# Patient Record
Sex: Male | Born: 1945
Health system: Southern US, Community
[De-identification: ages and names within clinical notes are randomized; demographics above are authoritative.]

## PROBLEM LIST (undated history)

## (undated) DIAGNOSIS — F17200 Nicotine dependence, unspecified, uncomplicated: Secondary | ICD-10-CM

## (undated) DIAGNOSIS — M545 Low back pain, unspecified: Secondary | ICD-10-CM

## (undated) DIAGNOSIS — K5790 Diverticulosis of intestine, part unspecified, without perforation or abscess without bleeding: Secondary | ICD-10-CM

## (undated) DIAGNOSIS — J449 Chronic obstructive pulmonary disease, unspecified: Secondary | ICD-10-CM

## (undated) DIAGNOSIS — I351 Nonrheumatic aortic (valve) insufficiency: Secondary | ICD-10-CM

## (undated) DIAGNOSIS — Z8679 Personal history of other diseases of the circulatory system: Secondary | ICD-10-CM

## (undated) DIAGNOSIS — N400 Enlarged prostate without lower urinary tract symptoms: Secondary | ICD-10-CM

## (undated) DIAGNOSIS — I10 Essential (primary) hypertension: Secondary | ICD-10-CM

## (undated) DIAGNOSIS — H269 Unspecified cataract: Secondary | ICD-10-CM

## (undated) DIAGNOSIS — N529 Male erectile dysfunction, unspecified: Secondary | ICD-10-CM

## (undated) DIAGNOSIS — G629 Polyneuropathy, unspecified: Secondary | ICD-10-CM

## (undated) DIAGNOSIS — H5440 Blindness, one eye, unspecified eye: Secondary | ICD-10-CM

## (undated) HISTORY — DX: Male erectile dysfunction, unspecified: N52.9

## (undated) HISTORY — DX: Chronic obstructive pulmonary disease, unspecified: J44.9

## (undated) HISTORY — DX: Unspecified cataract: H26.9

## (undated) HISTORY — DX: Diverticulosis of intestine, part unspecified, without perforation or abscess without bleeding: K57.90

## (undated) HISTORY — DX: Personal history of other diseases of the circulatory system: Z86.79

## (undated) HISTORY — DX: Low back pain, unspecified: M54.50

## (undated) HISTORY — DX: Benign prostatic hyperplasia without lower urinary tract symptoms: N40.0

## (undated) HISTORY — DX: Low back pain: M54.5

## (undated) HISTORY — DX: Essential (primary) hypertension: I10

## (undated) HISTORY — DX: Nicotine dependence, unspecified, uncomplicated: F17.200

## (undated) HISTORY — PX: ELBOW SURGERY: SHX618

## (undated) HISTORY — DX: Nonrheumatic aortic (valve) insufficiency: I35.1

## (undated) HISTORY — DX: Blindness, one eye, unspecified eye: H54.40

## (undated) HISTORY — DX: Polyneuropathy, unspecified: G62.9

---

## 1983-05-25 DIAGNOSIS — H5440 Blindness, one eye, unspecified eye: Secondary | ICD-10-CM

## 1983-05-25 HISTORY — DX: Blindness, one eye, unspecified eye: H54.40

## 1998-05-24 HISTORY — PX: COLON SURGERY: SHX602

## 1998-07-26 ENCOUNTER — Inpatient Hospital Stay (HOSPITAL_COMMUNITY): Admission: EM | Admit: 1998-07-26 | Discharge: 1998-07-31 | Payer: Self-pay | Admitting: *Deleted

## 1998-07-26 ENCOUNTER — Encounter: Payer: Self-pay | Admitting: *Deleted

## 1998-07-27 ENCOUNTER — Encounter: Payer: Self-pay | Admitting: *Deleted

## 1998-07-29 ENCOUNTER — Encounter: Payer: Self-pay | Admitting: Surgery

## 1998-10-08 ENCOUNTER — Encounter: Payer: Self-pay | Admitting: Surgery

## 1998-10-10 ENCOUNTER — Inpatient Hospital Stay (HOSPITAL_COMMUNITY): Admission: RE | Admit: 1998-10-10 | Discharge: 1998-10-14 | Payer: Self-pay | Admitting: Surgery

## 2001-08-28 ENCOUNTER — Encounter: Admission: RE | Admit: 2001-08-28 | Discharge: 2001-08-28 | Payer: Self-pay | Admitting: Family Medicine

## 2001-08-28 ENCOUNTER — Encounter: Payer: Self-pay | Admitting: Family Medicine

## 2003-09-23 ENCOUNTER — Ambulatory Visit (HOSPITAL_COMMUNITY): Admission: RE | Admit: 2003-09-23 | Discharge: 2003-09-23 | Payer: Self-pay | Admitting: Family Medicine

## 2004-08-02 ENCOUNTER — Emergency Department (HOSPITAL_COMMUNITY): Admission: EM | Admit: 2004-08-02 | Discharge: 2004-08-02 | Payer: Self-pay | Admitting: Emergency Medicine

## 2004-08-05 ENCOUNTER — Ambulatory Visit (HOSPITAL_COMMUNITY): Admission: RE | Admit: 2004-08-05 | Discharge: 2004-08-05 | Payer: Self-pay | Admitting: Emergency Medicine

## 2006-05-18 ENCOUNTER — Emergency Department (HOSPITAL_COMMUNITY): Admission: EM | Admit: 2006-05-18 | Discharge: 2006-05-19 | Payer: Self-pay | Admitting: Emergency Medicine

## 2012-10-12 ENCOUNTER — Other Ambulatory Visit: Payer: Self-pay | Admitting: Gastroenterology

## 2012-12-27 ENCOUNTER — Ambulatory Visit (INDEPENDENT_AMBULATORY_CARE_PROVIDER_SITE_OTHER): Payer: Medicare Other | Admitting: Internal Medicine

## 2012-12-27 ENCOUNTER — Encounter: Payer: Self-pay | Admitting: Internal Medicine

## 2012-12-27 ENCOUNTER — Ambulatory Visit (INDEPENDENT_AMBULATORY_CARE_PROVIDER_SITE_OTHER)
Admission: RE | Admit: 2012-12-27 | Discharge: 2012-12-27 | Disposition: A | Discharge status: Home / Self Care | Payer: Medicare Other | Source: Ambulatory Visit | Attending: Internal Medicine | Admitting: Internal Medicine

## 2012-12-27 VITALS — BP 130/80 | HR 73 | Temp 98.0°F | Ht 70.0 in | Wt 173.8 lb

## 2012-12-27 DIAGNOSIS — J449 Chronic obstructive pulmonary disease, unspecified: Secondary | ICD-10-CM

## 2012-12-27 DIAGNOSIS — J441 Chronic obstructive pulmonary disease with (acute) exacerbation: Secondary | ICD-10-CM | POA: Insufficient documentation

## 2012-12-27 MED ORDER — BUDESONIDE-FORMOTEROL FUMARATE 160-4.5 MCG/ACT IN AERO: INHALATION_SPRAY | RESPIRATORY_TRACT | Status: DC

## 2012-12-27 NOTE — Patient Instructions (Addendum)
symbicort 160 Take 2 puffs first thing in am and then another 2 puffs about 12 hours later.   Only use your albuterol (proaire) as a rescue medication to be used if you can't catch your breath by resting or doing a relaxed purse lip breathing pattern. The less you use it, the better it will work when you need it.   As you improve ok to stop spiriva   Please remember to go to the  x-ray department downstairs for your tests - we will call you with the results when they are available.    See your dentist as soon as possible   Please schedule a follow up office visit in 6 weeks, call sooner if needed with pft's

## 2012-12-27 NOTE — Progress Notes (Signed)
  Subjective:    Patient ID: Joel Perez, male    DOB: May 17, 1946  MRN: [id number]  HPI  37 yowm quit smoking 12/2012 referred by Dr Azucena Cecil for eval of copd  12/27/2012 1st pulmonary eval/ Wert cc onset x 2009 am cough cough/ congestion and progresive doe with white mucus x sev tbsp x sev min better p proaire and maintained on spiriva which he feels doesn't work as well as advair. Doe x one flight steps, dragging the garbage to street stops x 2. He denies cough to me but actually has a rattling with cough maneuver but doesn't bring up much mucus, what he does bring up is mostly in ams and mucoid and some better since quit smoking   No obvious daytime variabilty  Or cp or chest tightness, subjective wheeze overt sinus or hb symptoms. No unusual exp hx or h/o childhood pna/ asthma or knowledge of premature birth.   Sleeping ok in recliner  without nocturnal  or early am exacerbation  of respiratory  c/o's or need for noct saba. Also denies any obvious fluctuation of symptoms with weather or environmental changes or other aggravating or alleviating factors except as outlined above    Review of Systems  Constitutional: Positive for appetite change. Negative for fever, chills, activity change and unexpected weight change.  HENT: Positive for congestion. Negative for sore throat, rhinorrhea, sneezing, trouble swallowing, dental problem, voice change and postnasal drip.   Eyes: Negative for visual disturbance.  Respiratory: Positive for cough and shortness of breath. Negative for choking.   Cardiovascular: Negative for chest pain and leg swelling.  Gastrointestinal: Negative for nausea, vomiting and abdominal pain.  Genitourinary: Negative for difficulty urinating.  Musculoskeletal: Negative for arthralgias.  Skin: Negative for rash.  Psychiatric/Behavioral: Negative for behavioral problems and confusion.       Objective:   Physical Exam   Wt Readings from Last 3 Encounters:  12/27/12 173 lb  12.8 oz (78.835 kg)   HEENT mild turbinate edema.  Extremely poor dentition  Oropharynx no thrush or excess pnd or cobblestoning.  No JVD or cervical adenopathy. Mild accessory muscle hypertrophy. Trachea midline, nl thryroid. Chest was hyperinflated by percussion with diminished breath sounds and moderate increased exp time without wheeze. Hoover sign positive at mid inspiration. Regular rate and rhythm without murmur gallop or rub or increase P2 or edema.  Abd: no hsm, nl excursion. Ext warm without cyanosis or clubbing.      CXR  12/27/2012 :  Small left pleural effusion or pleural scarring. Otherwise no acute cardiopulmonary abnormality      Assessment & Plan:

## 2012-12-28 NOTE — Assessment & Plan Note (Signed)
DDX of  difficult airways managment all start with A and  include Adherence, Ace Inhibitors, Acid Reflux, Active Sinus Disease, Alpha 1 Antitripsin deficiency, Anxiety masquerading as Airways dz,  ABPA,  allergy(esp in young), Aspiration (esp in elderly), Adverse effects of DPI,  Active smokers, plus two Bs  = Bronchiectasis and Beta blocker use..and one C= CHF  Adherence is always the initial "prime suspect" and is a multilayered concern that requires a "trust but verify" approach in every patient - starting with knowing how to use medications, especially inhalers, correctly, keeping up with refills and understanding the fundamental difference between maintenance and prns vs those medications only taken for a very short course and then stopped and not refilled. The proper method of use, as well as anticipated side effects, of a metered-dose inhaler are discussed and demonstrated to the patient. Improved effectiveness after extensive coaching during this visit to a level of approximately  90% so rec trial of symbicort 160 2bid   Active smoking > denies as of 12/25/12  I took an extended  opportunity with this patient to outline the consequences of continued cigarette use  in airway disorders based on all the data we have from the multiple national lung health studies (perfomed over decades at millions of dollars in cost)  indicating that maintaining smoking cessation, not choice of inhalers or physicians, is the most important aspect of care.

## 2012-12-28 NOTE — Progress Notes (Signed)
Quick Note:  Spoke with pt and notified of results per Dr. Wert. Pt verbalized understanding and denied any questions.  ______ 

## 2013-02-09 ENCOUNTER — Ambulatory Visit (INDEPENDENT_AMBULATORY_CARE_PROVIDER_SITE_OTHER): Payer: BC Managed Care – PPO | Admitting: Internal Medicine

## 2013-02-09 ENCOUNTER — Encounter: Payer: Self-pay | Admitting: Internal Medicine

## 2013-02-09 VITALS — BP 122/78 | HR 66 | Temp 97.8°F | Ht 69.0 in | Wt 177.0 lb

## 2013-02-09 DIAGNOSIS — J449 Chronic obstructive pulmonary disease, unspecified: Secondary | ICD-10-CM

## 2013-02-09 LAB — PULMONARY FUNCTION TEST

## 2013-02-09 MED ORDER — ALBUTEROL SULFATE HFA 108 (90 BASE) MCG/ACT IN AERS: 2.0000 | INHALATION_SPRAY | Freq: Four times a day (QID) | RESPIRATORY_TRACT | Status: DC | PRN

## 2013-02-09 NOTE — Progress Notes (Signed)
Subjective:    Patient ID: Joel Perez, male    DOB: 1946/04/27  MRN: [id number]    Brief patient profile:  27 yowm quit smoking 12/2012 referred by Dr Azucena Cecil for eval of copd with GOLD II criteria established 02/09/13    History of Present Illness  12/27/2012 1st pulmonary eval/ Wert cc onset x 2009 am cough cough/ congestion and progresive doe with white mucus x sev tbsp x sev min better p proaire and maintained on spiriva which he feels doesn't work as well as advair. Doe x one flight steps, dragging the garbage to street stops x 2. He denies cough to me but actually has a rattling with cough maneuver but doesn't bring up much mucus, what he does bring up is mostly in ams and mucoid and some better since quit smoking  rec symbicort 160 Take 2 puffs first thing in am and then another 2 puffs about 12 hours later.  Only use your albuterol (proaire) as a rescue medication to be used if you can't catch your breath by resting or doing a relaxed purse lip breathing pattern. The less you use it, the better it will work when you need it.  As you improve ok to stop spiriva  Please schedule a follow up office visit in 6 weeks, call sooner if needed with pft's   02/09/2013 f/u ov/Wert re: GOLD II with reversibilty Chief Complaint  Patient presents with  . Follow-up    W/ PFT. Pt reports his breathing is better. He gets up tan color phlem up right after breathing tx  still rattling some but much better.    No obvious daytime variabilty  Or cp or chest tightness, subjective wheeze overt sinus or hb symptoms. No unusual exp hx or h/o childhood pna/ asthma or knowledge of premature birth.   Sleeping ok in recliner  without nocturnal  or early am exacerbation  of respiratory  c/o's or need for noct saba. Also denies any obvious fluctuation of symptoms with weather or environmental changes or other aggravating or alleviating factors except as outlined above   Current Medications, Allergies, Complete  Past Medical History, Past Surgical History, Family History, and Social History were reviewed in [employer] record.  ROS  The following are not active complaints unless bolded sore throat, dysphagia, dental problems, itching, sneezing,  nasal congestion or excess/ purulent secretions, ear ache,   fever, chills, sweats, unintended wt loss, pleuritic or exertional cp, hemoptysis,  orthopnea pnd or leg swelling, presyncope, palpitations, heartburn, abdominal pain, anorexia, nausea, vomiting, diarrhea  or change in bowel or urinary habits, change in stools or urine, dysuria,hematuria,  rash, arthralgias, visual complaints, headache, numbness weakness or ataxia or problems with walking or coordination,  change in mood/affect or memory.             Objective:   Physical Exam Wt Readings from Last 3 Encounters:  02/09/13 177 lb (80.287 kg)  12/27/12 173 lb 12.8 oz (78.835 kg)      HEENT mild turbinate edema.  Extremely poor dentition  Oropharynx no thrush or excess pnd or cobblestoning.  No JVD or cervical adenopathy. Mild accessory muscle hypertrophy. Trachea midline, nl thryroid. Chest was hyperinflated by percussion with diminished breath sounds and moderate increased exp time without wheeze. Hoover sign positive at mid inspiration. Regular rate and rhythm without murmur gallop or rub or increase P2 or edema.  Abd: no hsm, nl excursion. Ext warm without cyanosis or clubbing.  CXR  12/27/2012 :  Small left pleural effusion or pleural scarring. Otherwise no acute cardiopulmonary abnormality      Assessment & Plan:

## 2013-02-09 NOTE — Patient Instructions (Addendum)
Continue symbicort 160 Take 2 puffs first thing in am and then another 2 puffs about 12 hours later.   Only use your proair as a rescue medication to be used if you can't catch your breath by resting or doing a relaxed purse lip breathing pattern. The less you use it, the better it will work when you need it.  Don't leave home without it  Ok to leave off spiriva    If you are satisfied with your treatment plan let your doctor know and he/she can either refill your medications or you can return here when your prescription runs out.     If in any way you are not 100% satisfied,  please tell us.  If 100% better, tell your friends!

## 2013-02-09 NOTE — Progress Notes (Signed)
PFT done today. 

## 2013-02-11 NOTE — Assessment & Plan Note (Addendum)
quit smoking 12/2012 - 02/09/2013  FEV1 2.14 (68%) ratio 60 p 30% better from B2 and dloc 52 corrects to 69  The proper method of use, as well as anticipated side effects, of a metered-dose inhaler are discussed and demonstrated to the patient. Improved effectiveness after extensive coaching during this visit to a level of approximately  90% so should do well on just symbicort 160 2bid but ok to add or subtract spiriva if persistent limiting sob .  I reviewed the Flethcher curve with patient that basically indicates  if you quit smoking when your best day FEV1 is still well preserved (which his clearly is)  it is highly unlikely you will progress to severe disease and informed the patient there was no medication on the market that has proven to change the curve or the likelihood of progression.  Therefore  maintaining smoking abstinence at this point is the most important aspect of his care, not choice of inhalers or for that matter, doctors.    Pulmonary f/u is therefore prn

## 2013-12-22 DIAGNOSIS — I351 Nonrheumatic aortic (valve) insufficiency: Secondary | ICD-10-CM

## 2013-12-22 HISTORY — DX: Nonrheumatic aortic (valve) insufficiency: I35.1

## 2013-12-27 ENCOUNTER — Other Ambulatory Visit: Payer: Self-pay | Admitting: Family Medicine

## 2013-12-27 DIAGNOSIS — F17200 Nicotine dependence, unspecified, uncomplicated: Secondary | ICD-10-CM

## 2014-01-15 ENCOUNTER — Ambulatory Visit
Admission: RE | Admit: 2014-01-15 | Discharge: 2014-01-15 | Disposition: A | Discharge status: Home / Self Care | Payer: Medicare Other | Source: Ambulatory Visit | Attending: Family Medicine | Admitting: Family Medicine

## 2014-01-15 ENCOUNTER — Other Ambulatory Visit: Payer: Self-pay | Admitting: Internal Medicine

## 2014-01-15 DIAGNOSIS — F17200 Nicotine dependence, unspecified, uncomplicated: Secondary | ICD-10-CM

## 2014-01-18 ENCOUNTER — Encounter (INDEPENDENT_AMBULATORY_CARE_PROVIDER_SITE_OTHER): Payer: Self-pay

## 2014-01-18 ENCOUNTER — Ambulatory Visit (INDEPENDENT_AMBULATORY_CARE_PROVIDER_SITE_OTHER): Payer: Medicare Other | Admitting: Internal Medicine

## 2014-01-18 ENCOUNTER — Encounter: Payer: Self-pay | Admitting: Internal Medicine

## 2014-01-18 ENCOUNTER — Ambulatory Visit (INDEPENDENT_AMBULATORY_CARE_PROVIDER_SITE_OTHER)
Admission: RE | Admit: 2014-01-18 | Discharge: 2014-01-18 | Disposition: A | Discharge status: Home / Self Care | Payer: Medicare Other | Source: Ambulatory Visit | Attending: Internal Medicine | Admitting: Internal Medicine

## 2014-01-18 VITALS — BP 134/72 | HR 75 | Temp 98.0°F | Ht 68.0 in | Wt 179.0 lb

## 2014-01-18 DIAGNOSIS — F17200 Nicotine dependence, unspecified, uncomplicated: Secondary | ICD-10-CM

## 2014-01-18 DIAGNOSIS — J449 Chronic obstructive pulmonary disease, unspecified: Secondary | ICD-10-CM

## 2014-01-18 DIAGNOSIS — F17210 Nicotine dependence, cigarettes, uncomplicated: Secondary | ICD-10-CM

## 2014-01-18 DIAGNOSIS — J4489 Other specified chronic obstructive pulmonary disease: Secondary | ICD-10-CM

## 2014-01-18 NOTE — Patient Instructions (Addendum)
Symbicort 160 Take 2 puffs first thing in am and then another 2 puffs about 12 hours later.   Only use your albuterol (proair)  as a rescue medication to be used if you can't catch your breath by resting or doing a relaxed purse lip breathing pattern.  - The less you use it, the better it will work when you need it. - Ok to use up to 2 puffs  every 4 hours if you must but call for immediate appointment if use goes up over your usual need - Don't leave home without it !!  (think of it like the spare tire for your car)   The key is to stop smoking completely before smoking completely stops you!   Please remember to go to the  x-ray department downstairs for your tests - we will call you with the results when they are available.

## 2014-01-18 NOTE — Progress Notes (Signed)
Subjective:    Patient ID: Joel Perez, male    DOB: 07/04/1945   MRN: [id number]    Brief patient profile:  6 yowm active smoker referred by Dr Moreen Fowler for eval of copd with GOLD II criteria established 02/09/13    History of Present Illness  12/27/2012 1st pulmonary eval/ Wert cc onset x 2009 am cough cough/ congestion and progresive doe with white mucus x sev tbsp x sev min better p proaire and maintained on spiriva which he feels doesn't work as well as advair. Doe x one flight steps, dragging the garbage to street stops x 2. He denies cough to me but actually has a rattling with cough maneuver but doesn't bring up much mucus, what he does bring up is mostly in ams and mucoid and some better since quit smoking  rec symbicort 160 Take 2 puffs first thing in am and then another 2 puffs about 12 hours later.  Only use your albuterol (proaire) as a rescue medication to be used if you can't catch your breath by resting or doing a relaxed purse lip breathing pattern. The less you use it, the better it will work when you need it.  As you improve ok to stop spiriva  Please schedule a follow up office visit in 6 weeks, call sooner if needed with pft's   02/09/2013 f/u ov/Wert re: GOLD II with reversibilty Chief Complaint  Patient presents with  . Follow-up    W/ PFT. Pt reports his breathing is better. He gets up tan color phlem up right after breathing tx  still rattling some but much better.  rec Continue symbicort 160 Take 2 puffs first thing in am and then another 2 puffs about 12 hours later.  Only use your proair as a rescue medication to be used if you can't catch your breath by resting or doing a relaxed purse lip breathing pattern. The less you use it, the better it will work when you need it.  Don't leave home without it Ok to leave off spiriva     01/18/2014 f/u ov/Wert re: copd GOLD II with  Reversibility/ resumed smoking  Chief Complaint  Patient presents with  . Follow-up   Pt states that his breathing is doing well. He is using rescue inhaler approx 2 x per day.   not taking symbicort perfectly  regularly but doing well overall and actually has no limitiations with daily activities with somewhat rattling congested cough chronically in ams but no purulent mucus.    No obvious day to day or daytime variabilty or assoc  cp or chest tightness, subjective wheeze overt sinus or hb symptoms. No unusual exp hx or h/o childhood pna/ asthma or knowledge of premature birth.  Sleeping ok without nocturnal  or early am exacerbation  of respiratory  c/o's or need for noct saba. Also denies any obvious fluctuation of symptoms with weather or environmental changes or other aggravating or alleviating factors except as outlined above   Current Medications, Allergies, Complete Past Medical History, Past Surgical History, Family History, and Social History were reviewed in [employer] record.  ROS  The following are not active complaints unless bolded sore throat, dysphagia, dental problems, itching, sneezing,  nasal congestion or excess/ purulent secretions, ear ache,   fever, chills, sweats, unintended wt loss, pleuritic or exertional cp, hemoptysis,  orthopnea pnd or leg swelling, presyncope, palpitations, heartburn, abdominal pain, anorexia, nausea, vomiting, diarrhea  or change in bowel or urinary habits, change  in stools or urine, dysuria,hematuria,  rash, arthralgias, visual complaints, headache, numbness weakness or ataxia or problems with walking or coordination,  change in mood/affect or memory.                    Objective:   Physical Exam  01/18/2014       179  Wt Readings from Last 3 Encounters:  02/09/13 177 lb (80.287 kg)  12/27/12 173 lb 12.8 oz (78.835 kg)      HEENT mild turbinate edema.  Extremely poor dentition  Oropharynx no thrush or excess pnd or cobblestoning.  No JVD or cervical adenopathy. Mild accessory muscle hypertrophy.  Trachea midline, nl thryroid. Chest was hyperinflated by percussion with diminished breath sounds and moderate increased exp time without wheeze. Hoover sign positive at mid inspiration. Regular rate and rhythm without murmur gallop or rub or increase P2 or edema.  Abd: no hsm, nl excursion. Ext warm without cyanosis or clubbing.     01/18/2014 cxr  Stable lung volumes. Normal cardiac size and mediastinal contours. Visualized tracheal air column is within normal limits. Stable increased interstitial markings with basilar predominance. Stable mild apical scarring. No pneumothorax or pulmonary edema. No pleural effusion or acute pulmonary opacity. No acute osseous abnormality identified.      Assessment & Plan:

## 2014-01-19 DIAGNOSIS — F17210 Nicotine dependence, cigarettes, uncomplicated: Secondary | ICD-10-CM | POA: Insufficient documentation

## 2014-01-19 NOTE — Assessment & Plan Note (Signed)

## 2014-01-19 NOTE — Assessment & Plan Note (Signed)
02/09/2013  FEV1 2.14 (68%) ratio 60 p 30% better from B2 and dloc 52 corrects to 69  Adequate control on present rx, reviewed > no change in rx needed    The proper method of use, as well as anticipated side effects, of a metered-dose inhaler are discussed and demonstrated to the patient. Improved effectiveness after extensive coaching during this visit to a level of approximately  90%     Each maintenance medication was reviewed in detail including most importantly the difference between maintenance and as needed and under what circumstances the prns are to be used.  Please see instructions for details which were reviewed in writing and the patient given a copy.

## 2014-01-20 ENCOUNTER — Other Ambulatory Visit: Payer: Self-pay | Admitting: Internal Medicine

## 2014-01-21 ENCOUNTER — Other Ambulatory Visit: Payer: Self-pay | Admitting: Internal Medicine

## 2014-01-21 MED ORDER — ALBUTEROL SULFATE HFA 108 (90 BASE) MCG/ACT IN AERS: 2.0000 | INHALATION_SPRAY | Freq: Four times a day (QID) | RESPIRATORY_TRACT | Status: DC | PRN

## 2014-01-21 NOTE — Progress Notes (Signed)
Quick Note:  Spoke with pt and notified of results per Dr. Wert. Pt verbalized understanding and denied any questions.  ______ 

## 2014-10-13 ENCOUNTER — Other Ambulatory Visit: Payer: Self-pay | Admitting: Internal Medicine

## 2014-10-14 ENCOUNTER — Telehealth: Payer: Self-pay | Admitting: Internal Medicine

## 2014-10-14 NOTE — Telephone Encounter (Signed)
Rx has been sent in already. Pt is aware. Nothing further was needed.

## 2014-11-29 ENCOUNTER — Telehealth: Payer: Self-pay | Admitting: Internal Medicine

## 2014-11-29 ENCOUNTER — Other Ambulatory Visit: Payer: Self-pay | Admitting: Internal Medicine

## 2014-11-29 MED ORDER — ALBUTEROL SULFATE HFA 108 (90 BASE) MCG/ACT IN AERS: INHALATION_SPRAY | RESPIRATORY_TRACT | Status: DC

## 2014-11-29 NOTE — Telephone Encounter (Signed)
Spoke with patient, advised him that he is due for appointment at the end of August.  Patient says he will call back to schedule appointment when he gets his work schedule for August, he works part time still and will have to see what days he has off Rx sent to pharmacy. Patient notified. Nothing further needed.

## 2015-01-28 ENCOUNTER — Telehealth: Payer: Self-pay | Admitting: Internal Medicine

## 2015-01-28 MED ORDER — ALBUTEROL SULFATE HFA 108 (90 BASE) MCG/ACT IN AERS: INHALATION_SPRAY | RESPIRATORY_TRACT | Status: DC

## 2015-01-28 NOTE — Telephone Encounter (Signed)
Left message for patient stating that refill request has been taken care of and to keep OV 02-07-15 with MW for further refills.

## 2015-02-07 ENCOUNTER — Ambulatory Visit (INDEPENDENT_AMBULATORY_CARE_PROVIDER_SITE_OTHER): Payer: Medicare Other | Admitting: Internal Medicine

## 2015-02-07 ENCOUNTER — Encounter: Payer: Self-pay | Admitting: Internal Medicine

## 2015-02-07 ENCOUNTER — Ambulatory Visit (INDEPENDENT_AMBULATORY_CARE_PROVIDER_SITE_OTHER)
Admission: RE | Admit: 2015-02-07 | Discharge: 2015-02-07 | Disposition: A | Discharge status: Home / Self Care | Payer: Medicare Other | Source: Ambulatory Visit | Attending: Internal Medicine | Admitting: Internal Medicine

## 2015-02-07 VITALS — BP 154/90 | HR 94 | Ht 68.0 in | Wt 189.6 lb

## 2015-02-07 DIAGNOSIS — J449 Chronic obstructive pulmonary disease, unspecified: Secondary | ICD-10-CM

## 2015-02-07 DIAGNOSIS — F17210 Nicotine dependence, cigarettes, uncomplicated: Secondary | ICD-10-CM

## 2015-02-07 DIAGNOSIS — Z720 Tobacco use: Secondary | ICD-10-CM

## 2015-02-07 NOTE — Patient Instructions (Addendum)
Please remember to go to the  x-ray department downstairs for your tests - we will call you with the results when they are available.    The key is to stop smoking completely before smoking completely stops you!    Please schedule a follow up office visit in 6 weeks, call sooner if needed with pfts on return

## 2015-02-07 NOTE — Progress Notes (Signed)
Subjective:    Patient ID: Joel Perez, male    DOB: Mar 30, 1946   MRN: [id number]    Brief patient profile:  17 yowm active smoker referred by Dr Moreen Fowler for eval of copd with GOLD II criteria established 02/09/13    History of Present Illness  12/27/2012 1st pulmonary eval/ Wert cc onset x 2009 am cough cough/ congestion and progresive doe with white mucus x sev tbsp x sev min better p proaire and maintained on spiriva which he feels doesn't work as well as advair. Doe x one flight steps, dragging the garbage to street stops x 2. He denies cough to me but actually has a rattling with cough maneuver but doesn't bring up much mucus, what he does bring up is mostly in ams and mucoid and some better since quit smoking  rec symbicort 160 Take 2 puffs first thing in am and then another 2 puffs about 12 hours later.  Only use your albuterol (proaire) as a rescue medication to be used if you can't catch your breath by resting or doing a relaxed purse lip breathing pattern. The less you use it, the better it will work when you need it.  As you improve ok to stop spiriva  Please schedule a follow up office visit in 6 weeks, call sooner if needed with pft's   02/09/2013 f/u ov/Wert re: GOLD II with reversibilty Chief Complaint  Patient presents with  . Follow-up    W/ PFT. Pt reports his breathing is better. He gets up tan color phlem up right after breathing tx  still rattling some but much better.  rec Continue symbicort 160 Take 2 puffs first thing in am and then another 2 puffs about 12 hours later.  Only use your proair as a rescue medication to be used if you can't catch your breath by resting or doing a relaxed purse lip breathing pattern. The less you use it, the better it will work when you need it.  Don't leave home without it Ok to leave off spiriva     01/18/2014 f/u ov/Wert re: copd GOLD II with  Reversibility/ resumed smoking  Chief Complaint  Patient presents with  . Follow-up   Pt states that his breathing is doing well. He is using rescue inhaler approx 2 x per day.   not taking symbicort perfectly  regularly but doing well overall and actually has no limitiations with daily activities with somewhat rattling congested cough chronically in ams but no purulent mucus. rec Symbicort 160 Take 2 puffs first thing in am and then another 2 puffs about 12 hours later.  Only use your albuterol (proair)  as a rescue medication  The key is to stop smoking completely before smoking completely stops you!     02/07/2015 f/u ov/Wert re: COPD GOLD II with reversibility/ still smoking / maint on symbicort Chief Complaint  Patient presents with  . Follow-up    Pt here for yearly f/u. Pt states his breathing has worsened has worsened since last OV. Pt states he is smoking half a pack per day. Pt c/o increase in DOE - heat and humidity making it worse, prod cough light brown mucus in morning then clear through out the day - this is pt's baseline. Pt deneis CP/tightness.    Am cough/congestoin x sev hours / saba helps some dyspnea on exertion = MMRC1 = can walk nl pace, flat grade, can't hurry or go up hills or steps s sob  No obvious day to day or daytime variabilty or assoc  cp or chest tightness, subjective wheeze overt sinus or hb symptoms. No unusual exp hx or h/o childhood pna/ asthma or knowledge of premature birth.  Sleeping ok without nocturnal  or early am exacerbation  of respiratory  c/o's or need for noct saba. Also denies any obvious fluctuation of symptoms with weather or environmental changes or other aggravating or alleviating factors except as outlined above   Current Medications, Allergies, Complete Past Medical History, Past Surgical History, Family History, and Social History were reviewed in [employer] record.  ROS  The following are not active complaints unless bolded sore throat, dysphagia, dental problems, itching, sneezing,  nasal  congestion or excess/ purulent secretions, ear ache,   fever, chills, sweats, unintended wt loss, pleuritic or exertional cp, hemoptysis,  orthopnea pnd or leg swelling, presyncope, palpitations, heartburn, abdominal pain, anorexia, nausea, vomiting, diarrhea  or change in bowel or urinary habits, change in stools or urine, dysuria,hematuria,  rash, arthralgias, visual complaints, headache, numbness weakness or ataxia or problems with walking or coordination,  change in mood/affect or memory.              Objective:   Physical Exam  01/18/2014       179 > 02/07/2015 190  Wt Readings from Last 3 Encounters:  02/09/13 177 lb (80.287 kg)  12/27/12 173 lb 12.8 oz (78.835 kg)      HEENT mild turbinate edema.  Extremely poor lower dentition / upper edentulous/  Oropharynx no thrush or excess pnd or cobblestoning.  No JVD or cervical adenopathy. Mild accessory muscle hypertrophy. Trachea midline, nl thryroid. Chest was hyperinflated by percussion with diminished breath sounds and moderate increased exp time without wheeze. Hoover sign positive at mid inspiration. Regular rate and rhythm without murmur gallop or rub or increase P2 or edema.  Abd: no hsm, nl excursion. Ext warm without cyanosis or clubbing.       CXR PA and Lateral:   02/07/2015 :     I personally reviewed images and agree with radiology impression as follows:   No acute cardiopulmonary disease. Stable appearance from the prior study       Assessment & Plan:

## 2015-02-07 NOTE — Assessment & Plan Note (Signed)
>   3 m  Discussed the risks and costs (both direct and indirect)  of smoking relative to the benefits of quitting but patient unwilling to commit at this point to a specific quit date.    Offered to help with quitting  :  referral to our [employer] when the patient is ready.

## 2015-02-09 ENCOUNTER — Encounter: Payer: Self-pay | Admitting: Internal Medicine

## 2015-02-09 NOTE — Assessment & Plan Note (Addendum)
-   02/09/2013  FEV1 2.14 (68%) ratio 60 p 30% better from B2 and dloc 52 corrects to 69 - 02/07/2015  Walked RA x 3 laps @ 185 ft each stopped due to  End of study, nl pace, no sob or desat    The proper method of use, as well as anticipated side effects, of a metered-dose inhaler are discussed and demonstrated to the patient. Improved effectiveness after extensive coaching during this visit to a level of approximately  90% from a baseline of 75%  He has only moderate disease by previous PFTs but prominent chronic bronchitic/ asthmatic features and is still smoking. According to the [facility] principal, as a result of the ongoing cig smoking he is still declining at an accelerated pace because he has not quit smoking and needs to return now for PFTs to compare to his previous study  2 y  ago  I had an extended discussion with the patient reviewing all relevant studies completed to date and  lasting 15 to 20 minutes of a 25 minute visit    Each maintenance medication was reviewed in detail including most importantly the difference between maintenance and prns and under what circumstances the prns are to be triggered using an action plan format that is not reflected in the computer generated alphabetically organized AVS.    Please see instructions for details which were reviewed in writing and the patient given a copy highlighting the part that I personally wrote and discussed at today's ov.

## 2015-02-10 NOTE — Progress Notes (Signed)
Quick Note:  Spoke with pt and notified of results per Dr. Wert. Pt verbalized understanding and denied any questions.  ______ 

## 2015-03-21 ENCOUNTER — Ambulatory Visit (INDEPENDENT_AMBULATORY_CARE_PROVIDER_SITE_OTHER): Payer: Medicare Other | Admitting: Internal Medicine

## 2015-03-21 ENCOUNTER — Encounter: Payer: Self-pay | Admitting: Internal Medicine

## 2015-03-21 VITALS — BP 134/86 | HR 95 | Ht 69.0 in | Wt 192.0 lb

## 2015-03-21 DIAGNOSIS — J449 Chronic obstructive pulmonary disease, unspecified: Secondary | ICD-10-CM

## 2015-03-21 DIAGNOSIS — F17210 Nicotine dependence, cigarettes, uncomplicated: Secondary | ICD-10-CM

## 2015-03-21 LAB — PULMONARY FUNCTION TEST
DL/VA % pred: 62 %
DL/VA: 2.82 ml/min/mmHg/L
DLCO unc % pred: 47 %
DLCO unc: 14.72 ml/min/mmHg
FEF 25-75 Post: 1.08 L/sec
FEF 25-75 Pre: 0.77 L/sec
FEF2575-%Change-Post: 39 %
FEF2575-%Pred-Post: 46 %
FEF2575-%Pred-Pre: 33 %
FEV1-%Change-Post: 14 %
FEV1-%Pred-Post: 62 %
FEV1-%Pred-Pre: 54 %
FEV1-Post: 1.92 L
FEV1-Pre: 1.68 L
FEV1FVC-%Change-Post: 4 %
FEV1FVC-%Pred-Pre: 78 %
FEV6-%Change-Post: 9 %
FEV6-%Pred-Post: 79 %
FEV6-%Pred-Pre: 72 %
FEV6-Post: 3.15 L
FEV6-Pre: 2.88 L
FEV6FVC-%Change-Post: 0 %
FEV6FVC-%Pred-Post: 103 %
FEV6FVC-%Pred-Pre: 104 %
FVC-%Change-Post: 9 %
FVC-%Pred-Post: 76 %
FVC-%Pred-Pre: 70 %
FVC-Post: 3.23 L
FVC-Pre: 2.94 L
Post FEV1/FVC ratio: 60 %
Post FEV6/FVC ratio: 97 %
Pre FEV1/FVC ratio: 57 %
Pre FEV6/FVC Ratio: 98 %

## 2015-03-21 MED ORDER — ALBUTEROL SULFATE HFA 108 (90 BASE) MCG/ACT IN AERS: INHALATION_SPRAY | RESPIRATORY_TRACT | Status: DC

## 2015-03-21 MED ORDER — TIOTROPIUM BROMIDE MONOHYDRATE 2.5 MCG/ACT IN AERS: INHALATION_SPRAY | RESPIRATORY_TRACT | Status: DC

## 2015-03-21 NOTE — Patient Instructions (Addendum)
Add spiriva respimat x 2 puffs each am only right after the symbicort 160   Only use your albuterol as a rescue medication to be used if you can't catch your breath by resting or doing a relaxed purse lip breathing pattern.  - The less you use it, the better it will work when you need it. - Ok to use up to 2 puffs  every 4 hours if you must but call for immediate appointment if use goes up over your usual need - Don't leave home without it !!  (think of it like the spare tire for your car)   The key is to stop smoking completely before smoking completely stops you - it's clearly not too late   Please schedule a follow up visit in 3 months but call sooner if needed

## 2015-03-21 NOTE — Progress Notes (Addendum)
Subjective:    Patient ID: JERIME ARIF, male    DOB: 1945/12/14   MRN: [id number]    Brief patient profile:  13 yowm active smoker referred by Dr Moreen Fowler for eval of copd with GOLD II criteria established 02/09/13    History of Present Illness  12/27/2012 1st pulmonary eval/ Wert cc onset x 2009 am cough cough/ congestion and progresive doe with white mucus x sev tbsp x sev min better p proaire and maintained on spiriva which he feels doesn't work as well as advair. Doe x one flight steps, dragging the garbage to street stops x 2. He denies cough to me but actually has a rattling with cough maneuver but doesn't bring up much mucus, what he does bring up is mostly in ams and mucoid and some better since quit smoking  rec symbicort 160 Take 2 puffs first thing in am and then another 2 puffs about 12 hours later.  Only use your albuterol (proaire) as a rescue medication to be used if you can't catch your breath by resting or doing a relaxed purse lip breathing pattern. The less you use it, the better it will work when you need it.  As you improve ok to stop spiriva  Please schedule a follow up office visit in 6 weeks, call sooner if needed with pft's   02/09/2013 f/u ov/Wert re: GOLD II with reversibilty Chief Complaint  Patient presents with  . Follow-up    W/ PFT. Pt reports his breathing is better. He gets up tan color phlem up right after breathing tx  still rattling some but much better.  rec Continue symbicort 160 Take 2 puffs first thing in am and then another 2 puffs about 12 hours later.  Only use your proair as a rescue medication to be used if you can't catch your breath by resting or doing a relaxed purse lip breathing pattern. The less you use it, the better it will work when you need it.  Don't leave home without it Ok to leave off spiriva     01/18/2014 f/u ov/Wert re: copd GOLD II with  Reversibility/ resumed smoking  Chief Complaint  Patient presents with  . Follow-up     Pt states that his breathing is doing well. He is using rescue inhaler approx 2 x per day.   not taking symbicort perfectly  regularly but doing well overall and actually has no limitiations with daily activities with somewhat rattling congested cough chronically in ams but no purulent mucus. rec Symbicort 160 Take 2 puffs first thing in am and then another 2 puffs about 12 hours later.  Only use your albuterol (proair)  as a rescue medication  The key is to stop smoking completely before smoking completely stops you!     02/07/2015 f/u ov/Wert re: COPD GOLD II with reversibility/ still smoking / maint on symbicort Chief Complaint  Patient presents with  . Follow-up    Pt here for yearly f/u. Pt states his breathing has worsened has worsened since last OV. Pt states he is smoking half a pack per day. Pt c/o increase in DOE - heat and humidity making it worse, prod cough light brown mucus in morning then clear through out the day - this is pt's baseline. Pt deneis CP/tightness.   Am cough/congestoin x sev hours / saba helps some dyspnea on exertion = MMRC1 = can walk nl pace, flat grade, can't hurry or go up hills or steps s sob  rec Symbicort 160 Take 2 puffs first thing in am and then another 2 puffs about 12 hours later.  Only use your albuterol (proair)  as a rescue medication  The key is to stop smoking completely before smoking completely stops you!     03/21/2015  f/u ov/Wert re: COPD III with reversibilty on symb 160 2bid / still smoking Chief Complaint  Patient presents with  . Follow-up    PFT done today. Breathing has improved since the last visit. He is usin proair 1-2 x per wk on average  Wants to be able to walk up inclines s sob   No obvious day to day or daytime variabilty or assoc excess/ purulent sputum production  or cp or chest tightness, subjective wheeze overt sinus or hb symptoms. No unusual exp hx or h/o childhood pna/ asthma or knowledge of premature  birth.  Sleeping ok without nocturnal  or early am exacerbation  of respiratory  c/o's or need for noct saba. Also denies any obvious fluctuation of symptoms with weather or environmental changes or other aggravating or alleviating factors except as outlined above   Current Medications, Allergies, Complete Past Medical History, Past Surgical History, Family History, and Social History were reviewed in [employer] record.  ROS  The following are not active complaints unless bolded sore throat, dysphagia, dental problems, itching, sneezing,  nasal congestion or excess/ purulent secretions, ear ache,   fever, chills, sweats, unintended wt loss, pleuritic or exertional cp, hemoptysis,  orthopnea pnd or leg swelling, presyncope, palpitations, heartburn, abdominal pain, anorexia, nausea, vomiting, diarrhea  or change in bowel or urinary habits, change in stools or urine, dysuria,hematuria,  rash, arthralgias, visual complaints, headache, numbness weakness or ataxia or problems with walking or coordination,  change in mood/affect or memory.              Objective:   Physical Exam  01/18/2014       179 > 02/07/2015 190 > 03/21/2015   192  Wt Readings from Last 3 Encounters:  02/09/13 177 lb (80.287 kg)  12/27/12 173 lb 12.8 oz (78.835 kg)    Elderly amb wm nad  / congested sounding rattling with voluntary cough/  Vital signs reviewed    HEENT mild turbinate edema.  Extremely poor lower dentition / upper edentulous/  Oropharynx no thrush or excess pnd or cobblestoning.  No JVD or cervical adenopathy. Mild accessory muscle hypertrophy. Trachea midline, nl thryroid. Chest was hyperinflated by percussion with diminished breath sounds and moderate increased exp time without wheeze. Hoover sign positive at mid inspiration. Regular rate and rhythm without murmur gallop or rub or increase P2 or edema.  Abd: no hsm, nl excursion. Ext warm without cyanosis or clubbing.       CXR PA and  Lateral:   02/07/2015 :     I personally reviewed images and agree with radiology impression as follows:   No acute cardiopulmonary disease. Stable appearance from the prior study       Assessment & Plan:

## 2015-03-21 NOTE — Progress Notes (Signed)
PFT done today. 

## 2015-03-22 NOTE — Assessment & Plan Note (Signed)
>   3 m I took an extended  opportunity with this patient to outline the consequences of continued cigarette use  in airway disorders based on all the data we have from the multiple national lung health studies (perfomed over decades at millions of dollars in cost)  indicating that smoking cessation, not choice of inhalers or physicians, is the most important aspect of care.    Suggested he try the e-cigarettes as a one-way bridge off of tobacco products

## 2015-03-22 NOTE — Assessment & Plan Note (Signed)
-   02/09/2013  FEV1 2.14 (68%) ratio 60 p 30% better from B2 and dloc 52 corrects to 69 - 02/07/2015  extensive coaching HFA effectiveness =    90% from a baseline of 75%  - 02/07/2015  Walked RA x 3 laps @ 185 ft each stopped due to  End of study, nl pace, no sob or desat   - PFT's  03/21/2015  FEV1 1.92 (62 % ) ratio 60  p 14 % improvement from saba with DLCO  47 % corrects to 62 % for alv volume   - trial of spiriva respimat 03/21/2015  With goal to be able to walk up inclines >>>  The proper method of use, as well as anticipated side effects, of a metered-dose inhaler are discussed and demonstrated to the patient. Improved effectiveness after extensive coaching during this visit to a level of approximately  90% with hfa and respimat   I reviewed his study in detail noting that he still has reversibility despite having used Symbicort the morning of this visit and his frustration that he cannot walk up inclines due to shortness of breath. Since he is artery on the LABA the next choice is to add a LAMA but of course neither will work well if he continues to smoke. See separate discussion  I had an extended discussion with the patient reviewing all relevant studies completed to date and  lasting 15 to 20 minutes of a 25 minute visit    Each maintenance medication was reviewed in detail including most importantly the difference between maintenance and prns and under what circumstances the prns are to be triggered using an action plan format that is not reflected in the computer generated alphabetically organized AVS.    Please see instructions for details which were reviewed in writing and the patient given a copy highlighting the part that I personally wrote and discussed at today's ov.

## 2015-04-11 ENCOUNTER — Telehealth: Payer: Self-pay | Admitting: Internal Medicine

## 2015-04-11 MED ORDER — BUDESONIDE-FORMOTEROL FUMARATE 160-4.5 MCG/ACT IN AERO: INHALATION_SPRAY | RESPIRATORY_TRACT | Status: DC

## 2015-04-11 NOTE — Telephone Encounter (Signed)
ATC PT. Received VM and it is full. WCB

## 2015-04-11 NOTE — Telephone Encounter (Signed)
Spoke with pt. Needs refill on symbicort. I have sent this in for him. Nothing further needed

## 2015-06-23 ENCOUNTER — Encounter: Payer: Self-pay | Admitting: Internal Medicine

## 2015-06-23 ENCOUNTER — Ambulatory Visit (INDEPENDENT_AMBULATORY_CARE_PROVIDER_SITE_OTHER): Payer: Medicare Other | Admitting: Internal Medicine

## 2015-06-23 VITALS — BP 120/80 | HR 78 | Ht 70.0 in | Wt 196.4 lb

## 2015-06-23 DIAGNOSIS — F17210 Nicotine dependence, cigarettes, uncomplicated: Secondary | ICD-10-CM

## 2015-06-23 DIAGNOSIS — J449 Chronic obstructive pulmonary disease, unspecified: Secondary | ICD-10-CM

## 2015-06-23 MED ORDER — GLYCOPYRROLATE-FORMOTEROL 9-4.8 MCG/ACT IN AERO: 2.0000 | INHALATION_SPRAY | Freq: Two times a day (BID) | RESPIRATORY_TRACT | Status: DC

## 2015-06-23 NOTE — Patient Instructions (Addendum)
Bevespi Take 2 puffs first thing in am and then another 2 puffs about 12 hours later and stop symbicort and spiriva for now   Only use your albuterol as a rescue medication to be used if you can't catch your breath by resting or doing a relaxed purse lip breathing pattern.  - The less you use it, the better it will work when you need it. - Ok to use up to 2 puffs  every 4 hours if you must but call for immediate appointment if use goes up over your usual need - Don't leave home without it !!  (think of it like the spare tire for your car)   The key is to stop smoking completely before smoking completely stops you- think of the e cigs as a one way bridge off all tobacco products  Please schedule a follow up visit in 3 months but call sooner if needed

## 2015-06-23 NOTE — Progress Notes (Signed)
Subjective:    Patient ID: Joel Perez, male    DOB: March 21, 1946   MRN: [id number]    Brief patient profile:  49 yowm active smoker referred by Dr Moreen Fowler for eval of copd with GOLD II criteria established 02/09/13    History of Present Illness  12/27/2012 1st pulmonary eval/ Wert cc onset x 2009 am cough cough/ congestion and progresive doe with white mucus x sev tbsp x sev min better p proaire and maintained on spiriva which he feels doesn't work as well as advair. Doe x one flight steps, dragging the garbage to street stops x 2. He denies cough to me but actually has a rattling with cough maneuver but doesn't bring up much mucus, what he does bring up is mostly in ams and mucoid and some better since quit smoking  rec symbicort 160 Take 2 puffs first thing in am and then another 2 puffs about 12 hours later.  Only use your albuterol (proaire) as a rescue medication to be used if you can't catch your breath by resting or doing a relaxed purse lip breathing pattern. The less you use it, the better it will work when you need it.  As you improve ok to stop spiriva  Please schedule a follow up office visit in 6 weeks, call sooner if needed with pft's   02/09/2013 f/u ov/Wert re: GOLD II with reversibilty Chief Complaint  Patient presents with  . Follow-up    W/ PFT. Pt reports his breathing is better. He gets up tan color phlem up right after breathing tx  still rattling some but much better.  rec Continue symbicort 160 Take 2 puffs first thing in am and then another 2 puffs about 12 hours later.  Only use your proair as a rescue medication to be used if you can't catch your breath by resting or doing a relaxed purse lip breathing pattern. The less you use it, the better it will work when you need it.  Don't leave home without it [place] to leave off spiriva     03/21/2015  f/u ov/Wert re: COPD III with reversibilty on symb 160 2bid / still smoking Chief Complaint  Patient presents with  .  Follow-up    PFT done today. Breathing has improved since the last visit. He is usin proair 1-2 x per wk on average  Wants to be able to walk up inclines s sob  rec Add spiriva respimat x 2 puffs each am only right after the symbicort 160  Only use your albuterol as rescue    06/23/2015  f/u ov/Wert re: copd GOLDIII with reversibility /  symb 160 2bid and spiriva daily   Chief Complaint  Patient presents with  . Follow-up    Breathing is unchanged. He uses rescue inhaler 1 x pr wk on average.   using albuterol for noisy breathing at hs / trying e cigs but still smoking reg cigs also Doe = MMRC1 = can walk nl pace, flat grade, can't hurry or go uphills or steps s sob     No obvious day to day or daytime variabilty or assoc excess/ purulent sputum production  or cp or chest tightness, subjective wheeze overt sinus or hb symptoms. No unusual exp hx or h/o childhood pna/ asthma or knowledge of premature birth.  Sleeping ok without nocturnal  or early am exacerbation  of respiratory  c/o's or need for noct saba. Also denies any obvious fluctuation of symptoms with weather or environmental  changes or other aggravating or alleviating factors except as outlined above   Current Medications, Allergies, Complete Past Medical History, Past Surgical History, Family History, and Social History were reviewed in [employer] record.  ROS  The following are not active complaints unless bolded sore throat, dysphagia, dental problems, itching, sneezing,  nasal congestion or excess/ purulent secretions, ear ache,   fever, chills, sweats, unintended wt loss, pleuritic or exertional cp, hemoptysis,  orthopnea pnd or leg swelling, presyncope, palpitations, heartburn, abdominal pain, anorexia, nausea, vomiting, diarrhea  or change in bowel or urinary habits, change in stools or urine, dysuria,hematuria,  rash, arthralgias, visual complaints, headache, numbness weakness or ataxia or problems with  walking or coordination,  change in mood/affect or memory.              Objective:   Physical Exam  01/18/2014       179 > 02/07/2015 190 > 03/21/2015   192 > 06/23/2015  196     02/09/13 177 lb (80.287 kg)  12/27/12 173 lb 12.8 oz (78.835 kg)    Elderly amb wm nad/ min rattling  Vital signs reviewed    HEENT mild turbinate edema.  Extremely poor lower dentition / upper edentulous/  Oropharynx no thrush or excess pnd or cobblestoning.  No JVD or cervical adenopathy. Mild accessory muscle hypertrophy. Trachea midline, nl thryroid. Chest was hyperinflated by percussion with diminished breath sounds and moderate increased exp time with mid exp bilateral exp rhonchi. Hoover sign positive at mid inspiration. Regular rate and rhythm without murmur gallop or rub or increase P2 or edema.  Abd: obese/no hsm, nl excursion. Ext warm without cyanosis or clubbing.       CXR PA and Lateral:   02/07/2015 :     I personally reviewed images and agree with radiology impression as follows:   No acute cardiopulmonary disease. Stable appearance from the prior study       Assessment & Plan:

## 2015-06-25 NOTE — Assessment & Plan Note (Signed)
-   02/09/2013  FEV1 2.14 (68%) ratio 60 p 30% better from B2 and dloc 52 corrects to 69  - 02/07/2015  Walked RA x 3 laps @ 185 ft each stopped due to  End of study, nl pace, no sob or desat   - PFT's  03/21/2015  FEV1 1.92 (62 % ) ratio 60  p 14 % improvement from saba with DLCO  47 % corrects to 62 % for alv volume   - trial of spiriva respimat 03/21/2015  With goal to be able to walk up inclines > no better 06/23/2015 > try bevespi 2 bid   - The proper method of use, as well as anticipated side effects, of a metered-dose inhaler are discussed and demonstrated to the patient. Improved effectiveness after extensive coaching during this visit to a level of approximately 90 % from a baseline of 75 %   Discussed in detail all the  indications, usual  risks and alternatives  relative to the benefits with patient who agrees to proceed with trial of bevespi 2bid > if nothing else that would reduce his cost by making it so he pays only one deductible  I had an extended discussion with the patient reviewing all relevant studies completed to date and  lasting 15 to 20 minutes of a 25 minute visit    Each maintenance medication was reviewed in detail including most importantly the difference between maintenance and prns and under what circumstances the prns are to be triggered using an action plan format that is not reflected in the computer generated alphabetically organized AVS.    Please see instructions for details which were reviewed in writing and the patient given a copy highlighting the part that I personally wrote and discussed at today's ov.

## 2015-06-25 NOTE — Assessment & Plan Note (Signed)
>   3 min discussion   Discussed the risks and costs (both direct and indirect)  of smoking relative to the benefits of quitting but patient unwilling to commit at this point to a specific quit date.    Suggested he go ahead and "get on the one-way bridge" off cigs by using e cigs exclusively .

## 2015-09-22 ENCOUNTER — Ambulatory Visit (INDEPENDENT_AMBULATORY_CARE_PROVIDER_SITE_OTHER): Payer: Medicare Other | Admitting: Internal Medicine

## 2015-09-22 ENCOUNTER — Encounter (INDEPENDENT_AMBULATORY_CARE_PROVIDER_SITE_OTHER): Payer: Self-pay

## 2015-09-22 ENCOUNTER — Encounter: Payer: Self-pay | Admitting: Internal Medicine

## 2015-09-22 VITALS — BP 124/80 | HR 83 | Ht 70.5 in | Wt 198.2 lb

## 2015-09-22 DIAGNOSIS — J449 Chronic obstructive pulmonary disease, unspecified: Secondary | ICD-10-CM

## 2015-09-22 DIAGNOSIS — Z720 Tobacco use: Secondary | ICD-10-CM

## 2015-09-22 DIAGNOSIS — F17210 Nicotine dependence, cigarettes, uncomplicated: Secondary | ICD-10-CM

## 2015-09-22 MED ORDER — ALBUTEROL SULFATE (2.5 MG/3ML) 0.083% IN NEBU: 2.5000 mg | INHALATION_SOLUTION | Freq: Four times a day (QID) | RESPIRATORY_TRACT | Status: DC | PRN

## 2015-09-22 MED ORDER — PREDNISONE 10 MG PO TABS: ORAL_TABLET | ORAL | Status: DC

## 2015-09-22 NOTE — Progress Notes (Signed)
Subjective:    Patient ID: Joel Perez, male    DOB: [birth year]   MRN: [id number]    Brief patient profile:  22 yowm active smoker referred by Dr Moreen Fowler for eval of copd with GOLD II criteria established 02/09/13    History of Present Illness  12/27/2012 1st pulmonary eval/ Wert cc onset x 2009 am cough cough/ congestion and progresive doe with white mucus x sev tbsp x sev min better p proaire and maintained on spiriva which he feels doesn't work as well as advair. Doe x one flight steps, dragging the garbage to street stops x 2. He denies cough to me but actually has a rattling with cough maneuver but doesn't bring up much mucus, what he does bring up is mostly in ams and mucoid and some better since quit smoking  rec symbicort 160 Take 2 puffs first thing in am and then another 2 puffs about 12 hours later.  Only use your albuterol (proaire) as a rescue medication to be used if you can't catch your breath by resting or doing a relaxed purse lip breathing pattern. The less you use it, the better it will work when you need it.  As you improve ok to stop spiriva  Please schedule a follow up office visit in 6 weeks, call sooner if needed with pft's   02/09/2013 f/u ov/Wert re: GOLD II with reversibilty Chief Complaint  Patient presents with  . Follow-up    W/ PFT. Pt reports his breathing is better. He gets up tan color phlem up right after breathing tx  still rattling some but much better.  rec Continue symbicort 160 Take 2 puffs first thing in am and then another 2 puffs about 12 hours later.  Only use your proair as a rescue medication to be used if you can't catch your breath by resting or doing a relaxed purse lip breathing pattern. The less you use it, the better it will work when you need it.  Don't leave home without it [place] to leave off spiriva     03/21/2015  f/u ov/Wert re: COPD III with reversibilty on symb 160 2bid / still smoking Chief Complaint  Patient presents with  .  Follow-up    PFT done today. Breathing has improved since the last visit. He is usin proair 1-2 x per wk on average  Wants to be able to walk up inclines s sob  rec Add spiriva respimat x 2 puffs each am only right after the symbicort 160  Only use your albuterol as rescue    06/23/2015  f/u ov/Wert re: copd GOLDIII with reversibility /  symb 160 2bid and spiriva daily   Chief Complaint  Patient presents with  . Follow-up    Breathing is unchanged. He uses rescue inhaler 1 x pr wk on average.   using albuterol for noisy breathing at hs / trying e cigs but still smoking reg cigs also Doe = MMRC1 = can walk nl pace, flat grade, can't hurry or go uphills or steps s sob    rec Bevespi Take 2 puffs first thing in am and then another 2 puffs about 12 hours later and stop symbicort and spiriva for now  Only use your albuterol as a rescue medication The key is to stop smoking completely before smoking completely stops you- think of the e cigs as a one way bridge off all tobacco products   09/22/2015  f/u ov/Wert re: GOLD III copd/ maint rx =  symbicort/ spiriva resp Chief Complaint  Patient presents with  . Follow-up    Stopped smoking 1 month ago. He states breathing has been worse x 2 months- using albuterol 2 x daily on average. He has also noticed some wheezing at night.   now has neb uses at hs helps more than hfa but confused as to how to use it with his other resp meds  Doe continues = MMRC1 = can walk nl pace, flat grade, can't hurry or go uphills or steps s sob    No obvious day to day or daytime variabilty or assoc excess/ purulent sputum production  or cp or chest tightness, subjective wheeze overt sinus or hb symptoms. No unusual exp hx or h/o childhood pna/ asthma or knowledge of premature birth.  Sleeping ok without nocturnal  or early am exacerbation  of respiratory  c/o's or need for noct saba. Also denies any obvious fluctuation of symptoms with weather or environmental changes or  other aggravating or alleviating factors except as outlined above   Current Medications, Allergies, Complete Past Medical History, Past Surgical History, Family History, and Social History were reviewed in [employer] record.  ROS  The following are not active complaints unless bolded sore throat, dysphagia, dental problems, itching, sneezing,  nasal congestion or excess/ purulent secretions, ear ache,   fever, chills, sweats, unintended wt loss, pleuritic or exertional cp, hemoptysis,  orthopnea pnd or leg swelling, presyncope, palpitations, heartburn, abdominal pain, anorexia, nausea, vomiting, diarrhea  or change in bowel or urinary habits, change in stools or urine, dysuria,hematuria,  rash, arthralgias, visual complaints, headache, numbness weakness or ataxia or problems with walking or coordination,  change in mood/affect or memory.              Objective:   Physical Exam  01/18/2014       179 > 02/07/2015 190 > 03/21/2015   192 > 06/23/2015  196 > 09/22/2015     02/09/13 177 lb (80.287 kg)  12/27/12 173 lb 12.8 oz (78.835 kg)    Elderly amb wm nad/  Vital signs reviewed    HEENT mild turbinate edema.  Extremely poor lower dentition / upper edentulous/  Oropharynx no thrush or excess pnd or cobblestoning.  No JVD or cervical adenopathy. Mild accessory muscle hypertrophy. Trachea midline, nl thryroid. Chest was hyperinflated by percussion with diminished breath sounds and moderate increased exp time with mid exp bilateral exp rhonchi. Hoover sign positive at mid inspiration. Regular rate and rhythm without murmur gallop or rub or increase P2 or edema.  Abd: obese/no hsm, nl excursion. Ext warm without cyanosis or clubbing.            Assessment & Plan:

## 2015-09-22 NOTE — Assessment & Plan Note (Addendum)
Changed exclusively  to e cigs 07/2015   Although I don't endorse regular use of e cigs/ many pts find them helpful; however, I emphasized they should be considered a "one-way bridge" off all tobacco products > should taper off the ecigs if possible over the next 3 months

## 2015-09-22 NOTE — Assessment & Plan Note (Addendum)
-   02/09/2013  FEV1 2.14 (68%) ratio 60 p 30% better from B2 and dloc 52 corrects to 69 - 02/07/2015  extensive coaching HFA effectiveness =    90% from a baseline of 75%  - 02/07/2015  Walked RA x 3 laps @ 185 ft each stopped due to  End of study, nl pace, no sob or desat   - PFT's  03/21/2015  FEV1 1.92 (62 % ) ratio 60  p 14 % improvement from saba with DLCO  47 % corrects to 62 % for alv volume   - trial of spiriva respimat 03/21/2015  With goal to be able to walk up inclines > no better 06/23/2015 > try bevespi 2 bid x 3 weeks  -sob   Worse p 3 weeks bevespi  so resumed symb/spirva 06/25/2015  - quite smoking 07/2015> e cigs - referred to rehab 09/22/2015 >>>  Worse despite quit smoking ? Why?   Adherence is always the initial "prime suspect" and is a multilayered concern that requires a "trust but verify" approach in every patient - starting with knowing how to use medications, especially inhalers, correctly, keeping up with refills and understanding the fundamental difference between maintenance and prns vs those medications only taken for a very short course and then stopped and not refilled.  - ABC plan reviewed in writing - see AVS   ? Allergy > Prednisone 10 mg take  4 each am x 2 days,   2 each am x 2 days,  1 each am x 2 days and stop and continue high dose ics  ? Anxiety related to deconditioning > rehab   I had an extended discussion with the patient reviewing all relevant studies completed to date and  lasting 15 to 20 minutes of a 25 minute visit    Each maintenance medication was reviewed in detail including most importantly the difference between maintenance and prns and under what circumstances the prns are to be triggered using an action plan format that is not reflected in the computer generated alphabetically organized AVS.    Please see instructions for details which were reviewed in writing and the patient given a copy highlighting the part that I personally wrote and discussed  at today's ov.

## 2015-09-22 NOTE — Patient Instructions (Addendum)
Prednisone 10 mg take  4 each am x 2 days,   2 each am x 2 days,  1 each am x 2 days and stop   Plan A = Automatic = Symbicort 160 2 pffs first thing followed by Anna Genre and another 2 pffs 12 hours later   Plan B = Backup Only use your albuterol as a rescue medication to be used if you can't catch your breath by resting or doing a relaxed purse lip breathing pattern.  - The less you use it, the better it will work when you need it. - Ok to use the inhaler up to 2 puffs  every 4 hours if you must but call for appointment if use goes up over your usual need - Don't leave home without it !!  (think of it like the spare tire for your car)   Plan C = Crisis - only use your albuterol nebulizer if you first try Plan B and it fails to help > ok to use the nebulizer up to every 4 hours but if start needing it regularly call for immediate appointment  We will be referring you to rehab at [facility]   Please schedule a follow up visit in 3 months but call sooner if needed

## 2015-11-04 ENCOUNTER — Encounter (HOSPITAL_COMMUNITY)
Admission: RE | Admit: 2015-11-04 | Discharge: 2015-11-04 | Disposition: A | Discharge status: Home / Self Care | Payer: Medicare Other | Source: Ambulatory Visit | Attending: Internal Medicine | Admitting: Internal Medicine

## 2015-11-04 VITALS — BP 146/78 | HR 99 | Ht 70.0 in | Wt 198.4 lb

## 2015-11-04 DIAGNOSIS — J449 Chronic obstructive pulmonary disease, unspecified: Secondary | ICD-10-CM | POA: Insufficient documentation

## 2015-11-04 NOTE — Progress Notes (Signed)
Cardiac/Pulmonary Rehab Medication Review by a Pharmacist  Does the patient  feel that his/her medications are working for him/her?  yes  Has the patient been experiencing any side effects to the medications prescribed?  no  Does the patient measure his/her own blood pressure or blood glucose at home?  Yes.  He states it runs a little high but he does not take any medications for blood pressure  Does the patient have any problems obtaining medications due to transportation or finances?   No.  Sometimes it can be a "little tight" with finances  Understanding of regimen: good Understanding of indications: good Potential of compliance: good  Beverlee Nims 11/04/2015 3:14 PM

## 2015-11-04 NOTE — Progress Notes (Signed)
Pulmonary Individual Treatment Plan  Patient Details  Name: Joel Perez MRN: [id number] Date of Birth: Dec 31, 1945 Referring Provider:        PULMONARY REHAB COPD ORIENTATION from 11/04/2015 in [place]   Referring Provider  Dr. [name]      Initial Encounter Date:       PULMONARY REHAB COPD ORIENTATION from 11/04/2015 in [place]   Date  11/04/15   Referring Provider  Dr. [name]      Visit Diagnosis: COPD mixed type ([place])  Patient's Home Medications on Admission:   Current outpatient prescriptions:  .  albuterol (PROAIR HFA) 108 (90 BASE) MCG/ACT inhaler, INHALE 2 PUFFS INTO THE LUNGS EVERY 6 HOURS AS NEEDED FOR WHEEZING OR SHORTNESS OF BREATH, Disp: 1 each, Rfl: 11 .  albuterol (PROVENTIL) (2.5 MG/3ML) 0.083% nebulizer solution, Take 3 mLs (2.5 mg total) by nebulization every 6 (six) hours as needed for wheezing or shortness of breath., Disp: 75 mL, Rfl: 11 .  aspirin 81 MG tablet, Take 81 mg by mouth daily., Disp: , Rfl:  .  b complex vitamins tablet, Take 1 tablet by mouth daily., Disp: , Rfl:  .  Cyclobenzaprine HCl (FLEXERIL PO), Take 1 tablet by mouth 3 (three) times daily as needed., Disp: , Rfl:  .  Multiple Vitamin (MULTIVITAMIN) capsule, Take 1 capsule by mouth daily., Disp: , Rfl:  .  oxyCODONE-acetaminophen (PERCOCET/ROXICET) 5-325 MG per tablet, Take 1 tablet by mouth every 6 (six) hours as needed., Disp: , Rfl:  .  predniSONE (DELTASONE) 10 MG tablet, Take  4 each am x 2 days,   2 each am x 2 days,  1 each am x 2 days and stop (Patient not taking: Reported on 11/04/2015), Disp: 14 tablet, Rfl: 0 .  Sildenafil Citrate (VIAGRA PO), Take 1 tablet by mouth as needed., Disp: , Rfl:  .  sodium chloride (OCEAN) 0.65 % nasal spray, Place 1 spray into the nose as needed for congestion., Disp: , Rfl:  .  SPIRIVA RESPIMAT 2.5 MCG/ACT AERS, Take 2 puffs by mouth daily., Disp: , Rfl:  .  SYMBICORT 160-4.5 MCG/ACT inhaler, Inhale 2 puffs  into the lungs 2 (two) times daily., Disp: , Rfl:   Past Medical History: Past Medical History  Diagnosis Date  . COPD (chronic obstructive pulmonary disease) ([place])   . Low back pain     Tobacco Use: History  Smoking status  . Former Smoker -- 2.00 packs/day for 50 years  . Types: Cigarettes  . Quit date: 08/23/2015  Smokeless tobacco  . Never Used    Labs:     Recent Review Flowsheet Data    There is no flowsheet data to display.      Capillary Blood Glucose: No results found for: GLUCAP   ADL UCSD:     Pulmonary Assessment Scores      11/04/15 1713       ADL UCSD   ADL Phase Entry     SOB Score total 42     Rest 0     Walk 8     Stairs 4     Bath 0     Dress 1     Shop 2     CAT Score   CAT Score 15     mMRC Score   mMRC Score 3        Pulmonary Function Assessment:     Pulmonary Function Assessment - 11/04/15 1710    Pulmonary  Function Tests   FVC% 76 %   FEV1% 62 %   FEV1/FVC Ratio 60   Post Bronchodilator Spirometry Results   FVC% 76 %   FEV1% 62 %   FEV1/FVC Ratio 60   Breath   Shortness of Breath Fear of Shortness of Breath;Yes      Exercise Target Goals: Date: 11/04/15  Exercise Program Goal: Individual exercise prescription set with THRR, safety & activity barriers. Participant demonstrates ability to understand and report RPE using BORG scale, to self-measure pulse accurately, and to acknowledge the importance of the exercise prescription.  Exercise Prescription Goal: Starting with aerobic activity 30 plus minutes a day, 3 days per week for initial exercise prescription. Provide home exercise prescription and guidelines that participant acknowledges understanding prior to discharge.  Activity Barriers & Risk Stratification:   6 Minute Walk:     6 Minute Walk      11/04/15 1620       6 Minute Walk   Phase Initial     Distance 1050 feet     Walk Time 6 minutes     # of Rest Breaks 0     MPH 1.98     METS 2.52      RPE 12     Perceived Dyspnea  13     VO2 Peak 9.09     Symptoms No     Resting HR 90 bpm     Resting BP 146/78 mmHg     Max Ex. HR 102 bpm     Max Ex. BP 162/80 mmHg     2 Minute Post BP 154/78 mmHg        Initial Exercise Prescription:     Initial Exercise Prescription - 11/04/15 1600    Date of Initial Exercise RX and Referring Provider   Date 11/04/15   Referring Provider Dr. [name]   Treadmill   MPH 1.3   Grade 0   Minutes 15   METs 1.9   NuStep   Level 2   Watts 14   Minutes 15   METs 1.9   Arm Ergometer   Level 2   Watts 16   Minutes 15   METs 2.2   Prescription Details   Frequency (times per week) 2   Duration Progress to 30 minutes of continuous aerobic without signs/symptoms of physical distress   Intensity   THRR REST +  20   THRR 40-80% of Max Heartrate [phone]   Ratings of Perceived Exertion 11-13   Perceived Dyspnea 0-4   Progression   Progression Continue to progress workloads to maintain intensity without signs/symptoms of physical distress.   Resistance Training   Training Prescription Yes   Weight 1   Reps 10-12      Perform Capillary Blood Glucose checks as needed.  Exercise Prescription Changes:   Exercise Comments:   Discharge Exercise Prescription (Final Exercise Prescription Changes):   Nutrition:  Target Goals: Understanding of nutrition guidelines, daily intake of sodium 1500mg , cholesterol 200mg , calories 30% from fat and 7% or less from saturated fats, daily to have 5 or more servings of fruits and vegetables.  Biometrics:     Pre Biometrics - 11/04/15 1610    Pre Biometrics   Height 5\' 10"  (1.778 m)   Weight 198 lb 6.6 oz (90 kg)   Waist Circumference 42 inches   Hip Circumference 41.5 inches   Waist to Hip Ratio 1.01 %   BMI (Calculated) 28.5   Triceps Skinfold 21 mm   %  Body Fat 30.1 %   Grip Strength 80 kg   Flexibility 0 in   Single Leg Stand 12 seconds       Nutrition Therapy Plan and Nutrition  Goals:   Nutrition Discharge: Rate Your Plate Scores:     Nutrition Assessments - 11/04/15 1718    Rate Your Plate Scores   Pre Score 53      Psychosocial: Target Goals: Acknowledge presence or absence of depression, maximize coping skills, provide positive support system. Participant is able to verbalize types and ability to use techniques and skills needed for reducing stress and depression.  Initial Review & Psychosocial Screening:     Initial Psych Review & Screening - 11/04/15 1730    Initial Review   Current issues with --  N/A   Family Dynamics   Good Support System? Yes   Barriers   Psychosocial barriers to participate in program There are no identifiable barriers or psychosocial needs.   Screening Interventions   Interventions Encouraged to exercise      Quality of Life Scores:     Quality of Life - 11/04/15 1731    Quality of Life Scores   Health/Function Pre 21 %   Socioeconomic Pre 21 %   Psych/Spiritual Pre 21 %   Family Pre 21 %   GLOBAL Pre 21 %      PHQ-9:     Recent Review Flowsheet Data    Depression screen [facility] 2/9 11/04/2015   Decreased Interest 1   Down, Depressed, Hopeless 0   PHQ - 2 Score 1   Altered sleeping 0   Tired, decreased energy 2   Change in appetite 0   Feeling bad or failure about yourself  0   Trouble concentrating 0   Moving slowly or fidgety/restless 0   Suicidal thoughts 0   PHQ-9 Score 3   Difficult doing work/chores Somewhat difficult      Psychosocial Evaluation and Intervention:     Psychosocial Evaluation - 11/04/15 1730    Psychosocial Evaluation & Interventions   Interventions --  No referral needed. Patient is not depressed.    Continued Psychosocial Services Needed No      Psychosocial Re-Evaluation:   Education: Education Goals: Education classes will be provided on a weekly basis, covering required topics. Participant will state understanding/return demonstration of topics  presented.  Learning Barriers/Preferences:     Learning Barriers/Preferences - 11/04/15 1618    Learning Barriers/Preferences   Learning Barriers None   Learning Preferences Video;Individual Instruction      Education Topics: How Lungs Work and Diseases: - Discuss the anatomy of the lungs and diseases that can affect the lungs, such as COPD.   Exercise: -Discuss the importance of exercise, FITT principles of exercise, normal and abnormal responses to exercise, and how to exercise safely.   Environmental Irritants: -Discuss types of environmental irritants and how to limit exposure to environmental irritants.   Meds/Inhalers and oxygen: - Discuss respiratory medications, definition of an inhaler and oxygen, and the proper way to use an inhaler and oxygen.   Energy Saving Techniques: - Discuss methods to conserve energy and decrease shortness of breath when performing activities of daily living.    Bronchial Hygiene / Breathing Techniques: - Discuss breathing mechanics, pursed-lip breathing technique,  proper posture, effective ways to clear airways, and other functional breathing techniques   Cleaning Equipment: - Provides group verbal and written instruction about the health risks of elevated stress, cause of high stress, and healthy  ways to reduce stress.   Nutrition I: Fats: - Discuss the types of cholesterol, what cholesterol does to the body, and how cholesterol levels can be controlled.   Nutrition II: Labels: -Discuss the different components of food labels and how to read food labels.   Respiratory Infections: - Discuss the signs and symptoms of respiratory infections, ways to prevent respiratory infections, and the importance of seeking medical treatment when having a respiratory infection.   Stress I: Signs and Symptoms: - Discuss the causes of stress, how stress may lead to anxiety and depression, and ways to limit stress.   Stress II:  Relaxation: -Discuss relaxation techniques to limit stress.   Oxygen for Home/Travel: - Discuss how to prepare for travel when on oxygen and proper ways to transport and store oxygen to ensure safety.   Knowledge Questionnaire Score:     Knowledge Questionnaire Score - 11/04/15 1709    Knowledge Questionnaire Score   Pre Score 9/14      Core Components/Risk Factors/Patient Goals at Admission:     Personal Goals and Risk Factors at Admission - 11/04/15 1719    Core Components/Risk Factors/Patient Goals on Admission    Weight Management Yes   Intervention Weight Management: Provide education and appropriate resources to help participant work on and attain dietary goals.   Admit Weight 198 lb (89.812 kg)   Goal Weight: Short Term 190 lb 8 oz (86.41 kg)   Goal Weight: Long Term 183 lb (83.008 kg)   Expected Outcomes Short Term: Continue to assess and modify interventions until short term weight is achieved;Long Term: Adherence to nutrition and physical activity/exercise program aimed toward attainment of established weight goal   Increase Strength and Stamina Yes   Intervention Provide advice, education, support and counseling about physical activity/exercise needs.;Develop an individualized exercise prescription for aerobic and resistive training based on initial evaluation findings, risk stratification, comorbidities and participant's personal goals.   Expected Outcomes Achievement of increased cardiorespiratory fitness and enhanced flexibility, muscular endurance and strength shown through measurements of functional capacity and personal statement of participant.   Improve shortness of breath with ADL's Yes   Intervention Provide education, individualized exercise plan and daily activity instruction to help decrease symptoms of SOB with activities of daily living.   Expected Outcomes Short Term: Achieves a reduction of symptoms when performing activities of daily living.   Develop  more efficient breathing techniques such as purse lipped breathing and diaphragmatic breathing; and practicing self-pacing with activity Yes   Intervention Provide education, demonstration and support about specific breathing techniuqes utilized for more efficient breathing. Include techniques such as pursed lipped breathing, diaphragmatic breathing and self-pacing activity.   Expected Outcomes Short Term: Participant will be able to demonstrate and use breathing techniques as needed throughout daily activities.   Increase knowledge of respiratory medications and ability to use respiratory devices properly  Yes   Intervention Provide education and demonstration as needed of appropriate use of medications, inhalers, and oxygen therapy.   Expected Outcomes Short Term: Achieves understanding of medications use. Understands that oxygen is a medication prescribed by physician. Demonstrates appropriate use of inhaler and oxygen therapy.   Personal Goal Other Yes   Personal Goal Lose 5-15lbs within 12 weeks of PR, breath better, better health overall   Intervention Exercise 2 days week in program and supplement 3 more day at home.    Expected Outcomes Better breathing, weight loss      Core Components/Risk Factors/Patient Goals Review:    Core  Components/Risk Factors/Patient Goals at Discharge (Final Review):    ITP Comments:   Comments: Patient arrived for 1st visit/orientation/education at 1430. Patient was referred to PR by Dr. [name] due to COPD (J44.9). During orientation advised patient on arrival and appointment times what to wear, what to do before, during and after exercise. Reviewed attendance and class policy. Talked about inclement weather and class consultation policy. Pt is scheduled to return Pulm Rehab on 11/11/15 at 1330. Pt was advised to come to class 15 minutes before class starts. He was also given instructions on meeting with the dietician and attending the Family Structure classes.  Pt is eager to get started. Patient was able to complete 6 minute walk test. Patient c/o of hip pain during test 3/10. Pain stopped after test during rest. Patient was measured for the equipment. Discussed equipment safety with patient. Took patient pre-anthropometric measurements. Patient finished visit at 1630.

## 2015-11-11 ENCOUNTER — Encounter (HOSPITAL_COMMUNITY)
Admission: RE | Admit: 2015-11-11 | Discharge: 2015-11-11 | Disposition: A | Discharge status: Home / Self Care | Payer: Medicare Other | Source: Ambulatory Visit | Attending: Internal Medicine | Admitting: Internal Medicine

## 2015-11-11 DIAGNOSIS — J449 Chronic obstructive pulmonary disease, unspecified: Secondary | ICD-10-CM | POA: Diagnosis not present

## 2015-11-11 NOTE — Progress Notes (Signed)
Daily Session Note  Patient Details  Name: Joel Perez MRN: [id number] Date of Birth: 1945-11-30 Referring Provider:        PULMONARY REHAB COPD ORIENTATION from 11/04/2015 in [place]   Referring Provider  Dr. [name]      Encounter Date: 11/11/2015  Check In:     Session Check In - 11/11/15 1334    Check-In   Location [facility]   Staff Present Diane Angelina Pih, MS, EP, [facility], Exercise Physiologist;Gregory Luther Parody, BS, EP, Exercise Physiologist   Supervising physician immediately available to respond to emergencies See telemetry face sheet for immediately available MD   Medication changes reported     No   Fall or balance concerns reported    No   Warm-up and Cool-down Performed as group-led instruction   Resistance Training Performed Yes   VAD Patient? No   Pain Assessment   Currently in Pain? No/denies   Pain Score 0-No pain   Multiple Pain Sites No      Capillary Blood Glucose: No results found for this or any previous visit (from the past 24 hour(s)).   Goals Met:  Independence with exercise equipment Exercise tolerated well No report of cardiac concerns or symptoms Strength training completed today  Goals Unmet:  Not Applicable  Comments: Check out 230   Dr. Kate Sable is Medical Director for [place] and Pulmonary Rehab.

## 2015-11-13 ENCOUNTER — Encounter (HOSPITAL_COMMUNITY)
Admission: RE | Admit: 2015-11-13 | Discharge: 2015-11-13 | Disposition: A | Discharge status: Home / Self Care | Payer: Medicare Other | Source: Ambulatory Visit | Attending: Internal Medicine | Admitting: Internal Medicine

## 2015-11-13 DIAGNOSIS — J449 Chronic obstructive pulmonary disease, unspecified: Secondary | ICD-10-CM

## 2015-11-13 NOTE — Progress Notes (Signed)
Daily Session Note  Patient Details  Name: Joel Perez MRN: [id number] Date of Birth: 1946/01/24 Referring Provider:        PULMONARY REHAB COPD ORIENTATION from 11/04/2015 in [place]   Referring Provider  Dr. [name]      Encounter Date: 11/13/2015  Check In:     Session Check In - 11/13/15 1330    Check-In   Location [facility]   Staff Present Diane Angelina Pih, MS, EP, [facility], Exercise Physiologist;Gregory Luther Parody, BS, EP, Exercise Physiologist   Supervising physician immediately available to respond to emergencies See telemetry face sheet for immediately available MD   Medication changes reported     No   Fall or balance concerns reported    No   Warm-up and Cool-down Performed as group-led instruction   Resistance Training Performed Yes   VAD Patient? No   Pain Assessment   Currently in Pain? No/denies   Pain Score 0-No pain   Multiple Pain Sites No      Capillary Blood Glucose: No results found for this or any previous visit (from the past 24 hour(s)).   Goals Met:  Independence with exercise equipment Exercise tolerated well No report of cardiac concerns or symptoms Strength training completed today  Goals Unmet:  Not Applicable  Comments: Check out 230   Dr. Kate Sable is Medical Director for [facility] and [facility] Re[facility].

## 2015-11-17 NOTE — Progress Notes (Signed)
Pulmonary Individual Treatment Plan  Patient Details  Name: Joel Perez MRN: [id number] Date of Birth: 1945/08/13 Referring Provider:        PULMONARY REHAB COPD ORIENTATION from 11/04/2015 in [place]   Referring Provider  Dr. [name]      Initial Encounter Date:       PULMONARY REHAB COPD ORIENTATION from 11/04/2015 in [place]   Date  11/04/15   Referring Provider  Dr. [name]      Visit Diagnosis: COPD mixed type ([place])  Patient's Home Medications on Admission:   Current outpatient prescriptions:  .  albuterol (PROAIR HFA) 108 (90 BASE) MCG/ACT inhaler, INHALE 2 PUFFS INTO THE LUNGS EVERY 6 HOURS AS NEEDED FOR WHEEZING OR SHORTNESS OF BREATH, Disp: 1 each, Rfl: 11 .  albuterol (PROVENTIL) (2.5 MG/3ML) 0.083% nebulizer solution, Take 3 mLs (2.5 mg total) by nebulization every 6 (six) hours as needed for wheezing or shortness of breath., Disp: 75 mL, Rfl: 11 .  aspirin 81 MG tablet, Take 81 mg by mouth daily., Disp: , Rfl:  .  b complex vitamins tablet, Take 1 tablet by mouth daily., Disp: , Rfl:  .  Cyclobenzaprine HCl (FLEXERIL PO), Take 1 tablet by mouth 3 (three) times daily as needed., Disp: , Rfl:  .  Multiple Vitamin (MULTIVITAMIN) capsule, Take 1 capsule by mouth daily., Disp: , Rfl:  .  oxyCODONE-acetaminophen (PERCOCET/ROXICET) 5-325 MG per tablet, Take 1 tablet by mouth every 6 (six) hours as needed., Disp: , Rfl:  .  predniSONE (DELTASONE) 10 MG tablet, Take  4 each am x 2 days,   2 each am x 2 days,  1 each am x 2 days and stop (Patient not taking: Reported on 11/04/2015), Disp: 14 tablet, Rfl: 0 .  Sildenafil Citrate (VIAGRA PO), Take 1 tablet by mouth as needed., Disp: , Rfl:  .  sodium chloride (OCEAN) 0.65 % nasal spray, Place 1 spray into the nose as needed for congestion., Disp: , Rfl:  .  SPIRIVA RESPIMAT 2.5 MCG/ACT AERS, Take 2 puffs by mouth daily., Disp: , Rfl:  .  SYMBICORT 160-4.5 MCG/ACT inhaler, Inhale 2 puffs  into the lungs 2 (two) times daily., Disp: , Rfl:   Past Medical History: Past Medical History  Diagnosis Date  . COPD (chronic obstructive pulmonary disease) ([place])   . Low back pain     Tobacco Use: History  Smoking status  . Former Smoker -- 2.00 packs/day for 50 years  . Types: Cigarettes  . Quit date: 08/23/2015  Smokeless tobacco  . Never Used    Labs:     Recent Review Flowsheet Data    There is no flowsheet data to display.      Capillary Blood Glucose: No results found for: GLUCAP   ADL UCSD:     Pulmonary Assessment Scores      11/04/15 1713       ADL UCSD   ADL Phase Entry     SOB Score total 42     Rest 0     Walk 8     Stairs 4     Bath 0     Dress 1     Shop 2     CAT Score   CAT Score 15     mMRC Score   mMRC Score 3        Pulmonary Function Assessment:     Pulmonary Function Assessment - 11/04/15 1710    Pulmonary  Function Tests   FVC% 76 %   FEV1% 62 %   FEV1/FVC Ratio 60   Post Bronchodilator Spirometry Results   FVC% 76 %   FEV1% 62 %   FEV1/FVC Ratio 60   Breath   Shortness of Breath Fear of Shortness of Breath;Yes      Exercise Target Goals:    Exercise Program Goal: Individual exercise prescription set with THRR, safety & activity barriers. Participant demonstrates ability to understand and report RPE using BORG scale, to self-measure pulse accurately, and to acknowledge the importance of the exercise prescription.  Exercise Prescription Goal: Starting with aerobic activity 30 plus minutes a day, 3 days per week for initial exercise prescription. Provide home exercise prescription and guidelines that participant acknowledges understanding prior to discharge.  Activity Barriers & Risk Stratification:   6 Minute Walk:     6 Minute Walk      11/04/15 1620       6 Minute Walk   Phase Initial     Distance 1050 feet     Walk Time 6 minutes     # of Rest Breaks 0     MPH 1.98     METS 2.52     RPE 12      Perceived Dyspnea  13     VO2 Peak 9.09     Symptoms No     Resting HR 90 bpm     Resting BP 146/78 mmHg     Max Ex. HR 102 bpm     Max Ex. BP 162/80 mmHg     2 Minute Post BP 154/78 mmHg        Initial Exercise Prescription:     Initial Exercise Prescription - 11/04/15 1600    Date of Initial Exercise RX and Referring Provider   Date 11/04/15   Referring Provider Dr. [name]   Treadmill   MPH 1.3   Grade 0   Minutes 15   METs 1.9   NuStep   Level 2   Watts 14   Minutes 15   METs 1.9   Arm Ergometer   Level 2   Watts 16   Minutes 15   METs 2.2   Prescription Details   Frequency (times per week) 2   Duration Progress to 30 minutes of continuous aerobic without signs/symptoms of physical distress   Intensity   THRR REST +  20   THRR 40-80% of Max Heartrate [phone]   Ratings of Perceived Exertion 11-13   Perceived Dyspnea 0-4   Progression   Progression Continue to progress workloads to maintain intensity without signs/symptoms of physical distress.   Resistance Training   Training Prescription Yes   Weight 1   Reps 10-12      Perform Capillary Blood Glucose checks as needed.  Exercise Prescription Changes:      Exercise Prescription Changes      11/17/15 1800           Exercise Review   Progression Yes       Response to Exercise   Blood Pressure (Admit) 148/70 mmHg       Blood Pressure (Exercise) 158/78 mmHg       Blood Pressure (Exit) 142/76 mmHg       Heart Rate (Admit) 84 bpm       Heart Rate (Exercise) 88 bpm       Heart Rate (Exit) 82 bpm       Rating of Perceived Exertion (Exercise) 11  Perceived Dyspnea (Exercise) 9       Duration Progress to 30 minutes of continuous aerobic without signs/symptoms of physical distress       Intensity Rest + 20       Progression   Progression Continue to progress workloads to maintain intensity without signs/symptoms of physical distress.       Resistance Training   Training Prescription No        Weight 1       Reps 10-12       Treadmill   MPH 0       Grade 0       Minutes 0       METs 0       NuStep   Level 2       Watts 12       Minutes 15       METs 3.61       Arm Ergometer   Level 2.2       Watts 15       Minutes 20       METs 2       Home Exercise Plan   Plans to continue exercise at Home       Frequency Add 2 additional days to program exercise sessions.          Exercise Comments:   Discharge Exercise Prescription (Final Exercise Prescription Changes):     Exercise Prescription Changes - 11/17/15 1800    Exercise Review   Progression Yes   Response to Exercise   Blood Pressure (Admit) 148/70 mmHg   Blood Pressure (Exercise) 158/78 mmHg   Blood Pressure (Exit) 142/76 mmHg   Heart Rate (Admit) 84 bpm   Heart Rate (Exercise) 88 bpm   Heart Rate (Exit) 82 bpm   Rating of Perceived Exertion (Exercise) 11   Perceived Dyspnea (Exercise) 9   Duration Progress to 30 minutes of continuous aerobic without signs/symptoms of physical distress   Intensity Rest + 20   Progression   Progression Continue to progress workloads to maintain intensity without signs/symptoms of physical distress.   Resistance Training   Training Prescription No   Weight 1   Reps 10-12   Treadmill   MPH 0   Grade 0   Minutes 0   METs 0   NuStep   Level 2   Watts 12   Minutes 15   METs 3.61   Arm Ergometer   Level 2.2   Watts 15   Minutes 20   METs 2   Home Exercise Plan   Plans to continue exercise at Home   Frequency Add 2 additional days to program exercise sessions.      Nutrition:  Target Goals: Understanding of nutrition guidelines, daily intake of sodium 1500mg , cholesterol 200mg , calories 30% from fat and 7% or less from saturated fats, daily to have 5 or more servings of fruits and vegetables.  Biometrics:     Pre Biometrics - 11/04/15 1610    Pre Biometrics   Height 5\' 10"  (1.778 m)   Weight 198 lb 6.6 oz (90 kg)   Waist Circumference 42 inches    Hip Circumference 41.5 inches   Waist to Hip Ratio 1.01 %   BMI (Calculated) 28.5   Triceps Skinfold 21 mm   % Body Fat 30.1 %   Grip Strength 80 kg   Flexibility 0 in   Single Leg Stand 12 seconds       Nutrition Therapy  Plan and Nutrition Goals:   Nutrition Discharge: Rate Your Plate Scores:     Nutrition Assessments - 11/04/15 1718    Rate Your Plate Scores   Pre Score 53      Psychosocial: Target Goals: Acknowledge presence or absence of depression, maximize coping skills, provide positive support system. Participant is able to verbalize types and ability to use techniques and skills needed for reducing stress and depression.  Initial Review & Psychosocial Screening:     Initial Psych Review & Screening - 11/04/15 1730    Initial Review   Current issues with --  N/A   Family Dynamics   Good Support System? Yes   Barriers   Psychosocial barriers to participate in program There are no identifiable barriers or psychosocial needs.   Screening Interventions   Interventions Encouraged to exercise      Quality of Life Scores:     Quality of Life - 11/04/15 1731    Quality of Life Scores   Health/Function Pre 21 %   Socioeconomic Pre 21 %   Psych/Spiritual Pre 21 %   Family Pre 21 %   GLOBAL Pre 21 %      PHQ-9:     Recent Review Flowsheet Data    Depression screen [facility] 2/9 11/04/2015   Decreased Interest 1   Down, Depressed, Hopeless 0   PHQ - 2 Score 1   Altered sleeping 0   Tired, decreased energy 2   Change in appetite 0   Feeling bad or failure about yourself  0   Trouble concentrating 0   Moving slowly or fidgety/restless 0   Suicidal thoughts 0   PHQ-9 Score 3   Difficult doing work/chores Somewhat difficult      Psychosocial Evaluation and Intervention:     Psychosocial Evaluation - 11/04/15 1730    Psychosocial Evaluation & Interventions   Interventions --  No referral needed. Patient is not depressed.    Continued Psychosocial  Services Needed No      Psychosocial Re-Evaluation:     Psychosocial Re-Evaluation      11/17/15 1904           Psychosocial Re-Evaluation   Interventions Encouraged to attend Cardiac Rehabilitation for the exercise       Continued Psychosocial Services Needed No          Education: Education Goals: Education classes will be provided on a weekly basis, covering required topics. Participant will state understanding/return demonstration of topics presented.  Learning Barriers/Preferences:     Learning Barriers/Preferences - 11/04/15 1618    Learning Barriers/Preferences   Learning Barriers None   Learning Preferences Video;Individual Instruction      Education Topics: How Lungs Work and Diseases: - Discuss the anatomy of the lungs and diseases that can affect the lungs, such as COPD.   Exercise: -Discuss the importance of exercise, FITT principles of exercise, normal and abnormal responses to exercise, and how to exercise safely.   Environmental Irritants: -Discuss types of environmental irritants and how to limit exposure to environmental irritants.   Meds/Inhalers and oxygen: - Discuss respiratory medications, definition of an inhaler and oxygen, and the proper way to use an inhaler and oxygen.   Energy Saving Techniques: - Discuss methods to conserve energy and decrease shortness of breath when performing activities of daily living.    Bronchial Hygiene / Breathing Techniques: - Discuss breathing mechanics, pursed-lip breathing technique,  proper posture, effective ways to clear airways, and other functional breathing techniques  Cleaning Equipment: - Provides group verbal and written instruction about the health risks of elevated stress, cause of high stress, and healthy ways to reduce stress.   Nutrition I: Fats: - Discuss the types of cholesterol, what cholesterol does to the body, and how cholesterol levels can be controlled.          PULMONARY  REHAB OTHER RESPIRATORY from 11/13/2015 in [place]   Date  11/13/15   Educator  Russella Dar   Instruction Review Code  2- meets goals/outcomes      Nutrition II: Labels: -Discuss the different components of food labels and how to read food labels.   Respiratory Infections: - Discuss the signs and symptoms of respiratory infections, ways to prevent respiratory infections, and the importance of seeking medical treatment when having a respiratory infection.   Stress I: Signs and Symptoms: - Discuss the causes of stress, how stress may lead to anxiety and depression, and ways to limit stress.   Stress II: Relaxation: -Discuss relaxation techniques to limit stress.   Oxygen for Home/Travel: - Discuss how to prepare for travel when on oxygen and proper ways to transport and store oxygen to ensure safety.   Knowledge Questionnaire Score:     Knowledge Questionnaire Score - 11/04/15 1709    Knowledge Questionnaire Score   Pre Score 9/14      Core Components/Risk Factors/Patient Goals at Admission:     Personal Goals and Risk Factors at Admission - 11/04/15 1719    Core Components/Risk Factors/Patient Goals on Admission    Weight Management Yes   Intervention Weight Management: Provide education and appropriate resources to help participant work on and attain dietary goals.   Admit Weight 198 lb (89.812 kg)   Goal Weight: Short Term 190 lb 8 oz (86.41 kg)   Goal Weight: Long Term 183 lb (83.008 kg)   Expected Outcomes Short Term: Continue to assess and modify interventions until short term weight is achieved;Long Term: Adherence to nutrition and physical activity/exercise program aimed toward attainment of established weight goal   Increase Strength and Stamina Yes   Intervention Provide advice, education, support and counseling about physical activity/exercise needs.;Develop an individualized exercise prescription for aerobic and resistive training  based on initial evaluation findings, risk stratification, comorbidities and participant's personal goals.   Expected Outcomes Achievement of increased cardiorespiratory fitness and enhanced flexibility, muscular endurance and strength shown through measurements of functional capacity and personal statement of participant.   Improve shortness of breath with ADL's Yes   Intervention Provide education, individualized exercise plan and daily activity instruction to help decrease symptoms of SOB with activities of daily living.   Expected Outcomes Short Term: Achieves a reduction of symptoms when performing activities of daily living.   Develop more efficient breathing techniques such as purse lipped breathing and diaphragmatic breathing; and practicing self-pacing with activity Yes   Intervention Provide education, demonstration and support about specific breathing techniuqes utilized for more efficient breathing. Include techniques such as pursed lipped breathing, diaphragmatic breathing and self-pacing activity.   Expected Outcomes Short Term: Participant will be able to demonstrate and use breathing techniques as needed throughout daily activities.   Increase knowledge of respiratory medications and ability to use respiratory devices properly  Yes   Intervention Provide education and demonstration as needed of appropriate use of medications, inhalers, and oxygen therapy.   Expected Outcomes Short Term: Achieves understanding of medications use. Understands that oxygen is a medication prescribed by physician. Demonstrates appropriate use of  inhaler and oxygen therapy.   Personal Goal Other Yes   Personal Goal Lose 5-15lbs within 12 weeks of PR, breath better, better health overall   Intervention Exercise 2 days week in program and supplement 3 more day at home.    Expected Outcomes Better breathing, weight loss      Core Components/Risk Factors/Patient Goals Review:      Goals and Risk Factor  Review      11/17/15 1901           Core Components/Risk Factors/Patient Goals Review   Personal Goals Review Develop more efficient breathing techniques such as purse lipped breathing and diaphragmatic breathing and practicing self-pacing with activity.;Weight Management/Obesity       Review Patient has not lost any weight yet. He is begining to breath better.        Expected Outcomes Lose weight, breath better, overall better health          Core Components/Risk Factors/Patient Goals at Discharge (Final Review):      Goals and Risk Factor Review - 11/17/15 1901    Core Components/Risk Factors/Patient Goals Review   Personal Goals Review Develop more efficient breathing techniques such as purse lipped breathing and diaphragmatic breathing and practicing self-pacing with activity.;Weight Management/Obesity   Review Patient has not lost any weight yet. He is begining to breath better.    Expected Outcomes Lose weight, breath better, overall better health      ITP Comments:   Comments: ITP REVIEW Pt is making expected progress toward personal goals after completing 3 sessions.   Recommend continued exercise, life style modification, education, and utilization of breathing techniques to increase stamina and strength and decrease shortness of breath with exertion.

## 2015-11-18 ENCOUNTER — Encounter (HOSPITAL_COMMUNITY)
Admission: RE | Admit: 2015-11-18 | Discharge: 2015-11-18 | Disposition: A | Discharge status: Home / Self Care | Payer: Medicare Other | Source: Ambulatory Visit | Attending: Internal Medicine | Admitting: Internal Medicine

## 2015-11-18 DIAGNOSIS — J449 Chronic obstructive pulmonary disease, unspecified: Secondary | ICD-10-CM | POA: Diagnosis not present

## 2015-11-18 NOTE — Progress Notes (Signed)
Daily Session Note  Patient Details  Name: Joel Perez MRN: [id number] Date of Birth: [birth year] Referring Provider:        PULMONARY REHAB COPD ORIENTATION from 11/04/2015 in [place]   Referring Provider  Dr. [name]      Encounter Date: 11/18/2015  Check In:     Session Check In - 11/18/15 1330    Check-In   Location [facility]   Staff Present Diane Angelina Pih, MS, EP, [facility], Exercise Physiologist;Gregory Luther Parody, BS, EP, Exercise Physiologist   Supervising physician immediately available to respond to emergencies See telemetry face sheet for immediately available MD   Medication changes reported     No   Fall or balance concerns reported    No   Warm-up and Cool-down Performed as group-led instruction   Resistance Training Performed Yes   VAD Patient? No   Pain Assessment   Currently in Pain? No/denies   Pain Score 0-No pain   Multiple Pain Sites No      Capillary Blood Glucose: No results found for this or any previous visit (from the past 24 hour(s)).      Exercise Prescription Changes - 11/17/15 1800    Exercise Review   Progression Yes   Response to Exercise   Blood Pressure (Admit) 148/70 mmHg   Blood Pressure (Exercise) 158/78 mmHg   Blood Pressure (Exit) 142/76 mmHg   Heart Rate (Admit) 84 bpm   Heart Rate (Exercise) 88 bpm   Heart Rate (Exit) 82 bpm   Rating of Perceived Exertion (Exercise) 11   Perceived Dyspnea (Exercise) 9   Duration Progress to 30 minutes of continuous aerobic without signs/symptoms of physical distress   Intensity Rest + 20   Progression   Progression Continue to progress workloads to maintain intensity without signs/symptoms of physical distress.   Resistance Training   Training Prescription No   Weight 1   Reps 10-12   Treadmill   MPH 0   Grade 0   Minutes 0   METs 0   NuStep   Level 2   Watts 12   Minutes 15   METs 3.61   Arm Ergometer   Level 2.2   Watts 15   Minutes 20   METs 2    Home Exercise Plan   Plans to continue exercise at Home   Frequency Add 2 additional days to program exercise sessions.     Goals Met:  Independence with exercise equipment Exercise tolerated well No report of cardiac concerns or symptoms Strength training completed today  Goals Unmet:  Not Applicable  Comments: Check out 230   Dr. Kate Sable is Medical Director for [place] and Pulmonary Rehab.

## 2015-11-20 ENCOUNTER — Encounter (HOSPITAL_COMMUNITY)
Admission: RE | Admit: 2015-11-20 | Discharge: 2015-11-20 | Disposition: A | Discharge status: Home / Self Care | Payer: Medicare Other | Source: Ambulatory Visit | Attending: Internal Medicine | Admitting: Internal Medicine

## 2015-11-20 DIAGNOSIS — J449 Chronic obstructive pulmonary disease, unspecified: Secondary | ICD-10-CM | POA: Diagnosis not present

## 2015-11-20 NOTE — Progress Notes (Signed)
Daily Session Note  Patient Details  Name: Joel Perez MRN: [id number] Date of Birth: [birth year] Referring Provider:        PULMONARY REHAB COPD ORIENTATION from 11/04/2015 in [place]   Referring Provider  Dr. [name]      Encounter Date: 11/20/2015  Check In:     Session Check In - 11/20/15 1330    Check-In   Location [facility]   Staff Present Diane Angelina Pih, MS, EP, [facility], Exercise Physiologist;Gregory Luther Parody, BS, EP, Exercise Physiologist   Supervising physician immediately available to respond to emergencies See telemetry face sheet for immediately available MD   Medication changes reported     No   Fall or balance concerns reported    No   Warm-up and Cool-down Performed as group-led instruction   Resistance Training Performed Yes   VAD Patient? No   Pain Assessment   Currently in Pain? No/denies   Pain Score 0-No pain   Multiple Pain Sites No      Capillary Blood Glucose: No results found for this or any previous visit (from the past 24 hour(s)).   Goals Met:  Independence with exercise equipment Exercise tolerated well No report of cardiac concerns or symptoms Strength training completed today  Goals Unmet:  Not Applicable  Comments: Check out 230   Dr. Kate Sable is Medical Director for [place] and Pulmonary Rehab.

## 2015-11-25 ENCOUNTER — Encounter (HOSPITAL_COMMUNITY): Payer: Medicare Other

## 2015-11-27 ENCOUNTER — Encounter (HOSPITAL_COMMUNITY)
Admission: RE | Admit: 2015-11-27 | Discharge: 2015-11-27 | Disposition: A | Discharge status: Home / Self Care | Payer: Medicare Other | Source: Ambulatory Visit | Attending: Internal Medicine | Admitting: Internal Medicine

## 2015-11-27 DIAGNOSIS — J449 Chronic obstructive pulmonary disease, unspecified: Secondary | ICD-10-CM | POA: Insufficient documentation

## 2015-11-27 NOTE — Progress Notes (Signed)
Daily Session Note  Patient Details  Name: Joel Perez MRN: [id number] Date of Birth: Feb 29, 1946 Referring Provider:        PULMONARY REHAB COPD ORIENTATION from 11/04/2015 in [place]   Referring Provider  Dr. [name]      Encounter Date: 11/27/2015  Check In:     Session Check In - 11/27/15 1330    Check-In   Location [facility]   Staff Present Diane Angelina Pih, MS, EP, [facility], Exercise Physiologist;Gregory Luther Parody, BS, EP, Exercise Physiologist;Debra Wynetta Emery, RN, BSN   Supervising physician immediately available to respond to emergencies See telemetry face sheet for immediately available MD   Medication changes reported     No   Fall or balance concerns reported    No   Warm-up and Cool-down Performed as group-led instruction   Resistance Training Performed Yes   VAD Patient? No   Pain Assessment   Currently in Pain? No/denies   Pain Score 0-No pain   Multiple Pain Sites No      Capillary Blood Glucose: No results found for this or any previous visit (from the past 24 hour(s)).      Exercise Prescription Changes - 11/27/15 0700    Exercise Review   Progression Yes   Response to Exercise   Blood Pressure (Admit) 140/76 mmHg   Blood Pressure (Exercise) 166/80 mmHg   Blood Pressure (Exit) 132/78 mmHg   Heart Rate (Admit) 8488 bpm   Heart Rate (Exercise) 90 bpm   Heart Rate (Exit) 89 bpm   Rating of Perceived Exertion (Exercise) 11   Perceived Dyspnea (Exercise) 11   Comments Patient is being moved from arm ergometer to treadmill.   Duration Progress to 30 minutes of continuous aerobic without signs/symptoms of physical distress   Intensity Rest + 20   Progression   Progression Continue to progress workloads to maintain intensity without signs/symptoms of physical distress.   Resistance Training   Training Prescription No   Weight 1   Reps 10-12   Treadmill   MPH 0   Grade 0   Minutes 0   METs 0   NuStep   Level 2   Watts 12    Minutes 15   METs 3.61   Arm Ergometer   Level 2.2   Watts 14   Minutes 20   METs 2.4   Home Exercise Plan   Plans to continue exercise at Home   Frequency Add 2 additional days to program exercise sessions.     Goals Met:  Independence with exercise equipment Exercise tolerated well No report of cardiac concerns or symptoms Strength training completed today  Goals Unmet:  Not Applicable  Comments: Check out 230   Dr. Fransico Him is Medical Director for Cardiac Rehab at [facility].

## 2015-12-02 ENCOUNTER — Encounter (HOSPITAL_COMMUNITY)
Admission: RE | Admit: 2015-12-02 | Discharge: 2015-12-02 | Disposition: A | Discharge status: Home / Self Care | Payer: Medicare Other | Source: Ambulatory Visit | Attending: Internal Medicine | Admitting: Internal Medicine

## 2015-12-02 DIAGNOSIS — J449 Chronic obstructive pulmonary disease, unspecified: Secondary | ICD-10-CM | POA: Diagnosis not present

## 2015-12-02 NOTE — Progress Notes (Signed)
Daily Session Note  Patient Details  Name: Joel Perez MRN: [id number] Date of Birth: 02/12/1946 Referring Provider:        PULMONARY REHAB COPD ORIENTATION from 11/04/2015 in [place]   Referring Provider  Dr. [name]      Encounter Date: 12/02/2015  Check In:     Session Check In - 12/02/15 1330    Check-In   Location [facility]   Staff Present Suzanne Boron, BS, EP, Exercise Physiologist;Debra Wynetta Emery, RN, BSN   Supervising physician immediately available to respond to emergencies See telemetry face sheet for immediately available MD   Medication changes reported     No   Fall or balance concerns reported    No   Warm-up and Cool-down Performed as group-led instruction   Resistance Training Performed Yes   VAD Patient? No   Pain Assessment   Currently in Pain? No/denies   Pain Score 0-No pain   Multiple Pain Sites No      Capillary Blood Glucose: No results found for this or any previous visit (from the past 24 hour(s)).   Goals Met:  Independence with exercise equipment Exercise tolerated well No report of cardiac concerns or symptoms Strength training completed today  Goals Unmet:  Not Applicable  Comments: Check out 1430   Dr. Kate Sable is Medical Director for [place] and Pulmonary Rehab.

## 2015-12-04 ENCOUNTER — Encounter (HOSPITAL_COMMUNITY)
Admission: RE | Admit: 2015-12-04 | Discharge: 2015-12-04 | Disposition: A | Discharge status: Home / Self Care | Payer: Medicare Other | Source: Ambulatory Visit | Attending: Internal Medicine | Admitting: Internal Medicine

## 2015-12-04 DIAGNOSIS — J449 Chronic obstructive pulmonary disease, unspecified: Secondary | ICD-10-CM | POA: Diagnosis not present

## 2015-12-04 NOTE — Progress Notes (Signed)
Daily Session Note  Patient Details  Name: Joel Perez MRN: [id number] Date of Birth: 03-Oct-1945 Referring Provider:        PULMONARY REHAB COPD ORIENTATION from 11/04/2015 in [place]   Referring Provider  Dr. [name]      Encounter Date: 12/04/2015  Check In:     Session Check In - 12/04/15 1323    Check-In   Location [facility]   Staff Present Russella Dar, MS, EP, [facility], Exercise Physiologist;Gregory Luther Parody, BS, EP, Exercise Physiologist   Supervising physician immediately available to respond to emergencies See telemetry face sheet for immediately available MD   Medication changes reported     No   Fall or balance concerns reported    No   Warm-up and Cool-down Performed as group-led instruction   Resistance Training Performed Yes   VAD Patient? No   Pain Assessment   Currently in Pain? No/denies   Pain Score 0-No pain   Multiple Pain Sites No      Capillary Blood Glucose: No results found for this or any previous visit (from the past 24 hour(s)).   Goals Met:  Independence with exercise equipment Exercise tolerated well No report of cardiac concerns or symptoms Strength training completed today  Goals Unmet:  Not Applicable  Comments: Check out 230   Dr. Kate Sable is Medical Director for [place] and Pulmonary Rehab.

## 2015-12-09 ENCOUNTER — Encounter (HOSPITAL_COMMUNITY)
Admission: RE | Admit: 2015-12-09 | Discharge: 2015-12-09 | Disposition: A | Discharge status: Home / Self Care | Payer: Medicare Other | Source: Ambulatory Visit | Attending: Internal Medicine | Admitting: Internal Medicine

## 2015-12-09 DIAGNOSIS — J449 Chronic obstructive pulmonary disease, unspecified: Secondary | ICD-10-CM | POA: Diagnosis not present

## 2015-12-09 NOTE — Progress Notes (Signed)
Daily Session Note  Patient Details  Name: Joel Perez MRN: [id number] Date of Birth: 1945/08/13 Referring Provider:        PULMONARY REHAB COPD ORIENTATION from 11/04/2015 in [place]   Referring Provider  Dr. [name]      Encounter Date: 12/09/2015  Check In:     Session Check In - 12/09/15 1330    Check-In   Location [facility]   Staff Present Russella Dar, MS, EP, [facility], Exercise Physiologist;Gregory Luther Parody, BS, EP, Exercise Physiologist   Supervising physician immediately available to respond to emergencies See telemetry face sheet for immediately available MD   Medication changes reported     No   Fall or balance concerns reported    No   Warm-up and Cool-down Performed as group-led instruction   Resistance Training Performed Yes   VAD Patient? No   Pain Assessment   Currently in Pain? No/denies   Pain Score 0-No pain   Multiple Pain Sites No      Capillary Blood Glucose: No results found for this or any previous visit (from the past 24 hour(s)).      Exercise Prescription Changes - 12/08/15 1400    Exercise Review   Progression Yes   Response to Exercise   Blood Pressure (Admit) 132/70 mmHg   Blood Pressure (Exercise) 154/76 mmHg   Blood Pressure (Exit) 132/76 mmHg   Heart Rate (Admit) 92 bpm   Heart Rate (Exercise) 97 bpm   Heart Rate (Exit) 80 bpm   Oxygen Saturation (Admit) 91 %   Oxygen Saturation (Exercise) 93 %   Oxygen Saturation (Exit) 96 %   Rating of Perceived Exertion (Exercise) 11   Perceived Dyspnea (Exercise) 11   Duration Progress to 30 minutes of continuous aerobic without signs/symptoms of physical distress   Intensity Rest + 20   Progression   Progression Continue to progress workloads to maintain intensity without signs/symptoms of physical distress.   Resistance Training   Training Prescription No   Weight 5   Reps 10-12   Treadmill   MPH 1.4   Grade 0   Minutes 15   METs 2   NuStep   Level  3   Watts 9   Minutes 20   METs 3.61   Arm Ergometer   Level 0   Watts 0   Minutes 0   METs 0   Home Exercise Plan   Plans to continue exercise at Home   Frequency Add 2 additional days to program exercise sessions.     Goals Met:  Independence with exercise equipment Exercise tolerated well No report of cardiac concerns or symptoms Strength training completed today  Goals Unmet:  Not Applicable  Comments: Check out 230   Dr. Kate Sable is Medical Director for [place] and Pulmonary Rehab.

## 2015-12-11 ENCOUNTER — Encounter (HOSPITAL_COMMUNITY): Payer: Medicare Other

## 2015-12-16 ENCOUNTER — Encounter (HOSPITAL_COMMUNITY)
Admission: RE | Admit: 2015-12-16 | Discharge: 2015-12-16 | Disposition: A | Discharge status: Home / Self Care | Payer: Medicare Other | Source: Ambulatory Visit | Attending: Internal Medicine | Admitting: Internal Medicine

## 2015-12-16 DIAGNOSIS — J449 Chronic obstructive pulmonary disease, unspecified: Secondary | ICD-10-CM

## 2015-12-16 NOTE — Progress Notes (Signed)
Daily Session Note  Patient Details  Name: Joel Perez MRN: [id number] Date of Birth: 06-11-1945 Referring Provider:   Flowsheet Row PULMONARY REHAB COPD ORIENTATION from 11/04/2015 in [place]  Referring Provider  Dr. [name]      Encounter Date: 12/16/2015  Check In:     Session Check In - 12/16/15 1337      Check-In   Location [facility]   Staff Present Russella Dar, MS, EP, [facility], Exercise Physiologist;Gregory Luther Parody, BS, EP, Exercise Physiologist   Supervising physician immediately available to respond to emergencies See telemetry face sheet for immediately available MD   Medication changes reported     No   Fall or balance concerns reported    No   Warm-up and Cool-down Performed as group-led instruction   Resistance Training Performed Yes   VAD Patient? No     Pain Assessment   Currently in Pain? No/denies   Pain Score 0-No pain   Multiple Pain Sites No      Capillary Blood Glucose: No results found for this or any previous visit (from the past 24 hour(s)).   Goals Met:  Independence with exercise equipment Exercise tolerated well No report of cardiac concerns or symptoms Strength training completed today  Goals Unmet:  Not Applicable  Comments: Check out 230   Dr. Kate Sable is Medical Director for [place] and Pulmonary Rehab.

## 2015-12-18 ENCOUNTER — Encounter (HOSPITAL_COMMUNITY)
Admission: RE | Admit: 2015-12-18 | Discharge: 2015-12-18 | Disposition: A | Discharge status: Home / Self Care | Payer: Medicare Other | Source: Ambulatory Visit | Attending: Internal Medicine | Admitting: Internal Medicine

## 2015-12-18 DIAGNOSIS — J449 Chronic obstructive pulmonary disease, unspecified: Secondary | ICD-10-CM | POA: Diagnosis not present

## 2015-12-18 NOTE — Progress Notes (Signed)
Pulmonary Individual Treatment Plan  Patient Details  Name: Joel Perez MRN: [id number] Date of Birth: [birth year] Referring Provider:   Flowsheet Row PULMONARY REHAB COPD ORIENTATION from 11/04/2015 in [place]  Referring Provider  Dr. [name]      Initial Encounter Date:  Flowsheet Row PULMONARY REHAB COPD ORIENTATION from 11/04/2015 in [place]  Date  11/04/15  Referring Provider  Dr. [name]      Visit Diagnosis: COPD mixed type [facility])  Patient's Home Medications on Admission:   Current Outpatient Prescriptions:  .  albuterol (PROAIR HFA) 108 (90 BASE) MCG/ACT inhaler, INHALE 2 PUFFS INTO THE LUNGS EVERY 6 HOURS AS NEEDED FOR WHEEZING OR SHORTNESS OF BREATH, Disp: 1 each, Rfl: 11 .  albuterol (PROVENTIL) (2.5 MG/3ML) 0.083% nebulizer solution, Take 3 mLs (2.5 mg total) by nebulization every 6 (six) hours as needed for wheezing or shortness of breath., Disp: 75 mL, Rfl: 11 .  aspirin 81 MG tablet, Take 81 mg by mouth daily., Disp: , Rfl:  .  b complex vitamins tablet, Take 1 tablet by mouth daily., Disp: , Rfl:  .  Cyclobenzaprine HCl (FLEXERIL PO), Take 1 tablet by mouth 3 (three) times daily as needed., Disp: , Rfl:  .  Multiple Vitamin (MULTIVITAMIN) capsule, Take 1 capsule by mouth daily., Disp: , Rfl:  .  oxyCODONE-acetaminophen (PERCOCET/ROXICET) 5-325 MG per tablet, Take 1 tablet by mouth every 6 (six) hours as needed., Disp: , Rfl:  .  predniSONE (DELTASONE) 10 MG tablet, Take  4 each am x 2 days,   2 each am x 2 days,  1 each am x 2 days and stop (Patient not taking: Reported on 11/04/2015), Disp: 14 tablet, Rfl: 0 .  Sildenafil Citrate (VIAGRA PO), Take 1 tablet by mouth as needed., Disp: , Rfl:  .  sodium chloride (OCEAN) 0.65 % nasal spray, Place 1 spray into the nose as needed for congestion., Disp: , Rfl:  .  SPIRIVA RESPIMAT 2.5 MCG/ACT AERS, Take 2 puffs by mouth daily., Disp: , Rfl:  .  SYMBICORT 160-4.5 MCG/ACT inhaler,  Inhale 2 puffs into the lungs 2 (two) times daily., Disp: , Rfl:   Past Medical History: Past Medical History:  Diagnosis Date  . COPD (chronic obstructive pulmonary disease) ([place])   . Low back pain     Tobacco Use: History  Smoking Status  . Former Smoker  . Packs/day: 2.00  . Years: 50.00  . Types: Cigarettes  . Quit date: 08/23/2015  Smokeless Tobacco  . Never Used    Labs: Recent Review Flowsheet Data    There is no flowsheet data to display.      Capillary Blood Glucose: No results found for: GLUCAP   ADL UCSD:     Pulmonary Assessment Scores    Row Name 11/04/15 1713         ADL UCSD   ADL Phase Entry     SOB Score total 42     Rest 0     Walk 8     Stairs 4     Bath 0     Dress 1     Shop 2       CAT Score   CAT Score 15       mMRC Score   mMRC Score 3        Pulmonary Function Assessment:     Pulmonary Function Assessment - 11/04/15 1710      Pulmonary Function Tests  FVC% 76 %   FEV1% 62 %   FEV1/FVC Ratio 60     Post Bronchodilator Spirometry Results   FVC% 76 %   FEV1% 62 %   FEV1/FVC Ratio 60     Breath   Shortness of Breath Fear of Shortness of Breath;Yes      Exercise Target Goals:    Exercise Program Goal: Individual exercise prescription set with THRR, safety & activity barriers. Participant demonstrates ability to understand and report RPE using BORG scale, to self-measure pulse accurately, and to acknowledge the importance of the exercise prescription.  Exercise Prescription Goal: Starting with aerobic activity 30 plus minutes a day, 3 days per week for initial exercise prescription. Provide home exercise prescription and guidelines that participant acknowledges understanding prior to discharge.  Activity Barriers & Risk Stratification:   6 Minute Walk:     6 Minute Walk    Row Name 11/04/15 1620         6 Minute Walk   Phase Initial     Distance 1050 feet     Walk Time 6 minutes     # of Rest Breaks  0     MPH 1.98     METS 2.52     RPE 12     Perceived Dyspnea  13     VO2 Peak 9.09     Symptoms No     Resting HR 90 bpm     Resting BP 146/78     Max Ex. HR 102 bpm     Max Ex. BP 162/80     2 Minute Post BP 154/78        Initial Exercise Prescription:     Initial Exercise Prescription - 11/04/15 1600      Date of Initial Exercise RX and Referring Provider   Date 11/04/15   Referring Provider Dr. [name]     Treadmill   MPH 1.3   Grade 0   Minutes 15   METs 1.9     NuStep   Level 2   Watts 14   Minutes 15   METs 1.9     Arm Ergometer   Level 2   Watts 16   Minutes 15   METs 2.2     Prescription Details   Frequency (times per week) 2   Duration Progress to 30 minutes of continuous aerobic without signs/symptoms of physical distress     Intensity   THRR REST +  20   THRR 40-80% of Max Heartrate [id number]   Ratings of Perceived Exertion 11-13   Perceived Dyspnea 0-4     Progression   Progression Continue to progress workloads to maintain intensity without signs/symptoms of physical distress.     Resistance Training   Training Prescription Yes   Weight 1   Reps 10-12      Perform Capillary Blood Glucose checks as needed.  Exercise Prescription Changes:      Exercise Prescription Changes    Row Name 11/17/15 1800 11/27/15 0700 12/08/15 1400         Exercise Review   Progression Yes Yes Yes       Response to Exercise   Blood Pressure (Admit) 148/70 140/76 132/70     Blood Pressure (Exercise) 158/78 166/80 154/76     Blood Pressure (Exit) 142/76 132/78 132/76     Heart Rate (Admit) 84 bpm 8488 bpm 92 bpm     Heart Rate (Exercise) 88 bpm 90 bpm 97 bpm  Heart Rate (Exit) 82 bpm 89 bpm 80 bpm     Oxygen Saturation (Admit)  -  - 91 %     Oxygen Saturation (Exercise)  -  - 93 %     Oxygen Saturation (Exit)  -  - 96 %     Rating of Perceived Exertion (Exercise) 11 11 11      Perceived Dyspnea (Exercise) 9 11 11      Comments  - Patient is  being moved from arm ergometer to treadmill.  -     Duration Progress to 30 minutes of continuous aerobic without signs/symptoms of physical distress Progress to 30 minutes of continuous aerobic without signs/symptoms of physical distress Progress to 30 minutes of continuous aerobic without signs/symptoms of physical distress     Intensity Rest + 20 Rest + 20 Rest + 20       Progression   Progression Continue to progress workloads to maintain intensity without signs/symptoms of physical distress. Continue to progress workloads to maintain intensity without signs/symptoms of physical distress. Continue to progress workloads to maintain intensity without signs/symptoms of physical distress.       Resistance Training   Training Prescription No No No     Weight 1 1 5      Reps 10-12 10-12 10-12       Treadmill   MPH 0 0 1.4     Grade 0 0 0     Minutes 0 0 15     METs 0 0 2       NuStep   Level 2 2 3      Watts 12 12 9      Minutes 15 15 20      METs 3.61 3.61 3.61       Arm Ergometer   Level 2.2 2.2 0     Watts 15 14 0     Minutes 20 20 0     METs 2 2.4 0       Home Exercise Plan   Plans to continue exercise at [place]     Frequency Add 2 additional days to program exercise sessions. Add 2 additional days to program exercise sessions. Add 2 additional days to program exercise sessions.        Exercise Comments:      Exercise Comments    Row Name 12/17/15 0917           Exercise Comments Patient is proggressing appropriately           Discharge Exercise Prescription (Final Exercise Prescription Changes):     Exercise Prescription Changes - 12/08/15 1400      Exercise Review   Progression Yes     Response to Exercise   Blood Pressure (Admit) 132/70   Blood Pressure (Exercise) 154/76   Blood Pressure (Exit) 132/76   Heart Rate (Admit) 92 bpm   Heart Rate (Exercise) 97 bpm   Heart Rate (Exit) 80 bpm   Oxygen Saturation (Admit) 91 %   Oxygen Saturation  (Exercise) 93 %   Oxygen Saturation (Exit) 96 %   Rating of Perceived Exertion (Exercise) 11   Perceived Dyspnea (Exercise) 11   Duration Progress to 30 minutes of continuous aerobic without signs/symptoms of physical distress   Intensity Rest + 20     Progression   Progression Continue to progress workloads to maintain intensity without signs/symptoms of physical distress.     Resistance Training   Training Prescription No   Weight 5   Reps 10-12  Treadmill   MPH 1.4   Grade 0   Minutes 15   METs 2     NuStep   Level 3   Watts 9   Minutes 20   METs 3.61     Arm Ergometer   Level 0   Watts 0   Minutes 0   METs 0     Home Exercise Plan   Plans to continue exercise at Home   Frequency Add 2 additional days to program exercise sessions.      Nutrition:  Target Goals: Understanding of nutrition guidelines, daily intake of sodium 1500mg , cholesterol 200mg , calories 30% from fat and 7% or less from saturated fats, daily to have 5 or more servings of fruits and vegetables.  Biometrics:     Pre Biometrics - 11/04/15 1610      Pre Biometrics   Height 5\' 10"  (1.778 m)   Weight 198 lb 6.6 oz (90 kg)   Waist Circumference 42 inches   Hip Circumference 41.5 inches   Waist to Hip Ratio 1.01 %   BMI (Calculated) 28.5   Triceps Skinfold 21 mm   % Body Fat 30.1 %   Grip Strength 80 kg   Flexibility 0 in   Single Leg Stand 12 seconds       Nutrition Therapy Plan and Nutrition Goals:   Nutrition Discharge: Rate Your Plate Scores:     Nutrition Assessments - 11/04/15 1718      Rate Your Plate Scores   Pre Score 53      Psychosocial: Target Goals: Acknowledge presence or absence of depression, maximize coping skills, provide positive support system. Participant is able to verbalize types and ability to use techniques and skills needed for reducing stress and depression.  Initial Review & Psychosocial Screening:     Initial Psych Review & Screening -  11/04/15 1730      Initial Review   Current issues with --  N/A     Family Dynamics   Good Support System? Yes     Barriers   Psychosocial barriers to participate in program There are no identifiable barriers or psychosocial needs.     Screening Interventions   Interventions Encouraged to exercise      Quality of Life Scores:     Quality of Life - 11/04/15 1731      Quality of Life Scores   Health/Function Pre 21 %   Socioeconomic Pre 21 %   Psych/Spiritual Pre 21 %   Family Pre 21 %   GLOBAL Pre 21 %      PHQ-9: Recent Review Flowsheet Data    Depression screen [facility] 2/9 11/04/2015   Decreased Interest 1   Down, Depressed, Hopeless 0   PHQ - 2 Score 1   Altered sleeping 0   Tired, decreased energy 2   Change in appetite 0   Feeling bad or failure about yourself  0   Trouble concentrating 0   Moving slowly or fidgety/restless 0   Suicidal thoughts 0   PHQ-9 Score 3   Difficult doing work/chores Somewhat difficult      Psychosocial Evaluation and Intervention:     Psychosocial Evaluation - 11/04/15 1730      Psychosocial Evaluation & Interventions   Interventions --  No referral needed. Patient is not depressed.    Continued Psychosocial Services Needed No      Psychosocial Re-Evaluation:     Psychosocial Re-Evaluation    [place] Name 11/17/15 1904 12/18/15 [id number]  Psychosocial Re-Evaluation   Interventions Encouraged to attend Cardiac Rehabilitation for the exercise  -      Comments  - Patient has no psychosocial issues and does not need counseling.       Continued Psychosocial Services Needed No No         Education: Education Goals: Education classes will be provided on a weekly basis, covering required topics. Participant will state understanding/return demonstration of topics presented.  Learning Barriers/Preferences:     Learning Barriers/Preferences - 11/04/15 1618      Learning Barriers/Preferences   Learning Barriers None    Learning Preferences Video;Individual Instruction      Education Topics: How Lungs Work and Diseases: - Discuss the anatomy of the lungs and diseases that can affect the lungs, such as COPD.   Exercise: -Discuss the importance of exercise, FITT principles of exercise, normal and abnormal responses to exercise, and how to exercise safely.   Environmental Irritants: -Discuss types of environmental irritants and how to limit exposure to environmental irritants.   Meds/Inhalers and oxygen: - Discuss respiratory medications, definition of an inhaler and oxygen, and the proper way to use an inhaler and oxygen.   Energy Saving Techniques: - Discuss methods to conserve energy and decrease shortness of breath when performing activities of daily living.    Bronchial Hygiene / Breathing Techniques: - Discuss breathing mechanics, pursed-lip breathing technique,  proper posture, effective ways to clear airways, and other functional breathing techniques   Cleaning Equipment: - Provides group verbal and written instruction about the health risks of elevated stress, cause of high stress, and healthy ways to reduce stress.   Nutrition I: Fats: - Discuss the types of cholesterol, what cholesterol does to the body, and how cholesterol levels can be controlled. Flowsheet Row PULMONARY REHAB OTHER RESPIRATORY from 12/04/2015 in [place]  Date  11/13/15  Educator  Russella Dar  Instruction Review Code  2- meets goals/outcomes      Nutrition II: Labels: -Discuss the different components of food labels and how to read food labels. Flowsheet Row PULMONARY REHAB OTHER RESPIRATORY from 12/04/2015 in [place]  Date  11/20/15  Educator  Russella Dar  Instruction Review Code  2- meets goals/outcomes      Respiratory Infections: - Discuss the signs and symptoms of respiratory infections, ways to prevent respiratory infections, and the importance of  seeking medical treatment when having a respiratory infection. Flowsheet Row PULMONARY REHAB OTHER RESPIRATORY from 12/04/2015 in [place]  Date  11/28/15  Educator  DC  Instruction Review Code  2- meets goals/outcomes      Stress I: Signs and Symptoms: - Discuss the causes of stress, how stress may lead to anxiety and depression, and ways to limit stress. Flowsheet Row PULMONARY REHAB OTHER RESPIRATORY from 12/04/2015 in [place]  Date  12/04/15  Educator  DC  Instruction Review Code  2- meets goals/outcomes      Stress II: Relaxation: -Discuss relaxation techniques to limit stress.   Oxygen for Home/Travel: - Discuss how to prepare for travel when on oxygen and proper ways to transport and store oxygen to ensure safety.   Knowledge Questionnaire Score:     Knowledge Questionnaire Score - 11/04/15 1709      Knowledge Questionnaire Score   Pre Score 9/14      Core Components/Risk Factors/Patient Goals at Admission:     Personal Goals and Risk Factors at Admission - 11/04/15 1719  Core Components/Risk Factors/Patient Goals on Admission    Weight Management Yes   Intervention Weight Management: Provide education and appropriate resources to help participant work on and attain dietary goals.   Admit Weight 198 lb (89.8 kg)   Goal Weight: Short Term 190 lb 8 oz (86.4 kg)   Goal Weight: Long Term 183 lb (83 kg)   Expected Outcomes Short Term: Continue to assess and modify interventions until short term weight is achieved;Long Term: Adherence to nutrition and physical activity/exercise program aimed toward attainment of established weight goal   Increase Strength and Stamina Yes   Intervention Provide advice, education, support and counseling about physical activity/exercise needs.;Develop an individualized exercise prescription for aerobic and resistive training based on initial evaluation findings, risk stratification,  comorbidities and participant's personal goals.   Expected Outcomes Achievement of increased cardiorespiratory fitness and enhanced flexibility, muscular endurance and strength shown through measurements of functional capacity and personal statement of participant.   Improve shortness of breath with ADL's Yes   Intervention Provide education, individualized exercise plan and daily activity instruction to help decrease symptoms of SOB with activities of daily living.   Expected Outcomes Short Term: Achieves a reduction of symptoms when performing activities of daily living.   Develop more efficient breathing techniques such as purse lipped breathing and diaphragmatic breathing; and practicing self-pacing with activity Yes   Intervention Provide education, demonstration and support about specific breathing techniuqes utilized for more efficient breathing. Include techniques such as pursed lipped breathing, diaphragmatic breathing and self-pacing activity.   Expected Outcomes Short Term: Participant will be able to demonstrate and use breathing techniques as needed throughout daily activities.   Increase knowledge of respiratory medications and ability to use respiratory devices properly  Yes   Intervention Provide education and demonstration as needed of appropriate use of medications, inhalers, and oxygen therapy.   Expected Outcomes Short Term: Achieves understanding of medications use. Understands that oxygen is a medication prescribed by physician. Demonstrates appropriate use of inhaler and oxygen therapy.   Personal Goal Other Yes   Personal Goal Lose 5-15lbs within 12 weeks of PR, breath better, better health overall   Intervention Exercise 2 days week in program and supplement 3 more day at home.    Expected Outcomes Better breathing, weight loss      Core Components/Risk Factors/Patient Goals Review:      Goals and Risk Factor Review    Row Name 11/17/15 1901 12/18/15 [id number]            Core Components/Risk Factors/Patient Goals Review   Personal Goals Review Develop more efficient breathing techniques such as purse lipped breathing and diaphragmatic breathing and practicing self-pacing with activity.;Weight Management/Obesity Weight Management/Obesity;Improve shortness of breath with ADL's;Increase Strength and Stamina;Other  Overall better health.      Review Patient has not lost any weight yet. He is begining to breath better.  Patient has lost 3 lbs since starting the program. He is able to breathe better with increased strenght and stamina.       Expected Outcomes Lose weight, breath better, overall better health Patient will continue to lose weight and increase his strenght and stamina with improved SOB.          Core Components/Risk Factors/Patient Goals at Discharge (Final Review):      Goals and Risk Factor Review - 12/18/15 0824      Core Components/Risk Factors/Patient Goals Review   Personal Goals Review Weight Management/Obesity;Improve shortness of breath with ADL's;Increase Strength and Stamina;Other  Overall better health.   Review Patient has lost 3 lbs since starting the program. He is able to breathe better with increased strenght and stamina.    Expected Outcomes Patient will continue to lose weight and increase his strenght and stamina with improved SOB.       ITP Comments:   Comments: Patient continues to progress towards personal goals after 10 sessions. Recommend continued exercise, lifestyle modifications, education and utilization of breathing exercises to increase stamina and strength and decrease SOB with exertion.

## 2015-12-18 NOTE — Progress Notes (Signed)
Daily Session Note  Patient Details  Name: Joel Perez MRN: [id number] Date of Birth: 10-Oct-1945 Referring Provider:   Flowsheet Row PULMONARY REHAB COPD ORIENTATION from 11/04/2015 in [place]  Referring Provider  Dr. [name]      Encounter Date: 12/18/2015  Check In:     Session Check In - 12/18/15 1320      Check-In   Location [facility]   Staff Present Diane Angelina Pih, MS, EP, [facility], Exercise Physiologist;Debra Wynetta Emery, RN, BSN;Gregory Cowan, BS, EP, Exercise Physiologist   Supervising physician immediately available to respond to emergencies See telemetry face sheet for immediately available MD   Medication changes reported     No   Fall or balance concerns reported    No   Warm-up and Cool-down Performed as group-led instruction   Resistance Training Performed Yes   VAD Patient? No     Pain Assessment   Currently in Pain? No/denies   Pain Score 0-No pain   Multiple Pain Sites No      Capillary Blood Glucose: No results found for this or any previous visit (from the past 24 hour(s)).   Goals Met:  Independence with exercise equipment Exercise tolerated well No report of cardiac concerns or symptoms Strength training completed today  Goals Unmet:  Not Applicable  Comments: Check out 230   Dr. Kate Sable is Medical Director for [place] and Pulmonary Rehab.

## 2015-12-23 ENCOUNTER — Encounter (HOSPITAL_COMMUNITY)
Admission: RE | Admit: 2015-12-23 | Discharge: 2015-12-23 | Disposition: A | Discharge status: Home / Self Care | Payer: Medicare Other | Source: Ambulatory Visit | Attending: Internal Medicine | Admitting: Internal Medicine

## 2015-12-23 DIAGNOSIS — J449 Chronic obstructive pulmonary disease, unspecified: Secondary | ICD-10-CM | POA: Diagnosis present

## 2015-12-23 NOTE — Progress Notes (Signed)
Daily Session Note  Patient Details  Name: TOBEY SCHMELZLE MRN: [id number] Date of Birth: 01-21-1946 Referring Provider:   Flowsheet Row PULMONARY REHAB COPD ORIENTATION from 11/04/2015 in [place]  Referring Provider  Dr. [name]      Encounter Date: 12/23/2015  Check In:     Session Check In - 12/23/15 1330      Check-In   Location [facility]   Staff Present Russella Dar, MS, EP, [facility], Exercise Physiologist;Gregory Luther Parody, BS, EP, Exercise Physiologist   Supervising physician immediately available to respond to emergencies See telemetry face sheet for immediately available MD   Medication changes reported     No   Fall or balance concerns reported    No   Warm-up and Cool-down Performed as group-led instruction   Resistance Training Performed Yes   VAD Patient? No     Pain Assessment   Currently in Pain? No/denies   Pain Score 0-No pain   Multiple Pain Sites No      Capillary Blood Glucose: No results found for this or any previous visit (from the past 24 hour(s)).   Goals Met:  Independence with exercise equipment Exercise tolerated well No report of cardiac concerns or symptoms Strength training completed today  Goals Unmet:  Not Applicable  Comments: Check out 230   Dr. Kate Sable is Medical Director for [place] and Pulmonary Rehab.

## 2015-12-25 ENCOUNTER — Encounter (HOSPITAL_COMMUNITY)
Admission: RE | Admit: 2015-12-25 | Discharge: 2015-12-25 | Disposition: A | Discharge status: Home / Self Care | Payer: Medicare Other | Source: Ambulatory Visit | Attending: Internal Medicine | Admitting: Internal Medicine

## 2015-12-25 DIAGNOSIS — J449 Chronic obstructive pulmonary disease, unspecified: Secondary | ICD-10-CM | POA: Diagnosis not present

## 2015-12-25 NOTE — Progress Notes (Signed)
Daily Session Note  Patient Details  Name: Joel Perez MRN: [id number] Date of Birth: Jul 25, 1945 Referring Provider:   Flowsheet Row PULMONARY REHAB COPD ORIENTATION from 11/04/2015 in [place]  Referring Provider  Dr. [name]      Encounter Date: 12/25/2015  Check In:     Session Check In - 12/25/15 1327      Check-In   Location [facility]   Staff Present Diane Angelina Pih, MS, EP, [facility], Exercise Physiologist;Debra Wynetta Emery, RN, BSN;Gregory Cowan, BS, EP, Exercise Physiologist   Supervising physician immediately available to respond to emergencies See telemetry face sheet for immediately available MD   Medication changes reported     No   Fall or balance concerns reported    No   Warm-up and Cool-down Performed as group-led instruction   Resistance Training Performed Yes   VAD Patient? No     Pain Assessment   Currently in Pain? No/denies   Pain Score 0-No pain   Multiple Pain Sites No      Capillary Blood Glucose: No results found for this or any previous visit (from the past 24 hour(s)).   Goals Met:  Independence with exercise equipment Exercise tolerated well No report of cardiac concerns or symptoms Strength training completed today  Goals Unmet:  Not Applicable  Comments: Check out 230   Dr. Kate Sable is Medical Director for [place] and Pulmonary Rehab.

## 2015-12-30 ENCOUNTER — Encounter: Payer: Self-pay | Admitting: Internal Medicine

## 2015-12-30 ENCOUNTER — Ambulatory Visit: Payer: Medicare Other | Admitting: Internal Medicine

## 2015-12-30 ENCOUNTER — Ambulatory Visit (INDEPENDENT_AMBULATORY_CARE_PROVIDER_SITE_OTHER): Payer: Medicare Other | Admitting: Internal Medicine

## 2015-12-30 ENCOUNTER — Encounter (HOSPITAL_COMMUNITY)
Admission: RE | Admit: 2015-12-30 | Discharge: 2015-12-30 | Disposition: A | Discharge status: Home / Self Care | Payer: Medicare Other | Source: Ambulatory Visit | Attending: Internal Medicine | Admitting: Internal Medicine

## 2015-12-30 VITALS — BP 130/90 | HR 74 | Ht 70.5 in | Wt 198.0 lb

## 2015-12-30 DIAGNOSIS — J449 Chronic obstructive pulmonary disease, unspecified: Secondary | ICD-10-CM | POA: Diagnosis not present

## 2015-12-30 DIAGNOSIS — Z720 Tobacco use: Secondary | ICD-10-CM | POA: Diagnosis not present

## 2015-12-30 DIAGNOSIS — F17210 Nicotine dependence, cigarettes, uncomplicated: Secondary | ICD-10-CM

## 2015-12-30 MED ORDER — TIOTROPIUM BROMIDE MONOHYDRATE 2.5 MCG/ACT IN AERS: 2.0000 | INHALATION_SPRAY | Freq: Every day | RESPIRATORY_TRACT | 0 refills | Status: DC

## 2015-12-30 MED ORDER — BUDESONIDE-FORMOTEROL FUMARATE 160-4.5 MCG/ACT IN AERO: 2.0000 | INHALATION_SPRAY | Freq: Two times a day (BID) | RESPIRATORY_TRACT | 0 refills | Status: DC

## 2015-12-30 MED ORDER — PREDNISONE 10 MG PO TABS: ORAL_TABLET | ORAL | 0 refills | Status: DC

## 2015-12-30 NOTE — Patient Instructions (Addendum)
Prednisone 10 mg take  4 each am x 2 days,   2 each am x 2 days,  1 each am x 2 days and stop   No change in medications   Please schedule a follow up visit in 3 months but call sooner if needed

## 2015-12-30 NOTE — Assessment & Plan Note (Signed)
Changed exclusively  to e cigs 07/2015  Reinforced need to stay of cigs and taper off e cigs if possible at this point

## 2015-12-30 NOTE — Addendum Note (Signed)
Addended by: Maryanna Shape A on: 12/30/2015 02:23 PM   Modules accepted: Orders

## 2015-12-30 NOTE — Assessment & Plan Note (Signed)
-   02/09/2013  FEV1 2.14 (68%) ratio 60 p 30% better from B2 and dloc 52 corrects to 69 - 02/07/2015  extensive coaching HFA effectiveness =    90% from a baseline of 75%  - 02/07/2015  Walked RA x 3 laps @ 185 ft each stopped due to  End of study, nl pace, no sob or desat   - PFT's  03/21/2015  FEV1 1.92 (62 % ) ratio 60  p 14 % improvement from saba with DLCO  47 % corrects to 62 % for alv volume   - trial of spiriva respimat 03/21/2015  With goal to be able to walk up inclines > no better 06/23/2015 > try bevespi 2 bid x 3 weeks  -sob   Worse p 3 weeks bevespi  so resumed symb/spirva 06/25/2015  - quite smoking 07/2015> e cigs - referred to rehab 09/22/2015 >>>  - 12/30/2015  After extensive coaching HFA effectiveness =    90%   Despite smoking cessation and optimal hfa / ics use still has significant AB component - rec Prednisone 10 mg take  4 each am x 2 days,   2 each am x 2 days,  1 each am x 2 days and stop   No change laba/lama/ics combo for now and prn saba  I had an extended discussion with the patient reviewing all relevant studies completed to date and  lasting 15 to 20 minutes of a 25 minute visit    Each maintenance medication was reviewed in detail including most importantly the difference between maintenance and prns and under what circumstances the prns are to be triggered using an action plan format that is not reflected in the computer generated alphabetically organized AVS.    Please see instructions for details which were reviewed in writing and the patient given a copy highlighting the part that I personally wrote and discussed at today's ov.

## 2015-12-30 NOTE — Progress Notes (Signed)
Subjective:    Patient ID: Joel Perez, male    DOB: 10-18-1945   MRN: [id number]    Brief patient profile:   40 yowm active smoker referred by Dr Moreen Fowler for eval of copd with GOLD II criteria established 02/09/13    History of Present Illness  12/27/2012 1st pulmonary eval/ Wert cc onset x 2009 am cough cough/ congestion and progresive doe with white mucus x sev tbsp x sev min better p proaire and maintained on spiriva which he feels doesn't work as well as advair. Doe x one flight steps, dragging the garbage to street stops x 2. He denies cough to me but actually has a rattling with cough maneuver but doesn't bring up much mucus, what he does bring up is mostly in ams and mucoid and some better since quit smoking  rec symbicort 160 Take 2 puffs first thing in am and then another 2 puffs about 12 hours later.  Only use your albuterol (proaire) as a rescue medication to be used if you can't catch your breath by resting or doing a relaxed purse lip breathing pattern. The less you use it, the better it will work when you need it.  As you improve ok to stop spiriva  Please schedule a follow up office visit in 6 weeks, call sooner if needed with pft's   02/09/2013 f/u ov/Wert re: GOLD II with reversibilty Chief Complaint  Patient presents with  . Follow-up    W/ PFT. Pt reports his breathing is better. He gets up tan color phlem up right after breathing tx  still rattling some but much better.  rec Continue symbicort 160 Take 2 puffs first thing in am and then another 2 puffs about 12 hours later.  Only use your proair as a rescue medication to be used if you can't catch your breath by resting or doing a relaxed purse lip breathing pattern. The less you use it, the better it will work when you need it.  Don't leave home without it [name] to leave off spiriva     03/21/2015  f/u ov/Wert re: COPD III with reversibilty on symb 160 2bid / still smoking Chief Complaint  Patient presents with  .  Follow-up    PFT done today. Breathing has improved since the last visit. He is usin proair 1-2 x per wk on average  Wants to be able to walk up inclines s sob  rec Add spiriva respimat x 2 puffs each am only right after the symbicort 160  Only use your albuterol as rescue    06/23/2015  f/u ov/Wert re: copd GOLDIII with reversibility /  symb 160 2bid and spiriva daily   Chief Complaint  Patient presents with  . Follow-up    Breathing is unchanged. He uses rescue inhaler 1 x pr wk on average.   using albuterol for noisy breathing at hs / trying e cigs but still smoking reg cigs also Doe = MMRC1 = can walk nl pace, flat grade, can't hurry or go uphills or steps s sob    rec Bevespi Take 2 puffs first thing in am and then another 2 puffs about 12 hours later and stop symbicort and spiriva for now  Only use your albuterol as a rescue medication The key is to stop smoking completely before smoking completely stops you- think of the e cigs as a one way bridge off all tobacco products   09/22/2015  f/u ov/Wert re: GOLD III copd/ maint rx =  symbicort/ spiriva resp Chief Complaint  Patient presents with  . Follow-up    Stopped smoking 1 month ago. He states breathing has been worse x 2 months- using albuterol 2 x daily on average. He has also noticed some wheezing at night.   now has neb uses at hs helps more than hfa but confused as to how to use it with his other resp meds  Doe continues = MMRC1 = can walk nl pace, flat grade, can't hurry or go uphills or steps s sob   rec Prednisone 10 mg take  4 each am x 2 days,   2 each am x 2 days,  1 each am x 2 days and stop  Plan A = Automatic = Symbicort 160 2 pffs first thing followed by Anna Genre and another 2 pffs 12 hours later  Plan B = Backup Only use your albuterol  Plan C = Crisis - only use your albuterol nebulizer if you first try Plan B and it fails to help > ok to use the nebulizer up to every 4 hours but if start needing it regularly call  for immediate appointment We will be referring you to rehab at [facility]    12/30/2015  f/u ov/Wert re: GOLD III copd/ maint rx = symbicort/ spiriva resp rare saba/ never neb no longer smoking/ vaping some  Chief Complaint  Patient presents with  . Follow-up    Breathing has improved with attending rehab. He occ has cough that wakes him up at night-this started approx 3 wks ago. Cough is prod with clear sputum. He is using albuterol inhaler 2 x per wk on average.    doe = mmrc 1    No obvious day to day or daytime variabilty or assoc  purulent sputum production  or cp or chest tightness, subjective wheeze overt sinus or hb symptoms. No unusual exp hx or h/o childhood pna/ asthma or knowledge of premature birth.  Sleeping ok without nocturnal  or early am exacerbation  of respiratory  c/o's or need for noct saba. Also denies any obvious fluctuation of symptoms with weather or environmental changes or other aggravating or alleviating factors except as outlined above   Current Medications, Allergies, Complete Past Medical History, Past Surgical History, Family History, and Social History were reviewed in [employer] record.  ROS  The following are not active complaints unless bolded sore throat, dysphagia, dental problems, itching, sneezing,  nasal congestion or excess/ purulent secretions, ear ache,   fever, chills, sweats, unintended wt loss, pleuritic or exertional cp, hemoptysis,  orthopnea pnd or leg swelling, presyncope, palpitations, heartburn, abdominal pain, anorexia, nausea, vomiting, diarrhea  or change in bowel or urinary habits, change in stools or urine, dysuria,hematuria,  rash, arthralgias, visual complaints, headache, numbness weakness or ataxia or problems with walking or coordination,  change in mood/affect or memory.              Objective:   Physical Exam  01/18/2014       179 > 02/07/2015 190 > 03/21/2015   192 > 06/23/2015  196 >   12/30/2015 198      02/09/13 177 lb (80.287 kg)  12/27/12 173 lb 12.8 oz (78.835 kg)    Elderly amb wm nad/ rattling cough  Vital signs reviewed    HEENT mild turbinate edema.  Extremely poor lower dentition / upper edentulous/  Oropharynx no thrush or excess pnd or cobblestoning.  No JVD or cervical adenopathy. Mild accessory muscle  hypertrophy. Trachea midline, nl thryroid. Chest was hyperinflated by percussion with diminished breath sounds and moderate increased exp time with mid/late  exp bilateral exp rhonchi. Hoover sign positive at mid inspiration. Regular rate and rhythm without murmur gallop or rub or increase P2 or edema.  Abd: obese/no hsm, nl excursion. Ext warm without cyanosis or clubbing.            Assessment & Plan:

## 2016-01-01 ENCOUNTER — Encounter (HOSPITAL_COMMUNITY): Payer: Medicare Other

## 2016-01-06 ENCOUNTER — Encounter (HOSPITAL_COMMUNITY)
Admission: RE | Admit: 2016-01-06 | Discharge: 2016-01-06 | Disposition: A | Discharge status: Home / Self Care | Payer: Medicare Other | Source: Ambulatory Visit | Attending: Internal Medicine | Admitting: Internal Medicine

## 2016-01-06 DIAGNOSIS — J449 Chronic obstructive pulmonary disease, unspecified: Secondary | ICD-10-CM | POA: Diagnosis not present

## 2016-01-06 NOTE — Progress Notes (Signed)
Daily Session Note  Patient Details  Name: Joel Perez MRN: [id number] Date of Birth: January 25, 1946 Referring Provider:   Flowsheet Row PULMONARY REHAB COPD ORIENTATION from 11/04/2015 in [place]  Referring Provider  Dr. [name]      Encounter Date: 01/06/2016  Check In:     Session Check In - 01/06/16 1330      Check-In   Location [facility]   Staff Present Russella Dar, MS, EP, [facility], Exercise Physiologist;Gregory Luther Parody, BS, EP, Exercise Physiologist   Supervising physician immediately available to respond to emergencies See telemetry face sheet for immediately available MD   Medication changes reported     No   Fall or balance concerns reported    No   Warm-up and Cool-down Performed as group-led instruction   Resistance Training Performed Yes   VAD Patient? No     Pain Assessment   Currently in Pain? No/denies   Pain Score 0-No pain   Multiple Pain Sites No      Capillary Blood Glucose: No results found for this or any previous visit (from the past 24 hour(s)).   Goals Met:  Independence with exercise equipment Exercise tolerated well No report of cardiac concerns or symptoms Strength training completed today  Goals Unmet:  Not Applicable  Comments: Check out 230   Dr. Kate Sable is Medical Director for [facility] and Pulmonary Rehab.

## 2016-01-07 NOTE — Progress Notes (Signed)
Pulmonary Individual Treatment Plan  Patient Details  Name: Joel Perez MRN: [id number] Date of Birth: [birth year] Referring Provider:   Flowsheet Row PULMONARY REHAB COPD ORIENTATION from 11/04/2015 in [place]  Referring Provider  Dr. [name]      Initial Encounter Date:  Flowsheet Row PULMONARY REHAB COPD ORIENTATION from 11/04/2015 in [place]  Date  11/04/15  Referring Provider  Dr. [name]      Visit Diagnosis: COPD mixed type [facility])  Patient's Home Medications on Admission:   Current Outpatient Prescriptions:  .  albuterol (PROAIR HFA) 108 (90 BASE) MCG/ACT inhaler, INHALE 2 PUFFS INTO THE LUNGS EVERY 6 HOURS AS NEEDED FOR WHEEZING OR SHORTNESS OF BREATH, Disp: 1 each, Rfl: 11 .  albuterol (PROVENTIL) (2.5 MG/3ML) 0.083% nebulizer solution, Take 3 mLs (2.5 mg total) by nebulization every 6 (six) hours as needed for wheezing or shortness of breath., Disp: 75 mL, Rfl: 11 .  aspirin 81 MG tablet, Take 81 mg by mouth daily., Disp: , Rfl:  .  b complex vitamins tablet, Take 1 tablet by mouth daily., Disp: , Rfl:  .  budesonide-formoterol (SYMBICORT) 160-4.5 MCG/ACT inhaler, Inhale 2 puffs into the lungs 2 (two) times daily., Disp: 1 Inhaler, Rfl: 0 .  Cyclobenzaprine HCl (FLEXERIL PO), Take 1 tablet by mouth 3 (three) times daily as needed., Disp: , Rfl:  .  Multiple Vitamin (MULTIVITAMIN) capsule, Take 1 capsule by mouth daily., Disp: , Rfl:  .  oxyCODONE-acetaminophen (PERCOCET/ROXICET) 5-325 MG per tablet, Take 1 tablet by mouth every 6 (six) hours as needed., Disp: , Rfl:  .  predniSONE (DELTASONE) 10 MG tablet, Take  4 each am x 2 days,   2 each am x 2 days,  1 each am x 2 days and stop, Disp: 14 tablet, Rfl: 0 .  Sildenafil Citrate (VIAGRA PO), Take 1 tablet by mouth as needed., Disp: , Rfl:  .  sodium chloride (OCEAN) 0.65 % nasal spray, Place 1 spray into the nose as needed for congestion., Disp: , Rfl:  .  SPIRIVA RESPIMAT 2.5  MCG/ACT AERS, Take 2 puffs by mouth daily., Disp: , Rfl:  .  SYMBICORT 160-4.5 MCG/ACT inhaler, Inhale 2 puffs into the lungs 2 (two) times daily., Disp: , Rfl:  .  Tiotropium Bromide Monohydrate (SPIRIVA RESPIMAT) 2.5 MCG/ACT AERS, Inhale 2 puffs into the lungs daily., Disp: 1 Inhaler, Rfl: 0  Past Medical History: Past Medical History:  Diagnosis Date  . COPD (chronic obstructive pulmonary disease) ([place])   . Low back pain     Tobacco Use: History  Smoking Status  . Former Smoker  . Packs/day: 2.00  . Years: 50.00  . Types: Cigarettes  . Quit date: 08/23/2015  Smokeless Tobacco  . Never Used    Labs: Recent Review Flowsheet Data    There is no flowsheet data to display.      Capillary Blood Glucose: No results found for: GLUCAP   ADL UCSD:     Pulmonary Assessment Scores    Row Name 11/04/15 1713         ADL UCSD   ADL Phase Entry     SOB Score total 42     Rest 0     Walk 8     Stairs 4     Bath 0     Dress 1     Shop 2       CAT Score   CAT Score 15  mMRC Score   mMRC Score 3        Pulmonary Function Assessment:     Pulmonary Function Assessment - 11/04/15 1710      Pulmonary Function Tests   FVC% 76 %   FEV1% 62 %   FEV1/FVC Ratio 60     Post Bronchodilator Spirometry Results   FVC% 76 %   FEV1% 62 %   FEV1/FVC Ratio 60     Breath   Shortness of Breath Fear of Shortness of Breath;Yes      Exercise Target Goals:    Exercise Program Goal: Individual exercise prescription set with THRR, safety & activity barriers. Participant demonstrates ability to understand and report RPE using BORG scale, to self-measure pulse accurately, and to acknowledge the importance of the exercise prescription.  Exercise Prescription Goal: Starting with aerobic activity 30 plus minutes a day, 3 days per week for initial exercise prescription. Provide home exercise prescription and guidelines that participant acknowledges understanding prior to  discharge.  Activity Barriers & Risk Stratification:   6 Minute Walk:     6 Minute Walk    Row Name 11/04/15 1620         6 Minute Walk   Phase Initial     Distance 1050 feet     Walk Time 6 minutes     # of Rest Breaks 0     MPH 1.98     METS 2.52     RPE 12     Perceived Dyspnea  13     VO2 Peak 9.09     Symptoms No     Resting HR 90 bpm     Resting BP 146/78     Max Ex. HR 102 bpm     Max Ex. BP 162/80     2 Minute Post BP 154/78        Initial Exercise Prescription:     Initial Exercise Prescription - 11/04/15 1600      Date of Initial Exercise RX and Referring Provider   Date 11/04/15   Referring Provider Dr. [name]     Treadmill   MPH 1.3   Grade 0   Minutes 15   METs 1.9     NuStep   Level 2   Watts 14   Minutes 15   METs 1.9     Arm Ergometer   Level 2   Watts 16   Minutes 15   METs 2.2     Prescription Details   Frequency (times per week) 2   Duration Progress to 30 minutes of continuous aerobic without signs/symptoms of physical distress     Intensity   THRR REST +  20   THRR 40-80% of Max Heartrate [phone]   Ratings of Perceived Exertion 11-13   Perceived Dyspnea 0-4     Progression   Progression Continue to progress workloads to maintain intensity without signs/symptoms of physical distress.     Resistance Training   Training Prescription Yes   Weight 1   Reps 10-12      Perform Capillary Blood Glucose checks as needed.  Exercise Prescription Changes:      Exercise Prescription Changes    Row Name 11/17/15 1800 11/27/15 0700 12/08/15 1400 12/30/15 1200       Exercise Review   Progression Yes Yes Yes Yes      Response to Exercise   Blood Pressure (Admit) 148/70 140/76 132/70 150/80    Blood Pressure (Exercise) 158/78 166/80 154/76 164/82  Blood Pressure (Exit) 142/76 132/78 132/76 130/78    Heart Rate (Admit) 84 bpm 8488 bpm 92 bpm 87 bpm    Heart Rate (Exercise) 88 bpm 90 bpm 97 bpm 101 bpm    Heart Rate  (Exit) 82 bpm 89 bpm 80 bpm 91 bpm    Oxygen Saturation (Admit)  -  - 91 % 92 %    Oxygen Saturation (Exercise)  -  - 93 % 94 %    Oxygen Saturation (Exit)  -  - 96 % 95 %    Rating of Perceived Exertion (Exercise) 11 11 11 12     Perceived Dyspnea (Exercise) 9 11 11 12     Comments  - Patient is being moved from arm ergometer to treadmill.  -  -    Duration Progress to 30 minutes of continuous aerobic without signs/symptoms of physical distress Progress to 30 minutes of continuous aerobic without signs/symptoms of physical distress Progress to 30 minutes of continuous aerobic without signs/symptoms of physical distress Progress to 30 minutes of continuous aerobic without signs/symptoms of physical distress    Intensity Rest + 20 Rest + 20 Rest + 20 Rest + 20      Progression   Progression Continue to progress workloads to maintain intensity without signs/symptoms of physical distress. Continue to progress workloads to maintain intensity without signs/symptoms of physical distress. Continue to progress workloads to maintain intensity without signs/symptoms of physical distress. Continue to progress workloads to maintain intensity without signs/symptoms of physical distress.      Resistance Training   Training Prescription No No No Yes    Weight 1 1 5 8     Reps 10-12 10-12 10-12 10-12      Treadmill   MPH 0 0 1.4 1.8    Grade 0 0 0 0    Minutes 0 0 15 15    METs 0 0 2 2.3      NuStep   Level 2 2 3 3     Watts 12 12 9 13     Minutes 15 15 20 20     METs 3.61 3.61 3.61 3.6      Arm Ergometer   Level 2.2 2.2 0 0    Watts 15 14 0 0    Minutes 20 20 0 0    METs 2 2.4 0 0      Home Exercise Plan   Plans to continue exercise at [place]    Frequency Add 2 additional days to program exercise sessions. Add 2 additional days to program exercise sessions. Add 2 additional days to program exercise sessions. Add 2 additional days to program exercise sessions.       Exercise  Comments:      Exercise Comments    Row Name 12/17/15 0917 12/30/15 1254         Exercise Comments Patient is proggressing appropriately  Patient is progressing appropriately         Discharge Exercise Prescription (Final Exercise Prescription Changes):     Exercise Prescription Changes - 12/30/15 1200      Exercise Review   Progression Yes     Response to Exercise   Blood Pressure (Admit) 150/80   Blood Pressure (Exercise) 164/82   Blood Pressure (Exit) 130/78   Heart Rate (Admit) 87 bpm   Heart Rate (Exercise) 101 bpm   Heart Rate (Exit) 91 bpm   Oxygen Saturation (Admit) 92 %   Oxygen Saturation (Exercise) 94 %   Oxygen Saturation (  Exit) 95 %   Rating of Perceived Exertion (Exercise) 12   Perceived Dyspnea (Exercise) 12   Duration Progress to 30 minutes of continuous aerobic without signs/symptoms of physical distress   Intensity Rest + 20     Progression   Progression Continue to progress workloads to maintain intensity without signs/symptoms of physical distress.     Resistance Training   Training Prescription Yes   Weight 8   Reps 10-12     Treadmill   MPH 1.8   Grade 0   Minutes 15   METs 2.3     NuStep   Level 3   Watts 13   Minutes 20   METs 3.6     Arm Ergometer   Level 0   Watts 0   Minutes 0   METs 0     Home Exercise Plan   Plans to continue exercise at Home   Frequency Add 2 additional days to program exercise sessions.      Nutrition:  Target Goals: Understanding of nutrition guidelines, daily intake of sodium 1500mg , cholesterol 200mg , calories 30% from fat and 7% or less from saturated fats, daily to have 5 or more servings of fruits and vegetables.  Biometrics:     Pre Biometrics - 11/04/15 1610      Pre Biometrics   Height 5\' 10"  (1.778 m)   Weight 198 lb 6.6 oz (90 kg)   Waist Circumference 42 inches   Hip Circumference 41.5 inches   Waist to Hip Ratio 1.01 %   BMI (Calculated) 28.5   Triceps Skinfold 21 mm   %  Body Fat 30.1 %   Grip Strength 80 kg   Flexibility 0 in   Single Leg Stand 12 seconds       Nutrition Therapy Plan and Nutrition Goals:   Nutrition Discharge: Rate Your Plate Scores:     Nutrition Assessments - 11/04/15 1718      Rate Your Plate Scores   Pre Score 53      Psychosocial: Target Goals: Acknowledge presence or absence of depression, maximize coping skills, provide positive support system. Participant is able to verbalize types and ability to use techniques and skills needed for reducing stress and depression.  Initial Review & Psychosocial Screening:     Initial Psych Review & Screening - 11/04/15 1730      Initial Review   Current issues with --  N/A     Family Dynamics   Good Support System? Yes     Barriers   Psychosocial barriers to participate in program There are no identifiable barriers or psychosocial needs.     Screening Interventions   Interventions Encouraged to exercise      Quality of Life Scores:     Quality of Life - 11/04/15 1731      Quality of Life Scores   Health/Function Pre 21 %   Socioeconomic Pre 21 %   Psych/Spiritual Pre 21 %   Family Pre 21 %   GLOBAL Pre 21 %      PHQ-9: Recent Review Flowsheet Data    Depression screen [facility] 2/9 11/04/2015   Decreased Interest 1   Down, Depressed, Hopeless 0   PHQ - 2 Score 1   Altered sleeping 0   Tired, decreased energy 2   Change in appetite 0   Feeling bad or failure about yourself  0   Trouble concentrating 0   Moving slowly or fidgety/restless 0   Suicidal thoughts 0  PHQ-9 Score 3   Difficult doing work/chores Somewhat difficult      Psychosocial Evaluation and Intervention:     Psychosocial Evaluation - 11/04/15 1730      Psychosocial Evaluation & Interventions   Interventions --  No referral needed. Patient is not depressed.    Continued Psychosocial Services Needed No      Psychosocial Re-Evaluation:     Psychosocial Re-Evaluation    [place] Name  11/17/15 1904 12/18/15 0827 01/07/16 1454         Psychosocial Re-Evaluation   Interventions Encouraged to attend Cardiac Rehabilitation for the exercise  -  -     Comments  - Patient has no psychosocial issues and does not need counseling.  Patient's QOL score was 21.00. He does not have psychosocial issues and does not need counseling.      Continued Psychosocial Services Needed No No No        Education: Education Goals: Education classes will be provided on a weekly basis, covering required topics. Participant will state understanding/return demonstration of topics presented.  Learning Barriers/Preferences:     Learning Barriers/Preferences - 11/04/15 1618      Learning Barriers/Preferences   Learning Barriers None   Learning Preferences Video;Individual Instruction      Education Topics: How Lungs Work and Diseases: - Discuss the anatomy of the lungs and diseases that can affect the lungs, such as COPD. Flowsheet Row PULMONARY REHAB OTHER RESPIRATORY from 12/25/2015 in [place]  Date  12/25/15  Educator  DC  Instruction Review Code  2- meets goals/outcomes      Exercise: -Discuss the importance of exercise, FITT principles of exercise, normal and abnormal responses to exercise, and how to exercise safely.   Environmental Irritants: -Discuss types of environmental irritants and how to limit exposure to environmental irritants.   Meds/Inhalers and oxygen: - Discuss respiratory medications, definition of an inhaler and oxygen, and the proper way to use an inhaler and oxygen.   Energy Saving Techniques: - Discuss methods to conserve energy and decrease shortness of breath when performing activities of daily living.    Bronchial Hygiene / Breathing Techniques: - Discuss breathing mechanics, pursed-lip breathing technique,  proper posture, effective ways to clear airways, and other functional breathing techniques   Cleaning Equipment: -  Provides group verbal and written instruction about the health risks of elevated stress, cause of high stress, and healthy ways to reduce stress.   Nutrition I: Fats: - Discuss the types of cholesterol, what cholesterol does to the body, and how cholesterol levels can be controlled. Flowsheet Row PULMONARY REHAB OTHER RESPIRATORY from 12/25/2015 in [place]  Date  11/13/15  Educator  Russella Dar  Instruction Review Code  2- meets goals/outcomes      Nutrition II: Labels: -Discuss the different components of food labels and how to read food labels. Flowsheet Row PULMONARY REHAB OTHER RESPIRATORY from 12/25/2015 in [place]  Date  11/20/15  Educator  Russella Dar  Instruction Review Code  2- meets goals/outcomes      Respiratory Infections: - Discuss the signs and symptoms of respiratory infections, ways to prevent respiratory infections, and the importance of seeking medical treatment when having a respiratory infection. Flowsheet Row PULMONARY REHAB OTHER RESPIRATORY from 12/25/2015 in [place]  Date  11/28/15  Educator  DC  Instruction Review Code  2- meets goals/outcomes      Stress I: Signs and Symptoms: - Discuss the causes of  stress, how stress may lead to anxiety and depression, and ways to limit stress. Flowsheet Row PULMONARY REHAB OTHER RESPIRATORY from 12/25/2015 in [place]  Date  12/04/15  Educator  DC  Instruction Review Code  2- meets goals/outcomes      Stress II: Relaxation: -Discuss relaxation techniques to limit stress.   Oxygen for Home/Travel: - Discuss how to prepare for travel when on oxygen and proper ways to transport and store oxygen to ensure safety. Flowsheet Row PULMONARY REHAB OTHER RESPIRATORY from 12/25/2015 in [place]  Date  12/18/15  Educator  DJ  Instruction Review Code  2- meets goals/outcomes      Knowledge  Questionnaire Score:     Knowledge Questionnaire Score - 11/04/15 1709      Knowledge Questionnaire Score   Pre Score 9/14      Core Components/Risk Factors/Patient Goals at Admission:     Personal Goals and Risk Factors at Admission - 11/04/15 1719      Core Components/Risk Factors/Patient Goals on Admission    Weight Management Yes   Intervention Weight Management: Provide education and appropriate resources to help participant work on and attain dietary goals.   Admit Weight 198 lb (89.8 kg)   Goal Weight: Short Term 190 lb 8 oz (86.4 kg)   Goal Weight: Long Term 183 lb (83 kg)   Expected Outcomes Short Term: Continue to assess and modify interventions until short term weight is achieved;Long Term: Adherence to nutrition and physical activity/exercise program aimed toward attainment of established weight goal   Increase Strength and Stamina Yes   Intervention Provide advice, education, support and counseling about physical activity/exercise needs.;Develop an individualized exercise prescription for aerobic and resistive training based on initial evaluation findings, risk stratification, comorbidities and participant's personal goals.   Expected Outcomes Achievement of increased cardiorespiratory fitness and enhanced flexibility, muscular endurance and strength shown through measurements of functional capacity and personal statement of participant.   Improve shortness of breath with ADL's Yes   Intervention Provide education, individualized exercise plan and daily activity instruction to help decrease symptoms of SOB with activities of daily living.   Expected Outcomes Short Term: Achieves a reduction of symptoms when performing activities of daily living.   Develop more efficient breathing techniques such as purse lipped breathing and diaphragmatic breathing; and practicing self-pacing with activity Yes   Intervention Provide education, demonstration and support about specific  breathing techniuqes utilized for more efficient breathing. Include techniques such as pursed lipped breathing, diaphragmatic breathing and self-pacing activity.   Expected Outcomes Short Term: Participant will be able to demonstrate and use breathing techniques as needed throughout daily activities.   Increase knowledge of respiratory medications and ability to use respiratory devices properly  Yes   Intervention Provide education and demonstration as needed of appropriate use of medications, inhalers, and oxygen therapy.   Expected Outcomes Short Term: Achieves understanding of medications use. Understands that oxygen is a medication prescribed by physician. Demonstrates appropriate use of inhaler and oxygen therapy.   Personal Goal Other Yes   Personal Goal Lose 5-15lbs within 12 weeks of PR, breath better, better health overall   Intervention Exercise 2 days week in program and supplement 3 more day at home.    Expected Outcomes Better breathing, weight loss      Core Components/Risk Factors/Patient Goals Review:      Goals and Risk Factor Review    Row Name 11/17/15 1901 12/18/15 0824 01/07/16 1452  Core Components/Risk Factors/Patient Goals Review   Personal Goals Review Develop more efficient breathing techniques such as purse lipped breathing and diaphragmatic breathing and practicing self-pacing with activity.;Weight Management/Obesity Weight Management/Obesity;Improve shortness of breath with ADL's;Increase Strength and Stamina;Other  Overall better health. Weight Management/Obesity;Increase Strength and Stamina;Improve shortness of breath with ADL's     Review Patient has not lost any weight yet. He is begining to breath better.  Patient has lost 3 lbs since starting the program. He is able to breathe better with increased strenght and stamina.  Patient has gained 0.2 lbs after 14 sessions. Patient is meeting his goal to breathe better and increase his strenght and stamina.       Expected Outcomes Lose weight, breath better, overall better health Patient will continue to lose weight and increase his strenght and stamina with improved SOB.  Patient will continue to meet the above stated goals.         Core Components/Risk Factors/Patient Goals at Discharge (Final Review):      Goals and Risk Factor Review - 01/07/16 1452      Core Components/Risk Factors/Patient Goals Review   Personal Goals Review Weight Management/Obesity;Increase Strength and Stamina;Improve shortness of breath with ADL's   Review Patient has gained 0.2 lbs after 14 sessions. Patient is meeting his goal to breathe better and increase his strenght and stamina.    Expected Outcomes Patient will continue to meet the above stated goals.       ITP Comments:   Comments: Patient is expected to progress towards personal goals after completing 14 sessions. Recommend continued exercise, lifestyle modifications, education, and utilization of breathing exercises to increase stamina and strength and decrease SOB with exertion.

## 2016-01-08 ENCOUNTER — Encounter (HOSPITAL_COMMUNITY)
Admission: RE | Admit: 2016-01-08 | Discharge: 2016-01-08 | Disposition: A | Discharge status: Home / Self Care | Payer: Medicare Other | Source: Ambulatory Visit | Attending: Internal Medicine | Admitting: Internal Medicine

## 2016-01-08 DIAGNOSIS — J449 Chronic obstructive pulmonary disease, unspecified: Secondary | ICD-10-CM | POA: Diagnosis not present

## 2016-01-08 NOTE — Progress Notes (Signed)
Daily Session Note  Patient Details  Name: Joel Perez MRN: [id number] Date of Birth: Oct 16, 1945 Referring Provider:   Flowsheet Row PULMONARY REHAB COPD ORIENTATION from 11/04/2015 in [place]  Referring Provider  Dr. [name]      Encounter Date: 01/08/2016  Check In:     Session Check In - 01/08/16 1329      Check-In   Location [facility]   Staff Present Aundra Dubin, RN, BSN;Diane Coad, MS, EP, [facility], Exercise Physiologist;Gregory Luther Parody, BS, EP, Exercise Physiologist   Supervising physician immediately available to respond to emergencies See telemetry face sheet for immediately available MD   Medication changes reported     No   Fall or balance concerns reported    No   Warm-up and Cool-down Performed as group-led instruction   Resistance Training Performed Yes   VAD Patient? No     Pain Assessment   Currently in Pain? No/denies   Pain Score 0-No pain   Multiple Pain Sites No      Capillary Blood Glucose: No results found for this or any previous visit (from the past 24 hour(s)).   Goals Met:  Independence with exercise equipment Exercise tolerated well No report of cardiac concerns or symptoms Strength training completed today  Goals Unmet:  Not Applicable  Comments: Check out 230   Dr. Kate Sable is Medical Director for [place] and Pulmonary Rehab.

## 2016-01-13 ENCOUNTER — Encounter (HOSPITAL_COMMUNITY)
Admission: RE | Admit: 2016-01-13 | Discharge: 2016-01-13 | Disposition: A | Discharge status: Home / Self Care | Payer: Medicare Other | Source: Ambulatory Visit | Attending: Internal Medicine | Admitting: Internal Medicine

## 2016-01-13 DIAGNOSIS — J449 Chronic obstructive pulmonary disease, unspecified: Secondary | ICD-10-CM

## 2016-01-13 NOTE — Progress Notes (Signed)
Daily Session Note  Patient Details  Name: [name] MRN: [id number] Date of Birth: 09/09/1945 Referring Provider:   Flowsheet Row PULMONARY REHAB COPD ORIENTATION from 11/04/2015 in [place] [facility]  Referring Provider  Dr. Wert      Encounter Date: 01/13/2016  Check In:     Session Check In - 01/13/16 1330      Check-In   Location [facility]   Staff Present Diane Coad, MS, EP, [facility], Exercise Physiologist;Gregory Cowan, BS, EP, Exercise Physiologist   Supervising physician immediately available to respond to emergencies See telemetry face sheet for immediately available MD   Medication changes reported     No   Fall or balance concerns reported    No   Warm-up and Cool-down Performed as group-led instruction   Resistance Training Performed Yes   VAD Patient? No     Pain Assessment   Currently in Pain? No/denies   Pain Score 0-No pain   Multiple Pain Sites No      Capillary Blood Glucose: No results found for this or any previous visit (from the past 24 hour(s)).   Goals Met:  Independence with exercise equipment Exercise tolerated well No report of cardiac concerns or symptoms Strength training completed today  Goals Unmet:  Not Applicable  Comments: Check out 230   Dr. Suresh Koneswaran is Medical Director for [place] [facility] and [facility] Rehab. 

## 2016-01-15 ENCOUNTER — Encounter (HOSPITAL_COMMUNITY)
Admission: RE | Admit: 2016-01-15 | Discharge: 2016-01-15 | Disposition: A | Discharge status: Home / Self Care | Payer: Medicare Other | Source: Ambulatory Visit | Attending: Internal Medicine | Admitting: Internal Medicine

## 2016-01-15 DIAGNOSIS — J449 Chronic obstructive pulmonary disease, unspecified: Secondary | ICD-10-CM | POA: Diagnosis not present

## 2016-01-15 NOTE — Progress Notes (Signed)
Daily Session Note  Patient Details  Name: GARRETT MITCHUM MRN: [id number] Date of Birth: 1945/09/08 Referring Provider:   Flowsheet Row PULMONARY REHAB COPD ORIENTATION from 11/04/2015 in [place]  Referring Provider  Dr. [name]      Encounter Date: 01/15/2016  Check In:     Session Check In - 01/15/16 1326      Check-In   Location [facility]   Staff Present Diane Angelina Pih, MS, EP, [facility], Exercise Physiologist;Debra Wynetta Emery, RN, BSN;Gregory Cowan, BS, EP, Exercise Physiologist   Supervising physician immediately available to respond to emergencies See telemetry face sheet for immediately available MD   Medication changes reported     No   Fall or balance concerns reported    No   Warm-up and Cool-down Performed as group-led instruction   Resistance Training Performed Yes   VAD Patient? No     Pain Assessment   Currently in Pain? No/denies   Pain Score 0-No pain   Multiple Pain Sites No      Capillary Blood Glucose: No results found for this or any previous visit (from the past 24 hour(s)).   Goals Met:  Independence with exercise equipment Exercise tolerated well No report of cardiac concerns or symptoms Strength training completed today  Goals Unmet:  Not Applicable  Comments: Check out 230   Dr. Kate Sable is Medical Director for [place] and Pulmonary Rehab.

## 2016-01-20 ENCOUNTER — Encounter (HOSPITAL_COMMUNITY)
Admission: RE | Admit: 2016-01-20 | Discharge: 2016-01-20 | Disposition: A | Discharge status: Home / Self Care | Payer: Medicare Other | Source: Ambulatory Visit | Attending: Internal Medicine | Admitting: Internal Medicine

## 2016-01-20 DIAGNOSIS — J449 Chronic obstructive pulmonary disease, unspecified: Secondary | ICD-10-CM | POA: Diagnosis not present

## 2016-01-20 NOTE — Progress Notes (Signed)
Daily Session Note  Patient Details  Name: Joel Perez MRN: [id number] Date of Birth: 12-18-1945 Referring Provider:   Flowsheet Row PULMONARY REHAB COPD ORIENTATION from 11/04/2015 in [place]  Referring Provider  Dr. [name]      Encounter Date: 01/20/2016  Check In:     Session Check In - 01/20/16 1325      Check-In   Location [facility]   Staff Present Russella Dar, MS, EP, [facility], Exercise Physiologist;Gregory Luther Parody, BS, EP, Exercise Physiologist   Supervising physician immediately available to respond to emergencies See telemetry face sheet for immediately available MD   Medication changes reported     No   Fall or balance concerns reported    No   Warm-up and Cool-down Performed as group-led instruction   Resistance Training Performed Yes   VAD Patient? No     Pain Assessment   Currently in Pain? No/denies   Pain Score 0-No pain   Multiple Pain Sites No      Capillary Blood Glucose: No results found for this or any previous visit (from the past 24 hour(s)).   Goals Met:  Independence with exercise equipment Exercise tolerated well No report of cardiac concerns or symptoms Strength training completed today  Goals Unmet:  Not Applicable  Comments: Check out 230   Dr. Kate Sable is Medical Director for [facility] and Pulmonary Rehab.

## 2016-01-22 ENCOUNTER — Encounter (HOSPITAL_COMMUNITY)
Admission: RE | Admit: 2016-01-22 | Discharge: 2016-01-22 | Disposition: A | Discharge status: Home / Self Care | Payer: Medicare Other | Source: Ambulatory Visit | Attending: Internal Medicine | Admitting: Internal Medicine

## 2016-01-22 ENCOUNTER — Telehealth: Payer: Self-pay | Admitting: Internal Medicine

## 2016-01-22 DIAGNOSIS — J449 Chronic obstructive pulmonary disease, unspecified: Secondary | ICD-10-CM | POA: Diagnosis not present

## 2016-01-22 MED ORDER — BUDESONIDE-FORMOTEROL FUMARATE 160-4.5 MCG/ACT IN AERO: 2.0000 | INHALATION_SPRAY | Freq: Two times a day (BID) | RESPIRATORY_TRACT | 11 refills | Status: DC

## 2016-01-22 NOTE — Progress Notes (Signed)
Daily Session Note  Patient Details  Name: Joel Perez MRN: [id number] Date of Birth: [birth year] Referring Provider:   Flowsheet Row PULMONARY REHAB COPD ORIENTATION from 11/04/2015 in [place]  Referring Provider  Dr. [name]      Encounter Date: 01/22/2016  Check In:     Session Check In - 01/22/16 1324      Check-In   Location [facility]   Staff Present Diane Angelina Pih, MS, EP, [facility], Exercise Physiologist;Debra Wynetta Emery, RN, BSN;Gregory Cowan, BS, EP, Exercise Physiologist   Supervising physician immediately available to respond to emergencies See telemetry face sheet for immediately available MD   Medication changes reported     No   Fall or balance concerns reported    No   Warm-up and Cool-down Performed as group-led instruction   Resistance Training Performed Yes   VAD Patient? No     Pain Assessment   Currently in Pain? No/denies   Pain Score 0-No pain   Multiple Pain Sites No      Capillary Blood Glucose: No results found for this or any previous visit (from the past 24 hour(s)).   Goals Met:  Independence with exercise equipment Exercise tolerated well No report of cardiac concerns or symptoms Strength training completed today  Goals Unmet:  Not Applicable  Comments: Check out 230   Dr. Kate Sable is Medical Director for [facility] and Pulmonary Rehab.

## 2016-01-22 NOTE — Telephone Encounter (Signed)
Per MW 12/30/15 note pt is to continue symbicort.  Rx has been sent to preferred pharmacy. Pt aware and voices understanding. Nothing further needed.

## 2016-01-27 ENCOUNTER — Encounter (HOSPITAL_COMMUNITY): Payer: Medicare Other

## 2016-01-28 NOTE — Progress Notes (Signed)
Discharge Summary  Patient Details  Name: Joel Perez MRN: [id number] Date of Birth: 10-23-1945 Referring Provider:   Flowsheet Row PULMONARY REHAB COPD ORIENTATION from 11/04/2015 in [place]  Referring Provider  Dr. [name]       Number of Visits: 19  Reason for Discharge:  Early Exit:  Patient stopped program after 19 sessions d/t financial reasons. He stated he could not afford the co-pay and his medications. He did feel stronger and felt like the program was beneficial.   Smoking History:  History  Smoking Status  . Former Smoker  . Packs/day: 2.00  . Years: 50.00  . Types: Cigarettes  . Quit date: 08/23/2015  Smokeless Tobacco  . Never Used    Diagnosis:  COPD mixed type ([place])  ADL UCSD:     Pulmonary Assessment Scores    Row Name 11/04/15 1713         ADL UCSD   ADL Phase Entry     SOB Score total 42     Rest 0     Walk 8     Stairs 4     Bath 0     Dress 1     Shop 2       CAT Score   CAT Score 15       mMRC Score   mMRC Score 3        Initial Exercise Prescription:     Initial Exercise Prescription - 11/04/15 1600      Date of Initial Exercise RX and Referring Provider   Date 11/04/15   Referring Provider Dr. [name]     Treadmill   MPH 1.3   Grade 0   Minutes 15   METs 1.9     NuStep   Level 2   Watts 14   Minutes 15   METs 1.9     Arm Ergometer   Level 2   Watts 16   Minutes 15   METs 2.2     Prescription Details   Frequency (times per week) 2   Duration Progress to 30 minutes of continuous aerobic without signs/symptoms of physical distress     Intensity   THRR REST +  20   THRR 40-80% of Max Heartrate [phone]   Ratings of Perceived Exertion 11-13   Perceived Dyspnea 0-4     Progression   Progression Continue to progress workloads to maintain intensity without signs/symptoms of physical distress.     Resistance Training   Training Prescription Yes   Weight 1   Reps 10-12       Discharge Exercise Prescription (Final Exercise Prescription Changes):     Exercise Prescription Changes - 12/30/15 1200      Exercise Review   Progression Yes     Response to Exercise   Blood Pressure (Admit) 150/80   Blood Pressure (Exercise) 164/82   Blood Pressure (Exit) 130/78   Heart Rate (Admit) 87 bpm   Heart Rate (Exercise) 101 bpm   Heart Rate (Exit) 91 bpm   Oxygen Saturation (Admit) 92 %   Oxygen Saturation (Exercise) 94 %   Oxygen Saturation (Exit) 95 %   Rating of Perceived Exertion (Exercise) 12   Perceived Dyspnea (Exercise) 12   Duration Progress to 30 minutes of continuous aerobic without signs/symptoms of physical distress   Intensity Rest + 20     Progression   Progression Continue to progress workloads to maintain intensity without signs/symptoms of physical distress.  Resistance Training   Training Prescription Yes   Weight 8   Reps 10-12     Treadmill   MPH 1.8   Grade 0   Minutes 15   METs 2.3     NuStep   Level 3   Watts 13   Minutes 20   METs 3.6     Arm Ergometer   Level 0   Watts 0   Minutes 0   METs 0     Home Exercise Plan   Plans to continue exercise at Home   Frequency Add 2 additional days to program exercise sessions.      Functional Capacity:     6 Minute Walk    Row Name 11/04/15 1620         6 Minute Walk   Phase Initial     Distance 1050 feet     Walk Time 6 minutes     # of Rest Breaks 0     MPH 1.98     METS 2.52     RPE 12     Perceived Dyspnea  13     VO2 Peak 9.09     Symptoms No     Resting HR 90 bpm     Resting BP 146/78     Max Ex. HR 102 bpm     Max Ex. BP 162/80     2 Minute Post BP 154/78        Psychological, QOL, Others - Outcomes: PHQ 2/9: Depression screen PHQ 2/9 11/04/2015  Decreased Interest 1  Down, Depressed, Hopeless 0  PHQ - 2 Score 1  Altered sleeping 0  Tired, decreased energy 2  Change in appetite 0  Feeling bad or failure about yourself  0  Trouble  concentrating 0  Moving slowly or fidgety/restless 0  Suicidal thoughts 0  PHQ-9 Score 3  Difficult doing work/chores Somewhat difficult    Quality of Life:     Quality of Life - 11/04/15 1731      Quality of Life Scores   Health/Function Pre 21 %   Socioeconomic Pre 21 %   Psych/Spiritual Pre 21 %   Family Pre 21 %   GLOBAL Pre 21 %      Personal Goals: Goals established at orientation with interventions provided to work toward goal.     Personal Goals and Risk Factors at Admission - 11/04/15 1719      Core Components/Risk Factors/Patient Goals on Admission    Weight Management Yes   Intervention Weight Management: Provide education and appropriate resources to help participant work on and attain dietary goals.   Admit Weight 198 lb (89.8 kg)   Goal Weight: Short Term 190 lb 8 oz (86.4 kg)   Goal Weight: Long Term 183 lb (83 kg)   Expected Outcomes Short Term: Continue to assess and modify interventions until short term weight is achieved;Long Term: Adherence to nutrition and physical activity/exercise program aimed toward attainment of established weight goal   Increase Strength and Stamina Yes   Intervention Provide advice, education, support and counseling about physical activity/exercise needs.;Develop an individualized exercise prescription for aerobic and resistive training based on initial evaluation findings, risk stratification, comorbidities and participant's personal goals.   Expected Outcomes Achievement of increased cardiorespiratory fitness and enhanced flexibility, muscular endurance and strength shown through measurements of functional capacity and personal statement of participant.   Improve shortness of breath with ADL's Yes   Intervention Provide education, individualized exercise plan and daily  activity instruction to help decrease symptoms of SOB with activities of daily living.   Expected Outcomes Short Term: Achieves a reduction of symptoms when  performing activities of daily living.   Develop more efficient breathing techniques such as purse lipped breathing and diaphragmatic breathing; and practicing self-pacing with activity Yes   Intervention Provide education, demonstration and support about specific breathing techniuqes utilized for more efficient breathing. Include techniques such as pursed lipped breathing, diaphragmatic breathing and self-pacing activity.   Expected Outcomes Short Term: Participant will be able to demonstrate and use breathing techniques as needed throughout daily activities.   Increase knowledge of respiratory medications and ability to use respiratory devices properly  Yes   Intervention Provide education and demonstration as needed of appropriate use of medications, inhalers, and oxygen therapy.   Expected Outcomes Short Term: Achieves understanding of medications use. Understands that oxygen is a medication prescribed by physician. Demonstrates appropriate use of inhaler and oxygen therapy.   Personal Goal Other Yes   Personal Goal Lose 5-15lbs within 12 weeks of PR, breath better, better health overall   Intervention Exercise 2 days week in program and supplement 3 more day at home.    Expected Outcomes Better breathing, weight loss       Personal Goals Discharge:     Goals and Risk Factor Review    Row Name 11/17/15 1901 12/18/15 0824 01/07/16 1452         Core Components/Risk Factors/Patient Goals Review   Personal Goals Review Develop more efficient breathing techniques such as purse lipped breathing and diaphragmatic breathing and practicing self-pacing with activity.;Weight Management/Obesity Weight Management/Obesity;Improve shortness of breath with ADL's;Increase Strength and Stamina;Other  Overall better health. Weight Management/Obesity;Increase Strength and Stamina;Improve shortness of breath with ADL's     Review Patient has not lost any weight yet. He is begining to breath better.  Patient  has lost 3 lbs since starting the program. He is able to breathe better with increased strenght and stamina.  Patient has gained 0.2 lbs after 14 sessions. Patient is meeting his goal to breathe better and increase his strenght and stamina.      Expected Outcomes Lose weight, breath better, overall better health Patient will continue to lose weight and increase his strenght and stamina with improved SOB.  Patient will continue to meet the above stated goals.         Nutrition & Weight - Outcomes:     Pre Biometrics - 11/04/15 1610      Pre Biometrics   Height 5\' 10"  (1.778 m)   Weight 198 lb 6.6 oz (90 kg)   Waist Circumference 42 inches   Hip Circumference 41.5 inches   Waist to Hip Ratio 1.01 %   BMI (Calculated) 28.5   Triceps Skinfold 21 mm   % Body Fat 30.1 %   Grip Strength 80 kg   Flexibility 0 in   Single Leg Stand 12 seconds       Nutrition:   Nutrition Discharge:     Nutrition Assessments - 11/04/15 1718      Rate Your Plate Scores   Pre Score 53      Education Questionnaire Score:     Knowledge Questionnaire Score - 11/04/15 1709      Knowledge Questionnaire Score   Pre Score 9/14

## 2016-01-29 ENCOUNTER — Encounter (HOSPITAL_COMMUNITY): Payer: Medicare Other

## 2016-02-03 ENCOUNTER — Encounter (HOSPITAL_COMMUNITY): Payer: Medicare Other

## 2016-02-05 ENCOUNTER — Encounter (HOSPITAL_COMMUNITY): Payer: Medicare Other

## 2016-02-10 ENCOUNTER — Encounter (HOSPITAL_COMMUNITY): Payer: Medicare Other

## 2016-02-12 ENCOUNTER — Encounter (HOSPITAL_COMMUNITY): Payer: Medicare Other

## 2016-02-17 ENCOUNTER — Encounter (HOSPITAL_COMMUNITY): Payer: Medicare Other

## 2016-02-19 ENCOUNTER — Encounter (HOSPITAL_COMMUNITY): Payer: Medicare Other

## 2016-02-24 ENCOUNTER — Encounter (HOSPITAL_COMMUNITY): Payer: Medicare Other

## 2016-02-26 ENCOUNTER — Encounter (HOSPITAL_COMMUNITY): Payer: Medicare Other

## 2016-03-02 ENCOUNTER — Encounter (HOSPITAL_COMMUNITY): Payer: Medicare Other

## 2016-03-04 ENCOUNTER — Encounter (HOSPITAL_COMMUNITY): Payer: Medicare Other

## 2016-03-09 ENCOUNTER — Encounter (HOSPITAL_COMMUNITY): Payer: Medicare Other

## 2016-03-11 ENCOUNTER — Encounter (HOSPITAL_COMMUNITY): Payer: Medicare Other

## 2016-03-31 ENCOUNTER — Encounter: Payer: Self-pay | Admitting: Internal Medicine

## 2016-03-31 ENCOUNTER — Ambulatory Visit (INDEPENDENT_AMBULATORY_CARE_PROVIDER_SITE_OTHER): Payer: Medicare Other | Admitting: Internal Medicine

## 2016-03-31 ENCOUNTER — Ambulatory Visit (INDEPENDENT_AMBULATORY_CARE_PROVIDER_SITE_OTHER)
Admission: RE | Admit: 2016-03-31 | Discharge: 2016-03-31 | Disposition: A | Discharge status: Home / Self Care | Payer: Medicare Other | Source: Ambulatory Visit | Attending: Internal Medicine | Admitting: Internal Medicine

## 2016-03-31 VITALS — BP 140/84 | HR 100 | Ht 70.0 in | Wt 197.8 lb

## 2016-03-31 DIAGNOSIS — J449 Chronic obstructive pulmonary disease, unspecified: Secondary | ICD-10-CM

## 2016-03-31 MED ORDER — BUDESONIDE-FORMOTEROL FUMARATE 160-4.5 MCG/ACT IN AERO: 2.0000 | INHALATION_SPRAY | Freq: Two times a day (BID) | RESPIRATORY_TRACT | 0 refills | Status: DC

## 2016-03-31 MED ORDER — PREDNISONE 10 MG PO TABS: ORAL_TABLET | ORAL | 11 refills | Status: DC

## 2016-03-31 NOTE — Progress Notes (Signed)
Subjective:    Patient ID: Joel Perez, male    DOB: 10-18-1945   MRN: [id number]    Brief patient profile:   40 yowm active smoker referred by Dr Moreen Fowler for eval of copd with GOLD II criteria established 02/09/13    History of Present Illness  12/27/2012 1st pulmonary eval/ Wert cc onset x 2009 am cough cough/ congestion and progresive doe with white mucus x sev tbsp x sev min better p proaire and maintained on spiriva which he feels doesn't work as well as advair. Doe x one flight steps, dragging the garbage to street stops x 2. He denies cough to me but actually has a rattling with cough maneuver but doesn't bring up much mucus, what he does bring up is mostly in ams and mucoid and some better since quit smoking  rec symbicort 160 Take 2 puffs first thing in am and then another 2 puffs about 12 hours later.  Only use your albuterol (proaire) as a rescue medication to be used if you can't catch your breath by resting or doing a relaxed purse lip breathing pattern. The less you use it, the better it will work when you need it.  As you improve ok to stop spiriva  Please schedule a follow up office visit in 6 weeks, call sooner if needed with pft's   02/09/2013 f/u ov/Wert re: GOLD II with reversibilty Chief Complaint  Patient presents with  . Follow-up    W/ PFT. Pt reports his breathing is better. He gets up tan color phlem up right after breathing tx  still rattling some but much better.  rec Continue symbicort 160 Take 2 puffs first thing in am and then another 2 puffs about 12 hours later.  Only use your proair as a rescue medication to be used if you can't catch your breath by resting or doing a relaxed purse lip breathing pattern. The less you use it, the better it will work when you need it.  Don't leave home without it [name] to leave off spiriva     03/21/2015  f/u ov/Wert re: COPD III with reversibilty on symb 160 2bid / still smoking Chief Complaint  Patient presents with  .  Follow-up    PFT done today. Breathing has improved since the last visit. He is usin proair 1-2 x per wk on average  Wants to be able to walk up inclines s sob  rec Add spiriva respimat x 2 puffs each am only right after the symbicort 160  Only use your albuterol as rescue    06/23/2015  f/u ov/Wert re: copd GOLDIII with reversibility /  symb 160 2bid and spiriva daily   Chief Complaint  Patient presents with  . Follow-up    Breathing is unchanged. He uses rescue inhaler 1 x pr wk on average.   using albuterol for noisy breathing at hs / trying e cigs but still smoking reg cigs also Doe = MMRC1 = can walk nl pace, flat grade, can't hurry or go uphills or steps s sob    rec Bevespi Take 2 puffs first thing in am and then another 2 puffs about 12 hours later and stop symbicort and spiriva for now  Only use your albuterol as a rescue medication The key is to stop smoking completely before smoking completely stops you- think of the e cigs as a one way bridge off all tobacco products   09/22/2015  f/u ov/Wert re: GOLD III copd/ maint rx =  symbicort/ spiriva resp Chief Complaint  Patient presents with  . Follow-up    Stopped smoking 1 month ago. He states breathing has been worse x 2 months- using albuterol 2 x daily on average. He has also noticed some wheezing at night.   now has neb uses at hs helps more than hfa but confused as to how to use it with his other resp meds  Doe continues = MMRC1 = can walk nl pace, flat grade, can't hurry or go uphills or steps s sob   rec Prednisone 10 mg take  4 each am x 2 days,   2 each am x 2 days,  1 each am x 2 days and stop  Plan A = Automatic = Symbicort 160 2 pffs first thing followed by Anna Genre and another 2 pffs 12 hours later  Plan B = Backup Only use your albuterol  Plan C = Crisis - only use your albuterol nebulizer if you first try Plan B and it fails to help > ok to use the nebulizer up to every 4 hours but if start needing it regularly call  for immediate appointment We will be referring you to rehab at [facility]      03/31/2016  f/u ov/Wert re:  GOLD III/ still not smoking cigs/ some ecigs / today took symbicort 2 and spivirva (one puff to make it last)  Chief Complaint  Patient presents with  . Follow-up    Breathing is doing well. He states had to stop attending rehab due to insurance issues. He states he uses rescue inhaler nightly due to wheezing.    at end of rehab able to do 2 mph flat x 15 min but has not continued at home though he does have similar equipment  Using proair 1 hour after symbicort in pm " to break up my night congestion/wheezing" Not taking either symb or spiriva as rec at previous ov ? Due to cost ? Due to not processing instructions?   No obvious day to day or daytime variabilty or assoc  purulent sputum production  or cp or chest tightness, subjective wheeze overt sinus or hb symptoms. No unusual exp hx or h/o childhood pna/ asthma or knowledge of premature birth.  Sleeping ok without nocturnal  or early am exacerbation  of respiratory  c/o's or need for noct saba. Also denies any obvious fluctuation of symptoms with weather or environmental changes or other aggravating or alleviating factors except as outlined above   Current Medications, Allergies, Complete Past Medical History, Past Surgical History, Family History, and Social History were reviewed in [employer] record.  ROS  The following are not active complaints unless bolded sore throat, dysphagia, dental problems, itching, sneezing,  nasal congestion or excess/ purulent secretions, ear ache,   fever, chills, sweats, unintended wt loss, pleuritic or exertional cp, hemoptysis,  orthopnea pnd or leg swelling, presyncope, palpitations, heartburn, abdominal pain, anorexia, nausea, vomiting, diarrhea  or change in bowel or urinary habits, change in stools or urine, dysuria,hematuria,  rash, arthralgias, visual complaints,  headache, numbness weakness or ataxia or problems with walking or coordination,  change in mood/affect or memory.              Objective:   Physical Exam  01/18/2014       179 > 02/07/2015 190 > 03/21/2015   192 > 06/23/2015  196 >   12/30/2015 198 > 03/31/2016 198     02/09/13 177 lb (80.287 kg)  12/27/12 173 lb 12.8 oz (78.835 kg)    Elderly amb wm nad/ rattling cough   Vital signs reviewed  - Note on arrival 02 sats  96% on RA   HEENT mild turbinate edema.  Extremely poor lower dentition / upper edentulous/  Oropharynx no thrush or excess pnd or cobblestoning.  No JVD or cervical adenopathy. Mild accessory muscle hypertrophy. Trachea midline, nl thryroid. Chest was hyperinflated by percussion with diminished breath sounds and moderate increased exp time with mid/late  exp bilateral exp rhonchi. Hoover sign positive at mid inspiration. Regular rate and rhythm without murmur gallop or rub or increase P2 or edema.  Abd: obese/no hsm, nl excursion. Ext warm without cyanosis or clubbing.       CXR PA and Lateral:   03/31/2016 :    I personally reviewed images and agree with radiology impression as follows:   Mild hyper expansion with right base scarring. No edema or consolidation. Stable cardiac silhouette. Aortic atherosclerosis noted.      Assessment & Plan:

## 2016-03-31 NOTE — Patient Instructions (Addendum)
Plan A = Automatic = Symbicort 160 2 pffs first thing in AM  followed by Spriva and another 2 pffs 12 hours later of symbicort only  Plan B = Backup Only use your albuterol as a rescue medication to be used if you can't catch your breath by resting or doing a relaxed purse lip breathing pattern.  - The less you use it, the better it will work when you need it. - Ok to use the inhaler up to 2 puffs  every 4 hours if you must but call for appointment if use goes up over your usual need - Don't leave home without it !!  (think of it like the spare tire for your car)    Plan C = Crisis - only use your albuterol nebulizer if you first try Plan B and it fails to help > ok to use the nebulizer up to every 4 hours but if start needing it regularly call for immediate appointment    If breathing / wheezing coughing getting worse > Prednisone 10 mg take  4 each am x 2 days,   2 each am x 2 days,  1 each am x 2 days and stop   Try to walk daily if possible to let me know if you are losing any ground   Please remember to go to the   x-ray department downstairs for your tests - we will call you with the results when they are available.       Please schedule a follow up visit in 3 months but call sooner if needed

## 2016-04-01 ENCOUNTER — Encounter: Payer: Self-pay | Admitting: Internal Medicine

## 2016-04-01 NOTE — Assessment & Plan Note (Addendum)
-   02/09/2013  FEV1 2.14 (68%) ratio 60 p 30% better from B2 and dloc 52 corrects to 69 - 02/07/2015  extensive coaching HFA effectiveness =    90% from a baseline of 75%  - 02/07/2015  Walked RA x 3 laps @ 185 ft each stopped due to  End of study, nl pace, no sob or desat   - PFT's  03/21/2015  FEV1 1.92 (62 % ) ratio 60  p 14 % improvement from saba with DLCO  47 % corrects to 62 % for alv volume   - trial of spiriva respimat 03/21/2015  With goal to be able to walk up inclines > no better 06/23/2015 > try bevespi 2 bid x 3 weeks  - sob   Worse p 3 weeks bevespi  so resumed symb/spirva 06/25/2015  - quite smoking 07/2015> e cigs - referred to rehab 09/22/2015 >>> d/c'd p 19 sessions on 01/22/16 due to costs - 12/30/2015  After extensive coaching HFA effectiveness =    90%  - 03/31/2016 rec pred x 6 days if needed    Based on multiple challenges (cost of meds/ understanding how to use them/ poor motivation to continue what he learned at rehab / ? Depression) pt not doing as well as he shouldbe given an FEV1 of almost 2 liters p saba at last pfts.   I had an extended discussion with the patient reviewing all relevant studies completed to date and  lasting 15 to 20 minutes of a 25 minute visit on the following ongoing concerns:    1) timing / function of inhalers reviewed using ABC format  2) Formulary restrictions will be an ongoing challenge for the forseable future and I would be happy to pick an alternative if the pt will first  provide me a list of them but pt  will need to return here for training for any new device that is required eg dpi vs hfa vs respimat.    In meantime we can always provide samples so the patient never runs out of any needed respiratory medications as we did today  3) congratulated on maintaining off cigs and now needs to wean off e cigs too if possible  4) advised if needed due to above challenges with cost of meds he can fall back on contingency of pred x 6 days since  responds so well and so inexpensive, at least as a short term solution.  5) Each maintenance medication was reviewed in detail including most importantly the difference between maintenance and as needed and under what circumstances the prns are to be used.  Please see instructions for details which were reviewed in writing and the patient given a copy.

## 2016-04-01 NOTE — Progress Notes (Signed)
Spoke with pt and notified of results per Dr. Wert. Pt verbalized understanding and denied any questions. 

## 2016-07-02 ENCOUNTER — Encounter: Payer: Self-pay | Admitting: Internal Medicine

## 2016-07-02 ENCOUNTER — Ambulatory Visit (INDEPENDENT_AMBULATORY_CARE_PROVIDER_SITE_OTHER): Payer: Medicare Other | Admitting: Internal Medicine

## 2016-07-02 DIAGNOSIS — J449 Chronic obstructive pulmonary disease, unspecified: Secondary | ICD-10-CM | POA: Diagnosis not present

## 2016-07-02 NOTE — Assessment & Plan Note (Addendum)
-   02/09/2013  FEV1 2.14 (68%) ratio 60 p 30% better from B2 and dloc 52 corrects to 69 - 02/07/2015  extensive coaching HFA effectiveness =    90% from a baseline of 75%  - 02/07/2015  Walked RA x 3 laps @ 185 ft each stopped due to  End of study, nl pace, no sob or desat   - PFT's  03/21/2015  FEV1 1.92 (62 % ) ratio 60  p 14 % improvement from saba with DLCO  47 % corrects to 62 % for alv volume   - trial of spiriva respimat 03/21/2015  With goal to be able to walk up inclines > no better 06/23/2015 > try bevespi 2 bid x 3 weeks  -sob   Worse p 3 weeks bevespi  so resumed symb/spirva 06/25/2015  - quite smoking 07/2015> e cigs - referred to rehab 09/22/2015 >>> d/c'd p 19 sessions on 01/22/16 due to costs - 12/30/2015  After extensive coaching HFA effectiveness =    90%  - 03/31/2016 rec pred x 6 days if needed and lama > no change 07/02/2016 s lama so left it off for now   Well compensated copd on just laba/ics   Each maintenance medication was reviewed in detail including most importantly the difference between maintenance and as needed and under what circumstances the prns are to be used.  Please see AVS for specific  Instructions which are unique to this visit and I personally typed out  which were reviewed in detail in writing with the patient and a copy provided.

## 2016-07-02 NOTE — Patient Instructions (Signed)
Plan A = Automatic = symbicort 160 Take 2 puffs first thing in am and then another 2 puffs about 12 hours later.      Plan B = Backup Only use your albuterol as a rescue medication to be used if you can't catch your breath by resting or doing a relaxed purse lip breathing pattern.  - The less you use it, the better it will work when you need it. - Ok to use the inhaler up to 2 puffs  every 4 hours if you must but call for appointment if use goes up over your usual need - Don't leave home without it !!  (think of it like the spare tire for your car)   Plan C = Crisis - only use your albuterol nebulizer if you first try Plan B and it fails to help > ok to use the nebulizer up to every 4 hours but if start needing it regularly call for immediate appointment   Plan D = Delatasone - if B and C not adequate> Prednisone 10 mg take  4 each am x 2 days,   2 each am x 2 days,  1 each am x 2 days and stop    Plan E = ER - go to ER or call 911 if all else fails     Please schedule a follow up visit in 6  months but call sooner if needed

## 2016-07-02 NOTE — Progress Notes (Signed)
Subjective:    Patient ID: Joel Perez, male    DOB: 10-18-1945   MRN: [id number]    Brief patient profile:   40 yowm active smoker referred by Dr Moreen Fowler for eval of copd with GOLD II criteria established 02/09/13    History of Present Illness  12/27/2012 1st pulmonary eval/ Wert cc onset x 2009 am cough cough/ congestion and progresive doe with white mucus x sev tbsp x sev min better p proaire and maintained on spiriva which he feels doesn't work as well as advair. Doe x one flight steps, dragging the garbage to street stops x 2. He denies cough to me but actually has a rattling with cough maneuver but doesn't bring up much mucus, what he does bring up is mostly in ams and mucoid and some better since quit smoking  rec symbicort 160 Take 2 puffs first thing in am and then another 2 puffs about 12 hours later.  Only use your albuterol (proaire) as a rescue medication to be used if you can't catch your breath by resting or doing a relaxed purse lip breathing pattern. The less you use it, the better it will work when you need it.  As you improve ok to stop spiriva  Please schedule a follow up office visit in 6 weeks, call sooner if needed with pft's   02/09/2013 f/u ov/Wert re: GOLD II with reversibilty Chief Complaint  Patient presents with  . Follow-up    W/ PFT. Pt reports his breathing is better. He gets up tan color phlem up right after breathing tx  still rattling some but much better.  rec Continue symbicort 160 Take 2 puffs first thing in am and then another 2 puffs about 12 hours later.  Only use your proair as a rescue medication to be used if you can't catch your breath by resting or doing a relaxed purse lip breathing pattern. The less you use it, the better it will work when you need it.  Don't leave home without it [name] to leave off spiriva     03/21/2015  f/u ov/Wert re: COPD III with reversibilty on symb 160 2bid / still smoking Chief Complaint  Patient presents with  .  Follow-up    PFT done today. Breathing has improved since the last visit. He is usin proair 1-2 x per wk on average  Wants to be able to walk up inclines s sob  rec Add spiriva respimat x 2 puffs each am only right after the symbicort 160  Only use your albuterol as rescue    06/23/2015  f/u ov/Wert re: copd GOLDIII with reversibility /  symb 160 2bid and spiriva daily   Chief Complaint  Patient presents with  . Follow-up    Breathing is unchanged. He uses rescue inhaler 1 x pr wk on average.   using albuterol for noisy breathing at hs / trying e cigs but still smoking reg cigs also Doe = MMRC1 = can walk nl pace, flat grade, can't hurry or go uphills or steps s sob    rec Bevespi Take 2 puffs first thing in am and then another 2 puffs about 12 hours later and stop symbicort and spiriva for now  Only use your albuterol as a rescue medication The key is to stop smoking completely before smoking completely stops you- think of the e cigs as a one way bridge off all tobacco products   09/22/2015  f/u ov/Wert re: GOLD III copd/ maint rx =  symbicort/ spiriva resp Chief Complaint  Patient presents with  . Follow-up    Stopped smoking 1 month ago. He states breathing has been worse x 2 months- using albuterol 2 x daily on average. He has also noticed some wheezing at night.   now has neb uses at hs helps more than hfa but confused as to how to use it with his other resp meds  Doe continues = MMRC1 = can walk nl pace, flat grade, can't hurry or go uphills or steps s sob   rec Prednisone 10 mg take  4 each am x 2 days,   2 each am x 2 days,  1 each am x 2 days and stop  Plan A = Automatic = Symbicort 160 2 pffs first thing followed by Anna Genre and another 2 pffs 12 hours later  Plan B = Backup Only use your albuterol  Plan C = Crisis - only use your albuterol nebulizer if you first try Plan B and it fails to help > ok to use the nebulizer up to every 4 hours but if start needing it regularly call  for immediate appointment We will be referring you to rehab at [facility]      03/31/2016  f/u ov/Wert re:  GOLD III/ still not smoking cigs/ some ecigs / today took symbicort 2 and spivirva (one puff to make it last)  Chief Complaint  Patient presents with  . Follow-up    Breathing is doing well. He states had to stop attending rehab due to insurance issues. He states he uses rescue inhaler nightly due to wheezing.    at end of rehab able to do 2 mph flat x 15 min but has not continued at home though he does have similar equipment  Using proair 1 hour after symbicort in pm " to break up my night congestion/wheezing" Not taking either symb or spiriva as rec at previous ov ? Due to cost ? Due to not processing instructions? rec Plan A = Automatic = Symbicort 160 2 pffs first thing in AM  followed by Spriva and another 2 pffs 12 hours later of symbicort only Plan B = Backup Only use your albuterol as a rescue medication to be used if you can't catch your breath by resting or doing a relaxed purse lip breathing pattern.  - The less you use it, the better it will work when you need it. - Ok to use the inhaler up to 2 puffs  every 4 hours if you must but call for appointment if use goes up over your usual need - Don't leave home without it !!  (think of it like the spare tire for your car)  Plan C = Crisis - only use your albuterol nebulizer if you first try Plan B and it fails to help > ok to use the nebulizer up to every 4 hours but if start needing it regularly call for immediate appointment If breathing / wheezing coughing getting worse > Prednisone 10 mg take  4 each am x 2 days,   2 each am x 2 days,  1 each am x 2 days and stop  Try to walk daily if possible to let me know if you are losing any ground        07/02/2016  f/u ov/Wert re:  Copd III symb 160 no lama and saba sev times a day at most  Chief Complaint  Patient presents with  . Follow-up    COPD,  pt reports he is doing  again, better since he quit smoking  treadmlll 2 x week x 15 min s stopping 2 mph/ flat  Rest x 10 min then does another 15   No obvious day to day or daytime variability or assoc excess/ purulent sputum or mucus plugs or hemoptysis or cp or chest tightness, subjective wheeze or overt sinus or hb symptoms. No unusual exp hx or h/o childhood pna/ asthma or knowledge of premature birth.  Sleeping ok without nocturnal  or early am exacerbation  of respiratory  c/o's or need for noct saba. Also denies any obvious fluctuation of symptoms with weather or environmental changes or other aggravating or alleviating factors except as outlined above   Current Medications, Allergies, Complete Past Medical History, Past Surgical History, Family History, and Social History were reviewed in [employer] record.  ROS  The following are not active complaints unless bolded sore throat, dysphagia, dental problems, itching, sneezing,  nasal congestion or excess/ purulent secretions, ear ache,   fever, chills, sweats, unintended wt loss, classically pleuritic or exertional cp,  orthopnea pnd or leg swelling, presyncope, palpitations, abdominal pain, anorexia, nausea, vomiting, diarrhea  or change in bowel or bladder habits, change in stools or urine, dysuria,hematuria,  rash, arthralgias, visual complaints, headache, numbness, weakness or ataxia or problems with walking or coordination,  change in mood/affect or memory.                        Objective:   Physical Exam  01/18/2014       179 > 02/07/2015 190 > 03/21/2015   192 > 06/23/2015  196 >   12/30/2015 198 > 03/31/2016 198 >  07/02/2016  201     02/09/13 177 lb (80.287 kg)  12/27/12 173 lb 12.8 oz (78.835 kg)    Elderly amb wm nad    Vital signs reviewed  - Note on arrival 02 sats  97% on RA   HEENT mild turbinate edema.  Extremely poor lower dentition / upper edentulous/  Oropharynx no thrush or excess pnd or cobblestoning.  No JVD or  cervical adenopathy. Mild accessory muscle hypertrophy. Trachea midline, nl thryroid. Chest was hyperinflated by percussion with diminished breath sounds and moderate increased exp time with bilataeral end  exp bilateral exp rhonchi. Hoover sign positive at mid inspiration. Regular rate and rhythm without murmur gallop or rub or increase P2 or edema.  Abd: obese/no hsm, nl excursion. Ext warm without cyanosis or clubbing.       CXR PA and Lateral:   03/31/2016 :    I personally reviewed images and agree with radiology impression as follows:   Mild hyper expansion with right base scarring. No edema or consolidation. Stable cardiac silhouette. Aortic atherosclerosis noted.      Assessment & Plan:

## 2016-07-14 ENCOUNTER — Ambulatory Visit: Payer: Medicare Other | Admitting: Neurology

## 2016-07-16 ENCOUNTER — Encounter: Payer: Self-pay | Admitting: Neurology

## 2016-07-16 ENCOUNTER — Ambulatory Visit (INDEPENDENT_AMBULATORY_CARE_PROVIDER_SITE_OTHER): Payer: Medicare Other | Admitting: Neurology

## 2016-07-16 ENCOUNTER — Other Ambulatory Visit: Payer: Medicare Other

## 2016-07-16 VITALS — BP 128/88 | HR 97 | Ht 70.5 in | Wt 197.0 lb

## 2016-07-16 DIAGNOSIS — G621 Alcoholic polyneuropathy: Secondary | ICD-10-CM

## 2016-07-16 MED ORDER — GABAPENTIN 300 MG PO CAPS: ORAL_CAPSULE | ORAL | 5 refills | Status: DC

## 2016-07-16 NOTE — Progress Notes (Signed)
[facility] Clinic Note - Initial Visit   Date: 07/16/16  Joel Perez MRN: [id number] DOB: 05/31/1945   Dear Dr. Nelva Bush:  Thank you for your kind referral of Joel Perez for consultation of bilateral feet paresthesias. Although his history is well known to you, please allow [place] to reiterate it for the purpose of our medical record. The patient was accompanied to the clinic by self.    History of Present Illness: Joel Perez is a 71 y.o. left-handed Caucasian male with chronic low back pain, COPD, and tobacco user presenting for evaluation of bilateral feet paresthesias.    For the past year, he has noticed needle-like sensation over the soles of both feet, worse on the left.  Symptoms are intermittent and very brief, lasting a few seconds but can recur throughout the day.  He also has some tingling and numbness.  He also complains of imbalance, but walks independently and has not suffered any falls. No weakness of the feet.  There is no identifiable triggers.  He has been drinking beer since his 63s, and was drinking 6 pack beer nightly x 10 years and for the past 30 years, 2-4 beers nightly.  No history of diabetes or family history of neuropathy.  He sees Dr. Nelva Bush for chronic low back pain and takes oxycodone and flexeril for this.    Out-side paper records, electronic medical record, and images have been reviewed where available and summarized as:  MRI lumbar spine wo contrast 08/06/2004: 1.  Small central protrusion L3-4.  Small superimposed foraminal component on the right; right L-3 nerve root encroachment may be present. 2.  Mild bulge L4-5 associated with facet arthropathy and a small leftward foraminal protrusion but no definite L-4 or L-5 nerve root encroachment.   3.  Small foraminal protrusion L2-3, right without definite L-2 nerve root compromise. 4.  Lower lumbar facet arthropathy, worst at L4-5.   Past Medical History:  Diagnosis Date  .  COPD (chronic obstructive pulmonary disease) ([place])   . Low back pain   . Neuropathy [facility])     Past Surgical History:  Procedure Laterality Date  . COLON SURGERY  2000  . ELBOW SURGERY       Medications:  Outpatient Encounter Prescriptions as of 07/16/2016  Medication Sig  . albuterol (PROAIR HFA) 108 (90 BASE) MCG/ACT inhaler INHALE 2 PUFFS INTO THE LUNGS EVERY 6 HOURS AS NEEDED FOR WHEEZING OR SHORTNESS OF BREATH  . albuterol (PROVENTIL) (2.5 MG/3ML) 0.083% nebulizer solution Take 3 mLs (2.5 mg total) by nebulization every 6 (six) hours as needed for wheezing or shortness of breath.  Marland Kitchen aspirin 81 MG tablet Take 81 mg by mouth daily.  Marland Kitchen b complex vitamins tablet Take 1 tablet by mouth daily.  . budesonide-formoterol (SYMBICORT) 160-4.5 MCG/ACT inhaler Inhale 2 puffs into the lungs 2 (two) times daily.  . Cyclobenzaprine HCl (FLEXERIL PO) Take 1 tablet by mouth 3 (three) times daily as needed.  . Multiple Vitamin (MULTIVITAMIN) capsule Take 1 capsule by mouth daily.  Marland Kitchen oxyCODONE-acetaminophen (PERCOCET/ROXICET) 5-325 MG per tablet Take 1 tablet by mouth every 6 (six) hours as needed.  . predniSONE (DELTASONE) 10 MG tablet Take  4 each am x 2 days,   2 each am x 2 days,  1 each am x 2 days and stop  . Sildenafil Citrate (VIAGRA PO) Take 1 tablet by mouth as needed.  . sodium chloride (OCEAN) 0.65 % nasal spray Place 1 spray into the  nose as needed for congestion.  . gabapentin (NEURONTIN) 300 MG capsule Take 1 tablet at bedtime x 1 week, then increase to 1 tablet twice daily.   No facility-administered encounter medications on file as of 07/16/2016.      Allergies:  Allergies  Allergen Reactions  . Sulfa Antibiotics Hives    Family History: Family History  Problem Relation Age of Onset  . Heart disease Father   . Breast cancer Mother   . Other Sister   . Brain cancer Brother     Social History: Social History  Substance Use Topics  . Smoking status: Former Smoker     Packs/day: 2.00    Years: 50.00    Types: Cigarettes    Quit date: 08/23/2015  . Smokeless tobacco: Never Used  . Alcohol use 0.0 oz/week     Comment: 2 beers a day "couple beers"   Social History   Social History Narrative   Retired Animal nutritionist.   He lives with wife.    Highest level of education:  Trade school    Review of Systems:  CONSTITUTIONAL: No fevers, chills, night sweats, or weight loss.   EYES: No visual changes or eye pain ENT: No hearing changes.  No history of nose bleeds.   RESPIRATORY: No cough, wheezing and shortness of breath.   CARDIOVASCULAR: Negative for chest pain, and palpitations.   GI: Negative for abdominal discomfort, blood in stools or black stools.  No recent change in bowel habits.   GU:  No history of incontinence.   MUSCLOSKELETAL: No history of joint pain or swelling.  No myalgias.   SKIN: Negative for lesions, rash, and itching.   HEMATOLOGY/ONCOLOGY: Negative for prolonged bleeding, bruising easily, and swollen nodes.  No history of cancer.   ENDOCRINE: Negative for cold or heat intolerance, polydipsia or goiter.   PSYCH:  No depression or anxiety symptoms.   NEURO: As Above.   Vital Signs:  BP 128/88   Pulse 97   Ht 5' 10.5" (1.791 m)   Wt 197 lb (89.4 kg)   BMI 27.87 kg/m    General Medical Exam:   General:  Well appearing, comfortable.   Eyes/ENT: see cranial nerve examination.   Neck: No masses appreciated.  Full range of motion without tenderness.  No carotid bruits. Respiratory:  Clear to auscultation, good air entry bilaterally.   Cardiac:  Regular rate and rhythm, no murmur.   Extremities:  No deformities, edema, or skin discoloration.  Skin:  No rashes or lesions.  Neurological Exam: MENTAL STATUS including orientation to time, place, person, recent and remote memory, attention span and concentration, language, and fund of knowledge is normal.  Speech is not dysarthric.  CRANIAL NERVES: II:  No visual field defects.   Unremarkable fundi.   III-IV-VI: Pupils equal round and reactive to light.  Normal conjugate, extra-ocular eye movements in all directions of gaze.  No nystagmus.  No ptosis.   V:  Normal facial sensation.    VII:  Normal facial symmetry and movements. VIII:  Normal hearing and vestibular function.   IX-X:  Normal palatal movement.   XI:  Normal shoulder shrug and head rotation.   XII:  Normal tongue strength and range of motion, no deviation or fasciculation.  MOTOR:  No atrophy, fasciculations or abnormal movements.  No pronator drift.  Tone is normal.    Right Upper Extremity:    Left Upper Extremity:    Deltoid  5/5   Deltoid  5/5  Biceps  5/5   Biceps  5/5   Triceps  5/5   Triceps  5/5   Wrist extensors  5/5   Wrist extensors  5/5   Wrist flexors  5/5   Wrist flexors  5/5   Finger extensors  5/5   Finger extensors  5/5   Finger flexors  5/5   Finger flexors  5/5   Dorsal interossei  5/5   Dorsal interossei  5/5   Abductor pollicis  5/5   Abductor pollicis  5/5   Tone (Ashworth scale)  0  Tone (Ashworth scale)  0   Right Lower Extremity:    Left Lower Extremity:    Hip flexors  5/5   Hip flexors  5/5   Hip extensors  5/5   Hip extensors  5/5   Knee flexors  5/5   Knee flexors  5/5   Knee extensors  5/5   Knee extensors  5/5   Dorsiflexors  5/5   Dorsiflexors  5/5   Plantarflexors  5/5   Plantarflexors  5/5   Toe extensors  5/5   Toe extensors  5/5   Toe flexors  4+/5   Toe flexors  4+/5   Tone (Ashworth scale)  0  Tone (Ashworth scale)  0   MSRs:  Right                                                                 Left brachioradialis 2+  brachioradialis 2+  biceps 2+  biceps 2+  triceps 2+  triceps 2+  patellar 2+  patellar 2+  ankle jerk 0  ankle jerk 0  Hoffman no  Hoffman no  plantar response down  plantar response down   SENSORY:  Temperature and vibration is reduced distal to ankles bilaterally.  Light touch, pin prick, and proprioception is intact.  Rhomberg  sign is absent.  COORDINATION/GAIT: Normal finger-to- nose-finger and heel-to-shin.  Intact rapid alternating movements bilaterally.  Gait narrow based and stable. Stressed gait intact.  He is unsteady with tandem gait, but able to perform.   IMPRESSION: Mr. Nelli is a 71 year-old gentleman referred for evaluation of bilateral feet painful paresthesias.  His history and exam is most consistent with a distal and symmetric polyneuropathy affecting the feet. The underlying etiology is his long history of alcohol use. He was previously drinking 6-pack beer nightly x 10 years and for the past 30 years drinks 2-4 beers nightly.  I had extensive discussion with the patient regarding the pathogenesis, etiology, management, and natural course of neuropathy. Neuropathy tends to be slowly progressive, especially if he continues to consume alcohol.  I explained that the neurotoxic effects of alcohol can take years to manifest and this is unlikely to be reversible, but cessation would prevent worsening.  To be sure there is not a superimposed S1 radiculopathy, he will have NCS/EMG of the legs.  Laboratory testing for secondary causes of neuropathy will also be checked.   PLAN/RECOMMENDATIONS:  1.  Check vitamin B12, vitamin B1, folate, TSH, copper, SPEP with IFE 2.  NCS/EMG of the legs 3.  Start gabapentin 300mg  at bedtime x 1 week, then increase to 1 tablet twice daily 4.  Alcohol cessation recommended  Return to clinic in 4 months.  The duration of this appointment visit was 40 minutes of face-to-face time with the patient.  Greater than 50% of this time was spent in counseling, explanation of diagnosis, planning of further management, and coordination of care.   Thank you for allowing me to participate in patient's care.  If I can answer any additional questions, I would be pleased to do so.    Sincerely,    Donika K. Posey Pronto, DO

## 2016-07-16 NOTE — Progress Notes (Signed)
Note sent to Dr. Nelva Bush.

## 2016-07-16 NOTE — Patient Instructions (Addendum)
1.  Check blood work. Your provider has requested that you have labwork completed today. Please go to [facility] ([id number]) on the second floor of this building before leaving the office today. You do not need to check in. If you are not called within 15 minutes please check with the front desk.  2.  NCS/EMG of the legs. ELECTROMYOGRAM AND NERVE CONDUCTION STUDIES (EMG/NCS) INSTRUCTIONS  How to Prepare The neurologist conducting the EMG will need to know if you have certain medical conditions. Tell the neurologist and other EMG lab personnel if you: . Have a pacemaker or any other electrical medical device . Take blood-thinning medications . Have hemophilia, a blood-clotting disorder that causes prolonged bleeding Bathing Take a shower or bath shortly before your exam in order to remove oils from your skin. Don't apply lotions or creams before the exam.  What to Expect You'll likely be asked to change into a hospital gown for the procedure and lie down on an examination table. The following explanations can help you understand what will happen during the exam.  . Electrodes. The neurologist or a technician places surface electrodes at various locations on your skin depending on where you're experiencing symptoms. Or the neurologist may insert needle electrodes at different sites depending on your symptoms.  . Sensations. The electrodes will at times transmit a tiny electrical current that you may feel as a twinge or spasm. The needle electrode may cause discomfort or pain that usually ends shortly after the needle is removed. If you are concerned about discomfort or pain, you may want to talk to the neurologist about taking a short break during the exam.  . Instructions. During the needle EMG, the neurologist will assess whether there is any spontaneous electrical activity when the muscle is at rest - activity that isn't present in healthy muscle tissue - and the degree of activity when  you slightly contract the muscle.  He or she will give you instructions on resting and contracting a muscle at appropriate times. Depending on what muscles and nerves the neurologist is examining, he or she may ask you to change positions during the exam.  After your EMG You may experience some temporary, minor bruising where the needle electrode was inserted into your muscle. This bruising should fade within several days. If it persists, contact your primary care doctor.   3.  Start gabapentin 300mg  at bedtime x 1 week, then increase to 1 tablet twice daily 4.  Strongly recommend cutting back, if not stopping, alcohol   Return to clinic 3 months

## 2016-07-17 LAB — VITAMIN B12: Vitamin B-12: 569 pg/mL (ref 200–1100)

## 2016-07-17 LAB — FOLATE: Folate: 14.6 ng/mL (ref >5.4)

## 2016-07-18 LAB — COPPER, SERUM: Copper: 105 ug/dL (ref 70–175)

## 2016-07-20 LAB — PROTEIN ELECTROPHORESIS, SERUM
Albumin ELP: 4.4 g/dL (ref 3.8–4.8)
Alpha-1-Globulin: 0.3 g/dL (ref 0.2–0.3)
Alpha-2-Globulin: 0.7 g/dL (ref 0.5–0.9)
Beta 2: 0.4 g/dL (ref 0.2–0.5)
Beta Globulin: 0.5 g/dL (ref 0.4–0.6)
Gamma Globulin: 1 g/dL (ref 0.8–1.7)
Total Protein, Serum Electrophoresis: 7.3 g/dL (ref 6.1–8.1)

## 2016-07-20 LAB — IMMUNOFIXATION ELECTROPHORESIS
IgA: 320 mg/dL (ref 81–463)
IgG (Immunoglobin G), Serum: 1043 mg/dL (ref 694–1618)
IgM, Serum: 114 mg/dL (ref 48–271)

## 2016-07-20 LAB — VITAMIN B1: Vitamin B1 (Thiamine): 19 nmol/L (ref 8–30)

## 2016-07-21 ENCOUNTER — Telehealth: Payer: Self-pay | Admitting: *Deleted

## 2016-07-21 NOTE — Telephone Encounter (Signed)
Patient notified

## 2016-07-21 NOTE — Telephone Encounter (Signed)
-----   Message from Alda Berthold, DO sent at 07/20/2016  5:21 PM EST ----- Please notify patient lab are within normal limits.  Thank you.

## 2016-07-22 ENCOUNTER — Ambulatory Visit (INDEPENDENT_AMBULATORY_CARE_PROVIDER_SITE_OTHER): Payer: Medicare Other | Admitting: Neurology

## 2016-07-22 ENCOUNTER — Telehealth: Payer: Self-pay | Admitting: Neurology

## 2016-07-22 DIAGNOSIS — G621 Alcoholic polyneuropathy: Secondary | ICD-10-CM | POA: Diagnosis not present

## 2016-07-22 NOTE — Telephone Encounter (Signed)
Results of EMG discussed with patient which shows primarily sensory neuropathy. I encouraged him to continue to reduce his alcohol intake as this is the reason for his neuropathy.   Donika K. Posey Pronto, DO

## 2016-07-22 NOTE — Procedures (Signed)
[facility] Neurology  [address]  [place], [place] [address] Tel: [phone] Fax:  [phone] Test Date:  07/22/2016  Patient: Joel Perez DOB: 1945/09/22 Physician: Narda Amber, DO  Sex: Male Height: 5\' 10"  Ref Phys: Narda Amber, DO  ID#: [id number] Temp: 32.9C Technician: Jerilynn Mages. Dean   Patient Complaints: This is a 71 year old gentleman referred for evaluation of bilateral lower extremity shooting pain and numbness.  NCV & EMG Findings: Extensive electrodiagnostic testing of the left lower extremity and additional studies of the right shows:  1. Bilateral sural and superficial peroneal sensory responses are absent. 2. Bilateral peroneal and tibial motor responses are within normal limits. 3. Bilateral tibial H reflex studies show prolonged latency.  4. Sparse chronic motor axon loss changes isolated to the flexor digitorum longus muscles bilaterally. There is no evidence of accompanied active denervation.  Impression: The electrophysiologic findings are most consistent with a predominant sensory polyneuropathy, axon loss in type, affecting the lower extremities.   ___________________________ Narda Amber, DO    Nerve Conduction Studies Anti Sensory Summary Table   Stim Site NR Peak (ms) Norm Peak (ms) P-T Amp (V) Norm P-T Amp  Left Sup Peroneal Anti Sensory (Ant Lat Mall)  32.9C  12 cm NR  <4.6  >3  Right Sup Peroneal Anti Sensory (Ant Lat Mall)  32.9C  12 cm NR  <4.6  >3  Left Sural Anti Sensory (Lat Mall)  32.9C  Calf NR  <4.6  >3  Right Sural Anti Sensory (Lat Mall)  32.9C  Calf NR  <4.6  >3   Motor Summary Table   Stim Site NR Onset (ms) Norm Onset (ms) O-P Amp (mV) Norm O-P Amp Site1 Site2 Delta-0 (ms) Dist (cm) Vel (m/s) Norm Vel (m/s)  Left Peroneal Motor (Ext Dig Brev)  32.9C  Ankle    4.8 <6.0 4.9 >2.5 B Fib Ankle 8.5 35.0 41 >40  B Fib    13.3  3.6  Poplt B Fib 2.2 10.0 45 >40  Poplt    15.5  3.6         Right Peroneal Motor (Ext  Dig Brev)  32.9C  Ankle    4.0 <6.0 4.8 >2.5 B Fib Ankle 7.9 35.0 44 >40  B Fib    11.9  3.8  Poplt B Fib 1.9 10.0 53 >40  Poplt    13.8  3.4         Left Tibial Motor (Abd Hall Brev)  32.9C  Ankle    4.1 <6.0 16.4 >4 Knee Ankle 9.5 40.0 42 >40  Knee    13.6  9.8         Right Tibial Motor (Abd Hall Brev)  32.9C  Ankle    4.8 <6.0 13.2 >4 Knee Ankle 9.3 40.0 43 >40  Knee    14.1  11.5          H Reflex Studies   NR H-Lat (ms) Lat Norm (ms) L-R H-Lat (ms) M-Lat (ms) HLat-MLat (ms)  Left Tibial (Gastroc)  32.9C     38.78 <35 1.36 4.49 34.29  Right Tibial (Gastroc)  32.9C     37.41 <35 1.36 4.49 32.92   EMG   Side Muscle Ins Act Fibs Psw Fasc Number Recrt Dur Dur. Amp Amp. Poly Poly. Comment  Right AntTibialis Nml Nml Nml Nml Nml Nml Nml Nml Nml Nml Nml Nml N/A  Right Gastroc Nml Nml Nml Nml Nml Nml Nml Nml Nml Nml Nml Nml N/A  Right Flex Dig Long Nml Nml Nml Nml 1- Rapid Some 1+ Some 1+ Nml Nml N/A  Left AntTibialis Nml Nml Nml Nml Nml Nml Nml Nml Nml Nml Nml Nml N/A  Left Gastroc Nml Nml Nml Nml Nml Nml Nml Nml Nml Nml Nml Nml N/A  Left GluteusMed Nml Nml Nml Nml Nml Nml Nml Nml Nml Nml Nml Nml N/A  Left BicepsFemS Nml Nml Nml Nml Nml Nml Nml Nml Nml Nml Nml Nml N/A  Left Flex Dig Long Nml Nml Nml Nml 1- Rapid Some 2-.3- Some 1+ Nml Nml N/A  Left RectFemoris Nml Nml Nml Nml Nml Nml Nml Nml Nml Nml Nml Nml N/A      Waveforms:

## 2016-08-25 ENCOUNTER — Telehealth: Payer: Self-pay | Admitting: Internal Medicine

## 2016-08-25 MED ORDER — ALBUTEROL SULFATE HFA 108 (90 BASE) MCG/ACT IN AERS: INHALATION_SPRAY | RESPIRATORY_TRACT | 5 refills | Status: DC

## 2016-08-25 NOTE — Telephone Encounter (Addendum)
Spoke with pt and verified which pharmacy he needed this sent to. His rx was sent in. Nothing further is needed at this time.

## 2016-09-28 ENCOUNTER — Encounter (HOSPITAL_COMMUNITY): Payer: Self-pay | Admitting: Emergency Medicine

## 2016-09-28 ENCOUNTER — Ambulatory Visit (INDEPENDENT_AMBULATORY_CARE_PROVIDER_SITE_OTHER): Payer: Medicare Other

## 2016-09-28 ENCOUNTER — Ambulatory Visit (HOSPITAL_COMMUNITY)
Admission: EM | Admit: 2016-09-28 | Discharge: 2016-09-28 | Disposition: A | Discharge status: Home / Self Care | Payer: Medicare Other | Attending: Internal Medicine | Admitting: Internal Medicine

## 2016-09-28 DIAGNOSIS — R0981 Nasal congestion: Secondary | ICD-10-CM | POA: Diagnosis not present

## 2016-09-28 DIAGNOSIS — R05 Cough: Secondary | ICD-10-CM

## 2016-09-28 DIAGNOSIS — R600 Localized edema: Secondary | ICD-10-CM | POA: Diagnosis not present

## 2016-09-28 NOTE — Discharge Instructions (Signed)
Your chest x-ray is clear, no fluid. The lab work that you had performed approximately 2 weeks ago was normal and did not suggest kidney problems. This is likely a problem with circulation in your legs called venous insufficiency however he would need to have studies to actually show that. Thus while you need to call your doctor to make an appointment for some tests. If you develop any new symptoms, chest pain, trouble breathing especially when lying down or increase swelling in your legs and cannot see your doctor then go to the emergency department.

## 2016-09-28 NOTE — ED Triage Notes (Signed)
Pt states he has been suffering from bilateral lower extremity swelling for almost three weeks.  Pt has 2+ edema in both feet up to his knees.

## 2016-09-28 NOTE — ED Provider Notes (Signed)
CSN: [id number]     Arrival date & time 09/28/16  1328 History   First MD Initiated Contact with Patient 09/28/16 1647     Chief Complaint  Patient presents with  . Foot Swelling    bilateral   (Consider location/radiation/quality/duration/timing/severity/associated sxs/prior Treatment) 71 year old male visits to the urgent care with a 2-3 week history of bilateral lower extremity edema. He states when he saw his PCP a couple weeks ago he told his doctor about it but states it was not examined and was advised to eat less salt. It has been getting worse. He denies any increase or change in shortness of breath as he has COPD smoking two pack per day for 5 decades. He states that he also has pain in the same areas that is different than his usual neuropathy.      Past Medical History:  Diagnosis Date  . COPD (chronic obstructive pulmonary disease) ([place])   . Low back pain   . Neuropathy    Past Surgical History:  Procedure Laterality Date  . COLON SURGERY  2000  . ELBOW SURGERY     Family History  Problem Relation Age of Onset  . Heart disease Father   . Breast cancer Mother   . Other Sister   . Brain cancer Brother    Social History  Substance Use Topics  . Smoking status: Current Some Day Smoker    Packs/day: 2.00    Years: 50.00    Types: Cigarettes    Last attempt to quit: 08/23/2015  . Smokeless tobacco: Never Used  . Alcohol use 0.0 oz/week     Comment: 2 beers a day "couple beers"    Review of Systems  Constitutional: Negative for activity change, fatigue and fever.  HENT: Negative.   Respiratory:       No change in respiratory status. Has COPD and chronic shortness of breath.  Cardiovascular: Positive for leg swelling. Negative for chest pain and palpitations.  Gastrointestinal: Negative.   Genitourinary: Negative.   Skin: Positive for color change.       Beginning to have some change in color of the lower extremities right greater than left with erythema.   Neurological: Negative.   All other systems reviewed and are negative.   Allergies  Sulfa antibiotics  Home Medications   Prior to Admission medications   Medication Sig Start Date End Date Taking? Authorizing Provider  albuterol (PROAIR HFA) 108 (90 Base) MCG/ACT inhaler INHALE 2 PUFFS INTO THE LUNGS EVERY 6 HOURS AS NEEDED FOR WHEEZING OR SHORTNESS OF BREATH 08/25/16  Yes Tanda Rockers, MD  albuterol (PROVENTIL) (2.5 MG/3ML) 0.083% nebulizer solution Take 3 mLs (2.5 mg total) by nebulization every 6 (six) hours as needed for wheezing or shortness of breath. 09/22/15  Yes Tanda Rockers, MD  aspirin 81 MG tablet Take 81 mg by mouth daily.   Yes [provider]  b complex vitamins tablet Take 1 tablet by mouth daily.   Yes [provider]  budesonide-formoterol (SYMBICORT) 160-4.5 MCG/ACT inhaler Inhale 2 puffs into the lungs 2 (two) times daily. 01/22/16  Yes Tanda Rockers, MD  Cyclobenzaprine HCl (FLEXERIL PO) Take 1 tablet by mouth 3 (three) times daily as needed.   Yes [provider]  gabapentin (NEURONTIN) 300 MG capsule Take 1 tablet at bedtime x 1 week, then increase to 1 tablet twice daily. 07/16/16  Yes Narda Amber K, DO  Multiple Vitamin (MULTIVITAMIN) capsule Take 1 capsule by mouth daily.  Yes [provider]  oxyCODONE-acetaminophen (PERCOCET/ROXICET) 5-325 MG per tablet Take 1 tablet by mouth every 6 (six) hours as needed. 01/10/15  Yes [provider]  predniSONE (DELTASONE) 10 MG tablet Take  4 each am x 2 days,   2 each am x 2 days,  1 each am x 2 days and stop 03/31/16   Tanda Rockers, MD  Sildenafil Citrate (VIAGRA PO) Take 1 tablet by mouth as needed.    [provider]  sodium chloride (OCEAN) 0.65 % nasal spray Place 1 spray into the nose as needed for congestion.    [provider]   Meds Ordered and Administered this Visit  Medications - No data to display  BP (!) 166/75 (BP Location: Right Arm)    Pulse 79   Temp 98 F (36.7 C) (Oral)   SpO2 98%  No data found.   Physical Exam  Constitutional: He appears well-developed and well-nourished. No distress.  Eyes: EOM are normal.  Neck: Normal range of motion. Neck supple.  Cardiovascular: Normal rate, regular rhythm and intact distal pulses.   Murmur heard. Pulmonary/Chest: He has wheezes. He has rales.  Mildly increased respiratory effort. Prolonged expiratory phase, poor air movement and scattered wheezing. Distant basilar crackles.  Musculoskeletal: He exhibits edema.  2-3+ pitting edema in the lower extremities, right greater than left. Mild erythematous discoloration asked about the ankles. The edema involves the feet, ankles and lower extremities up to the knee home the right and about three quarters of the way up on the left. Unable to palpate a pulse secondary to edema. Normal warmth.  Neurological: He is alert.  Skin: Skin is warm and dry.  Nursing note and vitals reviewed.   Urgent Care Course     Procedures (including critical care time)  Labs Review Labs Reviewed - No data to display  Imaging Review Dg Chest 2 View  Result Date: 09/28/2016 CLINICAL DATA:  Two to three-week history of coughing congestion. EXAM: CHEST  2 VIEW COMPARISON:  03/31/2016 FINDINGS: Lungs are hyperexpanded. Interstitial markings are diffusely coarsened with chronic features. Posterior atelectasis or scarring at the bases is unchanged. Cardiopericardial silhouette is at upper limits of normal for size. The visualized bony structures of the thorax are intact. IMPRESSION: Hyperexpansion with underlying chronic interstitial changes. No acute cardiopulmonary findings. Electronically Signed   By: Misty Stanley M.D.   On: 09/28/2016 17:23     Visual Acuity Review  Right Eye Distance:   Left Eye Distance:   Bilateral Distance:    Right Eye Near:   Left Eye Near:    Bilateral Near:         MDM   1. Bilateral lower extremity edema    Your chest x-ray is clear, no fluid. The lab work that you had performed approximately 2 weeks ago was normal and did not suggest kidney problems. This is likely a problem with circulation in your legs called venous insufficiency however he would need to have studies to actually show that. Thus while you need to call your doctor to make an appointment for some tests. If you develop any new symptoms, chest pain, trouble breathing especially when lying down or increase swelling in your legs and cannot see your doctor then go to the emergency department.     Janne Napoleon, NP 09/28/16 1744

## 2016-12-29 ENCOUNTER — Ambulatory Visit (HOSPITAL_COMMUNITY)
Admission: RE | Admit: 2016-12-29 | Discharge: 2016-12-29 | Disposition: A | Discharge status: Home / Self Care | Payer: Medicare Other | Source: Ambulatory Visit | Attending: Family Medicine | Admitting: Family Medicine

## 2016-12-29 DIAGNOSIS — I350 Nonrheumatic aortic (valve) stenosis: Secondary | ICD-10-CM | POA: Diagnosis not present

## 2016-12-29 DIAGNOSIS — E785 Hyperlipidemia, unspecified: Secondary | ICD-10-CM | POA: Diagnosis not present

## 2016-12-29 DIAGNOSIS — J449 Chronic obstructive pulmonary disease, unspecified: Secondary | ICD-10-CM | POA: Insufficient documentation

## 2016-12-29 DIAGNOSIS — Z720 Tobacco use: Secondary | ICD-10-CM | POA: Diagnosis not present

## 2016-12-29 DIAGNOSIS — G621 Alcoholic polyneuropathy: Secondary | ICD-10-CM | POA: Insufficient documentation

## 2016-12-29 DIAGNOSIS — I080 Rheumatic disorders of both mitral and aortic valves: Secondary | ICD-10-CM | POA: Diagnosis not present

## 2016-12-29 DIAGNOSIS — R600 Localized edema: Secondary | ICD-10-CM | POA: Diagnosis not present

## 2016-12-29 HISTORY — PX: TRANSTHORACIC ECHOCARDIOGRAM: SHX275

## 2016-12-29 NOTE — Progress Notes (Signed)
*  PRELIMINARY RESULTS* Echocardiogram 2D Echocardiogram has been performed.  Leavy Cella 12/29/2016, 1:46 PM

## 2016-12-30 ENCOUNTER — Encounter: Payer: Self-pay | Admitting: Internal Medicine

## 2016-12-30 ENCOUNTER — Ambulatory Visit (INDEPENDENT_AMBULATORY_CARE_PROVIDER_SITE_OTHER): Payer: Medicare Other | Admitting: Internal Medicine

## 2016-12-30 VITALS — BP 140/90 | HR 93 | Ht 70.5 in | Wt 201.8 lb

## 2016-12-30 DIAGNOSIS — M7989 Other specified soft tissue disorders: Secondary | ICD-10-CM | POA: Diagnosis not present

## 2016-12-30 DIAGNOSIS — F17210 Nicotine dependence, cigarettes, uncomplicated: Secondary | ICD-10-CM | POA: Diagnosis not present

## 2016-12-30 DIAGNOSIS — J449 Chronic obstructive pulmonary disease, unspecified: Secondary | ICD-10-CM

## 2016-12-30 MED ORDER — TIOTROPIUM BROMIDE MONOHYDRATE 2.5 MCG/ACT IN AERS: 2.0000 | INHALATION_SPRAY | Freq: Every day | RESPIRATORY_TRACT | 11 refills | Status: DC

## 2016-12-30 MED ORDER — TIOTROPIUM BROMIDE MONOHYDRATE 2.5 MCG/ACT IN AERS: 2.0000 | INHALATION_SPRAY | Freq: Every day | RESPIRATORY_TRACT | 0 refills | Status: DC

## 2016-12-30 NOTE — Progress Notes (Signed)
Subjective:    Patient ID: Joel Perez, male    DOB: 10-18-1945   MRN: [id number]    Brief patient profile:   40 yowm active smoker referred by Dr Moreen Fowler for eval of copd with GOLD II criteria established 02/09/13    History of Present Illness  12/27/2012 1st pulmonary eval/ Wert cc onset x 2009 am cough cough/ congestion and progresive doe with white mucus x sev tbsp x sev min better p proaire and maintained on spiriva which he feels doesn't work as well as advair. Doe x one flight steps, dragging the garbage to street stops x 2. He denies cough to me but actually has a rattling with cough maneuver but doesn't bring up much mucus, what he does bring up is mostly in ams and mucoid and some better since quit smoking  rec symbicort 160 Take 2 puffs first thing in am and then another 2 puffs about 12 hours later.  Only use your albuterol (proaire) as a rescue medication to be used if you can't catch your breath by resting or doing a relaxed purse lip breathing pattern. The less you use it, the better it will work when you need it.  As you improve ok to stop spiriva  Please schedule a follow up office visit in 6 weeks, call sooner if needed with pft's   02/09/2013 f/u ov/Wert re: GOLD II with reversibilty Chief Complaint  Patient presents with  . Follow-up    W/ PFT. Pt reports his breathing is better. He gets up tan color phlem up right after breathing tx  still rattling some but much better.  rec Continue symbicort 160 Take 2 puffs first thing in am and then another 2 puffs about 12 hours later.  Only use your proair as a rescue medication to be used if you can't catch your breath by resting or doing a relaxed purse lip breathing pattern. The less you use it, the better it will work when you need it.  Don't leave home without it [name] to leave off spiriva     03/21/2015  f/u ov/Wert re: COPD III with reversibilty on symb 160 2bid / still smoking Chief Complaint  Patient presents with  .  Follow-up    PFT done today. Breathing has improved since the last visit. He is usin proair 1-2 x per wk on average  Wants to be able to walk up inclines s sob  rec Add spiriva respimat x 2 puffs each am only right after the symbicort 160  Only use your albuterol as rescue    06/23/2015  f/u ov/Wert re: copd GOLDIII with reversibility /  symb 160 2bid and spiriva daily   Chief Complaint  Patient presents with  . Follow-up    Breathing is unchanged. He uses rescue inhaler 1 x pr wk on average.   using albuterol for noisy breathing at hs / trying e cigs but still smoking reg cigs also Doe = MMRC1 = can walk nl pace, flat grade, can't hurry or go uphills or steps s sob    rec Bevespi Take 2 puffs first thing in am and then another 2 puffs about 12 hours later and stop symbicort and spiriva for now  Only use your albuterol as a rescue medication The key is to stop smoking completely before smoking completely stops you- think of the e cigs as a one way bridge off all tobacco products   09/22/2015  f/u ov/Wert re: GOLD III copd/ maint rx =  symbicort/ spiriva resp Chief Complaint  Patient presents with  . Follow-up    Stopped smoking 1 month ago. He states breathing has been worse x 2 months- using albuterol 2 x daily on average. He has also noticed some wheezing at night.   now has neb uses at hs helps more than hfa but confused as to how to use it with his other resp meds  Doe continues = MMRC1 = can walk nl pace, flat grade, can't hurry or go uphills or steps s sob   rec Prednisone 10 mg take  4 each am x 2 days,   2 each am x 2 days,  1 each am x 2 days and stop  Plan A = Automatic = Symbicort 160 2 pffs first thing followed by Anna Genre and another 2 pffs 12 hours later  Plan B = Backup Only use your albuterol  Plan C = Crisis - only use your albuterol nebulizer if you first try Plan B and it fails to help > ok to use the nebulizer up to every 4 hours but if start needing it regularly call  for immediate appointment We will be referring you to rehab at [facility]      03/31/2016  f/u ov/Wert re:  GOLD III/ still not smoking cigs/ some ecigs / today took symbicort 2 and spivirva (one puff to make it last)  Chief Complaint  Patient presents with  . Follow-up    Breathing is doing well. He states had to stop attending rehab due to insurance issues. He states he uses rescue inhaler nightly due to wheezing.    at end of rehab able to do 2 mph flat x 15 min but has not continued at home though he does have similar equipment  Using proair 1 hour after symbicort in pm " to break up my night congestion/wheezing" Not taking either symb or spiriva as rec at previous ov ? Due to cost ? Due to not processing instructions? rec Plan A = Automatic = Symbicort 160 2 pffs first thing in AM  followed by Spriva and another 2 pffs 12 hours later of symbicort only Plan B = Backup Only use your albuterol as a rescue medication to be used if you can't catch your breath by resting or doing a relaxed purse lip breathing pattern.  - The less you use it, the better it will work when you need it. - Ok to use the inhaler up to 2 puffs  every 4 hours if you must but call for appointment if use goes up over your usual need - Don't leave home without it !!  (think of it like the spare tire for your car)  Plan C = Crisis - only use your albuterol nebulizer if you first try Plan B and it fails to help > ok to use the nebulizer up to every 4 hours but if start needing it regularly call for immediate appointment If breathing / wheezing coughing getting worse > Prednisone 10 mg take  4 each am x 2 days,   2 each am x 2 days,  1 each am x 2 days and stop  Try to walk daily if possible to let me know if you are losing any ground      07/02/2016  f/u ov/Wert re:  Copd III symb 160 no lama and saba sev times a day at most  Chief Complaint  Patient presents with  . Follow-up    COPD, pt reports  he is doing again,  better since he quit smoking  treadmlll 2 x week x 15 min s stopping 2 mph/ flat  Rest x 10 min then does another 15 mn rec Plan A = Automatic = symbicort 160 Take 2 puffs first thing in am and then another 2 puffs about 12 hours later.  Plan B = Backup Only use your albuterol as a rescue medication to be used Plan C = Crisis - only use your albuterol nebulizer if you first try Plan B and it fails to help > ok to use the nebulizer up to every 4 hours but if start needing it regularly call for immediate appointment Plan D = Delatasone - if B and C not adequate> Prednisone 10 mg take  4 each am x 2 days,   2 each am x 2 days,  1 each am x 2 days and stop      12/30/2016  f/u ov/Wert re:  GOLD III copd/ resumed smoking due to leg pain from etoh neuropathy  Chief Complaint  Patient presents with  . Follow-up    Pt states started having swelling in his legs and neuropathy since April 2018- started smoking shortly after this started and now his breathing is worse.  He notices wheezing at night. He is using albuterol inhaler 2-3 x per day, but has not needed neb.   has used pred several times for worse breathing and helps - started back on 40 mg again on day of ov  Congested cough but mostly mucoid, esp in am Feels was doing better on spiriva   Feels leg swelling worse on gabapentin  No obvious day to day or daytime variability or assoc  purulent sputum or mucus plugs or hemoptysis or cp or chest tightness, or  overt sinus or hb symptoms. No unusual exp hx or h/o childhood pna/ asthma or knowledge of premature birth.   . Also denies any obvious fluctuation of symptoms with weather or environmental changes or other aggravating or alleviating factors except as outlined above   Current Medications, Allergies, Complete Past Medical History, Past Surgical History, Family History, and Social History were reviewed in [employer] record.  ROS  The following are not active  complaints unless bolded sore throat, dysphagia, dental problems, itching, sneezing,  nasal congestion or excess/ purulent secretions, ear ache,   fever, chills, sweats, unintended wt loss, classically pleuritic or exertional cp,  orthopnea pnd or leg swelling, presyncope, palpitations, abdominal pain, anorexia, nausea, vomiting, diarrhea  or change in bowel or bladder habits, change in stools or urine, dysuria,hematuria,  rash, arthralgias, visual complaints, headache, numbness, weakness or ataxia or problems with walking or coordination,  change in mood/affect or memory.                       Objective:   Physical Exam  01/18/2014       179 > 02/07/2015 190 > 03/21/2015   192 > 06/23/2015  196 >   12/30/2015 198 > 03/31/2016 198 >  07/02/2016  201 > 12/30/2016 201     02/09/13 177 lb (80.287 kg)  12/27/12 173 lb 12.8 oz (78.835 kg)    Elderly amb wm nad   Vital signs reviewed  - Note on arrival 02 sats  95% on RA - bp 140/90    HEENT: nl   turbinates bilaterally, and oropharynx. Nl external ear canals without cough reflex - poor dentition lower / none remaining upper  NECK :  without JVD/Nodes/TM/ nl carotid upstrokes bilaterally   LUNGS: no acc muscle use,  Nl contour chest with insp and exp rhonchi bilaterally    CV:  RRR  no s3 or murmur or increase in P2, and  1+ pitting bilateral lower ext sym pitting edema   ABD:  soft and nontender with nl inspiratory excursion in the supine position. No bruits or organomegaly appreciated, bowel sounds nl  MS:  Nl gait/ ext warm without deformities, calf tenderness, cyanosis or clubbing No obvious joint restrictions   SKIN: warm and dry without lesions    NEURO:  alert, approp, nl sensorium with  no motor or cerebellar deficits apparent.       I personally reviewed images and agree with radiology impression as follows:  CXR:   09/28/16 Hyperexpansion with underlying chronic interstitial changes. No acute cardiopulmonary findings.     Assessment & Plan:

## 2016-12-30 NOTE — Assessment & Plan Note (Signed)

## 2016-12-30 NOTE — Patient Instructions (Signed)
Add back spiriva 2.5 x 2 pffs each am   The key is to stop smoking completely before smoking completely stops you!   Please schedule a follow up visit in 3 months but call sooner if needed  with all medications /inhalers/ solutions in hand so we can verify exactly what you are taking. This includes all medications from all doctors and over the counters

## 2016-12-30 NOTE — Assessment & Plan Note (Signed)
-   02/09/2013  FEV1 2.14 (68%) ratio 60 p 30% better from B2 and dloc 52 corrects to 69 - 02/07/2015  extensive coaching HFA effectiveness =    90% from a baseline of 75%  - 02/07/2015  Walked RA x 3 laps @ 185 ft each stopped due to  End of study, nl pace, no sob or desat   - PFT's  03/21/2015  FEV1 1.92 (62 % ) ratio 60  p 14 % improvement from saba with DLCO  47 % corrects to 62 % for alv volume   - trial of spiriva respimat 03/21/2015  With goal to be able to walk up inclines > no better 06/23/2015 > try bevespi 2 bid x 3 weeks  -sob   Worse p 3 weeks bevespi  so resumed symb/spirva 06/25/2015  - quite smoking 07/2015> e cigs - referred to rehab 09/22/2015 >>> d/c'd p 19 sessions on 01/22/16 due to costs  - 03/31/2016 rec pred x 6 days if needed  - 12/30/2016  After extensive coaching HFA effectiveness =    90% with hfa/smi > restart spiriva 2.5 x 2 each am  Worse cough /congestion / wheeze likely due to active smoking / no evidence of chf   I had an extended discussion with the patient reviewing all relevant studies completed to date and  lasting 15 to 20 minutes of a 25 minute visit    Each maintenance medication was reviewed in detail including most importantly the difference between maintenance and prns and under what circumstances the prns are to be triggered using an action plan format that is not reflected in the computer generated alphabetically organized AVS.    Please see AVS for specific instructions unique to this visit that I personally wrote and verbalized to the the pt in detail and then reviewed with pt  by my nurse highlighting any  changes in therapy recommended at today's visit to their plan of care.

## 2016-12-30 NOTE — Assessment & Plan Note (Signed)
Echo 12/29/16 with AR but no evidence PH or chf so could be due to gabapentin > try off for a week to see and f/u with neuro as planned

## 2016-12-31 ENCOUNTER — Ambulatory Visit: Payer: Medicare Other | Admitting: Internal Medicine

## 2017-01-17 ENCOUNTER — Ambulatory Visit (INDEPENDENT_AMBULATORY_CARE_PROVIDER_SITE_OTHER): Payer: Medicare Other | Admitting: Cardiology

## 2017-01-17 ENCOUNTER — Encounter: Payer: Self-pay | Admitting: Cardiology

## 2017-01-17 VITALS — BP 142/77 | HR 81 | Ht 70.5 in | Wt 200.0 lb

## 2017-01-17 DIAGNOSIS — R0989 Other specified symptoms and signs involving the circulatory and respiratory systems: Secondary | ICD-10-CM

## 2017-01-17 DIAGNOSIS — F17210 Nicotine dependence, cigarettes, uncomplicated: Secondary | ICD-10-CM | POA: Diagnosis not present

## 2017-01-17 DIAGNOSIS — I351 Nonrheumatic aortic (valve) insufficiency: Secondary | ICD-10-CM | POA: Insufficient documentation

## 2017-01-17 DIAGNOSIS — M7989 Other specified soft tissue disorders: Secondary | ICD-10-CM

## 2017-01-17 NOTE — Patient Instructions (Addendum)
SCHEDULE AT [facility] has requested that you have an ankle brachial index (ABI). During this test an ultrasound and blood pressure cuff are used to evaluate the arteries that supply the arms and legs with blood. Allow thirty minutes for this exam. There are no restrictions or special instructions.  Your physician has requested that you have a lower  extremity arterial duplex. This test is an ultrasound of the arteries in the legs. It looks at arterial blood flow in the legs. Allow one hour for Lower Arterial scans. There are no restrictions or special instructions   Your physician recommends that you schedule a follow-up appointment in [place].

## 2017-01-17 NOTE — Progress Notes (Signed)
PCP: Joel Contras, MD   Dr. Melvyn Perez. - Pulmonary Med   Clinic Note: Chief Complaint  Patient presents with  . New Patient (Initial Visit)    swelling in legs , abnormal echo, COPD    HPI: Joel Perez is a 71 y.o. male who is being seen today for the evaluation of Diastolic dysfunction and aortic valve disease on echo at the request of Joel Contras, MD &  - He is followed by Dr. Melvyn Perez from pulmonary medicine for COPD. He intermittently takes prednisone for COPD. - He was referred to [facility] - motion the venous Dopplers apparently did not show significant reflux. The recommendation was to check 2-D echocardiogram and consider compression stockings.  They also recommended checking ABIs, but they were not done.  Recent Hospitalizations:   [facility] 09[facility]2016 for edema. - Noted 2-3 weeks of motion edema. Also noted neuropathic pain in his feet.  Studies Personally Reviewed - (if available, images/films reviewed: From Epic Chart or Care Everywhere)  2 D Echo 12/29/2016: EF 60-65%. Moderate LVH. Mild aortic valve calcification. Mild to moderate regurgitation with no stenosis. Mitral valve annular calcification with no comment of prolapse or regurgitation.  Interval History: Joel [facility] presents here today is for the concern for lower extremity swelling and discomfort. He notes tight swelling in both legs with purple discoloration. His legs ache and hurt, worse at night. He notes occasional orthopnea symptoms, indicating that he has to sleep on one side versus the other.  As such, he denies any real PND symptoms. He denies any pressure with rest or exertion, but does have exertional dyspnea.  No palpitations, lightheadedness, dizziness, weakness or syncope/near syncope. No TIA/amaurosis fugax symptoms.  He is not the greatest historians, as well as difficult to if he truly has symptoms of claudication in his legs. He does have discomfort with walking but also at  rest.  ROS: A comprehensive was performed. Review of Systems  Constitutional: Negative for chills, fever and malaise/fatigue (Just doesn't do much. Because his feet hurt).  HENT: Negative for congestion and nosebleeds.   Respiratory: Positive for cough (Chronic cough from COPD), shortness of breath (Baseline dyspnea. No recent exacerbation) and wheezing (Chronic.). Negative for sputum production.   Cardiovascular:       Per history of present illness  Gastrointestinal: Negative for abdominal pain, heartburn and melena.  Genitourinary: Negative for hematuria.  Musculoskeletal: Positive for joint pain (Ankles and knees). Negative for falls.  Neurological: Positive for sensory change (Reduced sensation in feet).       Sharp burning pain in feet and lower legs  Psychiatric/Behavioral: The patient has insomnia (Heart time sleeping because his feet hurt).   All other systems reviewed and are negative.   I have reviewed and (if needed) personally updated the patient's problem list, medications, allergies, past medical and surgical history, social and family history.   Past Medical History:  Diagnosis Date  . 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 BPH (benign prostatic hyperplasia)   . Capsular cataract of left eye   . COPD (chronic obstructive pulmonary disease) (HCC)    Dr. Melvyn Perez  . Current smoker    Long-term smoker. Not interested in quitting  . Diverticulosis    With intermittent diverticulitis  . Erectile dysfunction   . Essential hypertension    Several recorded blood pressures greater than 140/90.  . H/O mitral valve prolapse    By report, but not confirmed by echo.  Marland Kitchen  Low back pain    Chronic. Followed by Dr. Suella Broad  . Mild aortic regurgitation 12/2013   Mild to moderate aortic regurgitation on echo  . Neuropathy     Past Surgical History:  Procedure Laterality Date  . COLON SURGERY  2000  . ELBOW SURGERY    . TRANSTHORACIC ECHOCARDIOGRAM   12/29/2016   EF 60-65%. Moderate LVH. Mild aortic valve calcification. Mild to moderate regurgitation with no stenosis. Mitral valve annular calcification with no comment of prolapse or regurgitation.    Current Meds  Medication Sig  . albuterol (PROAIR HFA) 108 (90 Base) MCG/ACT inhaler INHALE 2 PUFFS INTO THE LUNGS EVERY 6 HOURS AS NEEDED FOR WHEEZING OR SHORTNESS OF BREATH  . albuterol (PROVENTIL) (2.5 MG/3ML) 0.083% nebulizer solution Take 3 mLs (2.5 mg total) by nebulization every 6 (six) hours as needed for wheezing or shortness of breath.  Marland Kitchen aspirin 81 MG tablet Take 81 mg by mouth daily.  Marland Kitchen b complex vitamins tablet Take 1 tablet by mouth daily.  . budesonide-formoterol (SYMBICORT) 160-4.5 MCG/ACT inhaler Inhale 2 puffs into the lungs 2 (two) times daily.  . Cyclobenzaprine HCl (FLEXERIL PO) Take 1 tablet by mouth 3 (three) times daily as needed.  . gabapentin (NEURONTIN) 300 MG capsule Take 1 tablet at bedtime x 1 week, then increase to 1 tablet twice daily.  . Multiple Vitamin (MULTIVITAMIN) capsule Take 1 capsule by mouth daily.  Marland Kitchen oxyCODONE-acetaminophen (PERCOCET/ROXICET) 5-325 MG per tablet Take 1 tablet by mouth every 6 (six) hours as needed.  . predniSONE (DELTASONE) 10 MG tablet Take  4 each am x 2 days,   2 each am x 2 days,  1 each am x 2 days and stop  . Sildenafil Citrate (VIAGRA PO) Take 1 tablet by mouth as needed.  . sodium chloride (OCEAN) 0.65 % nasal spray Place 1 spray into the nose as needed for congestion.  Marland Kitchen tiotropium (SPIRIVA) 18 MCG inhalation capsule Place 18 mcg into inhaler and inhale daily.  . Tiotropium Bromide Monohydrate (SPIRIVA RESPIMAT) 2.5 MCG/ACT AERS Inhale 2 puffs into the lungs daily.    Allergies  Allergen Reactions  . Sulfa Antibiotics Hives    Social History   Social History  . Marital status: Married    Spouse name: N/A  . Number of children: N/A  . Years of education: N/A   Occupational History  . [employer]      Social History Main Topics  . Smoking status: Current Every Day Smoker    Packs/day: 2.00    Years: 50.00    Types: Cigarettes  . Smokeless tobacco: Never Used     Comment: 1/2 ppd 12/30/16-lmr  . Alcohol use 0.0 oz/week     Comment: 2 beers a day "couple beers"  . Drug use: No  . Sexual activity: Not Asked   Other Topics Concern  . None   Social History Narrative   Retired Animal nutritionist.   He lives with wife.    Highest level of education:  Trade school    family history includes Brain cancer in his brother; Breast cancer in his mother; Heart disease in his father; Other in his sister.  Wt Readings from Last 3 Encounters:  01/17/17 200 lb (90.7 kg)  12/30/16 201 lb 12.8 oz (91.5 kg)  07/16/16 197 lb (89.4 kg)    PHYSICAL EXAM BP (!) 142/77 (BP Location: Left Arm, Patient Position: Sitting, Cuff Size: Normal)   Pulse 81   Ht 5' 10.5" (  1.791 m)   Wt 200 lb (90.7 kg)   BMI 28.29 kg/m  Physical Exam  Constitutional: He is oriented to person, place, and time. He appears well-developed and well-nourished. No distress.  Well-groomed.  HENT:  Head: Normocephalic and atraumatic.  Mouth/Throat: No oropharyngeal exudate.  Very poor dentition  Eyes: Conjunctivae are normal. No scleral icterus.  Blind in the right eye. Left eye EOM normal. Pupil reactivity is difficult to assess with cataract.  Neck: Normal range of motion. Neck supple. No hepatojugular reflux and no JVD present. Carotid bruit is not present.  Cardiovascular: Normal rate and regular rhythm.   Occasional extrasystoles are present. PMI is not displaced.  Exam reveals decreased pulses.   Murmur heard.  Low-pitched blowing decrescendo early systolic murmur is present with a grade of 1/6  at the upper left sternal border Pulses:      Radial pulses are 2+ on the right side, and 2+ on the left side.       Femoral pulses are 2+ on the right side, and 2+ on the left side.      Popliteal pulses are 1+ on the right  side, and 1+ on the left side.       Dorsalis pedis pulses are 0 on the right side, and 0 on the left side.       Posterior tibial pulses are 1+ on the right side, and 1+ on the left side.  Pulmonary/Chest: Accessory muscle usage (At baseline) present. No respiratory distress. He has decreased breath sounds (Globally. Increased AP diameter, prolonged expiratory phase). He has wheezes (Diffuse/scattered) in the right middle field, the right lower field, the left middle field and the left lower field. He has no rales. He exhibits no tenderness.  Baseline except for muscle use  Abdominal: Soft. Bowel sounds are normal. He exhibits no distension. There is no tenderness. There is no rebound and no guarding.  Mild truncal obesity  Musculoskeletal: Normal range of motion. He exhibits edema (1-2+ tense bilateral edema from knees down.).  Bilateral legs from knees down have probable violaceous discoloration with tense edema. Minimally palpable pedal pulses. There is evidence of capillary refill. Legs are very sensitive and painful to touch. No obvious wounds noted  Neurological: He is alert and oriented to person, place, and time. No cranial nerve deficit.  Psychiatric: He has a normal mood and affect. His behavior is normal. Judgment and thought content normal.  Nursing note and vitals reviewed.    Adult ECG Report  Rate: 81 ;  Rhythm: normal sinus rhythm and 1 AVB. Cannot rule out anterior MI, age undetermined versus interventricular conduction delay. Also noted  RSR';   Narrative Interpretation: No prior EKG  Other studies Reviewed: Additional studies/ records that were reviewed today include:  Recent Labs:    Per PCPs note in April 2018: Last labs 02/20/2016 - LDL 151  April 2018: TC 211, TG 180, HDL 40, LDL 134.  BUN/creatinine 13/0.7. Potassium 4.2. Normal LFTs  ASSESSMENT / PLAN: Problem List Items Addressed This Visit    Cigarette smoker    This is been discussed with him from pulmonary  medicine and PCP standpoint. Pending results of lower extremity arterial Dopplers we should be able to add one more adverse effect.  He indicates that he will "try" to quit, but the cancer did not seem very committed      Decreased pedal pulses    He does have bilateral leg swelling, but has very painful and sore tender  legs with decreased pulses concerning for significant below the knee peripheral vascular disease.  Plan: Check ABIs/LE Dopplers. -> If abnormal, would refer to vascular cardiologist (Dr. Fletcher Anon, Dr. and/or Dr. Gwenlyn Found) in this office.  Would be reluctant to recommend support stockings until we see results of the study.      Relevant Orders   EKG 12-Lead (Completed)   Leg swelling    I don't think his leg swelling is related to left-sided heart failure - no significant systolic or diastolic dysfunction noted. There was no comment on right-sided pressures - could be related to RV failure from COPD, unfortunately, with poor quality echocardiogram, we could not interpret accurately assess pulmonary arterial pressures.  For now, would recommend blood pressure control and consider diuretic. I think he could also be related to PAD and therefore pursuing PAD evaluation.      Relevant Orders   EKG 12-Lead (Completed)   Mild aortic valve regurgitation - Primary    I don't think mild to moderate aortic regurgitation has much to do with his blood edema. I think he may have some then do with diastolic dysfunction if reported and may be somewhat related to dyspnea. The mainstay treatment for aortic regurgitation his blood pressure control. Her pressure continues to be poorly controlled at this point. We will see what his follow-up shows and then likely consider treating.      Relevant Orders   EKG 12-Lead (Completed)      Current medicines are reviewed at length with the patient today. (+/- concerns) None The following changes have been made: None  Patient Instructions    SCHEDU[facility] AT [facility] has requested that you have an ankle brachial index (ABI). During this test an ultrasound and blood pressure cuff are used to evaluate the arteries that supply the arms and legs with blood. Allow thirty minutes for this exam. There are no restrictions or special instructions.  Your physician has requested that you have a lower  extremity arterial duplex. This test is an ultrasound of the arteries in the legs. It looks at arterial blood flow in the legs. Allow one hour for Lower Arterial scans. There are no restrictions or special instructions   Your physician recommends that you schedule a follow-up appointment in [place].    Studies Ordered:   Orders Placed This Encounter  Procedures  . EKG 12-Lead      Glenetta Hew, M.D., M.S. Interventional Cardiologist   Pager # [phone] Phone # [phone] [address] [place], [place] [address]

## 2017-01-19 ENCOUNTER — Other Ambulatory Visit: Payer: Self-pay | Admitting: Cardiology

## 2017-01-19 DIAGNOSIS — R0989 Other specified symptoms and signs involving the circulatory and respiratory systems: Secondary | ICD-10-CM

## 2017-01-19 DIAGNOSIS — M7989 Other specified soft tissue disorders: Secondary | ICD-10-CM

## 2017-01-24 ENCOUNTER — Encounter: Payer: Self-pay | Admitting: Cardiology

## 2017-01-24 NOTE — Assessment & Plan Note (Signed)
I don't think mild to moderate aortic regurgitation has much to do with his blood edema. I think he may have some then do with diastolic dysfunction if reported and may be somewhat related to dyspnea. The mainstay treatment for aortic regurgitation his blood pressure control. Her pressure continues to be poorly controlled at this point. We will see what his follow-up shows and then likely consider treating.

## 2017-01-24 NOTE — Assessment & Plan Note (Signed)
I don't think his leg swelling is related to left-sided heart failure - no significant systolic or diastolic dysfunction noted. There was no comment on right-sided pressures - could be related to RV failure from COPD, unfortunately, with poor quality echocardiogram, we could not interpret accurately assess pulmonary arterial pressures.  For now, would recommend blood pressure control and consider diuretic. I think he could also be related to PAD and therefore pursuing PAD evaluation.

## 2017-01-24 NOTE — Assessment & Plan Note (Signed)
He does have bilateral leg swelling, but has very painful and sore tender legs with decreased pulses concerning for significant below the knee peripheral vascular disease.  Plan: Check ABIs/LE Dopplers. -> If abnormal, would refer to vascular cardiologist (Dr. Fletcher Anon, Dr. and/or Dr. Gwenlyn Found) in this office.  Would be reluctant to recommend support stockings until we see results of the study.

## 2017-01-24 NOTE — Assessment & Plan Note (Signed)
This is been discussed with him from pulmonary medicine and PCP standpoint. Pending results of lower extremity arterial Dopplers we should be able to add one more adverse effect.  He indicates that he will "try" to quit, but the cancer did not seem very committed

## 2017-01-25 ENCOUNTER — Ambulatory Visit (HOSPITAL_COMMUNITY)
Admission: RE | Admit: 2017-01-25 | Discharge: 2017-01-25 | Disposition: A | Discharge status: Home / Self Care | Payer: Medicare Other | Source: Ambulatory Visit | Attending: Cardiovascular Disease | Admitting: Cardiovascular Disease

## 2017-01-25 DIAGNOSIS — Z720 Tobacco use: Secondary | ICD-10-CM | POA: Insufficient documentation

## 2017-01-25 DIAGNOSIS — R938 Abnormal findings on diagnostic imaging of other specified body structures: Secondary | ICD-10-CM | POA: Insufficient documentation

## 2017-01-25 DIAGNOSIS — R0989 Other specified symptoms and signs involving the circulatory and respiratory systems: Secondary | ICD-10-CM | POA: Insufficient documentation

## 2017-01-25 DIAGNOSIS — I708 Atherosclerosis of other arteries: Secondary | ICD-10-CM | POA: Insufficient documentation

## 2017-01-25 DIAGNOSIS — I10 Essential (primary) hypertension: Secondary | ICD-10-CM | POA: Diagnosis not present

## 2017-01-25 DIAGNOSIS — I700 Atherosclerosis of aorta: Secondary | ICD-10-CM | POA: Insufficient documentation

## 2017-01-25 DIAGNOSIS — J449 Chronic obstructive pulmonary disease, unspecified: Secondary | ICD-10-CM | POA: Insufficient documentation

## 2017-01-25 DIAGNOSIS — S91301A Unspecified open wound, right foot, initial encounter: Secondary | ICD-10-CM | POA: Diagnosis not present

## 2017-01-25 DIAGNOSIS — M7989 Other specified soft tissue disorders: Secondary | ICD-10-CM | POA: Diagnosis not present

## 2017-02-08 ENCOUNTER — Encounter: Payer: Self-pay | Admitting: Cardiovascular Disease

## 2017-02-08 ENCOUNTER — Ambulatory Visit (INDEPENDENT_AMBULATORY_CARE_PROVIDER_SITE_OTHER): Payer: Medicare Other | Admitting: Cardiovascular Disease

## 2017-02-08 VITALS — BP 126/82 | HR 76 | Ht 70.5 in | Wt 201.4 lb

## 2017-02-08 DIAGNOSIS — E785 Hyperlipidemia, unspecified: Secondary | ICD-10-CM | POA: Diagnosis not present

## 2017-02-08 DIAGNOSIS — I739 Peripheral vascular disease, unspecified: Secondary | ICD-10-CM

## 2017-02-08 DIAGNOSIS — I872 Venous insufficiency (chronic) (peripheral): Secondary | ICD-10-CM

## 2017-02-08 DIAGNOSIS — Z720 Tobacco use: Secondary | ICD-10-CM | POA: Diagnosis not present

## 2017-02-08 MED ORDER — ATORVASTATIN CALCIUM 40 MG PO TABS: 40.0000 mg | ORAL_TABLET | Freq: Every day | ORAL | 3 refills | Status: DC

## 2017-02-08 NOTE — Progress Notes (Signed)
Cardiology Office Note   Date:  02/08/2017   ID:  [facility], DOB 06-Mar-1946, MRN [id number]  PCP:  [name], MD  Cardiologist:  Dr. Ellyn Hack Chief Complaint  Patient presents with  . Follow-up    Lower Extr doppler      History of Present Illness: [facility] is a 71 y.o. male who was referred by Dr. Ellyn Hack for evaluation of peripheral arterial disease. He has known history of chronic diastolic heart failure, tobacco use, aortic valve disease and COPD. Echocardiogram showed normal LV systolic function with moderate LVH, mild to moderate aortic regurgitation with no significant stenosis. He is known to have leg edema thought to be due to chronic venous insufficiency. Venous Doppler was unremarkable. He complains of mild bilateral calf pain at rest and with walking with some numbness. He thinks it's due to neuropathy. He also has dark discoloration of both feet which started around the same time when he started having swelling in his legs. The swelling became more noticeable around April of this year. He does have a superficial wound on the right foot with a scap likely venous wound .  He underwent lower extremity arterial Doppler which showed normal ABI bilaterally. Duplex showed moderate bilateral iliac disease and moderate bilateral common femoral artery disease with mild SFA disease and three-vessel runoff bilaterally.   Past Medical History:  Diagnosis Date  . 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 BPH (benign prostatic hyperplasia)   . Capsular cataract of left eye   . COPD (chronic obstructive pulmonary disease) (HCC)    Dr. [name]  . Current smoker    Long-term smoker. Not interested in quitting  . Diverticulosis    With intermittent diverticulitis  . Erectile dysfunction   . Essential hypertension    Several recorded blood pressures greater than 140/90.  . H/O mitral valve prolapse    By report, but not confirmed by echo.  . Low back pain    Chronic. Followed by Dr. Suella Broad  . Mild aortic regurgitation 12/2013   Mild to moderate aortic regurgitation on echo  . Neuropathy     Past Surgical History:  Procedure Laterality Date  . COLON SURGERY  2000  . ELBOW SURGERY    . TRANSTHORACIC ECHOCARDIOGRAM  12/29/2016   EF 60-65%. Moderate LVH. Mild aortic valve calcification. Mild to moderate regurgitation with no stenosis. Mitral valve annular calcification with no comment of prolapse or regurgitation.     Current Outpatient Prescriptions  Medication Sig Dispense Refill  . albuterol (PROAIR HFA) 108 (90 Base) MCG/ACT inhaler INHALE 2 PUFFS INTO THE LUNGS EVERY 6 HOURS AS NEEDED FOR WHEEZING OR SHORTNESS OF BREATH 1 each 5  . albuterol (PROVENTIL) (2.5 MG/3ML) 0.083% nebulizer solution Take 3 mLs (2.5 mg total) by nebulization every 6 (six) hours as needed for wheezing or shortness of breath. 75 mL 11  . aspirin 81 MG tablet Take 81 mg by mouth daily.    Marland Kitchen b complex vitamins tablet Take 1 tablet by mouth daily.    . budesonide-formoterol (SYMBICORT) 160-4.5 MCG/ACT inhaler Inhale 2 puffs into the lungs 2 (two) times daily. 1 Inhaler 11  . Cyclobenzaprine HCl (FLEXERIL PO) Take 1 tablet by mouth 3 (three) times daily as needed.    . gabapentin (NEURONTIN) 300 MG capsule Take 1 tablet at bedtime x 1 week, then increase to 1 tablet twice daily. 60 capsule 5  . Multiple Vitamin (MULTIVITAMIN) capsule Take 1  capsule by mouth daily.    Marland Kitchen oxyCODONE-acetaminophen (PERCOCET/ROXICET) 5-325 MG per tablet Take 1 tablet by mouth every 6 (six) hours as needed.    . predniSONE (DELTASONE) 10 MG tablet Take  4 each am x 2 days,   2 each am x 2 days,  1 each am x 2 days and stop 14 tablet 11  . Sildenafil Citrate (VIAGRA PO) Take 1 tablet by mouth as needed.    . sodium chloride (OCEAN) 0.65 % nasal spray Place 1 spray into the nose as needed for congestion.    Marland Kitchen tiotropium (SPIRIVA) 18 MCG inhalation capsule Place 18 mcg into inhaler and  inhale daily.    . Tiotropium Bromide Monohydrate (SPIRIVA RESPIMAT) 2.5 MCG/ACT AERS Inhale 2 puffs into the lungs daily. 1 Inhaler 11   No current facility-administered medications for this visit.     Allergies:   Sulfa antibiotics    Social History:  The patient  reports that he has been smoking Cigarettes.  He has a 100.00 pack-year smoking history. He has never used smokeless tobacco. He reports that he drinks alcohol. He reports that he does not use drugs.   Family History:  The patient's family history includes Brain cancer in his brother; Breast cancer in his mother; Heart disease in his father; Other in his sister.    ROS:  Please see the history of present illness.   Otherwise, review of systems are positive for none.   All other systems are reviewed and negative.    PHYSICAL EXAM: VS:  BP 126/82   Pulse 76   Ht 5' 10.5" (1.791 m)   Wt 201 lb 6.4 oz (91.4 kg)   BMI 28.49 kg/m  , BMI Body mass index is 28.49 kg/m. GEN: Well nourished, well developed, in no acute distress  HEENT: normal  Neck: no JVD, carotid bruits, or masses Cardiac: RRR; no  rubs, or gallops. There is one out of 6 systolic ejection murmur in the aortic area. There is mild to moderate bilateral leg edema Respiratory:  clear to auscultation bilaterally, normal work of breathing GI: soft, nontender, nondistended, + BS MS: no deformity or atrophy  Skin: warm and dry, no rash Neuro:  Strength and sensation are intact Psych: euthymic mood, full affect Vascular: Femoral pulses are normal bilaterally. Dorsalis pedis and posterior tibial is palpable on both sides.  EKG:  EKG is not ordered today.    Recent Labs: No results found for requested labs within last 8760 hours.    Lipid Panel No results found for: CHOL, TRIG, HDL, CHOLHDL, VLDL, LDLCALC, LDLDIRECT    Wt Readings from Last 3 Encounters:  02/08/17 201 lb 6.4 oz (91.4 kg)  01/17/17 200 lb (90.7 kg)  12/30/16 201 lb 12.8 oz (91.5 kg)        PAD Screen 01/17/2017  Previous surgical procedure? Yes  Dates of procedures colon ressection  Pain with walking? Yes  Subsides with rest? Yes  Painful, non-healing ulcers? Yes  Extremities discolored? Yes      ASSESSMENT AND PLAN:  1.  Peripheral arterial disease:The patient has mild to moderate diffuse peripheral arterial disease with no evidence of obstructive stenosis. His leg pulses are palpable by physical exam and his ABI was normal. He does not appear to be symptomatic from this at the present time. I discussed with him the natural history of peripheral arterial disease. I recommend continuing aspirin 81 mg once daily, smoking cessation and treatment of risk factors.  2. Tobacco  use: I discussed with him the importance of smoking cessation especially in the presence of peripheral arterial disease.  3. Hyperlipidemia: Given his diagnosis of peripheral arterial disease, treatment with a statin is indicated. I started atorvastatin 40 mg daily. Check lipid and liver profile in 6 weeks.  4. Chronic venous insufficiency: I suspect that this is the likely culprit for his leg edema and some of his leg pain. I advised him to start using knee-high support stockings. Given that his PAD is not obstructive, support stockings are not contraindicated. I also advised him to elevate his legs during the day.    Disposition:   FU with me in 3 months  Signed,  Kathlyn Sacramento, MD  02/08/2017 8:53 AM    [place]

## 2017-02-08 NOTE — Patient Instructions (Signed)
Medication Instructions:   START ATORVASTATIN 40 MG ONCE DAILY  Labwork:  Your physician recommends that you return for lab work in: 6 WEEKS= DO NOT EAT PRIOR TO LAB WORK  Follow-Up:  Your physician recommends that you schedule a follow-up appointment in: 3 MONTHS WITH DR Manchester AT BEDTIME

## 2017-02-10 ENCOUNTER — Ambulatory Visit: Payer: Medicare Other | Admitting: Cardiology

## 2017-02-11 ENCOUNTER — Ambulatory Visit: Payer: Medicare Other | Admitting: Cardiology

## 2017-02-22 ENCOUNTER — Telehealth: Payer: Self-pay | Admitting: Internal Medicine

## 2017-02-22 MED ORDER — BUDESONIDE-FORMOTEROL FUMARATE 160-4.5 MCG/ACT IN AERO: 2.0000 | INHALATION_SPRAY | Freq: Two times a day (BID) | RESPIRATORY_TRACT | 6 refills | Status: DC

## 2017-02-22 NOTE — Telephone Encounter (Signed)
Rx for Symbicort sent to [facility] on [address]. Pt notified and nothing further is needed.

## 2017-03-11 ENCOUNTER — Other Ambulatory Visit: Payer: Self-pay | Admitting: Neurology

## 2017-04-01 ENCOUNTER — Encounter: Payer: Self-pay | Admitting: Internal Medicine

## 2017-04-01 ENCOUNTER — Ambulatory Visit: Payer: Medicare Other | Admitting: Internal Medicine

## 2017-04-01 VITALS — BP 124/82 | HR 86 | Ht 70.5 in | Wt 195.0 lb

## 2017-04-01 DIAGNOSIS — J449 Chronic obstructive pulmonary disease, unspecified: Secondary | ICD-10-CM

## 2017-04-01 DIAGNOSIS — F17210 Nicotine dependence, cigarettes, uncomplicated: Secondary | ICD-10-CM | POA: Diagnosis not present

## 2017-04-01 MED ORDER — GLYCOPYRROLATE-FORMOTEROL 9-4.8 MCG/ACT IN AERO: 2.0000 | INHALATION_SPRAY | Freq: Two times a day (BID) | RESPIRATORY_TRACT | 0 refills | Status: DC

## 2017-04-01 MED ORDER — PREDNISONE 10 MG PO TABS: ORAL_TABLET | ORAL | 11 refills | Status: DC

## 2017-04-01 MED ORDER — GLYCOPYRROLATE-FORMOTEROL 9-4.8 MCG/ACT IN AERO: 2.0000 | INHALATION_SPRAY | Freq: Two times a day (BID) | RESPIRATORY_TRACT | 11 refills | Status: DC

## 2017-04-01 NOTE — Progress Notes (Signed)
Subjective:    Patient ID: Joel Perez, male    DOB: December 31, 1945   MRN: [id number]    Brief patient profile:   19 yowm active smoker referred by Dr Moreen Fowler for eval of copd with GOLD II criteria established 02/09/13    History of Present Illness  12/27/2012 1st pulmonary eval/ Wert cc onset x 2009 am cough cough/ congestion and progresive doe with white mucus x sev tbsp x sev min better p proaire and maintained on spiriva which he feels doesn't work as well as advair. Doe x one flight steps, dragging the garbage to street stops x 2. He denies cough to me but actually has a rattling with cough maneuver but doesn't bring up much mucus, what he does bring up is mostly in ams and mucoid and some better since quit smoking  rec symbicort 160 Take 2 puffs first thing in am and then another 2 puffs about 12 hours later.  Only use your albuterol (proaire) as a rescue medication to be used if you can't catch your breath by resting or doing a relaxed purse lip breathing pattern. The less you use it, the better it will work when you need it.  As you improve ok to stop spiriva  Please schedule a follow up office visit in 6 weeks, call sooner if needed with pft's   02/09/2013 f/u ov/Wert re: GOLD II with reversibilty Chief Complaint  Patient presents with  . Follow-up    W/ PFT. Pt reports his breathing is better. He gets up tan color phlem up right after breathing tx  still rattling some but much better.  rec Continue symbicort 160 Take 2 puffs first thing in am and then another 2 puffs about 12 hours later.  Only use your proair as a rescue medication to be used if you can't catch your breath by resting or doing a relaxed purse lip breathing pattern. The less you use it, the better it will work when you need it.  Don't leave home without it [place] to leave off spiriva     03/21/2015  f/u ov/Wert re: COPD III with reversibilty on symb 160 2bid / still smoking Chief Complaint  Patient presents with  .  Follow-up    PFT done today. Breathing has improved since the last visit. He is usin proair 1-2 x per wk on average  Wants to be able to walk up inclines s sob  rec Add spiriva respimat x 2 puffs each am only right after the symbicort 160  Only use your albuterol as rescue    06/23/2015  f/u ov/Wert re: copd GOLDIII with reversibility /  symb 160 2bid and spiriva daily   Chief Complaint  Patient presents with  . Follow-up    Breathing is unchanged. He uses rescue inhaler 1 x pr wk on average.   using albuterol for noisy breathing at hs / trying e cigs but still smoking reg cigs also Doe = MMRC1 = can walk nl pace, flat grade, can't hurry or go uphills or steps s sob    rec Bevespi Take 2 puffs first thing in am and then another 2 puffs about 12 hours later and stop symbicort and spiriva for now  Only use your albuterol as a rescue medication The key is to stop smoking completely before smoking completely stops you- think of the e cigs as a one way bridge off all tobacco products   09/22/2015  f/u ov/Wert re: GOLD III copd/ maint rx =  symbicort/ spiriva resp Chief Complaint  Patient presents with  . Follow-up    Stopped smoking 1 month ago. He states breathing has been worse x 2 months- using albuterol 2 x daily on average. He has also noticed some wheezing at night.   now has neb uses at hs helps more than hfa but confused as to how to use it with his other resp meds  Doe continues = MMRC1 = can walk nl pace, flat grade, can't hurry or go uphills or steps s sob   rec Prednisone 10 mg take  4 each am x 2 days,   2 each am x 2 days,  1 each am x 2 days and stop  Plan A = Automatic = Symbicort 160 2 pffs first thing followed by Anna Genre and another 2 pffs 12 hours later  Plan B = Backup Only use your albuterol  Plan C = Crisis - only use your albuterol nebulizer if you first try Plan B and it fails to help > ok to use the nebulizer up to every 4 hours but if start needing it regularly call  for immediate appointment We will be referring you to rehab at [facility]      03/31/2016  f/u ov/Wert re:  GOLD III/ still not smoking cigs/ some ecigs / today took symbicort 2 and spivirva (one puff to make it last)  Chief Complaint  Patient presents with  . Follow-up    Breathing is doing well. He states had to stop attending rehab due to insurance issues. He states he uses rescue inhaler nightly due to wheezing.    at end of rehab able to do 2 mph flat x 15 min but has not continued at home though he does have similar equipment  Using proair 1 hour after symbicort in pm " to break up my night congestion/wheezing" Not taking either symb or spiriva as rec at previous ov ? Due to cost ? Due to not processing instructions? rec Plan A = Automatic = Symbicort 160 2 pffs first thing in AM  followed by Spriva and another 2 pffs 12 hours later of symbicort only Plan B = Backup Only use your albuterol as a rescue medication to be used if you can't catch your breath by resting or doing a relaxed purse lip breathing pattern.  - The less you use it, the better it will work when you need it. - Ok to use the inhaler up to 2 puffs  every 4 hours if you must but call for appointment if use goes up over your usual need - Don't leave home without it !!  (think of it like the spare tire for your car)  Plan C = Crisis - only use your albuterol nebulizer if you first try Plan B and it fails to help > ok to use the nebulizer up to every 4 hours but if start needing it regularly call for immediate appointment If breathing / wheezing coughing getting worse > Prednisone 10 mg take  4 each am x 2 days,   2 each am x 2 days,  1 each am x 2 days and stop  Try to walk daily if possible to let me know if you are losing any ground      07/02/2016  f/u ov/Wert re:  Copd III symb 160 no lama and saba sev times a day at most  Chief Complaint  Patient presents with  . Follow-up    COPD, pt reports  he is doing again,  better since he quit smoking  treadmlll 2 x week x 15 min s stopping 2 mph/ flat  Rest x 10 min then does another 15 mn rec Plan A = Automatic = symbicort 160 Take 2 puffs first thing in am and then another 2 puffs about 12 hours later.  Plan B = Backup Only use your albuterol as a rescue medication to be used Plan C = Crisis - only use your albuterol nebulizer if you first try Plan B and it fails to help > ok to use the nebulizer up to every 4 hours but if start needing it regularly call for immediate appointment Plan D = Delatasone - if B and C not adequate> Prednisone 10 mg take  4 each am x 2 days,   2 each am x 2 days,  1 each am x 2 days and stop      12/30/2016  f/u ov/Wert re:  GOLD III copd/ resumed smoking due to leg pain from etoh neuropathy  Chief Complaint  Patient presents with  . Follow-up    Pt states started having swelling in his legs and neuropathy since April 2018- started smoking shortly after this started and now his breathing is worse.  He notices wheezing at night. He is using albuterol inhaler 2-3 x per day, but has not needed neb.   has used pred several times for worse breathing and helps - started back on 40 mg again on day of ov Congested cough but mostly mucoid, esp in am Feels was doing better on spiriva   Feels leg swelling worse on gabapentin Rec Add back spiriva 2.5 x 2 pffs each am  The key is to stop smoking completely before smoking completely stops you!  Please schedule a follow up visit in 3 months but call sooner if needed  with all medications /inhalers/ solutions in hand so we can verify exactly what you are taking. This includes all medications from all doctors and over the counters     04/01/2017  f/u ov/Wert re:  GOLD  III copd/ still smoking helps pain / did not bring meds or formulary alternatives  Chief Complaint  Patient presents with  . Follow-up    3 month follow up for COPD. States breathing has been about the same since last visit.  Has not refilled Spriva due to being in the donut hole. Still having SOB with activity.    using rescue saba 2-3 x per day Was using spiriva and improved  but too expensive on his plan and did not call with list of alternatives No need for neb at all  Doe = MMRC2 = can't walk a nl pace on a flat grade s sob but does fine slow and flat eg slow treadmill flat grade x 10 min No recent pred    No obvious patterns in  day to day or daytime variability or assoc excess/ purulent sputum or mucus plugs or hemoptysis or cp or chest tightness, subjective wheeze or overt sinus or hb symptoms. No unusual exp hx or h/o childhood pna/ asthma or knowledge of premature birth.  Sleeping ok flat without nocturnal  or early am exacerbation  of respiratory  c/o's or need for noct saba. Also denies any obvious fluctuation of symptoms with weather or environmental changes or other aggravating or alleviating factors except as outlined above   Current Allergies, Complete Past Medical History, Past Surgical History, Family History, and Social History were reviewed in  [facility] electronic medical record.  ROS  The following are not active complaints unless bolded Hoarseness, sore throat, dysphagia, dental problems, itching, sneezing,  nasal congestion or discharge of excess mucus or purulent secretions, ear ache,   fever, chills, sweats, unintended wt loss or wt gain, classically pleuritic or exertional cp,  orthopnea pnd or leg swelling, presyncope, palpitations, abdominal pain, anorexia, nausea, vomiting, diarrhea  or change in bowel habits or change in bladder habits, change in stools or change in urine, dysuria, hematuria,  rash, arthralgias, visual complaints, headache, numbness, weakness or ataxia or problems with walking or coordination,  change in mood/affect or memory.        Current Meds  Medication Sig  . albuterol (PROAIR HFA) 108 (90 Base) MCG/ACT inhaler INHALE 2 PUFFS INTO THE LUNGS EVERY 6 HOURS AS  NEEDED FOR WHEEZING OR SHORTNESS OF BREATH  . albuterol (PROVENTIL) (2.5 MG/3ML) 0.083% nebulizer solution Take 3 mLs (2.5 mg total) by nebulization every 6 (six) hours as needed for wheezing or shortness of breath.  Marland Kitchen aspirin 81 MG tablet Take 81 mg by mouth daily.  Marland Kitchen atorvastatin (LIPITOR) 40 MG tablet Take 1 tablet (40 mg total) by mouth daily.  Marland Kitchen b complex vitamins tablet Take 1 tablet by mouth daily.  . Cyclobenzaprine HCl (FLEXERIL PO) Take 1 tablet by mouth 3 (three) times daily as needed.  . gabapentin (NEURONTIN) 300 MG capsule TAKE 1 CAPSULE BY MOUTH AT BEDTIME FOR  1  WEEK  THEN  INCREASE  TO  1  CAPSULE  TWICE  DAILY  . Multiple Vitamin (MULTIVITAMIN) capsule Take 1 capsule by mouth daily.  Marland Kitchen oxyCODONE-acetaminophen (PERCOCET/ROXICET) 5-325 MG per tablet Take 1 tablet by mouth every 6 (six) hours as needed.  . predniSONE (DELTASONE) 10 MG tablet Take  4 each am x 2 days,   2 each am x 2 days,  1 each am x 2 days and stop  . Sildenafil Citrate (VIAGRA PO) Take 1 tablet by mouth as needed.  . sodium chloride (OCEAN) 0.65 % nasal spray Place 1 spray into the nose as needed for congestion.        . [DISCONTINUED] predniSONE (DELTASONE) 10 MG tablet Take  4 each am x 2 days,   2 each am x 2 days,  1 each am x 2 days and stop  . [  Tiotropium Bromide Monohydrate (SPIRIVA RESPIMAT) 2.5 MCG/ACT AERS Inhale 2 puffs into the lungs daily.                   Objective:   Physical Exam  01/18/2014       179 > 02/07/2015 190 > 03/21/2015   192 > 06/23/2015  196 >   12/30/2015 198 > 03/31/2016 198 >  07/02/2016  201 > 12/30/2016 201 >  04/01/2017   195     02/09/13 177 lb (80.287 kg)  12/27/12 173 lb 12.8 oz (78.835 kg)    Elderly amb wm nad / slt rattling cough   Vital signs reviewed  - Note on arrival 02 sats 92% RA          LUNGS: no acc muscle use,  Nl contour chest with insp and exp rhonchi bilaterally    CV:  RRR  no s3 or murmur or increase in P2, and  1+ pitting bilateral lower ext  sym pitting edema    HEENT: nl turbinates bilaterally, and oropharynx. Nl external ear canals without cough reflex- poor dentition  NECK :  without JVD/Nodes/TM/ nl carotid upstrokes bilaterally   LUNGS: no acc muscle use,  Nl contour chest with minimal insp and exp rhonchi bilaterally    CV:  RRR  no s3 or murmur or increase in P2, and trace pitting bilateral pedal  edema   ABD:  soft and nontender with nl inspiratory excursion in the supine position. No bruits or organomegaly appreciated, bowel sounds nl  MS:  Nl gait/ ext warm without deformities, calf tenderness, cyanosis or clubbing No obvious joint restrictions   SKIN: warm and dry without lesions    NEURO:  alert, approp, nl sensorium with  no motor or cerebellar deficits apparent.          I personally reviewed images and agree with radiology impression as follows:  CXR:   09/28/16 Hyperexpansion with underlying chronic interstitial changes. No acute cardiopulmonary findings.    Assessment & Plan:

## 2017-04-01 NOTE — Patient Instructions (Addendum)
Plan A = Automatic = stop symbiocort and try on BEVESP  Take 2 puffs first thing in am and then another 2 puffs about 12 hours later.     Plan B = Backup Only use your albuterol as a rescue medication to be used if you can't catch your breath by resting or doing a relaxed purse lip breathing pattern.  - The less you use it, the better it will work when you need it. - Ok to use the inhaler up to 2 puffs  every 4 hours if you must but call for appointment if use goes up over your usual need - Don't leave home without it !!  (think of it like the spare tire for your car)   Plan C = Crisis - only use your albuterol nebulizer if you first try Plan B and it fails to help > ok to use the nebulizer up to every 4 hours but if start needing it regularly call for immediate appointment  Plan D = Deltasone, take x 6 days if Plan C isn't working   The key is to stop smoking completely before smoking completely stops you!   Please schedule a follow up visit in 3 months but call sooner if needed with drug formulary list of alternatives

## 2017-04-01 NOTE — Assessment & Plan Note (Signed)

## 2017-04-01 NOTE — Assessment & Plan Note (Addendum)
-   02/09/2013  FEV1 2.14 (68%) ratio 60 p 30% better from B2 and dloc 52 corrects to 69 - 02/07/2015  extensive coaching HFA effectiveness =    90% from a baseline of 75%  - 02/07/2015  Walked RA x 3 laps @ 185 ft each stopped due to  End of study, nl pace, no sob or desat   - PFT's  03/21/2015  FEV1 1.92 (62 % ) ratio 60  p 14 % improvement from saba with DLCO  47 % corrects to 62 % for alv volume   - trial of spiriva respimat 03/21/2015  With goal to be able to walk up inclines > no better 06/23/2015 > try bevespi 2 bid x 3 weeks  -sob   Worse p 3 weeks bevespi  so resumed symb/spirva 06/25/2015  - quite smoking 07/2015> e cigs - referred to rehab 09/22/2015 >>> d/c'd p 19 sessions on 01/22/16 due to costs - 03/31/2016 rec pred x 6 days if needed  - 12/30/2016    restart spiriva 2.5 x 2 each am > could not afford it   - 04/01/2017  After extensive coaching HFA effectiveness =    90% try bevespi    Pt is Group B in terms of symptom/risk and laba/lama therefore appropriate rx at this point.     Has pred prn for exac    Formulary restrictions will be an ongoing challenge for the forseable future and I would be happy to pick an alternative if the pt will first  provide me a list of them but pt  will need to return here for training for any new device that is required eg dpi vs hfa vs respimat.    In meantime we can always provide samples so the patient never runs out of any needed respiratory medications.    I had an extended discussion with the patient reviewing all relevant studies completed to date and  lasting 15 to 20 minutes of a 25 minute visit    Each maintenance medication was reviewed in detail including most importantly the difference between maintenance and prns and under what circumstances the prns are to be triggered using an action plan format that is not reflected in the computer generated alphabetically organized AVS.    Please see AVS for specific instructions unique to this visit that  I personally wrote and verbalized to the the pt in detail and then reviewed with pt  by my nurse highlighting any  changes in therapy recommended at today's visit to their plan of care.

## 2017-04-09 LAB — LIPID PANEL
Chol/HDL Ratio: 3.2 ratio (ref 0.0–5.0)
Cholesterol, Total: 154 mg/dL (ref 100–199)
HDL: 48 mg/dL (ref >39)
LDL Calculated: 83 mg/dL (ref 0–99)
Triglycerides: 114 mg/dL (ref 0–149)
VLDL Cholesterol Cal: 23 mg/dL (ref 5–40)

## 2017-04-09 LAB — HEPATIC FUNCTION PANEL
ALT: 26 IU/L (ref 0–44)
AST: 27 IU/L (ref 0–40)
Albumin: 4.7 g/dL (ref 3.5–4.8)
Alkaline Phosphatase: 80 IU/L (ref 39–117)
Bilirubin Total: 0.4 mg/dL (ref 0.0–1.2)
Bilirubin, Direct: 0.14 mg/dL (ref 0.00–0.40)
Total Protein: 7.8 g/dL (ref 6.0–8.5)

## 2017-04-13 ENCOUNTER — Encounter: Payer: Self-pay | Admitting: *Deleted

## 2017-05-10 ENCOUNTER — Ambulatory Visit: Payer: Medicare Other | Admitting: Cardiovascular Disease

## 2017-05-10 ENCOUNTER — Encounter: Payer: Self-pay | Admitting: Cardiovascular Disease

## 2017-05-10 VITALS — BP 140/86 | HR 88 | Ht 70.5 in | Wt 201.8 lb

## 2017-05-10 DIAGNOSIS — E785 Hyperlipidemia, unspecified: Secondary | ICD-10-CM

## 2017-05-10 DIAGNOSIS — Z720 Tobacco use: Secondary | ICD-10-CM | POA: Diagnosis not present

## 2017-05-10 DIAGNOSIS — I739 Peripheral vascular disease, unspecified: Secondary | ICD-10-CM

## 2017-05-10 MED ORDER — VARENICLINE TARTRATE 0.5 MG X 11 & 1 MG X 42 PO MISC: ORAL | 0 refills | Status: DC

## 2017-05-10 MED ORDER — VARENICLINE TARTRATE 1 MG PO TABS: 1.0000 mg | ORAL_TABLET | Freq: Two times a day (BID) | ORAL | 1 refills | Status: DC

## 2017-05-10 NOTE — Patient Instructions (Signed)
Medication Instructions:   TAKE CHANTIX AS DIRECTED  Follow-Up:  Your physician wants you to follow-up in: Joel Perez will receive a reminder letter in the mail two months in advance. If you don't receive a letter, please call our office to schedule the follow-up appointment.   If you need a refill on your cardiac medications before your next appointment, please call your pharmacy.

## 2017-05-10 NOTE — Progress Notes (Signed)
Cardiology Office Note   Date:  05/10/2017   ID:  CAMRAN KEADY, DOB [birth year], MRN [id number]  PCP:  [name], MD  Cardiologist:  Dr. Ellyn Hack No chief complaint on file.     History of Present Illness: Joel Perez is a 71 y.o. male who is here today for follow-up visit regarding peripheral arterial disease. He has known history of chronic diastolic heart failure, tobacco use, aortic valve disease and COPD. Echocardiogram showed normal LV systolic function with moderate LVH, mild to moderate aortic regurgitation with no significant stenosis. He is known to have leg edema thought to be due to chronic venous insufficiency. Venous Doppler was unremarkable. He was seen recently for mild bilateral leg claudication with symptoms of neuropathy as well.  He also complained of leg edema thought to be due to chronic venous insufficiency. He underwent lower extremity arterial Doppler which showed normal ABI bilaterally. Duplex showed moderate bilateral iliac disease and moderate bilateral common femoral artery disease with mild SFA disease and three-vessel runoff bilaterally.  He developed a rash on the top of the right foot which has been getting worse.  His leg claudications are stable.  He continues to smoke about 10 cigarettes a day.   Past Medical History:  Diagnosis Date  . 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 BPH (benign prostatic hyperplasia)   . Capsular cataract of left eye   . COPD (chronic obstructive pulmonary disease) (HCC)    Dr. [name]  . Current smoker    Long-term smoker. Not interested in quitting  . Diverticulosis    With intermittent diverticulitis  . Erectile dysfunction   . Essential hypertension    Several recorded blood pressures greater than 140/90.  . H/O mitral valve prolapse    By report, but not confirmed by echo.  . Low back pain    Chronic. Followed by Dr. Suella Broad  . Mild aortic regurgitation 12/2013   Mild to moderate  aortic regurgitation on echo  . Neuropathy     Past Surgical History:  Procedure Laterality Date  . COLON SURGERY  2000  . ELBOW SURGERY    . TRANSTHORACIC ECHOCARDIOGRAM  12/29/2016   EF 60-65%. Moderate LVH. Mild aortic valve calcification. Mild to moderate regurgitation with no stenosis. Mitral valve annular calcification with no comment of prolapse or regurgitation.     Current Outpatient Medications  Medication Sig Dispense Refill  . albuterol (PROAIR HFA) 108 (90 Base) MCG/ACT inhaler INHALE 2 PUFFS INTO THE LUNGS EVERY 6 HOURS AS NEEDED FOR WHEEZING OR SHORTNESS OF BREATH 1 each 5  . albuterol (PROVENTIL) (2.5 MG/3ML) 0.083% nebulizer solution Take 3 mLs (2.5 mg total) by nebulization every 6 (six) hours as needed for wheezing or shortness of breath. 75 mL 11  . aspirin 81 MG tablet Take 81 mg by mouth daily.    Marland Kitchen atorvastatin (LIPITOR) 40 MG tablet Take 1 tablet (40 mg total) by mouth daily. 90 tablet 3  . b complex vitamins tablet Take 1 tablet by mouth daily.    . Cyclobenzaprine HCl (FLEXERIL PO) Take 1 tablet by mouth 3 (three) times daily as needed.    . gabapentin (NEURONTIN) 300 MG capsule TAKE 1 CAPSULE BY MOUTH AT BEDTIME FOR  1  WEEK  THEN  INCREASE  TO  1  CAPSULE  TWICE  DAILY 180 capsule 0  . Glycopyrrolate-Formoterol (BEVESPI AEROSPHERE) 9-4.8 MCG/ACT AERO Inhale 2 puffs every 12 (twelve) hours into the  lungs. 1 Inhaler 11  . Multiple Vitamin (MULTIVITAMIN) capsule Take 1 capsule by mouth daily.    Marland Kitchen oxyCODONE-acetaminophen (PERCOCET/ROXICET) 5-325 MG per tablet Take 1 tablet by mouth every 6 (six) hours as needed.    . predniSONE (DELTASONE) 10 MG tablet Take  4 each am x 2 days,   2 each am x 2 days,  1 each am x 2 days and stop 14 tablet 11  . Sildenafil Citrate (VIAGRA PO) Take 1 tablet by mouth as needed.    . sodium chloride (OCEAN) 0.65 % nasal spray Place 1 spray into the nose as needed for congestion.    . SYMBICORT 160-4.5 MCG/ACT inhaler Inhale 2 puffs  into the lungs 2 (two) times daily.    . varenicline (CHANTIX CONTINUING MONTH PAK) 1 MG tablet Take 1 tablet (1 mg total) by mouth 2 (two) times daily. 60 tablet 1  . varenicline (CHANTIX STARTING MONTH PAK) 0.5 MG X 11 & 1 MG X 42 tablet Take one 0.5 mg tablet by mouth once daily for 3 days, then increase to one 0.5 mg tablet twice daily for 4 days, then increase to one 1 mg tablet twice daily. 53 tablet 0   No current facility-administered medications for this visit.     Allergies:   Sulfa antibiotics    Social History:  The patient  reports that he has been smoking cigarettes.  He has a 100.00 pack-year smoking history. he has never used smokeless tobacco. He reports that he drinks alcohol. He reports that he does not use drugs.   Family History:  The patient's family history includes Brain cancer in his brother; Breast cancer in his mother; Heart disease in his father; Other in his sister.    ROS:  Please see the history of present illness.   Otherwise, review of systems are positive for none.   All other systems are reviewed and negative.    PHYSICAL EXAM: VS:  BP 140/86   Pulse 88   Ht 5' 10.5" (1.791 m)   Wt 201 lb 12.8 oz (91.5 kg)   BMI 28.55 kg/m  , BMI Body mass index is 28.55 kg/m. GEN: Well nourished, well developed, in no acute distress  HEENT: normal  Neck: no JVD, carotid bruits, or masses Cardiac: RRR; no  rubs, or gallops. There is one out of 6 systolic ejection murmur in the aortic area. There is mild to moderate bilateral leg edema Respiratory:  clear to auscultation bilaterally, normal work of breathing GI: soft, nontender, nondistended, + BS MS: no deformity or atrophy  Skin: warm and dry, no rash Neuro:  Strength and sensation are intact Psych: euthymic mood, full affect Vascular: Femoral pulses are normal bilaterally. Dorsalis pedis and posterior tibial is palpable on both sides.  EKG:  EKG is not ordered today.    Recent Labs: 04/08/2017: ALT 26     Lipid Panel    Component Value Date/Time   CHOL 154 04/08/2017 1504   TRIG 114 04/08/2017 1504   HDL 48 04/08/2017 1504   CHOLHDL 3.2 04/08/2017 1504   LDLCALC 83 04/08/2017 1504      Wt Readings from Last 3 Encounters:  05/10/17 201 lb 12.8 oz (91.5 kg)  04/01/17 195 lb (88.5 kg)  02/08/17 201 lb 6.4 oz (91.4 kg)       PAD Screen 01/17/2017  Previous surgical procedure? Yes  Dates of procedures colon ressection  Pain with walking? Yes  Subsides with rest? Yes  Painful, non-healing  ulcers? Yes  Extremities discolored? Yes      ASSESSMENT AND PLAN:  1.  Peripheral arterial disease:The patient has mild to moderate diffuse peripheral arterial disease with no evidence of obstructive stenosis.  Continue medical therapy.  2. Tobacco use: I discussed with him the importance of smoking cessation especially in the presence of peripheral arterial disease.  He needs something to help him quit and I prescribed Chantix.  He has taken it in the past with no significant side effects.  3. Hyperlipidemia: Continue treatment with atorvastatin.  4.  Skin rash on the right foot: I agree that this requires dermatology evaluation with possible need for biopsy if no improvement.    Disposition:   FU with me in 4 months  Signed,  Kathlyn Sacramento, MD  05/10/2017 5:33 PM    [place]

## 2017-07-04 ENCOUNTER — Encounter: Payer: Self-pay | Admitting: Internal Medicine

## 2017-07-04 ENCOUNTER — Ambulatory Visit: Payer: Medicare Other | Admitting: Internal Medicine

## 2017-07-04 VITALS — BP 106/60 | HR 83 | Ht 70.5 in | Wt 201.0 lb

## 2017-07-04 DIAGNOSIS — J449 Chronic obstructive pulmonary disease, unspecified: Secondary | ICD-10-CM

## 2017-07-04 DIAGNOSIS — F17210 Nicotine dependence, cigarettes, uncomplicated: Secondary | ICD-10-CM

## 2017-07-04 NOTE — Assessment & Plan Note (Signed)
-   02/09/2013  FEV1 2.14 (68%) ratio 60 p 30% better from B2 and dloc 52 corrects to 69 - 02/07/2015  extensive coaching HFA effectiveness =    90% from a baseline of 75%  - 02/07/2015  Walked RA x 3 laps @ 185 ft each stopped due to  End of study, nl pace, no sob or desat   - PFT's  03/21/2015  FEV1 1.92 (62 % ) ratio 60  p 14 % improvement from saba with DLCO  47 % corrects to 62 % for alv volume   - trial of spiriva respimat 03/21/2015  With goal to be able to walk up inclines > no better 06/23/2015 > try bevespi 2 bid x 3 weeks  -sob   Worse p 3 weeks bevespi  so resumed symb/spirva 06/25/2015  - referred to rehab 09/22/2015 >>> d/c'd p 19 sessions on 01/22/16 due to costs - 03/31/2016 rec pred x 6 days if needed  - 12/30/2016    restart spiriva 2.5 x 2 each am > could not afford it  - 04/01/2017    try bevespi   - 07/04/2017  After extensive coaching inhaler device  effectiveness =    90% > continue bevespi and pred as Plan D    May have a bit more AB component off symb and on bevesp but has not used much of his backup action plans at this point and working on stop smoking so no need to change maint rx at this point  I had an extended discussion with the patient reviewing all relevant studies completed to date and  lasting 15 to 20 minutes of a 25 minute visit    Each maintenance medication was reviewed in detail including most importantly the difference between maintenance and prns and under what circumstances the prns are to be triggered using an action plan format that is not reflected in the computer generated alphabetically organized AVS.    Please see AVS for specific instructions unique to this visit that I personally wrote and verbalized to the the pt in detail and then reviewed with pt  by my nurse highlighting any  changes in therapy recommended at today's visit to their plan of care.

## 2017-07-04 NOTE — Assessment & Plan Note (Signed)
>   3 m   Encouraged re use of chantix - advised there really is no fixed limit if needs to maintain on  A low dose p 3 months  Starter rx

## 2017-07-04 NOTE — Progress Notes (Signed)
Subjective:    Patient ID: Joel Perez, male    DOB: 1945-09-28   MRN: [id number]    Brief patient profile:   49 yowm active smoker referred by Dr Moreen Fowler for eval of copd with GOLD II criteria established 02/09/13    History of Present Illness  12/27/2012 1st pulmonary eval/ Wert cc onset x 2009 am cough cough/ congestion and progresive doe with white mucus x sev tbsp x sev min better p proaire and maintained on spiriva which he feels doesn't work as well as advair. Doe x one flight steps, dragging the garbage to street stops x 2. He denies cough to me but actually has a rattling with cough maneuver but doesn't bring up much mucus, what he does bring up is mostly in ams and mucoid and some better since quit smoking  rec symbicort 160 Take 2 puffs first thing in am and then another 2 puffs about 12 hours later.  Only use your albuterol (proaire) as a rescue medication to be used if you can't catch your breath by resting or doing a relaxed purse lip breathing pattern. The less you use it, the better it will work when you need it.  As you improve ok to stop spiriva  Please schedule a follow up office visit in 6 weeks, call sooner if needed with pft's   02/09/2013 f/u ov/Wert re: GOLD II with reversibilty Chief Complaint  Patient presents with  . Follow-up    W/ PFT. Pt reports his breathing is better. He gets up tan color phlem up right after breathing tx  still rattling some but much better.  rec Continue symbicort 160 Take 2 puffs first thing in am and then another 2 puffs about 12 hours later.  Only use your proair as a rescue medication to be used if you can't catch your breath by resting or doing a relaxed purse lip breathing pattern. The less you use it, the better it will work when you need it.  Don't leave home without it [place] to leave off spiriva     03/21/2015  f/u ov/Wert re: COPD III with reversibilty on symb 160 2bid / still smoking Chief Complaint  Patient presents with  .  Follow-up    PFT done today. Breathing has improved since the last visit. He is usin proair 1-2 x per wk on average  Wants to be able to walk up inclines s sob  rec Add spiriva respimat x 2 puffs each am only right after the symbicort 160  Only use your albuterol as rescue    06/23/2015  f/u ov/Wert re: copd GOLDIII with reversibility /  symb 160 2bid and spiriva daily   Chief Complaint  Patient presents with  . Follow-up    Breathing is unchanged. He uses rescue inhaler 1 x pr wk on average.   using albuterol for noisy breathing at hs / trying e cigs but still smoking reg cigs also Doe = MMRC1 = can walk nl pace, flat grade, can't hurry or go uphills or steps s sob    rec Bevespi Take 2 puffs first thing in am and then another 2 puffs about 12 hours later and stop symbicort and spiriva for now  Only use your albuterol as a rescue medication The key is to stop smoking completely before smoking completely stops you- think of the e cigs as a one way bridge off all tobacco products   09/22/2015  f/u ov/Wert re: GOLD III copd/ maint rx =  symbicort/ spiriva resp Chief Complaint  Patient presents with  . Follow-up    Stopped smoking 1 month ago. He states breathing has been worse x 2 months- using albuterol 2 x daily on average. He has also noticed some wheezing at night.   now has neb uses at hs helps more than hfa but confused as to how to use it with his other resp meds  Doe continues = MMRC1 = can walk nl pace, flat grade, can't hurry or go uphills or steps s sob   rec Prednisone 10 mg take  4 each am x 2 days,   2 each am x 2 days,  1 each am x 2 days and stop  Plan A = Automatic = Symbicort 160 2 pffs first thing followed by Anna Genre and another 2 pffs 12 hours later  Plan B = Backup Only use your albuterol  Plan C = Crisis - only use your albuterol nebulizer if you first try Plan B and it fails to help > ok to use the nebulizer up to every 4 hours but if start needing it regularly call  for immediate appointment We will be referring you to rehab at [facility]      03/31/2016  f/u ov/Wert re:  GOLD III/ still not smoking cigs/ some ecigs / today took symbicort 2 and spivirva (one puff to make it last)  Chief Complaint  Patient presents with  . Follow-up    Breathing is doing well. He states had to stop attending rehab due to insurance issues. He states he uses rescue inhaler nightly due to wheezing.    at end of rehab able to do 2 mph flat x 15 min but has not continued at home though he does have similar equipment  Using proair 1 hour after symbicort in pm " to break up my night congestion/wheezing" Not taking either symb or spiriva as rec at previous ov ? Due to cost ? Due to not processing instructions? rec Plan A = Automatic = Symbicort 160 2 pffs first thing in AM  followed by Spriva and another 2 pffs 12 hours later of symbicort only Plan B = Backup Only use your albuterol as a rescue medication to be used if you can't catch your breath by resting or doing a relaxed purse lip breathing pattern.  - The less you use it, the better it will work when you need it. - Ok to use the inhaler up to 2 puffs  every 4 hours if you must but call for appointment if use goes up over your usual need - Don't leave home without it !!  (think of it like the spare tire for your car)  Plan C = Crisis - only use your albuterol nebulizer if you first try Plan B and it fails to help > ok to use the nebulizer up to every 4 hours but if start needing it regularly call for immediate appointment If breathing / wheezing coughing getting worse > Prednisone 10 mg take  4 each am x 2 days,   2 each am x 2 days,  1 each am x 2 days and stop  Try to walk daily if possible to let me know if you are losing any ground      07/02/2016  f/u ov/Wert re:  Copd III symb 160 no lama and saba sev times a day at most  Chief Complaint  Patient presents with  . Follow-up    COPD, pt reports  he is doing again,  better since he quit smoking  treadmlll 2 x week x 15 min s stopping 2 mph/ flat  Rest x 10 min then does another 15 mn rec Plan A = Automatic = symbicort 160 Take 2 puffs first thing in am and then another 2 puffs about 12 hours later.  Plan B = Backup Only use your albuterol as a rescue medication to be used Plan C = Crisis - only use your albuterol nebulizer if you first try Plan B and it fails to help > ok to use the nebulizer up to every 4 hours but if start needing it regularly call for immediate appointment Plan D = Delatasone - if B and C not adequate> Prednisone 10 mg take  4 each am x 2 days,   2 each am x 2 days,  1 each am x 2 days and stop      12/30/2016  f/u ov/Wert re:  GOLD III copd/ resumed smoking due to leg pain from etoh neuropathy  Chief Complaint  Patient presents with  . Follow-up    Pt states started having swelling in his legs and neuropathy since April 2018- started smoking shortly after this started and now his breathing is worse.  He notices wheezing at night. He is using albuterol inhaler 2-3 x per day, but has not needed neb.   has used pred several times for worse breathing and helps - started back on 40 mg again on day of ov Congested cough but mostly mucoid, esp in am Feels was doing better on spiriva   Feels leg swelling worse on gabapentin Rec Add back spiriva 2.5 x 2 pffs each am  The key is to stop smoking completely before smoking completely stops you!  Please schedule a follow up visit in 3 months but call sooner if needed  with all medications /inhalers/ solutions in hand so we can verify exactly what you are taking. This includes all medications from all doctors and over the counters     04/01/2017  f/u ov/Wert re:  GOLD  III copd/ still smoking helps pain / did not bring meds or formulary alternatives  Chief Complaint  Patient presents with  . Follow-up    3 month follow up for COPD. States breathing has been about the same since last visit.  Has not refilled Spriva due to being in the donut hole. Still having SOB with activity.    using rescue saba 2-3 x per day Was using spiriva and improved  but too expensive on his plan and did not call with list of alternatives No need for neb at all  Doe = MMRC2 = can't walk a nl pace on a flat grade s sob but does fine slow and flat eg slow treadmill flat grade x 10 min No recent pred  rec  Plan A = Automatic = stop symbiocort and try on BEVESP  Take 2 puffs first thing in am and then another 2 puffs about 12 hours later.  Plan B = Backup Only use your albuterol as a rescue medication  Plan C = Crisis - only use your albuterol nebulizer if you first try Plan B and it fails to help > Plan D = Deltasone, take x 6 days if Plan C isn't working  The key is to stop smoking completely before smoking completely stops you!  Please schedule a follow up visit in 3 months but call sooner if needed with drug formulary list of  alternatives    07/04/2017  f/u ov/Wert re: GOLD III and sill smoking - no 02  Chief Complaint  Patient presents with  . Follow-up    Breathing seems worse and he relates this to lack of exercise. He states he checks his o2 sats at home and sometimes he gets readings in the upper 80's. He uses his albuterol inhaler once daily on average but has not needed neb.   Dyspnea:  MMRC3 = can't walk 100 yards even at a slow pace at a flat grade s stopping due to sob and only desats occ at highest levels of activity  Cough: each am and sometimes noct > white mucus Sleep: R side  2 pillows   Has not used Plan B much and none at all C or D   No obvious day to day or daytime variability or assoc excess/ purulent sputum or mucus plugs or hemoptysis or cp or chest tightness, subjective wheeze or overt sinus or hb symptoms. No unusual exposure hx or h/o childhood pna/ asthma or knowledge of premature birth.  Sleeping ok flat without nocturnal  or early am exacerbation  of respiratory  c/o's  or need for noct saba. Also denies any obvious fluctuation of symptoms with weather or environmental changes or other aggravating or alleviating factors except as outlined above   Current Allergies, Complete Past Medical History, Past Surgical History, Family History, and Social History were reviewed in [employer] record.  ROS  The following are not active complaints unless bolded Hoarseness, sore throat, dysphagia, dental problems, itching, sneezing,  nasal congestion or discharge of excess mucus or purulent secretions, ear ache,   fever, chills, sweats, unintended wt loss or wt gain, classically pleuritic or exertional cp,  orthopnea pnd or leg swelling, presyncope, palpitations, abdominal pain, anorexia, nausea, vomiting, diarrhea  or change in bowel habits or change in bladder habits, change in stools or change in urine, dysuria, hematuria,  rash, arthralgias, visual complaints, headache, numbness, weakness or ataxia or problems with walking or coordination,  change in mood/affect or memory.        Current Meds  Medication Sig  . albuterol (PROAIR HFA) 108 (90 Base) MCG/ACT inhaler INHALE 2 PUFFS INTO THE LUNGS EVERY 6 HOURS AS NEEDED FOR WHEEZING OR SHORTNESS OF BREATH  . albuterol (PROVENTIL) (2.5 MG/3ML) 0.083% nebulizer solution Take 3 mLs (2.5 mg total) by nebulization every 6 (six) hours as needed for wheezing or shortness of breath.  Marland Kitchen aspirin 81 MG tablet Take 81 mg by mouth daily.  Marland Kitchen b complex vitamins tablet Take 1 tablet by mouth daily.  . Cyclobenzaprine HCl (FLEXERIL PO) Take 1 tablet by mouth 3 (three) times daily as needed.  . gabapentin (NEURONTIN) 300 MG capsule TAKE 1 CAPSULE BY MOUTH AT BEDTIME FOR  1  WEEK  THEN  INCREASE  TO  1  CAPSULE  TWICE  DAILY  . Glycopyrrolate-Formoterol (BEVESPI AEROSPHERE) 9-4.8 MCG/ACT AERO Inhale 2 puffs every 12 (twelve) hours into the lungs.  . Multiple Vitamin (MULTIVITAMIN) capsule Take 1 capsule by mouth daily.  Marland Kitchen  oxyCODONE-acetaminophen (PERCOCET/ROXICET) 5-325 MG per tablet Take 1 tablet by mouth every 6 (six) hours as needed.  . Sildenafil Citrate (VIAGRA PO) Take 1 tablet by mouth as needed.  . sodium chloride (OCEAN) 0.65 % nasal spray Place 1 spray into the nose as needed for congestion.  . SYMBICORT 160-4.5 MCG/ACT inhaler Inhale 2 puffs into the lungs 2 (two) times daily.  . varenicline (CHANTIX  CONTINUING MONTH PAK) 1 MG tablet Take 1 tablet (1 mg total) by mouth 2 (two) times daily.  . varenicline (CHANTIX STARTING MONTH PAK) 0.5 MG X 11 & 1 MG X 42 tablet Take one 0.5 mg tablet by mouth once daily for 3 days, then increase to one 0.5 mg tablet twice daily for 4 days, then increase to one 1 mg tablet twice daily.                      Objective:   Physical Exam  01/18/2014       179 > 02/07/2015 190 > 03/21/2015   192 > 06/23/2015  196 >   12/30/2015 198 > 03/31/2016 198 >  07/02/2016  201 > 12/30/2016 201 >  04/01/2017   195 >  07/04/2017  201     02/09/13 177 lb (80.287 kg)  12/27/12 173 lb 12.8 oz (78.835 kg)    Somber wm nad  Smells heavily of cits   Vital signs reviewed - Note on arrival 02 sats  96% on RA      HEENT: nl dentition, turbinates bilaterally, and oropharynx. Nl external ear canals without cough reflex   NECK :  without JVD/Nodes/TM/ nl carotid upstrokes bilaterally   LUNGS: no acc muscle use, slt barrel contour chest wall with mild bilateral  Insp/exp rhonchi without cough on insp or exp maneuver and hyper resonance to  percussion    CV:  RRR  no s3 or murmur or increase in P2, and trace bilateral ankle edema   ABD:  soft and nontender with nl inspiratory excursion in the supine position. No bruits or organomegaly appreciated, bowel sounds nl  MS:  Nl gait/ ext warm without deformities, calf tenderness, cyanosis or clubbing No obvious joint restrictions   SKIN: warm and dry without lesions    NEURO:  alert, approp, nl sensorium with  no motor or cerebellar  deficits apparent.             Assessment & Plan:

## 2017-07-04 NOTE — Patient Instructions (Signed)
The key is to stop smoking completely before smoking completely stops you!    Please schedule a follow up visit in 3 months but call sooner if needed  

## 2017-09-07 ENCOUNTER — Other Ambulatory Visit: Payer: Self-pay | Admitting: Internal Medicine

## 2017-09-20 ENCOUNTER — Other Ambulatory Visit: Payer: Self-pay | Admitting: Internal Medicine

## 2017-10-03 ENCOUNTER — Encounter: Payer: Self-pay | Admitting: Internal Medicine

## 2017-10-03 ENCOUNTER — Ambulatory Visit: Payer: Medicare Other | Admitting: Internal Medicine

## 2017-10-03 VITALS — BP 124/80 | HR 89 | Ht 70.5 in | Wt 197.0 lb

## 2017-10-03 DIAGNOSIS — F17210 Nicotine dependence, cigarettes, uncomplicated: Secondary | ICD-10-CM | POA: Diagnosis not present

## 2017-10-03 DIAGNOSIS — J449 Chronic obstructive pulmonary disease, unspecified: Secondary | ICD-10-CM

## 2017-10-03 MED ORDER — PREDNISONE 10 MG PO TABS: ORAL_TABLET | ORAL | 1 refills | Status: DC

## 2017-10-03 NOTE — Patient Instructions (Addendum)
Plan A = Automatic =  BEVESP  Take 2 puffs first thing in am and then another 2 puffs about 12 hours later.   Work on maintaining perfect  inhaler technique:  relax and gently blow all the way out then take a nice smooth deep breath back in, triggering the inhaler at same time you start breathing in.  Hold for up to 5 seconds if you can. Blow out thru nose. Rinse and gargle with water when done     Plan B = Backup Only use your albuterol as a rescue medication to be used if you can't catch your breath by resting or doing a relaxed purse lip breathing pattern.  - The less you use it, the better it will work when you need it. - Ok to use the inhaler up to 2 puffs  every 4 hours if you must but call for appointment if use goes up over your usual need - Don't leave home without it !!  (think of it like the spare tire for your car)   Plan C = Crisis - only use your albuterol nebulizer if you first try Plan B and it fails to help > ok to use the nebulizer up to every 4 hours but if start needing it regularly call for immediate appointment  Plan D = Deltasone, take x 2 daily until better then 1 daily x 5 days until better and stop   The key is to stop smoking completely before smoking completely stops you!    Please schedule a follow up visit in 3 months but call sooner if needed  with all medications /inhalers/ solutions in hand so we can verify exactly what you are taking. This includes all medications from all doctors and over the counters

## 2017-10-03 NOTE — Progress Notes (Signed)
Subjective:    Patient ID: Joel Perez, male    DOB: 10-18-1945   MRN: [id number]    Brief patient profile:   40 yowm active smoker referred by Dr Moreen Fowler for eval of copd with GOLD II criteria established 02/09/13    History of Present Illness  12/27/2012 1st pulmonary eval/ Wert cc onset x 2009 am cough cough/ congestion and progresive doe with white mucus x sev tbsp x sev min better p proaire and maintained on spiriva which he feels doesn't work as well as advair. Doe x one flight steps, dragging the garbage to street stops x 2. He denies cough to me but actually has a rattling with cough maneuver but doesn't bring up much mucus, what he does bring up is mostly in ams and mucoid and some better since quit smoking  rec symbicort 160 Take 2 puffs first thing in am and then another 2 puffs about 12 hours later.  Only use your albuterol (proaire) as a rescue medication to be used if you can't catch your breath by resting or doing a relaxed purse lip breathing pattern. The less you use it, the better it will work when you need it.  As you improve ok to stop spiriva  Please schedule a follow up office visit in 6 weeks, call sooner if needed with pft's   02/09/2013 f/u ov/Wert re: GOLD II with reversibilty Chief Complaint  Patient presents with  . Follow-up    W/ PFT. Pt reports his breathing is better. He gets up tan color phlem up right after breathing tx  still rattling some but much better.  rec Continue symbicort 160 Take 2 puffs first thing in am and then another 2 puffs about 12 hours later.  Only use your proair as a rescue medication to be used if you can't catch your breath by resting or doing a relaxed purse lip breathing pattern. The less you use it, the better it will work when you need it.  Don't leave home without it [name] to leave off spiriva     03/21/2015  f/u ov/Wert re: COPD III with reversibilty on symb 160 2bid / still smoking Chief Complaint  Patient presents with  .  Follow-up    PFT done today. Breathing has improved since the last visit. He is usin proair 1-2 x per wk on average  Wants to be able to walk up inclines s sob  rec Add spiriva respimat x 2 puffs each am only right after the symbicort 160  Only use your albuterol as rescue    06/23/2015  f/u ov/Wert re: copd GOLDIII with reversibility /  symb 160 2bid and spiriva daily   Chief Complaint  Patient presents with  . Follow-up    Breathing is unchanged. He uses rescue inhaler 1 x pr wk on average.   using albuterol for noisy breathing at hs / trying e cigs but still smoking reg cigs also Doe = MMRC1 = can walk nl pace, flat grade, can't hurry or go uphills or steps s sob    rec Bevespi Take 2 puffs first thing in am and then another 2 puffs about 12 hours later and stop symbicort and spiriva for now  Only use your albuterol as a rescue medication The key is to stop smoking completely before smoking completely stops you- think of the e cigs as a one way bridge off all tobacco products   09/22/2015  f/u ov/Wert re: GOLD III copd/ maint rx =  symbicort/ spiriva resp Chief Complaint  Patient presents with  . Follow-up    Stopped smoking 1 month ago. He states breathing has been worse x 2 months- using albuterol 2 x daily on average. He has also noticed some wheezing at night.   now has neb uses at hs helps more than hfa but confused as to how to use it with his other resp meds  Doe continues = MMRC1 = can walk nl pace, flat grade, can't hurry or go uphills or steps s sob   rec Prednisone 10 mg take  4 each am x 2 days,   2 each am x 2 days,  1 each am x 2 days and stop  Plan A = Automatic = Symbicort 160 2 pffs first thing followed by Anna Genre and another 2 pffs 12 hours later  Plan B = Backup Only use your albuterol  Plan C = Crisis - only use your albuterol nebulizer if you first try Plan B and it fails to help > ok to use the nebulizer up to every 4 hours but if start needing it regularly call  for immediate appointment We will be referring you to rehab at [facility]      03/31/2016  f/u ov/Wert re:  GOLD III/ still not smoking cigs/ some ecigs / today took symbicort 2 and spivirva (one puff to make it last)  Chief Complaint  Patient presents with  . Follow-up    Breathing is doing well. He states had to stop attending rehab due to insurance issues. He states he uses rescue inhaler nightly due to wheezing.    at end of rehab able to do 2 mph flat x 15 min but has not continued at home though he does have similar equipment  Using proair 1 hour after symbicort in pm " to break up my night congestion/wheezing" Not taking either symb or spiriva as rec at previous ov ? Due to cost ? Due to not processing instructions? rec Plan A = Automatic = Symbicort 160 2 pffs first thing in AM  followed by Spriva and another 2 pffs 12 hours later of symbicort only Plan B = Backup Only use your albuterol as a rescue medication to be used if you can't catch your breath by resting or doing a relaxed purse lip breathing pattern.  - The less you use it, the better it will work when you need it. - Ok to use the inhaler up to 2 puffs  every 4 hours if you must but call for appointment if use goes up over your usual need - Don't leave home without it !!  (think of it like the spare tire for your car)  Plan C = Crisis - only use your albuterol nebulizer if you first try Plan B and it fails to help > ok to use the nebulizer up to every 4 hours but if start needing it regularly call for immediate appointment If breathing / wheezing coughing getting worse > Prednisone 10 mg take  4 each am x 2 days,   2 each am x 2 days,  1 each am x 2 days and stop  Try to walk daily if possible to let me know if you are losing any ground      07/02/2016  f/u ov/Wert re:  Copd III symb 160 no lama and saba sev times a day at most  Chief Complaint  Patient presents with  . Follow-up    COPD, pt reports  he is doing again,  better since he quit smoking  treadmlll 2 x week x 15 min s stopping 2 mph/ flat  Rest x 10 min then does another 15 mn rec Plan A = Automatic = symbicort 160 Take 2 puffs first thing in am and then another 2 puffs about 12 hours later.  Plan B = Backup Only use your albuterol as a rescue medication to be used Plan C = Crisis - only use your albuterol nebulizer if you first try Plan B and it fails to help > ok to use the nebulizer up to every 4 hours but if start needing it regularly call for immediate appointment Plan D = Delatasone - if B and C not adequate> Prednisone 10 mg take  4 each am x 2 days,   2 each am x 2 days,  1 each am x 2 days and stop      12/30/2016  f/u ov/Wert re:  GOLD III copd/ resumed smoking due to leg pain from etoh neuropathy  Chief Complaint  Patient presents with  . Follow-up    Pt states started having swelling in his legs and neuropathy since April 2018- started smoking shortly after this started and now his breathing is worse.  He notices wheezing at night. He is using albuterol inhaler 2-3 x per day, but has not needed neb.   has used pred several times for worse breathing and helps - started back on 40 mg again on day of ov Congested cough but mostly mucoid, esp in am Feels was doing better on spiriva   Feels leg swelling worse on gabapentin Rec Add back spiriva 2.5 x 2 pffs each am  The key is to stop smoking completely before smoking completely stops you!  Please schedule a follow up visit in 3 months but call sooner if needed  with all medications /inhalers/ solutions in hand so we can verify exactly what you are taking. This includes all medications from all doctors and over the counters     04/01/2017  f/u ov/Wert re:  GOLD  III copd/ still smoking helps pain / did not bring meds or formulary alternatives  Chief Complaint  Patient presents with  . Follow-up    3 month follow up for COPD. States breathing has been about the same since last visit.  Has not refilled Spriva due to being in the donut hole. Still having SOB with activity.    using rescue saba 2-3 x per day Was using spiriva and improved  but too expensive on his plan and did not call with list of alternatives No need for neb at all  Doe = MMRC2 = can't walk a nl pace on a flat grade s sob but does fine slow and flat eg slow treadmill flat grade x 10 min No recent pred  rec  Plan A = Automatic = stop symbiocort and try on BEVESP  Take 2 puffs first thing in am and then another 2 puffs about 12 hours later.  Plan B = Backup Only use your albuterol as a rescue medication  Plan C = Crisis - only use your albuterol nebulizer if you first try Plan B and it fails to help > Plan D = Deltasone, take x 6 days if Plan C isn't working  The key is to stop smoking completely before smoking completely stops you!  Please schedule a follow up visit in 3 months but call sooner if needed with drug formulary list of  alternatives    07/04/2017  f/u ov/Wert re: GOLD III and sill smoking - no 02  Chief Complaint  Patient presents with  . Follow-up    Breathing seems worse and he relates this to lack of exercise. He states he checks his o2 sats at home and sometimes he gets readings in the upper 80's. He uses his albuterol inhaler once daily on average but has not needed neb.   Dyspnea:  MMRC3 = can't walk 100 yards even at a slow pace at a flat grade s stopping due to sob and only desats occ at highest levels of activity  Cough: each am and sometimes noct > white mucus Sleep: R side  2 pillows   Has not used Plan B much and none at all C or D  rec The key is to stop smoking completely before smoking completely stops you!  Please schedule a follow up visit in 3 months but call sooner if needed     10/03/2017  f/u ov/Wert re:  GOLD III still smoking  / having trouble affording meds Chief Complaint  Patient presents with  . Follow-up    Increased SOB over the past 2 wks. He is using his  proair 4-5 x per day and neb once daily on average.  He states he gets SOB walking just short distances. He did try round of pred and this helped some. He has been coughing up some clear sputum.    Dyspnea:  Still doe = MMRC3 = can't walk 100 yards even at a slow pace at a flat grade s stopping due to sob   Cough: clear / thick/ rattling worst in am  Sleep: R side / 2 pillows /no 02  SABA use:  Overusing unless on prednisone   No obvious day to day or daytime variability or assoc excess/ purulent sputum or mucus plugs or hemoptysis or cp or chest tightness, subjective wheeze or overt sinus or hb symptoms. No unusual exposure hx or h/o childhood pna/ asthma or knowledge of premature birth.  Sleeping  2 pillows   without nocturnal  or early am exacerbation  of respiratory  c/o's or need for noct saba. Also denies any obvious fluctuation of symptoms with weather or environmental changes or other aggravating or alleviating factors except as outlined above   Current Allergies, Complete Past Medical History, Past Surgical History, Family History, and Social History were reviewed in [employer] record.  ROS  The following are not active complaints unless bolded Hoarseness, sore throat, dysphagia, dental problems, itching, sneezing,  nasal congestion or discharge of excess mucus or purulent secretions, ear ache,   fever, chills, sweats, unintended wt loss or wt gain, classically pleuritic or exertional cp,  orthopnea pnd or arm/hand swelling  or leg swelling, presyncope, palpitations, abdominal pain, anorexia, nausea, vomiting, diarrhea  or change in bowel habits or change in bladder habits, change in stools or change in urine, dysuria, hematuria,  rash, arthralgias, visual complaints, headache, numbness, weakness or ataxia or problems with walking or coordination,  change in mood or  memory.        Current Meds  Medication Sig  . albuterol (PROVENTIL) (2.5 MG/3ML) 0.083% nebulizer  solution USE ONE VIAL IN NEBULIZER EVERY 6 HOURS AS NEEDED FOR WHEEZING OR SHORTNESS OF BREATH  . aspirin 81 MG tablet Take 81 mg by mouth daily.  Marland Kitchen atorvastatin (LIPITOR) 40 MG tablet Take 1 tablet (40 mg total) by mouth daily.  Marland Kitchen b complex  vitamins tablet Take 1 tablet by mouth daily.  . Cyclobenzaprine HCl (FLEXERIL PO) Take 1 tablet by mouth 3 (three) times daily as needed.  . gabapentin (NEURONTIN) 300 MG capsule TAKE 1 CAPSULE BY MOUTH AT BEDTIME FOR  1  WEEK  THEN  INCREASE  TO  1  CAPSULE  TWICE  DAILY  . Glycopyrrolate-Formoterol (BEVESPI AEROSPHERE) 9-4.8 MCG/ACT AERO Inhale 2 puffs every 12 (twelve) hours into the lungs.  Marland Kitchen losartan (COZAAR) 25 MG tablet Take 25 mg by mouth daily.  . Multiple Vitamin (MULTIVITAMIN) capsule Take 1 capsule by mouth daily.  Marland Kitchen oxyCODONE-acetaminophen (PERCOCET/ROXICET) 5-325 MG per tablet Take 1 tablet by mouth every 6 (six) hours as needed.  Marland Kitchen PROAIR HFA 108 (90 Base) MCG/ACT inhaler INHALE 2 PUFFS BY MOUTH EVERY 6 HOURS AS NEEDED FOR WHEEZING OR SHORTNESS OF BREATH  . Sildenafil Citrate (VIAGRA PO) Take 1 tablet by mouth as needed.  . sodium chloride (OCEAN) 0.65 % nasal spray Place 1 spray into the nose as needed for congestion.  . SYMBICORT 160-4.5 MCG/ACT inhaler Inhale 2 puffs into the lungs 2 (two) times daily.                        Objective:   Physical Exam  01/18/2014       179 > 02/07/2015 190 > 03/21/2015   192 > 06/23/2015  196 >   12/30/2015 198 > 03/31/2016 198 >  07/02/2016  201 > 12/30/2016 201 >  04/01/2017   195 >  07/04/2017  201 > 10/03/2017  197     02/09/13 177 lb (80.287 kg)  12/27/12 173 lb 12.8 oz (78.835 kg)    amb wm nad -   Vital signs reviewed - Note on arrival 02 sats  96% on RA           HEENT: nl dentition / oropharynx. Nl external ear canals without cough reflex - moderate bilateral non-specific turbinate edema     NECK :  without JVD/Nodes/TM/ nl carotid upstrokes bilaterally   LUNGS: no acc muscle use,   Mod barrel  contour chest wall with bilateral  Distant pan exp sonorous rhonchi bilaterally and  without cough on insp or exp maneuver and mod   Hyperresonant  to  percussion bilaterally     CV:  RRR  no s3 or murmur or increase in P2, and trace pitting edema both lower ext  ABD:  soft and nontender with pos mid insp Hoover's  in the supine position. No bruits or organomegaly appreciated, bowel sounds nl  MS:   Nl gait/  ext warm without deformities, calf tenderness, cyanosis or clubbing No obvious joint restrictions   SKIN: warm and dry without lesions    NEURO:  alert, approp, nl sensorium with  no motor or cerebellar deficits apparent.                  Assessment & Plan:

## 2017-10-03 NOTE — Assessment & Plan Note (Signed)
4-5 min discussion re active cigarette smoking in addition to office E&M  Ask about tobacco use:  Ongoing  Advise quitting  - I took an extended  opportunity with this patient to outline the consequences of continued cigarette use  in airway disorders based on all the data we have from the multiple national lung health studies (perfomed over decades at millions of dollars in cost)  indicating that smoking cessation, not choice of inhalers or physicians, is the most important aspect of his care Assess willingness not ready Assist in quit attempt when ready  Arrange follow up. Follow up per Primary Care planned

## 2017-10-03 NOTE — Assessment & Plan Note (Signed)
-   02/09/2013  FEV1 2.14 (68%) ratio 60 p 30% better from B2 and dloc 52 corrects to 69 - 02/07/2015  extensive coaching HFA effectiveness =    90% from a baseline of 75%  - 02/07/2015  Walked RA x 3 laps @ 185 ft each stopped due to  End of study, nl pace, no sob or desat   - PFT's  03/21/2015  FEV1 1.92 (62 % ) ratio 60  p 14 % improvement from saba with DLCO  47 % corrects to 62 % for alv volume   - trial of spiriva respimat 03/21/2015  With goal to be able to walk up inclines > no better 06/23/2015 > try bevespi 2 bid x 3 weeks  -sob   Worse p 3 weeks bevespi  so resumed symb/spirva 06/25/2015  - referred to rehab 09/22/2015 >>> d/c'd p 19 sessions on 01/22/16 due to costs - 03/31/2016 rec pred x 6 days if needed  - 12/30/2016    restart spiriva 2.5 x 2 each am > could not afford it  - 04/01/2017    try bevespi  - 07/04/2017    continue beveslop and pred as Plan D  - 10/03/2017  After extensive coaching inhaler device  effectiveness =    90%   Remains on bevespi and cannot afford spiriva but ideally should be on lama/laba/ics so may be a candidate for Trelegy if formulary covers.  In meantime The goal with a chronic steroid dependent illness is always arriving at the lowest effective dose that controls the disease/symptoms and not accepting a set "formula" which is based on statistics or guidelines that don't always take into account patient  variability or the natural hx of the dz in every individual patient, which may well vary over time.  For now therefore I recommend the patient maintain  20 mg per day until better then 10 mg per day and stop.  I had an extended discussion with the patient reviewing all relevant studies completed to date and  lasting 15 to 20 minutes of a 25 minute visit    See device teaching which extended face to face time for this visit   Each maintenance medication was reviewed in detail including most importantly the difference between maintenance and prns and under what  circumstances the prns are to be triggered using an action plan format that is not reflected in the computer generated alphabetically organized AVS.    Please see AVS for specific instructions unique to this visit that I personally wrote and verbalized to the the pt in detail and then reviewed with pt  by my nurse highlighting any  changes in therapy recommended at today's visit to their plan of care.

## 2017-12-19 ENCOUNTER — Other Ambulatory Visit: Payer: Self-pay | Admitting: Internal Medicine

## 2018-01-03 ENCOUNTER — Ambulatory Visit: Payer: Medicare Other | Admitting: Internal Medicine

## 2018-01-03 ENCOUNTER — Encounter: Payer: Self-pay | Admitting: Internal Medicine

## 2018-01-03 VITALS — BP 130/70 | HR 100 | Ht 70.5 in | Wt 206.0 lb

## 2018-01-03 DIAGNOSIS — F17210 Nicotine dependence, cigarettes, uncomplicated: Secondary | ICD-10-CM | POA: Diagnosis not present

## 2018-01-03 DIAGNOSIS — J449 Chronic obstructive pulmonary disease, unspecified: Secondary | ICD-10-CM

## 2018-01-03 MED ORDER — ROFLUMILAST 500 MCG PO TABS: 500.0000 ug | ORAL_TABLET | Freq: Every day | ORAL | 11 refills | Status: DC

## 2018-01-03 NOTE — Patient Instructions (Addendum)
No change in medications except ok to take   prednisone 10 mg 1 daily until better then stay one half daily until return   Try daliresp 250 mg daily with bfast x one month and if helps "congestion" then try the prescription for 500 mg one daily   The key is to stop all  smoking completely before smoking completely stops you!    Please schedule a follow up visit in 3 months but call sooner if needed  - add update cxr on return

## 2018-01-03 NOTE — Progress Notes (Signed)
Subjective:    Patient ID: Joel Perez, male    DOB: 1946-01-12   MRN: [id number]    Brief patient profile:   29 yowm active smoker referred by Dr Moreen Fowler for eval of copd with GOLD II criteria established 02/09/13    History of Present Illness  12/27/2012 1st pulmonary eval/ Wert cc onset x 2009 am cough cough/ congestion and progresive doe with white mucus x sev tbsp x sev min better p proaire and maintained on spiriva which he feels doesn't work as well as advair. Doe x one flight steps, dragging the garbage to street stops x 2. He denies cough to me but actually has a rattling with cough maneuver but doesn't bring up much mucus, what he does bring up is mostly in ams and mucoid and some better since quit smoking  rec symbicort 160 Take 2 puffs first thing in am and then another 2 puffs about 12 hours later.  Only use your albuterol (proaire) as a rescue medication to be used if you can't catch your breath by resting or doing a relaxed purse lip breathing pattern. The less you use it, the better it will work when you need it.  As you improve ok to stop spiriva  Please schedule a follow up office visit in 6 weeks, call sooner if needed with pft's    09/22/2015  f/u ov/Wert re: GOLD III copd/ maint rx = symbicort/ spiriva resp We will be referring you to rehab at [facility]      03/31/2016  f/u ov/Wert re:  GOLD III/ still not smoking cigs/ some ecigs / today took symbicort 2 and spivirva (one puff to make it last)  Chief Complaint  Patient presents with  . Follow-up    Breathing is doing well. He states had to stop attending rehab due to insurance issues. He states he uses rescue inhaler nightly due to wheezing.    at end of rehab able to do 2 mph flat x 15 min but has not continued at home though he does have similar equipment  Using proair 1 hour after symbicort in pm " to break up my night congestion/wheezing" Not taking either symb or spiriva as rec at previous ov ? Due to cost ?  Due to not processing instructions? rec Plan A = Automatic = Symbicort 160 2 pffs first thing in AM  followed by Spriva and another 2 pffs 12 hours later of symbicort only Plan B = Backup Only use your albuterol as a rescue medication to be used if you can't catch your breath by resting or doing a relaxed purse lip breathing pattern.  - The less you use it, the better it will work when you need it. - Ok to use the inhaler up to 2 puffs  every 4 hours if you must but call for appointment if use goes up over your usual need - Don't leave home without it !!  (think of it like the spare tire for your car)  Plan C = Crisis - only use your albuterol nebulizer if you first try Plan B and it fails to help > ok to use the nebulizer up to every 4 hours but if start needing it regularly call for immediate appointment If breathing / wheezing coughing getting worse > Prednisone 10 mg take  4 each am x 2 days,   2 each am x 2 days,  1 each am x 2 days and stop  Try to walk daily  if possible to let me know if you are losing any ground      10/03/2017  f/u ov/Wert re:  GOLD III still smoking  / having trouble affording meds Chief Complaint  Patient presents with  . Follow-up    Increased SOB over the past 2 wks. He is using his proair 4-5 x per day and neb once daily on average.  He states he gets SOB walking just short distances. He did try round of pred and this helped some. He has been coughing up some clear sputum.    Dyspnea:  Still doe = MMRC3 = can't walk 100 yards even at a slow pace at a flat grade s stopping due to sob   Cough: clear / thick/ rattling worst in am  Sleep: R side / 2 pillows /no 02  SABA use:  Overusing unless on prednisone rec Plan A = Automatic =  BEVESP  Take 2 puffs first thing in am and then another 2 puffs about 12 hours later.  Work on maintaining perfect  inhaler technique:   Plan B = Backup Only use your albuterol as a rescue medication Plan C = Crisis - only use your  albuterol nebulizer if you first try Plan B and it fails to help > ok to use the nebulizer up to every 4 hours but if start needing it regularly call for immediate appointment Plan D = Deltasone, take x 2 daily until better then 1 daily x 5 days until better and stop  The key is to stop smoking completely before smoking completely stops you!  Please schedule a follow up visit in 3 months but call sooner if needed  with all medications /inhalers/ solutions in hand so we can verify exactly what you are taking. This includes all medications from all doctors and over the counters     01/03/2018  f/u ov/Wert re:  GOLD III/ still smoking cigars mostly / still trouble affording meds / did not bring them as req Chief Complaint  Patient presents with  . Follow-up    Breathing is about the same. He is using his proair 2 x daily on average and neb at least once per day.   Dyspnea:  Leans on cart at [place] = MMRC3 = can't walk 100 yards even at a slow pace at a flat grade s stopping due to sob   Cough: better since pred rx but misunderstood instructions and takng daily rather than as "plan D"  Sleeping: R side down/ 2 pillows  SABA use: bid avg which is less than prior to prednisone 02: none     No obvious day to day or daytime variability or assoc excess/ purulent sputum or mucus plugs or hemoptysis or cp or chest tightness, subjective wheeze or overt sinus or hb symptoms.   Sleeping ok as above  without nocturnal  or early am exacerbation  of respiratory  c/o's or need for noct saba. Also denies any obvious fluctuation of symptoms with weather or environmental changes or other aggravating or alleviating factors except as outlined above   No unusual exposure hx or h/o childhood pna/ asthma or knowledge of premature birth.  Current Allergies, Complete Past Medical History, Past Surgical History, Family History, and Social History were reviewed in [employer] record.  ROS  The  following are not active complaints unless bolded Hoarseness, sore throat, dysphagia, dental problems, itching, sneezing,  nasal congestion or discharge of excess mucus or purulent secretions, ear ache,  fever, chills, sweats, unintended wt loss or wt gain, classically pleuritic or exertional cp,  orthopnea pnd or arm/hand swelling  or leg swelling, presyncope, palpitations, abdominal pain, anorexia, nausea, vomiting, diarrhea  or change in bowel habits or change in bladder habits, change in stools or change in urine, dysuria, hematuria,  rash, arthralgias, visual complaints, headache, numbness, weakness or ataxia or problems with walking or coordination,  change in mood or  memory.        Current Meds  Medication Sig  . albuterol (PROVENTIL) (2.5 MG/3ML) 0.083% nebulizer solution USE ONE VIAL IN NEBULIZER EVERY 6 HOURS AS NEEDED FOR WHEEZING OR SHORTNESS OF BREATH  . aspirin 81 MG tablet Take 81 mg by mouth daily.  Marland Kitchen b complex vitamins tablet Take 1 tablet by mouth daily.  . Cyclobenzaprine HCl (FLEXERIL PO) Take 1 tablet by mouth 3 (three) times daily as needed.  . gabapentin (NEURONTIN) 300 MG capsule TAKE 1 CAPSULE BY MOUTH AT BEDTIME FOR  1  WEEK  THEN  INCREASE  TO  1  CAPSULE  TWICE  DAILY  . Glycopyrrolate-Formoterol (BEVESPI AEROSPHERE) 9-4.8 MCG/ACT AERO Inhale 2 puffs every 12 (twelve) hours into the lungs.  Marland Kitchen losartan (COZAAR) 25 MG tablet Take 25 mg by mouth daily.  . Multiple Vitamin (MULTIVITAMIN) capsule Take 1 capsule by mouth daily.  Marland Kitchen oxyCODONE-acetaminophen (PERCOCET/ROXICET) 5-325 MG per tablet Take 1 tablet by mouth every 6 (six) hours as needed.  . predniSONE (DELTASONE) 10 MG tablet Take  4 each am x 2 days,   2 each am x 2 days,  1 each am x 2 days and stop  . PROAIR HFA 108 (90 Base) MCG/ACT inhaler INHALE 2 PUFFS BY MOUTH EVERY 6 HOURS AS NEEDED FOR WHEEZING OR SHORTNESS OF BREATH  . Sildenafil Citrate (VIAGRA PO) Take 1 tablet by mouth as needed.  . sodium chloride  (OCEAN) 0.65 % nasal spray Place 1 spray into the nose as needed for congestion.               Objective:   Physical Exam   Obese  01/18/2014       179 > 02/07/2015 190 > 03/21/2015   192 > 06/23/2015  196 >   12/30/2015 198 > 03/31/2016 198 >  07/02/2016  201 > 12/30/2016 201 >  04/01/2017   195 >  07/04/2017  201 > 10/03/2017  197 >   01/03/2018  206     02/09/13 177 lb (80.287 kg)  12/27/12 173 lb 12.8 oz (78.835 kg)       Vital signs reviewed - Note on arrival 02 sats  95% on RA       HEENT:  Very poor lower dentition/ top edentulous/ nl   oropharynx. Nl external ear canals without cough reflex -  Mild bilateral non-specific turbinate edema     NECK :  without JVD/Nodes/TM/ nl carotid upstrokes bilaterally   LUNGS: no acc muscle use,  Mod barrel  contour chest wall with bilateral  Distant bs s audible wheeze and  without cough on insp or exp maneuver and mod  Hyperresonant  to  percussion bilaterally     CV:  RRR  no s3 or murmur or increase in P2, and no edema   ABD:  soft and nontender with pos mid insp Hoover's  in the supine position. No bruits or organomegaly appreciated, bowel sounds nl  MS:   Nl gait/  ext warm without deformities, calf tenderness, cyanosis or clubbing No  obvious joint restrictions   SKIN: warm and dry without lesions    NEURO:  alert, approp, nl sensorium with  no motor or cerebellar deficits apparent.                        Assessment & Plan:

## 2018-01-04 ENCOUNTER — Encounter: Payer: Self-pay | Admitting: Internal Medicine

## 2018-01-04 NOTE — Assessment & Plan Note (Addendum)
4-5 min discussion re active cigarette smoking in addition to office E&M  Ask about tobacco use:   Ongoing cigar use Advise quitting   I emphasized that although we never turn away smokers from the pulmonary clinic, we do ask that they understand that the recommendations that we make  won't work nearly as well in the presence of continued cigar  exposure. In fact, we may very well  reach a point where we can't promise to help the patient if he/she can't quit smoking. (We can and will promise to try to help, we just can't promise what we recommend will really work)  Assess willingness:  Not committed at this point Assist in quit attempt:  Per PCP when ready Arrange follow up:   Follow up per Primary Care planned

## 2018-01-04 NOTE — Assessment & Plan Note (Signed)
-   02/09/2013  FEV1 2.14 (68%) ratio 60 p 30% better from B2 and dloc 52 corrects to 69 - 02/07/2015  extensive coaching HFA effectiveness =    90% from a baseline of 75%  - 02/07/2015  Walked RA x 3 laps @ 185 ft each stopped due to  End of study, nl pace, no sob or desat   - PFT's  03/21/2015  FEV1 1.92 (62 % ) ratio 60  p 14 % improvement from saba with DLCO  47 % corrects to 62 % for alv volume   - trial of spiriva respimat 03/21/2015  With goal to be able to walk up inclines > no better 06/23/2015 > try bevespi 2 bid x 3 weeks  -sob   Worse p 3 weeks bevespi  so resumed symb/spirva 06/25/2015  - referred to rehab 09/22/2015 >>> d/c'd p 19 sessions on 01/22/16 due to costs - 03/31/2016 rec pred x 6 days if needed  - 12/30/2016    restart spiriva 2.5 x 2 each am > could not afford it  - 04/01/2017    try bevespi  - 07/04/2017    continue beveslop and pred as Plan D but misunderstood and took daily  - 01/03/2018  After extensive coaching inhaler device  effectiveness =    90%  - 01/03/2018 daliresp trial in effort to reduced pred dependency  Clearly  Group D in terms of symptom/risk and laba/lama/ICS  therefore appropriate rx at this point and remains steroid dep as well so reasonable to try daliresp if can tol gi and cost issues   Discussed in detail all the  indications, usual  risks and alternatives  relative to the benefits with patient who agrees to proceed with rx as outlined.     I had an extended discussion with the patient reviewing all relevant studies completed to date and  lasting 15 to 20 minutes of a 25 minute visit    See device teaching which extended face to face time for this visit.  Each maintenance medication was reviewed in detail including emphasizing most importantly the difference between maintenance and prns and under what circumstances the prns are to be triggered using an action plan format that is not reflected in the computer generated alphabetically organized AVS which I have  not found useful in most complex patients, especially with respiratory illnesses  Please see AVS for specific instructions unique to this visit that I personally wrote and verbalized to the the pt in detail and then reviewed with pt  by my nurse highlighting any  changes in therapy recommended at today's visit to their plan of care.

## 2018-01-31 ENCOUNTER — Other Ambulatory Visit: Payer: Self-pay | Admitting: Cardiovascular Disease

## 2018-01-31 DIAGNOSIS — I739 Peripheral vascular disease, unspecified: Secondary | ICD-10-CM

## 2018-01-31 NOTE — Telephone Encounter (Signed)
Refill Request.  

## 2018-02-07 ENCOUNTER — Telehealth: Payer: Self-pay | Admitting: Internal Medicine

## 2018-02-07 NOTE — Telephone Encounter (Signed)
PA request received from [facility] for [place]er 579mcg CMM Key: [id number] PA request has been sent to plan, and a determination is expected within 2 days.   Routing to [place] for follow-up.

## 2018-02-08 NOTE — Telephone Encounter (Signed)
The PA for Daliresp 500 mcg has been approved through 05/03/2018 under Medicare Part D.  Pt [id number]  [id number]  Vladimir Faster on [place] was notified of approval. Nothing further is needed.   LM to advise pt that the medication was approved.

## 2018-02-14 ENCOUNTER — Encounter: Payer: Self-pay | Admitting: Cardiovascular Disease

## 2018-02-14 ENCOUNTER — Ambulatory Visit: Payer: Medicare Other | Admitting: Cardiovascular Disease

## 2018-02-14 VITALS — BP 132/70 | HR 93 | Ht 70.0 in | Wt 205.0 lb

## 2018-02-14 DIAGNOSIS — E785 Hyperlipidemia, unspecified: Secondary | ICD-10-CM

## 2018-02-14 DIAGNOSIS — Z720 Tobacco use: Secondary | ICD-10-CM | POA: Diagnosis not present

## 2018-02-14 DIAGNOSIS — I10 Essential (primary) hypertension: Secondary | ICD-10-CM | POA: Diagnosis not present

## 2018-02-14 DIAGNOSIS — I351 Nonrheumatic aortic (valve) insufficiency: Secondary | ICD-10-CM

## 2018-02-14 DIAGNOSIS — I739 Peripheral vascular disease, unspecified: Secondary | ICD-10-CM | POA: Diagnosis not present

## 2018-02-14 NOTE — Patient Instructions (Signed)
Medication Instructions:  Your physician recommends that you continue on your current medications as directed. Please refer to the Current Medication list given to you today.   Labwork: none  Testing/Procedures: none  Follow-Up: Your physician wants you to follow-up in: 12 months with Dr. Arida. You will receive a reminder letter in the mail two months in advance. If you don't receive a letter, please call our office to schedule the follow-up appointment.   Any Other Special Instructions Will Be Listed Below (If Applicable).     If you need a refill on your cardiac medications before your next appointment, please call your pharmacy.  

## 2018-02-14 NOTE — Progress Notes (Signed)
Cardiology Office Note   Date:  02/14/2018   ID:  Joel Perez, DOB February 16, 1946, MRN [id number]  PCP:  [name], MD  Cardiologist:  Dr. Fletcher Anon No chief complaint on file.     History of Present Illness: Joel Perez is a 72 y.o. male who is here today for follow-up visit regarding peripheral arterial disease. He has known history of chronic diastolic heart failure, tobacco use, aortic valve disease and COPD. Echocardiogram showed normal LV systolic function with moderate LVH, mild to moderate aortic regurgitation with no significant stenosis. He is known to have leg edema thought to be due to chronic venous insufficiency. Venous Doppler was unremarkable. He was seen last year for mild bilateral leg claudication with symptoms of neuropathy as well.   He underwent lower extremity arterial Doppler which showed normal ABI bilaterally. Duplex showed moderate bilateral iliac disease and moderate bilateral common femoral artery disease with mild SFA disease and three-vessel runoff bilaterally. He was treating medically for peripheral arterial disease.  During last visit, I prescribed Chantix for smoking cessation. He also had a rash on the right foot that was treated by dermatology. Unfortunately, the patient could not quit smoking with Chantix.  He continues to smoke about 10 cigarettes a day.  He denies any chest pain.  He has chronic exertional dyspnea related to COPD and smoking.   Past Medical History:  Diagnosis Date  . 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 BPH (benign prostatic hyperplasia)   . Capsular cataract of left eye   . COPD (chronic obstructive pulmonary disease) (HCC)    Dr. [name]  . Current smoker    Long-term smoker. Not interested in quitting  . Diverticulosis    With intermittent diverticulitis  . Erectile dysfunction   . Essential hypertension    Several recorded blood pressures greater than 140/90.  . H/O mitral valve prolapse    By report,  but not confirmed by echo.  . Low back pain    Chronic. Followed by Dr. Suella Broad  . Mild aortic regurgitation 12/2013   Mild to moderate aortic regurgitation on echo  . Neuropathy     Past Surgical History:  Procedure Laterality Date  . COLON SURGERY  2000  . ELBOW SURGERY    . TRANSTHORACIC ECHOCARDIOGRAM  12/29/2016   EF 60-65%. Moderate LVH. Mild aortic valve calcification. Mild to moderate regurgitation with no stenosis. Mitral valve annular calcification with no comment of prolapse or regurgitation.     Current Outpatient Medications  Medication Sig Dispense Refill  . albuterol (PROVENTIL) (2.5 MG/3ML) 0.083% nebulizer solution USE ONE VIAL IN NEBULIZER EVERY 6 HOURS AS NEEDED FOR WHEEZING OR SHORTNESS OF BREATH 75 vial 11  . aspirin 81 MG tablet Take 81 mg by mouth daily.    Marland Kitchen atorvastatin (LIPITOR) 40 MG tablet Take 1 tablet (40 mg total) by mouth daily. 90 tablet 1  . b complex vitamins tablet Take 1 tablet by mouth daily.    . Cyclobenzaprine HCl (FLEXERIL PO) Take 1 tablet by mouth 3 (three) times daily as needed.    . gabapentin (NEURONTIN) 300 MG capsule TAKE 1 CAPSULE BY MOUTH AT BEDTIME FOR  1  WEEK  THEN  INCREASE  TO  1  CAPSULE  TWICE  DAILY 180 capsule 0  . Glycopyrrolate-Formoterol (BEVESPI AEROSPHERE) 9-4.8 MCG/ACT AERO Inhale 2 puffs every 12 (twelve) hours into the lungs. 1 Inhaler 11  . losartan (COZAAR) 25 MG tablet  Take 25 mg by mouth daily.    . Multiple Vitamin (MULTIVITAMIN) capsule Take 1 capsule by mouth daily.    Marland Kitchen oxyCODONE-acetaminophen (PERCOCET/ROXICET) 5-325 MG per tablet Take 1 tablet by mouth every 6 (six) hours as needed.    . predniSONE (DELTASONE) 10 MG tablet Take  4 each am x 2 days,   2 each am x 2 days,  1 each am x 2 days and stop 100 tablet 1  . PROAIR HFA 108 (90 Base) MCG/ACT inhaler INHALE 2 PUFFS BY MOUTH EVERY 6 HOURS AS NEEDED FOR WHEEZING OR SHORTNESS OF BREATH 1 each 1  . roflumilast (DALIRESP) 500 MCG TABS tablet Take 1  tablet (500 mcg total) by mouth daily. 30 tablet 11  . Sildenafil Citrate (VIAGRA PO) Take 1 tablet by mouth as needed.    . sodium chloride (OCEAN) 0.65 % nasal spray Place 1 spray into the nose as needed for congestion.     No current facility-administered medications for this visit.     Allergies:   Codeine and Sulfa antibiotics    Social History:  The patient  reports that he has been smoking cigarettes. He has a 100.00 pack-year smoking history. He has never used smokeless tobacco. He reports that he drinks alcohol. He reports that he does not use drugs.   Family History:  The patient's family history includes Brain cancer in his brother; Breast cancer in his mother; Heart disease in his father; Other in his sister.    ROS:  Please see the history of present illness.   Otherwise, review of systems are positive for none.   All other systems are reviewed and negative.    PHYSICAL EXAM: VS:  BP 132/70   Pulse 93   Ht 5\' 10"  (1.778 m)   Wt 205 lb (93 kg)   BMI 29.41 kg/m  , BMI Body mass index is 29.41 kg/m. GEN: Well nourished, well developed, in no acute distress  HEENT: normal  Neck: no JVD, carotid bruits, or masses Cardiac: RRR; no  rubs, or gallops. There is one out of 6 systolic ejection murmur in the aortic area. There is mild to moderate bilateral leg edema Respiratory:  clear to auscultation bilaterally, normal work of breathing GI: soft, nontender, nondistended, + BS MS: no deformity or atrophy  Skin: warm and dry, no rash Neuro:  Strength and sensation are intact Psych: euthymic mood, full affect Vascular: Femoral pulses are normal bilaterally.  Posterior tibial pulses normal bilaterally.  EKG:  EKG is ordered today. EKG showed normal sinus rhythm with PACs.  Poor R wave progression in the anterior leads.   Recent Labs: 04/08/2017: ALT 26    Lipid Panel    Component Value Date/Time   CHOL 154 04/08/2017 1504   TRIG 114 04/08/2017 1504   HDL 48  04/08/2017 1504   CHOLHDL 3.2 04/08/2017 1504   LDLCALC 83 04/08/2017 1504      Wt Readings from Last 3 Encounters:  02/14/18 205 lb (93 kg)  01/03/18 206 lb (93.4 kg)  10/03/17 197 lb (89.4 kg)       PAD Screen 01/17/2017  Previous surgical procedure? Yes  Dates of procedures colon ressection  Pain with walking? Yes  Subsides with rest? Yes  Painful, non-healing ulcers? Yes  Extremities discolored? Yes      ASSESSMENT AND PLAN:  1.  Peripheral arterial disease:The patient has mild to moderate diffuse peripheral arterial disease with no evidence of obstructive stenosis.  He continues to  have palpable posterior tibial pulses bilaterally.  His symptoms are stable.  Continue medical therapy.  Continue low-dose aspirin.  2. Tobacco use: I prescribed Chantix during last visit but he could not quit smoking.  I discussed with him the importance of smoking cessation.  3. Hyperlipidemia: Continue treatment with atorvastatin.  Most recent LDL was 83.  4.  Essential hypertension: Blood pressure is controlled on current medications.  5.  Mild to moderate aortic regurgitation: Repeat echocardiogram in 2 years.   Disposition:   FU with me in 12 months  Signed,  Kathlyn Sacramento, MD  02/14/2018 8:16 AM    [place]

## 2018-04-05 ENCOUNTER — Ambulatory Visit (INDEPENDENT_AMBULATORY_CARE_PROVIDER_SITE_OTHER)
Admission: RE | Admit: 2018-04-05 | Discharge: 2018-04-05 | Disposition: A | Discharge status: Home / Self Care | Payer: Medicare Other | Source: Ambulatory Visit | Attending: Internal Medicine | Admitting: Internal Medicine

## 2018-04-05 ENCOUNTER — Ambulatory Visit: Payer: Medicare Other | Admitting: Internal Medicine

## 2018-04-05 ENCOUNTER — Encounter: Payer: Self-pay | Admitting: Internal Medicine

## 2018-04-05 VITALS — BP 130/80 | HR 88 | Temp 97.7°F | Ht 70.0 in | Wt 205.0 lb

## 2018-04-05 DIAGNOSIS — J449 Chronic obstructive pulmonary disease, unspecified: Secondary | ICD-10-CM

## 2018-04-05 DIAGNOSIS — J9611 Chronic respiratory failure with hypoxia: Secondary | ICD-10-CM

## 2018-04-05 DIAGNOSIS — Z23 Encounter for immunization: Secondary | ICD-10-CM

## 2018-04-05 MED ORDER — PREDNISONE 10 MG PO TABS: ORAL_TABLET | ORAL | 0 refills | Status: DC

## 2018-04-05 NOTE — Patient Instructions (Addendum)
Prednisone 10 mg   2 daily until better then 1 daily x 5 days and then one half daily x 5 days and stop - start over if breathing worsens    Continue bevespi  Take 2 puffs first thing in am and then another 2 puffs about 12 hours later.   Only use your albuterol as a rescue medication to be used if you can't catch your breath by resting or doing a relaxed purse lip breathing pattern.  - The less you use it, the better it will work when you need it. - Ok to use up to 2 puffs  every 4 hours if you must but call for immediate appointment if use goes up over your usual need - Don't leave home without it !!  (think of it like the spare tire for your car)   Adjust 02 to keep sat > 90% while walking    Please remember to go to the  x-ray department downstairs in the basement  for your tests - we will call you with the results when they are available.      Please schedule a follow up office visit in 6 weeks, call sooner if needed with pfts on return - please bring your concentrator with you so we can check it - alpha one screening not done

## 2018-04-05 NOTE — Progress Notes (Signed)
Subjective:    Patient ID: Joel Perez, male    DOB: 1946-01-12   MRN: [id number]    Brief patient profile:   29 yowm active smoker referred by Dr Moreen Fowler for eval of copd with GOLD II criteria established 02/09/13    History of Present Illness  12/27/2012 1st pulmonary eval/  cc onset x 2009 am cough cough/ congestion and progresive doe with white mucus x sev tbsp x sev min better p proaire and maintained on spiriva which he feels doesn't work as well as advair. Doe x one flight steps, dragging the garbage to street stops x 2. He denies cough to me but actually has a rattling with cough maneuver but doesn't bring up much mucus, what he does bring up is mostly in ams and mucoid and some better since quit smoking  rec symbicort 160 Take 2 puffs first thing in am and then another 2 puffs about 12 hours later.  Only use your albuterol (proaire) as a rescue medication to be used if you can't catch your breath by resting or doing a relaxed purse lip breathing pattern. The less you use it, the better it will work when you need it.  As you improve ok to stop spiriva  Please schedule a follow up office visit in 6 weeks, call sooner if needed with pft's    09/22/2015  f/u ov/ re: GOLD III copd/ maint rx = symbicort/ spiriva resp We will be referring you to rehab at [facility]      03/31/2016  f/u ov/ re:  GOLD III/ still not smoking cigs/ some ecigs / today took symbicort 2 and spivirva (one puff to make it last)  Chief Complaint  Patient presents with  . Follow-up    Breathing is doing well. He states had to stop attending rehab due to insurance issues. He states he uses rescue inhaler nightly due to wheezing.    at end of rehab able to do 2 mph flat x 15 min but has not continued at home though he does have similar equipment  Using proair 1 hour after symbicort in pm " to break up my night congestion/wheezing" Not taking either symb or spiriva as rec at previous ov ? Due to cost ?  Due to not processing instructions? rec Plan A = Automatic = Symbicort 160 2 pffs first thing in AM  followed by Spriva and another 2 pffs 12 hours later of symbicort only Plan B = Backup Only use your albuterol as a rescue medication to be used if you can't catch your breath by resting or doing a relaxed purse lip breathing pattern.  - The less you use it, the better it will work when you need it. - Ok to use the inhaler up to 2 puffs  every 4 hours if you must but call for appointment if use goes up over your usual need - Don't leave home without it !!  (think of it like the spare tire for your car)  Plan C = Crisis - only use your albuterol nebulizer if you first try Plan B and it fails to help > ok to use the nebulizer up to every 4 hours but if start needing it regularly call for immediate appointment If breathing / wheezing coughing getting worse > Prednisone 10 mg take  4 each am x 2 days,   2 each am x 2 days,  1 each am x 2 days and stop  Try to walk daily  if possible to let me know if you are losing any ground      10/03/2017  f/u ov/ re:  GOLD III still smoking  / having trouble affording meds Chief Complaint  Patient presents with  . Follow-up    Increased SOB over the past 2 wks. He is using his proair 4-5 x per day and neb once daily on average.  He states he gets SOB walking just short distances. He did try round of pred and this helped some. He has been coughing up some clear sputum.    Dyspnea:  Still doe = MMRC3 = can't walk 100 yards even at a slow pace at a flat grade s stopping due to sob   Cough: clear / thick/ rattling worst in am  Sleep: R side / 2 pillows /no 02  SABA use:  Overusing unless on prednisone rec Plan A = Automatic =  BEVESP  Take 2 puffs first thing in am and then another 2 puffs about 12 hours later.  Work on maintaining perfect  inhaler technique:   Plan B = Backup Only use your albuterol as a rescue medication Plan C = Crisis - only use your  albuterol nebulizer if you first try Plan B and it fails to help > ok to use the nebulizer up to every 4 hours but if start needing it regularly call for immediate appointment Plan D = Deltasone, take x 2 daily until better then 1 daily x 5 days until better and stop  The key is to stop smoking completely before smoking completely stops you!  Please schedule a follow up visit in 3 months but call sooner if needed  with all medications /inhalers/ solutions in hand so we can verify exactly what you are taking. This includes all medications from all doctors and over the counters     01/03/2018  f/u ov/ re:  GOLD III/ still smoking cigars mostly / still trouble affording meds / did not bring them as req Chief Complaint  Patient presents with  . Follow-up    Breathing is about the same. He is using his proair 2 x daily on average and neb at least once per day.   Dyspnea:  Leans on cart at [place] = MMRC3 = can't walk 100 yards even at a slow pace at a flat grade s stopping due to sob   Cough: better since pred rx but misunderstood instructions and takng daily rather than as "plan D"  Sleeping: R side down/ 2 pillows  SABA use: bid avg which is less than prior to prednisone 02: none   rec No change in medications except ok to take   prednisone 10 mg 1 daily until better then stay one half daily until return Try daliresp 250 mg daily with bfast x one month and if helps "congestion" then try the prescription for 500 mg one daily  The key is to stop all  smoking completely before smoking completely stops you!  Please schedule a follow up visit in 3 months but call sooner if needed  - add update cxr on return    04/05/2018  f/u ov/ re: copd III/ stopped smoking Oct 2019 / could not afford daliresp/ did not continue pred Chief Complaint  Patient presents with  . Follow-up    getting over a "chest cold" that he had a month ago. He is coughing up some light yellow sputum.  He has had a slight  increase in his SOB.  Dyspnea:  Walking at [employer] once a week, not checking sats/ using 02 after finish Cough: was worse x 03/13/18 seems to be getting better on its own Sleeping: bed is flat, several pillows  SABA use: 5-6 x day and neb sev x per week  02: bought his own POC, doesn't think it works- just uses p ex, not during, does not monitor sats with activity on or off POC/ no noct 02   No obvious day to day or daytime variability or assoc excess/ purulent sputum or mucus plugs or hemoptysis or cp or chest tightness, subjective wheeze or overt sinus or hb symptoms.   Sleeping ok  without nocturnal  or early am exacerbation  of respiratory  c/o's or need for noct saba. Also denies any obvious fluctuation of symptoms with weather or environmental changes or other aggravating or alleviating factors except as outlined above   No unusual exposure hx or h/o childhood pna/ asthma or knowledge of premature birth.  Current Allergies, Complete Past Medical History, Past Surgical History, Family History, and Social History were reviewed in [employer] record.  ROS  The following are not active complaints unless bolded Hoarseness, sore throat, dysphagia, dental problems, itching, sneezing,  nasal congestion or discharge of excess mucus or purulent secretions, ear ache,   fever, chills, sweats, unintended wt loss or wt gain, classically pleuritic or exertional cp,  orthopnea pnd or arm/hand swelling  or leg swelling, presyncope, palpitations, abdominal pain, anorexia, nausea, vomiting, diarrhea  or change in bowel habits or change in bladder habits, change in stools or change in urine, dysuria, hematuria,  rash, arthralgias, visual complaints, headache, numbness, weakness or ataxia or problems with walking or coordination,  change in mood or  memory.        Current Meds  Medication Sig  . albuterol (PROVENTIL) (2.5 MG/3ML) 0.083% nebulizer solution USE ONE VIAL IN NEBULIZER EVERY  6 HOURS AS NEEDED FOR WHEEZING OR SHORTNESS OF BREATH  . aspirin 81 MG tablet Take 81 mg by mouth daily.  Marland Kitchen atorvastatin (LIPITOR) 40 MG tablet Take 1 tablet (40 mg total) by mouth daily.  Marland Kitchen b complex vitamins tablet Take 1 tablet by mouth daily.  . Cyclobenzaprine HCl (FLEXERIL PO) Take 1 tablet by mouth 3 (three) times daily as needed.  . gabapentin (NEURONTIN) 300 MG capsule TAKE 1 CAPSULE BY MOUTH AT BEDTIME FOR  1  WEEK  THEN  INCREASE  TO  1  CAPSULE  TWICE  DAILY  . Glycopyrrolate-Formoterol (BEVESPI AEROSPHERE) 9-4.8 MCG/ACT AERO Inhale 2 puffs every 12 (twelve) hours into the lungs.  Marland Kitchen losartan (COZAAR) 25 MG tablet Take 25 mg by mouth daily.  . Multiple Vitamin (MULTIVITAMIN) capsule Take 1 capsule by mouth daily.  Marland Kitchen oxyCODONE-acetaminophen (PERCOCET/ROXICET) 5-325 MG per tablet Take 1 tablet by mouth every 6 (six) hours as needed.  Marland Kitchen PROAIR HFA 108 (90 Base) MCG/ACT inhaler INHALE 2 PUFFS BY MOUTH EVERY 6 HOURS AS NEEDED FOR WHEEZING OR SHORTNESS OF BREATH  . Sildenafil Citrate (VIAGRA PO) Take 1 tablet by mouth as needed.  . sodium chloride (OCEAN) 0.65 % nasal spray Place 1 spray into the nose as needed for congestion.                  Objective:   Physical Exam    obese wm nad   01/18/2014       179 > 02/07/2015 190 > 03/21/2015   192 > 06/23/2015  196 >  12/30/2015 198 > 03/31/2016 198 >  07/02/2016  201 > 12/30/2016 201 >  04/01/2017   195 >  07/04/2017  201 > 10/03/2017  197 >   01/03/2018  206 > 04/05/2018    206     02/09/13 177 lb (80.287 kg)  12/27/12 173 lb 12.8 oz (78.835 kg)      Vital signs reviewed - Note on arrival 02 sats  90% on RA       HEENT: Poor  dentition / nl oropharynx. Nl external ear canals without cough reflex -  Mild bilateral non-specific turbinate edema     NECK :  without JVD/Nodes/TM/ nl carotid upstrokes bilaterally   LUNGS: no acc muscle use,  Mod barrel  contour chest wall with bilateral  Distant  Exp rhonchi without cough on insp or exp  maneuver and mod  Hyperresonant  to  percussion bilaterally     CV:  RRR  no s3 or murmur or increase in P2, and no edema   ABD:  soft and nontender with pos mid insp Hoover's  in the supine position. No bruits or organomegaly appreciated, bowel sounds nl  MS:   Nl gait/  ext warm without deformities, calf tenderness, cyanosis or clubbing No obvious joint restrictions   SKIN: warm and dry without lesions    NEURO:  alert, approp, nl sensorium with  no motor or cerebellar deficits apparent.         CXR PA and Lateral:   04/05/2018 :    I personally reviewed images and agree with radiology impression as follows:   Hyperexpansion with underlying chronic interstitial changes. No interval change.       Assessment & Plan:

## 2018-04-06 ENCOUNTER — Encounter: Payer: Self-pay | Admitting: Internal Medicine

## 2018-04-06 DIAGNOSIS — J9611 Chronic respiratory failure with hypoxia: Secondary | ICD-10-CM | POA: Insufficient documentation

## 2018-04-06 NOTE — Assessment & Plan Note (Addendum)
-   02/09/2013  FEV1 2.14 (68%) ratio 60 p 30% better from B2 and dloc 52 corrects to 69 - 02/07/2015  extensive coaching HFA effectiveness =    90% from a baseline of 75%  - 02/07/2015  Walked RA x 3 laps @ 185 ft each stopped due to  End of study, nl pace, no sob or desat   - PFT's  03/21/2015  FEV1 1.92 (62 % ) ratio 60  p 14 % improvement from saba with DLCO  47 % corrects to 62 % for alv volume   - trial of spiriva respimat 03/21/2015  With goal to be able to walk up inclines > no better 06/23/2015 > try bevespi 2 bid x 3 weeks  -sob   Worse p 3 weeks bevespi  so resumed symb/spirva 06/25/2015  - referred to rehab 09/22/2015 >>> d/c'd p 19 sessions on 01/22/16 due to costs - 03/31/2016 rec pred x 6 days if needed  - 12/30/2016    restart spiriva 2.5 x 2 each am > could not afford it  - 04/01/2017    try bevespi  - 07/04/2017    continue beveslop and pred as Plan D but misunderstood and took daily  - 01/03/2018  After extensive coaching inhaler device  effectiveness =    90% - 01/03/2018 daliresp trial in effort to reduced pred dependency > could not afford     Group D in terms of symptom/risk and laba/lama/ICS  therefore appropriate rx at this point but having trouble affording all 3 so reluctantly continuing to use prednisone short course for flares.  The goal with a chronic steroid dependent illness is always arriving at the lowest effective dose that controls the disease/symptoms and not accepting a set "formula" which is based on statistics or guidelines that don't always take into account patient  variability or the natural hx of the dz in every individual patient, which may well vary over time.  For now therefore I recommend the patient maintain  20 mg ceiling then taper off x 10 days as "Plan D "  On his action plan   Did advise him that if continues off cigs may see substantial difference in his symptoms over next 6 weeks > return for pfts and also alpha one screening as not in epic records.

## 2018-04-06 NOTE — Assessment & Plan Note (Signed)
Reports desats on exertion so purchased inogen on his own and uses it after he exerts   Advised needs to titrate the flow to keep sats > 90% while walking and bring the machine here next time so we can walk him on it to assure it's adequate but no need for 02 at rest at this point and hope this issue resolves with smoking abstinence   I had an extended discussion with the patient reviewing all relevant studies completed to date and  lasting 15 to 20 minutes of a 25 minute visit    Each maintenance medication was reviewed in detail including most importantly the difference between maintenance and prns and under what circumstances the prns are to be triggered using an action plan format that is not reflected in the computer generated alphabetically organized AVS.     Please see AVS for specific instructions unique to this visit that I personally wrote and verbalized to the the pt in detail and then reviewed with pt  by my nurse highlighting any  changes in therapy recommended at today's visit to their plan of care.

## 2018-04-06 NOTE — Progress Notes (Signed)
Spoke with pt and notified of results per Dr. Wert. Pt verbalized understanding and denied any questions. 

## 2018-05-02 ENCOUNTER — Other Ambulatory Visit: Payer: Self-pay | Admitting: Internal Medicine

## 2018-05-03 ENCOUNTER — Other Ambulatory Visit: Payer: Self-pay | Admitting: Family Medicine

## 2018-05-03 DIAGNOSIS — Z136 Encounter for screening for cardiovascular disorders: Secondary | ICD-10-CM

## 2018-05-18 ENCOUNTER — Other Ambulatory Visit: Payer: Self-pay | Admitting: Internal Medicine

## 2018-05-23 ENCOUNTER — Ambulatory Visit: Payer: Medicare Other | Admitting: Internal Medicine

## 2018-05-23 ENCOUNTER — Encounter: Payer: Self-pay | Admitting: Internal Medicine

## 2018-05-23 VITALS — BP 118/60 | HR 97 | Ht 70.0 in | Wt 211.0 lb

## 2018-05-23 DIAGNOSIS — J449 Chronic obstructive pulmonary disease, unspecified: Secondary | ICD-10-CM

## 2018-05-23 DIAGNOSIS — J9611 Chronic respiratory failure with hypoxia: Secondary | ICD-10-CM

## 2018-05-23 MED ORDER — BUDESONIDE-FORMOTEROL FUMARATE 160-4.5 MCG/ACT IN AERO: INHALATION_SPRAY | RESPIRATORY_TRACT | 12 refills | Status: DC

## 2018-05-23 NOTE — Progress Notes (Signed)
Subjective:   Patient ID: Joel Perez, male    DOB: 08-01-1945   MRN: [id number]    Brief patient profile:  42 yowm quit smoking 02/2018  referred by Dr Moreen Fowler for eval of copd with GOLD II criteria established 02/09/13    History of Present Illness  12/27/2012 1st pulmonary eval/ Wert cc onset x 2009 am cough cough/ congestion and progresive doe with white mucus x sev tbsp x sev min better p proaire and maintained on spiriva which he feels doesn't work as well as advair. Doe x one flight steps, dragging the garbage to street stops x 2. He denies cough to me but actually has a rattling with cough maneuver but doesn't bring up much mucus, what he does bring up is mostly in ams and mucoid and some better since quit smoking  rec symbicort 160 Take 2 puffs first thing in am and then another 2 puffs about 12 hours later.  Only use your albuterol (proaire) as a rescue medication to be used if you can't catch your breath by resting or doing a relaxed purse lip breathing pattern. The less you use it, the better it will work when you need it.  As you improve ok to stop spiriva  Please schedule a follow up office visit in 6 weeks, call sooner if needed with pft's    09/22/2015  f/u ov/Wert re: GOLD III copd/ maint rx = symbicort/ spiriva resp We will be referring you to rehab at [facility]      03/31/2016  f/u ov/Wert re:  GOLD III/ still not smoking cigs/ some ecigs / today took symbicort 2 and spivirva (one puff to make it last)  Chief Complaint  Patient presents with  . Follow-up    Breathing is doing well. He states had to stop attending rehab due to insurance issues. He states he uses rescue inhaler nightly due to wheezing.    at end of rehab able to do 2 mph flat x 15 min but has not continued at home though he does have similar equipment  Using proair 1 hour after symbicort in pm " to break up my night congestion/wheezing" Not taking either symb or spiriva as rec at previous ov ? Due to cost  ? Due to not processing instructions? rec Plan A = Automatic = Symbicort 160 2 pffs first thing in AM  followed by [facility] and another 2 pffs 12 hours later of symbicort only Plan B = Backup Only use your albuterol as a rescue medication to be used if you can't catch your breath by resting or doing a relaxed purse lip breathing pattern.  - The less you use it, the better it will work when you need it. - Ok to use the inhaler up to 2 puffs  every 4 hours if you must but call for appointment if use goes up over your usual need - Don't leave home without it !!  (think of it like the spare tire for your car)  Plan C = Crisis - only use your albuterol nebulizer if you first try Plan B and it fails to help > ok to use the nebulizer up to every 4 hours but if start needing it regularly call for immediate appointment If breathing / wheezing coughing getting worse > Prednisone 10 mg take  4 each am x 2 days,   2 each am x 2 days,  1 each am x 2 days and stop  Try to walk daily  if possible to let me know if you are losing any ground      10/03/2017  f/u ov/Wert re:  GOLD III still smoking  / having trouble affording meds Chief Complaint  Patient presents with  . Follow-up    Increased SOB over the past 2 wks. He is using his proair 4-5 x per day and neb once daily on average.  He states he gets SOB walking just short distances. He did try round of pred and this helped some. He has been coughing up some clear sputum.    Dyspnea:  Still doe = MMRC3 = can't walk 100 yards even at a slow pace at a flat grade s stopping due to sob   Cough: clear / thick/ rattling worst in am  Sleep: R side / 2 pillows /no 02  SABA use:  Overusing unless on prednisone rec Plan A = Automatic =  BEVESP  Take 2 puffs first thing in am and then another 2 puffs about 12 hours later.  Work on maintaining perfect  inhaler technique:   Plan B = Backup Only use your albuterol as a rescue medication Plan C = Crisis - only use  your albuterol nebulizer if you first try Plan B and it fails to help > ok to use the nebulizer up to every 4 hours but if start needing it regularly call for immediate appointment Plan D = Deltasone, take x 2 daily until better then 1 daily x 5 days until better and stop  The key is to stop smoking completely before smoking completely stops you!  Please schedule a follow up visit in 3 months but call sooner if needed  with all medications /inhalers/ solutions in hand so we can verify exactly what you are taking. This includes all medications from all doctors and over the counters     01/03/2018  f/u ov/Wert re:  GOLD III/ still smoking cigars mostly / still trouble affording meds / did not bring them as req Chief Complaint  Patient presents with  . Follow-up    Breathing is about the same. He is using his proair 2 x daily on average and neb at least once per day.   Dyspnea:  Leans on cart at [place] = MMRC3 = can't walk 100 yards even at a slow pace at a flat grade s stopping due to sob   Cough: better since pred rx but misunderstood instructions and takng daily rather than as "plan D"  Sleeping: R side down/ 2 pillows  SABA use: bid avg which is less than prior to prednisone 02: none   rec No change in medications except ok to take   prednisone 10 mg 1 daily until better then stay one half daily until return Try daliresp 250 mg daily with bfast x one month and if helps "congestion" then try the prescription for 500 mg one daily > did not help, could not afford it The key is to stop all  smoking completely before smoking completely stops you!          04/05/2018  f/u ov/Wert re: copd III/ stopped smoking Oct 2019 / could not afford daliresp/ did not continue pred Chief Complaint  Patient presents with  . Follow-up    getting over a "chest cold" that he had a month ago. He is coughing up some light yellow sputum.  He has had a slight increase in his SOB.  Dyspnea:  Walking at [employer]  once a week, not  checking sats/ using 02 after finish Cough: was worse x 03/13/18 seems to be getting better on its own Sleeping: bed is flat, several pillows  SABA use: 5-6 x day and neb sev x per week  02: bought his own POC, doesn't think it works- just uses p ex, not during, does not monitor sats with activity on or off POC/ no noct 02 rec Prednisone 10 mg   2 daily until better then 1 daily x 5 days and then one half daily x 5 days and stop - start over if breathing worsens  Continue bevespi  Take 2 puffs first thing in am and then another 2 puffs about 12 hours later.  Only use your albuterol as a rescue medication Adjust 02 to keep sat > 90% while walking  Please schedule a follow up office visit in 6 weeks, call sooner if needed with pfts on return - please bring your concentrator with you so we can check it - alpha one screening not done     05/23/2018  f/u ov/Wert re:  Copd  GOLD  III maint bevespi and prn pred Chief Complaint  Patient presents with  . Follow-up    Breathing is about the same. He is using his proair inhaler 2 x daily on average and neb about 3 x per wk.   Dyspnea:  Walking at [employer]/ back on treadmill x 20 min x slow speed x flat 3 days a week  Cough: not problem but has sense of throat congestion  Sleeping: bed is flat  SABA use: twice daily when over does it  02: only with activity uses 3lpm per inogen he purchased/ says not adjustable  Does much better while on prednisone than off    No obvious day to day or daytime variability or assoc excess/ purulent sputum or mucus plugs or hemoptysis or cp or chest tightness, subjective wheeze or overt sinus or hb symptoms.   Sleeping as above  without nocturnal  or early am exacerbation  of respiratory  c/o's or need for noct saba. Also denies any obvious fluctuation of symptoms with weather or environmental changes or other aggravating or alleviating factors except as outlined above   No unusual exposure hx or  h/o childhood pna/ asthma or knowledge of premature birth.  Current Allergies, Complete Past Medical History, Past Surgical History, Family History, and Social History were reviewed in [employer] record.  ROS  The following are not active complaints unless bolded Hoarseness, sore throat, dysphagia, dental problems, itching, sneezing,  nasal congestion or discharge of excess mucus or purulent secretions, ear ache,   fever, chills, sweats, unintended wt loss or wt gain, classically pleuritic or exertional cp,  orthopnea pnd or arm/hand swelling  or leg swelling, presyncope, palpitations, abdominal pain, anorexia, nausea, vomiting, diarrhea  or change in bowel habits or change in bladder habits, change in stools or change in urine, dysuria, hematuria,  rash, arthralgias, visual complaints, headache, numbness, weakness or ataxia or problems with walking or coordination,  change in mood or  memory.        Current Meds  Medication Sig  . albuterol (PROVENTIL) (2.5 MG/3ML) 0.083% nebulizer solution USE ONE VIAL IN NEBULIZER EVERY 6 HOURS AS NEEDED FOR WHEEZING OR SHORTNESS OF BREATH  . aspirin 81 MG tablet Take 81 mg by mouth daily.  Marland Kitchen atorvastatin (LIPITOR) 40 MG tablet Take 1 tablet (40 mg total) by mouth daily.  Marland Kitchen b complex vitamins tablet Take 1 tablet by  mouth daily.  . budesonide-formoterol (SYMBICORT) 160-4.5 MCG/ACT inhaler Take 2 puffs first thing in am and then another 2 puffs about 12 hours later.  . Cyclobenzaprine HCl (FLEXERIL PO) Take 1 tablet by mouth 3 (three) times daily as needed.  . gabapentin (NEURONTIN) 300 MG capsule TAKE 1 CAPSULE BY MOUTH AT BEDTIME FOR  1  WEEK  THEN  INCREASE  TO  1  CAPSULE  TWICE  DAILY  . losartan (COZAAR) 25 MG tablet Take 25 mg by mouth daily.  . Multiple Vitamin (MULTIVITAMIN) capsule Take 1 capsule by mouth daily.  Marland Kitchen oxyCODONE-acetaminophen (PERCOCET/ROXICET) 5-325 MG per tablet Take 1 tablet by mouth every 6 (six) hours as  needed.  . predniSONE (DELTASONE) 10 MG tablet Take as directed  . PROAIR HFA 108 (90 Base) MCG/ACT inhaler INHALE 2 PUFFS BY MOUTH EVERY 6 HOURS AS NEEDED FOR WHEEZING AND FOR SHORTNESS OF BREATH  . Sildenafil Citrate (VIAGRA PO) Take 1 tablet by mouth as needed.  . sodium chloride (OCEAN) 0.65 % nasal spray Place 1 spray into the nose as needed for congestion.               Objective:   Physical Exam  Obese amb wm nad   01/18/2014       179 > 02/07/2015 190 > 03/21/2015   192 > 06/23/2015  196 >   12/30/2015 198 > 03/31/2016 198 >  07/02/2016  201 > 12/30/2016 201 >  04/01/2017   195 >  07/04/2017  201 > 10/03/2017  197 >   01/03/2018  206 > 04/05/2018    206> 05/23/2018  211    02/09/13 177 lb (80.287 kg)  12/27/12 173 lb 12.8 oz (78.835 kg)       Vital signs reviewed - Note on arrival 02 sats  95% on RA        HEENT: Poor  Dentition  5cmod   HEENT: Poor  dentition / nl  oropharynx. Nl external ear canals without cough reflex -  Mild bilateral non-specific turbinate edema     NECK :  without JVD/Nodes/TM/ nl carotid upstrokes bilaterally   LUNGS: no acc muscle use,  Mod barrel  contour chest wall with bilateral  Distant bs s audible wheeze and  without cough on insp or exp maneuver and mod  Hyperresonant  to  percussion bilaterally     CV:  RRR  no s3 or murmur or increase in P2, and no edema   ABD:  Obese/ soft and nontender with pos mid insp Hoover's  in the supine position. No bruits or organomegaly appreciated, bowel sounds nl  MS:  slt shuffling/waddling gait /ext warm without deformities, calf tenderness, cyanosis or clubbing No obvious joint restrictions   SKIN: warm and dry without lesions    NEURO:  alert, approp, nl sensorium with  no motor or cerebellar deficits apparent.        Labs ordered 05/23/2018   Alpha one AT screening       Assessment & Plan:

## 2018-05-23 NOTE — Patient Instructions (Addendum)
Plan A = Automatic = Symbicort 160 Take 2 puffs first thing in am and then another 2 puffs about 12 hours later.    if not doing well option is to go back to the bevespi or add spiriva     Plan B = Backup Only use your albuterol inhaler as a rescue medication to be used if you can't catch your breath by resting or doing a relaxed purse lip breathing pattern.  - The less you use it, the better it will work when you need it. - Ok to use the inhaler up to 2 puffs  every 4 hours if you must but call for appointment if use goes up over your usual need - Don't leave home without it !!  (think of it like the spare tire for your car)   Plan C = Crisis - only use your albuterol nebulizer if you first try Plan B and it fails to help > ok to use the nebulizer up to every 4 hours but if start needing it regularly call for immediate appointment   Please remember to go to the lab department   for your tests - we will call you with the results when they are available.  Please schedule a follow up office visit in 6 weeks, call sooner if needed with pfts

## 2018-05-24 ENCOUNTER — Encounter: Payer: Self-pay | Admitting: Internal Medicine

## 2018-05-24 NOTE — Assessment & Plan Note (Addendum)
-   02/09/2013  FEV1 2.14 (68%) ratio 60 p 30% better from B2 and dloc 52 corrects to 69 - 02/07/2015  extensive coaching HFA effectiveness =    90% from a baseline of 75%  - 02/07/2015  Walked RA x 3 laps @ 185 ft each stopped due to  End of study, nl pace, no sob or desat   - PFT's  03/21/2015  FEV1 1.92 (62 % ) ratio 60  p 14 % improvement from saba with DLCO  47 % corrects to 62 % for alv volume   - trial of spiriva respimat 03/21/2015  With goal to be able to walk up inclines > no better 06/23/2015 > try bevespi 2 bid x 3 weeks  -sob   Worse p 3 weeks bevespi  so resumed symb/spirva 06/25/2015  - referred to rehab 09/22/2015 >>> d/c'd p 19 sessions on 01/22/16 due to costs - 03/31/2016 rec pred x 6 days if needed  - 12/30/2016    restart spiriva 2.5 x 2 each am > could not afford it  - 04/01/2017    try bevespi  - 07/04/2017    continue beveslop and pred as Plan D but misunderstood and took daily  - 01/03/2018 daliresp trial in effort to reduced pred dependency > could not afford   - quit smoking again 02/2018  - 05/23/2018  After extensive coaching inhaler device,  effectiveness =    90% from a baseline of 75% > try symb 160 again as seems much better while on prednisone and if worse then add back spiriva  - Alpha one AT screen sent 05/23/2018

## 2018-05-24 NOTE — Assessment & Plan Note (Signed)
05/23/2018   Walked RA x one lap = 210 ft - stopped due to  desats to 88% then walked second lap on 3lpm POC and sats 89% at end , slow pace   No change rec:  No need for 02 x for extended walking (more than just around the house, but esp while exercising eg treadmill walking)     I had an extended discussion with the patient   reviewing all relevant studies completed to date and  lasting 15 to 20 minutes of a 25 minute visit  which included directly observing ambulatory 02 saturation study documented in a/p section of  today's  office note.  See device teaching which extended face to face time for this visit    Each maintenance medication was reviewed in detail including most importantly the difference between maintenance and prns and under what circumstances the prns are to be triggered using an action plan format that is not reflected in the computer generated alphabetically organized AVS.     Please see AVS for specific instructions unique to this visit that I personally wrote and verbalized to the the pt in detail and then reviewed with pt  by my nurse highlighting any changes in therapy recommended at today's visit .

## 2018-05-26 ENCOUNTER — Ambulatory Visit
Admission: RE | Admit: 2018-05-26 | Discharge: 2018-05-26 | Disposition: A | Discharge status: Home / Self Care | Payer: Medicare Other | Source: Ambulatory Visit | Attending: Family Medicine | Admitting: Family Medicine

## 2018-05-26 DIAGNOSIS — Z136 Encounter for screening for cardiovascular disorders: Secondary | ICD-10-CM

## 2018-05-27 LAB — ALPHA-1 ANTITRYPSIN PHENOTYPE: A-1 Antitrypsin, Ser: 125 mg/dL (ref 83–199)

## 2018-05-29 NOTE — Progress Notes (Signed)
Left detailed msg on machine ok per DPR

## 2018-06-28 ENCOUNTER — Encounter: Payer: Self-pay | Admitting: Internal Medicine

## 2018-07-05 ENCOUNTER — Ambulatory Visit: Payer: Medicare Other | Admitting: Internal Medicine

## 2018-07-05 ENCOUNTER — Ambulatory Visit (INDEPENDENT_AMBULATORY_CARE_PROVIDER_SITE_OTHER): Payer: Medicare Other | Admitting: Internal Medicine

## 2018-07-05 ENCOUNTER — Encounter: Payer: Self-pay | Admitting: Internal Medicine

## 2018-07-05 DIAGNOSIS — J9611 Chronic respiratory failure with hypoxia: Secondary | ICD-10-CM | POA: Diagnosis not present

## 2018-07-05 DIAGNOSIS — J449 Chronic obstructive pulmonary disease, unspecified: Secondary | ICD-10-CM | POA: Diagnosis not present

## 2018-07-05 LAB — PULMONARY FUNCTION TEST: DL/VA % PRED: 67 %

## 2018-07-05 MED ORDER — TIOTROPIUM BROMIDE MONOHYDRATE 2.5 MCG/ACT IN AERS: 2.0000 | INHALATION_SPRAY | Freq: Every day | RESPIRATORY_TRACT | 0 refills | Status: DC

## 2018-07-05 MED ORDER — TIOTROPIUM BROMIDE MONOHYDRATE 2.5 MCG/ACT IN AERS: 2.0000 | INHALATION_SPRAY | Freq: Every day | RESPIRATORY_TRACT | 11 refills | Status: DC

## 2018-07-05 NOTE — Progress Notes (Signed)
Subjective:   Patient ID: Joel Perez, male    DOB: 1945-06-27   MRN: [id number]    Brief patient profile:  72 yowm MM/ quit smoking 02/2018  referred by Dr Moreen Fowler for eval of copd with GOLD II criteria established 02/09/13    History of Present Illness  12/27/2012 1st pulmonary eval/ Wert cc onset x 2009 am cough cough/ congestion and progresive doe with white mucus x sev tbsp x sev min better p proaire and maintained on spiriva which he feels doesn't work as well as advair. Doe x one flight steps, dragging the garbage to street stops x 2. He denies cough to me but actually has a rattling with cough maneuver but doesn't bring up much mucus, what he does bring up is mostly in ams and mucoid and some better since quit smoking  rec symbicort 160 Take 2 puffs first thing in am and then another 2 puffs about 12 hours later.  Only use your albuterol (proaire) as a rescue medication to be used if you can't catch your breath by resting or doing a relaxed purse lip breathing pattern. The less you use it, the better it will work when you need it.  As you improve ok to stop spiriva  Please schedule a follow up office visit in 6 weeks, call sooner if needed with pft's    09/22/2015  f/u ov/Wert re: GOLD III copd/ maint rx = symbicort/ spiriva resp We will be referring you to rehab at [facility]      03/31/2016  f/u ov/Wert re:  GOLD III/ still not smoking cigs/ some ecigs / today took symbicort 2 and spivirva (one puff to make it last)  Chief Complaint  Patient presents with  . Follow-up    Breathing is doing well. He states had to stop attending rehab due to insurance issues. He states he uses rescue inhaler nightly due to wheezing.    at end of rehab able to do 2 mph flat x 15 min but has not continued at home though he does have similar equipment  Using proair 1 hour after symbicort in pm " to break up my night congestion/wheezing" Not taking either symb or spiriva as rec at previous ov ? Due to  cost ? Due to not processing instructions? rec Plan A = Automatic = Symbicort 160 2 pffs first thing in AM  followed by [facility] and another 2 pffs 12 hours later of symbicort only Plan B = Backup Only use your albuterol as a rescue medication to be used if you can't catch your breath by resting or doing a relaxed purse lip breathing pattern.  - The less you use it, the better it will work when you need it. - Ok to use the inhaler up to 2 puffs  every 4 hours if you must but call for appointment if use goes up over your usual need - Don't leave home without it !!  (think of it like the spare tire for your car)  Plan C = Crisis - only use your albuterol nebulizer if you first try Plan B and it fails to help > ok to use the nebulizer up to every 4 hours but if start needing it regularly call for immediate appointment If breathing / wheezing coughing getting worse > Prednisone 10 mg take  4 each am x 2 days,   2 each am x 2 days,  1 each am x 2 days and stop  Try to walk  daily if possible to let me know if you are losing any ground      10/03/2017  f/u ov/Wert re:  GOLD III still smoking  / having trouble affording meds Chief Complaint  Patient presents with  . Follow-up    Increased SOB over the past 2 wks. He is using his proair 4-5 x per day and neb once daily on average.  He states he gets SOB walking just short distances. He did try round of pred and this helped some. He has been coughing up some clear sputum.    Dyspnea:  Still doe = MMRC3 = can't walk 100 yards even at a slow pace at a flat grade s stopping due to sob   Cough: clear / thick/ rattling worst in am  Sleep: R side / 2 pillows /no 02  SABA use:  Overusing unless on prednisone rec Plan A = Automatic =  BEVESP  Take 2 puffs first thing in am and then another 2 puffs about 12 hours later.  Work on maintaining perfect  inhaler technique:   Plan B = Backup Only use your albuterol as a rescue medication Plan C = Crisis - only  use your albuterol nebulizer if you first try Plan B and it fails to help > ok to use the nebulizer up to every 4 hours but if start needing it regularly call for immediate appointment Plan D = Deltasone, take x 2 daily until better then 1 daily x 5 days until better and stop  The key is to stop smoking completely before smoking completely stops you!  Please schedule a follow up visit in 3 months but call sooner if needed  with all medications /inhalers/ solutions in hand so we can verify exactly what you are taking. This includes all medications from all doctors and over the counters     01/03/2018  f/u ov/Wert re:  GOLD III/ still smoking cigars mostly / still trouble affording meds / did not bring them as req Chief Complaint  Patient presents with  . Follow-up    Breathing is about the same. He is using his proair 2 x daily on average and neb at least once per day.   Dyspnea:  Leans on cart at [place] = MMRC3 = can't walk 100 yards even at a slow pace at a flat grade s stopping due to sob   Cough: better since pred rx but misunderstood instructions and takng daily rather than as "plan D"  Sleeping: R side down/ 2 pillows  SABA use: bid avg which is less than prior to prednisone 02: none   rec No change in medications except ok to take   prednisone 10 mg 1 daily until better then stay one half daily until return Try daliresp 250 mg daily with bfast x one month and if helps "congestion" then try the prescription for 500 mg one daily > did not help, could not afford it The key is to stop all  smoking completely before smoking completely stops you!          04/05/2018  f/u ov/Wert re: copd III/ stopped smoking Oct 2019 / could not afford daliresp/ did not continue pred Chief Complaint  Patient presents with  . Follow-up    getting over a "chest cold" that he had a month ago. He is coughing up some light yellow sputum.  He has had a slight increase in his SOB.  Dyspnea:  Walking at  [employer] once a week,  not checking sats/ using 02 after finish Cough: was worse x 03/13/18 seems to be getting better on its own Sleeping: bed is flat, several pillows  SABA use: 5-6 x day and neb sev x per week  02: bought his own POC, doesn't think it works- just uses p ex, not during, does not monitor sats with activity on or off POC/ no noct 02 rec Prednisone 10 mg   2 daily until better then 1 daily x 5 days and then one half daily x 5 days and stop - start over if breathing worsens  Continue bevespi  Take 2 puffs first thing in am and then another 2 puffs about 12 hours later.  Only use your albuterol as a rescue medication Adjust 02 to keep sat > 90% while walking  Please schedule a follow up office visit in 6 weeks, call sooner if needed with pfts on return - please bring your concentrator with you so we can check it - alpha one screening not done     05/23/2018  f/u ov/Wert re:  Copd  GOLD  III maint bevespi and prn pred Chief Complaint  Patient presents with  . Follow-up    Breathing is about the same. He is using his proair inhaler 2 x daily on average and neb about 3 x per wk.   Dyspnea:  Walking at [employer]/ back on treadmill x 20 min x slow speed x flat 3 days a week  Cough: not problem but has sense of throat congestion  Sleeping: bed is flat  SABA use: twice daily when over does it  02: only with activity uses 3lpm per inogen he purchased/ says not adjustable  Does much better while on prednisone than off  rec Plan A = Automatic = Symbicort 160 Take 2 puffs first thing in am and then another 2 puffs about 12 hours later.   if not doing well option is to go back to the bevespi or add spiriva  Plan B = Backup Only use your albuterol inhaler as a rescue medication Plan C = Crisis - only use your albuterol nebulizer if you first try Plan B and it fails to help > ok to use the nebulizer up to every 4 hours but if start needing it regularly call for immediate  appointment    07/05/2018  f/u ov/Wert re:  GOLD III / maint on symb 160 2bid  Chief Complaint  Patient presents with  . Follow-up    PFT done today. Breathing has improved some. He uses his albuterol inhaler about once per day. He has not needed neb.   Dyspnea:  Treadmill at 1.5 mph at zero incline x 10 min stopped by back and hp / some [employer] ok MMRC3 = can't walk 100 yards even at a slow pace at a flat grade s stopping due to sob   Cough: none Sleeping: bed is flat/ 2 big pillows  SABA use: once a day  If has to go out of house for any reason   02: has but not using    No obvious day to day or daytime variability or assoc excess/ purulent sputum or mucus plugs or hemoptysis or cp or chest tightness, subjective wheeze or overt sinus or hb symptoms.   Sleeps as above without nocturnal  or early am exacerbation  of respiratory  c/o's or need for noct saba. Also denies any obvious fluctuation of symptoms with weather or environmental changes or other aggravating or alleviating factors  except as outlined above   No unusual exposure hx or h/o childhood pna/ asthma or knowledge of premature birth.  Current Allergies, Complete Past Medical History, Past Surgical History, Family History, and Social History were reviewed in [employer] record.  ROS  The following are not active complaints unless bolded Hoarseness, sore throat, dysphagia, dental problems, itching, sneezing,  nasal congestion or discharge of excess mucus or purulent secretions, ear ache,   fever, chills, sweats, unintended wt loss or wt gain, classically pleuritic or exertional cp,  orthopnea pnd or arm/hand swelling  or leg swelling, presyncope, palpitations, abdominal pain, anorexia, nausea, vomiting, diarrhea  or change in bowel habits or change in bladder habits, change in stools or change in urine, dysuria, hematuria,  rash, arthralgias, visual complaints, headache, numbness, weakness or ataxia or problems  with walking or coordination,  change in mood or  memory.        Current Meds  Medication Sig  . albuterol (PROVENTIL) (2.5 MG/3ML) 0.083% nebulizer solution USE ONE VIAL IN NEBULIZER EVERY 6 HOURS AS NEEDED FOR WHEEZING OR SHORTNESS OF BREATH  . aspirin 81 MG tablet Take 81 mg by mouth daily.  Marland Kitchen atorvastatin (LIPITOR) 40 MG tablet Take 1 tablet (40 mg total) by mouth daily.  Marland Kitchen b complex vitamins tablet Take 1 tablet by mouth daily.  . budesonide-formoterol (SYMBICORT) 160-4.5 MCG/ACT inhaler Take 2 puffs first thing in am and then another 2 puffs about 12 hours later.  . Cyclobenzaprine HCl (FLEXERIL PO) Take 1 tablet by mouth 3 (three) times daily as needed.  . gabapentin (NEURONTIN) 300 MG capsule TAKE 1 CAPSULE BY MOUTH AT BEDTIME FOR  1  WEEK  THEN  INCREASE  TO  1  CAPSULE  TWICE  DAILY  . losartan (COZAAR) 25 MG tablet Take 25 mg by mouth daily.  . Multiple Vitamin (MULTIVITAMIN) capsule Take 1 capsule by mouth daily.  Marland Kitchen oxyCODONE-acetaminophen (PERCOCET/ROXICET) 5-325 MG per tablet Take 1 tablet by mouth every 6 (six) hours as needed.  Marland Kitchen PROAIR HFA 108 (90 Base) MCG/ACT inhaler INHALE 2 PUFFS BY MOUTH EVERY 6 HOURS AS NEEDED FOR WHEEZING AND FOR SHORTNESS OF BREATH  . Sildenafil Citrate (VIAGRA PO) Take 1 tablet by mouth as needed.  . sodium chloride (OCEAN) 0.65 % nasal spray Place 1 spray into the nose as needed for congestion.                Objective:   Physical Exam  Obese wm nad   01/18/2014       179 > 02/07/2015 190 > 03/21/2015   192 > 06/23/2015  196 >   12/30/2015 198 > 03/31/2016 198 >  07/02/2016  201 > 12/30/2016 201 >  04/01/2017   195 >  07/04/2017  201 > 10/03/2017  197 >   01/03/2018  206 > 04/05/2018    206> 05/23/2018  211 > 07/05/2018  217     02/09/13 177 lb (80.287 kg)  12/27/12 173 lb 12.8 oz (78.835 kg)      Vital signs reviewed - Note on arrival 02 sats  92% on RA       HEENT: Poor dentition with nl  oropharynx. Nl external ear canals without cough reflex -   Mild  bilateral non-specific turbinate edema     NECK :  without JVD/Nodes/TM/ nl carotid upstrokes bilaterally   LUNGS: no acc muscle use,  Mod barrel  contour chest wall with bilateral  Distant bs s audible wheeze  and  without cough on insp or exp maneuver and mod  Hyperresonant  to  percussion bilaterally     CV:  RRR  no s3 or murmur or increase in P2, and  Trace to 1+ ankle edema sym bilaterally  ABD:  soft and nontender with pos mid insp Hoover's  in the supine position. No bruits or organomegaly appreciated, bowel sounds nl  MS:   Nl gait/  ext warm without deformities, calf tenderness, cyanosis or clubbing No obvious joint restrictions   SKIN: warm and dry without lesions    NEURO:  alert, approp, nl sensorium with  no motor or cerebellar deficits apparent.                 Assessment & Plan:

## 2018-07-05 NOTE — Patient Instructions (Signed)
Plan A = Automatic = symbicort 160 Take 2 puffs first thing in am and then another 2 puffs about 12 hours later.  And add spiriva 2 pffs each am   Work on inhaler technique:  relax and gently blow all the way out then take a nice smooth deep breath back in, triggering the inhaler at same time you start breathing in.  Hold for up to 5 seconds if you can. Blow out thru nose. Rinse and gargle with water when done      Plan B = Backup Only use your albuterol inhaler as a rescue medication to be used if you can't catch your breath by resting or doing a relaxed purse lip breathing pattern.  - The less you use it, the better it will work when you need it. - Ok to use the inhaler up to 2 puffs  every 4 hours if you must but call for appointment if use goes up over your usual need - Don't leave home without it !!  (think of it like the spare tire for your car)   Plan C = Crisis - only use your albuterol nebulizer if you first try Plan B and it fails to help > ok to use the nebulizer up to every 4 hours but if start needing it regularly call for immediate appointment   Place your 0xygen on  when you walk to maintain the saturation above 90% or walk slower    Please schedule a follow up visit in 3 months but call sooner if needed

## 2018-07-05 NOTE — Progress Notes (Signed)
PFT completed today;N2 washout instead of Pleth.Blair Hailey

## 2018-07-06 ENCOUNTER — Encounter: Payer: Self-pay | Admitting: Internal Medicine

## 2018-07-06 NOTE — Assessment & Plan Note (Signed)
05/23/2018   Walked RA x one lap = 210 ft - stopped due to  desats to 88% then walked second lap on 3lpm POC and sats 89% at end , slow pace   Again advised to use 02 with activity if that activity drops sats below 90% or do the activity at a slower pace if he doesn't want to carry the 02 but still wants to be active as it should help ex tol/ help burn fat to improve calorie balance.

## 2018-07-06 NOTE — Assessment & Plan Note (Addendum)
Quit smoking 02/2018  - MM phenotype  - 02/09/2013  FEV1 2.14 (68%) ratio 60 p 30% better from B2 and dloc 52 corrects to 69 - 02/07/2015  extensive coaching HFA effectiveness =    90% from a baseline of 75%  - 02/07/2015  Walked RA x 3 laps @ 185 ft each stopped due to  End of study, nl pace, no sob or desat   - PFT's  03/21/2015  FEV1 1.92 (62 % ) ratio 60  p 14 % improvement from saba with DLCO  47 % corrects to 62 % for alv volume   - trial of spiriva respimat 03/21/2015  With goal to be able to walk up inclines > no better 06/23/2015 > try bevespi 2 bid x 3 weeks  -sob   Worse p 3 weeks bevespi  so resumed symb/spirva 06/25/2015  - referred to rehab 09/22/2015 >>> d/c'd p 19 sessions on 01/22/16 due to costs - 03/31/2016 rec pred x 6 days if needed  - 12/30/2016    restart spiriva 2.5 x 2 each am > could not afford it  - 04/01/2017    try bevespi  - 07/04/2017    continue beveslop and pred as Plan D but misunderstood and took daily  - 01/03/2018 daliresp trial in effort to reduced pred dependency > could not afford  - quit smoking again 02/2018  - 05/23/2018  After extensive coaching inhaler device,  effectiveness =    90% from a baseline of 75% > try symb 160 again as seems much better while on prednisone and if worse then add back spiriva  - Alpha one AT screen 05/23/2018   MM level 125   - PFT's  07/05/2018  FEV1 1.29 (43 % ) ratio 0.49  p 36 % improvement from saba p symb 160 x 2  prior to study with DLCO  48 % corrects to 67  % for alv volume   - 07/05/2018  After extensive coaching inhaler device,  effectiveness =  90% with smi so try spiriva 2.5 x 2 puffs each am     Group D in terms of symptom/risk and laba/lama/ICS  therefore appropriate rx at this point so reasonable to add lama at this point if insurance will cover Advised:  formulary restrictions will be an ongoing challenge for the forseable future and I would be happy to pick an alternative if the pt will first  provide me a list of them -   pt  will need to return here for training for any new device that is required eg dpi vs hfa vs respimat.    In the meantime we can always provide samples so that the patient never runs out of any needed respiratory medications.    I had an extended discussion with the patient reviewing all relevant studies completed to date and  lasting 15 to 20 minutes of a 25 minute visit    See device teaching which extended face to face time for this visit.  Each maintenance medication was reviewed in detail including emphasizing most importantly the difference between maintenance and prns and under what circumstances the prns are to be triggered using an action plan format that is not reflected in the computer generated alphabetically organized AVS which I have not found useful in most complex patients, especially with respiratory illnesses  Please see AVS for specific instructions unique to this visit that I personally wrote and verbalized to the the pt in detail and then reviewed  with pt  by my nurse highlighting any  changes in therapy recommended at today's visit to their plan of care.

## 2018-08-02 ENCOUNTER — Other Ambulatory Visit: Payer: Self-pay | Admitting: Cardiovascular Disease

## 2018-08-02 DIAGNOSIS — I739 Peripheral vascular disease, unspecified: Secondary | ICD-10-CM

## 2018-08-21 ENCOUNTER — Other Ambulatory Visit: Payer: Self-pay | Admitting: Internal Medicine

## 2018-08-22 ENCOUNTER — Telehealth: Payer: Self-pay | Admitting: Internal Medicine

## 2018-08-22 MED ORDER — PREDNISONE 10 MG PO TABS: 20.0000 mg | ORAL_TABLET | ORAL | 0 refills | Status: DC

## 2018-08-22 NOTE — Telephone Encounter (Signed)
Called [employer] and gave directions 2 per day as directed. Called patient made aware 2 per day until breathing improves then 1 per day x 5 days then STOP. If he needs to resume then call office back for direction. Pt verbalized understanding. Nothing further needed.

## 2018-08-22 NOTE — Telephone Encounter (Signed)
Take 2 daily until breathing better then 1 daily x 5 days and stop

## 2018-08-22 NOTE — Telephone Encounter (Signed)
[facility] calling needing directions on Prednisone 10 mgs that was sent yesterday. Please advise.

## 2018-10-03 ENCOUNTER — Ambulatory Visit: Payer: Medicare Other | Admitting: Internal Medicine

## 2018-10-13 ENCOUNTER — Ambulatory Visit: Payer: Medicare Other | Admitting: Internal Medicine

## 2018-10-30 ENCOUNTER — Telehealth: Payer: Self-pay | Admitting: *Deleted

## 2018-10-30 NOTE — Telephone Encounter (Signed)
Message left,re: follow up visit. 

## 2018-11-05 LAB — PULMONARY FUNCTION TEST
DL/VA % pred: 69 %
DL/VA: 3.15 ml/min/mmHg/L
DLCO unc % pred: 52 %
DLCO unc: 16.35 ml/min/mmHg
FEF 25-75 Post: 1.35 L/sec
FEF 25-75 Pre: 0.58 L/sec
FEF2575-%Change-Post: 131 %
FEF2575-%Pred-Post: 56 %
FEF2575-%Pred-Pre: 24 %
FEV1-%Change-Post: 30 %
FEV1-%Pred-Post: 68 %
FEV1-%Pred-Pre: 52 %
FEV1-Post: 2.14 L
FEV1-Pre: 1.65 L
FEV1FVC-%Change-Post: 12 %
FEV1FVC-%Pred-Pre: 71 %
FEV6-%Change-Post: 16 %
FEV6-%Pred-Post: 83 %
FEV6-%Pred-Pre: 72 %
FEV6-Post: 3.39 L
FEV6-Pre: 2.91 L
FEV6FVC-%Change-Post: 1 %
FEV6FVC-%Pred-Post: 101 %
FEV6FVC-%Pred-Pre: 99 %
FVC-%Change-Post: 15 %
FVC-%Pred-Post: 83 %
FVC-%Pred-Pre: 72 %
FVC-Post: 3.58 L
Post FEV1/FVC ratio: 60 %
Post FEV6/FVC ratio: 96 %
Pre FEV1/FVC ratio: 53 %
Pre FEV6/FVC Ratio: 94 %
RV % pred: 119 %
RV: 2.85 L
TLC % pred: 90 %
TLC: 6.16 L

## 2018-11-14 ENCOUNTER — Other Ambulatory Visit: Payer: Self-pay

## 2018-11-14 ENCOUNTER — Encounter: Payer: Self-pay | Admitting: Internal Medicine

## 2018-11-14 ENCOUNTER — Telehealth: Payer: Self-pay | Admitting: Internal Medicine

## 2018-11-14 ENCOUNTER — Ambulatory Visit (INDEPENDENT_AMBULATORY_CARE_PROVIDER_SITE_OTHER): Payer: Medicare Other | Admitting: Internal Medicine

## 2018-11-14 DIAGNOSIS — J9611 Chronic respiratory failure with hypoxia: Secondary | ICD-10-CM

## 2018-11-14 DIAGNOSIS — R0609 Other forms of dyspnea: Secondary | ICD-10-CM | POA: Diagnosis not present

## 2018-11-14 DIAGNOSIS — J449 Chronic obstructive pulmonary disease, unspecified: Secondary | ICD-10-CM | POA: Diagnosis not present

## 2018-11-14 DIAGNOSIS — R0600 Dyspnea, unspecified: Secondary | ICD-10-CM

## 2018-11-14 LAB — CBC WITH DIFFERENTIAL/PLATELET
Basophils Absolute: 0.1 10*3/uL (ref 0.0–0.1)
Basophils Relative: 0.6 % (ref 0.0–3.0)
Eosinophils Absolute: 0.6 10*3/uL (ref 0.0–0.7)
Eosinophils Relative: 5.6 % — ABNORMAL HIGH (ref 0.0–5.0)
HCT: 47.9 % (ref 39.0–52.0)
Hemoglobin: 15.7 g/dL (ref 13.0–17.0)
Lymphocytes Relative: 25.8 % (ref 12.0–46.0)
Lymphs Abs: 2.9 10*3/uL (ref 0.7–4.0)
MCHC: 32.7 g/dL (ref 30.0–36.0)
MCV: 99.1 fl (ref 78.0–100.0)
Monocytes Absolute: 1 10*3/uL (ref 0.1–1.0)
Monocytes Relative: 9.4 % (ref 3.0–12.0)
Neutro Abs: 6.5 10*3/uL (ref 1.4–7.7)
Neutrophils Relative %: 58.6 % (ref 43.0–77.0)
Platelets: 233 10*3/uL (ref 150.0–400.0)
RBC: 4.84 Mil/uL (ref 4.22–5.81)
RDW: 14 % (ref 11.5–15.5)
WBC: 11 10*3/uL — ABNORMAL HIGH (ref 4.0–10.5)

## 2018-11-14 LAB — TSH: TSH: 2 u[IU]/mL (ref 0.35–4.50)

## 2018-11-14 LAB — HEPATIC FUNCTION PANEL
ALT: 39 U/L (ref 0–53)
AST: 30 U/L (ref 0–37)
Albumin: 4.4 g/dL (ref 3.5–5.2)
Alkaline Phosphatase: 46 U/L (ref 39–117)
Bilirubin, Direct: 0.1 mg/dL (ref 0.0–0.3)
Total Bilirubin: 0.7 mg/dL (ref 0.2–1.2)
Total Protein: 6.9 g/dL (ref 6.0–8.3)

## 2018-11-14 LAB — BASIC METABOLIC PANEL
BUN: 15 mg/dL (ref 6–23)
CO2: 30 mEq/L (ref 19–32)
Calcium: 9.2 mg/dL (ref 8.4–10.5)
Chloride: 102 mEq/L (ref 96–112)
Creatinine, Ser: 0.89 mg/dL (ref 0.40–1.50)
GFR: 83.28 mL/min (ref >60.00)
Glucose, Bld: 76 mg/dL (ref 70–99)
Potassium: 4.1 mEq/L (ref 3.5–5.1)
Sodium: 140 mEq/L (ref 135–145)

## 2018-11-14 LAB — D-DIMER, QUANTITATIVE: D-Dimer, Quant: 0.75 mcg/mL FEU — ABNORMAL HIGH (ref <0.50)

## 2018-11-14 LAB — BRAIN NATRIURETIC PEPTIDE: Pro B Natriuretic peptide (BNP): 32 pg/mL (ref 0.0–100.0)

## 2018-11-14 NOTE — Telephone Encounter (Signed)
ATC pt, no answer. Left message for pt to call back.  I wanted to ask him where he received his oxygen from because its not in the coordination note.

## 2018-11-14 NOTE — Progress Notes (Signed)
Subjective:   Patient ID: Joel Perez, male    DOB: 07-17-1945   MRN: [id number]    Brief patient profile:  73 yowm MM/ quit smoking 02/2018  referred by Dr Moreen Fowler for eval of copd with GOLD II criteria established 02/09/13    History of Present Illness  12/27/2012 1st pulmonary eval/ Wert cc onset x 2009 am cough cough/ congestion and progresive doe with white mucus x sev tbsp x sev min better p proaire and maintained on spiriva which he feels doesn't work as well as advair. Doe x one flight steps, dragging the garbage to street stops x 2. He denies cough to me but actually has a rattling with cough maneuver but doesn't bring up much mucus, what he does bring up is mostly in ams and mucoid and some better since quit smoking  rec symbicort 160 Take 2 puffs first thing in am and then another 2 puffs about 12 hours later.  Only use your albuterol (proaire) as a rescue medication to be used if you can't catch your breath by resting or doing a relaxed purse lip breathing pattern. The less you use it, the better it will work when you need it.  As you improve ok to stop spiriva  Please schedule a follow up office visit in 6 weeks, call sooner if needed with pft's    09/22/2015  f/u ov/Wert re: GOLD III copd/ maint rx = symbicort/ spiriva resp We will be referring you to rehab at [facility]           10/03/2017  f/u ov/Wert re:  GOLD III still smoking  / having trouble affording meds Chief Complaint  Patient presents with  . Follow-up    Increased SOB over the past 2 wks. He is using his proair 4-5 x per day and neb once daily on average.  He states he gets SOB walking just short distances. He did try round of pred and this helped some. He has been coughing up some clear sputum.    Dyspnea:  Still doe = MMRC3 = can't walk 100 yards even at a slow pace at a flat grade s stopping due to sob   Cough: clear / thick/ rattling worst in am  Sleep: R side / 2 pillows /no 02  SABA use:  Overusing  unless on prednisone rec Plan A = Automatic =  BEVESP  Take 2 puffs first thing in am and then another 2 puffs about 12 hours later.  Work on maintaining perfect  inhaler technique:   Plan B = Backup Only use your albuterol as a rescue medication Plan C = Crisis - only use your albuterol nebulizer if you first try Plan B and it fails to help > ok to use the nebulizer up to every 4 hours but if start needing it regularly call for immediate appointment Plan D = Deltasone, take x 2 daily until better then 1 daily x 5 days until better and stop  The key is to stop smoking completely before smoking completely stops you!  Please schedule a follow up visit in 3 months but call sooner if needed  with all medications /inhalers/ solutions in hand so we can verify exactly what you are taking. This includes all medications from all doctors and over the counters                      07/05/2018  f/u ov/Wert re:  GOLD III / maint on  symb 160 2bid  Chief Complaint  Patient presents with  . Follow-up    PFT done today. Breathing has improved some. He uses his albuterol inhaler about once per day. He has not needed neb.   Dyspnea:  Treadmill at 1.5 mph at zero incline x 10 min stopped by back   / some [employer] ok MMRC3 = can't walk 100 yards even at a slow pace at a flat grade s stopping due to sob   Cough: none Sleeping: bed is flat/ 2 big pillows  SABA use: once a day  If has to go out of house for any reason  02: has but not using  rec Plan A = Automatic = symbicort 160 Take 2 puffs first thing in am and then another 2 puffs about 12 hours later.  And add spiriva 2 pffs each am  Work on inhaler technique:  Plan B = Backup Only use your albuterol inhaler as a rescue medication Plan C = Crisis - only use your albuterol nebulizer if you first try Plan B   Place your 0xygen on  when you walk to maintain the saturation above 90% or walk slower    11/14/2018  f/u ov/Wert re:  Copd III no  better on 20 mg prednisone daily finished it and this time says no worse off and doesn't think spiriva helping either  Chief Complaint  Patient presents with  . Follow-up    Breathing is about the same. He is using his proair 2-3 x per day and neb 2 x per wk.  Dyspnea:  MMRC3 = can't walk 100 yards even at a slow pace at a flat grade s stopping due to sob   Cough: minimal  Sleeping: on bed / flat/ 2 pillows  SABA use: as above 02: has poc doesn't think it works    No obvious day to day or daytime variability or assoc excess/ purulent sputum or mucus plugs or hemoptysis or cp or chest tightness, subjective wheeze or overt sinus or hb symptoms.   Sleeping  without nocturnal  or early am exacerbation  of respiratory  c/o's or need for noct saba. Also denies any obvious fluctuation of symptoms with weather or environmental changes or other aggravating or alleviating factors except as outlined above   No unusual exposure hx or h/o childhood pna/ asthma or knowledge of premature birth.  Current Allergies, Complete Past Medical History, Past Surgical History, Family History, and Social History were reviewed in [employer] record.  ROS  The following are not active complaints unless bolded Hoarseness, sore throat, dysphagia, dental problems, itching, sneezing,  nasal congestion or discharge of excess mucus or purulent secretions, ear ache,   fever, chills, sweats, unintended wt loss or wt gain, classically pleuritic or exertional cp,  orthopnea pnd or arm/hand swelling  or leg swelling, presyncope, palpitations, abdominal pain, anorexia, nausea, vomiting, diarrhea  or change in bowel habits or change in bladder habits, change in stools or change in urine, dysuria, hematuria,  rash, arthralgias, visual complaints, headache, numbness, weakness or ataxia or problems with walking or coordination,  change in mood or  memory.        Current Meds  Medication Sig  . albuterol  (PROVENTIL) (2.5 MG/3ML) 0.083% nebulizer solution USE ONE VIAL IN NEBULIZER EVERY 6 HOURS AS NEEDED FOR WHEEZING OR SHORTNESS OF BREATH  . aspirin 81 MG tablet Take 81 mg by mouth daily.  Marland Kitchen atorvastatin (LIPITOR) 40 MG tablet Take 1 tablet  by mouth daily  . b complex vitamins tablet Take 1 tablet by mouth daily.  . budesonide-formoterol (SYMBICORT) 160-4.5 MCG/ACT inhaler Take 2 puffs first thing in am and then another 2 puffs about 12 hours later.  . Cyclobenzaprine HCl (FLEXERIL PO) Take 1 tablet by mouth 3 (three) times daily as needed.  . gabapentin (NEURONTIN) 300 MG capsule TAKE 1 CAPSULE BY MOUTH AT BEDTIME FOR  1  WEEK  THEN  INCREASE  TO  1  CAPSULE  TWICE  DAILY  . losartan (COZAAR) 25 MG tablet Take 25 mg by mouth daily.  . Multiple Vitamin (MULTIVITAMIN) capsule Take 1 capsule by mouth daily.  Marland Kitchen oxyCODONE-acetaminophen (PERCOCET/ROXICET) 5-325 MG per tablet Take 1 tablet by mouth every 6 (six) hours as needed.  Marland Kitchen PROAIR HFA 108 (90 Base) MCG/ACT inhaler INHALE 2 PUFFS BY MOUTH EVERY 6 HOURS AS NEEDED FOR WHEEZING AND FOR SHORTNESS OF BREATH  . Sildenafil Citrate (VIAGRA PO) Take 1 tablet by mouth as needed.  . sodium chloride (OCEAN) 0.65 % nasal spray Place 1 spray into the nose as needed for congestion.  . Tiotropium Bromide Monohydrate (SPIRIVA RESPIMAT) 2.5 MCG/ACT AERS Inhale 2 puffs into the lungs daily.                   Objective:   Physical Exam  Obese wm nad    01/18/2014       179 > 02/07/2015 190 > 03/21/2015   192 > 06/23/2015  196 >   12/30/2015 198 > 03/31/2016 198 >  07/02/2016  201 > 12/30/2016 201 >  04/01/2017   195 >  07/04/2017  201 > 10/03/2017  197 >   01/03/2018  206 > 04/05/2018    206> 05/23/2018  211 > 07/05/2018  217 > 11/14/2018  225     02/09/13 177 lb (80.287 kg)  12/27/12 173 lb 12.8 oz (78.835 kg)      Vital signs reviewed - Note on arrival 02 sats  97% on  RA       HEENT: Poor dentition   ,  Mod barrel  contour chest   Trace to 1+ ankle edema sym  bilaterally   HEENT: Poor dentition / oropharynx. Nl external ear canals without cough reflex -  Mild bilateral non-specific turbinate edema     NECK :  without JVD/Nodes/TM/ nl carotid upstrokes bilaterally   LUNGS: no acc muscle use,  Mod barrel  contour chest wall with bilateral  Distant bs s audible wheeze and  without cough on insp or exp maneuver and mod  Hyperresonant  to  percussion bilaterally     CV:  RRR  no s3 or murmur or increase in P2, and   L> R ankle edema   ABD:  soft and nontender with pos mid  insp Hoover's  in the supine position. No bruits or organomegaly appreciated, bowel sounds nl  MS:   Nl gait/  ext warm without deformities, calf tenderness, cyanosis or clubbing No obvious joint restrictions   SKIN: warm and dry without lesions    NEURO:  alert, approp, nl sensorium with  no motor or cerebellar deficits apparent.        Labs ordered/ reviewed:      Chemistry      Component Value Date/Time   NA 140 11/14/2018 1404   K 4.1 11/14/2018 1404   CL 102 11/14/2018 1404   CO2 30 11/14/2018 1404   BUN 15 11/14/2018 1404   CREATININE  0.89 11/14/2018 1404      Component Value Date/Time   CALCIUM 9.2 11/14/2018 1404   ALKPHOS 46 11/14/2018 1404   AST 30 11/14/2018 1404   ALT 39 11/14/2018 1404   BILITOT 0.7 11/14/2018 1404   BILITOT 0.4 04/08/2017 1504        Lab Results  Component Value Date   WBC 11.0 (H) 11/14/2018   HGB 15.7 11/14/2018   HCT 47.9 11/14/2018   MCV 99.1 11/14/2018   PLT 233.0 11/14/2018       EOS                                                               0.6                                    11/14/2018   Lab Results  Component Value Date   DDIMER 0.75 (H) 11/14/2018      Lab Results  Component Value Date   TSH 2.00 11/14/2018     Lab Results  Component Value Date   PROBNP 32.0 11/14/2018             Assessment & Plan:

## 2018-11-14 NOTE — Patient Instructions (Signed)
Try off the spiriva (think of it like high octane fuel)  Goal for walking is to keep your 02 sats above 90% when walking   Please remember to go to the lab department   for your tests - we will call you with the results when they are available.      Please schedule a follow up visit in 3 months but call sooner if needed

## 2018-11-14 NOTE — Progress Notes (Signed)
Spoke with pt and notified of results per Dr. Wert. Pt verbalized understanding and denied any questions. 

## 2018-11-15 NOTE — Telephone Encounter (Signed)
ATC Patient.  LMTCB. 

## 2018-11-16 NOTE — Telephone Encounter (Signed)
Called [employer], spoke with Santiago Glad.  Santiago Glad stated Patient does not have O2 with [employer], and there is no notes with O2.  Order placed 11/14/18- Route (nasal cannula OR mask): [place] Liter Flow: 2lpm  Frequency (continuous with stationary and portable oxygen unit needed OR only at night): exertional  Length of Need: Lifetime Was this based off an ONO?: No DME:Not est  Oxygen Conserving Device (Yes or No): Yes Type of Oxygen: Gas If new O2 start, please evaluate and titrate for best fit POC or portable O2 system.  Pt needs POC- needs o2 with exertion only 2lpm  Message routed to [facility]

## 2018-11-16 NOTE — Telephone Encounter (Signed)
MW nothing further is needed with this note. Thanks. Will close encounter.

## 2018-11-16 NOTE — Telephone Encounter (Signed)
From: Mariann Barter Sent: 11/16/2018   2:19 PM EDT To: Darlina Guys, Mariann Barter, * Subject: RE: O2                                         Thank you. I will be on the look out for a message that it is done. That is all I am waiting for,

## 2018-11-16 NOTE — Telephone Encounter (Signed)
Oxygen saturation information has been updated. Will route message to MW to addend note to reflect this info.   MW please addend note for [facility] to reflect this:  Patient Saturations on Room Air at Rest = 96  %  Patient Saturations on Room Air while Ambulating = 88%  Patient Saturations on 2  Liters of oxygen while Ambulating = 94%  MW please advise once note has been updated.

## 2018-11-16 NOTE — Telephone Encounter (Signed)
I just spoke with the patient to ask who supplies him O2.  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  I will let [employer] know.

## 2018-11-16 NOTE — Telephone Encounter (Signed)
Call returned to patient, he states he does not have oxygen and never has.   Call made to [employer], spoke with [place], made aware the patient does not have oxygen and never has had oxygen. He states someone from their team will call us back.

## 2018-11-16 NOTE — Telephone Encounter (Signed)
This was Levada Dy from [employer]'s response:  Mariann Barter sent to [place], Rachell Cipro, [place]; [place], Jeanie Cooks, Alex L        We need the saturation levels entered into the office note and the 3rd sat updated to state how many liters o2 he was on when he was tested. Let me know when those are updated and I will process the order.

## 2018-11-16 NOTE — Telephone Encounter (Signed)
Will route message to nurse. Once we get a response we will have MW to addend note to include saturations from snapshot.   Joel Perez please advise # of liters of oxygen this patient required while walking during his walk 06/23. I did not see liters in the snapshot. Thanks.

## 2018-11-16 NOTE — Telephone Encounter (Signed)
Mariann Barter sent to [place], Kennon Portela, Rachell Cipro, Melissa        I see the sats in plan of care have been updated, I have pulled them and sent the order to the processors

## 2018-11-16 NOTE — Telephone Encounter (Signed)
LMTCB. We need to know what company this patient has oxygen or POC with.

## 2018-11-16 NOTE — Telephone Encounter (Signed)
Pt returning call and can be reached @ [phone].Joel Perez

## 2018-11-16 NOTE — Telephone Encounter (Signed)
[facility] is calling back [phone]

## 2018-11-18 ENCOUNTER — Encounter: Payer: Self-pay | Admitting: Internal Medicine

## 2018-11-18 NOTE — Assessment & Plan Note (Signed)
No evidence of chf/ anemia, thryoid dz contributing  D dimer nl - while   high normal value (seen commonly in the elderly or chronically ill)  may miss small peripheral pe, the clot burden with sob is moderately high and the d dimer  has a very high neg pred value if used in this setting.     I had an extended discussion with the patient   reviewing all relevant studies completed to date and  lasting 15 to 20 minutes of a 25 minute visit  which included directly observing ambulatory 02 saturation study documented in a/p section of  today's  office note.  See device teaching which also extended face to face time for this visit   Each maintenance medication was reviewed in detail including most importantly the difference between maintenance and prns and under what circumstances the prns are to be triggered using an action plan format that is not reflected in the computer generated alphabetically organized AVS.     Please see AVS for specific instructions unique to this visit that I personally wrote and verbalized to the the pt in detail and then reviewed with pt  by my nurse highlighting any changes in therapy recommended at today's visit

## 2018-11-18 NOTE — Assessment & Plan Note (Signed)
Quit smoking 02/2018  - MM phenotype  - 02/09/2013  FEV1 2.14 (68%) ratio 60 p 30% better from B2 and dloc 52 corrects to 69 - 02/07/2015  extensive coaching HFA effectiveness =    90% from a baseline of 75%  - 02/07/2015  Walked RA x 3 laps @ 185 ft each stopped due to  End of study, nl pace, no sob or desat   - PFT's  03/21/2015  FEV1 1.92 (62 % ) ratio 60  p 14 % improvement from saba with DLCO  47 % corrects to 62 % for alv volume   - trial of spiriva respimat 03/21/2015  With goal to be able to walk up inclines > no better 06/23/2015 > try bevespi 2 bid x 3 weeks  -sob   Worse p 3 weeks bevespi  so resumed symb/spirva 06/25/2015  - referred to rehab 09/22/2015 >>> d/c'd p 19 sessions on 01/22/16 due to costs - 03/31/2016 rec pred x 6 days if needed  - 12/30/2016    restart spiriva 2.5 x 2 each am > could not afford it  - 04/01/2017    try bevespi  - 07/04/2017    continue beveslop and pred as Plan D but misunderstood and took daily  - 01/03/2018 daliresp trial in effort to reduced pred dependency > could not afford  - quit smoking again 02/2018  - 05/23/2018  After extensive coaching inhaler device,  effectiveness =    90% from a baseline of 75% > try symb 160 again as seems much better while on prednisone and if worse then add back spiriva  - Alpha one AT screen 05/23/2018   MM level 125  - PFT's  07/05/2018  FEV1 1.29 (43 % ) ratio 0.49  p 36 % improvement from saba p symb 160 x 2  prior to study with DLCO  48 % corrects to 67  % for alv volume    - 07/05/2018    try spiriva 2.5 x 2 puffs each am > no better on it 11/14/2018 so rec trial off  - 11/14/2018  After extensive coaching inhaler device,  effectiveness =    90% so continue symbicort 160 2bid      If finds the spiriva makes a difference consider transition to Trelegy next ov assuming insurance covers

## 2018-11-18 NOTE — Assessment & Plan Note (Signed)
05/23/2018   Walked RA x one lap = 210 ft - stopped due to  desats to 88% then walked second lap on 3lpm POC and sats 89% at end , slow pace  - 11/14/2018 Patient Saturations on Room Air at Rest = 96  %   while Ambulating = 88%   Then  2  Liters of oxygen while Ambulating = 94%  Continue 02 with ambulation, monitor sats to be sure > 90%

## 2018-11-22 ENCOUNTER — Other Ambulatory Visit: Payer: Self-pay | Admitting: Internal Medicine

## 2018-12-05 ENCOUNTER — Ambulatory Visit: Payer: Medicare Other | Admitting: Internal Medicine

## 2018-12-05 ENCOUNTER — Telehealth: Payer: Self-pay | Admitting: *Deleted

## 2018-12-05 NOTE — Telephone Encounter (Signed)
A message was left, re: follow up visit. 

## 2018-12-12 ENCOUNTER — Other Ambulatory Visit: Payer: Self-pay | Admitting: Internal Medicine

## 2018-12-15 NOTE — Telephone Encounter (Signed)
A message was left, re: follow up visit. 

## 2018-12-19 ENCOUNTER — Other Ambulatory Visit: Payer: Self-pay

## 2018-12-19 ENCOUNTER — Ambulatory Visit (INDEPENDENT_AMBULATORY_CARE_PROVIDER_SITE_OTHER): Payer: Medicare Other | Admitting: Cardiology

## 2018-12-19 ENCOUNTER — Telehealth: Payer: Self-pay | Admitting: Cardiology

## 2018-12-19 VITALS — BP 130/78 | HR 90 | Temp 100.0°F | Ht 70.5 in | Wt 229.6 lb

## 2018-12-19 DIAGNOSIS — R600 Localized edema: Secondary | ICD-10-CM | POA: Diagnosis not present

## 2018-12-19 DIAGNOSIS — I739 Peripheral vascular disease, unspecified: Secondary | ICD-10-CM

## 2018-12-19 DIAGNOSIS — I351 Nonrheumatic aortic (valve) insufficiency: Secondary | ICD-10-CM

## 2018-12-19 DIAGNOSIS — J9611 Chronic respiratory failure with hypoxia: Secondary | ICD-10-CM | POA: Diagnosis not present

## 2018-12-19 MED ORDER — FUROSEMIDE 20 MG PO TABS: ORAL_TABLET | ORAL | 0 refills | Status: DC

## 2018-12-19 MED ORDER — POTASSIUM CHLORIDE CRYS ER 20 MEQ PO TBCR: 20.0000 meq | EXTENDED_RELEASE_TABLET | Freq: Every day | ORAL | 0 refills | Status: DC

## 2018-12-19 NOTE — Patient Instructions (Addendum)
Medication Instructions:  START Lasix 40mg  Take 2 tablset once a day for 2 days then take 1(ONE) 20mg  tablet once a day  START Potassium 57meq Take 1 tablet for 2 days then stop If you need a refill on your cardiac medications before your next appointment, please call your pharmacy.   Lab work: None  If you have labs (blood work) drawn today and your tests are completely normal, you will receive your results only by: Marland Kitchen MyChart Message (if you have MyChart) OR . A paper copy in the mail If you have any lab test that is abnormal or we need to change your treatment, we will call you to review the results.  Testing/Procedures: Your physician has requested that you have an echocardiogram. Echocardiography is a painless test that uses sound waves to create images of your heart. It provides your doctor with information about the size and shape of your heart and how well your heart's chambers and valves are working. This procedure takes approximately one hour. There are no restrictions for this procedure. [place]  Follow-Up: At [facility], you and your health needs are our priority.  As part of our continuing mission to provide you with exceptional heart care, we have created designated Provider Care Teams.  These Care Teams include your primary Cardiologist (physician) and Advanced Practice Providers (APPs -  Physician Assistants and Nurse Practitioners) who all work together to provide you with the care you need, when you need it. You will need a follow up appointment in 3-4 weeks.  Please call our office 2 months in advance to schedule this appointment.  You may see Kathlyn Sacramento, MD or one of the following Advanced Practice Providers on your designated Care Team:   Kerin Ransom, PA-C Roby Lofts, [place] . Sande Rives, PA-C  Any Other Special Instructions Will Be Listed Below (If Applicable).   Low-Sodium Eating Plan Sodium, which is an element that makes up salt,  helps you maintain a healthy balance of fluids in your body. Too much sodium can increase your blood pressure and cause fluid and waste to be held in your body. Your health care provider or dietitian may recommend following this plan if you have high blood pressure (hypertension), kidney disease, liver disease, or heart failure. Eating less sodium can help lower your blood pressure, reduce swelling, and protect your heart, liver, and kidneys. What are tips for following this plan? General guidelines  Most people on this plan should limit their sodium intake to 1,500-2,000 mg (milligrams) of sodium each day. Reading food labels   The Nutrition Facts label lists the amount of sodium in one serving of the food. If you eat more than one serving, you must multiply the listed amount of sodium by the number of servings.  Choose foods with less than 140 mg of sodium per serving.  Avoid foods with 300 mg of sodium or more per serving. Shopping  Look for lower-sodium products, often labeled as "low-sodium" or "no salt added."  Always check the sodium content even if foods are labeled as "unsalted" or "no salt added".  Buy fresh foods. ? Avoid canned foods and premade or frozen meals. ? Avoid canned, cured, or processed meats  Buy breads that have less than 80 mg of sodium per slice. Cooking  Eat more home-cooked food and less restaurant, buffet, and fast food.  Avoid adding salt when cooking. Use salt-free seasonings or herbs instead of table salt or sea salt. Check  with your health care provider or pharmacist before using salt substitutes.  Cook with plant-based oils, such as canola, sunflower, or olive oil. Meal planning  When eating at a restaurant, ask that your food be prepared with less salt or no salt, if possible.  Avoid foods that contain MSG (monosodium glutamate). MSG is sometimes added to [place] food, bouillon, and some canned foods. What foods are recommended? The items  listed may not be a complete list. Talk with your dietitian about what dietary choices are best for you. Grains Low-sodium cereals, including oats, puffed wheat and rice, and shredded wheat. Low-sodium crackers. Unsalted rice. Unsalted pasta. Low-sodium bread. Whole-grain breads and whole-grain pasta. Vegetables Fresh or frozen vegetables. "No salt added" canned vegetables. "No salt added" tomato sauce and paste. Low-sodium or reduced-sodium tomato and vegetable juice. Fruits Fresh, frozen, or canned fruit. Fruit juice. Meats and other protein foods Fresh or frozen (no salt added) meat, poultry, seafood, and fish. Low-sodium canned tuna and salmon. Unsalted nuts. Dried peas, beans, and lentils without added salt. Unsalted canned beans. Eggs. Unsalted nut butters. Dairy Milk. Soy milk. Cheese that is naturally low in sodium, such as ricotta cheese, fresh mozzarella, or Swiss cheese Low-sodium or reduced-sodium cheese. Cream cheese. Yogurt. Fats and oils Unsalted butter. Unsalted margarine with no trans fat. Vegetable oils such as canola or olive oils. Seasonings and other foods Fresh and dried herbs and spices. Salt-free seasonings. Low-sodium mustard and ketchup. Sodium-free salad dressing. Sodium-free light mayonnaise. Fresh or refrigerated horseradish. Lemon juice. Vinegar. Homemade, reduced-sodium, or low-sodium soups. Unsalted popcorn and pretzels. Low-salt or salt-free chips. What foods are not recommended? The items listed may not be a complete list. Talk with your dietitian about what dietary choices are best for you. Grains Instant hot cereals. Bread stuffing, pancake, and biscuit mixes. Croutons. Seasoned rice or pasta mixes. Noodle soup cups. Boxed or frozen macaroni and cheese. Regular salted crackers. Self-rising flour. Vegetables Sauerkraut, pickled vegetables, and relishes. Olives. [place] fries. Onion rings. Regular canned vegetables (not low-sodium or reduced-sodium). Regular  canned tomato sauce and paste (not low-sodium or reduced-sodium). Regular tomato and vegetable juice (not low-sodium or reduced-sodium). Frozen vegetables in sauces. Meats and other protein foods Meat or fish that is salted, canned, smoked, spiced, or pickled. Bacon, ham, sausage, hotdogs, corned beef, chipped beef, packaged lunch meats, salt pork, jerky, pickled herring, anchovies, regular canned tuna, sardines, salted nuts. Dairy Processed cheese and cheese spreads. Cheese curds. Blue cheese. Feta cheese. String cheese. Regular cottage cheese. Buttermilk. Canned milk. Fats and oils Salted butter. Regular margarine. Ghee. Bacon fat. Seasonings and other foods Onion salt, garlic salt, seasoned salt, table salt, and sea salt. Canned and packaged gravies. Worcestershire sauce. Tartar sauce. Barbecue sauce. Teriyaki sauce. Soy sauce, including reduced-sodium. Steak sauce. Fish sauce. Oyster sauce. Cocktail sauce. Horseradish that you find on the shelf. Regular ketchup and mustard. Meat flavorings and tenderizers. Bouillon cubes. Hot sauce and Tabasco sauce. Premade or packaged marinades. Premade or packaged taco seasonings. Relishes. Regular salad dressings. Salsa. Potato and tortilla chips. Corn chips and puffs. Salted popcorn and pretzels. Canned or dried soups. Pizza. Frozen entrees and pot pies. Summary  Eating less sodium can help lower your blood pressure, reduce swelling, and protect your heart, liver, and kidneys.  Most people on this plan should limit their sodium intake to 1,500-2,000 mg (milligrams) of sodium each day.  Canned, boxed, and frozen foods are high in sodium. Restaurant foods, fast foods, and pizza are also very high in sodium. You  also get sodium by adding salt to food.  Try to cook at home, eat more fresh fruits and vegetables, and eat less fast food, canned, processed, or prepared foods. This information is not intended to replace advice given to you by your health care  provider. Make sure you discuss any questions you have with your health care provider. Document Released: 10/30/2001 Document Revised: 04/22/2017 Document Reviewed: 05/03/2016 Elsevier Patient Education  2020 [employer].

## 2018-12-19 NOTE — Assessment & Plan Note (Signed)
Acute exacerbation of a chronic problem- add Lasix 40 mg QD x 2, then 20 mg daily

## 2018-12-19 NOTE — Progress Notes (Signed)
Cardiology Office Note:    Date:  12/19/2018   ID:  Joel Perez, DOB Dec 31, 1945, MRN [id number]  PCP:  Joel Contras, MD  Cardiologist:  Joel Sacramento, MD  Electrophysiologist:  None   Referring MD: Joel Contras, MD   LE edema  History of Present Illness:    Joel Perez is a 73 y.o. male with a hx of chronic LE edema, COPD now on home O2, moderate LEA PVD with normal ABIs, and a history of aortic regurgitation and diastolic CHF.  He is in the office today with complaints of LE edema .  He says this is OK in the morning but worse by the end of the day.  He has been evaluated by [facility] in the past and according Dr Joel Perez note he did not have venous reflux. He saw Dr Joel Perez last month and labs were drawn including a BNP (32).  In the office the patient was significantly SOB with exertion.  He says he can't take wearing a mask (COVID) and he did not bring his O2 with him to the office.   Past Medical History:  Diagnosis Date  . 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 BPH (benign prostatic hyperplasia)   . Capsular cataract of left eye   . COPD (chronic obstructive pulmonary disease) (HCC)    Dr. Melvyn Perez  . Current smoker    Long-term smoker. Not interested in quitting  . Diverticulosis    With intermittent diverticulitis  . Erectile dysfunction   . Essential hypertension    Several recorded blood pressures greater than 140/90.  . H/O mitral valve prolapse    By report, but not confirmed by echo.  . Low back pain    Chronic. Followed by Dr. Suella Perez  . Mild aortic regurgitation 12/2013   Mild to moderate aortic regurgitation on echo  . Neuropathy     Past Surgical History:  Procedure Laterality Date  . COLON SURGERY  2000  . ELBOW SURGERY    . TRANSTHORACIC ECHOCARDIOGRAM  12/29/2016   EF 60-65%. Moderate LVH. Mild aortic valve calcification. Mild to moderate regurgitation with no stenosis. Mitral valve annular calcification with no  comment of prolapse or regurgitation.    Current Medications: Current Meds  Medication Sig  . albuterol (PROVENTIL) (2.5 MG/3ML) 0.083% nebulizer solution USE ONE VIAL IN NEBULIZER EVERY 6 HOURS AS NEEDED FOR WHEEZING OR SHORTNESS OF BREATH  . aspirin 81 MG tablet Take 81 mg by mouth daily.  Marland Kitchen atorvastatin (LIPITOR) 40 MG tablet Take 1 tablet by mouth daily  . b complex vitamins tablet Take 1 tablet by mouth daily.  . budesonide-formoterol (SYMBICORT) 160-4.5 MCG/ACT inhaler Take 2 puffs first thing in am and then another 2 puffs about 12 hours later.  . gabapentin (NEURONTIN) 300 MG capsule TAKE 1 CAPSULE BY MOUTH AT BEDTIME FOR  1  WEEK  THEN  INCREASE  TO  1  CAPSULE  TWICE  DAILY  . losartan (COZAAR) 25 MG tablet Take 25 mg by mouth daily.  . Multiple Vitamin (MULTIVITAMIN) capsule Take 1 capsule by mouth daily.  Marland Kitchen oxyCODONE-acetaminophen (PERCOCET/ROXICET) 5-325 MG per tablet Take 1 tablet by mouth every 6 (six) hours as needed.  . predniSONE (DELTASONE) 10 MG tablet Take 2 tablets by mouth once daily  . PROAIR HFA 108 (90 Base) MCG/ACT inhaler INHALE 2 PUFFS INTO LUNGS EVERY 6 HOURS AS NEEDED FOR WHEEZING AND FOR SHORTNESS OF BREATH  .  Sildenafil Citrate (VIAGRA PO) Take 1 tablet by mouth as needed.  . sodium chloride (OCEAN) 0.65 % nasal spray Place 1 spray into the nose as needed for congestion.  . Tiotropium Bromide Monohydrate (SPIRIVA RESPIMAT) 2.5 MCG/ACT AERS Inhale 2 puffs into the lungs daily.  . [DISCONTINUED] Cyclobenzaprine HCl (FLEXERIL PO) Take 1 tablet by mouth 3 (three) times daily as needed.     Allergies:   Codeine and Sulfa antibiotics   Social History   Socioeconomic History  . Marital status: Married    Spouse name: Not on file  . Number of children: Not on file  . Years of education: Not on file  . Highest education level: Not on file  Occupational History  . Occupation: [employer]   Social Needs  . Financial resource strain: Not on file  .  Food insecurity    Worry: Not on file    Inability: Not on file  . Transportation needs    Medical: Not on file    Non-medical: Not on file  Tobacco Use  . Smoking status: Former Smoker    Packs/day: 2.00    Years: 50.00    Pack years: 100.00    Types: Cigarettes    Quit date: 03/16/2018    Years since quitting: 0.7  . Smokeless tobacco: Never Used  . Tobacco comment: 1/2 ppd 12/30/16-lmr  Substance and Sexual Activity  . Alcohol use: Yes    Alcohol/week: 0.0 standard drinks    Comment: 2 beers a day "couple beers"  . Drug use: No  . Sexual activity: Not on file  Lifestyle  . Physical activity    Days per week: Not on file    Minutes per session: Not on file  . Stress: Not on file  Relationships  . Social Herbalist on phone: Not on file    Gets together: Not on file    Attends religious service: Not on file    Active member of club or organization: Not on file    Attends meetings of clubs or organizations: Not on file    Relationship status: Not on file  Other Topics Concern  . Not on file  Social History Narrative   Retired Animal nutritionist.   He lives with wife.    Highest level of education:  Tr[employer] school     Family History: The patient's family history includes Brain cancer in his brother; Breast cancer in his mother; Heart disease in his father; Other in his sister.  ROS:   Please see the history of present illness.     All other systems reviewed and are negative.  EKGs/Labs/Other Studies Reviewed:    The following studies were reviewed today:  EKG:  EKG is ordered today.  The ekg ordered today demonstrates NSR-90, IVCD, LVH, poor anterior RW  Recent Labs: 11/14/2018: ALT 39; BUN 15; Creatinine, Ser 0.89; Hemoglobin 15.7; Platelets 233.0; Potassium 4.1; Pro B Natriuretic peptide (BNP) 32.0; Sodium 140; TSH 2.00  Recent Lipid Panel    Component Value Date/Time   CHOL 154 04/08/2017 1504   TRIG 114 04/08/2017 1504   HDL 48 04/08/2017 1504    CHOLHDL 3.2 04/08/2017 1504   LDLCALC 83 04/08/2017 1504    Physical Exam:    VS:  BP 130/78   Pulse 90   Temp 100 F (37.8 C) (Temporal)   Ht 5' 10.5" (1.791 m)   Wt 229 lb 9.6 oz (104.1 kg)   SpO2 91%  BMI 32.48 kg/m     Wt Readings from Last 3 Encounters:  12/19/18 229 lb 9.6 oz (104.1 kg)  11/14/18 225 lb 9.6 oz (102.3 kg)  07/05/18 217 lb 3.2 oz (98.5 kg)     GEN:  Well nourished, well developed in no acute distress HEENT: Normal NECK: No JVD; No carotid bruits LYMPHATICS: No lymphadenopathy CARDIAC: RRR, no murmurs, rubs, gallops RESPIRATORY:  Decreased breath sounds ABDOMEN: Soft, non-tender, non-distended MUSCULOSKELETAL:  Tense LE edema with chronic skin changes and a small area of weeping RLE. No deformity  SKIN: Warm and dry NEUROLOGIC:  Alert and oriented x 3 PSYCHIATRIC:  Normal affect   ASSESSMENT:    Edema of both legs Acute exacerbation of a chronic problem- add Lasix 40 mg QD x 2, then 20 mg daily  Chronic respiratory failure with hypoxia (HCC) He appears to have significant COPD- now on chronic O2  PVD (peripheral vascular disease) (HCC) Moderate iliac and CFA disease by doppler, normal ABIs- medical Rx  PLAN:    Add lasix as above.  Check echo.  F/U with Dr April Holding or myself in a couple of weeks.    Medication Adjustments/Labs and Tests Ordered: Current medicines are reviewed at length with the patient today.  Concerns regarding medicines are outlined above.  Orders Placed This Encounter  Procedures  . ECHOCARDIOGRAM COMPLETE   Meds ordered this encounter  Medications  . furosemide (LASIX) 20 MG tablet    Sig: TAKE 2 TABLETS FOR 2 DAYS THEN TAKE 1 TABLET EVERY DAY    Dispense:  30 tablet    Refill:  0  . potassium chloride SA (KLOR-CON M20) 20 MEQ tablet    Sig: Take 1 tablet (20 mEq total) by mouth daily.    Dispense:  10 tablet    Refill:  0    Patient Instructions  Medication Instructions:  START Lasix 40mg  Take 2 tablset once  a day for 2 days then take 1 20mg  tablet once a day  START Potassium 82meq Take 1 tablet for 2 days then stop If you need a refill on your cardiac medications before your next appointment, please call your pharmacy.   Lab work: None  If you have labs (blood work) drawn today and your tests are completely normal, you will receive your results only by: Marland Kitchen MyChart Message (if you have MyChart) OR . A paper copy in the mail If you have any lab test that is abnormal or we need to change your treatment, we will call you to review the results.  Testing/Procedures: Your physician has requested that you have an echocardiogram. Echocardiography is a painless test that uses sound waves to create images of your heart. It provides your doctor with information about the size and shape of your heart and how well your heart's chambers and valves are working. This procedure takes approximately one hour. There are no restrictions for this procedure. [place]  Follow-Up: At [facility], you and your health needs are our priority.  As part of our continuing mission to provide you with exceptional heart care, we have created designated Provider Care Teams.  These Care Teams include your primary Cardiologist (physician) and Advanced Practice Providers (APPs -  Physician Assistants and Nurse Practitioners) who all work together to provide you with the care you need, when you need it. You will need a follow up appointment in 3-4 weeks.  Please call our office 2 months in advance to schedule this appointment.  You may see Joel Sacramento, MD or one of the following Advanced Practice Providers on your designated Care Team:   Kerin Ransom, PA-C Roby Lofts, [place] . Sande Rives, PA-C  Any Other Special Instructions Will Be Listed Below (If Applicable).   Low-Sodium Eating Plan Sodium, which is an element that makes up salt, helps you maintain a healthy balance of fluids in your body. Too much  sodium can increase your blood pressure and cause fluid and waste to be held in your body. Your health care provider or dietitian may recommend following this plan if you have high blood pressure (hypertension), kidney disease, liver disease, or heart failure. Eating less sodium can help lower your blood pressure, reduce swelling, and protect your heart, liver, and kidneys. What are tips for following this plan? General guidelines  Most people on this plan should limit their sodium intake to 1,500-2,000 mg (milligrams) of sodium each day. Reading food labels   The Nutrition Facts label lists the amount of sodium in one serving of the food. If you eat more than one serving, you must multiply the listed amount of sodium by the number of servings.  Choose foods with less than 140 mg of sodium per serving.  Avoid foods with 300 mg of sodium or more per serving. Shopping  Look for lower-sodium products, often labeled as "low-sodium" or "no salt added."  Always check the sodium content even if foods are labeled as "unsalted" or "no salt added".  Buy fresh foods. ? Avoid canned foods and premade or frozen meals. ? Avoid canned, cured, or processed meats  Buy breads that have less than 80 mg of sodium per slice. Cooking  Eat more home-cooked food and less restaurant, buffet, and fast food.  Avoid adding salt when cooking. Use salt-free seasonings or herbs instead of table salt or sea salt. Check with your health care provider or pharmacist before using salt substitutes.  Cook with plant-based oils, such as canola, sunflower, or olive oil. Meal planning  When eating at a restaurant, ask that your food be prepared with less salt or no salt, if possible.  Avoid foods that contain MSG (monosodium glutamate). MSG is sometimes added to [place] food, bouillon, and some canned foods. What foods are recommended? The items listed may not be a complete list. Talk with your dietitian about what  dietary choices are best for you. Grains Low-sodium cereals, including oats, puffed wheat and rice, and shredded wheat. Low-sodium crackers. Unsalted rice. Unsalted pasta. Low-sodium bread. Whole-grain breads and whole-grain pasta. Vegetables Fresh or frozen vegetables. "No salt added" canned vegetables. "No salt added" tomato sauce and paste. Low-sodium or reduced-sodium tomato and vegetable juice. Fruits Fresh, frozen, or canned fruit. Fruit juice. Meats and other protein foods Fresh or frozen (no salt added) meat, poultry, seafood, and fish. Low-sodium canned tuna and salmon. Unsalted nuts. Dried peas, beans, and lentils without added salt. Unsalted canned beans. Eggs. Unsalted nut butters. Dairy Milk. Soy milk. Cheese that is naturally low in sodium, such as ricotta cheese, fresh mozzarella, or Swiss cheese Low-sodium or reduced-sodium cheese. Cream cheese. Yogurt. Fats and oils Unsalted butter. Unsalted margarine with no trans fat. Vegetable oils such as canola or olive oils. Seasonings and other foods Fresh and dried herbs and spices. Salt-free seasonings. Low-sodium mustard and ketchup. Sodium-free salad dressing. Sodium-free light mayonnaise. Fresh or refrigerated horseradish. Lemon juice. Vinegar. Homemade, reduced-sodium, or low-sodium soups. Unsalted popcorn and pretzels. Low-salt or salt-free chips. What foods are not recommended? The items listed  may not be a complete list. Talk with your dietitian about what dietary choices are best for you. Grains Instant hot cereals. Bread stuffing, pancake, and biscuit mixes. Croutons. Seasoned rice or pasta mixes. Noodle soup cups. Boxed or frozen macaroni and cheese. Regular salted crackers. Self-rising flour. Vegetables Sauerkraut, pickled vegetables, and relishes. Olives. [place] fries. Onion rings. Regular canned vegetables (not low-sodium or reduced-sodium). Regular canned tomato sauce and paste (not low-sodium or reduced-sodium). Regular  tomato and vegetable juice (not low-sodium or reduced-sodium). Frozen vegetables in sauces. Meats and other protein foods Meat or fish that is salted, canned, smoked, spiced, or pickled. Bacon, ham, sausage, hotdogs, corned beef, chipped beef, packaged lunch meats, salt pork, jerky, pickled herring, anchovies, regular canned tuna, sardines, salted nuts. Dairy Processed cheese and cheese spreads. Cheese curds. Blue cheese. Feta cheese. String cheese. Regular cottage cheese. Buttermilk. Canned milk. Fats and oils Salted butter. Regular margarine. Ghee. Bacon fat. Seasonings and other foods Onion salt, garlic salt, seasoned salt, table salt, and sea salt. Canned and packaged gravies. Worcestershire sauce. Tartar sauce. Barbecue sauce. Teriyaki sauce. Soy sauce, including reduced-sodium. Steak sauce. Fish sauce. Oyster sauce. Cocktail sauce. Horseradish that you find on the shelf. Regular ketchup and mustard. Meat flavorings and tenderizers. Bouillon cubes. Hot sauce and Tabasco sauce. Premade or packaged marinades. Premade or packaged taco seasonings. Relishes. Regular salad dressings. Salsa. Potato and tortilla chips. Corn chips and puffs. Salted popcorn and pretzels. Canned or dried soups. Pizza. Frozen entrees and pot pies. Summary  Eating less sodium can help lower your blood pressure, reduce swelling, and protect your heart, liver, and kidneys.  Most people on this plan should limit their sodium intake to 1,500-2,000 mg (milligrams) of sodium each day.  Canned, boxed, and frozen foods are high in sodium. Restaurant foods, fast foods, and pizza are also very high in sodium. You also get sodium by adding salt to food.  Try to cook at home, eat more fresh fruits and vegetables, and eat less fast food, canned, processed, or prepared foods. This information is not intended to replace advice given to you by your health care provider. Make sure you discuss any questions you have with your health care  provider. Document Released: 10/30/2001 Document Revised: 04/22/2017 Document Reviewed: 05/03/2016 Elsevier Patient Education  2020 [place], Kerin Ransom, [place]  12/19/2018 12:20 PM    [facility]

## 2018-12-19 NOTE — Assessment & Plan Note (Signed)
He appears to have significant COPD- now on chronic O2

## 2018-12-19 NOTE — Assessment & Plan Note (Signed)
Moderate iliac and CFA disease by doppler, normal ABIs- medical Rx

## 2018-12-19 NOTE — Telephone Encounter (Signed)
LVM, reminding pt of his appt on 12-19-18, left word to call back to be pre-screened for COVID-19.

## 2018-12-27 ENCOUNTER — Ambulatory Visit (HOSPITAL_COMMUNITY): Payer: Medicare Other | Attending: Internal Medicine

## 2018-12-27 ENCOUNTER — Other Ambulatory Visit: Payer: Self-pay

## 2018-12-27 DIAGNOSIS — R600 Localized edema: Secondary | ICD-10-CM | POA: Diagnosis not present

## 2018-12-27 DIAGNOSIS — I351 Nonrheumatic aortic (valve) insufficiency: Secondary | ICD-10-CM | POA: Diagnosis present

## 2019-01-04 ENCOUNTER — Ambulatory Visit (INDEPENDENT_AMBULATORY_CARE_PROVIDER_SITE_OTHER): Payer: Medicare Other | Admitting: General Practice

## 2019-01-04 ENCOUNTER — Other Ambulatory Visit: Payer: Self-pay

## 2019-01-04 ENCOUNTER — Encounter: Payer: Self-pay | Admitting: Cardiology

## 2019-01-04 VITALS — BP 126/58 | HR 100 | Temp 98.4°F | Ht 70.5 in | Wt 223.6 lb

## 2019-01-04 DIAGNOSIS — R600 Localized edema: Secondary | ICD-10-CM

## 2019-01-04 DIAGNOSIS — I10 Essential (primary) hypertension: Secondary | ICD-10-CM

## 2019-01-04 DIAGNOSIS — J9611 Chronic respiratory failure with hypoxia: Secondary | ICD-10-CM

## 2019-01-04 DIAGNOSIS — I739 Peripheral vascular disease, unspecified: Secondary | ICD-10-CM

## 2019-01-04 MED ORDER — FUROSEMIDE 20 MG PO TABS: 20.0000 mg | ORAL_TABLET | Freq: Every day | ORAL | 6 refills | Status: DC

## 2019-01-04 NOTE — Progress Notes (Signed)
Cardiology Clinic Note   Patient Name: NAMIR NETO Date of Encounter: 01/04/2019  Primary Care Provider:  [name], MD Primary Cardiologist:  Kathlyn Sacramento, MD  Patient Profile    Mr. Eydan a Mateo 73 year old male presents for follow-up of his lower extremity edema.  Past Medical History    Past Medical History:  Diagnosis Date   Blind right eye 1985   Following a work accident   BPH (benign prostatic hyperplasia)    Capsular cataract of left eye    COPD (chronic obstructive pulmonary disease) ([place])    Dr. [name]   Current smoker    Long-term smoker. Not interested in quitting   Diverticulosis    With intermittent diverticulitis   Erectile dysfunction    Essential hypertension    Several recorded blood pressures greater than 140/90.   H/O mitral valve prolapse    By report, but not confirmed by echo.   Low back pain    Chronic. Followed by Dr. Suella Broad   Mild aortic regurgitation 12/2013   Mild to moderate aortic regurgitation on echo   Neuropathy    Past Surgical History:  Procedure Laterality Date   COLON SURGERY  2000   ELBOW SURGERY     TRANSTHORACIC ECHOCARDIOGRAM  12/29/2016   EF 60-65%. Moderate LVH. Mild aortic valve calcification. Mild to moderate regurgitation with no stenosis. Mitral valve annular calcification with no comment of prolapse or regurgitation.    Allergies  Allergies  Allergen Reactions   Codeine    Sulfa Antibiotics Hives    History of Present Illness    Mr. Jennifer was last seen by Kerin Ransom, PA-C on 12/19/2018.  During that time he complained of increased lower extremity edema that was worse in the evening.  He had been evaluated by vein and vascular specialist and ruled out for venous reflux.  He was seen by Dr. [name] in June and blood work was drawn including a BNP which was 32.  While he was in the office he had significant S OB with exertion and stated he could not wear a mask (COVID) and did not bring  his home O2.  Mr. Roverto has COPD and is now on home O2.  His Lasix was changed to 40 mg daily x2 days and then he was to resume his 20 mg daily dose.  He also had an echocardiogram on 12/27/2018 which showed an LVEF of 60 to 65%, impaired diastolic relaxation, mild aortic regurgitation, and mild to moderate stenosis of aortic valve.  His PMH also includes peripheral vascular disease COPD stage III, chronic respiratory failure with hypoxia, alcoholic neuropathy, lower extremity edema, decreased pedal pulses, and DOE.  He presents to the clinic today and states he feels much better today.  He is gone back to mowing his grass on his riding mower and doing activities around his house.  He states his breathing is back to normal, using 3 L of home oxygen.  He also states he feels his lower legs are much less swollen.  He states he has tried to stay away from high sodium foods.  He denies chest pain, increased shortness of breath, lower extremity edema, fatigue, palpitations, melena, hematuria, hemoptysis, diaphoresis, weakness, presyncope, syncope, orthopnea, and PND.  Home Medications    Prior to Admission medications   Medication Sig Start Date End Date Taking? Authorizing Provider  albuterol (PROVENTIL) (2.5 MG/3ML) 0.083% nebulizer solution USE ONE VIAL IN NEBULIZER EVERY 6 HOURS AS NEEDED FOR WHEEZING OR SHORTNESS  OF BREATH 09/21/17   Tanda Rockers, MD  aspirin 81 MG tablet Take 81 mg by mouth daily.    [provider]  atorvastatin (LIPITOR) 40 MG tablet Take 1 tablet by mouth daily 08/02/18   Wellington Hampshire, MD  b complex vitamins tablet Take 1 tablet by mouth daily.    [provider]  budesonide-formoterol (SYMBICORT) 160-4.5 MCG/ACT inhaler Take 2 puffs first thing in am and then another 2 puffs about 12 hours later. 05/23/18   Tanda Rockers, MD  cyclobenzaprine (FLEXERIL) 10 MG tablet Take 10 mg by mouth 3 (three) times daily as needed. 11/22/18   [provider]    furosemide (LASIX) 20 MG tablet TAKE 2 TABLETS FOR 2 DAYS THEN TAKE 1 TABLET EVERY DAY 12/19/18   Kilroy, Doreene Burke, PA-C  gabapentin (NEURONTIN) 300 MG capsule TAKE 1 CAPSULE BY MOUTH AT BEDTIME FOR  1  WEEK  THEN  INCREASE  TO  1  CAPSULE  TWICE  DAILY 03/11/17   Patel, Donika K, DO  losartan (COZAAR) 25 MG tablet Take 25 mg by mouth daily.    [provider]  Multiple Vitamin (MULTIVITAMIN) capsule Take 1 capsule by mouth daily.    [provider]  oxyCODONE-acetaminophen (PERCOCET/ROXICET) 5-325 MG per tablet Take 1 tablet by mouth every 6 (six) hours as needed. 01/10/15   [provider]  potassium chloride SA (KLOR-CON M20) 20 MEQ tablet Take 1 tablet (20 mEq total) by mouth daily. 12/19/18   Erlene Quan, PA-C  predniSONE (DELTASONE) 10 MG tablet Take 2 tablets by mouth once daily 12/12/18   Tanda Rockers, MD  PROAIR HFA 108 [phone] Base) MCG/ACT inhaler INHALE 2 PUFFS INTO LUNGS EVERY 6 HOURS AS NEEDED FOR WHEEZING AND FOR SHORTNESS OF BREATH 11/23/18   Tanda Rockers, MD  Sildenafil Citrate (VIAGRA PO) Take 1 tablet by mouth as needed.    [provider]  sodium chloride (OCEAN) 0.65 % nasal spray Place 1 spray into the nose as needed for congestion.    [provider]  Tiotropium Bromide Monohydrate (SPIRIVA RESPIMAT) 2.5 MCG/ACT AERS Inhale 2 puffs into the lungs daily. 07/05/18   Tanda Rockers, MD    Family History    Family History  Problem Relation Age of Onset   Heart disease Father    Breast cancer Mother    Other Sister    Brain cancer Brother    He indicated that his mother is deceased. He indicated that his father is deceased. He indicated that his sister is deceased. He indicated that his brother is deceased.  Social History    Social History   Socioeconomic History   Marital status: Married    Spouse name: Not on file   Number of children: Not on file   Years of education: Not on file   Highest education level: Not  on file  Occupational History   Occupation: White Proofreader strain: Not on file   Food insecurity    Worry: Not on file    Inability: Not on file   Transportation needs    Medical: Not on file    Non-medical: Not on file  Tobacco Use   Smoking status: Former Smoker    Packs/day: 2.00    Years: 50.00    Pack years: 100.00    Types: Cigarettes    Quit date: 03/16/2018    Years since quitting: 0.8  Smokeless tobacco: Never Used   Tobacco comment: 1/2 ppd 12/30/16-lmr  Substance and Sexual Activity   Alcohol use: Yes    Alcohol/week: 0.0 standard drinks    Comment: 2 beers a day "couple beers"   Drug use: No   Sexual activity: Not on file  Lifestyle   Physical activity    Days per week: Not on file    Minutes per session: Not on file   Stress: Not on file  Relationships   Social connections    Talks on phone: Not on file    Gets together: Not on file    Attends religious service: Not on file    Active member of club or organization: Not on file    Attends meetings of clubs or organizations: Not on file    Relationship status: Not on file   Intimate partner violence    Fear of current or ex partner: Not on file    Emotionally abused: Not on file    Physically abused: Not on file    Forced sexual activity: Not on file  Other Topics Concern   Not on file  Social History Narrative   Retired Animal nutritionist.   He lives with wife.    Highest level of education:  [employer] school     Review of Systems    General:  No chills, fever, night sweats or weight changes.  Cardiovascular:  No chest pain, dyspnea on exertion, edema, orthopnea, palpitations, paroxysmal nocturnal dyspnea. Dermatological: No rash, lesions/masses Respiratory: No cough, dyspnea Urologic: No hematuria, dysuria Abdominal:   No nausea, vomiting, diarrhea, bright red blood per rectum, melena, or hematemesis Neurologic:  No visual changes, wkns,  changes in mental status. All other systems reviewed and are otherwise negative except as noted above.  Physical Exam    VS:  BP (!) 126/58 (BP Location: Left Arm, Patient Position: Sitting, Cuff Size: Large)    Pulse 100    Temp 98.4 F (36.9 C) (Temporal)    Ht 5' 10.5" (1.791 m)    Wt 223 lb 9.6 oz (101.4 kg)    SpO2 94%    BMI 31.63 kg/m  , BMI Body mass index is 31.63 kg/m. GEN: Well nourished, well developed, in no acute distress. HEENT: normal. Neck: Supple, no JVD, carotid bruits, or masses. Cardiac: RRR, no murmurs, rubs, or gallops. No clubbing, cyanosis, slight bilateral lower extremity edema.  Radials/DP/PT 2+ and equal bilaterally.  Respiratory:  Respirations regular and unlabored, clear to auscultation bilaterally. GI: Soft, nontender, nondistended, BS + x 4. MS: no deformity or atrophy. Skin: warm and dry, no rash. Neuro:  Strength and sensation are intact. Psych: Normal affect.  Accessory Clinical Findings    ECG personally reviewed by me today- none today.  EKG 12/19/2018: Sinus rhythm with first-degree AV block 90 bpm  EKG 02/14/2018: Sinus rhythm with premature atrial complexes 93 bpm  Echocardiogram 12/27/2018: IMPRESSIONS    1. The left ventricle has normal systolic function with an ejection fraction of 60-65%. The cavity size was normal. There is mild concentric left ventricular hypertrophy. Left ventricular diastolic Doppler parameters are consistent with impaired  relaxation. Indeterminate filling pressures No evidence of left ventricular regional wall motion abnormalities.  2. The right ventricle has normal systolic function. The cavity was normal. There is no increase in right ventricular wall thickness.  3. The aortic valve is tricuspid. Mild thickening of the aortic valve. Mild calcification of the aortic valve. Aortic valve regurgitation is mild by color flow  Doppler. Mild-moderate stenosis of the aortic valve. Severe aortic annular calcification    noted.  4. The mitral valve is grossly normal. Mild thickening of the mitral valve leaflet. There is mild mitral annular calcification present.  5. The tricuspid valve is grossly normal.  6. The aorta is normal in size and structure  Echocardiogram 12/29/2016: Study Conclusions  - Left ventricle: The cavity size was normal. Wall thickness was   increased in a pattern of moderate LVH. Systolic function was   normal. The estimated ejection fraction was in the range of 60%   to 65%. Wall motion was normal; there were no regional wall   motion abnormalities. Doppler parameters are consistent with   abnormal left ventricular relaxation (grade 1 diastolic   dysfunction). - Aortic valve: Mildly calcified annulus. Trileaflet; mildly   thickened leaflets. There was mild to moderate regurgitation.   Valve area (VTI): 2.32 cm^2. Valve area (Vmax): 2.22 cm^2. - Mitral valve: Mildly calcified annulus. Normal thickness leaflets  Assessment & Plan   1.  Bilateral lower extremity edema- slight Bilateral LEE  Continue furosemide 20 mg daily Encourage lower extremity support stockings Elevate lower extremities for 30 minutes at a time Low-sodium diet-instructions provided(salty six) Order BMP  2.  COPD/chronic respiratory failure with hypoxia- on home oxygen Continue albuterol 2.5 mg nebulizers every 6 hours as needed Continue Symbicort 160-4.5 mcg inhaler 2 puffs every 12 hours Continue furosemide 20 mg tablet daily  3.  Peripheral vascular disease- iliac/CFA disease, normal ABIs 01/2017 No further tests at this time  4.  Essential hypertension- well-controlled, 126/58 today Continue losartan 25 mg tablet daily Low-sodium heart healthy diet Increase physical activity as tolerated Daily weights   Disposition: Follow-up with Dr. Fletcher Anon in 3 months.  Deberah Pelton, NP 01/04/2019, 2:28 PM

## 2019-01-04 NOTE — Patient Instructions (Signed)
Medication Instructions:  CONTINUE Taking Lasix 20mg  once a day  If you need a refill on your cardiac medications before your next appointment, please call your pharmacy.   Lab work: Your physician recommends that you return for lab work in: TODAY-BMET If you have labs (blood work) drawn today and your tests are completely normal, you will receive your results only by: Marland Kitchen MyChart Message (if you have MyChart) OR . A paper copy in the mail If you have any lab test that is abnormal or we need to change your treatment, we will call you to review the results.  Testing/Procedures: NONE   Follow-Up: At [facility], you and your health needs are our priority.  As part of our continuing mission to provide you with exceptional heart care, we have created designated Provider Care Teams.  These Care Teams include your primary Cardiologist (physician) and Advanced Practice Providers (APPs -  Physician Assistants and Nurse Practitioners) who all work together to provide you with the care you need, when you need it. You will need a follow up appointment in 3 months.  Please call our office 2 months in advance to schedule this appointment.  You may see Kathlyn Sacramento, MD or one of the following Advanced Practice Providers on your designated Care Team:   Kerin Ransom, PA-C Roby Lofts, [place] . Sande Rives, PA-C  Any Other Special Instructions Will Be Listed Below (If Applicable).

## 2019-01-05 LAB — BASIC METABOLIC PANEL
BUN/Creatinine Ratio: 13 (ref 10–24)
BUN: 12 mg/dL (ref 8–27)
CO2: 24 mmol/L (ref 20–29)
Calcium: 9.5 mg/dL (ref 8.6–10.2)
Chloride: 98 mmol/L (ref 96–106)
Creatinine, Ser: 0.94 mg/dL (ref 0.76–1.27)
GFR calc Af Amer: 91 mL/min/{1.73_m2} (ref >59)
GFR calc non Af Amer: 79 mL/min/{1.73_m2} (ref >59)
Glucose: 80 mg/dL (ref 65–99)
Potassium: 4.1 mmol/L (ref 3.5–5.2)
Sodium: 140 mmol/L (ref 134–144)

## 2019-01-05 NOTE — Progress Notes (Signed)
The patient has been notified of the result and verbalized understanding.  All questions (if any) were answered. Jacqulynn Cadet, [place] 01/05/2019 10:34 AM

## 2019-02-13 ENCOUNTER — Ambulatory Visit (INDEPENDENT_AMBULATORY_CARE_PROVIDER_SITE_OTHER): Payer: Medicare Other | Admitting: Internal Medicine

## 2019-02-13 ENCOUNTER — Other Ambulatory Visit: Payer: Self-pay

## 2019-02-13 ENCOUNTER — Encounter: Payer: Self-pay | Admitting: Internal Medicine

## 2019-02-13 DIAGNOSIS — J449 Chronic obstructive pulmonary disease, unspecified: Secondary | ICD-10-CM

## 2019-02-13 DIAGNOSIS — Z23 Encounter for immunization: Secondary | ICD-10-CM

## 2019-02-13 DIAGNOSIS — J9611 Chronic respiratory failure with hypoxia: Secondary | ICD-10-CM | POA: Diagnosis not present

## 2019-02-13 NOTE — Patient Instructions (Addendum)
Continue symbicort 160  Take 2 puffs first thing in am and then another 2 puffs about 12 hours later.   Be sure to wear 02 2.5-3lpm when walking for exercise to keep the saturations consistently above 90% - this will help your heart and circulation and help you burn fat   Please schedule a follow up visit in 3 months but call sooner if needed  Add ? Add breztri next ov ? If insurance will cover

## 2019-02-13 NOTE — Assessment & Plan Note (Addendum)
Quit smoking 02/2018  - MM phenotype  - 02/09/2013  FEV1 2.14 (68%) ratio 60 p 30% better from B2 and dloc 52 corrects to 69 - 02/07/2015  extensive coaching HFA effectiveness =    90% from a baseline of 75%  - 02/07/2015  Walked RA x 3 laps @ 185 ft each stopped due to  End of study, nl pace, no sob or desat   - PFT's  03/21/2015  FEV1 1.92 (62 % ) ratio 60  p 14 % improvement from saba with DLCO  47 % corrects to 62 % for alv volume   - trial of spiriva respimat 03/21/2015  With goal to be able to walk up inclines > no better 06/23/2015 > try bevespi 2 bid x 3 weeks  -sob   Worse p 3 weeks bevespi  so resumed symb/spirva 06/25/2015  - referred to rehab 09/22/2015 >>> d/c'd p 19 sessions on 01/22/16 due to costs - 03/31/2016 rec pred x 6 days if needed  - 12/30/2016    restart spiriva 2.5 x 2 each am > could not afford it  - 04/01/2017    try bevespi  - 07/04/2017    continue beveslop and pred as Plan D but misunderstood and took daily  - 01/03/2018 daliresp trial in effort to reduced pred dependency > could not afford  - quit smoking again 02/2018  - 05/23/2018  After extensive coaching inhaler device,  effectiveness =    90% from a baseline of 75% > try symb 160 again as seems much better while on prednisone and if worse then add back spiriva  - Alpha one AT screen 05/23/2018   MM level 125  - PFT's  07/05/2018  FEV1 1.29 (43 % ) ratio 0.49  p 36 % improvement from saba p symb 160 x 2  prior to study with DLCO  48 % corrects to 67  % for alv volume   - 07/05/2018    try spiriva 2.5 x 2 puffs each am > no better on it 11/14/2018 so rec trial off  - 11/14/2018  After extensive coaching inhaler device,  effectiveness =    90% so continue symbicort 160 2bid     Denies any worse off spiriva in term so doe and having no tendency to increase in aecopd so ok to continue just symbiocrt 160 2bid and short courses of prednisone for any flares on this. Good candidate for Breztri trial when available.

## 2019-02-13 NOTE — Progress Notes (Signed)
Subjective:   Patient ID: Joel Perez, male    DOB: June 05, 1945   MRN: [id number]    Brief patient profile:  73 yowm MM/ quit smoking 02/2018  referred by Dr Moreen Fowler for eval of copd with GOLD II criteria established 02/09/13    History of Present Illness  12/27/2012 1st pulmonary eval/  cc onset x 2009 am cough cough/ congestion and progresive doe with white mucus x sev tbsp x sev min better p proaire and maintained on spiriva which he feels doesn't work as well as advair. Doe x one flight steps, dragging the garbage to street stops x 2. He denies cough to me but actually has a rattling with cough maneuver but doesn't bring up much mucus, what he does bring up is mostly in ams and mucoid and some better since quit smoking  rec symbicort 160 Take 2 puffs first thing in am and then another 2 puffs about 12 hours later.  Only use your albuterol (proaire) as a rescue medication to be used if you can't catch your breath by resting or doing a relaxed purse lip breathing pattern. The less you use it, the better it will work when you need it.  As you improve ok to stop spiriva  Please schedule a follow up office visit in 6 weeks, call sooner if needed with pft's    09/22/2015  f/u ov/ re: GOLD III copd/ maint rx = symbicort/ spiriva resp We will be referring you to rehab at [facility]        10/03/2017  f/u ov/ re:  GOLD III still smoking  / having trouble affording meds Chief Complaint  Patient presents with  . Follow-up    Increased SOB over the past 2 wks. He is using his proair 4-5 x per day and neb once daily on average.  He states he gets SOB walking just short distances. He did try round of pred and this helped some. He has been coughing up some clear sputum.    Dyspnea:  Still doe = MMRC3 = can't walk 100 yards even at a slow pace at a flat grade s stopping due to sob   Cough: clear / thick/ rattling worst in am  Sleep: R side / 2 pillows /no 02  SABA use:  Overusing unless  on prednisone rec Plan A = Automatic =  BEVESP  Take 2 puffs first thing in am and then another 2 puffs about 12 hours later.  Work on maintaining perfect  inhaler technique:   Plan B = Backup Only use your albuterol as a rescue medication Plan C = Crisis - only use your albuterol nebulizer if you first try Plan B and it fails to help > ok to use the nebulizer up to every 4 hours but if start needing it regularly call for immediate appointment Plan D = Deltasone, take x 2 daily until better then 1 daily x 5 days until better and stop  The key is to stop smoking completely before smoking completely stops you!  Please schedule a follow up visit in 3 months but call sooner if needed  with all medications /inhalers/ solutions in hand so we can verify exactly what you are taking. This includes all medications from all doctors and over the counters     07/05/2018  f/u ov/ re:  GOLD III / maint on symb 160 2bid  Chief Complaint  Patient presents with  . Follow-up    PFT done today. Breathing  has improved some. He uses his albuterol inhaler about once per day. He has not needed neb.   Dyspnea:  Treadmill at 1.5 mph at zero incline x 10 min stopped by back   / some [employer] ok MMRC3 = can't walk 100 yards even at a slow pace at a flat grade s stopping due to sob   Cough: none Sleeping: bed is flat/ 2 big pillows  SABA use: once a day  If has to go out of house for any reason  02: has but not using  rec Plan A = Automatic = symbicort 160 Take 2 puffs first thing in am and then another 2 puffs about 12 hours later.  And add spiriva 2 pffs each am  Work on inhaler technique:  Plan B = Backup Only use your albuterol inhaler as a rescue medication Plan C = Crisis - only use your albuterol nebulizer if you first try Plan B   Place your 0xygen on  when you walk to maintain the saturation above 90% or walk slower      11/14/2018  f/u ov/ re:  Copd III no better on 20 mg prednisone daily  finished it and this time says no worse off and doesn't think spiriva helping either  Chief Complaint  Patient presents with  . Follow-up    Breathing is about the same. He is using his proair 2-3 x per day and neb 2 x per wk.  Dyspnea:  MMRC3 = can't walk 100 yards even at a slow pace at a flat grade s stopping due to sob   Cough: minimal  Sleeping: on bed / flat/ 2 pillows  SABA use: as above 02: has poc doesn't think it works  rec Try off the spiriva (think of it like high octane fuel) Goal for walking is to keep your 02 sats above 90% when walking   02/13/2019  f/u ov/ re: COPD GOLD III / 02 dep with activity  / doe not worse off spiriva/ has pred for prn use  Chief Complaint  Patient presents with  . Follow-up  Dyspnea:  Doing gxt x 10 min 1.0 mph flat stops due to hip not sob/ sats ? In 80s but not using 02 correctly   Cough: better  Sleeping: on R side/ 2 pillows  SABA use:  02: not using at hs / using 3lpm cooking / not consistently using while walking    No obvious day to day or daytime variability or assoc excess/ purulent sputum or mucus plugs or hemoptysis or cp or chest tightness, subjective wheeze or overt sinus or hb symptoms.   Sleeping as above  without nocturnal  or early am exacerbation  of respiratory  c/o's or need for noct saba. Also denies any obvious fluctuation of symptoms with weather or environmental changes or other aggravating or alleviating factors except as outlined above   No unusual exposure hx or h/o childhood pna/ asthma or knowledge of premature birth.  Current Allergies, Complete Past Medical History, Past Surgical History, Family History, and Social History were reviewed in [employer] record.  ROS  The following are not active complaints unless bolded Hoarseness, sore throat, dysphagia, dental problems, itching, sneezing,  nasal congestion or discharge of excess mucus or purulent secretions, ear ache,   fever, chills,  sweats, unintended wt loss or wt gain, classically pleuritic or exertional cp,  orthopnea pnd or arm/hand swelling  or leg swelling, presyncope, palpitations, abdominal pain, anorexia, nausea, vomiting,  diarrhea  or change in bowel habits or change in bladder habits, change in stools or change in urine, dysuria, hematuria,  rash, arthralgias, visual complaints, headache, numbness, weakness or ataxia or problems with walking or coordination,  change in mood or  memory.        Current Meds  Medication Sig  . albuterol (PROVENTIL) (2.5 MG/3ML) 0.083% nebulizer solution USE ONE VIAL IN NEBULIZER EVERY 6 HOURS AS NEEDED FOR WHEEZING OR SHORTNESS OF BREATH  . aspirin 81 MG tablet Take 81 mg by mouth daily.  Marland Kitchen atorvastatin (LIPITOR) 40 MG tablet Take 1 tablet by mouth daily  . b complex vitamins tablet Take 1 tablet by mouth daily.  . budesonide-formoterol (SYMBICORT) 160-4.5 MCG/ACT inhaler Take 2 puffs first thing in am and then another 2 puffs about 12 hours later.  . cyclobenzaprine (FLEXERIL) 10 MG tablet Take 10 mg by mouth 3 (three) times daily as needed.  . furosemide (LASIX) 20 MG tablet Take 1 tablet (20 mg total) by mouth daily.  Marland Kitchen gabapentin (NEURONTIN) 300 MG capsule TAKE 1 CAPSULE BY MOUTH AT BEDTIME FOR  1  WEEK  THEN  INCREASE  TO  1  CAPSULE  TWICE  DAILY  . losartan (COZAAR) 25 MG tablet Take 25 mg by mouth daily.  . Multiple Vitamin (MULTIVITAMIN) capsule Take 1 capsule by mouth daily.  Marland Kitchen oxyCODONE-acetaminophen (PERCOCET/ROXICET) 5-325 MG per tablet Take 1 tablet by mouth every 6 (six) hours as needed.  . potassium chloride SA (KLOR-CON M20) 20 MEQ tablet Take 1 tablet (20 mEq total) by mouth daily.  Marland Kitchen PROAIR HFA 108 (90 Base) MCG/ACT inhaler INHALE 2 PUFFS INTO LUNGS EVERY 6 HOURS AS NEEDED FOR WHEEZING AND FOR SHORTNESS OF BREATH  . Sildenafil Citrate (VIAGRA PO) Take 1 tablet by mouth as needed.  . sodium chloride (OCEAN) 0.65 % nasal spray Place 1 spray into the nose as needed for  congestion.  .               Objective:   Physical Exam    02/13/2019  227  01/18/2014       179 > 02/07/2015 190 > 03/21/2015   192 > 06/23/2015  196 >   12/30/2015 198 > 03/31/2016 198 >  07/02/2016  201 > 12/30/2016 201 >  04/01/2017   195 >  07/04/2017  201 > 10/03/2017  197 >   01/03/2018  206 > 04/05/2018    206> 05/23/2018  211 > 07/05/2018  217 > 11/14/2018  225     02/09/13 177 lb (80.287 kg)  12/27/12 173 lb 12.8 oz (78.835 kg)     amb wm nad  Vital signs reviewed - Note on arrival 02 sats  97% on  2.5 lpm cont       HEENT : pt wearing mask not removed for exam due to covid - 19 concerns.   NECK :  without JVD/Nodes/TM/ nl carotid upstrokes bilaterally   LUNGS: no acc muscle use,  Mild barrel  contour chest wall with bilateral exp rhonchi   without cough on insp or exp maneuvers  and mild  Hyperresonant  to  percussion bilaterally     CV:  RRR  no s3 or II/VI SEM s  increase in P2, and  Trace bilateral pendal  edema   ABD:  Quite obese/ soft and nontender with pos end  insp Hoover's  in the supine position. No bruits or organomegaly appreciated, bowel sounds nl  MS:   Nl  gait/  ext warm without deformities, calf tenderness, cyanosis or clubbing No obvious joint restrictions   SKIN: warm and dry without lesions    NEURO:  alert, approp, nl sensorium with  no motor or cerebellar deficits apparent.                     Assessment & Plan:

## 2019-02-13 NOTE — Assessment & Plan Note (Signed)
Started on 02 with amb 2019 05/23/2018   Walked RA x one lap = 210 ft - stopped due to  desats to 88% then walked second lap on 3lpm POC and sats 89% at end , slow pace  - 11/14/2018 Patient Saturations on Room Air at Rest = 96  %   while Ambulating = 88%   Then  2  Liters of oxygen while Ambulating = 94% - 02/13/2019   Walked RA x one lap =  approx 250 ft - stopped due to  desats to 88% placed on 2.5 lpm and then completed second lap s desats  Echo reviewed today with pt and shows no cor pulmonale so rx is adequate = just use 2.5 lpm with ambulation/ exertion more than just walking room to room at home   I had an extended discussion with the patient   reviewing all relevant studies completed to date and  lasting 15 to 20 minutes of a 25 minute visit  which included directly observing ambulatory 02 saturation study documented in a/p section of  today's  office note.  Each maintenance medication was reviewed in detail including most importantly the difference between maintenance and prns and under what circumstances the prns are to be triggered using an action plan format that is not reflected in the computer generated alphabetically organized AVS.     Please see AVS for specific instructions unique to this visit that I personally wrote and verbalized to the the pt in detail and then reviewed with pt  by my nurse highlighting any changes in therapy recommended at today's visit .

## 2019-03-17 ENCOUNTER — Other Ambulatory Visit: Payer: Self-pay | Admitting: Internal Medicine

## 2019-04-17 ENCOUNTER — Ambulatory Visit: Payer: Medicare Other | Admitting: Cardiovascular Disease

## 2019-04-17 ENCOUNTER — Other Ambulatory Visit: Payer: Self-pay

## 2019-04-17 ENCOUNTER — Encounter: Payer: Self-pay | Admitting: Cardiovascular Disease

## 2019-04-17 VITALS — BP 156/79 | HR 100 | Temp 97.0°F | Ht 70.5 in

## 2019-04-17 DIAGNOSIS — I739 Peripheral vascular disease, unspecified: Secondary | ICD-10-CM | POA: Diagnosis not present

## 2019-04-17 DIAGNOSIS — E785 Hyperlipidemia, unspecified: Secondary | ICD-10-CM

## 2019-04-17 DIAGNOSIS — I10 Essential (primary) hypertension: Secondary | ICD-10-CM | POA: Diagnosis not present

## 2019-04-17 DIAGNOSIS — Z720 Tobacco use: Secondary | ICD-10-CM | POA: Diagnosis not present

## 2019-04-17 NOTE — Progress Notes (Signed)
Cardiology Office Note   Date:  04/18/2019   ID:  Joel Perez, DOB Apr 29, 1946, MRN [id number]  PCP:  [name], MD  Cardiologist:  Dr. Fletcher Anon No chief complaint on file.     History of Present Illness: Joel Perez is a 73 y.o. male who is here today for follow-up visit regarding peripheral arterial disease. He has known history of chronic diastolic heart failure, tobacco use, aortic valve disease and COPD. Echocardiogram showed normal LV systolic function with moderate LVH, mild to moderate aortic regurgitation with no significant stenosis. He is known to have leg edema thought to be due to chronic venous insufficiency. Venous Doppler was unremarkable. He was seen last year for mild bilateral leg claudication with symptoms of neuropathy as well.   He underwent lower extremity arterial Doppler which showed normal ABI bilaterally. Duplex showed moderate bilateral iliac disease and moderate bilateral common femoral artery disease with mild SFA disease and three-vessel runoff bilaterally. He was treating medically for peripheral arterial disease.  He has COPD and currently is on hypoxia.  He has been doing reasonably well with no chest pain and minimal leg claudication.  He has significant exertional dyspnea.  He quit smoking in January.   Past Medical History:  Diagnosis Date  . 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 BPH (benign prostatic hyperplasia)   . Capsular cataract of left eye   . COPD (chronic obstructive pulmonary disease) (HCC)    Dr. [name]  . Current smoker    Long-term smoker. Not interested in quitting  . Diverticulosis    With intermittent diverticulitis  . Erectile dysfunction   . Essential hypertension    Several recorded blood pressures greater than 140/90.  . H/O mitral valve prolapse    By report, but not confirmed by echo.  . Low back pain    Chronic. Followed by Dr. Suella Broad  . Mild aortic regurgitation 12/2013   Mild to moderate  aortic regurgitation on echo  . Neuropathy     Past Surgical History:  Procedure Laterality Date  . COLON SURGERY  2000  . ELBOW SURGERY    . TRANSTHORACIC ECHOCARDIOGRAM  12/29/2016   EF 60-65%. Moderate LVH. Mild aortic valve calcification. Mild to moderate regurgitation with no stenosis. Mitral valve annular calcification with no comment of prolapse or regurgitation.     Current Outpatient Medications  Medication Sig Dispense Refill  . albuterol (PROVENTIL) (2.5 MG/3ML) 0.083% nebulizer solution USE ONE VIAL IN NEBULIZER EVERY 6 HOURS AS NEEDED FOR WHEEZING OR SHORTNESS OF BREATH 75 vial 11  . aspirin 81 MG tablet Take 81 mg by mouth daily.    Marland Kitchen atorvastatin (LIPITOR) 40 MG tablet Take 1 tablet by mouth daily 90 tablet 2  . b complex vitamins tablet Take 1 tablet by mouth daily.    . budesonide-formoterol (SYMBICORT) 160-4.5 MCG/ACT inhaler Take 2 puffs first thing in am and then another 2 puffs about 12 hours later. 1 Inhaler 12  . cyclobenzaprine (FLEXERIL) 10 MG tablet Take 10 mg by mouth 3 (three) times daily as needed.    . furosemide (LASIX) 20 MG tablet Take 1 tablet (20 mg total) by mouth daily. 30 tablet 6  . gabapentin (NEURONTIN) 300 MG capsule TAKE 1 CAPSULE BY MOUTH AT BEDTIME FOR  1  WEEK  THEN  INCREASE  TO  1  CAPSULE  TWICE  DAILY 180 capsule 0  . losartan (COZAAR) 25 MG tablet Take 25  mg by mouth daily.    . Multiple Vitamin (MULTIVITAMIN) capsule Take 1 capsule by mouth daily.    Marland Kitchen oxyCODONE-acetaminophen (PERCOCET/ROXICET) 5-325 MG per tablet Take 1 tablet by mouth every 6 (six) hours as needed.    . potassium chloride SA (KLOR-CON M20) 20 MEQ tablet Take 1 tablet (20 mEq total) by mouth daily. 10 tablet 0  . predniSONE (DELTASONE) 10 MG tablet Take 2 tablets by mouth once daily 100 tablet 1  . PROAIR HFA 108 (90 Base) MCG/ACT inhaler INHALE 2 PUFFS BY MOUTH EVERY 6 HOURS AS NEEDED FOR WHEEZING AND FOR SHORTNESS OF BREATH 18 g 2  . Sildenafil Citrate (VIAGRA PO)  Take 1 tablet by mouth as needed.    . sodium chloride (OCEAN) 0.65 % nasal spray Place 1 spray into the nose as needed for congestion.     No current facility-administered medications for this visit.     Allergies:   Codeine and Sulfa antibiotics    Social History:  The patient  reports that he quit smoking about 13 months ago. His smoking use included cigarettes. He has a 100.00 pack-year smoking history. He has never used smokeless tobacco. He reports current alcohol use. He reports that he does not use drugs.   Family History:  The patient's family history includes Brain cancer in his brother; Breast cancer in his mother; Heart disease in his father; Other in his sister.    ROS:  Please see the history of present illness.   Otherwise, review of systems are positive for none.   All other systems are reviewed and negative.    PHYSICAL EXAM: VS:  BP (!) 156/79   Pulse 100   Temp (!) 97 F (36.1 C)   Ht 5' 10.5" (1.791 m)   SpO2 93%   BMI 32.11 kg/m  , BMI Body mass index is 32.11 kg/m. GEN: Well nourished, well developed, in no acute distress  HEENT: normal  Neck: no JVD, carotid bruits, or masses Cardiac: RRR; no  rubs, or gallops. There is one out of 6 systolic ejection murmur in the aortic area. There is mild to moderate bilateral leg edema Respiratory:  clear to auscultation bilaterally, normal work of breathing GI: soft, nontender, nondistended, + BS MS: no deformity or atrophy  Skin: warm and dry, no rash Neuro:  Strength and sensation are intact Psych: euthymic mood, full affect Vascular: Femoral pulses are normal bilaterally.  Posterior tibial pulses normal bilaterally.  EKG:  EKG is ordered today. EKG showed normal sinus rhythm with PACs.  Poor R wave progression in the anterior leads.   Recent Labs: 11/14/2018: ALT 39; Hemoglobin 15.7; Platelets 233.0; Pro B Natriuretic peptide (BNP) 32.0; TSH 2.00 01/04/2019: BUN 12; Creatinine, Ser 0.94; Potassium 4.1; Sodium  140    Lipid Panel    Component Value Date/Time   CHOL 154 04/08/2017 1504   TRIG 114 04/08/2017 1504   HDL 48 04/08/2017 1504   CHOLHDL 3.2 04/08/2017 1504   LDLCALC 83 04/08/2017 1504      Wt Readings from Last 3 Encounters:  02/13/19 227 lb (103 kg)  01/04/19 223 lb 9.6 oz (101.4 kg)  12/19/18 229 lb 9.6 oz (104.1 kg)       PAD Screen 01/17/2017  Previous surgical procedure? Yes  Dates of procedures colon ressection  Pain with walking? Yes  Subsides with rest? Yes  Painful, non-healing ulcers? Yes  Extremities discolored? Yes      ASSESSMENT AND PLAN:  1.  Peripheral  arterial disease:The patient has mild to moderate diffuse peripheral arterial disease with no evidence of obstructive stenosis.  He has minimal claudication. Continue medical therapy.   2. Tobacco use: he quit smoking in January.   3. Hyperlipidemia: Continue treatment with atorvastatin.  Most recent LDL was 83.  4.  Essential hypertension: Blood pressure is elevated but he did not take Losartan. BP is usually ok.   5.  Mild to moderate aortic regurgitation: this was stable on recent echo.    Disposition:   FU with me in 6 months  Signed,  Kathlyn Sacramento, MD  04/18/2019 3:04 PM    [facility]

## 2019-04-17 NOTE — Patient Instructions (Signed)
Medication Instructions:  No changes *If you need a refill on your cardiac medications before your next appointment, please call your pharmacy*  Lab Work: None ordered If you have labs (blood work) drawn today and your tests are completely normal, you will receive your results only by: . MyChart Message (if you have MyChart) OR . A paper copy in the mail If you have any lab test that is abnormal or we need to change your treatment, we will call you to review the results.  Testing/Procedures: None ordered  Follow-Up: At [facility], you and your health needs are our priority.  As part of our continuing mission to provide you with exceptional heart care, we have created designated Provider Care Teams.  These Care Teams include your primary Cardiologist (physician) and Advanced Practice Providers (APPs -  Physician Assistants and Nurse Practitioners) who all work together to provide you with the care you need, when you need it.  Your next appointment:   6 month(s)  The format for your next appointment:   In Person  Provider:   Muhammad Arida, MD    

## 2019-04-18 ENCOUNTER — Other Ambulatory Visit: Payer: Self-pay

## 2019-04-18 DIAGNOSIS — Z20822 Contact with and (suspected) exposure to covid-19: Secondary | ICD-10-CM

## 2019-04-20 LAB — NOVEL CORONAVIRUS, NAA: SARS-CoV-2, NAA: NOT DETECTED

## 2019-04-26 ENCOUNTER — Telehealth: Payer: Self-pay | Admitting: *Deleted

## 2019-04-26 NOTE — Telephone Encounter (Signed)
Reviewed negative Covid19 with patient and his wife. No further questions asked.

## 2019-04-27 ENCOUNTER — Other Ambulatory Visit: Payer: Self-pay | Admitting: Cardiovascular Disease

## 2019-04-27 DIAGNOSIS — I739 Peripheral vascular disease, unspecified: Secondary | ICD-10-CM

## 2019-05-07 ENCOUNTER — Other Ambulatory Visit: Payer: Self-pay | Admitting: Internal Medicine

## 2019-05-15 ENCOUNTER — Ambulatory Visit: Payer: Medicare Other | Admitting: Internal Medicine

## 2019-05-21 ENCOUNTER — Ambulatory Visit: Payer: Medicare Other | Admitting: Internal Medicine

## 2019-05-21 ENCOUNTER — Encounter: Payer: Self-pay | Admitting: Internal Medicine

## 2019-05-21 ENCOUNTER — Other Ambulatory Visit: Payer: Self-pay

## 2019-05-21 DIAGNOSIS — J449 Chronic obstructive pulmonary disease, unspecified: Secondary | ICD-10-CM

## 2019-05-21 DIAGNOSIS — J9611 Chronic respiratory failure with hypoxia: Secondary | ICD-10-CM | POA: Diagnosis not present

## 2019-05-21 MED ORDER — BREZTRI AEROSPHERE 160-9-4.8 MCG/ACT IN AERO: 2.0000 | INHALATION_SPRAY | Freq: Two times a day (BID) | RESPIRATORY_TRACT | 11 refills | Status: DC

## 2019-05-21 MED ORDER — BREZTRI AEROSPHERE 160-9-4.8 MCG/ACT IN AERO: 2.0000 | INHALATION_SPRAY | Freq: Two times a day (BID) | RESPIRATORY_TRACT | 0 refills | Status: DC

## 2019-05-21 NOTE — Progress Notes (Signed)
Subjective:   Patient ID: Joel Perez, male    DOB: June 05, 1945   MRN: [id number]    Brief patient profile:  73 yowm MM/ quit smoking 02/2018  referred by Dr Moreen Fowler for eval of copd with GOLD II criteria established 02/09/13    History of Present Illness  12/27/2012 1st pulmonary eval/ Joel Perez cc onset x 2009 am cough cough/ congestion and progresive doe with white mucus x sev tbsp x sev min better p proaire and maintained on spiriva which he feels doesn't work as well as advair. Doe x one flight steps, dragging the garbage to street stops x 2. He denies cough to me but actually has a rattling with cough maneuver but doesn't bring up much mucus, what he does bring up is mostly in ams and mucoid and some better since quit smoking  rec symbicort 160 Take 2 puffs first thing in am and then another 2 puffs about 12 hours later.  Only use your albuterol (proaire) as a rescue medication to be used if you can't catch your breath by resting or doing a relaxed purse lip breathing pattern. The less you use it, the better it will work when you need it.  As you improve ok to stop spiriva  Please schedule a follow up office visit in 6 weeks, call sooner if needed with pft's    09/22/2015  f/u ov/Joel Perez re: GOLD III copd/ maint rx = symbicort/ spiriva resp We will be referring you to rehab at [facility]        10/03/2017  f/u ov/Joel Perez re:  GOLD III still smoking  / having trouble affording meds Chief Complaint  Patient presents with  . Follow-up    Increased SOB over the past 2 wks. He is using his proair 4-5 x per day and neb once daily on average.  He states he gets SOB walking just short distances. He did try round of pred and this helped some. He has been coughing up some clear sputum.    Dyspnea:  Still doe = MMRC3 = can't walk 100 yards even at a slow pace at a flat grade s stopping due to sob   Cough: clear / thick/ rattling worst in am  Sleep: R side / 2 pillows /no 02  SABA use:  Overusing unless  on prednisone rec Plan A = Automatic =  BEVESP  Take 2 puffs first thing in am and then another 2 puffs about 12 hours later.  Work on maintaining perfect  inhaler technique:   Plan B = Backup Only use your albuterol as a rescue medication Plan C = Crisis - only use your albuterol nebulizer if you first try Plan B and it fails to help > ok to use the nebulizer up to every 4 hours but if start needing it regularly call for immediate appointment Plan D = Deltasone, take x 2 daily until better then 1 daily x 5 days until better and stop  The key is to stop smoking completely before smoking completely stops you!  Please schedule a follow up visit in 3 months but call sooner if needed  with all medications /inhalers/ solutions in hand so we can verify exactly what you are taking. This includes all medications from all doctors and over the counters     07/05/2018  f/u ov/Joel Perez re:  GOLD III / maint on symb 160 2bid  Chief Complaint  Patient presents with  . Follow-up    PFT done today. Breathing  has improved some. He uses his albuterol inhaler about once per day. He has not needed neb.   Dyspnea:  Treadmill at 1.5 mph at zero incline x 10 min stopped by back   / some [employer] ok MMRC3 = can't walk 100 yards even at a slow pace at a flat grade s stopping due to sob   Cough: none Sleeping: bed is flat/ 2 big pillows  SABA use: once a day  If has to go out of house for any reason  02: has but not using  rec Plan A = Automatic = symbicort 160 Take 2 puffs first thing in am and then another 2 puffs about 12 hours later.  And add spiriva 2 pffs each am  Work on inhaler technique:  Plan B = Backup Only use your albuterol inhaler as a rescue medication Plan C = Crisis - only use your albuterol nebulizer if you first try Plan B   Place your 0xygen on  when you walk to maintain the saturation above 90% or walk slower      11/14/2018  f/u ov/Joel Perez re:  Copd III no better on 20 mg prednisone daily  finished it and this time says no worse off and doesn't think spiriva helping either  Chief Complaint  Patient presents with  . Follow-up    Breathing is about the same. He is using his proair 2-3 x per day and neb 2 x per wk.  Dyspnea:  MMRC3 = can't walk 100 yards even at a slow pace at a flat grade s stopping due to sob   Cough: minimal  Sleeping: on bed / flat/ 2 pillows  SABA use: as above 02: has poc doesn't think it works  rec Try off the spiriva (think of it like high octane fuel) Goal for walking is to keep your 02 sats above 90% when walking    02/13/2019  f/u ov/Joel Perez re: COPD GOLD III / 02 dep with activity  / doe not worse off spiriva/ has pred for prn use  Chief Complaint  Patient presents with  . Follow-up  Dyspnea:  Doing gxt x 10 min 1.0 mph flat stops due to hip not sob/ sats ? In 80s but not using 02 correctly   Cough: better  Sleeping: on R side/ 2 pillows  SABA use:  02: not using at hs / using 3lpm cooking / not consistently using while walking  rec Continue symbicort 160  Take 2 puffs first thing in am and then another 2 puffs about 12 hours later.  Be sure to wear 02 2.5-3lpm when walking for exercise to keep the saturations consistently above 90% - this will help your heart and circulation and help you burn fat Please schedule a follow up visit in 3 months but call sooner if needed  Add ? Add breztri next ov ? If insurance will cover   05/21/2019  f/u ov/Joel Perez re:  Copd  III  No longer on spiriva / pred is plan D Chief Complaint  Patient presents with  . Follow-up    SOB on extertion. Patient denies any coughing.  Dyspnea:  Doing gxt x 15 min s stopping @ grade = flat at 1.1 mph on 3lpm not checking sats  Cough: no cough  Sleeping: on side / 2 pillows  SABA use: neb before activity 02: 3lpm with activity not checking levels / no using at hs / not using it at rest   No obvious day to day  or daytime variability or assoc excess/ purulent sputum or mucus  plugs or hemoptysis or cp or chest tightness, subjective wheeze or overt sinus or hb symptoms.   Sleeping as above without nocturnal  or early am exacerbation  of respiratory  c/o's or need for noct saba. Also denies any obvious fluctuation of symptoms with weather or environmental changes or other aggravating or alleviating factors except as outlined above   No unusual exposure hx or h/o childhood pna/ asthma or knowledge of premature birth.  Current Allergies, Complete Past Medical History, Past Surgical History, Family History, and Social History were reviewed in [employer] record.  ROS  The following are not active complaints unless bolded Hoarseness, sore throat, dysphagia, dental problems, itching, sneezing,  nasal congestion or discharge of excess mucus or purulent secretions, ear ache,   fever, chills, sweats, unintended wt loss or wt gain, classically pleuritic or exertional cp,  orthopnea pnd or arm/hand swelling  or leg swelling feet only, presyncope, palpitations, abdominal pain, anorexia, nausea, vomiting, diarrhea  or change in bowel habits or change in bladder habits, change in stools or change in urine, dysuria, hematuria,  rash, arthralgias, visual complaints, headache, numbness, weakness or ataxia or problems with walking or coordination,  change in mood or  memory.        Current Meds  Medication Sig  . albuterol (PROVENTIL) (2.5 MG/3ML) 0.083% nebulizer solution USE 1 VIAL IN NEBULIZER EVERY 6 HOURS AS NEEDED FOR WHEEZING OR  SHORTNESS  OF  BREATH  . aspirin 81 MG tablet Take 81 mg by mouth daily.  Marland Kitchen atorvastatin (LIPITOR) 40 MG tablet Take 1 tablet by mouth daily  . b complex vitamins tablet Take 1 tablet by mouth daily.  . budesonide-formoterol (SYMBICORT) 160-4.5 MCG/ACT inhaler Take 2 puffs first thing in am and then another 2 puffs about 12 hours later.  . cyclobenzaprine (FLEXERIL) 10 MG tablet Take 10 mg by mouth 3 (three) times daily as needed.   . furosemide (LASIX) 20 MG tablet Take 1 tablet (20 mg total) by mouth daily.  Marland Kitchen gabapentin (NEURONTIN) 300 MG capsule TAKE 1 CAPSULE BY MOUTH AT BEDTIME FOR  1  WEEK  THEN  INCREASE  TO  1  CAPSULE  TWICE  DAILY  . losartan (COZAAR) 25 MG tablet Take 25 mg by mouth daily.  . Multiple Vitamin (MULTIVITAMIN) capsule Take 1 capsule by mouth daily.  Marland Kitchen oxyCODONE-acetaminophen (PERCOCET/ROXICET) 5-325 MG per tablet Take 1 tablet by mouth every 6 (six) hours as needed.  . potassium chloride SA (KLOR-CON M20) 20 MEQ tablet Take 1 tablet (20 mEq total) by mouth daily.  . predniSONE (DELTASONE) 10 MG tablet Take 2 tablets by mouth once daily  . PROAIR HFA 108 (90 Base) MCG/ACT inhaler INHALE 2 PUFFS BY MOUTH EVERY 6 HOURS AS NEEDED FOR WHEEZING AND FOR SHORTNESS OF BREATH  . Sildenafil Citrate (VIAGRA PO) Take 1 tablet by mouth as needed.  . sodium chloride (OCEAN) 0.65 % nasal spray Place 1 spray into the nose as needed for congestion.               Objective:   Physical Exam    05/21/2019   234  02/13/2019  227  01/18/2014       179 > 02/07/2015 190 > 03/21/2015   192 > 06/23/2015  196 >   12/30/2015 198 > 03/31/2016 198 >  07/02/2016  201 > 12/30/2016 201 >  04/01/2017   195 >  07/04/2017  201 > 10/03/2017  197 >   01/03/2018  206 > 04/05/2018    206> 05/23/2018  211 > 07/05/2018  217 > 11/14/2018  225     02/09/13 177 lb (80.287 kg)  12/27/12 173 lb 12.8 oz (78.835 kg)     Vital signs reviewed - Note on arrival 02 sats  95% on RA         HEENT : pt wearing mask not removed for exam due to covid - 19 concerns.    NECK :  without JVD/Nodes/TM/ nl carotid upstrokes bilaterally   LUNGS: no acc muscle use,  Mild barrel  contour chest wall with bilateral  Distant bs s audible wheeze and  without cough on insp or exp maneuvers  and mild  Hyperresonant  to  percussion bilaterally     CV:  RRR  no s3 or murmur or increase in P2, and no edema   ABD:  soft and nontender with pos end  insp Hoover's  in  the supine position. No bruits or organomegaly appreciated, bowel sounds nl  MS:   Nl gait/  ext warm without deformities, calf tenderness, cyanosis or clubbing No obvious joint restrictions   SKIN: warm and dry without lesions    NEURO:  alert, approp, nl sensorium with  no motor or cerebellar deficits apparent.          Assessment & Plan:

## 2019-05-21 NOTE — Patient Instructions (Addendum)
Plan A = Automatic = Always=    Breztri  Take 2 puffs first thing in am and then another 2 puffs about 12 hours later.     Plan B = Backup (to supplement plan A, not to replace it) Only use your albuterol inhaler as a rescue medication to be used if you can't catch your breath by resting or doing a relaxed purse lip breathing pattern.  - The less you use it, the better it will work when you need it. - Ok to use the inhaler up to 2 puffs  every 4 hours if you must but call for appointment if use goes up over your usual need - Don't leave home without it !!  (think of it like the spare tire for your car)   Plan C = Crisis (instead of Plan B but only if Plan B stops working) - only use your albuterol nebulizer if you first try Plan B and it fails to help > ok to use the nebulizer up to every 4 hours but if start needing it regularly call for immediate appointment  Plan D = Prednisone 10 mg  Take 2 daily until better then 1 daily x 5 days and stop.   Make sure you check your oxygen saturations at highest level of activity to be sure it stays over 90% and adjust upward to maintain this level if needed but remember to turn it back to previous settings when you stop (to conserve your supply).    Please schedule a follow up visit in 6 months but call sooner if needed

## 2019-05-22 ENCOUNTER — Telehealth: Payer: Self-pay | Admitting: *Deleted

## 2019-05-22 ENCOUNTER — Encounter: Payer: Self-pay | Admitting: Internal Medicine

## 2019-05-22 MED ORDER — PREDNISONE 10 MG PO TABS: 20.0000 mg | ORAL_TABLET | Freq: Every day | ORAL | 1 refills | Status: DC

## 2019-05-22 NOTE — Assessment & Plan Note (Signed)
Quit smoking 02/2018  - MM phenotype  - 02/09/2013  FEV1 2.14 (68%) ratio 60 p 30% better from B2 and dloc 52 corrects to 69 - 02/07/2015  extensive coaching HFA effectiveness =    90% from a baseline of 75%  - 02/07/2015  Walked RA x 3 laps @ 185 ft each stopped due to  End of study, nl pace, no sob or desat   - PFT's  03/21/2015  FEV1 1.92 (62 % ) ratio 60  p 14 % improvement from saba with DLCO  47 % corrects to 62 % for alv volume   - trial of spiriva respimat 03/21/2015  With goal to be able to walk up inclines > no better 06/23/2015 > try bevespi 2 bid x 3 weeks  -sob   Worse p 3 weeks bevespi  so resumed symb/spirva 06/25/2015  - referred to rehab 09/22/2015 >>> d/c'd p 19 sessions on 01/22/16 due to costs - 03/31/2016 rec pred x 6 days if needed  - 12/30/2016    restart spiriva 2.5 x 2 each am > could not afford it  - 04/01/2017    try bevespi  - 07/04/2017    continue beveslop and pred as Plan D but misunderstood and took daily  - 01/03/2018 daliresp trial in effort to reduced pred dependency > could not afford  - quit smoking again 02/2018  - 05/23/2018  After extensive coaching inhaler device,  effectiveness =    90% from a baseline of 75% > try symb 160 again as seems much better while on prednisone and if worse then add back spiriva  - Alpha one AT screen 05/23/2018   MM level 125  - PFT's  07/05/2018  FEV1 1.29 (43 % ) ratio 0.49  p 36 % improvement from saba p symb 160 x 2  prior to study with DLCO  48 % corrects to 67  % for alv volume   - 07/05/2018    try spiriva 2.5 x 2 puffs each am > no better on it 11/14/2018 so rec trial off  - 11/14/2018   Continue symbicort 160 2bid only plus prednisone as plan D - 05/21/2019  After extensive coaching inhaler device,  effectiveness =    90% so try breztri    Group D in terms of symptom/risk and laba/lama/ICS  therefore appropriate rx at this point >>>  Try breztri if covered.  Advised:  formulary restrictions will be an ongoing challenge for the  forseable future and I would be happy to pick an alternative if the pt will first  provide me a list of them -  pt  will need to return here for training for any new device that is required eg dpi vs hfa vs respimat.    In the meantime we can always provide samples so that the patient never runs out of any needed respiratory medications.   The goal with a chronic steroid dependent illness is always arriving at the lowest effective dose that controls the disease/symptoms and not accepting a set "formula" which is based on statistics or guidelines that don't always take into account patient  variability or the natural hx of the dz in every individual patient, which may well vary over time.  For now therefore I recommend the patient [place]   pred 20 until better then 10 x 5 days and stop but as Plan D

## 2019-05-22 NOTE — Telephone Encounter (Signed)
-----   Message from Tanda Rockers, MD sent at 05/22/2019  5:19 AM EST ----- I forgot to Add plan D to his AVS:  Plan D = Prednisone 10 mg  Take 2 daily until better then 1 daily x 5 days and stop.  Make sure that's his understanding of how to use it and call for refills when runs out just like he would any other meds

## 2019-05-22 NOTE — Assessment & Plan Note (Addendum)
Started on 02 with amb 2019 05/23/2018   Walked RA x one lap = 210 ft - stopped due to  desats to 88% then walked second lap on 3lpm POC and sats 89% at end , slow pace  - 11/14/2018 Patient Saturations on Room Air at Rest = 96  %   while Ambulating = 88%   Then  2  Liters of oxygen while Ambulating = 94% - 02/13/2019   Walked RA x one lap =  approx 250 ft - stopped due to  desats to 88% placed on 2.5 lpm and then completed second lap s desats  No need for 02 at rest or hs but with ex:  advised Make sure you check your oxygen saturations at highest level of activity to be sure it stays over 90% and adjust upward to maintain this level if needed but remember to turn it back to previous settings when you stop (to conserve your supply).   Pt informed of the seriousness of COVID 19 infection as a direct risk to lung health  and safey and to close contacts and should continue to wear a facemask in public and minimize exposure to public locations but especially avoid any area or activity where non-close contacts are not observing distancing or wearing an appropriate face mask.  I strongly recommended vaccine when offered.    >>> f/u in 6 m to avoid unnecessary covid 19 exp   I had an extended discussion with the patient reviewing all relevant studies completed to date and  lasting 15 to 20 minutes of a 25 minute visit    I performed detailed device teaching using a teach back method which extended face to face time for this visit (see above)  Each maintenance medication was reviewed in detail including emphasizing most importantly the difference between maintenance and prns and under what circumstances the prns are to be triggered using an action plan format that is not reflected in the computer generated alphabetically organized AVS which I have not found useful in most complex patients, especially with respiratory illnesses  Please see AVS for specific instructions unique to this visit that I  personally wrote and verbalized to the the pt in detail and then reviewed with pt  by my nurse highlighting any  changes in therapy recommended at today's visit to their plan of care.

## 2019-05-22 NOTE — Telephone Encounter (Signed)
I called and spoke with the pt and notified of recs per MW  He verbalized understanding  He asked for refill on pred  Rx was sent

## 2019-05-29 ENCOUNTER — Telehealth: Payer: Self-pay

## 2019-05-29 NOTE — Telephone Encounter (Signed)
PA request received from: [facility] requested: Arnell Sieving CMM Key: [facility] PA request has been sent to plan, and a determination is expected within 3 days.   Routing to [place] for follow-up.

## 2019-06-07 NOTE — Telephone Encounter (Signed)
PA for Judithann Sauger has been approved until 05/23/2020. Pharmacy is aware of approval. Nothing further needed.

## 2019-06-15 ENCOUNTER — Ambulatory Visit: Payer: Medicare Other | Attending: Internal Medicine

## 2019-06-15 DIAGNOSIS — Z23 Encounter for immunization: Secondary | ICD-10-CM

## 2019-06-15 NOTE — Progress Notes (Signed)
   Covid-19 Vaccination Clinic  Name:  Joel Perez    MRN: [id number] DOB: Sep 23, 1945  06/15/2019  [name] was observed post Covid-19 immunization for 30 minutes based on pre-vaccination screening without incidence. He was provided with Vaccine Information Sheet and instruction to access the V-Safe system.   [name] was instructed to call 911 with any severe reactions post vaccine: Marland Kitchen Difficulty breathing  . Swelling of your face and throat  . A fast heartbeat  . A bad rash all over your body  . Dizziness and weakness    Immunizations Administered    Name Date Dose VIS Date Route   Pfizer COVID-19 Vaccine 06/15/2019 10:46 AM 0.3 mL 05/04/2019 Intramuscular   Manufacturer: [place]   Lot: [id number]   [place]: [id number]

## 2019-07-04 ENCOUNTER — Other Ambulatory Visit: Payer: Self-pay | Admitting: Internal Medicine

## 2019-07-06 ENCOUNTER — Ambulatory Visit: Payer: Medicare Other | Attending: Internal Medicine

## 2019-07-06 DIAGNOSIS — Z23 Encounter for immunization: Secondary | ICD-10-CM | POA: Insufficient documentation

## 2019-07-06 NOTE — Progress Notes (Signed)
   Covid-19 Vaccination Clinic  Name:  Joel Perez    MRN: [id number] DOB: [birth year]  07/06/2019  [name] was observed post Covid-19 immunization for 15 minutes without incidence. He was provided with Vaccine Information Sheet and instruction to access the V-Safe system.   [name] was instructed to call 911 with any severe reactions post vaccine: Marland Kitchen Difficulty breathing  . Swelling of your face and throat  . A fast heartbeat  . A bad rash all over your body  . Dizziness and weakness    Immunizations Administered    Name Date Dose VIS Date Route   Pfizer COVID-19 Vaccine 07/06/2019  1:42 PM 0.3 mL 05/04/2019 Intramuscular   Manufacturer: [place]   Lot: [id number]   [place]: [id number]

## 2019-08-06 ENCOUNTER — Other Ambulatory Visit: Payer: Self-pay | Admitting: Internal Medicine

## 2019-09-07 ENCOUNTER — Other Ambulatory Visit: Payer: Self-pay | Admitting: General Practice

## 2019-09-07 DIAGNOSIS — R600 Localized edema: Secondary | ICD-10-CM

## 2019-10-18 ENCOUNTER — Encounter (INDEPENDENT_AMBULATORY_CARE_PROVIDER_SITE_OTHER): Payer: Medicare Other | Admitting: Adult Health

## 2019-10-18 ENCOUNTER — Encounter: Payer: Self-pay | Admitting: Adult Health

## 2019-10-18 VITALS — BP 137/71 | HR 74 | Ht 70.5 in | Wt 230.0 lb

## 2019-10-18 DIAGNOSIS — J449 Chronic obstructive pulmonary disease, unspecified: Secondary | ICD-10-CM | POA: Diagnosis not present

## 2019-10-18 DIAGNOSIS — I739 Peripheral vascular disease, unspecified: Secondary | ICD-10-CM

## 2019-10-18 DIAGNOSIS — I10 Essential (primary) hypertension: Secondary | ICD-10-CM

## 2019-10-18 NOTE — Patient Instructions (Signed)
Medication Instructions:  Continue current medications  *If you need a refill on your cardiac medications before your next appointment, please call your pharmacy*   Lab Work: None Ordered  Testing/Procedures: None Ordered   Follow-Up: At [facility], you and your health needs are our priority.  As part of our continuing mission to provide you with exceptional heart care, we have created designated Provider Care Teams.  These Care Teams include your primary Cardiologist (physician) and Advanced Practice Providers (APPs -  Physician Assistants and Nurse Practitioners) who all work together to provide you with the care you need, when you need it.  We recommend signing up for the patient portal called "MyChart".  Sign up information is provided on this After Visit Summary.  MyChart is used to connect with patients for Virtual Visits (Telemedicine).  Patients are able to view lab/test results, encounter notes, upcoming appointments, etc.  Non-urgent messages can be sent to your provider as well.   To learn more about what you can do with MyChart, go to [email or web address].    Your next appointment:   3 month(s)  The format for your next appointment:   In Person  Provider:   You may see Kathlyn Sacramento, MD or one of the following Advanced Practice Providers on your designated Care Team:    Kerin Ransom, PA-C  [place], [place]  Coletta Memos, [place]

## 2019-10-18 NOTE — Progress Notes (Signed)
Duplicate note ERROr

## 2019-10-18 NOTE — Progress Notes (Signed)
Virtual Visit via Telephone Note   This visit type was conducted due to national recommendations for restrictions regarding the COVID-19 Pandemic (e.g. social distancing) in an effort to limit this patient's exposure and mitigate transmission in our community.  Due to his co-morbid illnesses, this patient is at least at moderate risk for complications without adequate follow up.  This format is felt to be most appropriate for this patient at this time.  The patient did not have access to video technology/had technical difficulties with video requiring transitioning to audio format only (telephone).  All issues noted in this document were discussed and addressed.  No physical exam could be performed with this format.  Please refer to the patient's chart for his  consent to telehealth for [facility].   Date:  10/18/2019   ID:  [name], DOB 09-26-1945, MRN [id number]  Patient Location: Home Provider Location: Home  PCP:  [name], MD  Cardiologist:  Kathlyn Sacramento, MD  Electrophysiologist:  None   Evaluation Performed:  Follow-Up Visit  Chief Complaint:  Follow Up  History of Present Illness:    Joel Perez is a 74 y.o. male ongoing assessment and management of PAD with known history of chronic diastolic heart failure, tobacco abuse, aortic valve disease, and COPD on O2 followed by Dr. [name].  He quit smoking in January 2010, per Dr. Tyrell Antonio note on 04/17/2019.  Most recent echocardiogram revealed normal LV systolic function with moderate LVH, mild to moderate aortic regurgitation with no significant stenosis.  He does have a history of chronic leg edema with chronic venous insufficiency venous Doppler studies were unremarkable.  He did have mild bilateral leg claudication symptoms of neuropathy as well.  He had a follow-up lower extremity arterial Doppler with ABIs bilaterally which was found to be normal concerning ABI measurements.  However duplex showed moderate bilateral  iliac disease and moderate bilateral common femoral artery disease with mild FSA disease and three-vessel runoff bilaterally.  He is being treated medically for this.  On last visit with Dr. Fletcher Anon, he was continued on statin therapy with goal of LDL less than 70 however LDL was most recently valued at 18 per labs.  Blood pressure was slightly elevated but he had not taken his losartan prior to that office visit.  He was to follow-up in 6 months unless symptomatic.  He has continued complaints of leg pain. Uses a cain for ambulation. No significant edema. States that the pain is not well controlled on gabapentin. Labs are drawn by PCP every 6 months.   The patient does note have symptoms concerning for COVID-19 infection (fever, chills, cough, or new shortness of breath).    Past Medical History:  Diagnosis Date  . 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 BPH (benign prostatic hyperplasia)   . Capsular cataract of left eye   . COPD (chronic obstructive pulmonary disease) (HCC)    Dr. [name]  . Current smoker    Long-term smoker. Not interested in quitting  . Diverticulosis    With intermittent diverticulitis  . Erectile dysfunction   . Essential hypertension    Several recorded blood pressures greater than 140/90.  . H/O mitral valve prolapse    By report, but not confirmed by echo.  . Low back pain    Chronic. Followed by Dr. Suella Broad  . Mild aortic regurgitation 12/2013   Mild to moderate aortic regurgitation on echo  . Neuropathy  Past Surgical History:  Procedure Laterality Date  . COLON SURGERY  2000  . ELBOW SURGERY    . TRANSTHORACIC ECHOCARDIOGRAM  12/29/2016   EF 60-65%. Moderate LVH. Mild aortic valve calcification. Mild to moderate regurgitation with no stenosis. Mitral valve annular calcification with no comment of prolapse or regurgitation.     Current Meds  Medication Sig  . albuterol (PROVENTIL) (2.5 MG/3ML) 0.083% nebulizer solution USE 1 VIAL  IN NEBULIZER EVERY 6 HOURS AS NEEDED FOR WHEEZING OR  SHORTNESS  OF  BREATH  . aspirin 81 MG tablet Take 81 mg by mouth daily.  Marland Kitchen atorvastatin (LIPITOR) 40 MG tablet Take 1 tablet by mouth daily  . b complex vitamins tablet Take 1 tablet by mouth daily.  . Budeson-Glycopyrrol-Formoterol (BREZTRI AEROSPHERE) 160-9-4.8 MCG/ACT AERO Inhale 2 puffs into the lungs 2 (two) times daily.  . cyclobenzaprine (FLEXERIL) 10 MG tablet Take 10 mg by mouth 3 (three) times daily as needed.  . furosemide (LASIX) 20 MG tablet Take 1 tablet by mouth daily  . gabapentin (NEURONTIN) 300 MG capsule TAKE 1 CAPSULE BY MOUTH AT BEDTIME FOR  1  WEEK  THEN  INCREASE  TO  1  CAPSULE  TWICE  DAILY  . losartan (COZAAR) 25 MG tablet Take 25 mg by mouth daily.  . Multiple Vitamin (MULTIVITAMIN) capsule Take 1 capsule by mouth daily.  Marland Kitchen oxyCODONE-acetaminophen (PERCOCET) 10-325 MG tablet Take 1 tablet by mouth 2 (two) times daily as needed.  . potassium chloride SA (KLOR-CON M20) 20 MEQ tablet Take 1 tablet (20 mEq total) by mouth daily.  . predniSONE (DELTASONE) 10 MG tablet Take 2 tablets (20 mg total) by mouth daily. As directed  . PROAIR HFA 108 (90 Base) MCG/ACT inhaler INHALE 2 PUFFS BY MOUTH EVERY 6 HOURS AS NEEDED FOR WHEEZING AND FOR SHORTNESS OF BREATH  . Sildenafil Citrate (VIAGRA PO) Take 1 tablet by mouth as needed.  . sodium chloride (OCEAN) 0.65 % nasal spray Place 1 spray into the nose as needed for congestion.  . SYMBICORT 160-4.5 MCG/ACT inhaler TAKE 2 PUFFS FIRST THING IN THE MORNING AND THEN TAKE ANOTHER 2 PUFFS 12 HOURS LATER  . [DISCONTINUED] oxyCODONE-acetaminophen (PERCOCET/ROXICET) 5-325 MG per tablet Take 1 tablet by mouth every 6 (six) hours as needed.     Allergies:   Codeine and Sulfa antibiotics   Social History   Tobacco Use  . Smoking status: Former Smoker    Packs/day: 2.00    Years: 50.00    Pack years: 100.00    Types: Cigarettes    Quit date: 03/16/2018    Years since quitting: 1.5    . Smokeless tobacco: Never Used  . Tobacco comment: 1/2 ppd 12/30/16-lmr  Substance Use Topics  . Alcohol use: Yes    Alcohol/week: 0.0 standard drinks    Comment: 2 beers a day "couple beers"  . Drug use: No     Family Hx: The patient's family history includes Brain cancer in his brother; Breast cancer in his mother; Heart disease in his father; Other in his sister.  ROS:   Please see the history of present illness.    All other systems reviewed and are negative.   Prior CV studies:   The following studies were reviewed today: Echocardiogram 12/27/2018 . The left ventricle has normal systolic function with an ejection  fraction of 60-65%. The cavity size was normal. There is mild concentric  left ventricular hypertrophy. Left ventricular diastolic Doppler  parameters are consistent with impaired  relaxation. Indeterminate filling pressures No evidence of left  ventricular regional wall motion abnormalities.  2. The right ventricle has normal systolic function. The cavity was  normal. There is no increase in right ventricular wall thickness.  3. The aortic valve is tricuspid. Mild thickening of the aortic valve.  Mild calcification of the aortic valve. Aortic valve regurgitation is mild  by color flow Doppler. Mild-moderate stenosis of the aortic valve. Severe  aortic annular calcification  noted.  4. The mitral valve is grossly normal. Mild thickening of the mitral  valve leaflet. There is mild mitral annular calcification present.  5. The tricuspid valve is grossly normal.  6. The aorta is normal in size and structure.    Labs/Other Tests and Data Reviewed:    EKG:  No ECG reviewed.  Recent Labs: 11/14/2018: ALT 39; Hemoglobin 15.7; Platelets 233.0; Pro B Natriuretic peptide (BNP) 32.0; TSH 2.00 01/04/2019: BUN 12; Creatinine, Ser 0.94; Potassium 4.1; Sodium 140   Recent Lipid Panel Lab Results  Component Value Date/Time   CHOL 154 04/08/2017 03:04 PM   TRIG 114  04/08/2017 03:04 PM   HDL 48 04/08/2017 03:04 PM   CHOLHDL 3.2 04/08/2017 03:04 PM   LDLCALC 83 04/08/2017 03:04 PM    Wt Readings from Last 3 Encounters:  10/18/19 230 lb (104.3 kg)  05/21/19 234 lb 12.8 oz (106.5 kg)  02/13/19 227 lb (103 kg)     Objective:    Vital Signs:  BP 137/71   Pulse 74   Ht 5' 10.5" (1.791 m)   Wt 230 lb (104.3 kg)   BMI 32.54 kg/m    VITAL SIGNS:  reviewed GEN:  no acute distress RESPIRATORY:  normal respiratory effort, symmetric expansion NEURO:  alert and oriented x 3, no obvious focal deficit PSYCH:  normal affect  ASSESSMENT & PLAN:    1. PAD: Most recent l.  However duplex showed moderate bilateral iliac disease and moderate bilateral common femoral artery disease with mild FSA disease and three-vessel runoff bilaterally with normal ABI's. Marland Kitchen  He is being treated medically for this. Will see Dr. Fletcher Anon in 3 months for evaluation. He continues to have neurologic pain.   2. Chronic Diastolic CHF: He denies edema. He has COPD and therefore continues to have chronic DOE, but no worse than usual. He is not very ambulatory due to PAD and neurologic pain.   3. COPD: O2 for support. Followed by PCP  4. Neuropathic pain: See's Dr. Posey Pronto  Advised he call that office for refills on gabapentin and possible dosing change from BID to TID.  Defer to Dr. Posey Pronto.     COVID-19 Education: The signs and symptoms of COVID-19 were discussed with the patient and how to seek care for testing (follow up with PCP or arrange E-visit).  The importance of social distancing was discussed today.  Time:   Today, I have spent 20 minutes with the patient with telehealth technology discussing the above problems.     Medication Adjustments/Labs and Tests Ordered: Current medicines are reviewed at length with the patient today.  Concerns regarding medicines are outlined above.   Tests Ordered: No orders of the defined types were placed in this encounter.   Medication  Changes: No orders of the defined types were placed in this encounter.   Disposition:  Follow up 3 months   Signed, Phill Myron. West Pugh, ANP, AACC  10/18/2019 11:52 AM    [facility]

## 2019-11-19 ENCOUNTER — Ambulatory Visit: Payer: Medicare Other | Admitting: Internal Medicine

## 2019-12-07 ENCOUNTER — Other Ambulatory Visit: Payer: Self-pay

## 2019-12-07 ENCOUNTER — Ambulatory Visit: Payer: Medicare Other | Admitting: Internal Medicine

## 2019-12-07 ENCOUNTER — Encounter: Payer: Self-pay | Admitting: Internal Medicine

## 2019-12-07 DIAGNOSIS — J9611 Chronic respiratory failure with hypoxia: Secondary | ICD-10-CM | POA: Diagnosis not present

## 2019-12-07 DIAGNOSIS — J449 Chronic obstructive pulmonary disease, unspecified: Secondary | ICD-10-CM

## 2019-12-07 NOTE — Progress Notes (Signed)
Subjective:   Patient ID: Joel Perez, male    DOB: [birth year]   MRN: [id number]    Brief patient profile:  14 yowm quit smoking 02/2018/MM  referred by Dr Moreen Fowler for eval of copd with GOLD II criteria established 02/09/13    History of Present Illness  12/27/2012 1st pulmonary eval/ Morgen Ritacco cc onset x 2009 am cough cough/ congestion and progresive doe with white mucus x sev tbsp x sev min better p proaire and maintained on spiriva which he feels doesn't work as well as advair. Doe x one flight steps, dragging the garbage to street stops x 2. He denies cough to me but actually has a rattling with cough maneuver but doesn't bring up much mucus, what he does bring up is mostly in ams and mucoid and some better since quit smoking  rec symbicort 160 Take 2 puffs first thing in am and then another 2 puffs about 12 hours later.  Only use your albuterol (proaire) as a rescue medication to be used if you can't catch your breath by resting or doing a relaxed purse lip breathing pattern. The less you use it, the better it will work when you need it.  As you improve ok to stop spiriva  Please schedule a follow up office visit in 6 weeks, call sooner if needed with pft's    09/22/2015  f/u ov/Ninoska Goswick re: GOLD III copd/ maint rx = symbicort/ spiriva resp We will be referring you to rehab at [facility]        10/03/2017  f/u ov/Giuliano Preece re:  GOLD III still smoking  / having trouble affording meds Chief Complaint  Patient presents with  . Follow-up    Increased SOB over the past 2 wks. He is using his proair 4-5 x per day and neb once daily on average.  He states he gets SOB walking just short distances. He did try round of pred and this helped some. He has been coughing up some clear sputum.    Dyspnea:  Still doe = MMRC3 = can't walk 100 yards even at a slow pace at a flat grade s stopping due to sob   Cough: clear / thick/ rattling worst in am  Sleep: R side / 2 pillows /no 02  SABA use:  Overusing unless on  prednisone rec Plan A = Automatic =  BEVESP  Take 2 puffs first thing in am and then another 2 puffs about 12 hours later.  Work on maintaining perfect  inhaler technique:   Plan B = Backup Only use your albuterol as a rescue medication Plan C = Crisis - only use your albuterol nebulizer if you first try Plan B and it fails to help > ok to use the nebulizer up to every 4 hours but if start needing it regularly call for immediate appointment Plan D = Deltasone, take x 2 daily until better then 1 daily x 5 days until better and stop  The key is to stop smoking completely before smoking completely stops you!  Please schedule a follow up visit in 3 months but call sooner if needed  with all medications /inhalers/ solutions in hand so we can verify exactly what you are taking. This includes all medications from all doctors and over the counters     07/05/2018  f/u ov/Annslee Tercero re:  GOLD III / maint on symb 160 2bid  Chief Complaint  Patient presents with  . Follow-up    PFT done today. Breathing has  improved some. He uses his albuterol inhaler about once per day. He has not needed neb.   Dyspnea:  Treadmill at 1.5 mph at zero incline x 10 min stopped by back   / some [employer] ok MMRC3 = can't walk 100 yards even at a slow pace at a flat grade s stopping due to sob   Cough: none Sleeping: bed is flat/ 2 big pillows  SABA use: once a day  If has to go out of house for any reason  02: has but not using  rec Plan A = Automatic = symbicort 160 Take 2 puffs first thing in am and then another 2 puffs about 12 hours later.  And add spiriva 2 pffs each am  Work on inhaler technique:  Plan B = Backup Only use your albuterol inhaler as a rescue medication Plan C = Crisis - only use your albuterol nebulizer if you first try Plan B   Place your 0xygen on  when you walk to maintain the saturation above 90% or walk slower      11/14/2018  f/u ov/Parul Porcelli re:  Copd III no better on 20 mg prednisone daily  finished it and this time says no worse off and doesn't think spiriva helping either  Chief Complaint  Patient presents with  . Follow-up    Breathing is about the same. He is using his proair 2-3 x per day and neb 2 x per wk.  Dyspnea:  MMRC3 = can't walk 100 yards even at a slow pace at a flat grade s stopping due to sob   Cough: minimal  Sleeping: on bed / flat/ 2 pillows  SABA use: as above 02: has poc doesn't think it works  rec Try off the spiriva (think of it like high octane fuel) Goal for walking is to keep your 02 sats above 90% when walking    02/13/2019  f/u ov/Loyola Santino re: COPD GOLD III / 02 dep with activity  / doe not worse off spiriva/ has pred for prn use  Chief Complaint  Patient presents with  . Follow-up  Dyspnea:  Doing gxt x 10 min 1.0 mph flat stops due to hip not sob/ sats ? In 80s but not using 02 correctly   Cough: better  Sleeping: on R side/ 2 pillows  SABA use:  02: not using at hs / using 3lpm cooking / not consistently using while walking  rec Continue symbicort 160  Take 2 puffs first thing in am and then another 2 puffs about 12 hours later.  Be sure to wear 02 2.5-3lpm when walking for exercise to keep the saturations consistently above 90% - this will help your heart and circulation and help you burn fat Please schedule a follow up visit in 3 months but call sooner if needed  Add ? Add breztri next ov ? If insurance will cover   05/21/2019  f/u ov/Rogena Deupree re:  Copd  III  No longer on spiriva / pred is plan D Chief Complaint  Patient presents with  . Follow-up    SOB on extertion. Patient denies any coughing.  Dyspnea:  Doing gxt x 15 min s stopping @ grade = flat at 1.1 mph on 3lpm not checking sats  Cough: no cough  Sleeping: on side / 2 pillows  SABA use: neb before activity 02: 3lpm with activity not checking levels / no using at hs / not using it at rest rec Plan A = Automatic = Always=  Breztri  Take 2 puffs first thing in am and then  another 2 puffs about 12 hours later.  Plan B = Backup (to supplement plan A, not to replace it) Only use your albuterol inhaler as a rescue medication  Plan C = Crisis (instead of Plan B but only if Plan B stops working) - only use your albuterol nebulizer if you first try Plan B Plan D = Prednisone 10 mg  Take 2 daily until better then 1 daily x 5 days and stop Make sure you check your oxygen saturations at highest level of activity to be sure it stays over 90% and adjust upward to maintain this level if needed but remember to turn it back to previous settings when you stop (to conserve your supply)     12/07/2019  f/u ov/Joseantonio Dittmar re:  COPD  III/breztri  Plus saba tid never prechallenge/ prednisone 3 daily "on way down" (this was not rec)  Chief Complaint  Patient presents with  . Follow-up  Dyspnea: limited by leg from doing treadmill but walking [place] and stops half way x 20 min loop @ 3lpm sat > 90% lift hip also limiting Cough: better  Sleeping: bed flat/ 2 pillows  SABA use: as above   02: 3lpm  with acitivity only    No obvious day to day or daytime variability or assoc excess/ purulent sputum or mucus plugs or hemoptysis or cp or chest tightness, subjective wheeze or overt sinus or hb symptoms.   sleeping without nocturnal  or early am exacerbation  of respiratory  c/o's or need for noct saba. Also denies any obvious fluctuation of symptoms with weather or environmental changes or other aggravating or alleviating factors except as outlined above   No unusual exposure hx or h/o childhood pna/ asthma or knowledge of premature birth.  Current Allergies, Complete Past Medical History, Past Surgical History, Family History, and Social History were reviewed in [employer] record.  ROS  The following are not active complaints unless bolded Hoarseness, sore throat, dysphagia, dental problems, itching, sneezing,  nasal congestion or discharge of excess mucus or  purulent secretions, ear ache,   fever, chills, sweats, unintended wt loss or wt gain, classically pleuritic or exertional cp,  orthopnea pnd or arm/hand swelling  or leg swelling, presyncope, palpitations, abdominal pain, anorexia, nausea, vomiting, diarrhea  or change in bowel habits or change in bladder habits, change in stools or change in urine, dysuria, hematuria,  rash, arthralgias, visual complaints, headache, numbness, weakness or ataxia or problems with walking or coordination,  change in mood or  memory.        Current Meds  Medication Sig  . albuterol (PROVENTIL) (2.5 MG/3ML) 0.083% nebulizer solution USE 1 VIAL IN NEBULIZER EVERY 6 HOURS AS NEEDED FOR WHEEZING OR  SHORTNESS  OF  BREATH  . aspirin 81 MG tablet Take 81 mg by mouth daily.  Marland Kitchen atorvastatin (LIPITOR) 40 MG tablet Take 1 tablet by mouth daily  . b complex vitamins tablet Take 1 tablet by mouth daily.  . Budeson-Glycopyrrol-Formoterol (BREZTRI AEROSPHERE) 160-9-4.8 MCG/ACT AERO Inhale 2 puffs into the lungs 2 (two) times daily.  . cyclobenzaprine (FLEXERIL) 10 MG tablet Take 10 mg by mouth 3 (three) times daily as needed.  . furosemide (LASIX) 20 MG tablet Take 1 tablet by mouth daily  . gabapentin (NEURONTIN) 300 MG capsule TAKE 1 CAPSULE BY MOUTH AT BEDTIME FOR  1  WEEK  THEN  INCREASE  TO  1  CAPSULE  TWICE  DAILY  . losartan (COZAAR) 25 MG tablet Take 25 mg by mouth daily.  . Multiple Vitamin (MULTIVITAMIN) capsule Take 1 capsule by mouth daily.  Marland Kitchen oxyCODONE-acetaminophen (PERCOCET) 10-325 MG tablet Take 1 tablet by mouth 2 (two) times daily as needed.  . potassium chloride SA (KLOR-CON M20) 20 MEQ tablet Take 1 tablet (20 mEq total) by mouth daily.  . predniSONE (DELTASONE) 10 MG tablet Take 2 tablets (20 mg total) by mouth daily. As directed  . PROAIR HFA 108 (90 Base) MCG/ACT inhaler INHALE 2 PUFFS BY MOUTH EVERY 6 HOURS AS NEEDED FOR WHEEZING AND FOR SHORTNESS OF BREATH  . Sildenafil Citrate (VIAGRA PO) Take 1 tablet  by mouth as needed.  . sodium chloride (OCEAN) 0.65 % nasal spray Place 1 spray into the nose as needed for congestion.  . [DISCONTINUED] SYMBICORT 160-4.5 MCG/ACT inhaler TAKE 2 PUFFS FIRST THING IN THE MORNING AND THEN TAKE ANOTHER 2 PUFFS 12 HOURS LATER              Objective:   Physical Exam    12/07/2019     227  05/21/2019   234  02/13/2019     227  01/18/2014       179 > 02/07/2015 190 > 03/21/2015   192 > 06/23/2015  196 >   12/30/2015 198 > 03/31/2016 198 >  07/02/2016  201 > 12/30/2016 201 >  04/01/2017   195 >  07/04/2017  201 > 10/03/2017  197 >   01/03/2018  206 > 04/05/2018    206> 05/23/2018  211 > 07/05/2018  217 > 11/14/2018  225     02/09/13 177 lb (80.287 kg)  12/27/12 173 lb 12.8 oz (78.835 kg)    Vital signs reviewed  12/07/2019  - Note at rest 02 sats  98% on 3lpm cont          HEENT : pt wearing mask not removed for exam due to covid - 19 concerns.    NECK :  without JVD/Nodes/TM/ nl carotid upstrokes bilaterally   LUNGS: no acc muscle use,  Mild barrel  contour chest wall with bilateral  Distant bs s audible wheeze and  without cough on insp or exp maneuvers  and mild  Hyperresonant  to  percussion bilaterally     CV:  RRR  no s3 or murmur or increase in P2, and no edema   ABD:  soft and nontender with pos end  insp Hoover's  in the supine position. No bruits or organomegaly appreciated, bowel sounds nl  MS:   Nl gait/  ext warm without deformities, calf tenderness, cyanosis or clubbing No obvious joint restrictions   SKIN: warm and dry without lesions    NEURO:  alert, approp, nl sensorium with  no motor or cerebellar deficits apparent.                    Assessment & Plan:

## 2019-12-07 NOTE — Patient Instructions (Addendum)
No change in medications   Please schedule a follow up visit in 6  months but call sooner if needed  - [place] office

## 2019-12-08 ENCOUNTER — Encounter: Payer: Self-pay | Admitting: Internal Medicine

## 2019-12-08 NOTE — Assessment & Plan Note (Signed)
Quit smoking 02/2018  - MM phenotype  - 02/09/2013  FEV1 2.14 (68%) ratio 60 p 30% better from B2 and dloc 52 corrects to 69 - 02/07/2015  extensive coaching HFA effectiveness =    90% from a baseline of 75%  - 02/07/2015  Walked RA x 3 laps @ 185 ft each stopped due to  End of study, nl pace, no sob or desat   - PFT's  03/21/2015  FEV1 1.92 (62 % ) ratio 60  p 14 % improvement from saba with DLCO  47 % corrects to 62 % for alv volume   - trial of spiriva respimat 03/21/2015  With goal to be able to walk up inclines > no better 06/23/2015 > try bevespi 2 bid x 3 weeks  -sob   Worse p 3 weeks bevespi  so resumed symb/spirva 06/25/2015  - referred to rehab 09/22/2015 >>> d/c'd p 19 sessions on 01/22/16 due to costs - 03/31/2016 rec pred x 6 days if needed  - 12/30/2016    restart spiriva 2.5 x 2 each am > could not afford it  - 04/01/2017    try bevespi  - 07/04/2017    continue beveslop and pred as Plan D but misunderstood and took daily  - 01/03/2018 daliresp trial in effort to reduced pred dependency > could not afford  - quit smoking again 02/2018  - 05/23/2018  After extensive coaching inhaler device,  effectiveness =    90% from a baseline of 75% > try symb 160 again as seems much better while on prednisone and if worse then add back spiriva  - Alpha one AT screen 05/23/2018   MM level 125  - PFT's  07/05/2018  FEV1 1.29 (43 % ) ratio 0.49  p 36 % improvement from saba p symb 160 x 2  prior to study with DLCO  48 % corrects to 67  % for alv volume   - 07/05/2018    try spiriva 2.5 x 2 puffs each am > no better on it 11/14/2018 so rec trial off  - 11/14/2018   Continue symbicort 160 2bid only plus prednisone as plan D - 05/21/2019  After extensive coaching inhaler device,  effectiveness =    90% so try breztri    Group D in terms of symptom/risk and laba/lama/ICS  therefore appropriate rx at this point >>>  breztri approp  I spent extra time with pt today reviewing appropriate use of albuterol for prn use on  exertion with the following points: 1) saba is for relief of sob that does not improve by walking a slower pace or resting but rather if the pt does not improve after trying this first. 2) If the pt is convinced, as many are, that saba helps recover from activity faster then it's easy to tell if this is the case by re-challenging : ie stop, take the inhaler, then p 5 minutes try the exact same activity (intensity of workload) that just caused the symptoms and see if they are substantially diminished or not after saba 3) if there is an activity that reproducibly causes the symptoms, try the saba 15 min before the activity on alternate days   If in fact the saba really does help, then fine to continue to use it prn but advised may need to look closer at the maintenance regimen being used to achieve better control of airways disease with exertion.

## 2019-12-08 NOTE — Assessment & Plan Note (Signed)
Started on 02 with amb 2019 05/23/2018   Walked RA x one lap = 210 ft - stopped due to  desats to 88% then walked second lap on 3lpm POC and sats 89% at end , slow pace  - 11/14/2018 Patient Saturations on Room Air at Rest = 96  %   while Ambulating = 88%   Then  2  Liters of oxygen while Ambulating = 94% - 02/13/2019   Walked RA x one lap =  approx 250 ft - stopped due to  desats to 88% placed on 2.5 lpm and then completed second lap s desats  Reviewed goal of > 90% at at all times          Each maintenance medication was reviewed in detail including emphasizing most importantly the difference between maintenance and prns and under what circumstances the prns are to be triggered using an action plan format where appropriate.  Total time for H and P, chart review, counseling, teaching device and generating customized AVS unique to this office visit / charting = 20 min

## 2019-12-31 ENCOUNTER — Other Ambulatory Visit: Payer: Self-pay | Admitting: Internal Medicine

## 2020-01-08 ENCOUNTER — Ambulatory Visit: Payer: Medicare Other | Admitting: Cardiovascular Disease

## 2020-01-08 ENCOUNTER — Encounter: Payer: Self-pay | Admitting: Cardiovascular Disease

## 2020-01-08 ENCOUNTER — Other Ambulatory Visit: Payer: Self-pay

## 2020-01-08 VITALS — BP 156/72 | HR 97 | Ht 70.0 in | Wt 220.0 lb

## 2020-01-08 DIAGNOSIS — E785 Hyperlipidemia, unspecified: Secondary | ICD-10-CM | POA: Diagnosis not present

## 2020-01-08 DIAGNOSIS — R0609 Other forms of dyspnea: Secondary | ICD-10-CM

## 2020-01-08 DIAGNOSIS — I739 Peripheral vascular disease, unspecified: Secondary | ICD-10-CM | POA: Diagnosis not present

## 2020-01-08 DIAGNOSIS — I359 Nonrheumatic aortic valve disorder, unspecified: Secondary | ICD-10-CM | POA: Diagnosis not present

## 2020-01-08 DIAGNOSIS — R0600 Dyspnea, unspecified: Secondary | ICD-10-CM

## 2020-01-08 MED ORDER — LOSARTAN POTASSIUM 50 MG PO TABS: 50.0000 mg | ORAL_TABLET | Freq: Every day | ORAL | 3 refills | Status: DC

## 2020-01-08 NOTE — Progress Notes (Signed)
Cardiology Office Note   Date:  01/08/2020   ID:  Joel Perez, DOB [birth year], MRN [id number]  PCP:  Joel Contras, MD  Cardiologist:  Dr. Fletcher Perez No chief complaint on file.     History of Present Illness: Joel Perez is a 74 y.o. male who is here today for follow-up visit regarding peripheral arterial disease. He has known history of chronic diastolic heart failure, tobacco use, abnormal EKG with IVCD resembling left bundle branch block, aortic valve disease and COPD.   He is known to have leg edema thought to be due to chronic venous insufficiency with stasis dermatitis. He has known history of peripheral arterial disease and peripheral neuropathy. Noninvasive vascular testing in 2018 showed normal ABI bilaterally. Duplex showed moderate bilateral iliac disease and moderate bilateral common femoral artery disease with mild SFA disease and three-vessel runoff bilaterally. He was treating medically for peripheral arterial disease.  He has COPD and currently is on home oxygen.  He quit smoking in 2020.   He has been doing reasonably well and continues to have numbness in both feet but no significant leg claudication.  He continues to use home oxygen.   Past Medical History:  Diagnosis Date  . 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 BPH (benign prostatic hyperplasia)   . Capsular cataract of left eye   . COPD (chronic obstructive pulmonary disease) (HCC)    Dr. Melvyn Perez  . Current smoker    Long-term smoker. Not interested in quitting  . Diverticulosis    With intermittent diverticulitis  . Erectile dysfunction   . Essential hypertension    Several recorded blood pressures greater than 140/90.  . H/O mitral valve prolapse    By report, but not confirmed by echo.  . Low back pain    Chronic. Followed by Dr. Suella Perez  . Mild aortic regurgitation 12/2013   Mild to moderate aortic regurgitation on echo  . Neuropathy     Past Surgical History:  Procedure  Laterality Date  . COLON SURGERY  2000  . ELBOW SURGERY    . TRANSTHORACIC ECHOCARDIOGRAM  12/29/2016   EF 60-65%. Moderate LVH. Mild aortic valve calcification. Mild to moderate regurgitation with no stenosis. Mitral valve annular calcification with no comment of prolapse or regurgitation.     Current Outpatient Medications  Medication Sig Dispense Refill  . albuterol (PROVENTIL) (2.5 MG/3ML) 0.083% nebulizer solution USE 1 VIAL IN NEBULIZER EVERY 6 HOURS AS NEEDED FOR WHEEZING OR  SHORTNESS  OF  BREATH 75 mL 0  . aspirin 81 MG tablet Take 81 mg by mouth daily.    Marland Kitchen atorvastatin (LIPITOR) 40 MG tablet Take 1 tablet by mouth daily 90 tablet 3  . b complex vitamins tablet Take 1 tablet by mouth daily.    . Budeson-Glycopyrrol-Formoterol (BREZTRI AEROSPHERE) 160-9-4.8 MCG/ACT AERO Inhale 2 puffs into the lungs 2 (two) times daily. 10.7 g 11  . cyclobenzaprine (FLEXERIL) 10 MG tablet Take 10 mg by mouth 3 (three) times daily as needed.    . furosemide (LASIX) 20 MG tablet Take 1 tablet by mouth daily 30 tablet 6  . gabapentin (NEURONTIN) 300 MG capsule TAKE 1 CAPSULE BY MOUTH AT BEDTIME FOR  1  WEEK  THEN  INCREASE  TO  1  CAPSULE  TWICE  DAILY 180 capsule 0  . losartan (COZAAR) 50 MG tablet Take 1 tablet (50 mg total) by mouth daily. 90 tablet 3  . Multiple Vitamin (  MULTIVITAMIN) capsule Take 1 capsule by mouth daily.    Marland Kitchen oxyCODONE-acetaminophen (PERCOCET) 10-325 MG tablet Take 1 tablet by mouth 2 (two) times daily as needed.    . potassium chloride SA (KLOR-CON M20) 20 MEQ tablet Take 1 tablet (20 mEq total) by mouth daily. 10 tablet 0  . predniSONE (DELTASONE) 10 MG tablet TAKE 2 TABLETS BY MOUTH ONCE DAILY AS DIRECTED 100 tablet 0  . PROAIR HFA 108 (90 Base) MCG/ACT inhaler INHALE 2 PUFFS BY MOUTH EVERY 6 HOURS AS NEEDED FOR WHEEZING AND FOR SHORTNESS OF BREATH 18 g 5  . Sildenafil Citrate (VIAGRA PO) Take 1 tablet by mouth as needed.    . sodium chloride (OCEAN) 0.65 % nasal spray Place 1  spray into the nose as needed for congestion.     No current facility-administered medications for this visit.    Allergies:   Codeine and Sulfa antibiotics    Social History:  The patient  reports that he quit smoking about 21 months ago. His smoking use included cigarettes. He has a 100.00 pack-year smoking history. He has never used smokeless tobacco. He reports current alcohol use. He reports that he does not use drugs.   Family History:  The patient's family history includes Brain cancer in his brother; Breast cancer in his mother; Heart disease in his father; Other in his sister.    ROS:  Please see the history of present illness.   Otherwise, review of systems are positive for none.   All other systems are reviewed and negative.    PHYSICAL EXAM: VS:  BP (!) 156/72   Pulse 97   Ht 5\' 10"  (1.778 m)   Wt 220 lb (99.8 kg)   SpO2 97%   BMI 31.57 kg/m  , BMI Body mass index is 31.57 kg/m. GEN: Well nourished, well developed, in no acute distress  HEENT: normal  Neck: no JVD, carotid bruits, or masses Cardiac: RRR; no  rubs, or gallops. 1/ 6 systolic ejection murmur in the aortic area. There is mild to moderate bilateral leg edema Respiratory:  clear to auscultation bilaterally, normal work of breathing GI: soft, nontender, nondistended, + BS MS: no deformity or atrophy  Skin: warm and dry, no rash Neuro:  Strength and sensation are intact Psych: euthymic mood, full affect Vascular: Femoral pulses are normal bilaterally.   EKG:  EKG is ordered today. EKG showed normal sinus rhythm.  Nonspecific IVCD.   Recent Labs: No results found for requested labs within last 8760 hours.    Lipid Panel    Component Value Date/Time   CHOL 154 04/08/2017 1504   TRIG 114 04/08/2017 1504   HDL 48 04/08/2017 1504   CHOLHDL 3.2 04/08/2017 1504   LDLCALC 83 04/08/2017 1504      Wt Readings from Last 3 Encounters:  01/08/20 220 lb (99.8 kg)  12/07/19 227 lb 9.6 oz (103.2 kg)    10/18/19 230 lb (104.3 kg)       PAD Screen 01/17/2017  Previous surgical procedure? Yes  Dates of procedures colon ressection  Pain with walking? Yes  Subsides with rest? Yes  Painful, non-healing ulcers? Yes  Extremities discolored? Yes      ASSESSMENT AND PLAN:  1.  Peripheral arterial disease:The patient has mild to moderate diffuse peripheral arterial disease with no evidence of obstructive stenosis.  He has minimal claudication.  Most of his symptoms seem to be related to peripheral neuropathy.  Continue medical therapy.   2.  Hyperlipidemia: Continue  treatment with atorvastatin.  Recommend a target LDL of less than 70.   4.  Essential hypertension: Blood pressure continues to be elevated.  I increase losartan to 50 mg daily.  5.  Aortic valve disease: Most recent echocardiogram in August 2020 showed mild aortic regurgitation and mild to moderate stenosis.  Recommend repeat echocardiogram in August 2022.   Disposition:   FU with me in 6 months  Signed,  Kathlyn Sacramento, MD  01/08/2020 11:38 AM    [place]

## 2020-01-08 NOTE — Patient Instructions (Signed)
Medication Instructions:  Increase Losartan to 50mg  Daily (1tablet)- called into pharmacy *If you need a refill on your cardiac medications before your next appointment, please call your pharmacy*   Lab Work: None Ordered At This Time.   If you have labs (blood work) drawn today and your tests are completely normal, you will receive your results only by: Marland Kitchen MyChart Message (if you have MyChart) OR . A paper copy in the mail If you have any lab test that is abnormal or we need to change your treatment, we will call you to review the results.   Testing/Procedures: None Ordered At This Time.     Follow-Up: At [facility], you and your health needs are our priority.  As part of our continuing mission to provide you with exceptional heart care, we have created designated Provider Care Teams.  These Care Teams include your primary Cardiologist (physician) and Advanced Practice Providers (APPs -  Physician Assistants and Nurse Practitioners) who all work together to provide you with the care you need, when you need it.  We recommend signing up for the patient portal called "MyChart".  Sign up information is provided on this After Visit Summary.  MyChart is used to connect with patients for Virtual Visits (Telemedicine).  Patients are able to view lab/test results, encounter notes, upcoming appointments, etc.  Non-urgent messages can be sent to your provider as well.   To learn more about what you can do with MyChart, go to [email or web address].    Your next appointment:   6 month(s)  The format for your next appointment:   In Person  Provider:   Kathlyn Sacramento, MD   Other Instructions

## 2020-01-12 ENCOUNTER — Other Ambulatory Visit: Payer: Self-pay | Admitting: Internal Medicine

## 2020-01-30 ENCOUNTER — Other Ambulatory Visit: Payer: Self-pay | Admitting: Internal Medicine

## 2020-02-14 ENCOUNTER — Other Ambulatory Visit: Payer: Self-pay | Admitting: Gastroenterology

## 2020-02-25 ENCOUNTER — Other Ambulatory Visit: Payer: Self-pay | Admitting: Internal Medicine

## 2020-02-25 ENCOUNTER — Encounter: Payer: Self-pay | Admitting: Internal Medicine

## 2020-02-25 ENCOUNTER — Telehealth: Payer: Self-pay

## 2020-02-25 MED ORDER — PREDNISONE 10 MG PO TABS: ORAL_TABLET | ORAL | 0 refills | Status: DC

## 2020-02-25 NOTE — Telephone Encounter (Signed)
Looks like we just gave 100 today and that's fine

## 2020-02-28 ENCOUNTER — Other Ambulatory Visit: Payer: Self-pay | Admitting: Internal Medicine

## 2020-03-04 ENCOUNTER — Encounter (HOSPITAL_COMMUNITY): Payer: Self-pay | Admitting: Gastroenterology

## 2020-03-14 ENCOUNTER — Other Ambulatory Visit (HOSPITAL_COMMUNITY)
Admission: RE | Admit: 2020-03-14 | Discharge: 2020-03-14 | Disposition: A | Discharge status: Home / Self Care | Payer: Medicare Other | Source: Ambulatory Visit | Attending: Gastroenterology | Admitting: Gastroenterology

## 2020-03-14 DIAGNOSIS — Z20822 Contact with and (suspected) exposure to covid-19: Secondary | ICD-10-CM | POA: Diagnosis not present

## 2020-03-14 DIAGNOSIS — Z01812 Encounter for preprocedural laboratory examination: Secondary | ICD-10-CM | POA: Insufficient documentation

## 2020-03-14 LAB — SARS CORONAVIRUS 2 (TAT 6-24 HRS): SARS Coronavirus 2: NEGATIVE

## 2020-03-17 NOTE — Progress Notes (Signed)
Pre op call complete for endo procedure tomorrow. Patient states he has been quarantined since covid test, has clear instructions on his colon prep, and has a ride arranged to take him home after procedure. All questions addressed.

## 2020-03-18 ENCOUNTER — Ambulatory Visit (HOSPITAL_COMMUNITY): Payer: Medicare Other | Admitting: Certified Registered Nurse Anesthetist

## 2020-03-18 ENCOUNTER — Encounter (HOSPITAL_COMMUNITY): Payer: Self-pay | Admitting: Gastroenterology

## 2020-03-18 ENCOUNTER — Other Ambulatory Visit: Payer: Self-pay

## 2020-03-18 ENCOUNTER — Encounter (HOSPITAL_COMMUNITY)
Admission: RE | Disposition: A | Discharge status: Home / Self Care | Payer: Self-pay | Source: Home / Self Care | Attending: Gastroenterology

## 2020-03-18 ENCOUNTER — Ambulatory Visit (HOSPITAL_COMMUNITY)
Admission: RE | Admit: 2020-03-18 | Discharge: 2020-03-18 | Disposition: A | Discharge status: Home / Self Care | Payer: Medicare Other | Attending: Gastroenterology | Admitting: Gastroenterology

## 2020-03-18 DIAGNOSIS — Z980 Intestinal bypass and anastomosis status: Secondary | ICD-10-CM | POA: Insufficient documentation

## 2020-03-18 DIAGNOSIS — K648 Other hemorrhoids: Secondary | ICD-10-CM | POA: Diagnosis not present

## 2020-03-18 DIAGNOSIS — K644 Residual hemorrhoidal skin tags: Secondary | ICD-10-CM | POA: Diagnosis not present

## 2020-03-18 DIAGNOSIS — D125 Benign neoplasm of sigmoid colon: Secondary | ICD-10-CM | POA: Insufficient documentation

## 2020-03-18 DIAGNOSIS — D124 Benign neoplasm of descending colon: Secondary | ICD-10-CM | POA: Insufficient documentation

## 2020-03-18 DIAGNOSIS — Z882 Allergy status to sulfonamides status: Secondary | ICD-10-CM | POA: Diagnosis not present

## 2020-03-18 DIAGNOSIS — Z1211 Encounter for screening for malignant neoplasm of colon: Secondary | ICD-10-CM | POA: Diagnosis not present

## 2020-03-18 DIAGNOSIS — Z87891 Personal history of nicotine dependence: Secondary | ICD-10-CM | POA: Insufficient documentation

## 2020-03-18 DIAGNOSIS — Z885 Allergy status to narcotic agent status: Secondary | ICD-10-CM | POA: Diagnosis not present

## 2020-03-18 DIAGNOSIS — Z86010 Personal history of colonic polyps: Secondary | ICD-10-CM | POA: Diagnosis not present

## 2020-03-18 DIAGNOSIS — D123 Benign neoplasm of transverse colon: Secondary | ICD-10-CM | POA: Diagnosis not present

## 2020-03-18 DIAGNOSIS — Z9049 Acquired absence of other specified parts of digestive tract: Secondary | ICD-10-CM | POA: Insufficient documentation

## 2020-03-18 HISTORY — PX: COLONOSCOPY WITH PROPOFOL: SHX5780

## 2020-03-18 HISTORY — PX: POLYPECTOMY: SHX5525

## 2020-03-18 SURGERY — COLONOSCOPY WITH PROPOFOL
Anesthesia: Monitor Anesthesia Care

## 2020-03-18 MED ORDER — PROPOFOL 500 MG/50ML IV EMUL
INTRAVENOUS | Status: DC | PRN
  Administered 2020-03-18: 150 ug/kg/min via INTRAVENOUS

## 2020-03-18 MED ORDER — LIDOCAINE 2% (20 MG/ML) 5 ML SYRINGE
INTRAMUSCULAR | Status: DC | PRN
  Administered 2020-03-18: 80 mg via INTRAVENOUS

## 2020-03-18 MED ORDER — PROPOFOL 1000 MG/100ML IV EMUL
INTRAVENOUS | Status: AC
  Filled 2020-03-18: qty 100

## 2020-03-18 MED ORDER — LACTATED RINGERS IV SOLN: INTRAVENOUS | Status: DC

## 2020-03-18 MED ORDER — SODIUM CHLORIDE 0.9 % IV SOLN: INTRAVENOUS | Status: DC

## 2020-03-18 MED ORDER — PROPOFOL 10 MG/ML IV BOLUS
INTRAVENOUS | Status: AC
  Filled 2020-03-18: qty 20

## 2020-03-18 MED ORDER — PROPOFOL 10 MG/ML IV BOLUS
INTRAVENOUS | Status: DC | PRN
  Administered 2020-03-18: 30 mg via INTRAVENOUS

## 2020-03-18 MED ORDER — LACTATED RINGERS IV SOLN: INTRAVENOUS | Status: DC | PRN

## 2020-03-18 MED ORDER — PROPOFOL 500 MG/50ML IV EMUL
INTRAVENOUS | Status: AC
  Filled 2020-03-18: qty 50

## 2020-03-18 SURGICAL SUPPLY — 21 items

## 2020-03-18 NOTE — Transfer of Care (Signed)
Immediate Anesthesia Transfer of Care Note  Patient: Joel Perez  Procedure(s) Performed: COLONOSCOPY WITH PROPOFOL (N/A ) POLYPECTOMY  Patient Location: Endoscopy Unit  Anesthesia Type:MAC  Level of Consciousness: awake, alert , oriented and patient cooperative  Airway & Oxygen Therapy: Patient Spontanous Breathing and Patient connected to face mask oxygen  Post-op Assessment: Report given to RN, Post -op Vital signs reviewed and stable and Patient moving all extremities  Post vital signs: Reviewed and stable  Last Vitals:  Vitals Value Taken Time  BP    Temp    Pulse 73 03/18/20 1033  Resp 11 03/18/20 1033  SpO2 100 % 03/18/20 1033  Vitals shown include unvalidated device data.  Last Pain:  Vitals:   03/18/20 0909  TempSrc: Oral  PainSc: 0-No pain         Complications: No complications documented.

## 2020-03-18 NOTE — Anesthesia Postprocedure Evaluation (Signed)
Anesthesia Post Note  Patient: Joel Perez  Procedure(s) Performed: COLONOSCOPY WITH PROPOFOL (N/A ) POLYPECTOMY     Patient location during evaluation: Endoscopy Anesthesia Type: MAC Level of consciousness: awake and alert, patient cooperative and oriented Pain management: pain level controlled Vital Signs Assessment: post-procedure vital signs reviewed and stable Respiratory status: spontaneous breathing, nonlabored ventilation and respiratory function stable Cardiovascular status: stable and blood pressure returned to baseline Postop Assessment: no apparent nausea or vomiting Anesthetic complications: no   No complications documented.  Last Vitals:  Vitals:   03/18/20 1040 03/18/20 1050  BP: (!) 165/71 (!) 146/71  Pulse: 74 74  Resp: 16 15  Temp:    SpO2: 98% 97%    Last Pain:  Vitals:   03/18/20 1050  TempSrc:   PainSc: 0-No pain                 Ani Deoliveira,E. Tyvon Eggenberger

## 2020-03-18 NOTE — Anesthesia Preprocedure Evaluation (Addendum)
Anesthesia Evaluation  Patient identified by MRN, date of birth, ID band Patient awake    Reviewed: Allergy & Precautions, NPO status , Patient's Chart, lab work & pertinent test results  History of Anesthesia Complications Negative for: history of anesthetic complications  Airway Mallampati: II  TM Distance: >3 FB Neck ROM: Full    Dental  (+) Dental Advisory Given, Missing, Chipped   Pulmonary COPD,  COPD inhaler and oxygen dependent, former smoker,  03/14/2020 SARS coronavirus NEG   breath sounds clear to auscultation       Cardiovascular hypertension, Pt. on medications (-) angina+ DOE   Rhythm:Regular Rate:Normal  '20 ECHO: EF 60-65% with mild LVH, Aortic valve regurgitation is mild. Mild-moderate stenosis of the aortic valve. Severe aortic annular calcification, mild MR   Neuro/Psych negative neurological ROS     GI/Hepatic negative GI ROS, (+)     substance abuse  alcohol use,   Endo/Other  Morbid obesity  Renal/GU negative Renal ROS     Musculoskeletal  (+) narcotic dependent  Abdominal (+) + obese,   Peds  Hematology negative hematology ROS (+)   Anesthesia Other Findings   Reproductive/Obstetrics                            Anesthesia Physical Anesthesia Plan  ASA: III  Anesthesia Plan: MAC   Post-op Pain Management:    Induction:   PONV Risk Score and Plan: 1  Airway Management Planned: Natural Airway and Simple Face Mask  Additional Equipment: None  Intra-op Plan:   Post-operative Plan:   Informed Consent: I have reviewed the patients History and Physical, chart, labs and discussed the procedure including the risks, benefits and alternatives for the proposed anesthesia with the patient or authorized representative who has indicated his/her understanding and acceptance.     Dental advisory given  Plan Discussed with: CRNA and Surgeon  Anesthesia Plan  Comments:        Anesthesia Quick Evaluation

## 2020-03-18 NOTE — H&P (Signed)
Primary Care Physician:  [name], MD Primary Gastroenterologist:  Dr. Alessandra Bevels  Reason for Consultation: Positive fit test  HPI: Joel Perez is a 74 y.o. male here for outpatient colonoscopy for positive fit test.  Patient with history of end-stage COPD on oxygen supplements. Patient with past medical history of complicated diverticulitis requiring partial sigmoid colon resection in 2000. Underwent colonoscopy in May 2014 for evaluation of heme positive stool which showed 2 adenomatous polyp in the transverse colon and patent end to end anastomosis in sigmoid colon. Repeat colonoscopy was recommended in 5 years.        Blood work in December 2020 showed normal CBC. Normal LFTs in July 2021.        He is complaining of constipation with one bowel movement every 2-3 days. Use over-the-counter laxatives. Denies any abdominal pain, nausea or vomiting. Denies reflux, dysphagia odynophagia. Denies any blood in the stool or black stool.        He is on chronic narcotic pain medication.  Past Medical History:  Diagnosis Date  . 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 BPH (benign prostatic hyperplasia)   . Capsular cataract of left eye   . COPD (chronic obstructive pulmonary disease) (HCC)    Dr. [name]  . Current smoker    Long-term smoker. Not interested in quitting  . Diverticulosis    With intermittent diverticulitis  . Erectile dysfunction   . Essential hypertension    Several recorded blood pressures greater than 140/90.  . H/O mitral valve prolapse    By report, but not confirmed by echo.  . Low back pain    Chronic. Followed by Dr. Suella Broad  . Mild aortic regurgitation 12/2013   Mild to moderate aortic regurgitation on echo  . Neuropathy     Past Surgical History:  Procedure Laterality Date  . COLON SURGERY  2000  . ELBOW SURGERY    . TRANSTHORACIC ECHOCARDIOGRAM  12/29/2016   EF 60-65%. Moderate LVH. Mild aortic valve calcification. Mild to moderate  regurgitation with no stenosis. Mitral valve annular calcification with no comment of prolapse or regurgitation.    Prior to Admission medications   Medication Sig Start Date End Date Taking? Authorizing Provider  albuterol (PROVENTIL) (2.5 MG/3ML) 0.083% nebulizer solution USE 1 VIAL IN NEBULIZER EVERY 6 HOURS AS NEEDED FOR WHEEZING AND FOR SHORTNESS OF BREATH Patient taking differently: Take 2.5 mg by nebulization every 6 (six) hours as needed for wheezing or shortness of breath.  01/14/20  Yes Tanda Rockers, MD  aspirin EC 81 MG tablet Take 81 mg by mouth daily. Swallow whole.   Yes [provider]  atorvastatin (LIPITOR) 40 MG tablet Take 1 tablet by mouth daily Patient taking differently: Take 40 mg by mouth daily.  04/27/19  Yes Wellington Hampshire, MD  Budeson-Glycopyrrol-Formoterol (BREZTRI AEROSPHERE) 160-9-4.8 MCG/ACT AERO Inhale 2 puffs into the lungs 2 (two) times daily. 05/21/19  Yes Tanda Rockers, MD  furosemide (LASIX) 20 MG tablet Take 1 tablet by mouth daily Patient taking differently: Take 20 mg by mouth daily.  09/07/19  Yes Cleaver, Jossie Ng, NP  gabapentin (NEURONTIN) 600 MG tablet Take 600 mg by mouth every 8 (eight) hours. 02/25/20  Yes [provider]  ibuprofen (ADVIL) 200 MG tablet Take 400 mg by mouth every 8 (eight) hours as needed (PAIN).   Yes [provider]  losartan (COZAAR) 50 MG tablet Take 1 tablet (50 mg total) by mouth  daily. 01/08/20  Yes Wellington Hampshire, MD  Multiple Vitamin (MULTIVITAMIN WITH MINERALS) TABS tablet Take 1 tablet by mouth daily.   Yes [provider]  oxyCODONE-acetaminophen (PERCOCET) 10-325 MG tablet Take 1 tablet by mouth 2 (two) times daily as needed (back pain.).    Yes [provider]  predniSONE (DELTASONE) 10 MG tablet TAKE 2 TABLETS BY MOUTH ONCE DAILY AS DIRECTED Patient taking differently: Take 20 mg by mouth See admin instructions. TAKE 2 TABLETS (20 MG) BY MOUTH ONCE DAILY AS DIRECTED  02/25/20  Yes Tanda Rockers, MD  PROAIR HFA 108 (90 Base) MCG/ACT inhaler INHALE 2 PUFFS BY MOUTH EVERY 6 HOURS AS NEEDED FOR WHEEZING AND OR SHORTNESS OF BREATH Patient taking differently: Inhale 2 puffs into the lungs every 6 (six) hours as needed for wheezing or shortness of breath.  02/29/20  Yes Tanda Rockers, MD  sodium chloride (OCEAN) 0.65 % nasal spray Place 1 spray into the nose 4 (four) times daily as needed for congestion.    Yes [provider]  vitamin B-12 (CYANOCOBALAMIN) 1000 MCG tablet Take 1,000 mcg by mouth daily.   Yes [provider]    Scheduled Meds: Continuous Infusions: . lactated ringers 10 mL/hr at 03/18/20 0921   PRN Meds:.  Allergies as of 02/14/2020 - Review Complete 01/08/2020  Allergen Reaction Noted  . Codeine  10/03/2017  . Sulfa antibiotics Hives 12/27/2012    Family History  Problem Relation Age of Onset  . Heart disease Father   . Breast cancer Mother   . Other Sister   . Brain cancer Brother     Social History   Socioeconomic History  . Marital status: Married    Spouse name: Not on file  . Number of children: Not on file  . Years of education: Not on file  . Highest education level: Not on file  Occupational History  . Occupation: [employer]   Tobacco Use  . Smoking status: Former Smoker    Packs/day: 2.00    Years: 50.00    Pack years: 100.00    Types: Cigarettes    Quit date: 03/16/2018    Years since quitting: 2.0  . Smokeless tobacco: Never Used  . Tobacco comment: 1/2 ppd 12/30/16-lmr  Vaping Use  . Vaping Use: Former  Substance and Sexual Activity  . Alcohol use: Yes    Alcohol/week: 0.0 standard drinks    Comment: 2 beers a day "couple beers"  . Drug use: No  . Sexual activity: Not on file  Other Topics Concern  . Not on file  Social History Narrative   Retired Animal nutritionist.   He lives with wife.    Highest level of education:  Trade school   Social Determinants of Health    Financial Resource Strain:   . Difficulty of Paying Living Expenses: Not on file  Food Insecurity:   . Worried About Charity fundraiser in the Last Year: Not on file  . Ran Out of Food in the Last Year: Not on file  Transportation Needs:   . Lack of Transportation (Medical): Not on file  . Lack of Transportation (Non-Medical): Not on file  Physical Activity:   . Days of Exercise per Week: Not on file  . Minutes of Exercise per Session: Not on file  Stress:   . Feeling of Stress : Not on file  Social Connections:   . Frequency of Communication with Friends and Family: Not on file  .  Frequency of Social Gatherings with Friends and Family: Not on file  . Attends Religious Services: Not on file  . Active Member of Clubs or Organizations: Not on file  . Attends Archivist Meetings: Not on file  . Marital Status: Not on file  Intimate Partner Violence:   . Fear of Current or Ex-Partner: Not on file  . Emotionally Abused: Not on file  . Physically Abused: Not on file  . Sexually Abused: Not on file    Review of Systems: All negative except as stated above in HPI.  Physical Exam: Vital signs: Vitals:   03/18/20 0909  BP: (!) 174/71  Pulse: 81  Resp: 15  Temp: 98.1 F (36.7 C)  SpO2: 99%     General:   Alert,  Well-developed, well-nourished, pleasant and cooperative in NAD Lungs: Decreased breath sounds bilaterally, no acute respiratory distress Heart:  Regular rate and rhythm; no murmurs, clicks, rubs,  or gallops. Abdomen: Abdomen mildly distended, nontender, no peritoneal signs.  Bowel sounds present Rectal:  Deferred  GI:  Lab Results: No results for input(s): WBC, HGB, HCT, PLT in the last 72 hours. BMET No results for input(s): NA, K, CL, CO2, GLUCOSE, BUN, CREATININE, CALCIUM in the last 72 hours. LFT No results for input(s): PROT, ALBUMIN, AST, ALT, ALKPHOS, BILITOT, BILIDIR, IBILI in the last 72 hours. PT/INR No results for input(s): LABPROT, INR  in the last 72 hours.   Studies/Results: No results found.  Impression/Plan: -Positive FIT test -History of adenomatous polyp -History of partial sigmoid colon resection for complicated diverticulitis -History of COPD  Recommendations -------------------------- -Proceed with colonoscopy today.  Risks (bleeding, infection, bowel perforation that could require surgery, sedation-related changes in cardiopulmonary systems), benefits (identification and possible treatment of source of symptoms, exclusion of certain causes of symptoms), and alternatives (watchful waiting, radiographic imaging studies, empiric medical treatment)  were explained to patient in detail and patient wishes to proceed.    LOS: 0 days   Otis Brace  MD, FACP 03/18/2020, 9:31 AM  Contact #  [phone]

## 2020-03-18 NOTE — Op Note (Signed)
[facility] Patient Name: Joel Perez Procedure Date: 03/18/2020 MRN: [id number] Attending MD: Otis Brace , MD Date of Birth: 18-Dec-1945 CSN: [id number] Age: 74 Admit Type: Inpatient Procedure:                Colonoscopy Indications:              Surveillance: Personal history of adenomatous                            polyps on last colonoscopy > 5 years ago Providers:                Otis Brace, MD, Particia Nearing, RN, Wynonia Sours, RN, Ladona Ridgel, Technician Referring MD:              Medicines:                Sedation Administered by an Anesthesia Professional Complications:            No immediate complications. Estimated Blood Loss:     Estimated blood loss: none. Procedure:                Pre-Anesthesia Assessment:                           - Prior to the procedure, a History and Physical                            was performed, and patient medications and                            allergies were reviewed. The patient's tolerance of                            previous anesthesia was also reviewed. The risks                            and benefits of the procedure and the sedation                            options and risks were discussed with the patient.                            All questions were answered, and informed consent                            was obtained. Prior Anticoagulants: The patient has                            taken no previous anticoagulant or antiplatelet                            agents. ASA Grade Assessment: III - A patient with  severe systemic disease. After reviewing the risks                            and benefits, the patient was deemed in                            satisfactory condition to undergo the procedure.                           After obtaining informed consent, the colonoscope                            was passed under direct vision. Throughout  the                            procedure, the patient's blood pressure, pulse, and                            oxygen saturations were monitored continuously. The                            [id number]-H190DL ([id number]) Olympus pediatric colonscope                            was introduced through the anus and advanced to the                            the cecum, identified by appendiceal orifice and                            ileocecal valve. The colonoscopy was performed                            without difficulty. The patient tolerated the                            procedure well. The quality of the bowel                            preparation was adequate to identify polyps 6 mm                            and larger in size. Scope In: 9:59:57 AM Scope Out: 10:27:17 AM Scope Withdrawal Time: 0 hours 20 minutes 42 seconds  Total Procedure Duration: 0 hours 27 minutes 20 seconds  Findings:      Skin tags were found on perianal exam.      Two sessile polyps were found in the transverse colon. The polyps were 6       to 8 mm in size. These polyps were removed with a cold snare. Resection       and retrieval were complete.      A 8 mm polyp was found in the descending colon. The polyp was sessile.       The polyp was removed with a cold snare. Resection and  retrieval were       complete.      Four sessile polyps were found in the sigmoid colon. The polyps were 4       to 6 mm in size. These polyps were removed with a cold snare. Resection       and retrieval were complete.      There was evidence of a prior end-to-end colo-colonic anastomosis in the       recto-sigmoid colon, at 22 cm. This was patent and was characterized by       healthy appearing mucosa. The anastomosis was traversed.      Internal hemorrhoids were found during retroflexion. The hemorrhoids       were small. Impression:               - Perianal skin tags found on perianal exam.                           - Two 6 to 8 mm  polyps in the transverse colon,                            removed with a cold snare. Resected and retrieved.                           - One 8 mm polyp in the descending colon, removed                            with a cold snare. Resected and retrieved.                           - Four 4 to 6 mm polyps in the sigmoid colon,                            removed with a cold snare. Resected and retrieved.                           - Patent end-to-end colo-colonic anastomosis,                            characterized by healthy appearing mucosa.                           - Internal hemorrhoids. Moderate Sedation:      Moderate (conscious) sedation was personally administered by an       anesthesia professional. The following parameters were monitored: oxygen       saturation, heart rate, blood pressure, and response to care. Recommendation:           - Patient has a contact number available for                            emergencies. The signs and symptoms of potential                            delayed complications were discussed with the  patient. Return to normal activities tomorrow.                            Written discharge instructions were provided to the                            patient.                           - Resume previous diet.                           - Continue present medications.                           - Await pathology results.                           - Repeat colonoscopy in 3 years for surveillance of                            multiple polyps.                           - Return to my office as previously scheduled. Procedure Code(s):        --- Professional ---                           [phone], Colonoscopy, flexible; with removal of                            tumor(s), polyp(s), or other lesion(s) by snare                            technique Diagnosis Code(s):        --- Professional ---                           K63.5, Polyp of  colon                           Z86.010, Personal history of colonic polyps                           Z98.0, Intestinal bypass and anastomosis status                           K64.4, Residual hemorrhoidal skin tags                           K64.8, Other hemorrhoids CPT copyright 2019 American Medical Association. All rights reserved. The codes documented in this report are preliminary and upon coder review may  be revised to meet current compliance requirements. Otis Brace, MD Otis Brace, MD 03/18/2020 10:36:27 AM Number of Addenda: 0

## 2020-03-18 NOTE — Discharge Instructions (Signed)

## 2020-03-19 ENCOUNTER — Encounter (HOSPITAL_COMMUNITY): Payer: Self-pay | Admitting: Gastroenterology

## 2020-03-19 LAB — SURGICAL PATHOLOGY

## 2020-03-29 ENCOUNTER — Other Ambulatory Visit: Payer: Self-pay | Admitting: Internal Medicine

## 2020-04-10 ENCOUNTER — Other Ambulatory Visit: Payer: Self-pay | Admitting: Internal Medicine

## 2020-04-10 ENCOUNTER — Other Ambulatory Visit: Payer: Self-pay | Admitting: General Practice

## 2020-04-10 DIAGNOSIS — R600 Localized edema: Secondary | ICD-10-CM

## 2020-05-12 ENCOUNTER — Ambulatory Visit: Payer: Medicare Other | Admitting: Internal Medicine

## 2020-05-13 ENCOUNTER — Other Ambulatory Visit: Payer: Self-pay | Admitting: Cardiovascular Disease

## 2020-05-13 DIAGNOSIS — R600 Localized edema: Secondary | ICD-10-CM

## 2020-05-13 NOTE — Telephone Encounter (Signed)
Refill request

## 2020-05-26 ENCOUNTER — Ambulatory Visit: Payer: Medicare Other | Admitting: Internal Medicine

## 2020-05-28 ENCOUNTER — Other Ambulatory Visit: Payer: Self-pay | Admitting: Internal Medicine

## 2020-05-28 ENCOUNTER — Other Ambulatory Visit: Payer: Self-pay | Admitting: Cardiovascular Disease

## 2020-05-28 DIAGNOSIS — I739 Peripheral vascular disease, unspecified: Secondary | ICD-10-CM

## 2020-06-11 ENCOUNTER — Other Ambulatory Visit: Payer: Self-pay | Admitting: Internal Medicine

## 2020-06-11 DIAGNOSIS — J449 Chronic obstructive pulmonary disease, unspecified: Secondary | ICD-10-CM

## 2020-06-18 ENCOUNTER — Encounter: Payer: Self-pay | Admitting: Internal Medicine

## 2020-06-18 ENCOUNTER — Ambulatory Visit: Payer: Medicare Other | Admitting: Internal Medicine

## 2020-06-18 ENCOUNTER — Other Ambulatory Visit: Payer: Self-pay

## 2020-06-18 ENCOUNTER — Ambulatory Visit (HOSPITAL_COMMUNITY)
Admission: RE | Admit: 2020-06-18 | Discharge: 2020-06-18 | Disposition: A | Discharge status: Home / Self Care | Payer: Medicare Other | Source: Ambulatory Visit | Attending: Internal Medicine | Admitting: Internal Medicine

## 2020-06-18 DIAGNOSIS — J449 Chronic obstructive pulmonary disease, unspecified: Secondary | ICD-10-CM | POA: Diagnosis not present

## 2020-06-18 DIAGNOSIS — J9611 Chronic respiratory failure with hypoxia: Secondary | ICD-10-CM | POA: Diagnosis not present

## 2020-06-18 DIAGNOSIS — R0602 Shortness of breath: Secondary | ICD-10-CM | POA: Diagnosis not present

## 2020-06-18 DIAGNOSIS — J439 Emphysema, unspecified: Secondary | ICD-10-CM | POA: Diagnosis not present

## 2020-06-18 NOTE — Progress Notes (Signed)
Subjective:   Patient ID: Joel Perez, male    DOB: [birth year]   MRN: [id number]    Brief patient profile:  14 yowm quit smoking 02/2018/MM  referred by Dr Moreen Fowler for eval of copd with GOLD II criteria established 02/09/13    History of Present Illness  12/27/2012 1st pulmonary eval/ Drayson Dorko cc onset x 2009 am cough cough/ congestion and progresive doe with white mucus x sev tbsp x sev min better p proaire and maintained on spiriva which he feels doesn't work as well as advair. Doe x one flight steps, dragging the garbage to street stops x 2. He denies cough to me but actually has a rattling with cough maneuver but doesn't bring up much mucus, what he does bring up is mostly in ams and mucoid and some better since quit smoking  rec symbicort 160 Take 2 puffs first thing in am and then another 2 puffs about 12 hours later.  Only use your albuterol (proaire) as a rescue medication to be used if you can't catch your breath by resting or doing a relaxed purse lip breathing pattern. The less you use it, the better it will work when you need it.  As you improve ok to stop spiriva  Please schedule a follow up office visit in 6 weeks, call sooner if needed with pft's    09/22/2015  f/u ov/Laurena Valko re: GOLD III copd/ maint rx = symbicort/ spiriva resp We will be referring you to rehab at [facility]        10/03/2017  f/u ov/Anijah Spohr re:  GOLD III still smoking  / having trouble affording meds Chief Complaint  Patient presents with  . Follow-up    Increased SOB over the past 2 wks. He is using his proair 4-5 x per day and neb once daily on average.  He states he gets SOB walking just short distances. He did try round of pred and this helped some. He has been coughing up some clear sputum.    Dyspnea:  Still doe = MMRC3 = can't walk 100 yards even at a slow pace at a flat grade s stopping due to sob   Cough: clear / thick/ rattling worst in am  Sleep: R side / 2 pillows /no 02  SABA use:  Overusing unless on  prednisone rec Plan A = Automatic =  BEVESP  Take 2 puffs first thing in am and then another 2 puffs about 12 hours later.  Work on maintaining perfect  inhaler technique:   Plan B = Backup Only use your albuterol as a rescue medication Plan C = Crisis - only use your albuterol nebulizer if you first try Plan B and it fails to help > ok to use the nebulizer up to every 4 hours but if start needing it regularly call for immediate appointment Plan D = Deltasone, take x 2 daily until better then 1 daily x 5 days until better and stop  The key is to stop smoking completely before smoking completely stops you!  Please schedule a follow up visit in 3 months but call sooner if needed  with all medications /inhalers/ solutions in hand so we can verify exactly what you are taking. This includes all medications from all doctors and over the counters     07/05/2018  f/u ov/Giliana Vantil re:  GOLD III / maint on symb 160 2bid  Chief Complaint  Patient presents with  . Follow-up    PFT done today. Breathing has  improved some. He uses his albuterol inhaler about once per day. He has not needed neb.   Dyspnea:  Treadmill at 1.5 mph at zero incline x 10 min stopped by back   / some [employer] ok MMRC3 = can't walk 100 yards even at a slow pace at a flat grade s stopping due to sob   Cough: none Sleeping: bed is flat/ 2 big pillows  SABA use: once a day  If has to go out of house for any reason  02: has but not using  rec Plan A = Automatic = symbicort 160 Take 2 puffs first thing in am and then another 2 puffs about 12 hours later.  And add spiriva 2 pffs each am  Work on inhaler technique:  Plan B = Backup Only use your albuterol inhaler as a rescue medication Plan C = Crisis - only use your albuterol nebulizer if you first try Plan B   Place your 0xygen on  when you walk to maintain the saturation above 90% or walk slower      11/14/2018  f/u ov/Humaira Sculley re:  Copd III no better on 20 mg prednisone daily  finished it and this time says no worse off and doesn't think spiriva helping either  Chief Complaint  Patient presents with  . Follow-up    Breathing is about the same. He is using his proair 2-3 x per day and neb 2 x per wk.  Dyspnea:  MMRC3 = can't walk 100 yards even at a slow pace at a flat grade s stopping due to sob   Cough: minimal  Sleeping: on bed / flat/ 2 pillows  SABA use: as above 02: has poc doesn't think it works  rec Try off the spiriva (think of it like high octane fuel) Goal for walking is to keep your 02 sats above 90% when walking    02/13/2019  f/u ov/Jibran Crookshanks re: COPD GOLD III / 02 dep with activity  / doe not worse off spiriva/ has pred for prn use  Chief Complaint  Patient presents with  . Follow-up  Dyspnea:  Doing gxt x 10 min 1.0 mph flat stops due to hip not sob/ sats ? In 80s but not using 02 correctly   Cough: better  Sleeping: on R side/ 2 pillows  SABA use:  02: not using at hs / using 3lpm cooking / not consistently using while walking  rec Continue symbicort 160  Take 2 puffs first thing in am and then another 2 puffs about 12 hours later.  Be sure to wear 02 2.5-3lpm when walking for exercise to keep the saturations consistently above 90% - this will help your heart and circulation and help you burn fat Please schedule a follow up visit in 3 months but call sooner if needed  Add ? Add breztri next ov ? If insurance will cover   05/21/2019  f/u ov/Muaad Boehning re:  Copd  III  No longer on spiriva / pred is plan D Chief Complaint  Patient presents with  . Follow-up    SOB on extertion. Patient denies any coughing.  Dyspnea:  Doing gxt x 15 min s stopping @ grade = flat at 1.1 mph on 3lpm not checking sats  Cough: no cough  Sleeping: on side / 2 pillows  SABA use: neb before activity 02: 3lpm with activity not checking levels / no using at hs / not using it at rest rec Plan A = Automatic = Always=  Breztri  Take 2 puffs first thing in am and then  another 2 puffs about 12 hours later.  Plan B = Backup (to supplement plan A, not to replace it) Only use your albuterol inhaler as a rescue medication  Plan C = Crisis (instead of Plan B but only if Plan B stops working) - only use your albuterol nebulizer if you first try Plan B Plan D = Prednisone 10 mg  Take 2 daily until better then 1 daily x 5 days and stop Make sure you check your oxygen saturations at highest level of activity to be sure it stays over 90% and adjust upward to maintain this level if needed but remember to turn it back to previous settings when you stop (to conserve your supply)     12/07/2019  f/u ov/Micheal Sheen re:  COPD  III/breztri  Plus saba tid never prechallenge/ prednisone 3 daily "on way down" (this was not rec)  Chief Complaint  Patient presents with  . Follow-up  Dyspnea: limited by leg from doing treadmill but walking [place] and stops half way x 20 min loop @ 3lpm sat > 90% lift hip also limiting Cough: better  Sleeping: bed flat/ 2 pillows  SABA use: as above   02: 3lpm  with acitivity only  rec No change in medications  Please schedule a follow up visit in 6  months but call sooner if needed  - [place] office     06/18/2020  f/u ov/[place] office/Biance Moncrief re: GOLD III / 02 24/7  breztri 2bid And pred 10 x 2 daily says neveroff prednisone  X  6 months and gaining wt - still some vaping  Chief Complaint  Patient presents with  . Follow-up    Pt states breathing has been going downhill x 3 wks. He is getting winded walking room to room. He also c/o weakness. Weight is up since the last visit and has some swelling in his legs. He is using his albuterol 5 x per day on average and neb a couple times per wk.   Dyspnea: room to room  > sats 96% on 3lpm  Cough: none  Sleeping: sleeping in recliner x 45 degrees  SABA use: hfa helps so does neb but not rechallenging  02: 3 - 4lpm 24/7  More swelling than usual assoc with sob    No obvious day to day or  daytime variability or assoc excess/ purulent sputum or mucus plugs or hemoptysis or cp or chest tightness, subjective wheeze or overt sinus or hb symptoms.   Sleeping  without nocturnal  or early am exacerbation  of respiratory  c/o's or need for noct saba. Also denies any obvious fluctuation of symptoms with weather or environmental changes or other aggravating or alleviating factors except as outlined above   No unusual exposure hx or h/o childhood pna/ asthma or knowledge of premature birth.  Current Allergies, Complete Past Medical History, Past Surgical History, Family History, and Social History were reviewed in [employer] record.  ROS  The following are not active complaints unless bolded Hoarseness, sore throat, dysphagia, dental problems, itching, sneezing,  nasal congestion or discharge of excess mucus or purulent secretions, ear ache,   fever, chills, sweats, unintended wt loss or wt gain, classically pleuritic or exertional cp,  orthopnea pnd or arm/hand swelling  or leg swelling, presyncope, palpitations, abdominal pain, anorexia, nausea, vomiting, diarrhea  or change in bowel habits or change in bladder habits, change in stools  or change in urine, dysuria, hematuria,  rash, arthralgias, visual complaints, headache, numbness, weakness or ataxia or problems with walking or coordination,  change in mood or  memory.        Current Meds  Medication Sig  . albuterol (PROVENTIL) (2.5 MG/3ML) 0.083% nebulizer solution USE 1 VIAL IN NEBULIZER EVERY 6 HOURS AS NEEDED FOR WHEEZING AND FOR SHORTNESS OF BREATH (Patient taking differently: Take 2.5 mg by nebulization every 6 (six) hours as needed for wheezing or shortness of breath.)  . aspirin EC 81 MG tablet Take 81 mg by mouth daily. Swallow whole.  Marland Kitchen atorvastatin (LIPITOR) 40 MG tablet Take 1 tablet (40 mg total) by mouth daily. NEEDS APPOINTMENT. PLEASE CALL OFFICE TO SCHEDULE APPOINTMENT FOR FURTHER REFILLS  . BREZTRI  AEROSPHERE 160-9-4.8 MCG/ACT AERO INHALE 2 PUFFS INTO LUNGS TWICE DAILY  . furosemide (LASIX) 20 MG tablet Take 1 tablet by mouth once daily  . gabapentin (NEURONTIN) 600 MG tablet Take 600 mg by mouth every 8 (eight) hours.  Marland Kitchen ibuprofen (ADVIL) 200 MG tablet Take 400 mg by mouth every 8 (eight) hours as needed (PAIN).  Marland Kitchen losartan (COZAAR) 50 MG tablet Take 1 tablet (50 mg total) by mouth daily.  . Multiple Vitamin (MULTIVITAMIN WITH MINERALS) TABS tablet Take 1 tablet by mouth daily.  Marland Kitchen oxyCODONE-acetaminophen (PERCOCET) 10-325 MG tablet Take 1 tablet by mouth 2 (two) times daily as needed (back pain.).   Marland Kitchen predniSONE (DELTASONE) 10 MG tablet TAKE 2 TABLETS BY MOUTH ONCE DAILY AS DIRECTED  . PROAIR HFA 108 (90 Base) MCG/ACT inhaler Inhale 2 puffs into the lungs every 6 (six) hours as needed for wheezing or shortness of breath.  . sodium chloride (OCEAN) 0.65 % nasal spray Place 1 spray into the nose 4 (four) times daily as needed for congestion.   . vitamin B-12 (CYANOCOBALAMIN) 1000 MCG tablet Take 1,000 mcg by mouth daily.               Objective:   Physical Exam   06/18/2020    243 12/07/2019     227  05/21/2019   234  02/13/2019     227  01/18/2014       179 > 02/07/2015 190 > 03/21/2015   192 > 06/23/2015  196 >   12/30/2015 198 > 03/31/2016 198 >  07/02/2016  201 > 12/30/2016 201 >  04/01/2017   195 >  07/04/2017  201 > 10/03/2017  197 >   01/03/2018  206 > 04/05/2018    206> 05/23/2018  211 > 07/05/2018  217 > 11/14/2018  225     02/09/13 177 lb (80.287 kg)  12/27/12 173 lb 12.8 oz (78.835 kg)       Vital signs reviewed  06/18/2020  - Note at rest 02 sats  96% on RA   General appearance:    Obese wm "pink puffer"     2HEENT : pt wearing mask not removed for exam due to covid - 19 concerns.    NECK :  without JVD/Nodes/TM/ nl carotid upstrokes bilaterally   LUNGS: no acc muscle use,  Mild barrel  contour chest wall with bilateral  Distant bs s audible wheeze and  without cough on insp or  exp maneuvers  and mild  Hyperresonant  to  percussion bilaterally     CV:  RRR  no s3 or murmur or increase in P2, and 2+ pitting edema both LE's  ABD:  Obese soft and nontender with pos end  insp Hoover's  in the supine position. No bruits or organomegaly appreciated, bowel sounds nl  MS:   Nl gait/  ext warm without deformities, calf tenderness, cyanosis or clubbing No obvious joint restrictions   SKIN: warm and dry without lesions    NEURO:  alert, approp, nl sensorium with  no motor or cerebellar deficits apparent.         CXR PA and Lateral:   06/18/2020 :    I personally reviewed images and   impression as follows:   CM/ copd no acute changes              Assessment & Plan:

## 2020-06-18 NOTE — Assessment & Plan Note (Signed)
Quit smoking 02/2018  - MM phenotype  - 02/09/2013  FEV1 2.14 (68%) ratio 60 p 30% better from B2 and dloc 52 corrects to 69 - 02/07/2015  extensive coaching HFA effectiveness =    90% from a baseline of 75%  - 02/07/2015  Walked RA x 3 laps @ 185 ft each stopped due to  End of study, nl pace, no sob or desat   - PFT's  03/21/2015  FEV1 1.92 (62 % ) ratio 60  p 14 % improvement from saba with DLCO  47 % corrects to 62 % for alv volume   - trial of spiriva respimat 03/21/2015  With goal to be able to walk up inclines > no better 06/23/2015 > try bevespi 2 bid x 3 weeks  -sob   Worse p 3 weeks bevespi  so resumed symb/spirva 06/25/2015  - referred to rehab 09/22/2015 >>> d/c'd p 19 sessions on 01/22/16 due to costs - 03/31/2016 rec pred x 6 days if needed  - 12/30/2016    restart spiriva 2.5 x 2 each am > could not afford it  - 04/01/2017    try bevespi  - 07/04/2017    continue beveslop and pred as Plan D but misunderstood and took daily  - 01/03/2018 daliresp trial in effort to reduced pred dependency > could not afford  - quit smoking again 02/2018  - 05/23/2018  After extensive coaching inhaler device,  effectiveness =    90% from a baseline of 75% > try symb 160 again as seems much better while on prednisone and if worse then add back spiriva  - Alpha one AT screen 05/23/2018   MM level 125  - PFT's  07/05/2018  FEV1 1.29 (43 % ) ratio 0.49  p 36 % improvement from saba p symb 160 x 2  prior to study with DLCO  48 % corrects to 67  % for alv volume   - 07/05/2018    try spiriva 2.5 x 2 puffs each am > no better on it 11/14/2018 so rec trial off  - 11/14/2018   Continue symbicort 160 2bid only plus prednisone as plan D - 05/21/2019  After extensive coaching inhaler device,  effectiveness =    90% so try breztri   Overusing saba and prednisone leading to significant wt gain and fluid retention.    Rec:  Double lasix, check bnp and TSH  The goal with a chronic steroid dependent illness is always arriving at  the lowest effective dose that controls the disease/symptoms and not accepting a set "formula" which is based on statistics or guidelines that don't always take into account patient  variability or the natural hx of the dz in every individual patient, which may well vary over time.  For now therefore I recommend the patient maintain  Ceiling of 20 mg and floor of 10 mg daily   Re saba: I spent extra time with pt today reviewing appropriate use of albuterol for prn use on exertion with the following points: 1) saba is for relief of sob that does not improve by walking a slower pace or resting but rather if the pt does not improve after trying this first. 2) If the pt is convinced, as many are, that saba helps recover from activity faster then it's easy to tell if this is the case by re-challenging : ie stop, take the inhaler, then p 5 minutes try the exact same activity (intensity of workload) that just caused the  symptoms and see if they are substantially diminished or not after saba 3) if there is an activity that reproducibly causes the symptoms, try the saba 15 min before the activity on alternate days   If in fact the saba really does help, then fine to continue to use it prn but advised may need to look closer at the maintenance regimen being used to achieve better control of airways disease with exertion.

## 2020-06-18 NOTE — Patient Instructions (Addendum)
Prednisone 10 mg x  2 daily until better then one daily   Lasix 20 mg x 2 daily untl swelling better then daily   Please remember to go to the  x-ray department at [facility]   for your tests - we will call you with the results when they are available.  When see [facility] you need CMET TSH  BNP and CBC with diff   Please schedule a follow up office visit in 4 weeks, sooner if needed

## 2020-06-18 NOTE — Assessment & Plan Note (Signed)
Started on 02 with amb 2019 05/23/2018   Walked RA x one lap = 210 ft - stopped due to  desats to 88% then walked second lap on 3lpm POC and sats 89% at end , slow pace  - 11/14/2018 Patient Saturations on Room Air at Rest = 96  %   while Ambulating = 88%   Then  2  Liters of oxygen while Ambulating = 94% - 02/13/2019   Walked RA x one lap =  approx 250 ft - stopped due to  desats to 88% placed on 2.5 lpm and then completed second lap s desats  Advised again:Make sure you check your oxygen saturation  at your highest level of activity  to be sure it stays over 90% and adjust  02 flow upward to maintain this level if needed but remember to turn it back to previous settings when you stop (to conserve your supply).          Each maintenance medication was reviewed in detail including emphasizing most importantly the difference between maintenance and prns and under what circumstances the prns are to be triggered using an action plan format where appropriate.  Total time for H and P, chart review, counseling, reviewing device(s) and generating customized AVS unique to this office visit / same day charting = 25 min

## 2020-06-19 ENCOUNTER — Other Ambulatory Visit: Payer: Self-pay | Admitting: Internal Medicine

## 2020-06-19 DIAGNOSIS — R7309 Other abnormal glucose: Secondary | ICD-10-CM | POA: Diagnosis not present

## 2020-06-19 DIAGNOSIS — Z1389 Encounter for screening for other disorder: Secondary | ICD-10-CM | POA: Diagnosis not present

## 2020-06-19 DIAGNOSIS — J449 Chronic obstructive pulmonary disease, unspecified: Secondary | ICD-10-CM | POA: Diagnosis not present

## 2020-06-19 DIAGNOSIS — J9611 Chronic respiratory failure with hypoxia: Secondary | ICD-10-CM | POA: Diagnosis not present

## 2020-06-19 DIAGNOSIS — R609 Edema, unspecified: Secondary | ICD-10-CM | POA: Diagnosis not present

## 2020-06-19 DIAGNOSIS — Z0000 Encounter for general adult medical examination without abnormal findings: Secondary | ICD-10-CM | POA: Diagnosis not present

## 2020-06-19 DIAGNOSIS — R7303 Prediabetes: Secondary | ICD-10-CM | POA: Diagnosis not present

## 2020-06-19 DIAGNOSIS — E782 Mixed hyperlipidemia: Secondary | ICD-10-CM | POA: Diagnosis not present

## 2020-06-19 DIAGNOSIS — I10 Essential (primary) hypertension: Secondary | ICD-10-CM | POA: Diagnosis not present

## 2020-06-19 NOTE — Progress Notes (Signed)
Spoke with pt and notified of results per Dr. [name]. Pt verbalized understanding and denied any questions. Repeat cxr ordered and he is aware to go to [facility] 30 min prior to [facility] 07/17/19

## 2020-06-24 ENCOUNTER — Other Ambulatory Visit: Payer: Self-pay | Admitting: Cardiovascular Disease

## 2020-06-24 DIAGNOSIS — R600 Localized edema: Secondary | ICD-10-CM

## 2020-06-24 NOTE — Telephone Encounter (Signed)
Refill request

## 2020-07-07 ENCOUNTER — Other Ambulatory Visit: Payer: Self-pay | Admitting: Internal Medicine

## 2020-07-15 ENCOUNTER — Other Ambulatory Visit: Payer: Self-pay | Admitting: Cardiovascular Disease

## 2020-07-15 DIAGNOSIS — I739 Peripheral vascular disease, unspecified: Secondary | ICD-10-CM

## 2020-07-15 NOTE — Telephone Encounter (Signed)
Refill Request.  

## 2020-07-16 ENCOUNTER — Ambulatory Visit: Payer: Medicare Other | Admitting: Internal Medicine

## 2020-07-20 ENCOUNTER — Other Ambulatory Visit: Payer: Self-pay | Admitting: Internal Medicine

## 2020-07-22 ENCOUNTER — Inpatient Hospital Stay (HOSPITAL_COMMUNITY): Payer: Medicare Other

## 2020-07-22 ENCOUNTER — Inpatient Hospital Stay (HOSPITAL_COMMUNITY)
Admission: EM | Admit: 2020-07-22 | Discharge: 2020-07-26 | DRG: 871 | Disposition: A | Discharge status: Home Health | Payer: Medicare Other | Attending: Internal Medicine | Admitting: Internal Medicine

## 2020-07-22 ENCOUNTER — Emergency Department (HOSPITAL_COMMUNITY): Payer: Medicare Other

## 2020-07-22 ENCOUNTER — Encounter (HOSPITAL_COMMUNITY): Payer: Self-pay | Admitting: Emergency Medicine

## 2020-07-22 ENCOUNTER — Other Ambulatory Visit: Payer: Self-pay

## 2020-07-22 DIAGNOSIS — Z6836 Body mass index (BMI) 36.0-36.9, adult: Secondary | ICD-10-CM | POA: Diagnosis not present

## 2020-07-22 DIAGNOSIS — R7989 Other specified abnormal findings of blood chemistry: Secondary | ICD-10-CM

## 2020-07-22 DIAGNOSIS — J440 Chronic obstructive pulmonary disease with acute lower respiratory infection: Secondary | ICD-10-CM | POA: Diagnosis present

## 2020-07-22 DIAGNOSIS — Z20822 Contact with and (suspected) exposure to covid-19: Secondary | ICD-10-CM | POA: Diagnosis not present

## 2020-07-22 DIAGNOSIS — M7989 Other specified soft tissue disorders: Secondary | ICD-10-CM | POA: Diagnosis present

## 2020-07-22 DIAGNOSIS — H5461 Unqualified visual loss, right eye, normal vision left eye: Secondary | ICD-10-CM | POA: Diagnosis not present

## 2020-07-22 DIAGNOSIS — Z808 Family history of malignant neoplasm of other organs or systems: Secondary | ICD-10-CM

## 2020-07-22 DIAGNOSIS — R0602 Shortness of breath: Secondary | ICD-10-CM | POA: Diagnosis not present

## 2020-07-22 DIAGNOSIS — Z8249 Family history of ischemic heart disease and other diseases of the circulatory system: Secondary | ICD-10-CM | POA: Diagnosis not present

## 2020-07-22 DIAGNOSIS — J189 Pneumonia, unspecified organism: Secondary | ICD-10-CM | POA: Diagnosis present

## 2020-07-22 DIAGNOSIS — Z882 Allergy status to sulfonamides status: Secondary | ICD-10-CM

## 2020-07-22 DIAGNOSIS — R0600 Dyspnea, unspecified: Secondary | ICD-10-CM | POA: Diagnosis not present

## 2020-07-22 DIAGNOSIS — L8995 Pressure ulcer of unspecified site, unstageable: Secondary | ICD-10-CM | POA: Diagnosis not present

## 2020-07-22 DIAGNOSIS — J9621 Acute and chronic respiratory failure with hypoxia: Secondary | ICD-10-CM | POA: Diagnosis not present

## 2020-07-22 DIAGNOSIS — I447 Left bundle-branch block, unspecified: Secondary | ICD-10-CM | POA: Diagnosis not present

## 2020-07-22 DIAGNOSIS — I499 Cardiac arrhythmia, unspecified: Secondary | ICD-10-CM | POA: Diagnosis not present

## 2020-07-22 DIAGNOSIS — J441 Chronic obstructive pulmonary disease with (acute) exacerbation: Secondary | ICD-10-CM | POA: Diagnosis not present

## 2020-07-22 DIAGNOSIS — I10 Essential (primary) hypertension: Secondary | ICD-10-CM | POA: Diagnosis not present

## 2020-07-22 DIAGNOSIS — E872 Acidosis: Secondary | ICD-10-CM | POA: Diagnosis not present

## 2020-07-22 DIAGNOSIS — Z7951 Long term (current) use of inhaled steroids: Secondary | ICD-10-CM

## 2020-07-22 DIAGNOSIS — I110 Hypertensive heart disease with heart failure: Secondary | ICD-10-CM | POA: Diagnosis present

## 2020-07-22 DIAGNOSIS — Z79899 Other long term (current) drug therapy: Secondary | ICD-10-CM

## 2020-07-22 DIAGNOSIS — J690 Pneumonitis due to inhalation of food and vomit: Secondary | ICD-10-CM | POA: Diagnosis not present

## 2020-07-22 DIAGNOSIS — J449 Chronic obstructive pulmonary disease, unspecified: Secondary | ICD-10-CM | POA: Diagnosis present

## 2020-07-22 DIAGNOSIS — E669 Obesity, unspecified: Secondary | ICD-10-CM | POA: Diagnosis present

## 2020-07-22 DIAGNOSIS — L309 Dermatitis, unspecified: Secondary | ICD-10-CM | POA: Diagnosis present

## 2020-07-22 DIAGNOSIS — J154 Pneumonia due to other streptococci: Secondary | ICD-10-CM | POA: Diagnosis present

## 2020-07-22 DIAGNOSIS — Z743 Need for continuous supervision: Secondary | ICD-10-CM | POA: Diagnosis not present

## 2020-07-22 DIAGNOSIS — I5032 Chronic diastolic (congestive) heart failure: Secondary | ICD-10-CM | POA: Diagnosis present

## 2020-07-22 DIAGNOSIS — Z9981 Dependence on supplemental oxygen: Secondary | ICD-10-CM

## 2020-07-22 DIAGNOSIS — L89613 Pressure ulcer of right heel, stage 3: Secondary | ICD-10-CM | POA: Diagnosis present

## 2020-07-22 DIAGNOSIS — I248 Other forms of acute ischemic heart disease: Secondary | ICD-10-CM | POA: Diagnosis not present

## 2020-07-22 DIAGNOSIS — R6520 Severe sepsis without septic shock: Secondary | ICD-10-CM | POA: Diagnosis present

## 2020-07-22 DIAGNOSIS — I350 Nonrheumatic aortic (valve) stenosis: Secondary | ICD-10-CM

## 2020-07-22 DIAGNOSIS — R6889 Other general symptoms and signs: Secondary | ICD-10-CM | POA: Diagnosis not present

## 2020-07-22 DIAGNOSIS — G621 Alcoholic polyneuropathy: Secondary | ICD-10-CM | POA: Diagnosis present

## 2020-07-22 DIAGNOSIS — R000 Tachycardia, unspecified: Secondary | ICD-10-CM | POA: Diagnosis not present

## 2020-07-22 DIAGNOSIS — G629 Polyneuropathy, unspecified: Secondary | ICD-10-CM | POA: Diagnosis present

## 2020-07-22 DIAGNOSIS — J984 Other disorders of lung: Secondary | ICD-10-CM | POA: Diagnosis not present

## 2020-07-22 DIAGNOSIS — Z7982 Long term (current) use of aspirin: Secondary | ICD-10-CM

## 2020-07-22 DIAGNOSIS — Z885 Allergy status to narcotic agent status: Secondary | ICD-10-CM

## 2020-07-22 DIAGNOSIS — I739 Peripheral vascular disease, unspecified: Secondary | ICD-10-CM | POA: Diagnosis present

## 2020-07-22 DIAGNOSIS — J439 Emphysema, unspecified: Secondary | ICD-10-CM | POA: Diagnosis not present

## 2020-07-22 DIAGNOSIS — I872 Venous insufficiency (chronic) (peripheral): Secondary | ICD-10-CM | POA: Diagnosis not present

## 2020-07-22 DIAGNOSIS — A419 Sepsis, unspecified organism: Secondary | ICD-10-CM | POA: Diagnosis present

## 2020-07-22 DIAGNOSIS — Z803 Family history of malignant neoplasm of breast: Secondary | ICD-10-CM

## 2020-07-22 DIAGNOSIS — Z87891 Personal history of nicotine dependence: Secondary | ICD-10-CM

## 2020-07-22 DIAGNOSIS — R069 Unspecified abnormalities of breathing: Secondary | ICD-10-CM | POA: Diagnosis not present

## 2020-07-22 DIAGNOSIS — L8990 Pressure ulcer of unspecified site, unspecified stage: Secondary | ICD-10-CM | POA: Insufficient documentation

## 2020-07-22 DIAGNOSIS — I2510 Atherosclerotic heart disease of native coronary artery without angina pectoris: Secondary | ICD-10-CM | POA: Diagnosis not present

## 2020-07-22 LAB — BLOOD GAS, ARTERIAL
Acid-Base Excess: 2.8 mmol/L — ABNORMAL HIGH (ref 0.0–2.0)
Bicarbonate: 27 mmol/L (ref 20.0–28.0)
FIO2: 36
O2 Saturation: 95.9 %
Patient temperature: 37
pCO2 arterial: 38.8 mmHg (ref 32.0–48.0)
pH, Arterial: 7.45 (ref 7.350–7.450)
pO2, Arterial: 69 mmHg — ABNORMAL LOW (ref 83.0–108.0)

## 2020-07-22 LAB — COMPREHENSIVE METABOLIC PANEL
ALT: 28 U/L (ref 0–44)
AST: 30 U/L (ref 15–41)
Albumin: 3.5 g/dL (ref 3.5–5.0)
Alkaline Phosphatase: 51 U/L (ref 38–126)
Anion gap: 12 (ref 5–15)
BUN: 17 mg/dL (ref 8–23)
CO2: 27 mmol/L (ref 22–32)
Calcium: 8.7 mg/dL — ABNORMAL LOW (ref 8.9–10.3)
Chloride: 99 mmol/L (ref 98–111)
Creatinine, Ser: 1 mg/dL (ref 0.61–1.24)
GFR, Estimated: >60 mL/min (ref >60)
Glucose, Bld: 109 mg/dL — ABNORMAL HIGH (ref 70–99)
Potassium: 4 mmol/L (ref 3.5–5.1)
Sodium: 138 mmol/L (ref 135–145)
Total Bilirubin: 0.6 mg/dL (ref 0.3–1.2)
Total Protein: 7.1 g/dL (ref 6.5–8.1)

## 2020-07-22 LAB — CBC WITH DIFFERENTIAL/PLATELET
Abs Immature Granulocytes: 0.25 10*3/uL — ABNORMAL HIGH (ref 0.00–0.07)
Basophils Absolute: 0.1 10*3/uL (ref 0.0–0.1)
Basophils Relative: 0 %
Eosinophils Absolute: 0.1 10*3/uL (ref 0.0–0.5)
Eosinophils Relative: 0 %
HCT: 38.8 % — ABNORMAL LOW (ref 39.0–52.0)
Hemoglobin: 12.3 g/dL — ABNORMAL LOW (ref 13.0–17.0)
Immature Granulocytes: 1 %
Lymphocytes Relative: 12 %
Lymphs Abs: 2.1 10*3/uL (ref 0.7–4.0)
MCH: 32.2 pg (ref 26.0–34.0)
MCHC: 31.7 g/dL (ref 30.0–36.0)
MCV: 101.6 fL — ABNORMAL HIGH (ref 80.0–100.0)
Monocytes Absolute: 1.3 10*3/uL — ABNORMAL HIGH (ref 0.1–1.0)
Monocytes Relative: 7 %
Neutro Abs: 13.9 10*3/uL — ABNORMAL HIGH (ref 1.7–7.7)
Neutrophils Relative %: 80 %
Platelets: 358 10*3/uL (ref 150–400)
RBC: 3.82 MIL/uL — ABNORMAL LOW (ref 4.22–5.81)
RDW: 14.1 % (ref 11.5–15.5)
WBC: 17.7 10*3/uL — ABNORMAL HIGH (ref 4.0–10.5)
nRBC: 0 % (ref 0.0–0.2)

## 2020-07-22 LAB — BRAIN NATRIURETIC PEPTIDE: B Natriuretic Peptide: 107 pg/mL — ABNORMAL HIGH (ref 0.0–100.0)

## 2020-07-22 LAB — ECHOCARDIOGRAM COMPLETE
AR max vel: 1.85 cm2
AV Area VTI: 1.6 cm2
AV Area mean vel: 1.85 cm2
AV Mean grad: 12.5 mmHg
AV Peak grad: 22.7 mmHg
Ao pk vel: 2.38 m/s
Area-P 1/2: 4.65 cm2
Height: 70 in
S' Lateral: 3.22 cm
Weight: 3904 oz

## 2020-07-22 LAB — PROCALCITONIN: Procalcitonin: 0.23 ng/mL

## 2020-07-22 LAB — TROPONIN I (HIGH SENSITIVITY)
Troponin I (High Sensitivity): 63 ng/L — ABNORMAL HIGH (ref <18)
Troponin I (High Sensitivity): 75 ng/L — ABNORMAL HIGH (ref <18)
Troponin I (High Sensitivity): 66 ng/L — ABNORMAL HIGH (ref <18)
Troponin I (High Sensitivity): 38 ng/L — ABNORMAL HIGH (ref <18)

## 2020-07-22 LAB — LACTIC ACID, PLASMA
Lactic Acid, Venous: 3.3 mmol/L (ref 0.5–1.9)
Lactic Acid, Venous: 3 mmol/L (ref 0.5–1.9)

## 2020-07-22 LAB — POC SARS CORONAVIRUS 2 AG -  ED: SARS Coronavirus 2 Ag: NEGATIVE

## 2020-07-22 LAB — RESP PANEL BY RT-PCR (FLU A&B, COVID) ARPGX2
Influenza A by PCR: NEGATIVE
Influenza B by PCR: NEGATIVE
SARS Coronavirus 2 by RT PCR: NEGATIVE

## 2020-07-22 MED ORDER — OXYCODONE-ACETAMINOPHEN 5-325 MG PO TABS
1.0000 | ORAL_TABLET | Freq: Two times a day (BID) | ORAL | Status: DC | PRN
  Administered 2020-07-22: 1 via ORAL
  Filled 2020-07-22: qty 1

## 2020-07-22 MED ORDER — MAGNESIUM SULFATE 2 GM/50ML IV SOLN
2.0000 g | Freq: Once | INTRAVENOUS | Status: AC
  Administered 2020-07-22: 2 g via INTRAVENOUS
  Filled 2020-07-22: qty 50

## 2020-07-22 MED ORDER — SODIUM CHLORIDE 0.9% FLUSH: 3.0000 mL | INTRAVENOUS | Status: DC | PRN

## 2020-07-22 MED ORDER — IPRATROPIUM-ALBUTEROL 0.5-2.5 (3) MG/3ML IN SOLN: 3.0000 mL | Freq: Four times a day (QID) | RESPIRATORY_TRACT | Status: DC

## 2020-07-22 MED ORDER — BUDESONIDE 0.5 MG/2ML IN SUSP: 2.0000 mg | Freq: Four times a day (QID) | RESPIRATORY_TRACT | Status: DC

## 2020-07-22 MED ORDER — ASPIRIN EC 81 MG PO TBEC
81.0000 mg | DELAYED_RELEASE_TABLET | Freq: Every day | ORAL | Status: DC
  Administered 2020-07-22 – 2020-07-26 (×5): 81 mg via ORAL
  Filled 2020-07-22 (×5): qty 1

## 2020-07-22 MED ORDER — SODIUM CHLORIDE 0.9 % IV SOLN
500.0000 mg | INTRAVENOUS | Status: DC
  Administered 2020-07-23: 500 mg via INTRAVENOUS
  Filled 2020-07-22: qty 500

## 2020-07-22 MED ORDER — ENOXAPARIN SODIUM 60 MG/0.6ML ~~LOC~~ SOLN
55.0000 mg | SUBCUTANEOUS | Status: DC
  Administered 2020-07-22 – 2020-07-25 (×4): 55 mg via SUBCUTANEOUS
  Filled 2020-07-22 (×4): qty 0.6

## 2020-07-22 MED ORDER — ACETAMINOPHEN 325 MG PO TABS: 650.0000 mg | ORAL_TABLET | Freq: Four times a day (QID) | ORAL | Status: DC | PRN

## 2020-07-22 MED ORDER — IPRATROPIUM-ALBUTEROL 0.5-2.5 (3) MG/3ML IN SOLN
3.0000 mL | Freq: Four times a day (QID) | RESPIRATORY_TRACT | Status: DC
  Administered 2020-07-22: 3 mL via RESPIRATORY_TRACT
  Filled 2020-07-22: qty 3

## 2020-07-22 MED ORDER — CHLORHEXIDINE GLUCONATE CLOTH 2 % EX PADS
6.0000 | MEDICATED_PAD | Freq: Every day | CUTANEOUS | Status: DC
  Administered 2020-07-22 – 2020-07-26 (×5): 6 via TOPICAL

## 2020-07-22 MED ORDER — ONDANSETRON HCL 4 MG/2ML IJ SOLN: 4.0000 mg | Freq: Four times a day (QID) | INTRAMUSCULAR | Status: DC | PRN

## 2020-07-22 MED ORDER — BUDESONIDE 0.5 MG/2ML IN SUSP
0.5000 mg | Freq: Two times a day (BID) | RESPIRATORY_TRACT | Status: DC
  Administered 2020-07-22 – 2020-07-26 (×8): 0.5 mg via RESPIRATORY_TRACT
  Filled 2020-07-22 (×9): qty 2

## 2020-07-22 MED ORDER — ADULT MULTIVITAMIN W/MINERALS CH
1.0000 | ORAL_TABLET | Freq: Every day | ORAL | Status: DC
  Administered 2020-07-22 – 2020-07-26 (×5): 1 via ORAL
  Filled 2020-07-22 (×5): qty 1

## 2020-07-22 MED ORDER — ONDANSETRON HCL 4 MG PO TABS: 4.0000 mg | ORAL_TABLET | Freq: Four times a day (QID) | ORAL | Status: DC | PRN

## 2020-07-22 MED ORDER — SALINE SPRAY 0.65 % NA SOLN
1.0000 | Freq: Four times a day (QID) | NASAL | Status: DC | PRN
  Filled 2020-07-22: qty 44

## 2020-07-22 MED ORDER — OXYCODONE-ACETAMINOPHEN 10-325 MG PO TABS: 1.0000 | ORAL_TABLET | Freq: Two times a day (BID) | ORAL | Status: DC | PRN

## 2020-07-22 MED ORDER — GABAPENTIN 300 MG PO CAPS
600.0000 mg | ORAL_CAPSULE | Freq: Three times a day (TID) | ORAL | Status: DC
  Administered 2020-07-22 – 2020-07-26 (×12): 600 mg via ORAL
  Filled 2020-07-22 (×13): qty 2

## 2020-07-22 MED ORDER — METHYLPREDNISOLONE SODIUM SUCC 40 MG IJ SOLR
40.0000 mg | Freq: Two times a day (BID) | INTRAMUSCULAR | Status: AC
Start: 2020-07-22 — End: 2020-07-23
  Administered 2020-07-22 – 2020-07-23 (×2): 40 mg via INTRAVENOUS
  Filled 2020-07-22 (×2): qty 1

## 2020-07-22 MED ORDER — LACTATED RINGERS IV SOLN: INTRAVENOUS | Status: AC

## 2020-07-22 MED ORDER — LACTATED RINGERS IV BOLUS
30.0000 mL/kg | Freq: Once | INTRAVENOUS | Status: AC
  Administered 2020-07-22: 2190 mL via INTRAVENOUS

## 2020-07-22 MED ORDER — IOHEXOL 300 MG/ML  SOLN
75.0000 mL | Freq: Once | INTRAMUSCULAR | Status: AC | PRN
  Administered 2020-07-22: 75 mL via INTRAVENOUS

## 2020-07-22 MED ORDER — ATORVASTATIN CALCIUM 40 MG PO TABS
40.0000 mg | ORAL_TABLET | Freq: Every day | ORAL | Status: DC
  Administered 2020-07-22 – 2020-07-26 (×5): 40 mg via ORAL
  Filled 2020-07-22 (×5): qty 1

## 2020-07-22 MED ORDER — PREDNISONE 20 MG PO TABS
40.0000 mg | ORAL_TABLET | Freq: Every day | ORAL | Status: AC
  Administered 2020-07-23 – 2020-07-26 (×4): 40 mg via ORAL
  Filled 2020-07-22 (×4): qty 2

## 2020-07-22 MED ORDER — VITAMIN B-12 1000 MCG PO TABS
1000.0000 ug | ORAL_TABLET | Freq: Every day | ORAL | Status: DC
  Administered 2020-07-22 – 2020-07-26 (×5): 1000 ug via ORAL
  Filled 2020-07-22 (×5): qty 1

## 2020-07-22 MED ORDER — SODIUM CHLORIDE 0.9 % IV SOLN
1.0000 g | INTRAVENOUS | Status: DC
  Administered 2020-07-22 – 2020-07-25 (×4): 1 g via INTRAVENOUS
  Filled 2020-07-22 (×4): qty 10

## 2020-07-22 MED ORDER — ACETAMINOPHEN 650 MG RE SUPP: 650.0000 mg | Freq: Four times a day (QID) | RECTAL | Status: DC | PRN

## 2020-07-22 MED ORDER — SODIUM CHLORIDE 0.9% FLUSH
3.0000 mL | Freq: Two times a day (BID) | INTRAVENOUS | Status: DC
  Administered 2020-07-22 – 2020-07-25 (×8): 3 mL via INTRAVENOUS

## 2020-07-22 MED ORDER — OXYCODONE HCL 5 MG PO TABS
5.0000 mg | ORAL_TABLET | Freq: Two times a day (BID) | ORAL | Status: DC | PRN
  Administered 2020-07-22: 5 mg via ORAL
  Filled 2020-07-22: qty 1

## 2020-07-22 MED ORDER — LEVOFLOXACIN IN D5W 500 MG/100ML IV SOLN
500.0000 mg | Freq: Once | INTRAVENOUS | Status: AC
  Administered 2020-07-22: 500 mg via INTRAVENOUS
  Filled 2020-07-22: qty 100

## 2020-07-22 MED ORDER — SODIUM CHLORIDE 0.9 % IV SOLN: 250.0000 mL | INTRAVENOUS | Status: DC | PRN

## 2020-07-22 NOTE — H&P (Signed)
History and Physical    Joel Perez GYF:[id number] DOB: 1946-03-07 DOA: 07/22/2020  PCP: [name], MD   Patient coming from: Home  Chief Complaint: Dyspnea  HPI: Joel Perez is a 75 y.o. male with medical history significant for chronic diastolic congestive heart failure, IVCD with LBBB, chronic venous insufficiency with stasis dermatitis, PAD, neuropathy, and COPD with chronic hypoxemia and steroid use who presented to the ED via EMS with worsening shortness of breath and cough for the last few days.  His cough has been productive of yellow sputum.  His symptoms seem to improve with breathing treatments, no particular aggravating factors are noted.  Usually he wears 4 L nasal cannula at home.  He denies any chest pain, fever, chills, abdominal pain, nausea, or vomiting.  He states that his legs are typically swollen.  He states that he has had his Covid vaccines administered as well.   ED Course: Patient noted to have sinus tachycardia is also pressure readings have been 90 systolic range.  Leukocytosis of over 17,000 noted and chest x-ray suggestive of right lower lung base pneumonia.  Troponins 63 with follow-up troponin 75.  Procalcitonin 0.23 and BNP 107.  He was given some Levaquin and magnesium sulfate.  Unfortunately blood cultures were not collected, nor was a lactic acid collected.  Blood cultures have been collected after Levaquin was given in the ED.  Lactic acid was checked with initial level being 3.3.  Fluid bolus of 30 cc/kg was administered with improvement in blood pressure and heart rate.  Patient currently without significant respiratory distress and not his usual 4 L nasal cannula.  Covid testing and respiratory panel negative.  Review of Systems: Reviewed as noted above, otherwise negative.  Past Medical History:  Diagnosis Date  . 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 BPH (benign prostatic hyperplasia)   . Capsular cataract of left eye   . COPD  (chronic obstructive pulmonary disease) (HCC)    Dr. [name]  . Current smoker    Long-term smoker. Not interested in quitting  . Diverticulosis    With intermittent diverticulitis  . Erectile dysfunction   . Essential hypertension    Several recorded blood pressures greater than 140/90.  . H/O mitral valve prolapse    By report, but not confirmed by echo.  . Low back pain    Chronic. Followed by Dr. Suella Broad  . Mild aortic regurgitation 12/2013   Mild to moderate aortic regurgitation on echo  . Neuropathy     Past Surgical History:  Procedure Laterality Date  . COLON SURGERY  2000  . COLONOSCOPY WITH PROPOFOL N/A 03/18/2020   Procedure: COLONOSCOPY WITH PROPOFOL;  Surgeon: Otis Brace, MD;  Location: [facility];  Service: Gastroenterology;  Laterality: N/A;  . ELBOW SURGERY    . POLYPECTOMY  03/18/2020   Procedure: POLYPECTOMY;  Surgeon: Otis Brace, MD;  Location: [facility];  Service: Gastroenterology;;  . TRANSTHORACIC ECHOCARDIOGRAM  12/29/2016   EF 60-65%. Moderate LVH. Mild aortic valve calcification. Mild to moderate regurgitation with no stenosis. Mitral valve annular calcification with no comment of prolapse or regurgitation.     reports that he quit smoking about 2 years ago. His smoking use included cigarettes. He has a 100.00 pack-year smoking history. He has never used smokeless tobacco. He reports current alcohol use. He reports that he does not use drugs.  Allergies  Allergen Reactions  . Other Swelling and Other (See Comments)  Farmed Fish (tightness in throat & lip swelling)  . Codeine Rash  . Sulfa Antibiotics Hives    Family History  Problem Relation Age of Onset  . Heart disease Father   . Breast cancer Mother   . Other Sister   . Brain cancer Brother     Prior to Admission medications   Medication Sig Start Date End Date Taking? Authorizing Provider  furosemide (LASIX) 20 MG tablet Take 1 tablet by mouth once daily Patient  taking differently: Take 20 mg by mouth daily. 06/25/20   Wellington Hampshire, MD  albuterol (PROVENTIL) (2.5 MG/3ML) 0.083% nebulizer solution USE 1 VIAL IN NEBULIZER EVERY 6 HOURS AS NEEDED FOR WHEEZING AND FOR SHORTNESS OF BREATH Patient taking differently: Take 2.5 mg by nebulization every 6 (six) hours as needed for wheezing or shortness of breath. 01/14/20   Tanda Rockers, MD  aspirin EC 81 MG tablet Take 81 mg by mouth daily. Swallow whole.    [provider]  atorvastatin (LIPITOR) 40 MG tablet TAKE 1 TABLET BY MOUTH ONCE DAILY . APPOINTMENT REQUIRED FOR FUTURE REFILLS Patient taking differently: Take 40 mg by mouth daily. 07/15/20   Wellington Hampshire, MD  BREZTRI AEROSPHERE 160-9-4.8 MCG/ACT AERO INHALE 2 PUFFS INTO LUNGS TWICE DAILY Patient taking differently: Inhale 2 puffs into the lungs in the morning and at bedtime. 06/11/20   Tanda Rockers, MD  gabapentin (NEURONTIN) 600 MG tablet Take 600 mg by mouth every 8 (eight) hours. 02/25/20   [provider]  ibuprofen (ADVIL) 200 MG tablet Take 400 mg by mouth every 8 (eight) hours as needed (PAIN).    [provider]  losartan (COZAAR) 50 MG tablet Take 1 tablet (50 mg total) by mouth daily. 01/08/20   Wellington Hampshire, MD  Multiple Vitamin (MULTIVITAMIN WITH MINERALS) TABS tablet Take 1 tablet by mouth daily.    [provider]  oxyCODONE-acetaminophen (PERCOCET) 10-325 MG tablet Take 1 tablet by mouth 2 (two) times daily as needed (back pain.).     [provider]  predniSONE (DELTASONE) 10 MG tablet TAKE 2 TABLETS BY MOUTH ONCE DAILY AS DIRECTED Patient taking differently: Take 20 mg by mouth daily. 07/21/20   Tanda Rockers, MD  PROAIR HFA 108 [phone] Base) MCG/ACT inhaler INHALE 2 PUFFS BY MOUTH EVERY 6 HOURS AS NEEDED FOR WHEEZING FOR SHORTNESS OF BREATH Patient taking differently: Inhale 2 puffs into the lungs every 6 (six) hours as needed for wheezing or shortness of breath. 07/07/20   Tanda Rockers, MD  sodium chloride (OCEAN) 0.65 % nasal spray Place 1 spray into the nose 4 (four) times daily as needed for congestion.     [provider]  vitamin B-12 (CYANOCOBALAMIN) 1000 MCG tablet Take 1,000 mcg by mouth daily.    [provider]    Physical Exam: Vitals:   07/22/20 1000 07/22/20 1030 07/22/20 1100 07/22/20 1130  BP: (!) 98/57 95/63 (!) 107/53 (!) 101/41  Pulse: (!) 106 (!) 103    Resp: 18 (!) 22 (!) 23   Temp:      TempSrc:      SpO2: 97% 99%    Weight:      Height:        Constitutional: Minimal distress, obese Vitals:   07/22/20 1000 07/22/20 1030 07/22/20 1100 07/22/20 1130  BP: (!) 98/57 95/63 (!) 107/53 (!) 101/41  Pulse: (!) 106 (!) 103    Resp: 18 (!) 22 (!) 23  Temp:      TempSrc:      SpO2: 97% 99%    Weight:      Height:       Eyes: lids and conjunctivae normal Neck: normal, supple Respiratory: Diminished bilaterally. Normal respiratory effort. No accessory muscle use.  Currently on 4 L nasal cannula. Cardiovascular: Regular rate and rhythm, tachycardic, no murmurs. Abdomen: no tenderness, no distention. Bowel sounds positive.  Musculoskeletal: 2+ pitting edema bilateral lower extremities Skin: Stasis dermatitis changes to bilateral lower extremities Psychiatric: Flat affect  Labs on Admission: I have personally reviewed following labs and imaging studies  CBC: Recent Labs  Lab 07/22/20 0751  WBC 17.7*  NEUTROABS 13.9*  HGB 12.3*  HCT 38.8*  MCV 101.6*  PLT 944   Basic Metabolic Panel: Recent Labs  Lab 07/22/20 0751  NA 138  K 4.0  CL 99  CO2 27  GLUCOSE 109*  BUN 17  CREATININE 1.00  CALCIUM 8.7*   GFR: Estimated Creatinine Clearance: 78.3 mL/min (by C-G formula based on SCr of 1 mg/dL). Liver Function Tests: Recent Labs  Lab 07/22/20 0751  AST 30  ALT 28  ALKPHOS 51  BILITOT 0.6  PROT 7.1  ALBUMIN 3.5   No results for input(s): LIPASE, AMYLASE in the last 168 hours. No results for  input(s): AMMONIA in the last 168 hours. Coagulation Profile: No results for input(s): INR, PROTIME in the last 168 hours. Cardiac Enzymes: No results for input(s): CKTOTAL, CKMB, CKMBINDEX, TROPONINI in the last 168 hours. BNP (last 3 results) No results for input(s): PROBNP in the last 8760 hours. HbA1C: No results for input(s): HGBA1C in the last 72 hours. CBG: No results for input(s): GLUCAP in the last 168 hours. Lipid Profile: No results for input(s): CHOL, HDL, LDLCALC, TRIG, CHOLHDL, LDLDIRECT in the last 72 hours. Thyroid Function Tests: No results for input(s): TSH, T4TOTAL, FREET4, T3FREE, THYROIDAB in the last 72 hours. Anemia Panel: No results for input(s): VITAMINB12, FOLATE, FERRITIN, TIBC, IRON, RETICCTPCT in the last 72 hours. Urine analysis: No results found for: COLORURINE, APPEARANCEUR, [place], [place], GLUCOSEU, HGBUR, BILIRUBINUR, KETONESUR, PROTEINUR, UROBILINOGEN, NITRITE, LEUKOCYTESUR  Radiological Exams on Admission: DG Chest Portable 1 View  Result Date: 07/22/2020 CLINICAL DATA:  Shortness of breath for 2 days Hypertension COPD Former smoker EXAM: PORTABLE CHEST 1 VIEW COMPARISON:  06/18/2020 FINDINGS: Cardiomediastinal silhouette and pulmonary vasculature are within normal limits. New airspace opacity at the right lung base suspicious for pneumonia. Nodular opacity again seen in the right upper lung measuring 1.2 cm. IMPRESSION: 1. New airspace opacity at the right lung base suspicious for pneumonia. 2. 1.2 cm nodule again seen in the right upper lung. Further evaluation with contrast enhanced CT again recommended. These results will be called to the ordering clinician or representative by the Radiologist Assistant, and communication documented in the PACS or [employer]. Electronically Signed   By: Miachel Roux M.D.   On: 07/22/2020 08:16    EKG: Independently reviewed. ST 114bpm with LBBB.  Assessment/Plan Active Problems:   Sepsis ([place])    Severe  sepsis secondary to right lower lobe pneumonia -Noted to have leukocytosis and lactic acidosis, procalcitonin only 0.23 -Continue on azithromycin and Rocephin for now -Blood cultures ordered after Levaquin given in ED -30 cc/kg bolus given in ED -Continue to monitor CBC and lactic acid levels -Covid testing negative, urine Legionella and strep pneumonia ordered as well as sputum culture  Acute COPD exacerbation related to above -Noted to have chronic hypoxemia on  4 L nasal cannula at home -Patient noted to have pulmonary nodule on chest x-ray, obtain CT chest with contrast as recommended -Continue antibiotics as noted above -DuoNebs every 6 hours -IV Solu-Medrol twice daily -Mucolytic's and antitussives  Elevated troponin -Likely related to demand ischemia from sepsis physiology -No chest pain noted -Check 2D echocardiogram, prior 12/2018 with LVEF 60-65%  History of chronic diastolic CHF -Patient given IV fluid bolus in ED, will need careful monitoring -Check 2D echocardiogram as noted above -Holding Lasix for now given soft blood pressure readings and sepsis physiology -Monitor daily weights -Will likely require some diuresis after sepsis physiology has resolved  Chronic venous insufficiency/PUD/neuropathy -Continue aspirin, statin, gabapentin  History of hypertension -Currently with soft blood pressure readings -Hold home losartan and Lasix  History of IVCD/LBBB -Noted on EKG  Obesity -BMI 35.01 kg/m -Lifestyle changes outpatient  DVT prophylaxis: Lovenox Code Status: Full Family Communication: Pt will call Disposition Plan:Admit for treatment of sepsis/pna and copd Consults called:None Admission status: Inpatient, SDU   Pratik D Shah DO [facility]  If 7PM-7AM, please contact night-coverage [email or web address]  07/22/2020, 12:26 PM

## 2020-07-22 NOTE — Progress Notes (Signed)
*  PRELIMINARY RESULTS* Echocardiogram 2D Echocardiogram has been performed.  Leavy Cella 07/22/2020, 2:04 PM

## 2020-07-22 NOTE — ED Triage Notes (Signed)
Pt from home, brought in by [facility] for Shortness of breath x2 days. Pt has history of COPD. Pt normally on 4L oxygen at home, ems was unable to get O2 sat enroute. Pt given 125 solumedrol, 2 albuterol treatments enroute to ED.

## 2020-07-22 NOTE — ED Provider Notes (Signed)
[facility] EMERGENCY DEPARTMENT Provider Note   CSN: [id number] Arrival date & time: 07/22/20  2409     History Chief Complaint  Patient presents with  . Shortness of Breath    ELSWORTH Perez is a 75 y.o. male.  Patient brought in by paramedics because of shortness of breath and cough for a few days.  He was extremely short of breath when the paramedics arrived.  They gave him nebulized treatments along with Solu-Medrol.  Patient is normally on 4 L of nasal oxygen at home.  The history is provided by the patient. No language interpreter was used.  Shortness of Breath Severity:  Moderate Onset quality:  Sudden Timing:  Constant Progression:  Improving Chronicity:  Recurrent Context: activity   Relieved by:  Nothing Worsened by:  Nothing Ineffective treatments:  None tried Associated symptoms: no abdominal pain, no chest pain, no cough, no headaches and no rash        Past Medical History:  Diagnosis Date  . 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 BPH (benign prostatic hyperplasia)   . Capsular cataract of left eye   . COPD (chronic obstructive pulmonary disease) (HCC)    Dr. [name]  . Current smoker    Long-term smoker. Not interested in quitting  . Diverticulosis    With intermittent diverticulitis  . Erectile dysfunction   . Essential hypertension    Several recorded blood pressures greater than 140/90.  . H/O mitral valve prolapse    By report, but not confirmed by echo.  . Low back pain    Chronic. Followed by Dr. Suella Broad  . Mild aortic regurgitation 12/2013   Mild to moderate aortic regurgitation on echo  . Neuropathy     Patient Active Problem List   Diagnosis Date Noted  . Sepsis (Joel Perez) 07/22/2020  . Edema of both legs 12/19/2018  . PVD (peripheral vascular disease) ([place]) 12/19/2018  . DOE (dyspnea on exertion) 11/14/2018  . Chronic respiratory failure with hypoxia ([place]) 04/06/2018  . Decreased pedal pulses 01/17/2017  . Mild  aortic valve regurgitation 01/17/2017  . Leg swelling 12/30/2016  . Neuropathy, alcoholic ([place]) 73/53/2992  . COPD III with marked reversibility 12/27/2012    Past Surgical History:  Procedure Laterality Date  . COLON SURGERY  2000  . COLONOSCOPY WITH PROPOFOL N/A 03/18/2020   Procedure: COLONOSCOPY WITH PROPOFOL;  Surgeon: Otis Brace, MD;  Location: [facility];  Service: Gastroenterology;  Laterality: N/A;  . ELBOW SURGERY    . POLYPECTOMY  03/18/2020   Procedure: POLYPECTOMY;  Surgeon: Otis Brace, MD;  Location: [facility];  Service: Gastroenterology;;  . TRANSTHORACIC ECHOCARDIOGRAM  12/29/2016   EF 60-65%. Moderate LVH. Mild aortic valve calcification. Mild to moderate regurgitation with no stenosis. Mitral valve annular calcification with no comment of prolapse or regurgitation.       Family History  Problem Relation Age of Onset  . Heart disease Father   . Breast cancer Mother   . Other Sister   . Brain cancer Brother     Social History   Tobacco Use  . Smoking status: Former Smoker    Packs/day: 2.00    Years: 50.00    Pack years: 100.00    Types: Cigarettes    Quit date: 03/16/2018    Years since quitting: 2.3  . Smokeless tobacco: Never Used  . Tobacco comment: 1/2 ppd 12/30/16-lmr  Vaping Use  . Vaping Use: Some days  . Start date: 03/16/2018  Substance Use Topics  . Alcohol use: Yes    Alcohol/week: 0.0 standard drinks    Comment: 2 beers a day "couple beers"  . Drug use: No    Home Medications Prior to Admission medications   Medication Sig Start Date End Date Taking? Authorizing Provider  furosemide (LASIX) 20 MG tablet Take 1 tablet by mouth once daily Patient taking differently: Take 20 mg by mouth daily. 06/25/20   Wellington Hampshire, MD  albuterol (PROVENTIL) (2.5 MG/3ML) 0.083% nebulizer solution USE 1 VIAL IN NEBULIZER EVERY 6 HOURS AS NEEDED FOR WHEEZING AND FOR SHORTNESS OF BREATH Patient taking differently: Take 2.5 mg by  nebulization every 6 (six) hours as needed for wheezing or shortness of breath. 01/14/20   Tanda Rockers, MD  aspirin EC 81 MG tablet Take 81 mg by mouth daily. Swallow whole.    [provider]  atorvastatin (LIPITOR) 40 MG tablet TAKE 1 TABLET BY MOUTH ONCE DAILY . APPOINTMENT REQUIRED FOR FUTURE REFILLS Patient taking differently: Take 40 mg by mouth daily. 07/15/20   Wellington Hampshire, MD  BREZTRI AEROSPHERE 160-9-4.8 MCG/ACT AERO INHALE 2 PUFFS INTO LUNGS TWICE DAILY Patient taking differently: Inhale 2 puffs into the lungs in the morning and at bedtime. 06/11/20   Tanda Rockers, MD  gabapentin (NEURONTIN) 600 MG tablet Take 600 mg by mouth every 8 (eight) hours. 02/25/20   [provider]  ibuprofen (ADVIL) 200 MG tablet Take 400 mg by mouth every 8 (eight) hours as needed (PAIN).    [provider]  losartan (COZAAR) 50 MG tablet Take 1 tablet (50 mg total) by mouth daily. 01/08/20   Wellington Hampshire, MD  Multiple Vitamin (MULTIVITAMIN WITH MINERALS) TABS tablet Take 1 tablet by mouth daily.    [provider]  oxyCODONE-acetaminophen (PERCOCET) 10-325 MG tablet Take 1 tablet by mouth 2 (two) times daily as needed (back pain.).     [provider]  predniSONE (DELTASONE) 10 MG tablet TAKE 2 TABLETS BY MOUTH ONCE DAILY AS DIRECTED Patient taking differently: Take 20 mg by mouth daily. 07/21/20   Tanda Rockers, MD  PROAIR HFA 108 [phone] Base) MCG/ACT inhaler INHALE 2 PUFFS BY MOUTH EVERY 6 HOURS AS NEEDED FOR WHEEZING FOR SHORTNESS OF BREATH Patient taking differently: Inhale 2 puffs into the lungs every 6 (six) hours as needed for wheezing or shortness of breath. 07/07/20   Tanda Rockers, MD  sodium chloride (OCEAN) 0.65 % nasal spray Place 1 spray into the nose 4 (four) times daily as needed for congestion.     [provider]  vitamin B-12 (CYANOCOBALAMIN) 1000 MCG tablet Take 1,000 mcg by mouth daily.    [provider]     Allergies    Other, Codeine, and Sulfa antibiotics  Review of Systems   Review of Systems  Constitutional: Negative for appetite change and fatigue.  HENT: Negative for congestion, ear discharge and sinus pressure.   Eyes: Negative for discharge.  Respiratory: Positive for shortness of breath. Negative for cough.   Cardiovascular: Negative for chest pain.  Gastrointestinal: Negative for abdominal pain and diarrhea.  Genitourinary: Negative for frequency and hematuria.  Musculoskeletal: Negative for back pain.  Skin: Negative for rash.  Neurological: Negative for seizures and headaches.  Psychiatric/Behavioral: Negative for hallucinations.    Physical Exam Updated Vital Signs BP (!) 96/53   Pulse (!) 109   Temp 99.1 F (37.3 C) (Oral)   Resp (!) 22   Ht 5'  10" (1.778 m)   Wt 110.7 kg   SpO2 97%   BMI 35.01 kg/m   Physical Exam Vitals and nursing note reviewed.  Constitutional:      Appearance: He is well-developed.  HENT:     Head: Normocephalic.     Nose: Nose normal.  Eyes:     General: No scleral icterus.    Extraocular Movements: EOM normal.     Conjunctiva/sclera: Conjunctivae normal.  Neck:     Thyroid: No thyromegaly.  Cardiovascular:     Rate and Rhythm: Normal rate and regular rhythm.     Heart sounds: No murmur heard. No friction rub. No gallop.   Pulmonary:     Breath sounds: No stridor. Wheezing present. No rales.  Chest:     Chest wall: No tenderness.  Abdominal:     General: There is no distension.     Tenderness: There is no abdominal tenderness. There is no rebound.  Musculoskeletal:        General: No edema. Normal range of motion.     Cervical back: Neck supple.  Lymphadenopathy:     Cervical: No cervical adenopathy.  Skin:    Findings: No erythema or rash.  Neurological:     Mental Status: He is alert and oriented to person, place, and time.     Motor: No abnormal muscle tone.     Coordination: Coordination normal.   Psychiatric:        Mood and Affect: Mood and affect normal.        Behavior: Behavior normal.     ED Results / Procedures / Treatments   Labs (all labs ordered are listed, but only abnormal results are displayed) Labs Reviewed  CBC WITH DIFFERENTIAL/PLATELET - Abnormal; Notable for the following components:      Result Value   WBC 17.7 (*)    RBC 3.82 (*)    Hemoglobin 12.3 (*)    HCT 38.8 (*)    MCV 101.6 (*)    Neutro Abs 13.9 (*)    Monocytes Absolute 1.3 (*)    Abs Immature Granulocytes 0.25 (*)    All other components within normal limits  COMPREHENSIVE METABOLIC PANEL - Abnormal; Notable for the following components:   Glucose, Bld 109 (*)    Calcium 8.7 (*)    All other components within normal limits  BRAIN NATRIURETIC PEPTIDE - Abnormal; Notable for the following components:   B Natriuretic Peptide 107.0 (*)    All other components within normal limits  TROPONIN I (HIGH SENSITIVITY) - Abnormal; Notable for the following components:   Troponin I (High Sensitivity) 63 (*)    All other components within normal limits  RESP PANEL BY RT-PCR (FLU A&B, COVID) ARPGX2  PROCALCITONIN  POC SARS CORONAVIRUS 2 AG -  ED  TROPONIN I (HIGH SENSITIVITY)    EKG EKG Interpretation  Date/Time:  Tuesday July 22 2020 07:27:38 EST Ventricular Rate:  126 PR Interval:    QRS Duration: 142 QT Interval:  360 QTC Calculation: 522 R Axis:   63 Text Interpretation: Atrial fibrillation Left bundle branch block Confirmed by Milton Ferguson [phone]) on 07/22/2020 7:35:56 AM   Radiology DG Chest Portable 1 View  Result Date: 07/22/2020 CLINICAL DATA:  Shortness of breath for 2 days Hypertension COPD Former smoker EXAM: PORTABLE CHEST 1 VIEW COMPARISON:  06/18/2020 FINDINGS: Cardiomediastinal silhouette and pulmonary vasculature are within normal limits. New airspace opacity at the right lung base suspicious for pneumonia. Nodular opacity again seen in  the right upper lung measuring 1.2 cm.  IMPRESSION: 1. New airspace opacity at the right lung base suspicious for pneumonia. 2. 1.2 cm nodule again seen in the right upper lung. Further evaluation with contrast enhanced CT again recommended. These results will be called to the ordering clinician or representative by the Radiologist Assistant, and communication documented in the PACS or [employer]. Electronically Signed   By: Miachel Roux M.D.   On: 07/22/2020 08:16    Procedures Procedures   Medications Ordered in ED Medications  magnesium sulfate IVPB 2 g 50 mL (2 g Intravenous New Bag/Given 07/22/20 0852)  levofloxacin (LEVAQUIN) IVPB 500 mg (500 mg Intravenous New Bag/Given 07/22/20 0850)    ED Course  I have reviewed the triage vital signs and the nursing notes.  Pertinent labs & imaging results that were available during my care of the patient were reviewed by me and considered in my medical decision making (see chart for details). CRITICAL CARE Performed by: Milton Ferguson Total critical care time: 45 minutes Critical care time was exclusive of separately billable procedures and treating other patients. Critical care was necessary to treat or prevent imminent or life-threatening deterioration. Critical care was time spent personally by me on the following activities: development of treatment plan with patient and/or surrogate as well as nursing, discussions with consultants, evaluation of patient's response to treatment, examination of patient, obtaining history from patient or surrogate, ordering and performing treatments and interventions, ordering and review of laboratory studies, ordering and review of radiographic studies, pulse oximetry and re-evaluation of patient's condition.    MDM Rules/Calculators/A&P                          Patient with COPD exacerbation hypoxia and pneumonia.  He will be admitted to medicine Final Clinical Impression(s) / ED Diagnoses Final diagnoses:  Community acquired pneumonia of  right lower lobe of lung    Rx / DC Orders ED Discharge Orders    None       Milton Ferguson, MD 07/22/20 1005

## 2020-07-23 DIAGNOSIS — A419 Sepsis, unspecified organism: Secondary | ICD-10-CM | POA: Diagnosis not present

## 2020-07-23 DIAGNOSIS — L8995 Pressure ulcer of unspecified site, unstageable: Secondary | ICD-10-CM

## 2020-07-23 DIAGNOSIS — L8990 Pressure ulcer of unspecified site, unspecified stage: Secondary | ICD-10-CM | POA: Insufficient documentation

## 2020-07-23 LAB — BASIC METABOLIC PANEL
Anion gap: 11 (ref 5–15)
BUN: 21 mg/dL (ref 8–23)
CO2: 26 mmol/L (ref 22–32)
Calcium: 8.4 mg/dL — ABNORMAL LOW (ref 8.9–10.3)
Chloride: 102 mmol/L (ref 98–111)
Creatinine, Ser: 0.74 mg/dL (ref 0.61–1.24)
GFR, Estimated: >60 mL/min (ref >60)
Glucose, Bld: 131 mg/dL — ABNORMAL HIGH (ref 70–99)
Potassium: 4.1 mmol/L (ref 3.5–5.1)
Sodium: 139 mmol/L (ref 135–145)

## 2020-07-23 LAB — MRSA PCR SCREENING: MRSA by PCR: NEGATIVE

## 2020-07-23 LAB — CBC
HCT: 33.7 % — ABNORMAL LOW (ref 39.0–52.0)
Hemoglobin: 10.6 g/dL — ABNORMAL LOW (ref 13.0–17.0)
MCH: 31.5 pg (ref 26.0–34.0)
MCHC: 31.5 g/dL (ref 30.0–36.0)
MCV: 100 fL (ref 80.0–100.0)
Platelets: 340 10*3/uL (ref 150–400)
RBC: 3.37 MIL/uL — ABNORMAL LOW (ref 4.22–5.81)
RDW: 14 % (ref 11.5–15.5)
WBC: 14.7 10*3/uL — ABNORMAL HIGH (ref 4.0–10.5)
nRBC: 0 % (ref 0.0–0.2)

## 2020-07-23 LAB — LACTIC ACID, PLASMA: Lactic Acid, Venous: 0.8 mmol/L (ref 0.5–1.9)

## 2020-07-23 LAB — MAGNESIUM: Magnesium: 2.5 mg/dL — ABNORMAL HIGH (ref 1.7–2.4)

## 2020-07-23 LAB — PROCALCITONIN: Procalcitonin: 0.38 ng/mL

## 2020-07-23 LAB — TROPONIN I (HIGH SENSITIVITY): Troponin I (High Sensitivity): 31 ng/L — ABNORMAL HIGH (ref <18)

## 2020-07-23 LAB — STREP PNEUMONIAE URINARY ANTIGEN: Strep Pneumo Urinary Antigen: NEGATIVE

## 2020-07-23 NOTE — Consult Note (Addendum)
[facility] Nurse Consult Note: Reason for Consult: Consult requested for BLE.  Performed remotely via camera into the ICU room with assistance from the bedside nurse. Wound type: Bilat legs with some edema and erythremia; appearance is consistent with venous stasis changes.  Scattered fluid filled blisters and mod amt yellow weeping drainage, patchy areas have evolved into partial thickness skin loss.  Right heel with stage 3 pressure injury; approx 3X3X.2cm, red and moist Pressure Injury POA: Yes Dressing procedure/placement/frequency: Topical treatment orders provided for bedside nurses to perform as follows to absorb drainage and promote healing: Float heels to reduce pressure. Apply xeroform gauze to left leg weeping areas Q day, then cover with ABD pads and kerlex Apply a piece of Aquacel Kellie Simmering # [phone]) to right heel Q day, then cover with foam dressing.  (Change foam dressing Q 3 days or PRN soiling.) Please re-consult if further assistance is needed.  Thank-you,  Julien Girt MSN, [place], [place], [place], [place]

## 2020-07-23 NOTE — Progress Notes (Signed)
PROGRESS NOTE    Patient: Joel Perez                            PCP: [name], MD                    DOB: June 04, 1945            DOA: 07/22/2020 NWG:[id number]             DOS: 07/23/2020, 1:00 PM   LOS: 1 day   Date of Service: The patient was seen and examined on 07/23/2020  Subjective:   The patient was seen and examined this morning. Stable at this time. Comfortable on 3 L of oxygen, satting 92%>mild shortness of breath Otherwise no issues overnight .  Brief Narrative:   Joel Perez is a 75 y.o. male with medical history significant for chronic diastolic congestive heart failure, IVCD with LBBB, chronic venous insufficiency with stasis dermatitis, PAD, neuropathy, and COPD with chronic hypoxemia and steroid use who presented to the ED via EMS with worsening shortness of breath and cough for the last few days.  His cough has been productive of yellow sputum.  His symptoms seem to improve with breathing treatments, no particular aggravating factors are noted.  Usually he wears 4 L nasal cannula at home.  He denies any chest pain, fever, chills, abdominal pain, nausea, or vomiting.  He states that his legs are typically swollen.  He states that he has had his Covid vaccines administered as well.  ED Course:  Patient noted to have sinus tachycardia is also pressure readings have been 90 systolic range. Leukocytosis of over 17,000 noted and chest x-ray suggestive of right lower lung base pneumonia. Troponins 63 with follow-up troponin 75.  Procalcitonin 0.23 and BNP 107.  He was given some Levaquin and magnesium sulfate.  Unfortunately blood cultures were not collected, nor was a lactic acid collected.  Blood cultures have been collected after Levaquin was given in the ED.  Lactic acid was checked with initial level being 3.3.  Fluid bolus of 30 cc/kg was administered with improvement in blood pressure and heart rate.  Patient currently without significant respiratory distress and not his  usual 4 L nasal cannula.  Covid testing and respiratory panel negative.   Assessment & Plan:   Active Problems:   Sepsis ([place])   Pressure injury of skin   Severe sepsis secondary to right lower lobe pneumonia -Improved sepsis physiology -afebrile normotensive this a.m. -On admission met the sepsis criteria: With tachycardia, tachypnea, hypotension, respiratory distress, with a source of infection pneumonia Noted to have leukocytosis and lactic acidosis, procalcitonin only 0.23 -We will continue with IV antibiotics of azithromycin and Rocephin for now -Blood cultures >> negative to date -Status post IV fluid resuscitation per sepsis protocol -Vitals stable, improved leukocytosis, lactic acid -Covid testing negative, urine Legionella and strep pneumonia ordered as well as sputum culture  Acute COPD exacerbation related to above -Noted to have chronic hypoxemia on 4 L nasal cannula at home -Currently satting 99% on 3 L of oxygen -Patient noted to have pulmonary nodule on chest x-ray, obtain CT chest with contrast as recommended -We will continue antibiotics -Continue DuoNebs every 6 hours --due to tachycardia, will be switched to Xopenex and Atrovent -We will continue with IV Solu-Medrol twice daily -Mucolytic's and antitussives  Elevated troponin -Likely related to demand ischemia from sepsis physiology (troponin 38, 31,) -No EKG  changes -Continues to deny any chest pain, -Check 2D echocardiogram: Reviewed prior 12/2018 with LVEF 60-65%  History of chronic diastolic CHF -Patient given IV fluid bolus in ED, will need careful monitoring  -Check 2D echocardiogram as noted above -Holding Lasix for now given soft blood pressure readings and sepsis physiology -Monitor daily weights -Will likely require some diuresis after sepsis physiology has resolved  Chronic venous insufficiency/PUD/neuropathy -Continue aspirin, statin, gabapentin -We will discontinue IV fluids  History  of hypertension -Continue to improve, -Hold home losartan and Lasix for 1 more day  History of IVCD/LBBB -Noted on EKG  Obesity -BMI 35.01 kg/m -Lifestyle changes outpatient  DVT prophylaxis: Lovenox Code Status: Full Family Communication: with the Patient  Disposition Plan: Admit for treatment of sepsis/pna and copd Consults called:None Admission status: Inpatient, SDU  ---------------------------------------------------------------------------------------------------------------------------------- Nutritional status:  The patient's BMI is: Body mass index is 36.28 kg/m. I agree with the assessment and plan as outlined ---  Skin Assessment: I have examined the patient's skin and I agree with the wound assessment as performed by wound care team As outlined belowe: Pressure Injury 07/22/20 Buttocks Left Stage 2 -  Partial thickness loss of dermis presenting as a shallow open injury with a red, pink wound bed without slough. (Active)  07/22/20 1600  Location: Buttocks  Location Orientation: Left  Staging: Stage 2 -  Partial thickness loss of dermis presenting as a shallow open injury with a red, pink wound bed without slough.  Wound Description (Comments):   Present on Admission: Yes     Pressure Injury 07/23/20 Heel Right Stage 3 -  Full thickness tissue loss. Subcutaneous fat may be visible but bone, tendon or muscle are NOT exposed. (Active)  07/23/20 0800  Location: Heel  Location Orientation: Right  Staging: Stage 3 -  Full thickness tissue loss. Subcutaneous fat may be visible but bone, tendon or muscle are NOT exposed.  Wound Description (Comments):   Present on Admission: Yes     ---------------------------------------------------------------------------------------------------------------------------------------------------- Cultures; Blood Cultures x 2 >> NGT Sputum Culture >> NGT   Antimicrobials: -3-22 IV antibiotics of Rocephin,  azithromycin  Consultants: None    Remains inpatient appropriate because:Hemodynamically unstable and Inpatient level of care appropriate due to severity of illness   Dispo: The patient is from: Home              Anticipated d/c is to: Home              Patient currently is not medically stable to d/c.   Difficult to place patient No      Level of care: Stepdown   Procedures:   No admission procedures for hospital encounter.     Antimicrobials:  Anti-infectives (From admission, onward)   Start     Dose/Rate Route Frequency Ordered Stop   07/23/20 1000  azithromycin (ZITHROMAX) 500 mg in sodium chloride 0.9 % 250 mL IVPB        500 mg 250 mL/hr over 60 Minutes Intravenous Every 24 hours 07/22/20 1220     07/22/20 1230  cefTRIAXone (ROCEPHIN) 1 g in sodium chloride 0.9 % 100 mL IVPB        1 g 200 mL/hr over 30 Minutes Intravenous Every 24 hours 07/22/20 1220     07/22/20 0845  levofloxacin (LEVAQUIN) IVPB 500 mg        500 mg 100 mL/hr over 60 Minutes Intravenous  Once 07/22/20 0831 07/22/20 1022       Medication:  . aspirin EC  81 mg Oral Daily  . atorvastatin  40 mg Oral Daily  . budesonide (PULMICORT) nebulizer solution  0.5 mg Nebulization BID  . Chlorhexidine Gluconate Cloth  6 each Topical Daily  . enoxaparin (LOVENOX) injection  55 mg Subcutaneous Q24H  . gabapentin  600 mg Oral Q8H  . multivitamin with minerals  1 tablet Oral Daily  . predniSONE  40 mg Oral Q breakfast  . sodium chloride flush  3 mL Intravenous Q12H  . vitamin B-12  1,000 mcg Oral Daily    sodium chloride, acetaminophen **OR** acetaminophen, ondansetron **OR** ondansetron (ZOFRAN) IV, oxyCODONE-acetaminophen **AND** oxyCODONE, sodium chloride, sodium chloride flush   Objective:   Vitals:   07/23/20 1000 07/23/20 1100 07/23/20 1146 07/23/20 1200  BP: 139/65 134/66  139/71  Pulse: 99 94 93 65  Resp: $Remo'17 18 14 'sMAwy$ (!) 21  Temp:   97.8 F (36.6 C)   TempSrc:   Oral   SpO2: 100% 100%  100% 99%  Weight:      Height:        Intake/Output Summary (Last 24 hours) at 07/23/2020 1300 Last data filed at 07/23/2020 1128 Gross per 24 hour  Intake 926.11 ml  Output 1300 ml  Net -373.89 ml   Filed Weights   07/22/20 0720 07/22/20 1547 07/23/20 0424  Weight: 110.7 kg 115.4 kg 114.7 kg     Examination:   Physical Exam  Constitution:  Alert, cooperative, no distress,  Appears calm and comfortable  Psychiatric: Normal and stable mood and affect, cognition intact,   HEENT: Normocephalic, PERRL, otherwise with in Normal limits  Chest:Chest symmetric Cardio vascular:  S1/S2, RRR, No murmure, No Rubs or Gallops  pulmonary: Diffuse rhonchi-  respirations unlabored, mild diffuse wheezes / no crackles Abdomen: Soft, non-tender, non-distended, bowel sounds,no masses, no organomegaly Muscular skeletal: Limited exam - in bed, able to move all 4 extremities, Normal strength,  Neuro: CNII-XII intact. , normal motor and sensation, reflexes intact  Extremities: No pitting edema lower extremities, +2 pulses  Skin: Dry, warm to touch, negative for any Rashes, No open wounds Wounds: per nursing documentation Pressure Injury 07/22/20 Buttocks Left Stage 2 -  Partial thickness loss of dermis presenting as a shallow open injury with a red, pink wound bed without slough. (Active)  07/22/20 1600  Location: Buttocks  Location Orientation: Left  Staging: Stage 2 -  Partial thickness loss of dermis presenting as a shallow open injury with a red, pink wound bed without slough.  Wound Description (Comments):   Present on Admission: Yes     Pressure Injury 07/23/20 Heel Right Stage 3 -  Full thickness tissue loss. Subcutaneous fat may be visible but bone, tendon or muscle are NOT exposed. (Active)  07/23/20 0800  Location: Heel  Location Orientation: Right  Staging: Stage 3 -  Full thickness tissue loss. Subcutaneous fat may be visible but bone, tendon or muscle are NOT exposed.  Wound Description  (Comments):   Present on Admission: Yes    ------------------------------------------------------------------------------------------------------------------------------------------    LABs:  CBC Latest Ref Rng & Units 07/23/2020 07/22/2020 11/14/2018  WBC 4.0 - 10.5 K/uL 14.7(H) 17.7(H) 11.0(H)  Hemoglobin 13.0 - 17.0 g/dL 10.6(L) 12.3(L) 15.7  Hematocrit 39.0 - 52.0 % 33.7(L) 38.8(L) 47.9  Platelets 150 - 400 K/uL 340 358 233.0   CMP Latest Ref Rng & Units 07/23/2020 07/22/2020 01/04/2019  Glucose 70 - 99 mg/dL 131(H) 109(H) 80  BUN 8 - 23 mg/dL $Remove'21 17 12  'pVEbLjC$ Creatinine 0.61 - 1.24 mg/dL 0.74  1.00 0.94  Sodium 135 - 145 mmol/L 139 138 140  Potassium 3.5 - 5.1 mmol/L 4.1 4.0 4.1  Chloride 98 - 111 mmol/L 102 99 98  CO2 22 - 32 mmol/L $RemoveB'26 27 24  'KRuRsXNI$ Calcium 8.9 - 10.3 mg/dL 8.4(L) 8.7(L) 9.5  Total Protein 6.5 - 8.1 g/dL - 7.1 -  Total Bilirubin 0.3 - 1.2 mg/dL - 0.6 -  Alkaline Phos 38 - 126 U/L - 51 -  AST 15 - 41 U/L - 30 -  ALT 0 - 44 U/L - 28 -       Micro Results Recent Results (from the past 240 hour(s))  Resp Panel by RT-PCR (Flu A&B, Covid) Nasopharyngeal Swab     Status: None   Collection Time: 07/22/20  9:11 AM   Specimen: Nasopharyngeal Swab; Nasopharyngeal(NP) swabs in vial transport medium  Result Value Ref Range Status   SARS Coronavirus 2 by RT PCR NEGATIVE NEGATIVE Final    Comment: (NOTE) SARS-CoV-2 target nucleic acids are NOT DETECTED.  The SARS-CoV-2 RNA is generally detectable in upper respiratory specimens during the acute phase of infection. The lowest concentration of SARS-CoV-2 viral copies this assay can detect is 138 copies/mL. A negative result does not preclude SARS-Cov-2 infection and should not be used as the sole basis for treatment or other patient management decisions. A negative result may occur with  improper specimen collection/handling, submission of specimen other than nasopharyngeal swab, presence of viral mutation(s) within the areas targeted  by this assay, and inadequate number of viral copies(<138 copies/mL). A negative result must be combined with clinical observations, patient history, and epidemiological information. The expected result is Negative.  Fact Sheet for Patients:  EntrepreneurPulse.com.au  Fact Sheet for Healthcare Providers:  IncredibleEmployment.be  This test is no t yet approved or cleared by the [place] FDA and  has been authorized for detection and/or diagnosis of SARS-CoV-2 by FDA under an Emergency Use Authorization (EUA). This EUA will remain  in effect (meaning this test can be used) for the duration of the COVID-19 declaration under Section 564(b)(1) of the Act, 21 U.S.C.section 360bbb-3(b)(1), unless the authorization is terminated  or revoked sooner.       Influenza A by PCR NEGATIVE NEGATIVE Final   Influenza B by PCR NEGATIVE NEGATIVE Final    Comment: (NOTE) The Xpert Xpress SARS-CoV-2/FLU/RSV plus assay is intended as an aid in the diagnosis of influenza from Nasopharyngeal swab specimens and should not be used as a sole basis for treatment. Nasal washings and aspirates are unacceptable for Xpert Xpress SARS-CoV-2/FLU/RSV testing.  Fact Sheet for Patients: EntrepreneurPulse.com.au  Fact Sheet for Healthcare Providers: IncredibleEmployment.be  This test is not yet approved or cleared by the [place] FDA and has been authorized for detection and/or diagnosis of SARS-CoV-2 by FDA under an Emergency Use Authorization (EUA). This EUA will remain in effect (meaning this test can be used) for the duration of the COVID-19 declaration under Section 564(b)(1) of the Act, 21 U.S.C. section 360bbb-3(b)(1), unless the authorization is terminated or revoked.  Performed at [facility], [address], [place], [place] [address]   Culture, blood (routine x 2)     Status: None (Preliminary result)   Collection  Time: 07/22/20 12:44 PM   Specimen: BLOOD  Result Value Ref Range Status   Specimen Description BLOOD BLOOD RIGHT HAND  Final   Special Requests   Final    Blood Culture adequate volume BOTTLES DRAWN AEROBIC AND ANAEROBIC   Culture  Final    NO GROWTH < 24 HOURS Performed at [facility], [address] [place], [place] [address]    Report Status PENDING  Incomplete  Culture, blood (routine x 2)     Status: None (Preliminary result)   Collection Time: 07/22/20 12:53 PM   Specimen: BLOOD  Result Value Ref Range Status   Specimen Description BLOOD BLOOD LEFT HAND  Final   Special Requests   Final    Blood Culture adequate volume BOTTLES DRAWN AEROBIC AND ANAEROBIC   Culture   Final    NO GROWTH < 24 HOURS Performed at [facility], [address], [place], [place] [address]    Report Status PENDING  Incomplete  MRSA PCR Screening     Status: None   Collection Time: 07/22/20  4:09 PM   Specimen: Nasal Mucosa; Nasopharyngeal  Result Value Ref Range Status   MRSA by PCR NEGATIVE NEGATIVE Final    Comment:        The GeneXpert MRSA Assay (FDA approved for NASAL specimens only), is one component of a comprehensive MRSA colonization surveillance program. It is not intended to diagnose MRSA infection nor to guide or monitor treatment for MRSA infections. Performed at [facility], [address], [place], [place] [address]     Radiology Reports CT Chest W Contrast  Result Date: 07/22/2020 CLINICAL DATA:  Right upper lobe pulmonary nodule, pneumonia, productive cough and worsening shortness of breath for several days EXAM: CT CHEST WITH CONTRAST TECHNIQUE: Multidetector CT imaging of the chest was performed during intravenous contrast administration. CONTRAST:  51mL OMNIPAQUE IOHEXOL 300 MG/ML  SOLN COMPARISON:  07/22/2020, 06/18/2020 FINDINGS: Cardiovascular: The heart and great vessels are unremarkable without pericardial effusion. No evidence of thoracic aortic aneurysm or  dissection. Moderate atherosclerosis of the aorta. Extensive atherosclerosis of the coronary vasculature, greatest in the LAD distribution. Mediastinum/Nodes: There is pathologic mediastinal and right hilar adenopathy. Enlarged precarinal lymph node measuring 14 mm image 62/2 and right hilar lymph node measuring 14 mm image 69/2. The thyroid and trachea are unremarkable. There is mild diffuse esophageal wall thickening, nonspecific. Lungs/Pleura: As seen on recent chest x-ray, there is a spiculated right upper lobe pulmonary nodule, measuring 17 x 15 by 16 mm reference image 50/5 and 109/6, highly concerning for malignancy. There is a smaller satellite nodule more inferiorly within the right upper lobe, measuring approximately 7 x 7 x 7 mm reference image 68/5 and 101/6, also suspicious for malignancy. Multifocal airspace disease is seen within the right middle and right lower lobes, with opacification of the distal bronchi. Overall favor pneumonia over aspiration. Upper lobe predominant emphysema again noted. No evidence of effusion or pneumothorax. Upper Abdomen: No acute abnormality. Musculoskeletal: No acute or destructive bony lesions. Reconstructed images demonstrate no additional findings. IMPRESSION: 1. 16 mm mean diameter right upper lobe spiculated nodule, with smaller 7 mm spiculated satellite nodule in the right upper lobe, both concerning for malignancy. PET-CT recommended for further evaluation. 2. Multifocal airspace disease within the right middle and right lower lobes, consistent with pneumonia. 3. Mediastinal and right hilar lymphadenopathy. Metastatic disease cannot be excluded. 4. Nonspecific diffuse esophageal wall thickening, which could reflect esophagitis. 5. Aortic Atherosclerosis (ICD10-I70.0) and Emphysema (ICD10-J43.9). Electronically Signed   By: Randa Ngo M.D.   On: 07/22/2020 15:37   DG Chest Portable 1 View  Result Date: 07/22/2020 CLINICAL DATA:  Shortness of breath for 2  days Hypertension COPD Former smoker EXAM: PORTABLE CHEST 1 VIEW COMPARISON:  06/18/2020 FINDINGS:  Cardiomediastinal silhouette and pulmonary vasculature are within normal limits. New airspace opacity at the right lung base suspicious for pneumonia. Nodular opacity again seen in the right upper lung measuring 1.2 cm. IMPRESSION: 1. New airspace opacity at the right lung base suspicious for pneumonia. 2. 1.2 cm nodule again seen in the right upper lung. Further evaluation with contrast enhanced CT again recommended. These results will be called to the ordering clinician or representative by the Radiologist Assistant, and communication documented in the PACS or [employer]. Electronically Signed   By: Miachel Roux M.D.   On: 07/22/2020 08:16   ECHOCARDIOGRAM COMPLETE  Result Date: 07/22/2020    ECHOCARDIOGRAM REPORT   Patient Name:   Joel Perez Date of Exam: 07/22/2020 Medical Rec #:  [id number]     Height:       70.0 in Accession #:    [id number]    Weight:       244.0 lb Date of Birth:  1946/03/20     BSA:          2.271 m Patient Age:    45 years      BP:           105/55 mmHg Patient Gender: M             HR:           98 bpm. Exam Location:  [facility] Procedure: 2D Echo Indications:    Elevated Troponin  History:        Patient has prior history of Echocardiogram examinations, most                 recent 12/27/2018. COPD; Risk Factors:Former Smoker. Sepsis,                 Chronic Respiratory Failure, LBBB.  Sonographer:    Leavy Cella RDCS (AE) Referring Phys: [phone] Royanne Foots H[place]  1. Left ventricular ejection fraction, by estimation, is 60 to 65%. The left ventricle has normal function. The left ventricle has no regional wall motion abnormalities. There is mild left ventricular hypertrophy. Left ventricular diastolic parameters are indeterminate.  2. Right ventricular systolic function is normal. The right ventricular size is normal.  3. The mitral valve is normal in structure. No  evidence of mitral valve regurgitation. No evidence of mitral stenosis.  4. The aortic valve was not well visualized. There is moderate calcification of the aortic valve. There is moderate thickening of the aortic valve. Aortic valve regurgitation is not visualized. Mild aortic valve stenosis.  5. The inferior vena cava is normal in size with greater than 50% respiratory variability, suggesting right atrial pressure of 3 mmHg. FINDINGS  Left Ventricle: Left ventricular ejection fraction, by estimation, is 60 to 65%. The left ventricle has normal function. The left ventricle has no regional wall motion abnormalities. The left ventricular internal cavity size was normal in size. There is  mild left ventricular hypertrophy. Left ventricular diastolic parameters are indeterminate. Right Ventricle: The right ventricular size is normal. No increase in right ventricular wall thickness. Right ventricular systolic function is normal. Left Atrium: Left atrial size was normal in size. Right Atrium: Right atrial size was normal in size. Pericardium: There is no evidence of pericardial effusion. Mitral Valve: The mitral valve is normal in structure. No evidence of mitral valve regurgitation. No evidence of mitral valve stenosis. Tricuspid Valve: The tricuspid valve is normal in structure. Tricuspid valve regurgitation is not demonstrated. No evidence of tricuspid stenosis.  Aortic Valve: The aortic valve was not well visualized. There is moderate calcification of the aortic valve. There is moderate thickening of the aortic valve. There is moderate aortic valve annular calcification. Aortic valve regurgitation is not visualized. Mild aortic stenosis is present. Aortic valve mean gradient measures 12.5 mmHg. Aortic valve peak gradient measures 22.7 mmHg. Aortic valve area, by VTI measures 1.60 cm. Pulmonic Valve: The pulmonic valve was not well visualized. Pulmonic valve regurgitation is not visualized. No evidence of pulmonic  stenosis. Aorta: The aortic root is normal in size and structure. Pulmonary Artery: Indeterminant PASP, inadequate TR jet. Venous: The inferior vena cava is normal in size with greater than 50% respiratory variability, suggesting right atrial pressure of 3 mmHg. IAS/Shunts: The interatrial septum was not well visualized.  LEFT VENTRICLE PLAX 2D LVIDd:         5.19 cm  Diastology LVIDs:         3.22 cm  LV e' medial:    9.79 cm/s LV PW:         1.02 cm  LV E/e' medial:  16.2 LV IVS:        1.10 cm  LV e' lateral:   17.80 cm/s LVOT diam:     2.20 cm  LV E/e' lateral: 8.9 LV SV:         75 LV SV Index:   33 LVOT Area:     3.80 cm  RIGHT VENTRICLE RV S prime:     16.80 cm/s TAPSE (M-mode): 3.0 cm LEFT ATRIUM             Index       RIGHT ATRIUM           Index LA diam:        3.60 cm 1.58 cm/m  RA Area:     13.90 cm LA Vol (A2C):   76.4 ml 33.64 ml/m RA Volume:   34.90 ml  15.36 ml/m LA Vol (A4C):   25.8 ml 11.36 ml/m LA Biplane Vol: 44.0 ml 19.37 ml/m  AORTIC VALVE AV Area (Vmax):    1.85 cm AV Area (Vmean):   1.85 cm AV Area (VTI):     1.60 cm AV Vmax:           238.47 cm/s AV Vmean:          162.164 cm/s AV VTI:            0.467 m AV Peak Grad:      22.7 mmHg AV Mean Grad:      12.5 mmHg LVOT Vmax:         115.78 cm/s LVOT Vmean:        78.954 cm/s LVOT VTI:          0.196 m LVOT/AV VTI ratio: 0.42  AORTA Ao Root diam: 2.70 cm MITRAL VALVE MV Area (PHT): 4.65 cm     SHUNTS MV Decel Time: 163 msec     Systemic VTI:  0.20 m MV E velocity: 159.00 cm/s  Systemic Diam: 2.20 cm Carlyle Dolly MD Electronically signed by Carlyle Dolly MD Signature Date/Time: 07/22/2020/4:18:33 PM    Final     SIGNED: Deatra James, MD, FHM. [facility],  Pager (please use [email or web address] to page/text) Please use Epic Secure Chat for non-urgent communication (7AM-7PM)  If 7PM-7AM, please contact night-coverage [email or web address], 07/23/2020, 1:00 PM

## 2020-07-24 DIAGNOSIS — L8995 Pressure ulcer of unspecified site, unstageable: Secondary | ICD-10-CM | POA: Diagnosis not present

## 2020-07-24 DIAGNOSIS — I739 Peripheral vascular disease, unspecified: Secondary | ICD-10-CM

## 2020-07-24 DIAGNOSIS — A419 Sepsis, unspecified organism: Secondary | ICD-10-CM | POA: Diagnosis not present

## 2020-07-24 DIAGNOSIS — M7989 Other specified soft tissue disorders: Secondary | ICD-10-CM | POA: Diagnosis not present

## 2020-07-24 DIAGNOSIS — J449 Chronic obstructive pulmonary disease, unspecified: Secondary | ICD-10-CM | POA: Diagnosis not present

## 2020-07-24 LAB — CBC WITH DIFFERENTIAL/PLATELET
Abs Immature Granulocytes: 0.14 10*3/uL — ABNORMAL HIGH (ref 0.00–0.07)
Basophils Absolute: 0 10*3/uL (ref 0.0–0.1)
Basophils Relative: 0 %
Eosinophils Absolute: 0 10*3/uL (ref 0.0–0.5)
Eosinophils Relative: 0 %
HCT: 35 % — ABNORMAL LOW (ref 39.0–52.0)
Hemoglobin: 10.9 g/dL — ABNORMAL LOW (ref 13.0–17.0)
Immature Granulocytes: 1 %
Lymphocytes Relative: 14 %
Lymphs Abs: 1.7 10*3/uL (ref 0.7–4.0)
MCH: 31.1 pg (ref 26.0–34.0)
MCHC: 31.1 g/dL (ref 30.0–36.0)
MCV: 100 fL (ref 80.0–100.0)
Monocytes Absolute: 0.8 10*3/uL (ref 0.1–1.0)
Monocytes Relative: 6 %
Neutro Abs: 9.8 10*3/uL — ABNORMAL HIGH (ref 1.7–7.7)
Neutrophils Relative %: 79 %
Platelets: 343 10*3/uL (ref 150–400)
RBC: 3.5 MIL/uL — ABNORMAL LOW (ref 4.22–5.81)
RDW: 14.1 % (ref 11.5–15.5)
WBC: 12.4 10*3/uL — ABNORMAL HIGH (ref 4.0–10.5)
nRBC: 0 % (ref 0.0–0.2)

## 2020-07-24 LAB — COMPREHENSIVE METABOLIC PANEL
ALT: 33 U/L (ref 0–44)
AST: 29 U/L (ref 15–41)
Albumin: 2.9 g/dL — ABNORMAL LOW (ref 3.5–5.0)
Alkaline Phosphatase: 42 U/L (ref 38–126)
Anion gap: 9 (ref 5–15)
BUN: 26 mg/dL — ABNORMAL HIGH (ref 8–23)
CO2: 27 mmol/L (ref 22–32)
Calcium: 8.5 mg/dL — ABNORMAL LOW (ref 8.9–10.3)
Chloride: 103 mmol/L (ref 98–111)
Creatinine, Ser: 0.83 mg/dL (ref 0.61–1.24)
GFR, Estimated: >60 mL/min (ref >60)
Glucose, Bld: 102 mg/dL — ABNORMAL HIGH (ref 70–99)
Potassium: 4 mmol/L (ref 3.5–5.1)
Sodium: 139 mmol/L (ref 135–145)
Total Bilirubin: 0.5 mg/dL (ref 0.3–1.2)
Total Protein: 6.3 g/dL — ABNORMAL LOW (ref 6.5–8.1)

## 2020-07-24 LAB — LEGIONELLA PNEUMOPHILA SEROGP 1 UR AG: L. pneumophila Serogp 1 Ur Ag: NEGATIVE

## 2020-07-24 LAB — BRAIN NATRIURETIC PEPTIDE: B Natriuretic Peptide: 105 pg/mL — ABNORMAL HIGH (ref 0.0–100.0)

## 2020-07-24 MED ORDER — FUROSEMIDE 10 MG/ML IJ SOLN
40.0000 mg | Freq: Once | INTRAMUSCULAR | Status: AC
  Administered 2020-07-24: 40 mg via INTRAVENOUS
  Filled 2020-07-24: qty 4

## 2020-07-24 MED ORDER — LOSARTAN POTASSIUM 50 MG PO TABS
50.0000 mg | ORAL_TABLET | Freq: Every day | ORAL | Status: DC
Start: 2020-07-24 — End: 2020-07-26
  Administered 2020-07-24 – 2020-07-26 (×3): 50 mg via ORAL
  Filled 2020-07-24 (×3): qty 1

## 2020-07-24 MED ORDER — AZITHROMYCIN 250 MG PO TABS
500.0000 mg | ORAL_TABLET | Freq: Every day | ORAL | Status: DC
  Administered 2020-07-24 – 2020-07-25 (×2): 500 mg via ORAL
  Filled 2020-07-24 (×2): qty 2

## 2020-07-24 NOTE — Progress Notes (Signed)
Asked patient about CPAP.  Patient stated he does not use one at home.  Refused.  Patient currently on [place] 4L at 93%.  Patient uses O2 at home. Patient uses 3L when resting and 4L when up moving around house and cooking.

## 2020-07-24 NOTE — Evaluation (Signed)
Physical Therapy Evaluation Patient Details Name: Joel Perez MRN: [id number] DOB: April 08, 1946 Today's Date: 07/24/2020   History of Present Illness  Joel Perez is a 75 y.o. male with medical history significant for chronic diastolic congestive heart failure, IVCD with LBBB, chronic venous insufficiency with stasis dermatitis, PAD, neuropathy, and COPD with chronic hypoxemia and steroid use who presented to the ED via EMS with worsening shortness of breath and cough for the last few days.  His cough has been productive of yellow sputum.  His symptoms seem to improve with breathing treatments, no particular aggravating factors are noted.  Usually he wears 4 L nasal cannula at home.  He denies any chest pain, fever, chills, abdominal pain, nausea, or vomiting.  He states that his legs are typically swollen.  He states that he has had his Covid vaccines administered as well.    Clinical Impression  Patient demonstrates slightly labored movement for transfers with fair/good standing balance when taking steps at bedside without [place] of an AD, had difficulty attempting to transfer to bed due having slept in a lift chair for the last 2 months, and limited for activity mostly due to SOB.  Patient tolerated sitting up on [facility] to have a bowel movement after therapy - RN notified.  Patient will benefit from continued physical therapy in hospital and recommended venue below to increase strength, balance, endurance for safe ADLs and gait.    Follow Up Recommendations Home health PT;Supervision for mobility/OOB;Supervision - Intermittent    Equipment Recommendations  None recommended by PT    Recommendations for Other Services       Precautions / Restrictions Precautions Precautions: Fall Restrictions Weight Bearing Restrictions: No      Mobility  Bed Mobility Overal bed mobility: Needs Assistance Bed Mobility: Supine to Sit;Sit to Supine     Supine to sit: Min assist     General bed mobility  comments: slow labored movement with difficulty attempting to lift legs back onto bed, ususally sleeps in a lift chairs    Transfers Overall transfer level: Needs assistance Equipment used: Rolling walker (2 wheeled) Transfers: Sit to/from [facility] Sit to Stand: Supervision Stand pivot transfers: Supervision       General transfer comment: increased time, labored movement  Ambulation/Gait Ambulation/Gait assistance: Supervision;Min guard Gait Distance (Feet): 6 Feet Assistive device: None Gait Pattern/deviations: Decreased step length - right;Decreased step length - left;Decreased stride length Gait velocity: decreased   General Gait Details: limited to a few slow labored steps without loss of balance at bedside mostly due to SOB  Stairs            Wheelchair Mobility    Modified Rankin (Stroke Patients Only)       Balance Overall balance assessment: Needs assistance Sitting-balance support: Feet supported;No upper extremity supported Sitting balance-Leahy Scale: Good Sitting balance - Comments: EOB   Standing balance support: During functional activity;No upper extremity supported Standing balance-Leahy Scale: Fair Standing balance comment: without AD                             Pertinent Vitals/Pain Pain Assessment: No/denies pain    Home Living Family/patient expects to be discharged to:: Private residence Living Arrangements: Spouse/significant other Available Help at Discharge: Family;Available 24 hours/day Type of Home: Mobile home Home Access: Ramped entrance     Home Layout: One level Home Equipment: [place] - 2 wheels;Walker - standard;Shower seat - built in;Grab bars -  tub/shower;Grab bars - toilet      Prior Function Level of Independence: Independent with assistive device(s)         Comments: Household ambulator using standard walker, was community ambulator driving 2 months ago, on home O2 4 LPM constant      Hand Dominance        Extremity/Trunk Assessment   Upper Extremity Assessment Upper Extremity Assessment: Overall WFL for tasks assessed    Lower Extremity Assessment Lower Extremity Assessment: Generalized weakness    Cervical / Trunk Assessment Cervical / Trunk Assessment: Normal  Communication   Communication: No difficulties  Cognition Arousal/Alertness: Awake/alert Behavior During Therapy: WFL for tasks assessed/performed Overall Cognitive Status: Within Functional Limits for tasks assessed                                        General Comments      Exercises     Assessment/Plan    PT Assessment Patient needs continued PT services  PT Problem List Decreased strength;Decreased activity tolerance;Decreased balance;Decreased mobility       PT Treatment Interventions DME instruction;Balance training;Gait training;Stair training;Functional mobility training;Therapeutic activities;Therapeutic exercise;Patient/family education    PT Goals (Current goals can be found in the Care Plan section)  Acute Rehab PT Goals Patient Stated Goal: return home with family to assist PT Goal Formulation: With patient Time For Goal Achievement: 07/31/20 Potential to Achieve Goals: Good    Frequency Min 3X/week   Barriers to discharge        Co-evaluation               AM-PAC PT "6 Clicks" Mobility  Outcome Measure Help needed turning from your back to your side while in a flat bed without using bedrails?: A Little Help needed moving from lying on your back to sitting on the side of a flat bed without using bedrails?: A Lot Help needed moving to and from a bed to a chair (including a wheelchair)?: A Little Help needed standing up from a chair using your arms (e.g., wheelchair or bedside chair)?: None Help needed to walk in hospital room?: A Little Help needed climbing 3-5 steps with a railing? : A Lot 6 Click Score: 17    End of Session  Equipment Utilized During Treatment: Oxygen Activity Tolerance: Patient tolerated treatment well;Patient limited by fatigue Patient left: in chair;with call bell/phone within reach;Other (comment);with family/visitor present (left sitting on [facility] Nurse Communication: Mobility status PT Visit Diagnosis: Unsteadiness on feet (R26.81);Other abnormalities of gait and mobility (R26.89);Muscle weakness (generalized) (M62.81)    Time: 8466-5993 PT Time Calculation (min) (ACUTE ONLY): 27 min   Charges:   PT Evaluation $PT Eval Moderate Complexity: 1 Mod PT Treatments $Therapeutic Activity: 23-37 mins        2:27 PM, 07/24/20 Lonell Grandchild, MPT Physical Therapist with [facility] [phone] office [phone] mobile phone

## 2020-07-24 NOTE — Plan of Care (Signed)
  Problem: Acute Rehab PT Goals(only PT should resolve) Goal: Pt Will Go Supine/Side To Sit Outcome: Progressing Flowsheets (Taken 07/24/2020 1430) Pt will go Supine/Side to Sit:  with min guard assist  with minimal assist Goal: Patient Will Transfer Sit To/From Stand Outcome: Progressing Flowsheets (Taken 07/24/2020 1430) Patient will transfer sit to/from stand: with modified independence Goal: Pt Will Transfer Bed To Chair/Chair To Bed Outcome: Progressing Flowsheets (Taken 07/24/2020 1430) Pt will Transfer Bed to Chair/Chair to Bed: with modified independence Goal: Pt Will Ambulate Outcome: Progressing Flowsheets (Taken 07/24/2020 1430) Pt will Ambulate:  50 feet  with supervision  with rolling walker   2:31 PM, 07/24/20 Lonell Grandchild, MPT Physical Therapist with [facility] [phone] office [phone] mobile phone

## 2020-07-24 NOTE — Progress Notes (Signed)
PROGRESS NOTE    Patient: Joel Perez                            PCP: Tally Joe, MD                    DOB: 1945-12-19            DOA: 07/22/2020 MPP:[id number]             DOS: 07/24/2020, 10:06 AM   LOS: 2 days   Date of Service: The patient was seen and examined on 07/24/2020  Subjective:   The patient was seen and examined this morning, awake alert, following command, improved shortness of breath but still complaining of worsening with minimal exertion. Blood pressure also elevated this morning but denies any chest pain... Remains on 4 L of oxygen, satting 100%  Brief Narrative:   TARQUIN WELCHER is a 75 y.o. male with medical history significant for chronic diastolic congestive heart failure, IVCD with LBBB, chronic venous insufficiency with stasis dermatitis, PAD, neuropathy, and COPD with chronic hypoxemia and steroid use who presented to the ED via EMS with worsening shortness of breath and cough for the last few days.  His cough has been productive of yellow sputum.  His symptoms seem to improve with breathing treatments, no particular aggravating factors are noted.  Usually he wears 4 L nasal cannula at home.  He denies any chest pain, fever, chills, abdominal pain, nausea, or vomiting.  He states that his legs are typically swollen.  He states that he has had his Covid vaccines administered as well.  ED Course:  Patient noted to have sinus tachycardia is also pressure readings have been 90 systolic range. Leukocytosis of over 17,000 noted and chest x-ray suggestive of right lower lung base pneumonia. Troponins 63 with follow-up troponin 75.  Procalcitonin 0.23 and BNP 107.  He was given some Levaquin and magnesium sulfate.  Unfortunately blood cultures were not collected, nor was a lactic acid collected.  Blood cultures have been collected after Levaquin was given in the ED.  Lactic acid was checked with initial level being 3.3.  Fluid bolus of 30 cc/kg was administered with  improvement in blood pressure and heart rate.  Patient currently without significant respiratory distress and not his usual 4 L nasal cannula.  Covid testing and respiratory panel negative.   Assessment & Plan:   Principal Problem:   Sepsis (HCC) Active Problems:   COPD III with marked reversibility   Leg swelling   PVD (peripheral vascular disease) (HCC)   Pressure injury of skin   Severe sepsis secondary to right lower lobe pneumonia -Improved sepsis physiology -On admission met the sepsis criteria: With tachycardia, tachypnea, hypotension, respiratory distress, with a source of infection pneumonia -Blood pressure is improved,-hypotensive now Noted to have leukocytosis and lactic acidosis, procalcitonin only 0.23 -We will continue with IV antibiotics of azithromycin and Rocephin for now -Blood cultures >> negative to date -Status post IV fluid resuscitation per sepsis protocol, discontinued now  -Covid testing negative,  -Urine Legionella and strep pneumonia  -sputum culture  Acute COPD exacerbation related to above -Baseline O2 demand at home 40 years -Demand improving  on 4 L of oxygen, satting 100% -Patient noted to have pulmonary nodule on chest x-ray, obtain CT chest with contrast as recommended -We will continue antibiotics -Continue DuoNebs every 6 hours --due to tachycardia, will be switched to Xopenex and  Atrovent -We will continue with IV Solu-Medrol twice daily -Mucolytic's and antitussives  Elevated troponin -Likely related to demand ischemia from sepsis physiology (troponin 38, 31,) -No EKG changes -Continues to deny any chest pain, -Check 2D echocardiogram: Reviewed -no significant changes from previous echo,  prior 12/2018 with LVEF 60-65%  History of chronic diastolic CHF -Patient given IV fluid bolus in ED, will need careful monitoring  -Check 2D echocardiogram as noted above -Holding Lasix for now given soft blood pressure readings and sepsis  physiology -Monitor daily weights -Due to increased lower extremity edema, 1 dose of Lasix 40 mg will be given  Chronic venous insufficiency/PUD/neuropathy -Continue aspirin, statin, gabapentin -We will discontinue IV fluids  History of hypertension -Continue to improve, -Restarting Lasix in a.m. (1 dose 40 mg IV today) -Elevated blood pressure now, restarting home dose of losartan  History of IVCD/LBBB -Noted on EKG  Obesity -BMI 35.01 kg/m -Lifestyle changes outpatient  DVT prophylaxis: Lovenox Code Status: Full Family Communication: with the Patient and his wife at bedside Disposition Plan: Admit for treatment of sepsis/pna and copd Consults called:None Admission status: Inpatient, SDU  ---------------------------------------------------------------------------------------------------------------------------------- Nutritional status:  The patient's BMI is: Body mass index is 35.74 kg/m. I agree with the assessment and plan as outlined ---  Skin Assessment: I have examined the patient's skin and I agree with the wound assessment as performed by wound care team As outlined belowe: Pressure Injury 07/22/20 Buttocks Left Stage 2 -  Partial thickness loss of dermis presenting as a shallow open injury with a red, pink wound bed without slough. (Active)  07/22/20 1600  Location: Buttocks  Location Orientation: Left  Staging: Stage 2 -  Partial thickness loss of dermis presenting as a shallow open injury with a red, pink wound bed without slough.  Wound Description (Comments):   Present on Admission: Yes     Pressure Injury 07/23/20 Heel Right Stage 3 -  Full thickness tissue loss. Subcutaneous fat may be visible but bone, tendon or muscle are NOT exposed. (Active)  07/23/20 0800  Location: Heel  Location Orientation: Right  Staging: Stage 3 -  Full thickness tissue loss. Subcutaneous fat may be visible but bone, tendon or muscle are NOT exposed.  Wound Description  (Comments):   Present on Admission: Yes     ---------------------------------------------------------------------------------------------------------------------------------------------------- Cultures; Blood Cultures x 2 >> NGT Sputum Culture >> NGT   Antimicrobials: -3-22 IV antibiotics of Rocephin, azithromycin  Consultants: None    Remains inpatient appropriate because:Hemodynamically unstable and Inpatient level of care appropriate due to severity of illness   Dispo: The patient is from: Home              Anticipated d/c is to: Home              Patient currently is not medically stable to d/c.   Difficult to place patient No      Level of care: Stepdown   Procedures:   No admission procedures for hospital encounter.     Antimicrobials:  Anti-infectives (From admission, onward)   Start     Dose/Rate Route Frequency Ordered Stop   07/24/20 1000  azithromycin (ZITHROMAX) tablet 500 mg        500 mg Oral Daily 07/24/20 0831 07/29/20 0959   07/23/20 1000  azithromycin (ZITHROMAX) 500 mg in sodium chloride 0.9 % 250 mL IVPB  Status:  Discontinued        500 mg 250 mL/hr over 60 Minutes Intravenous Every 24 hours 07/22/20 1220  07/24/20 0831   07/22/20 1230  cefTRIAXone (ROCEPHIN) 1 g in sodium chloride 0.9 % 100 mL IVPB        1 g 200 mL/hr over 30 Minutes Intravenous Every 24 hours 07/22/20 1220     07/22/20 0845  levofloxacin (LEVAQUIN) IVPB 500 mg        500 mg 100 mL/hr over 60 Minutes Intravenous  Once 07/22/20 0831 07/22/20 1022       Medication:  . aspirin EC  81 mg Oral Daily  . atorvastatin  40 mg Oral Daily  . azithromycin  500 mg Oral Daily  . budesonide (PULMICORT) nebulizer solution  0.5 mg Nebulization BID  . Chlorhexidine Gluconate Cloth  6 each Topical Daily  . enoxaparin (LOVENOX) injection  55 mg Subcutaneous Q24H  . furosemide  40 mg Intravenous Once  . gabapentin  600 mg Oral Q8H  . losartan  50 mg Oral Daily  . multivitamin with  minerals  1 tablet Oral Daily  . predniSONE  40 mg Oral Q breakfast  . sodium chloride flush  3 mL Intravenous Q12H  . vitamin B-12  1,000 mcg Oral Daily    sodium chloride, acetaminophen **OR** acetaminophen, ondansetron **OR** ondansetron (ZOFRAN) IV, oxyCODONE-acetaminophen **AND** oxyCODONE, sodium chloride, sodium chloride flush   Objective:   Vitals:   07/24/20 0705 07/24/20 0718 07/24/20 0800 07/24/20 1000  BP: (!) 171/77  (!) 172/65 (!) 162/97  Pulse: 85 83 92 98  Resp: (!) 22 (!) 21 (!) 23   Temp:  98.3 F (36.8 C)    TempSrc:  Oral    SpO2: 100% 96% 96% 100%  Weight:      Height:        Intake/Output Summary (Last 24 hours) at 07/24/2020 1006 Last data filed at 07/24/2020 1000 Gross per 24 hour  Intake 1449.9 ml  Output 600 ml  Net 849.9 ml   Filed Weights   07/22/20 1547 07/23/20 0424 07/24/20 0500  Weight: 115.4 kg 114.7 kg 113 kg     Examination:      Physical Exam:   General:  Alert, oriented, cooperative, no distress; on 4 L of oxygen  HEENT:  Normocephalic, PERRL, otherwise with in Normal limits   Neuro:  CNII-XII intact. , normal motor and sensation, reflexes intact   Lungs:    Improved respiratory effort, but continue to have shortness of breath, diffuse Rales, rhonchi improved wheezing  Cardio:    S1/S2, RRR, No murmure, No Rubs or Gallops   Abdomen:   Soft, non-tender, bowel sounds active all four quadrants,  no guarding or peritoneal signs.  Muscular skeletal:  Limited exam - in bed, able to move all 4 extremities, Normal strength,  2+ pulses,  symmetric, No pitting edema  Skin:  Dry, warm to touch, negative for any Rashes,  Wounds: Please see nursing documentation Pressure Injury 07/22/20 Buttocks Left Stage 2 -  Partial thickness loss of dermis presenting as a shallow open injury with a red, pink wound bed without slough. (Active)  07/22/20 1600  Location: Buttocks  Location Orientation: Left  Staging: Stage 2 -  Partial thickness loss of  dermis presenting as a shallow open injury with a red, pink wound bed without slough.  Wound Description (Comments):   Present on Admission: Yes     Pressure Injury 07/23/20 Heel Right Stage 3 -  Full thickness tissue loss. Subcutaneous fat may be visible but bone, tendon or muscle are NOT exposed. (Active)  07/23/20 0800  Location:  Heel  Location Orientation: Right  Staging: Stage 3 -  Full thickness tissue loss. Subcutaneous fat may be visible but bone, tendon or muscle are NOT exposed.  Wound Description (Comments):   Present on Admission: Yes        ---------------------------------------------------------------------------------------------------------------------------------------    LABs:  CBC Latest Ref Rng & Units 07/24/2020 07/23/2020 07/22/2020  WBC 4.0 - 10.5 K/uL 12.4(H) 14.7(H) 17.7(H)  Hemoglobin 13.0 - 17.0 g/dL 10.9(L) 10.6(L) 12.3(L)  Hematocrit 39.0 - 52.0 % 35.0(L) 33.7(L) 38.8(L)  Platelets 150 - 400 K/uL 343 340 358   CMP Latest Ref Rng & Units 07/24/2020 07/23/2020 07/22/2020  Glucose 70 - 99 mg/dL 102(H) 131(H) 109(H)  BUN 8 - 23 mg/dL 26(H) 21 17  Creatinine 0.61 - 1.24 mg/dL 0.83 0.74 1.00  Sodium 135 - 145 mmol/L 139 139 138  Potassium 3.5 - 5.1 mmol/L 4.0 4.1 4.0  Chloride 98 - 111 mmol/L 103 102 99  CO2 22 - 32 mmol/L $RemoveB'27 26 27  'QfTegmCZ$ Calcium 8.9 - 10.3 mg/dL 8.5(L) 8.4(L) 8.7(L)  Total Protein 6.5 - 8.1 g/dL 6.3(L) - 7.1  Total Bilirubin 0.3 - 1.2 mg/dL 0.5 - 0.6  Alkaline Phos 38 - 126 U/L 42 - 51  AST 15 - 41 U/L 29 - 30  ALT 0 - 44 U/L 33 - 28       Micro Results Recent Results (from the past 240 hour(s))  Resp Panel by RT-PCR (Flu A&B, Covid) Nasopharyngeal Swab     Status: None   Collection Time: 07/22/20  9:11 AM   Specimen: Nasopharyngeal Swab; Nasopharyngeal(NP) swabs in vial transport medium  Result Value Ref Range Status   SARS Coronavirus 2 by RT PCR NEGATIVE NEGATIVE Final    Comment: (NOTE) SARS-CoV-2 target nucleic acids are NOT  DETECTED.  The SARS-CoV-2 RNA is generally detectable in upper respiratory specimens during the acute phase of infection. The lowest concentration of SARS-CoV-2 viral copies this assay can detect is 138 copies/mL. A negative result does not preclude SARS-Cov-2 infection and should not be used as the sole basis for treatment or other patient management decisions. A negative result may occur with  improper specimen collection/handling, submission of specimen other than nasopharyngeal swab, presence of viral mutation(s) within the areas targeted by this assay, and inadequate number of viral copies(<138 copies/mL). A negative result must be combined with clinical observations, patient history, and epidemiological information. The expected result is Negative.  Fact Sheet for Patients:  EntrepreneurPulse.com.au  Fact Sheet for Healthcare Providers:  IncredibleEmployment.be  This test is no t yet approved or cleared by the [place] FDA and  has been authorized for detection and/or diagnosis of SARS-CoV-2 by FDA under an Emergency Use Authorization (EUA). This EUA will remain  in effect (meaning this test can be used) for the duration of the COVID-19 declaration under Section 564(b)(1) of the Act, 21 U.S.C.section 360bbb-3(b)(1), unless the authorization is terminated  or revoked sooner.       Influenza A by PCR NEGATIVE NEGATIVE Final   Influenza B by PCR NEGATIVE NEGATIVE Final    Comment: (NOTE) The Xpert Xpress SARS-CoV-2/FLU/RSV plus assay is intended as an aid in the diagnosis of influenza from Nasopharyngeal swab specimens and should not be used as a sole basis for treatment. Nasal washings and aspirates are unacceptable for Xpert Xpress SARS-CoV-2/FLU/RSV testing.  Fact Sheet for Patients: EntrepreneurPulse.com.au  Fact Sheet for Healthcare Providers: IncredibleEmployment.be  This test is not yet  approved or cleared by the [place] FDA  and has been authorized for detection and/or diagnosis of SARS-CoV-2 by FDA under an Emergency Use Authorization (EUA). This EUA will remain in effect (meaning this test can be used) for the duration of the COVID-19 declaration under Section 564(b)(1) of the Act, 21 U.S.C. section 360bbb-3(b)(1), unless the authorization is terminated or revoked.  Performed at [facility], [address] [place], [place] [address]   Culture, blood (routine x 2)     Status: None (Preliminary result)   Collection Time: 07/22/20 12:44 PM   Specimen: BLOOD  Result Value Ref Range Status   Specimen Description BLOOD BLOOD RIGHT HAND  Final   Special Requests   Final    Blood Culture adequate volume BOTTLES DRAWN AEROBIC AND ANAEROBIC   Culture   Final    NO GROWTH 2 DAYS Performed at [facility], [address], [place], [place] [address]    Report Status PENDING  Incomplete  Culture, blood (routine x 2)     Status: None (Preliminary result)   Collection Time: 07/22/20 12:53 PM   Specimen: BLOOD  Result Value Ref Range Status   Specimen Description BLOOD BLOOD LEFT HAND  Final   Special Requests   Final    Blood Culture adequate volume BOTTLES DRAWN AEROBIC AND ANAEROBIC   Culture   Final    NO GROWTH 2 DAYS Performed at [facility], [address], [place], [place] [address]    Report Status PENDING  Incomplete  MRSA PCR Screening     Status: None   Collection Time: 07/22/20  4:09 PM   Specimen: Nasal Mucosa; Nasopharyngeal  Result Value Ref Range Status   MRSA by PCR NEGATIVE NEGATIVE Final    Comment:        The GeneXpert MRSA Assay (FDA approved for NASAL specimens only), is one component of a comprehensive MRSA colonization surveillance program. It is not intended to diagnose MRSA infection nor to guide or monitor treatment for MRSA infections. Performed at [facility], [address] [place], [place] [address]     Radiology  Reports CT Chest W Contrast  Result Date: 07/22/2020 CLINICAL DATA:  Right upper lobe pulmonary nodule, pneumonia, productive cough and worsening shortness of breath for several days EXAM: CT CHEST WITH CONTRAST TECHNIQUE: Multidetector CT imaging of the chest was performed during intravenous contrast administration. CONTRAST:  51mL OMNIPAQUE IOHEXOL 300 MG/ML  SOLN COMPARISON:  07/22/2020, 06/18/2020 FINDINGS: Cardiovascular: The heart and great vessels are unremarkable without pericardial effusion. No evidence of thoracic aortic aneurysm or dissection. Moderate atherosclerosis of the aorta. Extensive atherosclerosis of the coronary vasculature, greatest in the LAD distribution. Mediastinum/Nodes: There is pathologic mediastinal and right hilar adenopathy. Enlarged precarinal lymph node measuring 14 mm image 62/2 and right hilar lymph node measuring 14 mm image 69/2. The thyroid and trachea are unremarkable. There is mild diffuse esophageal wall thickening, nonspecific. Lungs/Pleura: As seen on recent chest x-ray, there is a spiculated right upper lobe pulmonary nodule, measuring 17 x 15 by 16 mm reference image 50/5 and 109/6, highly concerning for malignancy. There is a smaller satellite nodule more inferiorly within the right upper lobe, measuring approximately 7 x 7 x 7 mm reference image 68/5 and 101/6, also suspicious for malignancy. Multifocal airspace disease is seen within the right middle and right lower lobes, with opacification of the distal bronchi. Overall favor pneumonia over aspiration. Upper lobe predominant emphysema again noted. No evidence of effusion or pneumothorax. Upper Abdomen: No acute abnormality. Musculoskeletal: No acute or destructive bony  lesions. Reconstructed images demonstrate no additional findings. IMPRESSION: 1. 16 mm mean diameter right upper lobe spiculated nodule, with smaller 7 mm spiculated satellite nodule in the right upper lobe, both concerning for malignancy. PET-CT  recommended for further evaluation. 2. Multifocal airspace disease within the right middle and right lower lobes, consistent with pneumonia. 3. Mediastinal and right hilar lymphadenopathy. Metastatic disease cannot be excluded. 4. Nonspecific diffuse esophageal wall thickening, which could reflect esophagitis. 5. Aortic Atherosclerosis (ICD10-I70.0) and Emphysema (ICD10-J43.9). Electronically Signed   By: [name] M.D.   On: 07/22/2020 15:37   DG Chest Portable 1 View  Result Date: 07/22/2020 CLINICAL DATA:  Shortness of breath for 2 days Hypertension COPD Former smoker EXAM: PORTABLE CHEST 1 VIEW COMPARISON:  06/18/2020 FINDINGS: Cardiomediastinal silhouette and pulmonary vasculature are within normal limits. New airspace opacity at the right lung base suspicious for pneumonia. Nodular opacity again seen in the right upper lung measuring 1.2 cm. IMPRESSION: 1. New airspace opacity at the right lung base suspicious for pneumonia. 2. 1.2 cm nodule again seen in the right upper lung. Further evaluation with contrast enhanced CT again recommended. These results will be called to the ordering clinician or representative by the Radiologist Assistant, and communication documented in the PACS or [employer]. Electronically Signed   By: Acquanetta Belling M.D.   On: 07/22/2020 08:16   ECHOCARDIOGRAM COMPLETE  Result Date: 07/22/2020    ECHOCARDIOGRAM REPORT   Patient Name:   Joel Perez Date of Exam: 07/22/2020 Medical Rec #:  [id number]     Height:       70.0 in Accession #:    [id number]    Weight:       244.0 lb Date of Birth:  03/22/1946     BSA:          2.271 m Patient Age:    74 years      BP:           105/55 mmHg Patient Gender: M             HR:           98 bpm. [facility] Procedure: 2D Echo Indications:    Elevated Troponin  History:        Patient has prior history of Echocardiogram examinations, most                 recent 12/27/2018. COPD; Risk Factors:Former Smoker. Sepsis,                  Chronic Respiratory Failure, LBBB.  Sonographer:    Jeryl Columbia RDCS (AE) Referring Phys: [phone] [facility] Dowdy [facility] IMPRESSIONS  1. Left ventricular ejection fraction, by estimation, is 60 to 65%. The left ventricle has normal function. The left ventricle has no regional wall motion abnormalities. There is mild left ventricular hypertrophy. Left ventricular diastolic parameters are indeterminate.  2. Right ventricular systolic function is normal. The right ventricular size is normal.  3. The mitral valve is normal in structure. No evidence of mitral valve regurgitation. No evidence of mitral stenosis.  4. The aortic valve was not well visualized. There is moderate calcification of the aortic valve. There is moderate thickening of the aortic valve. Aortic valve regurgitation is not visualized. Mild aortic valve stenosis.  5. The inferior vena cava is normal in size with greater than 50% respiratory variability, suggesting right atrial pressure of 3 mmHg. FINDINGS  Left Ventricle: Left ventricular ejection fraction, by estimation,  is 60 to 65%. The left ventricle has normal function. The left ventricle has no regional wall motion abnormalities. The left ventricular internal cavity size was normal in size. There is  mild left ventricular hypertrophy. Left ventricular diastolic parameters are indeterminate. Right Ventricle: The right ventricular size is normal. No increase in right ventricular wall thickness. Right ventricular systolic function is normal. Left Atrium: Left atrial size was normal in size. Right Atrium: Right atrial size was normal in size. Pericardium: There is no evidence of pericardial effusion. Mitral Valve: The mitral valve is normal in structure. No evidence of mitral valve regurgitation. No evidence of mitral valve stenosis. Tricuspid Valve: The tricuspid valve is normal in structure. Tricuspid valve regurgitation is not demonstrated. No evidence of tricuspid stenosis. Aortic Valve: The aortic  valve was not well visualized. There is moderate calcification of the aortic valve. There is moderate thickening of the aortic valve. There is moderate aortic valve annular calcification. Aortic valve regurgitation is not visualized. Mild aortic stenosis is present. Aortic valve mean gradient measures 12.5 mmHg. Aortic valve peak gradient measures 22.7 mmHg. Aortic valve area, by VTI measures 1.60 cm. Pulmonic Valve: The pulmonic valve was not well visualized. Pulmonic valve regurgitation is not visualized. No evidence of pulmonic stenosis. Aorta: The aortic root is normal in size and structure. Pulmonary Artery: Indeterminant PASP, inadequate TR jet. Venous: The inferior vena cava is normal in size with greater than 50% respiratory variability, suggesting right atrial pressure of 3 mmHg. IAS/Shunts: The interatrial septum was not well visualized.  LEFT VENTRICLE PLAX 2D LVIDd:         5.19 cm  Diastology LVIDs:         3.22 cm  LV e' medial:    9.79 cm/s LV PW:         1.02 cm  LV E/e' medial:  16.2 LV IVS:        1.10 cm  LV e' lateral:   17.80 cm/s LVOT diam:     2.20 cm  LV E/e' lateral: 8.9 LV SV:         75 LV SV Index:   33 LVOT Area:     3.80 cm  RIGHT VENTRICLE RV S prime:     16.80 cm/s TAPSE (M-mode): 3.0 cm LEFT ATRIUM             Index       RIGHT ATRIUM           Index LA diam:        3.60 cm 1.58 cm/m  RA Area:     13.90 cm LA Vol (A2C):   76.4 ml 33.64 ml/m RA Volume:   34.90 ml  15.36 ml/m LA Vol (A4C):   25.8 ml 11.36 ml/m LA Biplane Vol: 44.0 ml 19.37 ml/m  AORTIC VALVE AV Area (Vmax):    1.85 cm AV Area (Vmean):   1.85 cm AV Area (VTI):     1.60 cm AV Vmax:           238.47 cm/s AV Vmean:          162.164 cm/s AV VTI:            0.467 m AV Peak Grad:      22.7 mmHg AV Mean Grad:      12.5 mmHg LVOT Vmax:         115.78 cm/s LVOT Vmean:        78.954 cm/s LVOT VTI:  0.196 m LVOT/AV VTI ratio: 0.42  AORTA Ao Root diam: 2.70 cm MITRAL VALVE MV Area (PHT): 4.65 cm     SHUNTS MV  Decel Time: 163 msec     Systemic VTI:  0.20 m MV E velocity: 159.00 cm/s  Systemic Diam: 2.20 cm Carlyle Dolly MD Electronically signed by Carlyle Dolly MD Signature Date/Time: 07/22/2020/4:18:33 PM    Final     SIGNED: Deatra James, MD, FHM. [facility],  Pager (please use [email or web address] to page/text) Please use Epic Secure Chat for non-urgent communication (7AM-7PM)  If 7PM-7AM, please contact night-coverage [email or web address], 07/24/2020, 10:06 AM

## 2020-07-25 DIAGNOSIS — M7989 Other specified soft tissue disorders: Secondary | ICD-10-CM | POA: Diagnosis not present

## 2020-07-25 DIAGNOSIS — A419 Sepsis, unspecified organism: Secondary | ICD-10-CM | POA: Diagnosis not present

## 2020-07-25 DIAGNOSIS — L8995 Pressure ulcer of unspecified site, unstageable: Secondary | ICD-10-CM | POA: Diagnosis not present

## 2020-07-25 DIAGNOSIS — J449 Chronic obstructive pulmonary disease, unspecified: Secondary | ICD-10-CM | POA: Diagnosis not present

## 2020-07-25 LAB — CBC WITH DIFFERENTIAL/PLATELET
Abs Immature Granulocytes: 0.27 10*3/uL — ABNORMAL HIGH (ref 0.00–0.07)
Basophils Absolute: 0 10*3/uL (ref 0.0–0.1)
Basophils Relative: 0 %
Eosinophils Absolute: 0.1 10*3/uL (ref 0.0–0.5)
Eosinophils Relative: 1 %
HCT: 37.2 % — ABNORMAL LOW (ref 39.0–52.0)
Hemoglobin: 11.6 g/dL — ABNORMAL LOW (ref 13.0–17.0)
Immature Granulocytes: 2 %
Lymphocytes Relative: 18 %
Lymphs Abs: 2.2 10*3/uL (ref 0.7–4.0)
MCH: 31 pg (ref 26.0–34.0)
MCHC: 31.2 g/dL (ref 30.0–36.0)
MCV: 99.5 fL (ref 80.0–100.0)
Monocytes Absolute: 0.8 10*3/uL (ref 0.1–1.0)
Monocytes Relative: 7 %
Neutro Abs: 8.9 10*3/uL — ABNORMAL HIGH (ref 1.7–7.7)
Neutrophils Relative %: 72 %
Platelets: 366 10*3/uL (ref 150–400)
RBC: 3.74 MIL/uL — ABNORMAL LOW (ref 4.22–5.81)
RDW: 13.9 % (ref 11.5–15.5)
WBC: 12.3 10*3/uL — ABNORMAL HIGH (ref 4.0–10.5)
nRBC: 0 % (ref 0.0–0.2)

## 2020-07-25 LAB — COMPREHENSIVE METABOLIC PANEL
ALT: 59 U/L — ABNORMAL HIGH (ref 0–44)
AST: 43 U/L — ABNORMAL HIGH (ref 15–41)
Albumin: 3.2 g/dL — ABNORMAL LOW (ref 3.5–5.0)
Alkaline Phosphatase: 47 U/L (ref 38–126)
Anion gap: 11 (ref 5–15)
BUN: 23 mg/dL (ref 8–23)
CO2: 27 mmol/L (ref 22–32)
Calcium: 8.8 mg/dL — ABNORMAL LOW (ref 8.9–10.3)
Chloride: 103 mmol/L (ref 98–111)
Creatinine, Ser: 0.74 mg/dL (ref 0.61–1.24)
GFR, Estimated: >60 mL/min (ref >60)
Glucose, Bld: 84 mg/dL (ref 70–99)
Potassium: 3.6 mmol/L (ref 3.5–5.1)
Sodium: 141 mmol/L (ref 135–145)
Total Bilirubin: 0.5 mg/dL (ref 0.3–1.2)
Total Protein: 6.4 g/dL — ABNORMAL LOW (ref 6.5–8.1)

## 2020-07-25 LAB — BRAIN NATRIURETIC PEPTIDE: B Natriuretic Peptide: 123 pg/mL — ABNORMAL HIGH (ref 0.0–100.0)

## 2020-07-25 MED ORDER — LEVOFLOXACIN 500 MG PO TABS
750.0000 mg | ORAL_TABLET | Freq: Every day | ORAL | Status: DC
  Administered 2020-07-25 – 2020-07-26 (×2): 750 mg via ORAL
  Filled 2020-07-25 (×3): qty 2

## 2020-07-25 MED ORDER — RISAQUAD PO CAPS
1.0000 | ORAL_CAPSULE | Freq: Three times a day (TID) | ORAL | Status: DC
  Administered 2020-07-25 – 2020-07-26 (×3): 1 via ORAL
  Filled 2020-07-25 (×3): qty 1

## 2020-07-25 NOTE — Progress Notes (Signed)
PROGRESS NOTE    Patient: Joel Perez                            PCP: Tally Joe, MD                    DOB: 1946-04-12            DOA: 07/22/2020 YDB:[id number]             DOS: 07/25/2020, 11:56 AM   LOS: 3 days   Date of Service: The patient was seen and examined on 07/25/2020  Subjective:   The patient was seen and examined this morning, sitting up in a chair on 4 L of oxygen, complaining of shortness of breath especially worsening with exertion But admits he is improving from the time of admission Also complaining of not be able to sleep overnight Otherwise stable  Wife present at bedside -we discussed current finding and plan of care    Brief Narrative:   Joel Perez is a 75 y.o. male with medical history significant for chronic diastolic congestive heart failure, IVCD with LBBB, chronic venous insufficiency with stasis dermatitis, PAD, neuropathy, and COPD with chronic hypoxemia and steroid use who presented to the ED via EMS with worsening shortness of breath and cough for the last few days.  His cough has been productive of yellow sputum.  His symptoms seem to improve with breathing treatments, no particular aggravating factors are noted.  Usually he wears 4 L nasal cannula at home.  He denies any chest pain, fever, chills, abdominal pain, nausea, or vomiting.  He states that his legs are typically swollen.  He states that he has had his Covid vaccines administered as well.  ED Course:  Patient noted to have sinus tachycardia is also pressure readings have been 90 systolic range. Leukocytosis of over 17,000 noted and chest x-ray suggestive of right lower lung base pneumonia. Troponins 63 with follow-up troponin 75.  Procalcitonin 0.23 and BNP 107.  He was given some Levaquin and magnesium sulfate.  Unfortunately blood cultures were not collected, nor was a lactic acid collected.  Blood cultures have been collected after Levaquin was given in the ED.  Lactic acid was checked with  initial level being 3.3.  Fluid bolus of 30 cc/kg was administered with improvement in blood pressure and heart rate.  Patient currently without significant respiratory distress and not his usual 4 L nasal cannula.  Covid testing and respiratory panel negative.   Assessment & Plan:   Principal Problem:   Sepsis (HCC) Active Problems:   COPD III with marked reversibility   Leg swelling   PVD (peripheral vascular disease) (HCC)   Pressure injury of skin   Severe sepsis secondary to right lower lobe pneumonia -Improved sepsis physiology -Improved acute on chronic respiratory failure--back to baseline 4 L of O2  satting > 92 % -On admission met the sepsis criteria: With tachycardia, tachypnea, hypotension, respiratory distress, with a source of infection pneumonia -Hypotensive and developed hypertension, currently stable on meds Noted to have leukocytosis and lactic acidosis, procalcitonin only 0.23  -IV antibiotic switched to p.o. azithromycin, continue IV Rocephin -planning to switch to p.o. in a.m. -Blood cultures >> negative to date -Status post IV fluid resuscitation per sepsis protocol, discontinued now  -Covid testing negative,  -Urine Legionella and strep pneumonia -negative   Acute COPD exacerbation related to above -Acute on chronic respiratory failure due to  COPD exacerbation has improved [place] has improved, back to baseline 4 L of oxygen satting greater 90% -Still having shortness of breath especially with exertion, lower extremity edema -Lower extremity edema may be due to venous stasis-chronic  -Patient noted to have pulmonary nodule on chest x-ray, obtain CT chest with contrast as recommended -We will continue current antibiotics -Continue DuoNebs every 6 hours --due to tachycardia, will be switched to Xopenex and Atrovent -We will continue with IV Solu-Medrol twice daily >>> tapered to p.o. prednisone -Mucolytic's and antitussives  Elevated troponin -Likely  related to demand ischemia from sepsis physiology (troponin 38, 31,) -No EKG changes -Continues to deny any chest pain, -Check 2D echocardiogram: Reviewed -no significant changes from previous echo,  prior 12/2018 with LVEF 60-65% -Remained stable  History of chronic diastolic CHF -Patient given IV fluid bolus in ED, will need careful monitoring  -Check 2D echocardiogram as noted above -Was holding home medication of Lasix  -due to hypotension and sepsis physiology  -Monitor daily weights -Due to increased lower extremity edema, 1 dose of Lasix 40 mg will be given -Stable resuming home p.o. Lasix  Chronic venous insufficiency/PUD/neuropathy -Continue aspirin, statin, gabapentin -We will discontinue IV fluids -Noting chronic lower extremity +2 edema  History of hypertension -Continue to improve, -Restarting Lasix in a.m. (1 dose 40 mg IV today) -Elevated blood pressure now, restarting home dose of losartan  History of IVCD/LBBB -Noted on EKG  Obesity -BMI 35.01 kg/m -Lifestyle changes outpatient  DVT prophylaxis: Lovenox Code Status: Full Family Communication: with the Patient and his wife at bedside Disposition Plan: Admit for treatment of sepsis/pna and copd Consults called:None Admission status: Inpatient, SDU  ---------------------------------------------------------------------------------------------------------------------------------- Nutritional status:  The patient's BMI is: Body mass index is 35.27 kg/m. I agree with the assessment and plan as outlined ---  Skin Assessment: I have examined the patient's skin and I agree with the wound assessment as performed by wound care team As outlined belowe: Pressure Injury 07/22/20 Buttocks Left Stage 2 -  Partial thickness loss of dermis presenting as a shallow open injury with a red, pink wound bed without slough. (Active)  07/22/20 1600  Location: Buttocks  Location Orientation: Left  Staging: Stage 2 -   Partial thickness loss of dermis presenting as a shallow open injury with a red, pink wound bed without slough.  Wound Description (Comments):   Present on Admission: Yes     Pressure Injury 07/23/20 Heel Right Stage 3 -  Full thickness tissue loss. Subcutaneous fat may be visible but bone, tendon or muscle are NOT exposed. (Active)  07/23/20 0800  Location: Heel  Location Orientation: Right  Staging: Stage 3 -  Full thickness tissue loss. Subcutaneous fat may be visible but bone, tendon or muscle are NOT exposed.  Wound Description (Comments):   Present on Admission: Yes     ---------------------------------------------------------------------------------------------------------------------------------------------------- Cultures; Blood Cultures x 2 >> NGT Sputum Culture >> NGT   Antimicrobials: -3-22 IV antibiotics of Rocephin, azithromycin  Consultants: None    Remains inpatient appropriate because:Hemodynamically unstable and Inpatient level of care appropriate due to severity of illness   Dispo: The patient is from: Home              Anticipated d/c is to: Home with home health in a.m. if remains stable              Patient currently is not medically stable to d/c.   Difficult to place patient No      Level of  care: Telemetry   Procedures:   No admission procedures for hospital encounter.     Antimicrobials:  Anti-infectives (From admission, onward)   Start     Dose/Rate Route Frequency Ordered Stop   07/24/20 1000  azithromycin (ZITHROMAX) tablet 500 mg        500 mg Oral Daily 07/24/20 0831 07/29/20 0959   07/23/20 1000  azithromycin (ZITHROMAX) 500 mg in sodium chloride 0.9 % 250 mL IVPB  Status:  Discontinued        500 mg 250 mL/hr over 60 Minutes Intravenous Every 24 hours 07/22/20 1220 07/24/20 0831   07/22/20 1230  cefTRIAXone (ROCEPHIN) 1 g in sodium chloride 0.9 % 100 mL IVPB        1 g 200 mL/hr over 30 Minutes Intravenous Every 24 hours  07/22/20 1220     07/22/20 0845  levofloxacin (LEVAQUIN) IVPB 500 mg        500 mg 100 mL/hr over 60 Minutes Intravenous  Once 07/22/20 0831 07/22/20 1022       Medication:  . aspirin EC  81 mg Oral Daily  . atorvastatin  40 mg Oral Daily  . azithromycin  500 mg Oral Daily  . budesonide (PULMICORT) nebulizer solution  0.5 mg Nebulization BID  . Chlorhexidine Gluconate Cloth  6 each Topical Daily  . enoxaparin (LOVENOX) injection  55 mg Subcutaneous Q24H  . gabapentin  600 mg Oral Q8H  . losartan  50 mg Oral Daily  . multivitamin with minerals  1 tablet Oral Daily  . predniSONE  40 mg Oral Q breakfast  . sodium chloride flush  3 mL Intravenous Q12H  . vitamin B-12  1,000 mcg Oral Daily    sodium chloride, acetaminophen **OR** acetaminophen, ondansetron **OR** ondansetron (ZOFRAN) IV, oxyCODONE-acetaminophen **AND** oxyCODONE, sodium chloride, sodium chloride flush   Objective:   Vitals:   07/25/20 0600 07/25/20 0741 07/25/20 0835 07/25/20 0957  BP: (!) 149/73 (!) 147/71  136/75  Pulse: 88 93  97  Resp: 18   18  Temp: (!) 97.3 F (36.3 C)   98.6 F (37 C)  TempSrc:      SpO2: 99%  97% 100%  Weight:      Height:        Intake/Output Summary (Last 24 hours) at 07/25/2020 1156 Last data filed at 07/25/2020 0850 Gross per 24 hour  Intake 1150 ml  Output 1225 ml  Net -75 ml   Filed Weights   07/23/20 0424 07/24/20 0500 07/25/20 0500  Weight: 114.7 kg 113 kg 111.5 kg     Examination:      Physical Exam:   General:  Alert, oriented, cooperative, complain of shortness of breath  HEENT:  Normocephalic, PERRL, otherwise with in Normal limits   Neuro:  CNII-XII intact. , normal motor and sensation, reflexes intact   Lungs:    Diffuse wheezing, rhonchi improving, never any crackers  Cardio:    S1/S2, RRR, No murmure, No Rubs or Gallops   Abdomen:   Soft, non-tender, bowel sounds active all four quadrants,  no guarding or peritoneal signs.  Muscular skeletal:  Limited  exam - in bed, able to move all 4 extremities, Normal strength,  2+ pulses,  symmetric, +2 bilateral lower extremity edema  Skin:  Dry, warm to touch, negative for any Rashes, lower extremity edema, bilateral heel pressure ulcers  Wounds: Please see nursing documentation Pressure Injury 07/22/20 Buttocks Left Stage 2 -  Partial thickness loss of dermis presenting as a shallow  open injury with a red, pink wound bed without slough. (Active)  07/22/20 1600  Location: Buttocks  Location Orientation: Left  Staging: Stage 2 -  Partial thickness loss of dermis presenting as a shallow open injury with a red, pink wound bed without slough.  Wound Description (Comments):   Present on Admission: Yes     Pressure Injury 07/23/20 Heel Right Stage 3 -  Full thickness tissue loss. Subcutaneous fat may be visible but bone, tendon or muscle are NOT exposed. (Active)  07/23/20 0800  Location: Heel  Location Orientation: Right  Staging: Stage 3 -  Full thickness tissue loss. Subcutaneous fat may be visible but bone, tendon or muscle are NOT exposed.  Wound Description (Comments):   Present on Admission: Yes           ---------------------------------------------------------------------------------------------------------------------------------------    LABs:  CBC Latest Ref Rng & Units 07/25/2020 07/24/2020 07/23/2020  WBC 4.0 - 10.5 K/uL 12.3(H) 12.4(H) 14.7(H)  Hemoglobin 13.0 - 17.0 g/dL 11.6(L) 10.9(L) 10.6(L)  Hematocrit 39.0 - 52.0 % 37.2(L) 35.0(L) 33.7(L)  Platelets 150 - 400 K/uL 366 343 340   CMP Latest Ref Rng & Units 07/25/2020 07/24/2020 07/23/2020  Glucose 70 - 99 mg/dL 84 102(H) 131(H)  BUN 8 - 23 mg/dL 23 26(H) 21  Creatinine 0.61 - 1.24 mg/dL 0.74 0.83 0.74  Sodium 135 - 145 mmol/L 141 139 139  Potassium 3.5 - 5.1 mmol/L 3.6 4.0 4.1  Chloride 98 - 111 mmol/L 103 103 102  CO2 22 - 32 mmol/L _0 Calcium 8.9 - 10.3 mg/dL 8.8(L) 8.5(L) 8.4(L)  Total Protein 6.5 - 8.1 g/dL 6.4(L)  6.3(L) -  Total Bilirubin 0.3 - 1.2 mg/dL 0.5 0.5 -  Alkaline Phos 38 - 126 U/L 47 42 -  AST 15 - 41 U/L 43(H) 29 -  ALT 0 - 44 U/L 59(H) 33 -       Micro Results Recent Results (from the past 240 hour(s))  Resp Panel by RT-PCR (Flu A&B, Covid) Nasopharyngeal Swab     Status: None   Collection Time: 07/22/20  9:11 AM   Specimen: Nasopharyngeal Swab; Nasopharyngeal(NP) swabs in vial transport medium  Result Value Ref Range Status   SARS Coronavirus 2 by RT PCR NEGATIVE NEGATIVE Final    Comment: (NOTE) SARS-CoV-2 target nucleic acids are NOT DETECTED.  The SARS-CoV-2 RNA is generally detectable in upper respiratory specimens during the acute phase of infection. The lowest concentration of SARS-CoV-2 viral copies this assay can detect is 138 copies/mL. A negative result does not preclude SARS-Cov-2 infection and should not be used as the sole basis for treatment or other patient management decisions. A negative result may occur with  improper specimen collection/handling, submission of specimen other than nasopharyngeal swab, presence of viral mutation(s) within the areas targeted by this assay, and inadequate number of viral copies(<138 copies/mL). A negative result must be combined with clinical observations, patient history, and epidemiological information. The expected result is Negative.  Fact Sheet for Patients:  EntrepreneurPulse.com.au  Fact Sheet for Healthcare Providers:  IncredibleEmployment.be  This test is no t yet approved or cleared by the [place] FDA and  has been authorized for detection and/or diagnosis of SARS-CoV-2 by FDA under an Emergency Use Authorization (EUA). This EUA will remain  in effect (meaning this test can be used) for the duration of the COVID-19 declaration under Section 564(b)(1) of the Act, 21 U.S.C.section 360bbb-3(b)(1), unless the authorization is terminated  or revoked sooner.  Influenza A by PCR NEGATIVE NEGATIVE Final   Influenza B by PCR NEGATIVE NEGATIVE Final    Comment: (NOTE) The Xpert Xpress SARS-CoV-2/FLU/RSV plus assay is intended as an aid in the diagnosis of influenza from Nasopharyngeal swab specimens and should not be used as a sole basis for treatment. Nasal washings and aspirates are unacceptable for Xpert Xpress SARS-CoV-2/FLU/RSV testing.  Fact Sheet for Patients: EntrepreneurPulse.com.au  Fact Sheet for Healthcare Providers: IncredibleEmployment.be  This test is not yet approved or cleared by the [place] FDA and has been authorized for detection and/or diagnosis of SARS-CoV-2 by FDA under an Emergency Use Authorization (EUA). This EUA will remain in effect (meaning this test can be used) for the duration of the COVID-19 declaration under Section 564(b)(1) of the Act, 21 U.S.C. section 360bbb-3(b)(1), unless the authorization is terminated or revoked.  Performed at [facility], [address] [place], [place] [address]   Culture, blood (routine x 2)     Status: None (Preliminary result)   Collection Time: 07/22/20 12:44 PM   Specimen: BLOOD  Result Value Ref Range Status   Specimen Description BLOOD BLOOD RIGHT HAND  Final   Special Requests   Final    Blood Culture adequate volume BOTTLES DRAWN AEROBIC AND ANAEROBIC   Culture   Final    NO GROWTH 2 DAYS Performed at [facility], [address] [place], [place] [address]    Report Status PENDING  Incomplete  Culture, blood (routine x 2)     Status: None (Preliminary result)   Collection Time: 07/22/20 12:53 PM   Specimen: BLOOD  Result Value Ref Range Status   Specimen Description BLOOD BLOOD LEFT HAND  Final   Special Requests   Final    Blood Culture adequate volume BOTTLES DRAWN AEROBIC AND ANAEROBIC   Culture   Final    NO GROWTH 2 DAYS Performed at [facility], [address] [place], [place] [address]    Report Status PENDING   Incomplete  MRSA PCR Screening     Status: None   Collection Time: 07/22/20  4:09 PM   Specimen: Nasal Mucosa; Nasopharyngeal  Result Value Ref Range Status   MRSA by PCR NEGATIVE NEGATIVE Final    Comment:        The GeneXpert MRSA Assay (FDA approved for NASAL specimens only), is one component of a comprehensive MRSA colonization surveillance program. It is not intended to diagnose MRSA infection nor to guide or monitor treatment for MRSA infections. Performed at [facility], [address] [place], [place] [address]     Radiology Reports CT Chest W Contrast  Result Date: 07/22/2020 CLINICAL DATA:  Right upper lobe pulmonary nodule, pneumonia, productive cough and worsening shortness of breath for several days EXAM: CT CHEST WITH CONTRAST TECHNIQUE: Multidetector CT imaging of the chest was performed during intravenous contrast administration. CONTRAST:  26m OMNIPAQUE IOHEXOL 300 MG/ML  SOLN COMPARISON:  07/22/2020, 06/18/2020 FINDINGS: Cardiovascular: The heart and great vessels are unremarkable without pericardial effusion. No evidence of thoracic aortic aneurysm or dissection. Moderate atherosclerosis of the aorta. Extensive atherosclerosis of the coronary vasculature, greatest in the LAD distribution. Mediastinum/Nodes: There is pathologic mediastinal and right hilar adenopathy. Enlarged precarinal lymph node measuring 14 mm image 62/2 and right hilar lymph node measuring 14 mm image 69/2. The thyroid and trachea are unremarkable. There is mild diffuse esophageal wall thickening, nonspecific. Lungs/Pleura: As seen on recent chest x-ray, there is a spiculated right upper lobe pulmonary nodule, measuring 17 x  15 by 16 mm reference image 50/5 and 109/6, highly concerning for malignancy. There is a smaller satellite nodule more inferiorly within the right upper lobe, measuring approximately 7 x 7 x 7 mm reference image 68/5 and 101/6, also suspicious for malignancy. Multifocal airspace  disease is seen within the right middle and right lower lobes, with opacification of the distal bronchi. Overall favor pneumonia over aspiration. Upper lobe predominant emphysema again noted. No evidence of effusion or pneumothorax. Upper Abdomen: No acute abnormality. Musculoskeletal: No acute or destructive bony lesions. Reconstructed images demonstrate no additional findings. IMPRESSION: 1. 16 mm mean diameter right upper lobe spiculated nodule, with smaller 7 mm spiculated satellite nodule in the right upper lobe, both concerning for malignancy. PET-CT recommended for further evaluation. 2. Multifocal airspace disease within the right middle and right lower lobes, consistent with pneumonia. 3. Mediastinal and right hilar lymphadenopathy. Metastatic disease cannot be excluded. 4. Nonspecific diffuse esophageal wall thickening, which could reflect esophagitis. 5. Aortic Atherosclerosis (ICD10-I70.0) and Emphysema (ICD10-J43.9). Electronically Signed   By: Randa Ngo M.D.   On: 07/22/2020 15:37   DG Chest Portable 1 View  Result Date: 07/22/2020 CLINICAL DATA:  Shortness of breath for 2 days Hypertension COPD Former smoker EXAM: PORTABLE CHEST 1 VIEW COMPARISON:  06/18/2020 FINDINGS: Cardiomediastinal silhouette and pulmonary vasculature are within normal limits. New airspace opacity at the right lung base suspicious for pneumonia. Nodular opacity again seen in the right upper lung measuring 1.2 cm. IMPRESSION: 1. New airspace opacity at the right lung base suspicious for pneumonia. 2. 1.2 cm nodule again seen in the right upper lung. Further evaluation with contrast enhanced CT again recommended. These results will be called to the ordering clinician or representative by the Radiologist Assistant, and communication documented in the PACS or [employer]. Electronically Signed   By: Miachel Roux M.D.   On: 07/22/2020 08:16   ECHOCARDIOGRAM COMPLETE  Result Date: 07/22/2020    ECHOCARDIOGRAM REPORT    Patient Name:   [name] Date of Exam: 07/22/2020 Medical Rec #:  [id number]     Height:       70.0 in Accession #:    [id number]    Weight:       244.0 lb Date of Birth:  [birth year]     BSA:          2.271 m Patient Age:    37 years      BP:           105/55 mmHg Patient Gender: M             HR:           98 bpm. Exam Location:  [facility] Procedure: 2D Echo Indications:    Elevated Troponin  History:        Patient has prior history of Echocardiogram examinations, most                 recent 12/27/2018. COPD; Risk Factors:Former Smoker. Sepsis,                 Chronic Respiratory Failure, LBBB.  Sonographer:    Leavy Cella RDCS (AE) Referring Phys: [phone] Royanne Foots Cooter  1. Left ventricular ejection fraction, by estimation, is 60 to 65%. The left ventricle has normal function. The left ventricle has no regional wall motion abnormalities. There is mild left ventricular hypertrophy. Left ventricular diastolic parameters are indeterminate.  2. Right ventricular systolic function is normal. The right ventricular size  is normal.  3. The mitral valve is normal in structure. No evidence of mitral valve regurgitation. No evidence of mitral stenosis.  4. The aortic valve was not well visualized. There is moderate calcification of the aortic valve. There is moderate thickening of the aortic valve. Aortic valve regurgitation is not visualized. Mild aortic valve stenosis.  5. The inferior vena cava is normal in size with greater than 50% respiratory variability, suggesting right atrial pressure of 3 mmHg. FINDINGS  Left Ventricle: Left ventricular ejection fraction, by estimation, is 60 to 65%. The left ventricle has normal function. The left ventricle has no regional wall motion abnormalities. The left ventricular internal cavity size was normal in size. There is  mild left ventricular hypertrophy. Left ventricular diastolic parameters are indeterminate. Right Ventricle: The right ventricular size is normal.  No increase in right ventricular wall thickness. Right ventricular systolic function is normal. Left Atrium: Left atrial size was normal in size. Right Atrium: Right atrial size was normal in size. Pericardium: There is no evidence of pericardial effusion. Mitral Valve: The mitral valve is normal in structure. No evidence of mitral valve regurgitation. No evidence of mitral valve stenosis. Tricuspid Valve: The tricuspid valve is normal in structure. Tricuspid valve regurgitation is not demonstrated. No evidence of tricuspid stenosis. Aortic Valve: The aortic valve was not well visualized. There is moderate calcification of the aortic valve. There is moderate thickening of the aortic valve. There is moderate aortic valve annular calcification. Aortic valve regurgitation is not visualized. Mild aortic stenosis is present. Aortic valve mean gradient measures 12.5 mmHg. Aortic valve peak gradient measures 22.7 mmHg. Aortic valve area, by VTI measures 1.60 cm. Pulmonic Valve: The pulmonic valve was not well visualized. Pulmonic valve regurgitation is not visualized. No evidence of pulmonic stenosis. Aorta: The aortic root is normal in size and structure. Pulmonary Artery: Indeterminant PASP, inadequate TR jet. Venous: The inferior vena cava is normal in size with greater than 50% respiratory variability, suggesting right atrial pressure of 3 mmHg. IAS/Shunts: The interatrial septum was not well visualized.  LEFT VENTRICLE PLAX 2D LVIDd:         5.19 cm  Diastology LVIDs:         3.22 cm  LV e' medial:    9.79 cm/s LV PW:         1.02 cm  LV E/e' medial:  16.2 LV IVS:        1.10 cm  LV e' lateral:   17.80 cm/s LVOT diam:     2.20 cm  LV E/e' lateral: 8.9 LV SV:         75 LV SV Index:   33 LVOT Area:     3.80 cm  RIGHT VENTRICLE RV S prime:     16.80 cm/s TAPSE (M-mode): 3.0 cm LEFT ATRIUM             Index       RIGHT ATRIUM           Index LA diam:        3.60 cm 1.58 cm/m  RA Area:     13.90 cm LA Vol (A2C):    76.4 ml 33.64 ml/m RA Volume:   34.90 ml  15.36 ml/m LA Vol (A4C):   25.8 ml 11.36 ml/m LA Biplane Vol: 44.0 ml 19.37 ml/m  AORTIC VALVE AV Area (Vmax):    1.85 cm AV Area (Vmean):   1.85 cm AV Area (VTI):     1.60 cm  AV Vmax:           238.47 cm/s AV Vmean:          162.164 cm/s AV VTI:            0.467 m AV Peak Grad:      22.7 mmHg AV Mean Grad:      12.5 mmHg LVOT Vmax:         115.78 cm/s LVOT Vmean:        78.954 cm/s LVOT VTI:          0.196 m LVOT/AV VTI ratio: 0.42  AORTA Ao Root diam: 2.70 cm MITRAL VALVE MV Area (PHT): 4.65 cm     SHUNTS MV Decel Time: 163 msec     Systemic VTI:  0.20 m MV E velocity: 159.00 cm/s  Systemic Diam: 2.20 cm Carlyle Dolly MD Electronically signed by Carlyle Dolly MD Signature Date/Time: 07/22/2020/4:18:33 PM    Final     SIGNED: Deatra James, MD, FHM. [facility],  Pager (please use [email or web address] to page/text) Please use Epic Secure Chat for non-urgent communication (7AM-7PM)  If 7PM-7AM, please contact night-coverage [email or web address], 07/25/2020, 11:56 AM

## 2020-07-25 NOTE — Progress Notes (Signed)
Physical Therapy Treatment Patient Details Name: Joel Perez MRN: [id number] DOB: [birth year] Today's Date: 07/25/2020    History of Present Illness Joel Perez is a 75 y.o. male with medical history significant for chronic diastolic congestive heart failure, IVCD with LBBB, chronic venous insufficiency with stasis dermatitis, PAD, neuropathy, and COPD with chronic hypoxemia and steroid use who presented to the ED via EMS with worsening shortness of breath and cough for the last few days.  His cough has been productive of yellow sputum.  His symptoms seem to improve with breathing treatments, no particular aggravating factors are noted.  Usually he wears 4 L nasal cannula at home.  He denies any chest pain, fever, chills, abdominal pain, nausea, or vomiting.  He states that his legs are typically swollen.  He states that he has had his Covid vaccines administered as well.    PT Comments    Patient seated in chair at beginning of session. Patient performs seated exercises with good sitting balance seated in chair. Patient fatigued following seated exercises with intermittent coughing and SOB. Patient showing good sitting balance while completing breathing treatment with respiratory therapist and is agreeable for mobility following. Patient transfers to standing initially reaching for RW for support and ambulates around in room. Patient limited by increasing SOB and requests to return to seated. After sitting, patient requests to use bathroom where he transfers to standing without AD and ambulates without AD and is given min guard assist for safety as he ambulates to commode. Patient ends session seated on commode - RN notified. Patient will benefit from continued physical therapy in hospital and recommended venue below to increase strength, balance, endurance for safe ADLs and gait.    Follow Up Recommendations  Home health PT;Supervision for mobility/OOB;Supervision - Intermittent     Equipment  Recommendations  None recommended by PT    Recommendations for Other Services       Precautions / Restrictions Precautions Precautions: Fall Restrictions Weight Bearing Restrictions: No    Mobility  Bed Mobility               General bed mobility comments: seated in chair at beginning of session    Transfers Overall transfer level: Needs assistance Equipment used: Rolling walker (2 wheeled);None Transfers: Sit to/from [employer] to Stand: Min guard Stand pivot transfers: Min guard       General transfer comment: increased time, labored movement, 1x RW, 1x No AD for transfer  Ambulation/Gait Ambulation/Gait assistance: Min guard Gait Distance (Feet): 20 Feet Assistive device: Rolling walker (2 wheeled);None Gait Pattern/deviations: Decreased step length - right;Decreased step length - left;Decreased stride length Gait velocity: decreased   General Gait Details: labored steps without loss of balance in room and bathroom limited mostly due to SOB   Stairs             Wheelchair Mobility    Modified Rankin (Stroke Patients Only)       Balance Overall balance assessment: Needs assistance Sitting-balance support: Feet supported;No upper extremity supported Sitting balance-Leahy Scale: Good Sitting balance - Comments: EOB   Standing balance support: During functional activity;No upper extremity supported Standing balance-Leahy Scale: Fair Standing balance comment: without AD                            Cognition Arousal/Alertness: Awake/alert Behavior During Therapy: WFL for tasks assessed/performed Overall Cognitive Status: Within Functional Limits for tasks assessed  Exercises General Exercises - Lower Extremity Long Arc Quad: AROM;Both;10 reps;Seated Hip Flexion/Marching: AROM;Both;10 reps;Seated Toe Raises: AROM;Both;10 reps;Seated Heel Raises:  AROM;Both;10 reps;Seated    General Comments        Pertinent Vitals/Pain Pain Assessment: No/denies pain    Home Living                      Prior Function            PT Goals (current goals can now be found in the care plan section) Acute Rehab PT Goals Patient Stated Goal: return home with family to assist PT Goal Formulation: With patient Time For Goal Achievement: 07/31/20 Potential to Achieve Goals: Good Progress towards PT goals: Progressing toward goals    Frequency    Min 3X/week      PT Plan Current plan remains appropriate    Co-evaluation              AM-PAC PT "6 Clicks" Mobility   Outcome Measure  Help needed turning from your back to your side while in a flat bed without using bedrails?: A Little Help needed moving from lying on your back to sitting on the side of a flat bed without using bedrails?: A Lot Help needed moving to and from a bed to a chair (including a wheelchair)?: A Little Help needed standing up from a chair using your arms (e.g., wheelchair or bedside chair)?: None Help needed to walk in hospital room?: A Little Help needed climbing 3-5 steps with a railing? : A Lot 6 Click Score: 17    End of Session Equipment Utilized During Treatment: Oxygen Activity Tolerance: Patient tolerated treatment well;Patient limited by fatigue Patient left: with call bell/phone within reach;Other (comment) (left sitting on commode) Nurse Communication: Mobility status;Other (comment) (patient on commode) PT Visit Diagnosis: Unsteadiness on feet (R26.81);Other abnormalities of gait and mobility (R26.89);Muscle weakness (generalized) (M62.81)     Time: 6962-9528 PT Time Calculation (min) (ACUTE ONLY): 21 min  Charges:  $Therapeutic Exercise: 8-22 mins                     10:23 AM, 07/25/20 Mearl Latin PT, DPT Physical Therapist at [facility]

## 2020-07-25 NOTE — Progress Notes (Signed)
Patient ambulated around the room with no assistance.

## 2020-07-25 NOTE — Care Management Important Message (Signed)
Important Message  Patient Details  Name: Joel Perez MRN: [id number] Date of Birth: 1945/06/02   Medicare Important Message Given:  Yes     [name] 07/25/2020, 2:21 PM

## 2020-07-26 DIAGNOSIS — A419 Sepsis, unspecified organism: Secondary | ICD-10-CM | POA: Diagnosis not present

## 2020-07-26 DIAGNOSIS — J189 Pneumonia, unspecified organism: Secondary | ICD-10-CM

## 2020-07-26 DIAGNOSIS — J449 Chronic obstructive pulmonary disease, unspecified: Secondary | ICD-10-CM | POA: Diagnosis not present

## 2020-07-26 DIAGNOSIS — M7989 Other specified soft tissue disorders: Secondary | ICD-10-CM | POA: Diagnosis not present

## 2020-07-26 LAB — BRAIN NATRIURETIC PEPTIDE: B Natriuretic Peptide: 71 pg/mL (ref 0.0–100.0)

## 2020-07-26 MED ORDER — CEFDINIR 300 MG PO CAPS: 300.0000 mg | ORAL_CAPSULE | Freq: Two times a day (BID) | ORAL | 0 refills | Status: AC

## 2020-07-26 MED ORDER — PREDNISONE 20 MG PO TABS: ORAL_TABLET | ORAL | 0 refills | Status: DC

## 2020-07-26 NOTE — Discharge Summary (Signed)
Physician Discharge Summary  Joel Perez [id number] DOB: 1946-03-02 DOA: 07/22/2020  PCP: [name], MD  Admit date: 07/22/2020 Discharge date: 07/26/2020  Time spent: 35 minutes  Recommendations for Outpatient Follow-up:  1. Arrange AV failure to pulmonologist increase p.o. to continue taking care long-term of his COPD. 2. Repeat basic metabolic panel to follow lites and renal function 3. Repeat chest x-ray 8 in 6 weeks to assure complete resolution of infiltrates.   Discharge Diagnoses:  Principal Problem:   Sepsis ([place]) Active Problems:   COPD III with marked reversibility   Leg swelling   PVD (peripheral vascular disease) ([facility])   Pressure injury of skin   Community acquired pneumonia of right lower lobe of lung   Discharge Condition: Stable and improved.  Discharge back Home with home health services and instruction to follow-up with PCP in 10 days.  CODE STATUS: Full code.  Diet recommendation: Heart healthy/low-sodium diet; low calorie diet also recommended.  Filed Weights   07/24/20 0500 07/25/20 0500 07/26/20 0500  Weight: 113 kg 111.5 kg 111.6 kg    History of present illness:  Joel Brabson Bakeris a 75 y.o.malewith medical history significant forchronic diastolic congestive heart failure, IVCD with LBBB, chronic venous insufficiency with stasis dermatitis, PAD, neuropathy, and COPD with chronic hypoxemia and steroid use who presented to the ED via EMS with worsening shortness of breath and cough for the last few days. His cough has been productive of yellow sputum. His symptoms seem to improve with breathing treatments, no particular aggravating factors are noted. Usually he wears 4 L nasal cannula at home. He denies any chest pain, fever, chills, abdominal pain, nausea, or vomiting. He states that his legs are typically swollen. He states that he has had his Covid vaccines administered as well.  ED Course: Patient noted to have sinus tachycardia is also  pressure readings have been 90 systolic range. Leukocytosis of over 17,000 noted and chest x-ray suggestive of right lower lung base pneumonia. Troponins 63 with follow-up troponin 75. Procalcitonin 0.23 and BNP 107. He was given some Levaquin and magnesium sulfate. Unfortunately blood cultures were not collected, nor was alactic acid collected. Blood cultures have been collected afterLevaquin was given in the ED. Lactic acid was checked with initial level being 3.3. Fluid bolus of 30 cc/kg was administered with improvement in blood pressure and heart rate. Patient currently without significant respiratory distress and not his usual 4 L nasal cannula. Covid testing and respiratory panel negative.  Hospital Course:  Severe sepsis secondary to right lower lobe pneumonia -Improved sepsis physiology -Improved acute on chronic respiratory failure--back to baseline 4 L of O2  satting > 92 % -On admission met the sepsis criteria: With tachycardia, tachypnea, hypotension, respiratory distress, with a source of infection pneumonia -Hypotensive and developed hypertension, currently stable on meds Noted to have leukocytosis and lactic acidosis, procalcitonin 0.23 -IV antibiotic switched to p.o. cefdinir at discharge to complete therapy. -Blood cultures >> negative to date -Status post IV fluid resuscitation per sepsis protocol, discontinued now -Covid test negative,  -Urine Legionella and strep pneumonia -negative  Acute COPD exacerbation related to above -Acute on chronic respiratory failure due to COPD exacerbation has improved Air flow has improved, back to baseline 4 L of oxygen satting greater 90% -Still having shortness of breath especially with exertion (but speaking in full sentences and ready to go home), lower extremity edema -Lower extremity edema may be due to venous stasis-chronic -Patient noted to have pulmonary nodule on chest x-ray,  obtain CT chest with contrast as recommended  in 3-6 months. -Will continue current antibiotics and steroids tapering. -Continue bronchodilators/inhaler regimen and outpatient follow up with pulmonologist as an outpatient. -continue Mucolytic's and antitussives -Continue the use of flutter valve and incentive spirometer as discussed.  Elevated troponin -Likely related to demand ischemia from sepsis physiology (troponin 38, 31,) -No EKG changes on telemetry and EKG -Check 2D echocardiogram: Reviewed -no significant changes from previous echo, prior 12/2018 with LVEF 60-65% -Remained stable -No chest pain continue  History of chronic diastolic CHF -Patient given IV fluid bolus in ED, well tolerated. -2D echo without acute changes from prior evaluation.  Please see if ejection fraction and normal motion abnormalities appreciated. -Continue home dose of Lasix -Continue low-sodium diet and daily weight -Continue outpatient follow-up with cardiology service.  Chronic venous insufficiency/PUD/neuropathy/chronic lower extremity pressure injury) dermatitis appreciated pain -Continue aspirin, statin and gabapentin -Continue wound care as instructed. -leg elevation, lasix and low sodium diet discussed with patient.  History of hypertension -Stable -Advised to follow low-sodium diet -Continue home antihypertensive regimen.  History of IVCD/LBBB -Outpatient follow-up with cardiology service -No palpitations, lightheadedness or dizziness reported.  Class II obesity -BMI 35.01 kg/m -Lifestyle changes, low calorie diet, portion control increase physical activity discussed with patient.   Procedures:  See below for x-ray reports.  Consultations:  None  Discharge Exam: Vitals:   07/26/20 0739 07/26/20 1400  BP:  127/62  Pulse:  97  Resp:  19  Temp:  98.7 F (37.1 C)  SpO2: 95% 96%    General: Afebrile, no chest pain, no nausea or vomiting.  Back to his 4 L nasal cannula supplementation unable to speak in full  sentences.  Reports feeling weak, but ready to go home. Cardiovascular: Rate controlled, no rubs, no gallops, unable to properly assess JVD due to body habitus. Respiratory: Improved air movement bilaterally.  Positive rhonchi; mild expiratory wheezing.  No using accessory muscle. Abdomen: Obese, soft, nontender, nondistended, positive bowel sounds. Extremities: No cyanosis or clubbing. Skin: Stage II pressure injury in his left buttocks, present on admission.  No signs of superimposed infection.  Also with a stage III right heel pressure injury: Present on admission; no signs of superimposed infection.  No active drainage.  Discharge Instructions   Discharge Instructions    Diet - low sodium heart healthy   Complete by: As directed    Discharge instructions   Complete by: As directed    Take medications as prescribed Maintain adequate hydration Arrange follow up with PCP in 10 days Increase activity slowly, but steadily (pace yourself) Follow low sodium and low calorie diet.   Discharge wound care:   Complete by: As directed    Apply xeroform gauze to bilateral leg weeping areas on daily basis and cover them with ABD pads and kerlex. Apply a piece of aquacel to right heel daily and then cover with foam dressing. (change every 3 days or PRN soiling). uncomfortable abdomen ANTIBIOTICS   Increase activity slowly   Complete by: As directed      Allergies as of 07/26/2020      Reactions   Other Swelling, Other (See Comments)   Farmed Fish (tightness in throat & lip swelling)   Codeine Rash   Sulfa Antibiotics Hives      Medication List    STOP taking these medications   ibuprofen 200 MG tablet Commonly known as: ADVIL     TAKE these medications   albuterol (2.5 MG/3ML) 0.083% nebulizer solution  Commonly known as: PROVENTIL USE 1 VIAL IN NEBULIZER EVERY 6 HOURS AS NEEDED FOR WHEEZING AND FOR SHORTNESS OF BREATH What changed: See the new instructions.   ProAir HFA 108 (90  Base) MCG/ACT inhaler Generic drug: albuterol INHALE 2 PUFFS BY MOUTH EVERY 6 HOURS AS NEEDED FOR WHEEZING FOR SHORTNESS OF BREATH What changed: See the new instructions.   aspirin EC 81 MG tablet Take 81 mg by mouth daily. Swallow whole.   atorvastatin 40 MG tablet Commonly known as: LIPITOR TAKE 1 TABLET BY MOUTH ONCE DAILY . APPOINTMENT REQUIRED FOR FUTURE REFILLS What changed: See the new instructions.   Breztri Aerosphere 160-9-4.8 MCG/ACT Aero Generic drug: Budeson-Glycopyrrol-Formoterol INHALE 2 PUFFS INTO LUNGS TWICE DAILY What changed: See the new instructions.   cefdinir 300 MG capsule Commonly known as: OMNICEF Take 1 capsule (300 mg total) by mouth 2 (two) times daily for 4 days.   furosemide 20 MG tablet Commonly known as: LASIX Take 1 tablet by mouth once daily   gabapentin 600 MG tablet Commonly known as: NEURONTIN Take 600 mg by mouth every 8 (eight) hours.   losartan 50 MG tablet Commonly known as: COZAAR Take 1 tablet (50 mg total) by mouth daily.   multivitamin with minerals Tabs tablet Take 1 tablet by mouth daily.   oxyCODONE-acetaminophen 10-325 MG tablet Commonly known as: PERCOCET Take 1 tablet by mouth 2 (two) times daily as needed (back pain.).   predniSONE 20 MG tablet Commonly known as: DELTASONE Take 3 tablets by mouth daily x1 day; then 2 tablet by mouth daily x2 days; then 1 tablet by mouth daily x3 days; then half tablet by mouth daily x3 days and stop prednisone. What changed:   medication strength  See the new instructions.   sodium chloride 0.65 % nasal spray Commonly known as: OCEAN Place 1 spray into the nose 4 (four) times daily as needed for congestion.   vitamin B-12 1000 MCG tablet Commonly known as: CYANOCOBALAMIN Take 1,000 mcg by mouth daily.            Discharge Care Instructions  (From admission, onward)         Start     Ordered   07/26/20 0000  Discharge wound care:       Comments: Apply xeroform  gauze to bilateral leg weeping areas on daily basis and cover them with ABD pads and kerlex. Apply a piece of aquacel to right heel daily and then cover with foam dressing. (change every 3 days or PRN soiling). uncomfortable abdomen ANTIBIOTICS   07/26/20 1423         Allergies  Allergen Reactions  . Other Swelling and Other (See Comments)    Farmed Fish (tightness in throat & lip swelling)  . Codeine Rash  . Sulfa Antibiotics Hives    Follow-up Information    [facility].   Specialty: Home Health Services Why: HHPT  Contact information: [place] [address] [phone]        [name], MD. Schedule an appointment as soon as possible for a visit in 10 day(s).   Specialty: Family Medicine Contact information: [address] [place] [address] [phone]        Wellington Hampshire, MD .   Specialty: Cardiology Contact information: [address] [place] [address] [phone]               The results of significant diagnostics from this  hospitalization (including imaging, microbiology, ancillary and laboratory) are listed below for reference.    Significant Diagnostic Studies: CT Chest W Contrast  Result Date: 07/22/2020 CLINICAL DATA:  Right upper lobe pulmonary nodule, pneumonia, productive cough and worsening shortness of breath for several days EXAM: CT CHEST WITH CONTRAST TECHNIQUE: Multidetector CT imaging of the chest was performed during intravenous contrast administration. CONTRAST:  70m OMNIPAQUE IOHEXOL 300 MG/ML  SOLN COMPARISON:  07/22/2020, 06/18/2020 FINDINGS: Cardiovascular: The heart and great vessels are unremarkable without pericardial effusion. No evidence of thoracic aortic aneurysm or dissection. Moderate atherosclerosis of the aorta. Extensive atherosclerosis of the coronary vasculature, greatest in the LAD distribution. Mediastinum/Nodes: There is pathologic  mediastinal and right hilar adenopathy. Enlarged precarinal lymph node measuring 14 mm image 62/2 and right hilar lymph node measuring 14 mm image 69/2. The thyroid and trachea are unremarkable. There is mild diffuse esophageal wall thickening, nonspecific. Lungs/Pleura: As seen on recent chest x-ray, there is a spiculated right upper lobe pulmonary nodule, measuring 17 x 15 by 16 mm reference image 50/5 and 109/6, highly concerning for malignancy. There is a smaller satellite nodule more inferiorly within the right upper lobe, measuring approximately 7 x 7 x 7 mm reference image 68/5 and 101/6, also suspicious for malignancy. Multifocal airspace disease is seen within the right middle and right lower lobes, with opacification of the distal bronchi. Overall favor pneumonia over aspiration. Upper lobe predominant emphysema again noted. No evidence of effusion or pneumothorax. Upper Abdomen: No acute abnormality. Musculoskeletal: No acute or destructive bony lesions. Reconstructed images demonstrate no additional findings. IMPRESSION: 1. 16 mm mean diameter right upper lobe spiculated nodule, with smaller 7 mm spiculated satellite nodule in the right upper lobe, both concerning for malignancy. PET-CT recommended for further evaluation. 2. Multifocal airspace disease within the right middle and right lower lobes, consistent with pneumonia. 3. Mediastinal and right hilar lymphadenopathy. Metastatic disease cannot be excluded. 4. Nonspecific diffuse esophageal wall thickening, which could reflect esophagitis. 5. Aortic Atherosclerosis (ICD10-I70.0) and Emphysema (ICD10-J43.9). Electronically Signed   By: MRanda NgoM.D.   On: 07/22/2020 15:37   DG Chest Portable 1 View  Result Date: 07/22/2020 CLINICAL DATA:  Shortness of breath for 2 days Hypertension COPD Former smoker EXAM: PORTABLE CHEST 1 VIEW COMPARISON:  06/18/2020 FINDINGS: Cardiomediastinal silhouette and pulmonary vasculature are within normal limits.  New airspace opacity at the right lung base suspicious for pneumonia. Nodular opacity again seen in the right upper lung measuring 1.2 cm. IMPRESSION: 1. New airspace opacity at the right lung base suspicious for pneumonia. 2. 1.2 cm nodule again seen in the right upper lung. Further evaluation with contrast enhanced CT again recommended. These results will be called to the ordering clinician or representative by the Radiologist Assistant, and communication documented in the PACS or [employer] Electronically Signed   By: FMiachel RouxM.D.   On: 07/22/2020 08:16   ECHOCARDIOGRAM COMPLETE  Result Date: 07/22/2020    ECHOCARDIOGRAM REPORT   Patient Name:   PSofie RowerDate of Exam: 07/22/2020 Medical Rec #:  [id number]    Height:       70.0 in Accession #:    [id number]   Weight:       244.0 lb Date of Birth:  309-09-1945    BSA:          2.271 m Patient Age:    89years      BP:  105/55 mmHg Patient Gender: M             HR:           98 bpm. Exam Location:  [facility] Procedure: 2D Echo Indications:    Elevated Troponin  History:        Patient has prior history of Echocardiogram examinations, most                 recent 12/27/2018. COPD; Risk Factors:Former Smoker. Sepsis,                 Chronic Respiratory Failure, LBBB.  Sonographer:    Leavy Cella RDCS (AE) Referring Phys: [phone] Royanne Foots [place]  1. Left ventricular ejection fraction, by estimation, is 60 to 65%. The left ventricle has normal function. The left ventricle has no regional wall motion abnormalities. There is mild left ventricular hypertrophy. Left ventricular diastolic parameters are indeterminate.  2. Right ventricular systolic function is normal. The right ventricular size is normal.  3. The mitral valve is normal in structure. No evidence of mitral valve regurgitation. No evidence of mitral stenosis.  4. The aortic valve was not well visualized. There is moderate calcification of the aortic valve. There is  moderate thickening of the aortic valve. Aortic valve regurgitation is not visualized. Mild aortic valve stenosis.  5. The inferior vena cava is normal in size with greater than 50% respiratory variability, suggesting right atrial pressure of 3 mmHg. FINDINGS  Left Ventricle: Left ventricular ejection fraction, by estimation, is 60 to 65%. The left ventricle has normal function. The left ventricle has no regional wall motion abnormalities. The left ventricular internal cavity size was normal in size. There is  mild left ventricular hypertrophy. Left ventricular diastolic parameters are indeterminate. Right Ventricle: The right ventricular size is normal. No increase in right ventricular wall thickness. Right ventricular systolic function is normal. Left Atrium: Left atrial size was normal in size. Right Atrium: Right atrial size was normal in size. Pericardium: There is no evidence of pericardial effusion. Mitral Valve: The mitral valve is normal in structure. No evidence of mitral valve regurgitation. No evidence of mitral valve stenosis. Tricuspid Valve: The tricuspid valve is normal in structure. Tricuspid valve regurgitation is not demonstrated. No evidence of tricuspid stenosis. Aortic Valve: The aortic valve was not well visualized. There is moderate calcification of the aortic valve. There is moderate thickening of the aortic valve. There is moderate aortic valve annular calcification. Aortic valve regurgitation is not visualized. Mild aortic stenosis is present. Aortic valve mean gradient measures 12.5 mmHg. Aortic valve peak gradient measures 22.7 mmHg. Aortic valve area, by VTI measures 1.60 cm. Pulmonic Valve: The pulmonic valve was not well visualized. Pulmonic valve regurgitation is not visualized. No evidence of pulmonic stenosis. Aorta: The aortic root is normal in size and structure. Pulmonary Artery: Indeterminant PASP, inadequate TR jet. Venous: The inferior vena cava is normal in size with  greater than 50% respiratory variability, suggesting right atrial pressure of 3 mmHg. IAS/Shunts: The interatrial septum was not well visualized.  LEFT VENTRICLE PLAX 2D LVIDd:         5.19 cm  Diastology LVIDs:         3.22 cm  LV e' medial:    9.79 cm/s LV PW:         1.02 cm  LV E/e' medial:  16.2 LV IVS:        1.10 cm  LV e' lateral:   17.80 cm/s  LVOT diam:     2.20 cm  LV E/e' lateral: 8.9 LV SV:         75 LV SV Index:   33 LVOT Area:     3.80 cm  RIGHT VENTRICLE RV S prime:     16.80 cm/s TAPSE (M-mode): 3.0 cm LEFT ATRIUM             Index       RIGHT ATRIUM           Index LA diam:        3.60 cm 1.58 cm/m  RA Area:     13.90 cm LA Vol (A2C):   76.4 ml 33.64 ml/m RA Volume:   34.90 ml  15.36 ml/m LA Vol (A4C):   25.8 ml 11.36 ml/m LA Biplane Vol: 44.0 ml 19.37 ml/m  AORTIC VALVE AV Area (Vmax):    1.85 cm AV Area (Vmean):   1.85 cm AV Area (VTI):     1.60 cm AV Vmax:           238.47 cm/s AV Vmean:          162.164 cm/s AV VTI:            0.467 m AV Peak Grad:      22.7 mmHg AV Mean Grad:      12.5 mmHg LVOT Vmax:         115.78 cm/s LVOT Vmean:        78.954 cm/s LVOT VTI:          0.196 m LVOT/AV VTI ratio: 0.42  AORTA Ao Root diam: 2.70 cm MITRAL VALVE MV Area (PHT): 4.65 cm     SHUNTS MV Decel Time: 163 msec     Systemic VTI:  0.20 m MV E velocity: 159.00 cm/s  Systemic Diam: 2.20 cm Carlyle Dolly MD Electronically signed by Carlyle Dolly MD Signature Date/Time: 07/22/2020/4:18:33 PM    Final     Microbiology: Recent Results (from the past 240 hour(s))  Resp Panel by RT-PCR (Flu A&B, Covid) Nasopharyngeal Swab     Status: None   Collection Time: 07/22/20  9:11 AM   Specimen: Nasopharyngeal Swab; Nasopharyngeal(NP) swabs in vial transport medium  Result Value Ref Range Status   SARS Coronavirus 2 by RT PCR NEGATIVE NEGATIVE Final    Comment: (NOTE) SARS-CoV-2 target nucleic acids are NOT DETECTED.  The SARS-CoV-2 RNA is generally detectable in upper respiratory specimens  during the acute phase of infection. The lowest concentration of SARS-CoV-2 viral copies this assay can detect is 138 copies/mL. A negative result does not preclude SARS-Cov-2 infection and should not be used as the sole basis for treatment or other patient management decisions. A negative result may occur with  improper specimen collection/handling, submission of specimen other than nasopharyngeal swab, presence of viral mutation(s) within the areas targeted by this assay, and inadequate number of viral copies(<138 copies/mL). A negative result must be combined with clinical observations, patient history, and epidemiological information. The expected result is Negative.  Fact Sheet for Patients:  EntrepreneurPulse.com.au  Fact Sheet for Healthcare Providers:  IncredibleEmployment.be  This test is no t yet approved or cleared by the [place] FDA and  has been authorized for detection and/or diagnosis of SARS-CoV-2 by FDA under an Emergency Use Authorization (EUA). This EUA will remain  in effect (meaning this test can be used) for the duration of the COVID-19 declaration under Section 564(b)(1) of the Act, 21 U.S.C.section 360bbb-3(b)(1), unless the authorization  is terminated  or revoked sooner.       Influenza A by PCR NEGATIVE NEGATIVE Final   Influenza B by PCR NEGATIVE NEGATIVE Final    Comment: (NOTE) The Xpert Xpress SARS-CoV-2/FLU/RSV plus assay is intended as an aid in the diagnosis of influenza from Nasopharyngeal swab specimens and should not be used as a sole basis for treatment. Nasal washings and aspirates are unacceptable for Xpert Xpress SARS-CoV-2/FLU/RSV testing.  Fact Sheet for Patients: EntrepreneurPulse.com.au  Fact Sheet for Healthcare Providers: IncredibleEmployment.be  This test is not yet approved or cleared by the [place] FDA and has been authorized for detection  and/or diagnosis of SARS-CoV-2 by FDA under an Emergency Use Authorization (EUA). This EUA will remain in effect (meaning this test can be used) for the duration of the COVID-19 declaration under Section 564(b)(1) of the Act, 21 U.S.C. section 360bbb-3(b)(1), unless the authorization is terminated or revoked.  Performed at [facility], [address], [place], [place] [address]   Culture, blood (routine x 2)     Status: None (Preliminary result)   Collection Time: 07/22/20 12:44 PM   Specimen: BLOOD  Result Value Ref Range Status   Specimen Description BLOOD BLOOD RIGHT HAND  Final   Special Requests   Final    Blood Culture adequate volume BOTTLES DRAWN AEROBIC AND ANAEROBIC   Culture   Final    NO GROWTH 4 DAYS Performed at [facility], [address] [place], [place] [address]    Report Status PENDING  Incomplete  Culture, blood (routine x 2)     Status: None (Preliminary result)   Collection Time: 07/22/20 12:53 PM   Specimen: BLOOD  Result Value Ref Range Status   Specimen Description BLOOD BLOOD LEFT HAND  Final   Special Requests   Final    Blood Culture adequate volume BOTTLES DRAWN AEROBIC AND ANAEROBIC   Culture   Final    NO GROWTH 4 DAYS Performed at [facility], [address], [place], [place] [address]    Report Status PENDING  Incomplete  MRSA PCR Screening     Status: None   Collection Time: 07/22/20  4:09 PM   Specimen: Nasal Mucosa; Nasopharyngeal  Result Value Ref Range Status   MRSA by PCR NEGATIVE NEGATIVE Final    Comment:        The GeneXpert MRSA Assay (FDA approved for NASAL specimens only), is one component of a comprehensive MRSA colonization surveillance program. It is not intended to diagnose MRSA infection nor to guide or monitor treatment for MRSA infections. Performed at [facility], [address], [place], [place] [address]      Labs: Basic Metabolic Panel: Recent Labs  Lab 07/22/20 0751 07/23/20 0451 07/24/20 0411  07/25/20 0501  NA 138 139 139 141  K 4.0 4.1 4.0 3.6  CL 99 102 103 103  CO2 _0 GLUCOSE 109* 131* 102* 84  BUN 17 21 26* 23  CREATININE 1.00 0.74 0.83 0.74  CALCIUM 8.7* 8.4* 8.5* 8.8*  MG  --  2.5*  --   --    Liver Function Tests: Recent Labs  Lab 07/22/20 0751 07/24/20 0411 07/25/20 0501  AST 30 29 43*  ALT 28 33 59*  ALKPHOS 51 42 47  BILITOT 0.6 0.5 0.5  PROT 7.1 6.3* 6.4*  ALBUMIN 3.5 2.9* 3.2*   CBC: Recent Labs  Lab 07/22/20 0751 07/23/20 0451 07/24/20 0411 07/25/20 0501  WBC 17.7* 14.7* 12.4* 12.3*  NEUTROABS 13.9*  --  9.8* 8.9*  HGB 12.3* 10.6* 10.9* 11.6*  HCT 38.8* 33.7* 35.0* 37.2*  MCV 101.6* 100.0 100.0 99.5  PLT 358 340 343 366   BNP (last 3 results) Recent Labs    07/24/20 0411 07/25/20 0501 07/26/20 0622  BNP 105.0* 123.0* 71.0   Signed:  Barton Dubois MD.  [facility] 07/26/2020, 3:20 PM

## 2020-07-26 NOTE — TOC Transition Note (Deleted)
Transition of Care [facility]) - CM/SW Discharge Note   Patient Details  Name: Joel Perez MRN: [id number] Date of Birth: 04/20/1946  Transition of Care [facility]) CM/SW Contact:  Natasha Bence, LCSW Phone Number: 07/26/2020, 10:37 AM   Clinical Narrative:    CSW received notification of patient's readiness for discharge. CSW observed HH order. CSW spoke with patient and inquired about agreeableness to [facility]. Patient agreeable to [facility] and express that he did not have a preference for the agency. CSW referred patient to [place] with Nanine Means. Lattie Haw agreeable to provide services. TOC signing off.   Final next level of care: [place] Barriers to Discharge: Barriers Resolved   Patient Goals and CMS Choice Patient states their goals for this hospitalization and ongoing recovery are:: Return home with [facility] CMS Medicare.gov Compare Post Acute Care list provided to:: Patient Choice offered to / list presented to : Patient  Discharge Placement                    Patient and family notified of of transfer: 07/26/20  Discharge Plan and Services                DME Arranged: N/A DME Agency: NA       HH Arranged: PT [place] Agency: [place] Date [place]: 07/26/20 Time [place]: [id number] Representative spoke with at [place]: [place] ([place]) Interventions     Readmission Risk Interventions No flowsheet data found.

## 2020-07-26 NOTE — TOC Transition Note (Signed)
Transition of Care [facility]) - CM/SW Discharge Note   Patient Details  Name: Joel Perez MRN: [id number] Date of Birth: May 03, 1946  Transition of Care [facility]) CM/SW Contact:  Natasha Bence, LCSW Phone Number: 07/26/2020, 11:01 AM   Clinical Narrative:    Clinical Narrative:    CSW received notification of patient's readiness for discharge. CSW observed HH order. CSW spoke with patient and inquired about agreeableness to [facility]. Patient agreeable to [facility] and express that he did not have a preference for the agency. CSW referred patient to [place] with [facility]. Lattie Haw agreeable to provide services. TOC signing off.   Final next level of care: [place] Barriers to Discharge: Barriers Resolved   Patient Goals and CMS Choice Patient states their goals for this hospitalization and ongoing recovery are:: Return home with [facility] CMS Medicare.gov Compare Post Acute Care list provided to:: Patient Choice offered to / list presented to : Patient  Discharge Placement                    Patient and family notified of of transfer: 07/26/20  Discharge Plan and Services                DME Arranged: N/A DME Agency: NA       HH Arranged: PT [place] Agency: [facility] Date [place]: 07/26/20 Time [place]: [id number] Representative spoke with at [place]: [place] ([place]) Interventions     Readmission Risk Interventions No flowsheet data found.

## 2020-07-27 LAB — CULTURE, BLOOD (ROUTINE X 2)
Culture: NO GROWTH
Culture: NO GROWTH
Special Requests: ADEQUATE
Special Requests: ADEQUATE

## 2020-07-28 ENCOUNTER — Other Ambulatory Visit: Payer: Self-pay | Admitting: Cardiovascular Disease

## 2020-07-28 DIAGNOSIS — R600 Localized edema: Secondary | ICD-10-CM

## 2020-07-28 NOTE — Telephone Encounter (Signed)
Refill request

## 2020-07-31 DIAGNOSIS — Z79899 Other long term (current) drug therapy: Secondary | ICD-10-CM | POA: Diagnosis not present

## 2020-07-31 DIAGNOSIS — G894 Chronic pain syndrome: Secondary | ICD-10-CM | POA: Diagnosis not present

## 2020-07-31 DIAGNOSIS — Z79891 Long term (current) use of opiate analgesic: Secondary | ICD-10-CM | POA: Diagnosis not present

## 2020-08-01 DIAGNOSIS — I5032 Chronic diastolic (congestive) heart failure: Secondary | ICD-10-CM | POA: Diagnosis not present

## 2020-08-01 DIAGNOSIS — I110 Hypertensive heart disease with heart failure: Secondary | ICD-10-CM | POA: Diagnosis not present

## 2020-08-01 DIAGNOSIS — I8310 Varicose veins of unspecified lower extremity with inflammation: Secondary | ICD-10-CM | POA: Diagnosis not present

## 2020-08-01 DIAGNOSIS — M6281 Muscle weakness (generalized): Secondary | ICD-10-CM | POA: Diagnosis not present

## 2020-08-01 DIAGNOSIS — A419 Sepsis, unspecified organism: Secondary | ICD-10-CM | POA: Diagnosis not present

## 2020-08-01 DIAGNOSIS — J441 Chronic obstructive pulmonary disease with (acute) exacerbation: Secondary | ICD-10-CM | POA: Diagnosis not present

## 2020-08-06 DIAGNOSIS — I5032 Chronic diastolic (congestive) heart failure: Secondary | ICD-10-CM | POA: Diagnosis not present

## 2020-08-06 DIAGNOSIS — J441 Chronic obstructive pulmonary disease with (acute) exacerbation: Secondary | ICD-10-CM | POA: Diagnosis not present

## 2020-08-06 DIAGNOSIS — I8310 Varicose veins of unspecified lower extremity with inflammation: Secondary | ICD-10-CM | POA: Diagnosis not present

## 2020-08-06 DIAGNOSIS — M6281 Muscle weakness (generalized): Secondary | ICD-10-CM | POA: Diagnosis not present

## 2020-08-06 DIAGNOSIS — I110 Hypertensive heart disease with heart failure: Secondary | ICD-10-CM | POA: Diagnosis not present

## 2020-08-06 DIAGNOSIS — A419 Sepsis, unspecified organism: Secondary | ICD-10-CM | POA: Diagnosis not present

## 2020-08-07 DIAGNOSIS — I110 Hypertensive heart disease with heart failure: Secondary | ICD-10-CM | POA: Diagnosis not present

## 2020-08-07 DIAGNOSIS — J441 Chronic obstructive pulmonary disease with (acute) exacerbation: Secondary | ICD-10-CM | POA: Diagnosis not present

## 2020-08-07 DIAGNOSIS — A419 Sepsis, unspecified organism: Secondary | ICD-10-CM | POA: Diagnosis not present

## 2020-08-07 DIAGNOSIS — I8310 Varicose veins of unspecified lower extremity with inflammation: Secondary | ICD-10-CM | POA: Diagnosis not present

## 2020-08-07 DIAGNOSIS — M6281 Muscle weakness (generalized): Secondary | ICD-10-CM | POA: Diagnosis not present

## 2020-08-07 DIAGNOSIS — I5032 Chronic diastolic (congestive) heart failure: Secondary | ICD-10-CM | POA: Diagnosis not present

## 2020-08-11 DIAGNOSIS — J449 Chronic obstructive pulmonary disease, unspecified: Secondary | ICD-10-CM | POA: Diagnosis not present

## 2020-08-11 DIAGNOSIS — R5381 Other malaise: Secondary | ICD-10-CM | POA: Diagnosis not present

## 2020-08-11 DIAGNOSIS — L97929 Non-pressure chronic ulcer of unspecified part of left lower leg with unspecified severity: Secondary | ICD-10-CM | POA: Diagnosis not present

## 2020-08-11 DIAGNOSIS — I5032 Chronic diastolic (congestive) heart failure: Secondary | ICD-10-CM | POA: Diagnosis not present

## 2020-08-11 DIAGNOSIS — J9611 Chronic respiratory failure with hypoxia: Secondary | ICD-10-CM | POA: Diagnosis not present

## 2020-08-12 DIAGNOSIS — I110 Hypertensive heart disease with heart failure: Secondary | ICD-10-CM | POA: Diagnosis not present

## 2020-08-12 DIAGNOSIS — J441 Chronic obstructive pulmonary disease with (acute) exacerbation: Secondary | ICD-10-CM | POA: Diagnosis not present

## 2020-08-12 DIAGNOSIS — A419 Sepsis, unspecified organism: Secondary | ICD-10-CM | POA: Diagnosis not present

## 2020-08-12 DIAGNOSIS — M6281 Muscle weakness (generalized): Secondary | ICD-10-CM | POA: Diagnosis not present

## 2020-08-12 DIAGNOSIS — I8310 Varicose veins of unspecified lower extremity with inflammation: Secondary | ICD-10-CM | POA: Diagnosis not present

## 2020-08-12 DIAGNOSIS — I5032 Chronic diastolic (congestive) heart failure: Secondary | ICD-10-CM | POA: Diagnosis not present

## 2020-08-13 ENCOUNTER — Encounter: Payer: Self-pay | Admitting: Internal Medicine

## 2020-08-13 ENCOUNTER — Ambulatory Visit: Payer: Medicare Other | Admitting: Internal Medicine

## 2020-08-13 ENCOUNTER — Other Ambulatory Visit: Payer: Self-pay

## 2020-08-13 DIAGNOSIS — R918 Other nonspecific abnormal finding of lung field: Secondary | ICD-10-CM | POA: Diagnosis not present

## 2020-08-13 DIAGNOSIS — J9611 Chronic respiratory failure with hypoxia: Secondary | ICD-10-CM

## 2020-08-13 DIAGNOSIS — J449 Chronic obstructive pulmonary disease, unspecified: Secondary | ICD-10-CM | POA: Diagnosis not present

## 2020-08-13 MED ORDER — PREDNISONE 10 MG PO TABS: ORAL_TABLET | ORAL | 2 refills | Status: DC

## 2020-08-13 MED ORDER — BREZTRI AEROSPHERE 160-9-4.8 MCG/ACT IN AERO: 2.0000 | INHALATION_SPRAY | Freq: Two times a day (BID) | RESPIRATORY_TRACT | 0 refills | Status: DC

## 2020-08-13 MED ORDER — BREZTRI AEROSPHERE 160-9-4.8 MCG/ACT IN AERO
INHALATION_SPRAY | RESPIRATORY_TRACT | 11 refills | Status: DC
Start: 2020-08-13 — End: 2021-07-19

## 2020-08-13 NOTE — Patient Instructions (Signed)
Plan A = Automatic = Always=    Breztri Take 2 puffs first thing in am and then another 2 puffs about 12 hours later.      Plan B = Backup (to supplement plan A, not to replace it) Only use your albuterol inhaler as a rescue medication to be used if you can't catch your breath by resting or doing a relaxed purse lip breathing pattern.  - The less you use it, the better it will work when you need it. - Ok to use the inhaler up to 2 puffs  every 4 hours if you must but call for appointment if use goes up over your usual need - Don't leave home without it !!  (think of it like the spare tire for your car)   Plan C = Crisis (instead of Plan B but only if Plan B stops working) - only use your albuterol nebulizer if you first try Plan B and it fails to help > ok to use the nebulizer up to every 4 hours but if start needing it regularly call for immediate appointment   Plan D = Deltasone = prednisone - increase dose if ABC not working well  2 until better then 1 daily   Plan E = ER - go to ER or call 911 if all else fails   Make sure you check your oxygen saturation  at your highest level of activity  to be sure it stays over 90% and adjust  02 flow upward to maintain this level if needed but remember to turn it back to previous settings when you stop (to conserve your supply).    Please schedule a follow up visit in 3 months but call sooner if needed

## 2020-08-13 NOTE — Progress Notes (Signed)
Subjective:   Patient ID: Joel Perez, male    DOB: [birth year]   MRN: [id number]    Brief patient profile:  14 yowm quit smoking 02/2018/MM  referred by Dr Moreen Fowler for eval of copd with GOLD II criteria established 02/09/13    History of Present Illness  12/27/2012 1st pulmonary eval/ Kymber Kosar cc onset x 2009 am cough cough/ congestion and progresive doe with white mucus x sev tbsp x sev min better p proaire and maintained on spiriva which he feels doesn't work as well as advair. Doe x one flight steps, dragging the garbage to street stops x 2. He denies cough to me but actually has a rattling with cough maneuver but doesn't bring up much mucus, what he does bring up is mostly in ams and mucoid and some better since quit smoking  rec symbicort 160 Take 2 puffs first thing in am and then another 2 puffs about 12 hours later.  Only use your albuterol (proaire) as a rescue medication to be used if you can't catch your breath by resting or doing a relaxed purse lip breathing pattern. The less you use it, the better it will work when you need it.  As you improve ok to stop spiriva  Please schedule a follow up office visit in 6 weeks, call sooner if needed with pft's    09/22/2015  f/u ov/Deasiah Hagberg re: GOLD III copd/ maint rx = symbicort/ spiriva resp We will be referring you to rehab at [facility]        10/03/2017  f/u ov/Leisel Pinette re:  GOLD III still smoking  / having trouble affording meds Chief Complaint  Patient presents with  . Follow-up    Increased SOB over the past 2 wks. He is using his proair 4-5 x per day and neb once daily on average.  He states he gets SOB walking just short distances. He did try round of pred and this helped some. He has been coughing up some clear sputum.    Dyspnea:  Still doe = MMRC3 = can't walk 100 yards even at a slow pace at a flat grade s stopping due to sob   Cough: clear / thick/ rattling worst in am  Sleep: R side / 2 pillows /no 02  SABA use:  Overusing unless on  prednisone rec Plan A = Automatic =  BEVESP  Take 2 puffs first thing in am and then another 2 puffs about 12 hours later.  Work on maintaining perfect  inhaler technique:   Plan B = Backup Only use your albuterol as a rescue medication Plan C = Crisis - only use your albuterol nebulizer if you first try Plan B and it fails to help > ok to use the nebulizer up to every 4 hours but if start needing it regularly call for immediate appointment Plan D = Deltasone, take x 2 daily until better then 1 daily x 5 days until better and stop  The key is to stop smoking completely before smoking completely stops you!  Please schedule a follow up visit in 3 months but call sooner if needed  with all medications /inhalers/ solutions in hand so we can verify exactly what you are taking. This includes all medications from all doctors and over the counters     07/05/2018  f/u ov/Sheniah Supak re:  GOLD III / maint on symb 160 2bid  Chief Complaint  Patient presents with  . Follow-up    PFT done today. Breathing has  improved some. He uses his albuterol inhaler about once per day. He has not needed neb.   Dyspnea:  Treadmill at 1.5 mph at zero incline x 10 min stopped by back   / some [employer] ok MMRC3 = can't walk 100 yards even at a slow pace at a flat grade s stopping due to sob   Cough: none Sleeping: bed is flat/ 2 big pillows  SABA use: once a day  If has to go out of house for any reason  02: has but not using  rec Plan A = Automatic = symbicort 160 Take 2 puffs first thing in am and then another 2 puffs about 12 hours later.  And add spiriva 2 pffs each am  Work on inhaler technique:  Plan B = Backup Only use your albuterol inhaler as a rescue medication Plan C = Crisis - only use your albuterol nebulizer if you first try Plan B   Place your 0xygen on  when you walk to maintain the saturation above 90% or walk slower      11/14/2018  f/u ov/Kaylin Schellenberg re:  Copd III no better on 20 mg prednisone daily  finished it and this time says no worse off and doesn't think spiriva helping either  Chief Complaint  Patient presents with  . Follow-up    Breathing is about the same. He is using his proair 2-3 x per day and neb 2 x per wk.  Dyspnea:  MMRC3 = can't walk 100 yards even at a slow pace at a flat grade s stopping due to sob   Cough: minimal  Sleeping: on bed / flat/ 2 pillows  SABA use: as above 02: has poc doesn't think it works  rec Try off the spiriva (think of it like high octane fuel) Goal for walking is to keep your 02 sats above 90% when walking    02/13/2019  f/u ov/Becca Bayne re: COPD GOLD III / 02 dep with activity  / doe not worse off spiriva/ has pred for prn use  Chief Complaint  Patient presents with  . Follow-up  Dyspnea:  Doing gxt x 10 min 1.0 mph flat stops due to hip not sob/ sats ? In 80s but not using 02 correctly   Cough: better  Sleeping: on R side/ 2 pillows  SABA use:  02: not using at hs / using 3lpm cooking / not consistently using while walking  rec Continue symbicort 160  Take 2 puffs first thing in am and then another 2 puffs about 12 hours later.  Be sure to wear 02 2.5-3lpm when walking for exercise to keep the saturations consistently above 90% - this will help your heart and circulation and help you burn fat Please schedule a follow up visit in 3 months but call sooner if needed  Add ? Add breztri next ov ? If insurance will cover   05/21/2019  f/u ov/Natanya Holecek re:  Copd  III  No longer on spiriva / pred is plan D Chief Complaint  Patient presents with  . Follow-up    SOB on extertion. Patient denies any coughing.  Dyspnea:  Doing gxt x 15 min s stopping @ grade = flat at 1.1 mph on 3lpm not checking sats  Cough: no cough  Sleeping: on side / 2 pillows  SABA use: neb before activity 02: 3lpm with activity not checking levels / no using at hs / not using it at rest rec Plan A = Automatic = Always=  Breztri  Take 2 puffs first thing in am and then  another 2 puffs about 12 hours later.  Plan B = Backup (to supplement plan A, not to replace it) Only use your albuterol inhaler as a rescue medication  Plan C = Crisis (instead of Plan B but only if Plan B stops working) - only use your albuterol nebulizer if you first try Plan B Plan D = Prednisone 10 mg  Take 2 daily until better then 1 daily x 5 days and stop Make sure you check your oxygen saturations at highest level of activity to be sure it stays over 90% and adjust upward to maintain this level if needed but remember to turn it back to previous settings when you stop (to conserve your supply)     12/07/2019  f/u ov/Alphons Burgert re:  COPD  III/breztri  Plus saba tid never prechallenge/ prednisone 3 daily "on way down" (this was not rec)  Chief Complaint  Patient presents with  . Follow-up  Dyspnea: limited by leg from doing treadmill but walking [place] and stops half way x 20 min loop @ 3lpm sat > 90% lift hip also limiting Cough: better  Sleeping: bed flat/ 2 pillows  SABA use: as above   02: 3lpm  with acitivity only  rec No change in medications  Please schedule a follow up visit in 6  months but call sooner if needed  - [place] office     06/18/2020  f/u ov/[place] office/Sharnelle Cappelli re: GOLD III / 02 24/7  breztri 2bid And pred 10 x 2 daily says neveroff prednisone  X  6 months and gaining wt - still some vaping  Chief Complaint  Patient presents with  . Follow-up    Pt states breathing has been going downhill x 3 wks. He is getting winded walking room to room. He also c/o weakness. Weight is up since the last visit and has some swelling in his legs. He is using his albuterol 5 x per day on average and neb a couple times per wk.   Dyspnea: room to room  > sats 96% on 3lpm  Cough: none  Sleeping: sleeping in recliner x 45 degrees  SABA use: hfa helps so does neb but not rechallenging  02: 3 - 4lpm 24/7  More swelling than usual assoc with sob  rec Prednisone 10 mg x  2  daily until better then one daily  Lasix 20 mg x 2 daily untl swelling better then daily  Please remember to go to the  x-ray department at [facility]   > ? Lung nodule When see [place] you need CMET TSH  BNP and CBC with diff   CT 07/22/20 1. 16 mm mean diameter right upper lobe spiculated nodule, with smaller 7 mm spiculated satellite nodule in the right upper lobe, both concerning for malignancy. PET-CT recommended for further evaluation. 2. Multifocal airspace disease within the right middle and right lower lobes, consistent with pneumonia. 3. Mediastinal and right hilar lymphadenopathy. Metastatic disease cannot be excluded. 4. Nonspecific diffuse esophageal wall thickening, which could reflect esophagitis. 5. Aortic Atherosclerosis (ICD10-I70.0) and Emphysema (ICD10-J43.9).   08/13/2020  f/u ov/[place] office/Gail Creekmore re:  GOLD II/ 02 dep for ex and hs  Chief Complaint  Patient presents with  . Hospital Followup     Breathing improved some but not back to his normal baseline. He has some cough- non prod. He is using his proair 2 x daily on average and  neb with albuterol about 5 x per wk.     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   Sleeping as above without nocturnal   exacerbation  of respiratory  c/o's or need for noct saba. Also denies any obvious fluctuation of symptoms with weather or environmental changes or other aggravating or alleviating factors except as outlined above   No unusual exposure hx or h/o childhood pna/ asthma or knowledge of premature birth.  Current Allergies, Complete  Past Medical History, Past Surgical History, Family History, and Social History were reviewed in [employer] record.  ROS  The following are not active complaints unless bolded Hoarseness, sore throat, dysphagia, dental problems, itching, sneezing,  nasal congestion or discharge of excess mucus or purulent secretions, ear ache,   fever, chills, sweats, unintended wt loss or wt gain, classically pleuritic or exertional cp,  orthopnea pnd or arm/hand swelling  or leg swelling, presyncope, palpitations, abdominal pain, anorexia, nausea, vomiting, diarrhea  or change in bowel habits or change in bladder habits, change in stools or change in urine, dysuria, hematuria,  rash, arthralgias, visual complaints, headache, numbness, weakness or ataxia or problems with walking or coordination,  change in mood or  memory.        Current Meds  Medication Sig  . albuterol (PROVENTIL) (2.5 MG/3ML) 0.083% nebulizer solution USE 1 VIAL IN NEBULIZER EVERY 6 HOURS AS NEEDED FOR WHEEZING AND FOR SHORTNESS OF BREATH (Patient taking differently: Take 2.5 mg by nebulization every 6 (six) hours as needed for wheezing or shortness of breath.)  . aspirin EC 81 MG tablet Take 81 mg by mouth daily. Swallow whole.  Marland Kitchen atorvastatin (LIPITOR) 40 MG tablet TAKE 1 TABLET BY MOUTH ONCE DAILY . APPOINTMENT REQUIRED FOR FUTURE REFILLS (Patient taking differently: Take 40 mg by mouth daily.)  . BREZTRI AEROSPHERE 160-9-4.8 MCG/ACT AERO INHALE 2 PUFFS INTO LUNGS TWICE DAILY (Patient taking differently: Inhale 2 puffs into the lungs in the morning and at bedtime.)  . furosemide (LASIX) 20 MG tablet Take 1 tablet by mouth once daily  . gabapentin (NEURONTIN) 600 MG tablet Take 600 mg by mouth every 8 (eight) hours.  Marland Kitchen losartan (COZAAR) 50 MG tablet Take 1 tablet (50 mg total) by mouth daily.  . Multiple Vitamin (MULTIVITAMIN WITH MINERALS) TABS tablet Take 1 tablet by mouth daily.  Marland Kitchen oxyCODONE-acetaminophen (PERCOCET)  10-325 MG tablet Take 1 tablet by mouth 2 (two) times daily as needed (back pain.).   Marland Kitchen predniSONE (DELTASONE) 20 MG tablet Take 3 tablets by mouth daily x1 day; then 2 tablet by mouth daily x2 days; then 1 tablet by mouth daily x3 days; then half tablet by mouth daily x3 days and stop prednisone.  Marland Kitchen PROAIR HFA 108 (90 Base) MCG/ACT inhaler INHALE 2 PUFFS BY MOUTH EVERY 6 HOURS AS NEEDED FOR WHEEZING FOR SHORTNESS OF BREATH (Patient taking differently: Inhale 2 puffs into the lungs every 6 (six) hours as needed for wheezing or shortness of breath.)  . sodium chloride (OCEAN) 0.65 % nasal spray Place 1 spray into the nose  4 (four) times daily as needed for congestion.   . vitamin B-12 (CYANOCOBALAMIN) 1000 MCG tablet Take 1,000 mcg by mouth daily.                   Objective:   Physical Exam  08/13/2020    249   06/18/2020    243 12/07/2019     227  05/21/2019   234  02/13/2019     227  01/18/2014       179 > 02/07/2015 190 > 03/21/2015   192 > 06/23/2015  196 >   12/30/2015 198 > 03/31/2016 198 >  07/02/2016  201 > 12/30/2016 201 >  04/01/2017   195 >  07/04/2017  201 > 10/03/2017  197 >   01/03/2018  206 > 04/05/2018    206> 05/23/2018  211 > 07/05/2018  217 > 11/14/2018  225     02/09/13 177 lb (80.287 kg)  12/27/12 173 lb 12.8 oz (78.835 kg)       Vital signs reviewed  08/13/2020  - Note at rest 02 sats  94% on RA   General appearance:    Chronically ill obese amb wm nad    HEENT : pt wearing mask not removed for exam due to covid -19 concerns.    NECK :  without JVD/Nodes/TM/ nl carotid upstrokes bilaterally   LUNGS: no acc muscle use,  Mod barrel  contour chest wall with bilateral  Distant bs s audible wheeze and  without cough on insp or exp maneuvers and mod  Hyperresonant  to  percussion bilaterally     CV:  RRR  no s3 or murmur or increase in P2, and trace bilateral LE edema   ABD:  soft and nontender with pos mid insp Hoover's  in the supine position. No bruits or organomegaly  appreciated, bowel sounds nl  MS:     ext warm without deformities, calf tenderness, cyanosis or clubbing No obvious joint restrictions   SKIN: warm and dry with chronic venous stasis changes both LE's  NEURO:  alert, approp, nl sensorium with  no motor or cerebellar deficits apparent.                       Assessment & Plan:

## 2020-08-14 ENCOUNTER — Telehealth: Payer: Self-pay | Admitting: Cardiovascular Disease

## 2020-08-14 ENCOUNTER — Encounter: Payer: Self-pay | Admitting: Internal Medicine

## 2020-08-14 DIAGNOSIS — M6281 Muscle weakness (generalized): Secondary | ICD-10-CM | POA: Diagnosis not present

## 2020-08-14 DIAGNOSIS — I8310 Varicose veins of unspecified lower extremity with inflammation: Secondary | ICD-10-CM | POA: Diagnosis not present

## 2020-08-14 DIAGNOSIS — R918 Other nonspecific abnormal finding of lung field: Secondary | ICD-10-CM | POA: Insufficient documentation

## 2020-08-14 DIAGNOSIS — A419 Sepsis, unspecified organism: Secondary | ICD-10-CM | POA: Diagnosis not present

## 2020-08-14 DIAGNOSIS — I110 Hypertensive heart disease with heart failure: Secondary | ICD-10-CM | POA: Diagnosis not present

## 2020-08-14 DIAGNOSIS — I5032 Chronic diastolic (congestive) heart failure: Secondary | ICD-10-CM | POA: Diagnosis not present

## 2020-08-14 DIAGNOSIS — J441 Chronic obstructive pulmonary disease with (acute) exacerbation: Secondary | ICD-10-CM | POA: Diagnosis not present

## 2020-08-14 NOTE — Telephone Encounter (Signed)
Called pt informing him of Dr. Theodosia Blender and Dr. Tyrell Antonio response and patient requested a switch to Dr. Gwenlyn Found. Please advise.

## 2020-08-14 NOTE — Assessment & Plan Note (Addendum)
Quit smoking 02/2018  - MM phenotype  - 02/09/2013  FEV1 2.14 (68%) ratio 60 p 30% better from B2 and dloc 52 corrects to 69 - 02/07/2015  extensive coaching HFA effectiveness =    90% from a baseline of 75%  - 02/07/2015  Walked RA x 3 laps @ 185 ft each stopped due to  End of study, nl pace, no sob or desat   - PFT's  03/21/2015  FEV1 1.92 (62 % ) ratio 60  p 14 % improvement from saba with DLCO  47 % corrects to 62 % for alv volume   - trial of spiriva respimat 03/21/2015  With goal to be able to walk up inclines > no better 06/23/2015 > try bevespi 2 bid x 3 weeks  -sob   Worse p 3 weeks bevespi  so resumed symb/spirva 06/25/2015  - referred to rehab 09/22/2015 >>> d/c'd p 19 sessions on 01/22/16 due to costs - 03/31/2016 rec pred x 6 days if needed  - 12/30/2016    restart spiriva 2.5 x 2 each am > could not afford it  - 04/01/2017    try bevespi  - 07/04/2017    continue beveslop and pred as Plan D but misunderstood and took daily  - 01/03/2018 daliresp trial in effort to reduced pred dependency > could not afford  - quit smoking again 02/2018  - 05/23/2018  After extensive coaching inhaler device,  effectiveness =    90% from a baseline of 75% > try symb 160 again as seems much better while on prednisone and if worse then add back spiriva  - Alpha one AT screen 05/23/2018   MM level 125  - PFT's  07/05/2018  FEV1 1.29 (43 % ) ratio 0.49  p 36 % improvement from saba p symb 160 x 2  prior to study with DLCO  48 % corrects to 67  % for alv volume   - 07/05/2018    try spiriva 2.5 x 2 puffs each am > no better on it 11/14/2018 so rec trial off  - 11/14/2018   Continue symbicort 160 2bid only plus prednisone as plan D - 05/21/2019  After extensive coaching inhaler device,  effectiveness =    90% so try breztri  - 06/18/20 changed prednisone to ceiling of 20 mg and floor of 10 mg    Group D in terms of symptom/risk and laba/lama/ICS  therefore appropriate rx at this point >>>  Continue breztri 2bid and same  floor of 10 mg prednisone daily with plan D to double the baseline dose using the concept the lowest dose that controls the flairs is the best dose and hoping to drop the floor to 5 mg next ov    Re saba: I spent extra time with pt today reviewing appropriate use of albuterol for prn use on exertion with the following points: 1) saba is for relief of sob that does not improve by walking a slower pace or resting but rather if the pt does not improve after trying this first. 2) If the pt is convinced, as many are, that saba helps recover from activity faster then it's easy to tell if this is the case by re-challenging : ie stop, take the inhaler, then p 5 minutes try the exact same activity (intensity of workload) that just caused the symptoms and see if they are substantially diminished or not after saba 3) if there is an activity that reproducibly causes the symptoms, try the  saba 15 min before the activity on alternate days   If in fact the saba really does help, then fine to continue to use it prn but advised may need to look closer at the maintenance regimen being used to achieve better control of airways disease with exertion.

## 2020-08-14 NOTE — Assessment & Plan Note (Addendum)
See CT chest 07/22/20   Given severity of his lung dz no options for bx or rx at this point - will arrange f/u CT at next ov depending on his clinical condition    Discussed in detail all the  indications, usual  risks and alternatives  relative to the benefits with patient who agrees to proceed with conservative f/u as outlined           Each maintenance medication was reviewed in detail including emphasizing most importantly the difference between maintenance and prns and under what circumstances the prns are to be triggered using an action plan format where appropriate.  Total time for H and P, chart review, counseling, reviewing 02/hfa/neb device(s) and generating customized AVS unique to this office visit / same day charting = 25 min

## 2020-08-14 NOTE — Telephone Encounter (Signed)
I do not to PAD so he needs to stay with [place] or change to Dr. Gwenlyn Found

## 2020-08-14 NOTE — Telephone Encounter (Signed)
Ok to switch from my standpoint.  

## 2020-08-14 NOTE — Telephone Encounter (Signed)
Fine with me

## 2020-08-14 NOTE — Telephone Encounter (Signed)
Received a referral via [employer] stating patient is requesting to schedule an appointment with Dr. Radford Pax and would like to do a Provider Switch from Dr. Fletcher Anon. Please advise.

## 2020-08-14 NOTE — Assessment & Plan Note (Signed)
Started on 02 with amb 2019 05/23/2018   Walked RA x one lap = 210 ft - stopped due to  desats to 88% then walked second lap on 3lpm POC and sats 89% at end , slow pace  - 11/14/2018 Patient Saturations on Room Air at Rest = 96  %   while Ambulating = 88%   Then  2  Liters of oxygen while Ambulating = 94% - 02/13/2019   Walked RA x one lap =  approx 250 ft - stopped due to  desats to 88% placed on 2.5 lpm and then completed second lap s desats  sats back to 94% on RA at rest s/p pna / acute flare   rec  Make sure you check your oxygen saturation  at your highest level of activity  to be sure it stays over 90% and adjust  02 flow upward to maintain this level if needed but remember to turn it back to previous settings when you stop (to conserve your supply).

## 2020-08-19 DIAGNOSIS — M6281 Muscle weakness (generalized): Secondary | ICD-10-CM | POA: Diagnosis not present

## 2020-08-19 DIAGNOSIS — J441 Chronic obstructive pulmonary disease with (acute) exacerbation: Secondary | ICD-10-CM | POA: Diagnosis not present

## 2020-08-19 DIAGNOSIS — I110 Hypertensive heart disease with heart failure: Secondary | ICD-10-CM | POA: Diagnosis not present

## 2020-08-19 DIAGNOSIS — A419 Sepsis, unspecified organism: Secondary | ICD-10-CM | POA: Diagnosis not present

## 2020-08-19 DIAGNOSIS — I8310 Varicose veins of unspecified lower extremity with inflammation: Secondary | ICD-10-CM | POA: Diagnosis not present

## 2020-08-19 DIAGNOSIS — I5032 Chronic diastolic (congestive) heart failure: Secondary | ICD-10-CM | POA: Diagnosis not present

## 2020-08-21 DIAGNOSIS — I10 Essential (primary) hypertension: Secondary | ICD-10-CM | POA: Diagnosis not present

## 2020-08-21 DIAGNOSIS — I5032 Chronic diastolic (congestive) heart failure: Secondary | ICD-10-CM | POA: Diagnosis not present

## 2020-08-21 DIAGNOSIS — E782 Mixed hyperlipidemia: Secondary | ICD-10-CM | POA: Diagnosis not present

## 2020-08-21 DIAGNOSIS — J449 Chronic obstructive pulmonary disease, unspecified: Secondary | ICD-10-CM | POA: Diagnosis not present

## 2020-08-25 ENCOUNTER — Encounter (HOSPITAL_COMMUNITY): Payer: Self-pay

## 2020-08-25 ENCOUNTER — Inpatient Hospital Stay (HOSPITAL_COMMUNITY)
Admission: EM | Admit: 2020-08-25 | Discharge: 2020-09-03 | DRG: 853 | Disposition: A | Discharge status: Home Health | Payer: Medicare Other | Attending: Internal Medicine | Admitting: Internal Medicine

## 2020-08-25 ENCOUNTER — Emergency Department (HOSPITAL_COMMUNITY): Payer: Medicare Other

## 2020-08-25 ENCOUNTER — Other Ambulatory Visit: Payer: Self-pay

## 2020-08-25 DIAGNOSIS — E782 Mixed hyperlipidemia: Secondary | ICD-10-CM | POA: Diagnosis not present

## 2020-08-25 DIAGNOSIS — I739 Peripheral vascular disease, unspecified: Secondary | ICD-10-CM | POA: Diagnosis present

## 2020-08-25 DIAGNOSIS — I5032 Chronic diastolic (congestive) heart failure: Secondary | ICD-10-CM | POA: Diagnosis not present

## 2020-08-25 DIAGNOSIS — J441 Chronic obstructive pulmonary disease with (acute) exacerbation: Secondary | ICD-10-CM | POA: Diagnosis not present

## 2020-08-25 DIAGNOSIS — L89613 Pressure ulcer of right heel, stage 3: Secondary | ICD-10-CM | POA: Diagnosis present

## 2020-08-25 DIAGNOSIS — T8189XA Other complications of procedures, not elsewhere classified, initial encounter: Secondary | ICD-10-CM | POA: Diagnosis not present

## 2020-08-25 DIAGNOSIS — I96 Gangrene, not elsewhere classified: Secondary | ICD-10-CM | POA: Diagnosis not present

## 2020-08-25 DIAGNOSIS — Z89421 Acquired absence of other right toe(s): Secondary | ICD-10-CM | POA: Diagnosis not present

## 2020-08-25 DIAGNOSIS — R000 Tachycardia, unspecified: Secondary | ICD-10-CM | POA: Diagnosis not present

## 2020-08-25 DIAGNOSIS — Z20822 Contact with and (suspected) exposure to covid-19: Secondary | ICD-10-CM | POA: Diagnosis not present

## 2020-08-25 DIAGNOSIS — N400 Enlarged prostate without lower urinary tract symptoms: Secondary | ICD-10-CM | POA: Diagnosis present

## 2020-08-25 DIAGNOSIS — L97519 Non-pressure chronic ulcer of other part of right foot with unspecified severity: Secondary | ICD-10-CM | POA: Diagnosis present

## 2020-08-25 DIAGNOSIS — L89322 Pressure ulcer of left buttock, stage 2: Secondary | ICD-10-CM | POA: Diagnosis present

## 2020-08-25 DIAGNOSIS — E11621 Type 2 diabetes mellitus with foot ulcer: Secondary | ICD-10-CM | POA: Diagnosis not present

## 2020-08-25 DIAGNOSIS — L0390 Cellulitis, unspecified: Secondary | ICD-10-CM | POA: Diagnosis not present

## 2020-08-25 DIAGNOSIS — E1169 Type 2 diabetes mellitus with other specified complication: Secondary | ICD-10-CM | POA: Diagnosis not present

## 2020-08-25 DIAGNOSIS — J449 Chronic obstructive pulmonary disease, unspecified: Secondary | ICD-10-CM | POA: Diagnosis present

## 2020-08-25 DIAGNOSIS — B951 Streptococcus, group B, as the cause of diseases classified elsewhere: Secondary | ICD-10-CM | POA: Diagnosis not present

## 2020-08-25 DIAGNOSIS — J9611 Chronic respiratory failure with hypoxia: Secondary | ICD-10-CM | POA: Diagnosis not present

## 2020-08-25 DIAGNOSIS — Z87891 Personal history of nicotine dependence: Secondary | ICD-10-CM

## 2020-08-25 DIAGNOSIS — D500 Iron deficiency anemia secondary to blood loss (chronic): Secondary | ICD-10-CM | POA: Diagnosis present

## 2020-08-25 DIAGNOSIS — I70235 Atherosclerosis of native arteries of right leg with ulceration of other part of foot: Secondary | ICD-10-CM | POA: Diagnosis not present

## 2020-08-25 DIAGNOSIS — I110 Hypertensive heart disease with heart failure: Secondary | ICD-10-CM | POA: Diagnosis not present

## 2020-08-25 DIAGNOSIS — M6281 Muscle weakness (generalized): Secondary | ICD-10-CM | POA: Diagnosis not present

## 2020-08-25 DIAGNOSIS — A401 Sepsis due to streptococcus, group B: Secondary | ICD-10-CM | POA: Diagnosis not present

## 2020-08-25 DIAGNOSIS — D62 Acute posthemorrhagic anemia: Secondary | ICD-10-CM | POA: Diagnosis not present

## 2020-08-25 DIAGNOSIS — E1142 Type 2 diabetes mellitus with diabetic polyneuropathy: Secondary | ICD-10-CM | POA: Diagnosis present

## 2020-08-25 DIAGNOSIS — E872 Acidosis: Secondary | ICD-10-CM | POA: Diagnosis present

## 2020-08-25 DIAGNOSIS — Z79899 Other long term (current) drug therapy: Secondary | ICD-10-CM

## 2020-08-25 DIAGNOSIS — A4101 Sepsis due to Methicillin susceptible Staphylococcus aureus: Principal | ICD-10-CM | POA: Diagnosis present

## 2020-08-25 DIAGNOSIS — I10 Essential (primary) hypertension: Secondary | ICD-10-CM | POA: Diagnosis not present

## 2020-08-25 DIAGNOSIS — M7989 Other specified soft tissue disorders: Secondary | ICD-10-CM | POA: Diagnosis not present

## 2020-08-25 DIAGNOSIS — Z7982 Long term (current) use of aspirin: Secondary | ICD-10-CM

## 2020-08-25 DIAGNOSIS — I872 Venous insufficiency (chronic) (peripheral): Secondary | ICD-10-CM | POA: Diagnosis not present

## 2020-08-25 DIAGNOSIS — A419 Sepsis, unspecified organism: Secondary | ICD-10-CM | POA: Diagnosis not present

## 2020-08-25 DIAGNOSIS — Z9981 Dependence on supplemental oxygen: Secondary | ICD-10-CM | POA: Diagnosis not present

## 2020-08-25 DIAGNOSIS — Z7951 Long term (current) use of inhaled steroids: Secondary | ICD-10-CM | POA: Diagnosis not present

## 2020-08-25 DIAGNOSIS — R6520 Severe sepsis without septic shock: Secondary | ICD-10-CM | POA: Diagnosis not present

## 2020-08-25 DIAGNOSIS — I8310 Varicose veins of unspecified lower extremity with inflammation: Secondary | ICD-10-CM | POA: Diagnosis not present

## 2020-08-25 DIAGNOSIS — M869 Osteomyelitis, unspecified: Secondary | ICD-10-CM | POA: Diagnosis not present

## 2020-08-25 DIAGNOSIS — Z6835 Body mass index (BMI) 35.0-35.9, adult: Secondary | ICD-10-CM

## 2020-08-25 DIAGNOSIS — L97518 Non-pressure chronic ulcer of other part of right foot with other specified severity: Secondary | ICD-10-CM | POA: Diagnosis not present

## 2020-08-25 LAB — URINALYSIS, ROUTINE W REFLEX MICROSCOPIC
Bacteria, UA: NONE SEEN
Bilirubin Urine: NEGATIVE
Glucose, UA: NEGATIVE mg/dL
Ketones, ur: NEGATIVE mg/dL
Leukocytes,Ua: NEGATIVE
Nitrite: NEGATIVE
Protein, ur: NEGATIVE mg/dL
Specific Gravity, Urine: 1.008 (ref 1.005–1.030)
pH: 8 (ref 5.0–8.0)

## 2020-08-25 LAB — RESP PANEL BY RT-PCR (FLU A&B, COVID) ARPGX2
Influenza A by PCR: NEGATIVE
Influenza B by PCR: NEGATIVE
SARS Coronavirus 2 by RT PCR: NEGATIVE

## 2020-08-25 LAB — CBC WITH DIFFERENTIAL/PLATELET
Abs Immature Granulocytes: 0.25 10*3/uL — ABNORMAL HIGH (ref 0.00–0.07)
Basophils Absolute: 0 10*3/uL (ref 0.0–0.1)
Basophils Relative: 0 %
Eosinophils Absolute: 0 10*3/uL (ref 0.0–0.5)
Eosinophils Relative: 0 %
HCT: 36.6 % — ABNORMAL LOW (ref 39.0–52.0)
Hemoglobin: 11.4 g/dL — ABNORMAL LOW (ref 13.0–17.0)
Immature Granulocytes: 2 %
Lymphocytes Relative: 5 %
Lymphs Abs: 0.7 10*3/uL (ref 0.7–4.0)
MCH: 29.9 pg (ref 26.0–34.0)
MCHC: 31.1 g/dL (ref 30.0–36.0)
MCV: 96.1 fL (ref 80.0–100.0)
Monocytes Absolute: 0.5 10*3/uL (ref 0.1–1.0)
Monocytes Relative: 3 %
Neutro Abs: 14.3 10*3/uL — ABNORMAL HIGH (ref 1.7–7.7)
Neutrophils Relative %: 90 %
Platelets: 494 10*3/uL — ABNORMAL HIGH (ref 150–400)
RBC: 3.81 MIL/uL — ABNORMAL LOW (ref 4.22–5.81)
RDW: 14.6 % (ref 11.5–15.5)
WBC: 15.8 10*3/uL — ABNORMAL HIGH (ref 4.0–10.5)
nRBC: 0 % (ref 0.0–0.2)

## 2020-08-25 LAB — COMPREHENSIVE METABOLIC PANEL
ALT: 21 U/L (ref 0–44)
AST: 27 U/L (ref 15–41)
Albumin: 3.4 g/dL — ABNORMAL LOW (ref 3.5–5.0)
Alkaline Phosphatase: 64 U/L (ref 38–126)
Anion gap: 9 (ref 5–15)
BUN: 13 mg/dL (ref 8–23)
CO2: 27 mmol/L (ref 22–32)
Calcium: 8.8 mg/dL — ABNORMAL LOW (ref 8.9–10.3)
Chloride: 101 mmol/L (ref 98–111)
Creatinine, Ser: 1.03 mg/dL (ref 0.61–1.24)
GFR, Estimated: >60 mL/min (ref >60)
Glucose, Bld: 118 mg/dL — ABNORMAL HIGH (ref 70–99)
Potassium: 4.4 mmol/L (ref 3.5–5.1)
Sodium: 137 mmol/L (ref 135–145)
Total Bilirubin: 0.7 mg/dL (ref 0.3–1.2)
Total Protein: 7.8 g/dL (ref 6.5–8.1)

## 2020-08-25 LAB — LACTIC ACID, PLASMA
Lactic Acid, Venous: 2.6 mmol/L (ref 0.5–1.9)
Lactic Acid, Venous: 1.9 mmol/L (ref 0.5–1.9)

## 2020-08-25 MED ORDER — UMECLIDINIUM BROMIDE 62.5 MCG/INH IN AEPB
1.0000 | INHALATION_SPRAY | Freq: Every day | RESPIRATORY_TRACT | Status: DC
  Administered 2020-08-26 – 2020-09-03 (×9): 1 via RESPIRATORY_TRACT
  Filled 2020-08-25 (×3): qty 7

## 2020-08-25 MED ORDER — FLUTICASONE FUROATE-VILANTEROL 100-25 MCG/INH IN AEPB
1.0000 | INHALATION_SPRAY | Freq: Every day | RESPIRATORY_TRACT | Status: DC
  Administered 2020-08-26 – 2020-09-03 (×9): 1 via RESPIRATORY_TRACT
  Filled 2020-08-25 (×2): qty 28

## 2020-08-25 MED ORDER — ATORVASTATIN CALCIUM 40 MG PO TABS
40.0000 mg | ORAL_TABLET | Freq: Every day | ORAL | Status: DC
  Administered 2020-08-26 – 2020-09-03 (×9): 40 mg via ORAL
  Filled 2020-08-25 (×9): qty 1

## 2020-08-25 MED ORDER — LOSARTAN POTASSIUM 50 MG PO TABS
50.0000 mg | ORAL_TABLET | Freq: Every day | ORAL | Status: DC
  Administered 2020-08-26 – 2020-09-03 (×9): 50 mg via ORAL
  Filled 2020-08-25 (×9): qty 1

## 2020-08-25 MED ORDER — VANCOMYCIN HCL 1500 MG/300ML IV SOLN
1500.0000 mg | INTRAVENOUS | Status: DC
  Administered 2020-08-26 – 2020-08-28 (×3): 1500 mg via INTRAVENOUS
  Filled 2020-08-25 (×4): qty 300

## 2020-08-25 MED ORDER — LACTATED RINGERS IV BOLUS (SEPSIS)
250.0000 mL | Freq: Once | INTRAVENOUS | Status: AC
  Administered 2020-08-25: 250 mL via INTRAVENOUS

## 2020-08-25 MED ORDER — PIPERACILLIN-TAZOBACTAM 3.375 G IVPB
3.3750 g | Freq: Three times a day (TID) | INTRAVENOUS | Status: DC
  Administered 2020-08-26 (×2): 3.375 g via INTRAVENOUS
  Filled 2020-08-25 (×2): qty 50

## 2020-08-25 MED ORDER — VANCOMYCIN HCL IN DEXTROSE 1-5 GM/200ML-% IV SOLN
1000.0000 mg | Freq: Once | INTRAVENOUS | Status: AC
  Administered 2020-08-25: 1000 mg via INTRAVENOUS
  Filled 2020-08-25: qty 200

## 2020-08-25 MED ORDER — PIPERACILLIN-TAZOBACTAM 3.375 G IVPB 30 MIN
3.3750 g | Freq: Once | INTRAVENOUS | Status: AC
  Administered 2020-08-25: 3.375 g via INTRAVENOUS
  Filled 2020-08-25: qty 50

## 2020-08-25 MED ORDER — PREDNISONE 20 MG PO TABS
20.0000 mg | ORAL_TABLET | Freq: Every day | ORAL | Status: DC
  Administered 2020-08-26 – 2020-08-29 (×4): 20 mg via ORAL
  Filled 2020-08-25 (×4): qty 1

## 2020-08-25 MED ORDER — GABAPENTIN 300 MG PO CAPS
600.0000 mg | ORAL_CAPSULE | Freq: Three times a day (TID) | ORAL | Status: DC
  Administered 2020-08-26 – 2020-09-03 (×25): 600 mg via ORAL
  Filled 2020-08-25 (×25): qty 2

## 2020-08-25 MED ORDER — ALBUTEROL SULFATE (2.5 MG/3ML) 0.083% IN NEBU
2.5000 mg | INHALATION_SOLUTION | Freq: Four times a day (QID) | RESPIRATORY_TRACT | Status: DC | PRN
  Administered 2020-08-26 – 2020-08-31 (×3): 2.5 mg via RESPIRATORY_TRACT
  Filled 2020-08-25 (×3): qty 3

## 2020-08-25 MED ORDER — ACETAMINOPHEN 325 MG PO TABS
650.0000 mg | ORAL_TABLET | Freq: Four times a day (QID) | ORAL | Status: DC | PRN
  Administered 2020-08-28 – 2020-09-02 (×2): 650 mg via ORAL
  Filled 2020-08-25 (×2): qty 2

## 2020-08-25 MED ORDER — FUROSEMIDE 20 MG PO TABS
20.0000 mg | ORAL_TABLET | Freq: Every day | ORAL | Status: DC
  Administered 2020-08-26 – 2020-09-03 (×9): 20 mg via ORAL
  Filled 2020-08-25 (×9): qty 1

## 2020-08-25 MED ORDER — ACETAMINOPHEN 650 MG RE SUPP: 650.0000 mg | Freq: Four times a day (QID) | RECTAL | Status: DC | PRN

## 2020-08-25 NOTE — H&P (Signed)
History and Physical    Joel Perez:[id number] DOB: 1945-11-24 DOA: 08/25/2020  PCP: [name], MD Patient coming from: Home  Chief Complaint: Foot wound  HPI: Joel Perez is a 75 y.o. male with medical history significant of chronic diastolic CHF, IVCD with LBBB, hypertension, chronic venous insufficiency with stasis dermatitis, PAD, neuropathy, COPD, chronic hypoxemic respiratory failure on 4 L home oxygen, obesity, BPH, hospital admission a month ago for severe sepsis secondary to right lower lobe pneumonia and acute COPD exacerbation.  He presented to the ED today for evaluation of worsening right foot wound.  In the ED, afebrile.  Tachycardic but not hypotensive.  Satting well on 4 L home oxygen.  Labs showing WBC 15.8, hemoglobin 11.4 (stable), platelet count 494K.  Sodium 137, potassium 4.4, chloride 101, bicarb 27, BUN 13, creatinine 1.0, glucose 118.  Lactic acid 2.6 > 1.9 before patient was given fluid bolus.  Blood culture x2 pending.  Wound culture pending.  Covid and influenza PCR negative.  X-ray of right foot showing findings concerning for osteomyelitis of the fifth MTP joint. Patient was given vancomycin, Zosyn, and 250 cc LR.  Patient states he is here to get his right foot wound checked out.  He is not sure how long he has had it but today his home health nurse came to do a dressing change and was concerned that it appeared infected and advised him to come into the emergency room to be evaluated.  States sometimes he gets some throbbing pain in this foot due to neuropathy but no pain at present.  Reports chronic shortness of breath secondary to COPD, no recent change.  He uses 4 L home oxygen all the time.  He denies fevers, cough, nausea, vomiting, or abdominal pain.  No other complaints.  Review of Systems:  All systems reviewed and apart from history of presenting illness, are negative.  Past Medical History:  Diagnosis Date  . Blind right eye 1985   Following a  work accident  . BPH (benign prostatic hyperplasia)   . Capsular cataract of left eye   . COPD (chronic obstructive pulmonary disease) (HCC)    Dr. [name]  . Current smoker    Long-term smoker. Not interested in quitting  . Diverticulosis    With intermittent diverticulitis  . Erectile dysfunction   . Essential hypertension    Several recorded blood pressures greater than 140/90.  . H/O mitral valve prolapse    By report, but not confirmed by echo.  . Low back pain    Chronic. Followed by Dr. Suella Broad  . Mild aortic regurgitation 12/2013   Mild to moderate aortic regurgitation on echo  . Neuropathy     Past Surgical History:  Procedure Laterality Date  . COLON SURGERY  2000  . COLONOSCOPY WITH PROPOFOL N/A 03/18/2020   Procedure: COLONOSCOPY WITH PROPOFOL;  Surgeon: Otis Brace, MD;  Location: [facility];  Service: Gastroenterology;  Laterality: N/A;  . ELBOW SURGERY    . POLYPECTOMY  03/18/2020   Procedure: POLYPECTOMY;  Surgeon: Otis Brace, MD;  Location: [facility];  Service: Gastroenterology;;  . TRANSTHORACIC ECHOCARDIOGRAM  12/29/2016   EF 60-65%. Moderate LVH. Mild aortic valve calcification. Mild to moderate regurgitation with no stenosis. Mitral valve annular calcification with no comment of prolapse or regurgitation.     reports that he quit smoking about 2 years ago. His smoking use included cigarettes. He has a 100.00 pack-year smoking history. He has never used smokeless tobacco. He  reports current alcohol use. He reports that he does not use drugs.  Allergies  Allergen Reactions  . Other Swelling and Other (See Comments)    Farmed Fish (tightness in throat & lip swelling)  . Codeine Rash  . Sulfa Antibiotics Hives    Family History  Problem Relation Age of Onset  . Heart disease Father   . Breast cancer Mother   . Other Sister   . Brain cancer Brother     Prior to Admission medications   Medication Sig Start Date End Date Taking?  Authorizing Provider  albuterol (PROVENTIL) (2.5 MG/3ML) 0.083% nebulizer solution USE 1 VIAL IN NEBULIZER EVERY 6 HOURS AS NEEDED FOR WHEEZING AND FOR SHORTNESS OF BREATH Patient taking differently: Take 2.5 mg by nebulization every 6 (six) hours as needed for wheezing or shortness of breath. 01/14/20   Tanda Rockers, MD  aspirin EC 81 MG tablet Take 81 mg by mouth daily. Swallow whole.    [provider]  atorvastatin (LIPITOR) 40 MG tablet TAKE 1 TABLET BY MOUTH ONCE DAILY . APPOINTMENT REQUIRED FOR FUTURE REFILLS Patient taking differently: Take 40 mg by mouth daily. 07/15/20   Wellington Hampshire, MD  Budeson-Glycopyrrol-Formoterol (BREZTRI AEROSPHERE) 160-9-4.8 MCG/ACT AERO Take 2 puffs first thing in am and then another 2 puffs about 12 hours later. 08/13/20   Tanda Rockers, MD  Budeson-Glycopyrrol-Formoterol (BREZTRI AEROSPHERE) 160-9-4.8 MCG/ACT AERO Inhale 2 puffs into the lungs in the morning and at bedtime. 08/13/20   Tanda Rockers, MD  furosemide (LASIX) 20 MG tablet Take 1 tablet by mouth once daily 07/28/20   Wellington Hampshire, MD  gabapentin (NEURONTIN) 600 MG tablet Take 600 mg by mouth every 8 (eight) hours. 02/25/20   [provider]  losartan (COZAAR) 50 MG tablet Take 1 tablet (50 mg total) by mouth daily. 01/08/20   Wellington Hampshire, MD  Multiple Vitamin (MULTIVITAMIN WITH MINERALS) TABS tablet Take 1 tablet by mouth daily.    [provider]  oxyCODONE-acetaminophen (PERCOCET) 10-325 MG tablet Take 1 tablet by mouth 2 (two) times daily as needed (back pain.).     [provider]  predniSONE (DELTASONE) 10 MG tablet 2 until better then one daily 08/13/20   Tanda Rockers, MD  PROAIR HFA 108 [phone] Base) MCG/ACT inhaler INHALE 2 PUFFS BY MOUTH EVERY 6 HOURS AS NEEDED FOR WHEEZING FOR SHORTNESS OF BREATH Patient taking differently: Inhale 2 puffs into the lungs every 6 (six) hours as needed for wheezing or shortness of breath. 07/07/20   Tanda Rockers, MD  sodium chloride (OCEAN) 0.65 % nasal spray Place 1 spray into the nose 4 (four) times daily as needed for congestion.     [provider]  vitamin B-12 (CYANOCOBALAMIN) 1000 MCG tablet Take 1,000 mcg by mouth daily.    [provider]    Physical Exam: Vitals:   08/25/20 2000 08/25/20 2030 08/25/20 2119 08/25/20 2158  BP: 130/63 (!) 122/54  138/62  Pulse: 92 90  86  Resp: 18 18  20   Temp:   98.4 F (36.9 C) 98.2 F (36.8 C)  TempSrc:   Oral   SpO2: 97% 96%  100%  Weight:      Height:        Physical Exam Constitutional:      General: He is not in acute distress.    Comments: Eating food  HENT:     Head: Normocephalic and atraumatic.  Eyes:  Extraocular Movements: Extraocular movements intact.     Conjunctiva/sclera: Conjunctivae normal.  Cardiovascular:     Rate and Rhythm: Normal rate and regular rhythm.     Pulses: Normal pulses.  Pulmonary:     Effort: Pulmonary effort is normal. No respiratory distress.     Breath sounds: No rales.     Comments: Mild scattered wheezing Abdominal:     General: Bowel sounds are normal. There is no distension.     Palpations: Abdomen is soft.     Tenderness: There is no abdominal tenderness.  Musculoskeletal:     Cervical back: Normal range of motion and neck supple.  Skin:    General: Skin is warm and dry.     Comments: Bilateral lower legs erythematous and slightly edematous. Right foot: Moist skin on the dorsal surface of the foot.  Ulcer on the plantar surface of the foot in the fifth metatarsal region with odor, purulent drainage, erythema, and edema.  No tenderness.  Difficult to appreciate dorsalis pedis pulse on palpation but foot warm to touch.  Neurological:     Mental Status: He is alert.             Labs on Admission: I have personally reviewed following labs and imaging studies  CBC: Recent Labs  Lab 08/25/20 1619  WBC 15.8*  NEUTROABS 14.3*  HGB 11.4*  HCT 36.6*  MCV 96.1   PLT 742*   Basic Metabolic Panel: Recent Labs  Lab 08/25/20 1619  NA 137  K 4.4  CL 101  CO2 27  GLUCOSE 118*  BUN 13  CREATININE 1.03  CALCIUM 8.8*   GFR: Estimated Creatinine Clearance: 74.8 mL/min (by C-G formula based on SCr of 1.03 mg/dL). Liver Function Tests: Recent Labs  Lab 08/25/20 1619  AST 27  ALT 21  ALKPHOS 64  BILITOT 0.7  PROT 7.8  ALBUMIN 3.4*   No results for input(s): LIPASE, AMYLASE in the last 168 hours. No results for input(s): AMMONIA in the last 168 hours. Coagulation Profile: No results for input(s): INR, PROTIME in the last 168 hours. Cardiac Enzymes: No results for input(s): CKTOTAL, CKMB, CKMBINDEX, TROPONINI in the last 168 hours. BNP (last 3 results) No results for input(s): PROBNP in the last 8760 hours. HbA1C: No results for input(s): HGBA1C in the last 72 hours. CBG: No results for input(s): GLUCAP in the last 168 hours. Lipid Profile: No results for input(s): CHOL, HDL, LDLCALC, TRIG, CHOLHDL, LDLDIRECT in the last 72 hours. Thyroid Function Tests: No results for input(s): TSH, T4TOTAL, FREET4, T3FREE, THYROIDAB in the last 72 hours. Anemia Panel: No results for input(s): VITAMINB12, FOLATE, FERRITIN, TIBC, IRON, RETICCTPCT in the last 72 hours. Urine analysis:    Component Value Date/Time   COLORURINE YELLOW 08/25/2020 1620   APPEARANCEUR CLEAR 08/25/2020 1620   LABSPEC 1.008 08/25/2020 1620   PHURINE 8.0 08/25/2020 1620   GLUCOSEU NEGATIVE 08/25/2020 1620   HGBUR SMALL (A) 08/25/2020 1620   BILIRUBINUR NEGATIVE 08/25/2020 1620   KETONESUR NEGATIVE 08/25/2020 1620   PROTEINUR NEGATIVE 08/25/2020 1620   NITRITE NEGATIVE 08/25/2020 1620   LEUKOCYTESUR NEGATIVE 08/25/2020 1620    Radiological Exams on Admission: DG Foot Complete Right  Result Date: 08/25/2020 CLINICAL DATA:  75 year old male with concern for infection. EXAM: RIGHT FOOT COMPLETE - 3+ VIEW COMPARISON:  None. FINDINGS: There is no acute fracture or  dislocation. Faint lucency of the base of the proximal phalanx of the fifth digit and fifth metatarsal head may represent an infectious  process/osteomyelitis. MRI or a white blood cell nuclear scan may provide better evaluation. There is diffuse soft tissue swelling primarily involving the dorsum of the foot. Skin ulcer noted along the lateral forefoot adjacent to the fifth MTP joint. No radiopaque foreign object. IMPRESSION: 1. Findings concerning for osteomyelitis at the fifth MTP joint. MRI or a white blood cell nuclear scan may provide better evaluation. 2. Diffuse soft tissue swelling. Electronically Signed   By: Anner Crete M.D.   On: 08/25/2020 17:43    EKG: Independently reviewed.  Sinus tachycardia, incomplete RBBB.  Assessment/Plan Principal Problem:   Osteomyelitis (HCC) Active Problems:   COPD III with marked reversibility   Chronic respiratory failure with hypoxia (HCC)   PVD (peripheral vascular disease) (HCC)   Sepsis (HCC)   Sepsis secondary to right foot fifth MTP joint osteomyelitis Tachycardic and labs showing leukocytosis.  Mild lactic acidosis resolved before patient was given fluid bolus.  X-ray of right foot showing findings concerning for osteomyelitis of the fifth MTP joint.  Tachycardia has now resolved and blood pressure stable. -Continue vancomycin and Zosyn.  Wound and blood cultures pending.  Continue to monitor WBC count.  Consult orthopedics in the morning.  Chronic diastolic CHF He has mild edema of bilateral lower extremities. -Continue home Lasix 20 mg daily  PAD -Hold aspirin.  Continue statin.  COPD Chronic hypoxemic respiratory failure Mild scattered wheezing appreciated upon auscultation of the lungs, no signs of respiratory distress and stable on 4 L home oxygen.  Followed by pulmonology and currently on prednisone. -Continue prednisone and home inhalers, albuterol nebulizer as needed.  Continue supplemental oxygen.  Hypertension Blood  pressure stable. -Continue home Lasix and losartan  DVT prophylaxis: SCDs Code Status: Patient wishes to be full code. Family Communication: No family available at this time. Disposition Plan: Status is: Inpatient  Remains inpatient appropriate because:Inpatient level of care appropriate due to severity of illness   Dispo: The patient is from: Home              Anticipated d/c is to: Home              Patient currently is not medically stable to d/c.   Difficult to place patient No  Level of care: Telemetry  The medical decision making on this patient was of high complexity and the patient is at high risk for clinical deterioration, therefore this is a level 3 visit.  Shela Leff MD [facility]  If 7PM-7AM, please contact night-coverage [email or web address]  08/25/2020, 11:43 PM

## 2020-08-25 NOTE — ED Triage Notes (Signed)
Patient states he is on home O2 4L/min via [place] at home.  Patient states a home health nurse came and changed his right foot wound today. Home health nurse noted a foul odor and wound was worse.

## 2020-08-25 NOTE — Progress Notes (Signed)
Pharmacy Antibiotic Note  Joel Perez is a 75 y.o. male admitted on 08/25/2020 with history of COPD with chronic hypoxemia on 4 L O2, chronic diastolic CHF, IVCD, chronic venous insufficiency with stasis dermatitis, PAD, neuropathy, recent hospitalization for pneumonia 1 month ago presents to the ED from home for evaluation of worsening right foot wound for the last 2 days  Pharmacy has been consulted for vancomycin and zosyn dosing.  vanc 1gm and zosyn 3.375g given in ED  Plan: Zosyn 3.375g IV Q8H infused over 4hrs. Vancomycin 1500mg  IV q24h (AUC 472, Scr 1.0) Daily Scr Follow renal function, cultures and clinical course   Height: 5\' 10"  (177.8 cm) Weight: 110.7 kg (244 lb) IBW/kg (Calculated) : 73  Temp (24hrs), Avg:98.3 F (36.8 C), Min:98.1 F (36.7 C), Max:98.5 F (36.9 C)  Recent Labs  Lab 08/25/20 1619 08/25/20 1746  WBC 15.8*  --   CREATININE 1.03  --   LATICACIDVEN 2.6* 1.9    Estimated Creatinine Clearance: 74.8 mL/min (by C-G formula based on SCr of 1.03 mg/dL).    Allergies  Allergen Reactions  . Other Swelling and Other (See Comments)    Farmed Fish (tightness in throat & lip swelling)  . Codeine Rash  . Sulfa Antibiotics Hives    Antimicrobials this admission: 4/4 vanc >> 4/4 zosyn >> Dose adjustments this admission:   Microbiology results: 4/4 BCx:    Thank you for allowing pharmacy to be a part of this patient's care.  Dolly Rias RPh 08/25/2020, 9:57 PM

## 2020-08-25 NOTE — ED Provider Notes (Signed)
[facility] DEPT Provider Note   CSN: [id number] Arrival date & time: 08/25/20  1530     History Chief Complaint  Patient presents with  . foot wound    Joel Perez is a 75 y.o. male with history of COPD with chronic hypoxemia on 4 L O2, chronic diastolic CHF, IVCD, chronic venous insufficiency with stasis dermatitis, PAD, neuropathy, recent hospitalization for pneumonia 1 month ago presents to the ED from home for evaluation of worsening right foot wound for the last 2 days.  Reports having this wound for about 2 to 3 weeks.  Has home health nurse coming to his house to check the wound and applied dressing.  Has noticed increased redness, warmth, odor and yellow discharge.  Denies any fevers, chills.  Reports decreased sensation in his foot and has not noticed any pain in the area.  Denies history of diabetes.  He thinks at some point he bumped his foot on something weeks ago which caused the wound originally.  Reports since hospitalization for pneumonia 1 month ago his cough has overall improved but still lingering.  His sputum however is now "milky" and better.  Reports prior to his hospitalization he was able to walk approximately 50 feet but now since his discharge 1 month ago he can only really take a few steps before he has to slow down and take deep breaths.  Reports his breathing has improved but very slowly.  HPI     Past Medical History:  Diagnosis Date  . 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 BPH (benign prostatic hyperplasia)   . Capsular cataract of left eye   . COPD (chronic obstructive pulmonary disease) (HCC)    Dr. [name]  . Current smoker    Long-term smoker. Not interested in quitting  . Diverticulosis    With intermittent diverticulitis  . Erectile dysfunction   . Essential hypertension    Several recorded blood pressures greater than 140/90.  . H/O mitral valve prolapse    By report, but not confirmed by echo.  .  Low back pain    Chronic. Followed by Dr. Suella Broad  . Mild aortic regurgitation 12/2013   Mild to moderate aortic regurgitation on echo  . Neuropathy     Patient Active Problem List   Diagnosis Date Noted  . Multiple pulmonary nodules determined by computed tomography of lung 08/14/2020  . Community acquired pneumonia of right lower lobe of lung   . Pressure injury of skin 07/23/2020  . Sepsis ([place]) 07/22/2020  . Edema of both legs 12/19/2018  . PVD (peripheral vascular disease) ([place]) 12/19/2018  . DOE (dyspnea on exertion) 11/14/2018  . Chronic respiratory failure with hypoxia ([place]) 04/06/2018  . Decreased pedal pulses 01/17/2017  . Mild aortic valve regurgitation 01/17/2017  . Leg swelling 12/30/2016  . Neuropathy, alcoholic ([place]) 74/25/9563  . COPD III with marked reversibility 12/27/2012    Past Surgical History:  Procedure Laterality Date  . COLON SURGERY  2000  . COLONOSCOPY WITH PROPOFOL N/A 03/18/2020   Procedure: COLONOSCOPY WITH PROPOFOL;  Surgeon: Otis Brace, MD;  Location: [facility];  Service: Gastroenterology;  Laterality: N/A;  . ELBOW SURGERY    . POLYPECTOMY  03/18/2020   Procedure: POLYPECTOMY;  Surgeon: Otis Brace, MD;  Location: [facility];  Service: Gastroenterology;;  . TRANSTHORACIC ECHOCARDIOGRAM  12/29/2016   EF 60-65%. Moderate LVH. Mild aortic valve calcification. Mild to moderate regurgitation with no stenosis. Mitral valve annular  calcification with no comment of prolapse or regurgitation.       Family History  Problem Relation Age of Onset  . Heart disease Father   . Breast cancer Mother   . Other Sister   . Brain cancer Brother     Social History   Tobacco Use  . Smoking status: Former Smoker    Packs/day: 2.00    Years: 50.00    Pack years: 100.00    Types: Cigarettes    Quit date: 03/16/2018    Years since quitting: 2.4  . Smokeless tobacco: Never Used  Vaping Use  . Vaping Use: Some days  . Start date:  03/16/2018  Substance Use Topics  . Alcohol use: Yes    Alcohol/week: 0.0 standard drinks  . Drug use: No    Home Medications Prior to Admission medications   Medication Sig Start Date End Date Taking? Authorizing Provider  albuterol (PROVENTIL) (2.5 MG/3ML) 0.083% nebulizer solution USE 1 VIAL IN NEBULIZER EVERY 6 HOURS AS NEEDED FOR WHEEZING AND FOR SHORTNESS OF BREATH Patient taking differently: Take 2.5 mg by nebulization every 6 (six) hours as needed for wheezing or shortness of breath. 01/14/20   Tanda Rockers, MD  aspirin EC 81 MG tablet Take 81 mg by mouth daily. Swallow whole.    [provider]  atorvastatin (LIPITOR) 40 MG tablet TAKE 1 TABLET BY MOUTH ONCE DAILY . APPOINTMENT REQUIRED FOR FUTURE REFILLS Patient taking differently: Take 40 mg by mouth daily. 07/15/20   Wellington Hampshire, MD  Budeson-Glycopyrrol-Formoterol (BREZTRI AEROSPHERE) 160-9-4.8 MCG/ACT AERO Take 2 puffs first thing in am and then another 2 puffs about 12 hours later. 08/13/20   Tanda Rockers, MD  Budeson-Glycopyrrol-Formoterol (BREZTRI AEROSPHERE) 160-9-4.8 MCG/ACT AERO Inhale 2 puffs into the lungs in the morning and at bedtime. 08/13/20   Tanda Rockers, MD  furosemide (LASIX) 20 MG tablet Take 1 tablet by mouth once daily 07/28/20   Wellington Hampshire, MD  gabapentin (NEURONTIN) 600 MG tablet Take 600 mg by mouth every 8 (eight) hours. 02/25/20   [provider]  losartan (COZAAR) 50 MG tablet Take 1 tablet (50 mg total) by mouth daily. 01/08/20   Wellington Hampshire, MD  Multiple Vitamin (MULTIVITAMIN WITH MINERALS) TABS tablet Take 1 tablet by mouth daily.    [provider]  oxyCODONE-acetaminophen (PERCOCET) 10-325 MG tablet Take 1 tablet by mouth 2 (two) times daily as needed (back pain.).     [provider]  predniSONE (DELTASONE) 10 MG tablet 2 until better then one daily 08/13/20   Tanda Rockers, MD  PROAIR HFA 108 [id number] Base) MCG/ACT inhaler INHALE 2 PUFFS BY MOUTH  EVERY 6 HOURS AS NEEDED FOR WHEEZING FOR SHORTNESS OF BREATH Patient taking differently: Inhale 2 puffs into the lungs every 6 (six) hours as needed for wheezing or shortness of breath. 07/07/20   Tanda Rockers, MD  sodium chloride (OCEAN) 0.65 % nasal spray Place 1 spray into the nose 4 (four) times daily as needed for congestion.     [provider]  vitamin B-12 (CYANOCOBALAMIN) 1000 MCG tablet Take 1,000 mcg by mouth daily.    [provider]    Allergies    Other, Codeine, and Sulfa antibiotics  Review of Systems   Review of Systems  Skin: Positive for color change and wound.  All other systems reviewed and are negative.   Physical Exam Updated Vital Signs BP 130/63   Pulse 92  Temp 98.1 F (36.7 C) (Oral)   Resp 18   Ht 5\' 10"  (1.778 m)   Wt 110.7 kg   SpO2 97%   BMI 35.01 kg/m   Physical Exam Vitals and nursing note reviewed.  Constitutional:      General: He is not in acute distress.    Appearance: He is well-developed.     Comments: NAD.  HENT:     Head: Normocephalic and atraumatic.     Right Ear: External ear normal.     Left Ear: External ear normal.     Nose: Nose normal.  Eyes:     General: No scleral icterus.    Conjunctiva/sclera: Conjunctivae normal.  Cardiovascular:     Rate and Rhythm: Normal rate and regular rhythm.     Heart sounds: Normal heart sounds.     Comments: Unable to palpate right DP or PT, will try with doppler. Toes are warm. Skin is tight, erythematous, thickened with skin colored papules throughout bilaterally.  No focal calf tenderness or edema  Pulmonary:     Effort: Pulmonary effort is normal.     Comments: On 4 L [place]. Speaking in full sentences. Does not appear to be in increased work of breathing. Diminished lung sounds in lower lobes, no wheezing or crackles  Musculoskeletal:        General: No deformity. Normal range of motion.     Cervical back: Normal range of motion and neck supple.  Skin:    General:  Skin is warm and dry.     Capillary Refill: Capillary refill takes less than 2 seconds.     Comments: Right foot: approx 3 x 3 cm round ulcerated wound in right 5th submetatarsal with odor, yellow/white drainage, erythema, focal edema. No tenderness. Skin in between toes moist, macerated as well. Large patch of white moist skin on dorsal foot. Thickened yellow discolored toe nails   Neurological:     Mental Status: He is alert and oriented to person, place, and time.  Psychiatric:        Behavior: Behavior normal.        Thought Content: Thought content normal.        Judgment: Judgment normal.              ED Results / Procedures / Treatments   Labs (all labs ordered are listed, but only abnormal results are displayed) Labs Reviewed  LACTIC ACID, PLASMA - Abnormal; Notable for the following components:      Result Value   Lactic Acid, Venous 2.6 (*)    All other components within normal limits  COMPREHENSIVE METABOLIC PANEL - Abnormal; Notable for the following components:   Glucose, Bld 118 (*)    Calcium 8.8 (*)    Albumin 3.4 (*)    All other components within normal limits  CBC WITH DIFFERENTIAL/PLATELET - Abnormal; Notable for the following components:   WBC 15.8 (*)    RBC 3.81 (*)    Hemoglobin 11.4 (*)    HCT 36.6 (*)    Platelets 494 (*)    Neutro Abs 14.3 (*)    Abs Immature Granulocytes 0.25 (*)    All other components within normal limits  URINALYSIS, ROUTINE W REFLEX MICROSCOPIC - Abnormal; Notable for the following components:   Hgb urine dipstick SMALL (*)    All other components within normal limits  RESP PANEL BY RT-PCR (FLU A&B, COVID) ARPGX2  CULTURE, BLOOD (ROUTINE X 2)  CULTURE, BLOOD (ROUTINE X 2)  AEROBIC CULTURE W GRAM STAIN (SUPERFICIAL SPECIMEN)  LACTIC ACID, PLASMA    EKG EKG Interpretation  Date/Time:  Monday August 25 2020 17:51:13 EDT Ventricular Rate:  101 PR Interval:  193 QRS Duration: 119 QT Interval:  357 QTC  Calculation: 463 R Axis:   69 Text Interpretation: Sinus tachycardia Incomplete right bundle branch block Confirmed by Dene Gentry [phone]) on 08/25/2020 6:08:24 PM   Radiology DG Foot Complete Right  Result Date: 08/25/2020 CLINICAL DATA:  75 year old male with concern for infection. EXAM: RIGHT FOOT COMPLETE - 3+ VIEW COMPARISON:  None. FINDINGS: There is no acute fracture or dislocation. Faint lucency of the base of the proximal phalanx of the fifth digit and fifth metatarsal head may represent an infectious process/osteomyelitis. MRI or a white blood cell nuclear scan may provide better evaluation. There is diffuse soft tissue swelling primarily involving the dorsum of the foot. Skin ulcer noted along the lateral forefoot adjacent to the fifth MTP joint. No radiopaque foreign object. IMPRESSION: 1. Findings concerning for osteomyelitis at the fifth MTP joint. MRI or a white blood cell nuclear scan may provide better evaluation. 2. Diffuse soft tissue swelling. Electronically Signed   By: Anner Crete M.D.   On: 08/25/2020 17:43    Procedures .Critical Care Performed by: Kinnie Feil, PA-C Authorized by: Kinnie Feil, PA-C   Critical care provider statement:    Critical care time (minutes):  45   Critical care was necessary to treat or prevent imminent or life-threatening deterioration of the following conditions:  Sepsis   Critical care was time spent personally by me on the following activities:  Discussions with consultants, evaluation of patient's response to treatment, examination of patient, ordering and performing treatments and interventions, ordering and review of laboratory studies, ordering and review of radiographic studies, pulse oximetry, re-evaluation of patient's condition, obtaining history from patient or surrogate, review of old charts and development of treatment plan with patient or surrogate   I assumed direction of critical care for this patient from  another provider in my specialty: no       Medications Ordered in ED Medications  lactated ringers bolus 250 mL (250 mLs Intravenous New Bag/Given 08/25/20 1949)  piperacillin-tazobactam (ZOSYN) IVPB 3.375 g (0 g Intravenous Stopped 08/25/20 1819)  vancomycin (VANCOCIN) IVPB 1000 mg/200 mL premix (0 mg Intravenous Stopped 08/25/20 1947)    ED Course  I have reviewed the triage vital signs and the nursing notes.  Pertinent labs & imaging results that were available during my care of the patient were reviewed by me and considered in my medical decision making (see chart for details).  Clinical Course as of 08/25/20 2043  Mon Aug 25, 2020  1708 Pulse Rate(!): 121 [CG]  1708 WBC(!): 15.8 [CG]  1826 Lactic Acid, Venous(!!): 2.6 [CG]  1827 Creatinine: 1.03 [CG]  1827 Pulse Rate(!): 104 [CG]  1827 BP(!): 141/59 [CG]  1827 DG Foot Complete Right IMPRESSION: 1. Findings concerning for osteomyelitis at the fifth MTP joint. MRI or a white blood cell nuclear scan may provide better evaluation. 2. Diffuse soft tissue swelling [CG]  1829 Last cardiology note march 2022 reviewed  "Noninvasive vascular testing in 2018 showed normal ABI bilaterally. Duplex showed moderate bilateral iliac disease and moderate bilateral common femoral artery disease with mild SFA disease and three-vessel runoff bilaterally. He was treating medically for peripheral arterial disease. " Dr Remi Haggard  [CG]    Clinical Course User Index [CG] Kinnie Feil, PA-C   MDM Rules/Calculators/A&P  75 yo M here for worsening right foot wound.  Clinically appears infected. Tachycardic HR 121, tachypnic RR 22. HD stable. Overall non toxic or critically ill appearing. History of PAD.   EMR, triage and nursing notes reviewed. Additional information obtained from wife at bedside.  Was evaluated by cardiologist recently for PAD, reportedly normal ABIs in 2018.   Ddx - cellulitis, abscess, osteomyelitis. DVT  less likely.   Labs and imaging ordered by me as above. Labs and imaging personally visualized and interpreted  Labs reveal - WBC 15.8, lactic acid 2.6 but repeat 1.9 without intervention.  Hgb 11.4, stable. Other labs essentially unremarkable  Imaging reveals - osteomyelitis at fifth MTP joint.   Presentation consistent with technically severe sepsis given initial lactic although repeat normalized without intervention. 250 cc LR ordered for initial lactic acidosis, started after repeat lactic acid was obtained. No evidence of septic shock, normal creatinine, BP, mentation.   Medicines ordered - zosyn, vancomycin, 250 cc LR  Discussed with EDP Mess[place].  2045: Re-evaluated patient and provided update.  In agreement with admission for ongoing care. No clinical decompensation, stable.   Consults in ED - hospitalist for admission. Will need ortho consult.   Final Clinical Impression(s) / ED Diagnoses Final diagnoses:  Severe sepsis [facility])    Rx / DC Orders ED Discharge Orders    None       Arlean Hopping 08/25/20 2043    Valarie Merino, MD 08/26/20 2329

## 2020-08-25 NOTE — ED Notes (Signed)
ED TO INPATIENT HANDOFF REPORT  Name/Age/Gender Joel Perez 75 y.o. male  Code Status Code Status History    Date Active Date Inactive Code Status Order ID Comments User Context   07/22/2020 1254 07/26/2020 1953 Full Code [id number]  Rodena Goldmann, DO ED   Advance Care Planning Activity    Questions for Most Recent Historical Code Status (Order [id number])       Home/SNF/Other home Chief Complaint Osteomyelitis [facility]) [M86.9]  Level of Care/Admitting Diagnosis ED Disposition    ED Disposition Condition [facility]: [facility] [[id number]]  Level of Care: Telemetry [5]  Admit to tele based on following criteria: Complex arrhythmia (Bradycardia/Tachycardia)  May admit patient to [facility] or [name] if equivalent level of care is available:: Yes  Covid Evaluation: Asymptomatic Screening Protocol (No Symptoms)  Diagnosis: Osteomyelitis [facility]) [[id number]]  Admitting Physician: Shela Leff [[id number]]  Attending Physician: Shela Leff [[id number]]  Estimated length of stay: past midnight tomorrow  Certification:: I certify this patient will need inpatient services for at least 2 midnights       Medical History Past Medical History:  Diagnosis Date  . 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 BPH (benign prostatic hyperplasia)   . Capsular cataract of left eye   . COPD (chronic obstructive pulmonary disease) (HCC)    Dr. [name]  . Current smoker    Long-term smoker. Not interested in quitting  . Diverticulosis    With intermittent diverticulitis  . Erectile dysfunction   . Essential hypertension    Several recorded blood pressures greater than 140/90.  . H/O mitral valve prolapse    By report, but not confirmed by echo.  . Low back pain    Chronic. Followed by Dr. Suella Broad  . Mild aortic regurgitation 12/2013   Mild to moderate aortic regurgitation on echo  . Neuropathy     Allergies Allergies  Allergen  Reactions  . Other Swelling and Other (See Comments)    Farmed Fish (tightness in throat & lip swelling)  . Codeine Rash  . Sulfa Antibiotics Hives    IV Location/Drains/Wounds Patient Lines/Drains/Airways Status    Active Line/Drains/Airways    Name Placement date Placement time Site Days   Peripheral IV 08/25/20 Right Antecubital 08/25/20  1626  Antecubital  less than 1   Pressure Injury 07/22/20 Buttocks Left Stage 2 -  Partial thickness loss of dermis presenting as a shallow open injury with a red, pink wound bed without slough. 07/22/20  1600  -- 34   Pressure Injury 07/23/20 Heel Right Stage 3 -  Full thickness tissue loss. Subcutaneous fat may be visible but bone, tendon or muscle are NOT exposed. 07/23/20  0800  -- 33   Wound / Incision (Open or Dehisced) 07/22/20 Venous stasis ulcer Leg Right;Left;Circumferential;Bilateral Red/weeping 07/22/20  1600  Leg  34          Labs/Imaging Results for orders placed or performed during the hospital encounter of 08/25/20 (from the past 48 hour(s))  Lactic acid, plasma     Status: Abnormal   Collection Time: 08/25/20  4:19 PM  Result Value Ref Range   Lactic Acid, Venous 2.6 (HH) 0.5 - 1.9 mmol/L    Comment: CRITICAL RESULT CALLED TO, READ BACK BY AND VERIFIED WITH: S.WEST, RN AT 1755 ON 04.04.22 BY N.THOMPSON Performed at [facility], [place] [address], [place], [place] [address]   Comprehensive metabolic panel  Status: Abnormal   Collection Time: 08/25/20  4:19 PM  Result Value Ref Range   Sodium 137 135 - 145 mmol/L   Potassium 4.4 3.5 - 5.1 mmol/L   Chloride 101 98 - 111 mmol/L   CO2 27 22 - 32 mmol/L   Glucose, Bld 118 (H) 70 - 99 mg/dL    Comment: Glucose reference range applies only to samples taken after fasting for at least 8 hours.   BUN 13 8 - 23 mg/dL   Creatinine, Ser 1.03 0.61 - 1.24 mg/dL   Calcium 8.8 (L) 8.9 - 10.3 mg/dL   Total Protein 7.8 6.5 - 8.1 g/dL   Albumin 3.4 (L) 3.5 - 5.0 g/dL    AST 27 15 - 41 U/L   ALT 21 0 - 44 U/L   Alkaline Phosphatase 64 38 - 126 U/L   Total Bilirubin 0.7 0.3 - 1.2 mg/dL   GFR, Estimated >60 >60 mL/min    Comment: (NOTE) Calculated using the CKD-EPI Creatinine Equation (2021)    Anion gap 9 5 - 15    Comment: Performed at [facility], [place] [address] [place], [place] [address]  CBC with Differential     Status: Abnormal   Collection Time: 08/25/20  4:19 PM  Result Value Ref Range   WBC 15.8 (H) 4.0 - 10.5 K/uL   RBC 3.81 (L) 4.22 - 5.81 MIL/uL   Hemoglobin 11.4 (L) 13.0 - 17.0 g/dL   HCT 36.6 (L) 39.0 - 52.0 %   MCV 96.1 80.0 - 100.0 fL   MCH 29.9 26.0 - 34.0 pg   MCHC 31.1 30.0 - 36.0 g/dL   RDW 14.6 11.5 - 15.5 %   Platelets 494 (H) 150 - 400 K/uL   nRBC 0.0 0.0 - 0.2 %   Neutrophils Relative % 90 %   Neutro Abs 14.3 (H) 1.7 - 7.7 K/uL   Lymphocytes Relative 5 %   Lymphs Abs 0.7 0.7 - 4.0 K/uL   Monocytes Relative 3 %   Monocytes Absolute 0.5 0.1 - 1.0 K/uL   Eosinophils Relative 0 %   Eosinophils Absolute 0.0 0.0 - 0.5 K/uL   Basophils Relative 0 %   Basophils Absolute 0.0 0.0 - 0.1 K/uL   Immature Granulocytes 2 %   Abs Immature Granulocytes 0.25 (H) 0.00 - 0.07 K/uL    Comment: Performed at [facility], [place] [address] [place], [place] [address]  Urinalysis, Routine w reflex microscopic     Status: Abnormal   Collection Time: 08/25/20  4:20 PM  Result Value Ref Range   Color, Urine YELLOW YELLOW   APPearance CLEAR CLEAR   Specific Gravity, Urine 1.008 1.005 - 1.030   pH 8.0 5.0 - 8.0   Glucose, UA NEGATIVE NEGATIVE mg/dL   Hgb urine dipstick SMALL (A) NEGATIVE   Bilirubin Urine NEGATIVE NEGATIVE   Ketones, ur NEGATIVE NEGATIVE mg/dL   Protein, ur NEGATIVE NEGATIVE mg/dL   Nitrite NEGATIVE NEGATIVE   Leukocytes,Ua NEGATIVE NEGATIVE   RBC / HPF 0-5 0 - 5 RBC/hpf   WBC, UA 0-5 0 - 5 WBC/hpf   Bacteria, UA NONE SEEN NONE SEEN    Comment: Performed at [facility], [place] [address], [place], [place] [address]  Lactic acid, plasma     Status: None   Collection Time: 08/25/20  5:46 PM  Result Value Ref Range   Lactic Acid, Venous 1.9 0.5 - 1.9 mmol/L    Comment: Performed at  [facility], [place] [address] [place], [place] [address]  Resp Panel by RT-PCR (Flu A&B, Covid) Nasopharyngeal Swab     Status: None   Collection Time: 08/25/20  6:43 PM   Specimen: Nasopharyngeal Swab; Nasopharyngeal(NP) swabs in vial transport medium  Result Value Ref Range   SARS Coronavirus 2 by RT PCR NEGATIVE NEGATIVE    Comment: (NOTE) SARS-CoV-2 target nucleic acids are NOT DETECTED.  The SARS-CoV-2 RNA is generally detectable in upper respiratory specimens during the acute phase of infection. The lowest concentration of SARS-CoV-2 viral copies this assay can detect is 138 copies/mL. A negative result does not preclude SARS-Cov-2 infection and should not be used as the sole basis for treatment or other patient management decisions. A negative result may occur with  improper specimen collection/handling, submission of specimen other than nasopharyngeal swab, presence of viral mutation(s) within the areas targeted by this assay, and inadequate number of viral copies(<138 copies/mL). A negative result must be combined with clinical observations, patient history, and epidemiological information. The expected result is Negative.  Fact Sheet for Patients:  EntrepreneurPulse.com.au  Fact Sheet for Healthcare Providers:  IncredibleEmployment.be  This test is no t yet approved or cleared by the [place] FDA and  has been authorized for detection and/or diagnosis of SARS-CoV-2 by FDA under an Emergency Use Authorization (EUA). This EUA will remain  in effect (meaning this test can be used) for the duration of the COVID-19 declaration under Section 564(b)(1) of the Act, 21 U.S.C.section 360bbb-3(b)(1),  unless the authorization is terminated  or revoked sooner.       Influenza A by PCR NEGATIVE NEGATIVE   Influenza B by PCR NEGATIVE NEGATIVE    Comment: (NOTE) The Xpert Xpress SARS-CoV-2/FLU/RSV plus assay is intended as an aid in the diagnosis of influenza from Nasopharyngeal swab specimens and should not be used as a sole basis for treatment. Nasal washings and aspirates are unacceptable for Xpert Xpress SARS-CoV-2/FLU/RSV testing.  Fact Sheet for Patients: EntrepreneurPulse.com.au  Fact Sheet for Healthcare Providers: IncredibleEmployment.be  This test is not yet approved or cleared by the [place] FDA and has been authorized for detection and/or diagnosis of SARS-CoV-2 by FDA under an Emergency Use Authorization (EUA). This EUA will remain in effect (meaning this test can be used) for the duration of the COVID-19 declaration under Section 564(b)(1) of the Act, 21 U.S.C. section 360bbb-3(b)(1), unless the authorization is terminated or revoked.  Performed at [facility], [place] [address], [place], [place] [address]    DG Foot Complete Right  Result Date: 08/25/2020 CLINICAL DATA:  75 year old male with concern for infection. EXAM: RIGHT FOOT COMPLETE - 3+ VIEW COMPARISON:  None. FINDINGS: There is no acute fracture or dislocation. Faint lucency of the base of the proximal phalanx of the fifth digit and fifth metatarsal head may represent an infectious process/osteomyelitis. MRI or a white blood cell nuclear scan may provide better evaluation. There is diffuse soft tissue swelling primarily involving the dorsum of the foot. Skin ulcer noted along the lateral forefoot adjacent to the fifth MTP joint. No radiopaque foreign object. IMPRESSION: 1. Findings concerning for osteomyelitis at the fifth MTP joint. MRI or a white blood cell nuclear scan may provide better evaluation. 2. Diffuse soft tissue swelling. Electronically  Signed   By: Anner [place] M.D.   On: 08/25/2020 17:43    Pending Labs [employer] (From admission, onward)          Start  Ordered   08/25/20 1710  Blood culture (routine x 2)  BLOOD CULTURE X 2,   STAT      08/25/20 1709   08/25/20 1710  Aerobic Culture w Gram Stain (superficial specimen)  Once,   STAT        08/25/20 1709          Vitals/Pain Today's Vitals   08/25/20 1930 08/25/20 2000 08/25/20 2030 08/25/20 2119  BP: 129/60 130/63 (!) 122/54   Pulse: 91 92 90   Resp:  18 18   Temp:    98.4 F (36.9 C)  TempSrc:    Oral  SpO2: 98% 97% 96%   Weight:      Height:      PainSc:        Isolation Precautions No active isolations  Medications Medications  piperacillin-tazobactam (ZOSYN) IVPB 3.375 g (0 g Intravenous Stopped 08/25/20 1819)  vancomycin (VANCOCIN) IVPB 1000 mg/200 mL premix (0 mg Intravenous Stopped 08/25/20 1947)  lactated ringers bolus 250 mL (0 mLs Intravenous Stopped 08/25/20 2049)    Mobility stretcher

## 2020-08-26 ENCOUNTER — Inpatient Hospital Stay (HOSPITAL_COMMUNITY): Payer: Medicare Other

## 2020-08-26 DIAGNOSIS — L0390 Cellulitis, unspecified: Secondary | ICD-10-CM

## 2020-08-26 DIAGNOSIS — J449 Chronic obstructive pulmonary disease, unspecified: Secondary | ICD-10-CM

## 2020-08-26 DIAGNOSIS — I96 Gangrene, not elsewhere classified: Secondary | ICD-10-CM

## 2020-08-26 DIAGNOSIS — L97518 Non-pressure chronic ulcer of other part of right foot with other specified severity: Secondary | ICD-10-CM

## 2020-08-26 DIAGNOSIS — I872 Venous insufficiency (chronic) (peripheral): Secondary | ICD-10-CM

## 2020-08-26 DIAGNOSIS — I10 Essential (primary) hypertension: Secondary | ICD-10-CM

## 2020-08-26 DIAGNOSIS — J9611 Chronic respiratory failure with hypoxia: Secondary | ICD-10-CM

## 2020-08-26 DIAGNOSIS — A419 Sepsis, unspecified organism: Secondary | ICD-10-CM

## 2020-08-26 DIAGNOSIS — I739 Peripheral vascular disease, unspecified: Secondary | ICD-10-CM

## 2020-08-26 DIAGNOSIS — R6520 Severe sepsis without septic shock: Secondary | ICD-10-CM

## 2020-08-26 LAB — RENAL FUNCTION PANEL
Albumin: 2.8 g/dL — ABNORMAL LOW (ref 3.5–5.0)
Anion gap: 7 (ref 5–15)
BUN: 11 mg/dL (ref 8–23)
CO2: 28 mmol/L (ref 22–32)
Calcium: 8.6 mg/dL — ABNORMAL LOW (ref 8.9–10.3)
Chloride: 106 mmol/L (ref 98–111)
Creatinine, Ser: 1.02 mg/dL (ref 0.61–1.24)
GFR, Estimated: >60 mL/min (ref >60)
Glucose, Bld: 160 mg/dL — ABNORMAL HIGH (ref 70–99)
Phosphorus: 1.8 mg/dL — ABNORMAL LOW (ref 2.5–4.6)
Potassium: 4 mmol/L (ref 3.5–5.1)
Sodium: 141 mmol/L (ref 135–145)

## 2020-08-26 LAB — CBC
HCT: 32.2 % — ABNORMAL LOW (ref 39.0–52.0)
Hemoglobin: 10 g/dL — ABNORMAL LOW (ref 13.0–17.0)
MCH: 29.9 pg (ref 26.0–34.0)
MCHC: 31.1 g/dL (ref 30.0–36.0)
MCV: 96.1 fL (ref 80.0–100.0)
Platelets: 431 10*3/uL — ABNORMAL HIGH (ref 150–400)
RBC: 3.35 MIL/uL — ABNORMAL LOW (ref 4.22–5.81)
RDW: 14.6 % (ref 11.5–15.5)
WBC: 11.3 10*3/uL — ABNORMAL HIGH (ref 4.0–10.5)
nRBC: 0 % (ref 0.0–0.2)

## 2020-08-26 LAB — HEMOGLOBIN A1C
Hgb A1c MFr Bld: 6.3 % — ABNORMAL HIGH (ref 4.8–5.6)
Mean Plasma Glucose: 134.11 mg/dL

## 2020-08-26 LAB — SEDIMENTATION RATE: Sed Rate: 51 mm/hr — ABNORMAL HIGH (ref 0–16)

## 2020-08-26 LAB — CREATININE, SERUM
Creatinine, Ser: 0.8 mg/dL (ref 0.61–1.24)
GFR, Estimated: >60 mL/min (ref >60)

## 2020-08-26 LAB — C-REACTIVE PROTEIN: CRP: 5.5 mg/dL — ABNORMAL HIGH (ref <1.0)

## 2020-08-26 MED ORDER — POTASSIUM PHOSPHATES 15 MMOLE/5ML IV SOLN
30.0000 mmol | Freq: Once | INTRAVENOUS | Status: AC
  Administered 2020-08-26: 30 mmol via INTRAVENOUS
  Filled 2020-08-26 (×2): qty 10

## 2020-08-26 MED ORDER — METRONIDAZOLE IN NACL 5-0.79 MG/ML-% IV SOLN
500.0000 mg | Freq: Three times a day (TID) | INTRAVENOUS | Status: DC
  Administered 2020-08-26 – 2020-08-31 (×15): 500 mg via INTRAVENOUS
  Filled 2020-08-26 (×15): qty 100

## 2020-08-26 MED ORDER — SODIUM CHLORIDE 0.9 % IV SOLN
2.0000 g | Freq: Three times a day (TID) | INTRAVENOUS | Status: DC
  Administered 2020-08-26 – 2020-08-31 (×15): 2 g via INTRAVENOUS
  Filled 2020-08-26 (×16): qty 2

## 2020-08-26 NOTE — Progress Notes (Signed)
Patient ID: Joel Perez, male   DOB: 1946/01/30, 75 y.o.   MRN: [id number] Spoke with Dr.Gonfa Romulus Hanrahan this afternoon.  Images and chart reviewed.  Obvious infection in the fifth metatarsal head.  Noninvasive studies reviewed with monophasic flow to the level of the foot.  Will need arteriography to determine revascularization options.  The patient is to be transferred to [facility].  He will be made n.p.o. after midnight tonight and potentially could be placed on the schedule tomorrow if transfer is possible.  Will see in consultation when he arrives at [facility]

## 2020-08-26 NOTE — Progress Notes (Signed)
PROGRESS NOTE  Joel Perez TDD:[id number] DOB: Oct 20, 1945   PCP: [name], MD  Patient is from: Home  DOA: 08/25/2020 LOS: 1  Chief complaints: Right foot ulcer  Brief Narrative / Interim history: 75 year old M with PMH of diastolic CHF, LBBB, PAD, neuropathy, stasis dermatitis, COPD, chronic hypoxic RF on 4 L and obesity presented to ED with worsening right foot wound, and admitted for sepsis due to right fifth MTP joint osteomyelitis and infected right foot ulcer.  He was a started on vancomycin and Zosyn.  Blood cultures NGTD.  Vascular surgery, Dr. Donnetta Hutching consulted and recommended transfer to [facility] for arteriogram and further care.  Subjective: Seen and examined earlier this morning.  No major events overnight of this morning.  Reports "some" pain in right foot.  He describes the pain as throbbing.  Pain is intermittent.  Currently, he rates pain 4/10.  He reports history of neuropathy.  He denies fever, chills or chest pain.  He says his breathing is at his baseline.  He denies GI or UTI symptoms.  Objective: Vitals:   08/26/20 0109 08/26/20 0555 08/26/20 0824 08/26/20 0900  BP: (!) 119/55 133/86  127/63  Pulse: 86 86  83  Resp: 20 20    Temp: 98.3 F (36.8 C) 97.9 F (36.6 C)  97.6 F (36.4 C)  TempSrc:    Oral  SpO2: 97% 99% 96% 100%  Weight:      Height:        Intake/Output Summary (Last 24 hours) at 08/26/2020 1359 Last data filed at 08/26/2020 0659 Gross per 24 hour  Intake 310 ml  Output 1275 ml  Net -965 ml   Filed Weights   08/25/20 1535 08/25/20 1541  Weight: 110.7 kg 110.7 kg    Examination:  GENERAL: No apparent distress.  Nontoxic. HEENT: MMM.  Vision and hearing grossly intact.  NECK: Supple.  No apparent JVD.  RESP: 99% on 4 L.  No IWOB.  Fair aeration bilaterally. CVS:  RRR. Heart sounds normal.  ABD/GI/GU: BS+. Abd soft, NTND.  MSK/EXT:  Moves extremities.  Bilateral stasis dermatitis.  Was not able to palpate DP pulses SKIN: Venous  stasis with right foot ulceration over plantar aspect of right fifth MTP joint.  Calluses in left foot NEURO: Awake, alert and oriented appropriately.  No apparent focal neuro deficit. PSYCH: Calm. Normal affect.  Procedures:  None  Microbiology summarized: [id number] and influenza PCR nonreactive. Blood cultures NGTD. Gram stain on superficial wound specimen with GPC, GNR and few GPR.  Culture pending.  Assessment & Plan: Severe sepsis due to right foot fifth MTP joint osteomyelitis and infected right foot ulcer: POA.  Met criteria with leukocytosis, tachycardia and lactic acidosis.  Culture data as above.  He has history of PAD with nonpalpable DP pulses on my exam.  No history of diabetes. -Continue vancomycin -Change Zosyn to cefepime and Flagyl -Vascular surgery consulted.  Recommended transfer to [facility] for further evaluation and treatment. -Check ABI, CRP, ESR and hemoglobin A1c -Okay to feed today.  We will keep n.p.o. after midnight  Chronic diastolic CHF TTE on 07/23/5174 with LVEF of 60 to 65%, mild LVH and indeterminate DD.  Difficult to assess fluid status in his legs due to chronic venous insufficiency.  Overall, he appears euvolemic. -Continue home Lasix 20 mg daily -Monitor fluid and renal status  PAD: Difficult to palpate DP pulses. -Continue statin. -Aspirin on hold for surgery -ABI  Chronic COPD/chronic hypoxic respiratory failure: Stable on  home 4 L.  Also on prednisone 20 mg chronically.  Followed by pulmonology. -Continue home prednisone but may affect wound healing after surgery -Continue home inhalers, albuterol nebulizer as needed.   -Continue supplemental oxygen.  Essential hypertension: Normotensive. -Continue home Lasix and losartan  Neuropathy -Continue home gabapentin.  Class II obesity Body mass index is 35.01 kg/m.       Pressure Injury 07/22/20 Buttocks Left Stage 2 -  Partial thickness loss of dermis presenting as a shallow open  injury with a red, pink wound bed without slough. (Active)  07/22/20 1600  Location: Buttocks  Location Orientation: Left  Staging: Stage 2 -  Partial thickness loss of dermis presenting as a shallow open injury with a red, pink wound bed without slough.  Wound Description (Comments):   Present on Admission: Yes     Pressure Injury 07/23/20 Heel Right Stage 3 -  Full thickness tissue loss. Subcutaneous fat may be visible but bone, tendon or muscle are NOT exposed. (Active)  07/23/20 0800  Location: Heel  Location Orientation: Right  Staging: Stage 3 -  Full thickness tissue loss. Subcutaneous fat may be visible but bone, tendon or muscle are NOT exposed.  Wound Description (Comments):   Present on Admission: Yes   DVT prophylaxis:  SCDs Start: 08/25/20 2132  Code Status: Full code Family Communication: Patient and/or RN.  Attempted to call patient's wife but no answer. Level of care: Telemetry Medical Status is: Inpatient  Remains inpatient appropriate because:Ongoing diagnostic testing needed not appropriate for outpatient work up, Unsafe d/c plan, IV treatments appropriate due to intensity of illness or inability to take PO and Inpatient level of care appropriate due to severity of illness   Dispo: The patient is from: Home              Anticipated d/c is to: Home              Patient currently is not medically stable to d/c.   Difficult to place patient No       Consultants:  Vascular surgery, Dr. Donnetta Hutching   Sch Meds:  Scheduled Meds: . atorvastatin  40 mg Oral Daily  . fluticasone furoate-vilanterol  1 puff Inhalation Daily  . furosemide  20 mg Oral Daily  . gabapentin  600 mg Oral Q8H  . losartan  50 mg Oral Daily  . predniSONE  20 mg Oral Q breakfast  . umeclidinium bromide  1 puff Inhalation Daily   Continuous Infusions: . metronidazole    . vancomycin     PRN Meds:.acetaminophen **OR** acetaminophen, albuterol  Antimicrobials: Anti-infectives (From  admission, onward)   Start     Dose/Rate Route Frequency Ordered Stop   08/26/20 1400  metroNIDAZOLE (FLAGYL) IVPB 500 mg        500 mg 100 mL/hr over 60 Minutes Intravenous Every 8 hours 08/26/20 1351     08/26/20 1200  vancomycin (VANCOREADY) IVPB 1500 mg/300 mL        1,500 mg 150 mL/hr over 120 Minutes Intravenous Every 24 hours 08/25/20 2158     08/26/20 0200  piperacillin-tazobactam (ZOSYN) IVPB 3.375 g  Status:  Discontinued        3.375 g 12.5 mL/hr over 240 Minutes Intravenous Every 8 hours 08/25/20 2148 08/26/20 1351   08/25/20 1715  piperacillin-tazobactam (ZOSYN) IVPB 3.375 g        3.375 g 100 mL/hr over 30 Minutes Intravenous  Once 08/25/20 1708 08/25/20 1819   08/25/20 1715  vancomycin (  VANCOCIN) IVPB 1000 mg/200 mL premix        1,000 mg 200 mL/hr over 60 Minutes Intravenous  Once 08/25/20 1708 08/25/20 1947       I have personally reviewed the following labs and images: CBC: Recent Labs  Lab 08/25/20 1619 08/26/20 0344  WBC 15.8* 11.3*  NEUTROABS 14.3*  --   HGB 11.4* 10.0*  HCT 36.6* 32.2*  MCV 96.1 96.1  PLT 494* 431*   BMP &GFR Recent Labs  Lab 08/25/20 1619 08/26/20 0344  NA 137  --   K 4.4  --   CL 101  --   CO2 27  --   GLUCOSE 118*  --   BUN 13  --   CREATININE 1.03 0.80  CALCIUM 8.8*  --    Estimated Creatinine Clearance: 96.4 mL/min (by C-G formula based on SCr of 0.8 mg/dL). Liver & Pancreas: Recent Labs  Lab 08/25/20 1619  AST 27  ALT 21  ALKPHOS 64  BILITOT 0.7  PROT 7.8  ALBUMIN 3.4*   No results for input(s): LIPASE, AMYLASE in the last 168 hours. No results for input(s): AMMONIA in the last 168 hours. Diabetic: No results for input(s): HGBA1C in the last 72 hours. No results for input(s): GLUCAP in the last 168 hours. Cardiac Enzymes: No results for input(s): CKTOTAL, CKMB, CKMBINDEX, TROPONINI in the last 168 hours. No results for input(s): PROBNP in the last 8760 hours. Coagulation Profile: No results for input(s):  INR, PROTIME in the last 168 hours. Thyroid Function Tests: No results for input(s): TSH, T4TOTAL, FREET4, T3FREE, THYROIDAB in the last 72 hours. Lipid Profile: No results for input(s): CHOL, HDL, LDLCALC, TRIG, CHOLHDL, LDLDIRECT in the last 72 hours. Anemia Panel: No results for input(s): VITAMINB12, FOLATE, FERRITIN, TIBC, IRON, RETICCTPCT in the last 72 hours. Urine analysis:    Component Value Date/Time   COLORURINE YELLOW 08/25/2020 1620   APPEARANCEUR CLEAR 08/25/2020 1620   LABSPEC 1.008 08/25/2020 1620   PHURINE 8.0 08/25/2020 1620   GLUCOSEU NEGATIVE 08/25/2020 1620   HGBUR SMALL (A) 08/25/2020 1620   BILIRUBINUR NEGATIVE 08/25/2020 1620   KETONESUR NEGATIVE 08/25/2020 1620   PROTEINUR NEGATIVE 08/25/2020 1620   NITRITE NEGATIVE 08/25/2020 1620   LEUKOCYTESUR NEGATIVE 08/25/2020 1620   Sepsis Labs: Invalid input(s): PROCALCITONIN, [place]  Microbiology: Recent Results (from the past 240 hour(s))  Blood culture (routine x 2)     Status: None (Preliminary result)   Collection Time: 08/25/20  5:15 PM   Specimen: BLOOD  Result Value Ref Range Status   Specimen Description   Final    BLOOD LEFT ANTECUBITAL Performed at [facility] [address] [place], [place] [address]    Special Requests   Final    BOTTLES DRAWN AEROBIC ONLY Blood Culture results may not be optimal due to an inadequate volume of blood received in culture bottles Performed at [facility] [address], [place], [place] [address]    Culture   Final    NO GROWTH < 12 HOURS Performed at [facility] [address], [place],  [address]    Report Status PENDING  Incomplete  Blood culture (routine x 2)     Status: None (Preliminary result)   Collection Time: 08/25/20  5:26 PM   Specimen: BLOOD  Result Value Ref Range Status   Specimen Description   Final    BLOOD RIGHT ANTECUBITAL Performed at [facility]  [address]  [place], [place] [address]    Special Requests   Final    BOTTLES DRAWN AEROBIC AND ANAEROBIC Blood Culture adequate volume Performed at [facility] [address] [place], [place] [address]    Culture   Final    NO GROWTH < 12 HOURS Performed at [facility] [address] [place], [place] [address]    Report Status PENDING  Incomplete  Aerobic Culture w Gram Stain (superficial specimen)     Status: None (Preliminary result)   Collection Time: 08/25/20  5:26 PM   Specimen: Foot  Result Value Ref Range Status   Specimen Description   Final    FOOT RIGHT Performed at [facility] [address] [place], [place] [address]    Special Requests   Final    NONE Performed at [facility], [place] [address], [place], [place] [address]    Gram Stain   Final    FEW WBC PRESENT, PREDOMINANTLY PMN ABUNDANT GRAM POSITIVE COCCI IN PAIRS IN CLUSTERS ABUNDANT GRAM NEGATIVE RODS FEW GRAM POSITIVE RODS Performed at [facility], [place] [address] [place], [place] [address]    Culture PENDING  Incomplete   Report Status PENDING  Incomplete  Resp Panel by RT-PCR (Flu A&B, Covid) Nasopharyngeal Swab     Status: None   Collection Time: 08/25/20  6:43 PM   Specimen: Nasopharyngeal Swab; Nasopharyngeal(NP) swabs in vial transport medium  Result Value Ref Range Status   SARS Coronavirus 2 by RT PCR NEGATIVE NEGATIVE Final    Comment: (NOTE) SARS-CoV-2 target nucleic acids are NOT DETECTED.  The SARS-CoV-2 RNA is generally detectable in upper respiratory specimens during the acute phase of infection. The lowest concentration of SARS-CoV-2 viral copies this assay can detect is 138 copies/mL. A negative result does not preclude SARS-Cov-2 infection and should not be used as the sole basis for treatment or other patient management decisions. A negative result may occur with  improper specimen collection/handling,  submission of specimen other than nasopharyngeal swab, presence of viral mutation(s) within the areas targeted by this assay, and inadequate number of viral copies(<138 copies/mL). A negative result must be combined with clinical observations, patient history, and epidemiological information. The expected result is Negative.  Fact Sheet for Patients:  EntrepreneurPulse.com.au  Fact Sheet for Healthcare Providers:  IncredibleEmployment.be  This test is no t yet approved or cleared by the [place] FDA and  has been authorized for detection and/or diagnosis of SARS-CoV-2 by FDA under an Emergency Use Authorization (EUA). This EUA will remain  in effect (meaning this test can be used) for the duration of the COVID-19 declaration under Section 564(b)(1) of the Act, 21 U.S.C.section 360bbb-3(b)(1), unless the authorization is terminated  or revoked sooner.       Influenza A by PCR NEGATIVE NEGATIVE Final   Influenza B by PCR NEGATIVE NEGATIVE Final    Comment: (NOTE) The Xpert Xpress SARS-CoV-2/FLU/RSV plus assay is intended as an aid in the diagnosis of influenza from Nasopharyngeal swab specimens and should not be used as a sole basis for treatment. Nasal washings and aspirates are unacceptable for Xpert Xpress SARS-CoV-2/FLU/RSV testing.  Fact Sheet for Patients: EntrepreneurPulse.com.au  Fact Sheet for Healthcare Providers: IncredibleEmployment.be  This test is not yet approved or cleared by the [place] FDA and has been authorized for detection and/or diagnosis of SARS-CoV-2 by FDA under an Emergency Use Authorization (EUA). This EUA will remain in effect (meaning this test can  be used) for the duration of the COVID-19 declaration under Section 564(b)(1) of the Act, 21 U.S.C. section 360bbb-3(b)(1), unless the authorization is terminated or revoked.  Performed at [facility], [place] [address] [place], [place] [address]     Radiology Studies: DG Foot Complete Right  Result Date: 08/25/2020 CLINICAL DATA:  75 year old male with concern for infection. EXAM: RIGHT FOOT COMPLETE - 3+ VIEW COMPARISON:  None. FINDINGS: There is no acute fracture or dislocation. Faint lucency of the base of the proximal phalanx of the fifth digit and fifth metatarsal head may represent an infectious process/osteomyelitis. MRI or a white blood cell nuclear scan may provide better evaluation. There is diffuse soft tissue swelling primarily involving the dorsum of the foot. Skin ulcer noted along the lateral forefoot adjacent to the fifth MTP joint. No radiopaque foreign object. IMPRESSION: 1. Findings concerning for osteomyelitis at the fifth MTP joint. MRI or a white blood cell nuclear scan may provide better evaluation. 2. Diffuse soft tissue swelling. Electronically Signed   By: Anner Crete M.D.   On: 08/25/2020 17:43   VAS [place] ABI WITH/WO TBI  Result Date: 08/26/2020 LOWER EXTREMITY DOPPLER STUDY Indications: Ulceration, and gangrene. High Risk Factors: Hypertension, Diabetes.  Limitations: Today's exam was limited due to involuntary patient movement. Comparison Study: no prior Performing Technologist: Abram Sander RVS  Examination Guidelines: A complete evaluation includes at minimum, Doppler waveform signals and systolic blood pressure reading at the level of bilateral brachial, anterior tibial, and posterior tibial arteries, when vessel segments are accessible. Bilateral testing is considered an integral part of a complete examination. Photoelectric Plethysmograph (PPG) waveforms and toe systolic pressure readings are included as required and additional duplex testing as needed. Limited examinations for reoccurring indications may be performed as noted.  ABI Findings: +--------+------------------+-----+----------+--------+ Right   Rt Pressure (mmHg)IndexWaveform  Comment   +--------+------------------+-----+----------+--------+ [id number]                    triphasic          +--------+------------------+-----+----------+--------+ PTA     88                0.73 monophasic         +--------+------------------+-----+----------+--------+ DP      52                0.43 monophasic         +--------+------------------+-----+----------+--------+ +--------+------------------+-----+----------+-------+ Left    Lt Pressure (mmHg)IndexWaveform  Comment +--------+------------------+-----+----------+-------+ [id number]                    triphasic         +--------+------------------+-----+----------+-------+ PTA     74                0.62 monophasic        +--------+------------------+-----+----------+-------+ DP      88                0.73 monophasic        +--------+------------------+-----+----------+-------+ +-------+-----------+-----------+------------+------------+ ABI/TBIToday's ABIToday's TBIPrevious ABIPrevious TBI +-------+-----------+-----------+------------+------------+ Right  0.73                                           +-------+-----------+-----------+------------+------------+ Left   0.73                                           +-------+-----------+-----------+------------+------------+  Summary: Right: Resting right ankle-brachial index indicates moderate right lower extremity arterial disease. Left: Resting left ankle-brachial index indicates moderate left lower extremity arterial disease.  *See table(s) above for measurements and observations.    Preliminary      Keyden Pavlov T. Chillum  If 7PM-7AM, please contact night-coverage [email or web address] 08/26/2020, 1:59 PM

## 2020-08-26 NOTE — Progress Notes (Signed)
ABI has been completed.   Preliminary results in CV Proc.   Abram Sander 08/26/2020 1:04 PM

## 2020-08-26 NOTE — Progress Notes (Signed)
Pharmacy Antibiotic Note  Joel Perez is a 75 y.o. male admitted on 08/25/2020 with history of COPD with chronic hypoxemia on 4 L O2, chronic diastolic CHF, IVCD, chronic venous insufficiency with stasis dermatitis, PAD, neuropathy, recent hospitalization for pneumonia 1 month ago presents to the ED from home for evaluation of worsening right foot wound for the last 2 days  Pharmacy has been consulted for vancomycin and zosyn dosing.  vanc 1gm and zosyn 3.375g given in ED   Cefepime 2 gr IV q8h  Metronidazole 500 mg IV q8h   Vancomycin 1500mg  IV q24h (AUC 472, Scr 1.0)  Follow renal function, cultures and clinical course   Height: 5\' 10"  (177.8 cm) Weight: 110.7 kg (244 lb) IBW/kg (Calculated) : 73  Temp (24hrs), Avg:98.1 F (36.7 C), Min:97.6 F (36.4 C), Max:98.5 F (36.9 C)  Recent Labs  Lab 08/25/20 1619 08/25/20 1746 08/26/20 0344  WBC 15.8*  --  11.3*  CREATININE 1.03  --  0.80  LATICACIDVEN 2.6* 1.9  --     Estimated Creatinine Clearance: 96.4 mL/min (by C-G formula based on SCr of 0.8 mg/dL).    Allergies  Allergen Reactions  . Other Swelling and Other (See Comments)    Farmed Fish (tightness in throat & lip swelling)  . Codeine Rash  . Sulfa Antibiotics Hives    Antimicrobials this admission: 4/4 vanc >> 4/4 zosyn >> 4/5 4/5 Metronidazole >>  4/5 cefepime >>   Dose adjustments this admission:   Microbiology results: 4/4 BCx: NGTD  4/4 foot culture: abundant GPC in pairs and clusters, abundant GNR, few GPR  Thank you for allowing pharmacy to be a part of this patient's care.   Royetta Asal, PharmD, BCPS 08/26/2020 2:02 PM

## 2020-08-26 NOTE — Plan of Care (Signed)
Joel Perez DGL:[id number] DOB: 09-09-1945 DOA: 08/25/2020   PCP: [name], MD      Patient arrived to ER on 08/25/20 at 1530 Referred by Attending Mercy Riding, MD   Patient coming from: home Lives alone,    Chief Complaint:   Chief Complaint  Patient presents with  . foot wound    HPI: Joel Perez is a 75 y.o. male with medical history significant of diastolic CHF, LBBB, PAD, neuropathy, stasis dermatitis, COPD, chronic hypoxic RF on 4 L and obesity     Presented to [facility] with  worsening right foot wound, and admitted for sepsis due to right fifth MTP joint osteomyelitis and infected right foot ulcer.  He was a started on vancomycin and Zosyn.  Blood cultures NGTD.  Vascular surgery, Dr. Donnetta Hutching consulted and recommended transfer to [facility] for arteriogram and further care.      ED Triage Vitals  Enc Vitals Group     BP 08/25/20 1535 (!) 167/94     Pulse Rate 08/25/20 1535 (!) 121     Resp 08/25/20 1535 (!) 22     Temp 08/25/20 1535 98.5 F (36.9 C)     Temp Source 08/25/20 1535 Oral     SpO2 08/25/20 1535 95 %     Weight 08/25/20 1535 244 lb (110.7 kg)     Height 08/25/20 1535 5\' 10"  (1.778 m)     Head Circumference --      Peak Flow --      Pain Score 08/25/20 1540 6     Pain Loc --      Pain Edu? --      Excl. in [place]? --   TMAX(24)@     _________________________________________ Significant initial  Findings: Abnormal Labs Reviewed  LACTIC ACID, PLASMA - Abnormal; Notable for the following components:      Result Value   Lactic Acid, Venous 2.6 (*)    All other components within normal limits  COMPREHENSIVE METABOLIC PANEL - Abnormal; Notable for the following components:   Glucose, Bld 118 (*)    Calcium 8.8 (*)    Albumin 3.4 (*)    All other components within normal limits  CBC WITH DIFFERENTIAL/PLATELET - Abnormal; Notable for the following components:   WBC 15.8 (*)    RBC 3.81 (*)    Hemoglobin 11.4 (*)    HCT 36.6 (*)    Platelets 494  (*)    Neutro Abs 14.3 (*)    Abs Immature Granulocytes 0.25 (*)    All other components within normal limits  URINALYSIS, ROUTINE W REFLEX MICROSCOPIC - Abnormal; Notable for the following components:   Hgb urine dipstick SMALL (*)    All other components within normal limits  CBC - Abnormal; Notable for the following components:   WBC 11.3 (*)    RBC 3.35 (*)    Hemoglobin 10.0 (*)    HCT 32.2 (*)    Platelets 431 (*)    All other components within normal limits  C-REACTIVE PROTEIN - Abnormal; Notable for the following components:   CRP 5.5 (*)    All other components within normal limits  SEDIMENTATION RATE - Abnormal; Notable for the following components:   Sed Rate 51 (*)    All other components within normal limits  HEMOGLOBIN A1C - Abnormal; Notable for the following components:   Hgb A1c MFr Bld 6.3 (*)    All other components within normal limits  RENAL FUNCTION  PANEL - Abnormal; Notable for the following components:   Glucose, Bld 160 (*)    Calcium 8.6 (*)    Phosphorus 1.8 (*)    Albumin 2.8 (*)    All other components within normal limits    The recent clinical data is shown below. Vitals:   08/26/20 0824 08/26/20 0900 08/26/20 1359 08/26/20 1959  BP:  127/63 (!) 112/52 (!) 121/57  Pulse:  83 83 87  Resp:   16 20  Temp:  97.6 F (36.4 C) 98 F (36.7 C) 97.9 F (36.6 C)  TempSrc:  Oral Oral Oral  SpO2: 96% 100% 94% 100%  Weight:    111.1 kg  Height:    5\' 10"  (1.778 m)     WBC     Component Value Date/Time   WBC 11.3 (H) 08/26/2020 0344   LYMPHSABS 0.7 08/25/2020 1619   MONOABS 0.5 08/25/2020 1619   EOSABS 0.0 08/25/2020 1619   BASOSABS 0.0 08/25/2020 1619         Following Medications were ordered in ER: Medications  acetaminophen (TYLENOL) tablet 650 mg (has no administration in time range)    Or  acetaminophen (TYLENOL) suppository 650 mg (has no administration in time range)  vancomycin (VANCOREADY) IVPB 1500 mg/300 mL (1,500 mg  Intravenous New Bag/Given 08/26/20 1457)  albuterol (PROVENTIL) (2.5 MG/3ML) 0.083% nebulizer solution 2.5 mg (2.5 mg Nebulization Given 08/26/20 0011)  atorvastatin (LIPITOR) tablet 40 mg (40 mg Oral Given 08/26/20 0900)  umeclidinium bromide (INCRUSE ELLIPTA) 62.5 MCG/INH 1 puff (1 puff Inhalation Given 08/26/20 0828)  fluticasone furoate-vilanterol (BREO ELLIPTA) 100-25 MCG/INH 1 puff (1 puff Inhalation Given 08/26/20 0824)  furosemide (LASIX) tablet 20 mg (20 mg Oral Given 08/26/20 0900)  gabapentin (NEURONTIN) capsule 600 mg (600 mg Oral Given 08/26/20 2156)  predniSONE (DELTASONE) tablet 20 mg (20 mg Oral Given 08/26/20 0900)  losartan (COZAAR) tablet 50 mg (50 mg Oral Given 08/26/20 0900)  metroNIDAZOLE (FLAGYL) IVPB 500 mg (500 mg Intravenous New Bag/Given 08/26/20 2159)  ceFEPIme (MAXIPIME) 2 g in sodium chloride 0.9 % 100 mL IVPB (2 g Intravenous New Bag/Given 08/26/20 1904)  potassium PHOSPHATE 30 mmol in dextrose 5 % 500 mL infusion (30 mmol Intravenous New Bag/Given 08/26/20 2208)  piperacillin-tazobactam (ZOSYN) IVPB 3.375 g (0 g Intravenous Stopped 08/25/20 1819)  vancomycin (VANCOCIN) IVPB 1000 mg/200 mL premix (0 mg Intravenous Stopped 08/25/20 1947)  lactated ringers bolus 250 mL (0 mLs Intravenous Stopped 08/25/20 2049)        Consult Orders  (From admission, onward)         Start     Ordered   08/25/20 1828  Consult to hospitalist  Once       Provider:  (Not yet assigned)  Question Answer Comment  Place call to: [facility]   Reason for Consult Admit      08/25/20 1827            OTHER Significant initial  Findings:  labs showing:    Recent Labs  Lab 08/25/20 1619 08/26/20 0344 08/26/20 1454  NA 137  --  141  K 4.4  --  4.0  CO2 27  --  28  GLUCOSE 118*  --  160*  BUN 13  --  11  CREATININE 1.03 0.80 1.02  CALCIUM 8.8*  --  8.6*  PHOS  --   --  1.8*    Cr  stable,   Lab Results  Component Value Date   CREATININE 1.02 08/26/2020  CREATININE 0.80 08/26/2020    CREATININE 1.03 08/25/2020    Recent Labs  Lab 08/25/20 1619 08/26/20 1454  AST 27  --   ALT 21  --   ALKPHOS 64  --   BILITOT 0.7  --   PROT 7.8  --   ALBUMIN 3.4* 2.8*   Lab Results  Component Value Date   CALCIUM 8.6 (L) 08/26/2020   PHOS 1.8 (L) 08/26/2020       Plt: Lab Results  Component Value Date   PLT 431 (H) 08/26/2020      COVID-19 Labs  Recent Labs    08/26/20 1454  CRP 5.5*    Lab Results  Component Value Date   SARSCOV2NAA NEGATIVE 08/25/2020   SARSCOV2NAA NEGATIVE 07/22/2020   [place] NEGATIVE 03/14/2020   SARSCOV2NAA Not Detected 04/18/2019      Recent Labs  Lab 08/25/20 1619 08/26/20 0344  WBC 15.8* 11.3*  NEUTROABS 14.3*  --   HGB 11.4* 10.0*  HCT 36.6* 32.2*  MCV 96.1 96.1  PLT 494* 431*    HG/HCT * stable,  Down *Up from baseline see below    Component Value Date/Time   HGB 10.0 (L) 08/26/2020 0344   HCT 32.2 (L) 08/26/2020 0344   MCV 96.1 08/26/2020 0344       BNP (last 3 results) Recent Labs    07/24/20 0411 07/25/20 0501 07/26/20 0622  BNP 105.0* 123.0* 71.0      DM  labs:  HbA1C: Recent Labs    08/26/20 1454  HGBA1C 6.3*       CBG (last 3)  No results for input(s): GLUCAP in the last 72 hours.        Cultures:    Component Value Date/Time   SDES  08/25/2020 1726    BLOOD RIGHT ANTECUBITAL Performed at [facility], [place] [address] [place], [place] [address]    SDES  08/25/2020 1726    FOOT RIGHT Performed at [facility], [place] [address], [place], [place] [address]    [place]  08/25/2020 1726    BOTTLES DRAWN AEROBIC AND ANAEROBIC Blood Culture adequate volume Performed at [facility] [address], [place], [place] [address]    SPECREQUEST  08/25/2020 1726    NONE Performed at [facility], [place] [address] [place], [place] [address]    CULT  08/25/2020 1726    NO GROWTH < 12 HOURS Performed at [facility] [address], [place], [place] [address]    CULT PENDING 08/25/2020 1726   REPTSTATUS PENDING 08/25/2020 1726   REPTSTATUS PENDING 08/25/2020 1726     Radiological Exams on Admission: DG Foot Complete Right  Result Date: 08/25/2020 CLINICAL DATA:  75 year old male with concern for infection. EXAM: RIGHT FOOT COMPLETE - 3+ VIEW COMPARISON:  None. FINDINGS: There is no acute fracture or dislocation. Faint lucency of the base of the proximal phalanx of the fifth digit and fifth metatarsal head may represent an infectious process/osteomyelitis. MRI or a white blood cell nuclear scan may provide better evaluation. There is diffuse soft tissue swelling primarily involving the dorsum of the foot. Skin ulcer noted along the lateral forefoot adjacent to the fifth MTP joint. No radiopaque foreign object. IMPRESSION: 1. Findings concerning for osteomyelitis at the fifth MTP joint. MRI or a white blood cell nuclear scan may provide better evaluation. 2. Diffuse soft tissue swelling. Electronically Signed   By: Anner Crete M.D.   On:  08/25/2020 17:43   VAS [place] ABI WITH/WO TBI  Result Date: 08/26/2020 LOWER EXTREMITY DOPPLER STUDY Indications: Ulceration, and gangrene. High Risk Factors: Hypertension, Diabetes.  Limitations: Today's exam was limited due to involuntary patient movement. Comparison Study: no prior Performing Technologist: Abram Sander RVS  Examination Guidelines: A complete evaluation includes at minimum, Doppler waveform signals and systolic blood pressure reading at the level of bilateral brachial, anterior tibial, and posterior tibial arteries, when vessel segments are accessible. Bilateral testing is considered an integral part of a complete examination. Photoelectric Plethysmograph (PPG) waveforms and toe systolic pressure readings are included as required and additional duplex testing as needed. Limited examinations for reoccurring indications may be performed as noted.  ABI  Findings: +--------+------------------+-----+----------+--------+ Right   Rt Pressure (mmHg)IndexWaveform  Comment  +--------+------------------+-----+----------+--------+ [id number]                    triphasic          +--------+------------------+-----+----------+--------+ PTA     88                0.73 monophasic         +--------+------------------+-----+----------+--------+ DP      52                0.43 monophasic         +--------+------------------+-----+----------+--------+ +--------+------------------+-----+----------+-------+ Left    Lt Pressure (mmHg)IndexWaveform  Comment +--------+------------------+-----+----------+-------+ [id number]                    triphasic         +--------+------------------+-----+----------+-------+ PTA     74                0.62 monophasic        +--------+------------------+-----+----------+-------+ DP      88                0.73 monophasic        +--------+------------------+-----+----------+-------+ +-------+-----------+-----------+------------+------------+ ABI/TBIToday's ABIToday's TBIPrevious ABIPrevious TBI +-------+-----------+-----------+------------+------------+ Right  0.73                                           +-------+-----------+-----------+------------+------------+ Left   0.73                                           +-------+-----------+-----------+------------+------------+  Summary: Right: Resting right ankle-brachial index indicates moderate right lower extremity arterial disease. Left: Resting left ankle-brachial index indicates moderate left lower extremity arterial disease.  *See table(s) above for measurements and observations.    Preliminary    _______________________________________________________________________________________________________ Latest  Blood pressure (!) 121/57, pulse 87, temperature 97.9 F (36.6 C), temperature source Oral, resp. rate 20, height 5\' 10"   (1.778 m), weight 111.1 kg, SpO2 100 %.   Review of Systems:    Pertinent positives include:   Fatigue foot pain  Constitutional:  No weight loss, night sweats, Fevers, chills,, weight loss  HEENT:  No headaches, Difficulty swallowing,Tooth/dental problems,Sore throat,  No sneezing, itching, ear ache, nasal congestion, post nasal drip,  Cardio-vascular:  No chest pain, Orthopnea, PND, anasarca, dizziness, palpitations.no Bilateral lower extremity swelling  GI:  No heartburn, indigestion, abdominal pain, nausea, vomiting, diarrhea, change in bowel habits, loss of appetite, melena, blood in stool, hematemesis Resp:  no shortness of breath  at rest. No dyspnea on exertion, No excess mucus, no productive cough, No non-productive cough, No coughing up of blood.No change in color of mucus.No wheezing. Skin:  no rash or lesions. No jaundice GU:  no dysuria, change in color of urine, no urgency or frequency. No straining to urinate.  No flank pain.  Musculoskeletal:  No joint pain or no joint swelling. No decreased range of motion. No back pain.  Psych:  No change in mood or affect. No depression or anxiety. No memory loss.  Neuro: no localizing neurological complaints, no tingling, no weakness, no double vision, no gait abnormality, no slurred speech, no confusion  All systems reviewed and apart from [place] all are negative _______________________________________________________________________________________________ Past Medical History:   Past Medical History:  Diagnosis Date  . 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 BPH (benign prostatic hyperplasia)   . Capsular cataract of left eye   . COPD (chronic obstructive pulmonary disease) (HCC)    Dr. [name]  . Current smoker    Long-term smoker. Not interested in quitting  . Diverticulosis    With intermittent diverticulitis  . Erectile dysfunction   . Essential hypertension    Several recorded blood pressures greater  than 140/90.  . H/O mitral valve prolapse    By report, but not confirmed by echo.  . Low back pain    Chronic. Followed by Dr. Suella Broad  . Mild aortic regurgitation 12/2013   Mild to moderate aortic regurgitation on echo  . Neuropathy       Past Surgical History:  Procedure Laterality Date  . COLON SURGERY  2000  . COLONOSCOPY WITH PROPOFOL N/A 03/18/2020   Procedure: COLONOSCOPY WITH PROPOFOL;  Surgeon: Otis Brace, MD;  Location: [facility] ENDOSCOPY;  Service: Gastroenterology;  Laterality: N/A;  . ELBOW SURGERY    . POLYPECTOMY  03/18/2020   Procedure: POLYPECTOMY;  Surgeon: Otis Brace, MD;  Location: [facility] ENDOSCOPY;  Service: Gastroenterology;;  . TRANSTHORACIC ECHOCARDIOGRAM  12/29/2016   EF 60-65%. Moderate LVH. Mild aortic valve calcification. Mild to moderate regurgitation with no stenosis. Mitral valve annular calcification with no comment of prolapse or regurgitation.         reports that he quit smoking about 2 years ago. His smoking use included cigarettes. He has a 100.00 pack-year smoking history. He has never used smokeless tobacco. He reports current alcohol use. He reports that he does not use drugs.     Family History:   Family History  Problem Relation Age of Onset  . Heart disease Father   . Breast cancer Mother   . Other Sister   . Brain cancer Brother    ______________________________________________________________________________________________ Allergies: Allergies  Allergen Reactions  . Other Swelling and Other (See Comments)    Farmed Fish (tightness in throat & lip swelling)  . Codeine Rash  . Sulfa Antibiotics Hives     Prior to Admission medications   Medication Sig Start Date End Date Taking? Authorizing Provider  albuterol (PROVENTIL) (2.5 MG/3ML) 0.083% nebulizer solution USE 1 VIAL IN NEBULIZER EVERY 6 HOURS AS NEEDED FOR WHEEZING AND FOR SHORTNESS OF BREATH Patient taking differently: Take 2.5 mg by nebulization every 6  (six) hours as needed for wheezing or shortness of breath. 01/14/20  Yes Tanda Rockers, MD  aspirin EC 81 MG tablet Take 81 mg by mouth daily. Swallow whole.   Yes [provider]  atorvastatin (LIPITOR) 40 MG tablet TAKE 1 TABLET BY MOUTH ONCE DAILY .  APPOINTMENT REQUIRED FOR FUTURE REFILLS Patient taking differently: Take 40 mg by mouth daily. 07/15/20  Yes Wellington Hampshire, MD  Budeson-Glycopyrrol-Formoterol (BREZTRI AEROSPHERE) 160-9-4.8 MCG/ACT AERO Take 2 puffs first thing in am and then another 2 puffs about 12 hours later. 08/13/20  Yes Tanda Rockers, MD  cyclobenzaprine (FLEXERIL) 10 MG tablet Take 10 mg by mouth at bedtime.   Yes [provider]  furosemide (LASIX) 20 MG tablet Take 1 tablet by mouth once daily 07/28/20  Yes Wellington Hampshire, MD  gabapentin (NEURONTIN) 600 MG tablet Take 600 mg by mouth every 8 (eight) hours. 02/25/20  Yes [provider]  losartan (COZAAR) 50 MG tablet Take 1 tablet (50 mg total) by mouth daily. 01/08/20  Yes Wellington Hampshire, MD  Multiple Vitamin (MULTIVITAMIN WITH MINERALS) TABS tablet Take 1 tablet by mouth daily.   Yes [provider]  oxyCODONE-acetaminophen (PERCOCET) 10-325 MG tablet Take 1 tablet by mouth 2 (two) times daily as needed (back pain.).    Yes [provider]  predniSONE (DELTASONE) 10 MG tablet 2 until better then one daily Patient taking differently: See admin instructions. 2 until better then one daily 08/13/20  Yes Tanda Rockers, MD  PROAIR HFA 108 [phone] Base) MCG/ACT inhaler INHALE 2 PUFFS BY MOUTH EVERY 6 HOURS AS NEEDED FOR WHEEZING FOR SHORTNESS OF BREATH Patient taking differently: Inhale 2 puffs into the lungs every 6 (six) hours as needed for wheezing or shortness of breath. 07/07/20  Yes Tanda Rockers, MD  vitamin B-12 (CYANOCOBALAMIN) 1000 MCG tablet Take 1,000 mcg by mouth daily.   Yes [provider]     ___________________________________________________________________________________________________ Physical Exam: Vitals with BMI 08/26/2020 08/26/2020 08/26/2020  Height 5\' 10"  - -  Weight 244 lbs 15 oz - -  BMI 83.15 - -  Systolic 176 160 737  Diastolic 57 52 63  Pulse 87 83 83     1. General:  in No Acute distress    Chronically ill  -appearing 2. Psychological: Alert and   Oriented 3. Head/ENT:    Dry Mucous Membranes                          Head Non traumatic, neck supple                            Poor Dentition 4. SKIN:   decreased Skin turgor,  Skin foot ulcer 5. Heart: Regular rate and rhythm no Murmur, no Rub or gallop 6. Lungs:    7. Abdomen: Soft, non-tender, Non distended  obese  bowel sounds present 8. Lower extremities: no clubbing, cyanosis, no  edema 9. Neurologically Grossly intact, moving all 4 extremities equally  10. MSK: Normal range of motion    Chart has been reviewed  ______________________________________________________________________________________________  Assessment/Plan   75 y.o. male with medical history significant of diastolic CHF, LBBB, PAD, neuropathy, stasis dermatitis, COPD, chronic hypoxic RF on 4 L and obesity     Admitted forSevere sepsis due to right foot fifth MTP joint osteomyelitis and infected right foot ulcer:   Present on Admission: . Osteomyelitis [facility]) -spoke to Dr. Donnetta Hutching he is aware of patient being at [facility] keep n.p.o. postmidnight vascular surgery will see in the morning . Sepsis ([place]) continue IV antibiotics . COPD III with marked reversibility -continue home meds    Other plan as per orders.    Malay Fantroy  Jaray Boliver 08/27/2020, 12:02 AM    [facility]     after 2 AM please page floor coverage PA If 7AM-7PM, please contact the day team taking care of the patient using [email or web address]   Patient was evaluated in the context of the global COVID-19 pandemic, which necessitated consideration that the patient  might be at risk for infection with the SARS-CoV-2 virus that causes COVID-19. Institutional protocols and algorithms that pertain to the evaluation of patients at risk for COVID-19 are in a state of rapid change based on information released by regulatory bodies including the CDC and federal and state organizations. These policies and algorithms were followed during the patient's care.

## 2020-08-27 ENCOUNTER — Encounter (HOSPITAL_COMMUNITY)
Admission: EM | Disposition: A | Discharge status: Home Health | Payer: Self-pay | Source: Home / Self Care | Attending: Internal Medicine

## 2020-08-27 DIAGNOSIS — I70235 Atherosclerosis of native arteries of right leg with ulceration of other part of foot: Secondary | ICD-10-CM

## 2020-08-27 DIAGNOSIS — J449 Chronic obstructive pulmonary disease, unspecified: Secondary | ICD-10-CM

## 2020-08-27 DIAGNOSIS — I5032 Chronic diastolic (congestive) heart failure: Secondary | ICD-10-CM

## 2020-08-27 HISTORY — PX: ABDOMINAL AORTOGRAM W/LOWER EXTREMITY: CATH118223

## 2020-08-27 LAB — LIPID PANEL
Cholesterol: 129 mg/dL (ref 0–200)
HDL: 37 mg/dL — ABNORMAL LOW (ref >40)
LDL Cholesterol: 74 mg/dL (ref 0–99)
Total CHOL/HDL Ratio: 3.5 RATIO
Triglycerides: 90 mg/dL (ref <150)
VLDL: 18 mg/dL (ref 0–40)

## 2020-08-27 LAB — CREATININE, SERUM
Creatinine, Ser: 0.91 mg/dL (ref 0.61–1.24)
GFR, Estimated: >60 mL/min (ref >60)

## 2020-08-27 LAB — CBC
HCT: 32.6 % — ABNORMAL LOW (ref 39.0–52.0)
HCT: 32.5 % — ABNORMAL LOW (ref 39.0–52.0)
Hemoglobin: 10.3 g/dL — ABNORMAL LOW (ref 13.0–17.0)
Hemoglobin: 10.2 g/dL — ABNORMAL LOW (ref 13.0–17.0)
MCH: 30.3 pg (ref 26.0–34.0)
MCH: 30 pg (ref 26.0–34.0)
MCHC: 31.6 g/dL (ref 30.0–36.0)
MCHC: 31.4 g/dL (ref 30.0–36.0)
MCV: 95.9 fL (ref 80.0–100.0)
MCV: 95.6 fL (ref 80.0–100.0)
Platelets: 404 10*3/uL — ABNORMAL HIGH (ref 150–400)
Platelets: 475 10*3/uL — ABNORMAL HIGH (ref 150–400)
RBC: 3.4 MIL/uL — ABNORMAL LOW (ref 4.22–5.81)
RBC: 3.4 MIL/uL — ABNORMAL LOW (ref 4.22–5.81)
RDW: 14.5 % (ref 11.5–15.5)
RDW: 14.4 % (ref 11.5–15.5)
WBC: 10.8 10*3/uL — ABNORMAL HIGH (ref 4.0–10.5)
WBC: 11.9 10*3/uL — ABNORMAL HIGH (ref 4.0–10.5)
nRBC: 0 % (ref 0.0–0.2)
nRBC: 0 % (ref 0.0–0.2)

## 2020-08-27 LAB — RENAL FUNCTION PANEL
Albumin: 2.5 g/dL — ABNORMAL LOW (ref 3.5–5.0)
Anion gap: 5 (ref 5–15)
BUN: 10 mg/dL (ref 8–23)
CO2: 27 mmol/L (ref 22–32)
Calcium: 8.3 mg/dL — ABNORMAL LOW (ref 8.9–10.3)
Chloride: 106 mmol/L (ref 98–111)
Creatinine, Ser: 0.97 mg/dL (ref 0.61–1.24)
GFR, Estimated: >60 mL/min (ref >60)
Glucose, Bld: 143 mg/dL — ABNORMAL HIGH (ref 70–99)
Phosphorus: 3.9 mg/dL (ref 2.5–4.6)
Potassium: 4.1 mmol/L (ref 3.5–5.1)
Sodium: 138 mmol/L (ref 135–145)

## 2020-08-27 LAB — MAGNESIUM: Magnesium: 2.3 mg/dL (ref 1.7–2.4)

## 2020-08-27 SURGERY — ABDOMINAL AORTOGRAM W/LOWER EXTREMITY
Anesthesia: LOCAL

## 2020-08-27 MED ORDER — FENTANYL CITRATE (PF) 100 MCG/2ML IJ SOLN
INTRAMUSCULAR | Status: DC | PRN
  Administered 2020-08-27: 25 ug via INTRAVENOUS

## 2020-08-27 MED ORDER — FENTANYL CITRATE (PF) 100 MCG/2ML IJ SOLN
INTRAMUSCULAR | Status: AC
  Filled 2020-08-27: qty 2

## 2020-08-27 MED ORDER — HYDRALAZINE HCL 20 MG/ML IJ SOLN: 5.0000 mg | INTRAMUSCULAR | Status: DC | PRN

## 2020-08-27 MED ORDER — MIDAZOLAM HCL 2 MG/2ML IJ SOLN
INTRAMUSCULAR | Status: AC
  Filled 2020-08-27: qty 2

## 2020-08-27 MED ORDER — SODIUM CHLORIDE 0.9 % IV SOLN: INTRAVENOUS | Status: DC

## 2020-08-27 MED ORDER — SODIUM CHLORIDE 0.9 % WEIGHT BASED INFUSION
1.0000 mL/kg/h | INTRAVENOUS | Status: DC
  Administered 2020-08-27: 1 mL/kg/h via INTRAVENOUS

## 2020-08-27 MED ORDER — SODIUM CHLORIDE 0.9% FLUSH
3.0000 mL | Freq: Two times a day (BID) | INTRAVENOUS | Status: DC
  Administered 2020-08-28 – 2020-09-01 (×4): 3 mL via INTRAVENOUS

## 2020-08-27 MED ORDER — ASPIRIN EC 81 MG PO TBEC
81.0000 mg | DELAYED_RELEASE_TABLET | Freq: Every day | ORAL | Status: DC
  Administered 2020-08-27 – 2020-09-03 (×8): 81 mg via ORAL
  Filled 2020-08-27 (×8): qty 1

## 2020-08-27 MED ORDER — HEPARIN (PORCINE) IN NACL 1000-0.9 UT/500ML-% IV SOLN
INTRAVENOUS | Status: DC | PRN
  Administered 2020-08-27: 500 mL

## 2020-08-27 MED ORDER — HEPARIN (PORCINE) IN NACL 1000-0.9 UT/500ML-% IV SOLN
INTRAVENOUS | Status: DC | PRN
Start: 2020-08-27 — End: 2020-08-27
  Administered 2020-08-27: 500 mL

## 2020-08-27 MED ORDER — IODIXANOL 320 MG/ML IV SOLN
INTRAVENOUS | Status: DC | PRN
  Administered 2020-08-27: 100 mL via INTRA_ARTERIAL

## 2020-08-27 MED ORDER — ONDANSETRON HCL 4 MG/2ML IJ SOLN: 4.0000 mg | Freq: Four times a day (QID) | INTRAMUSCULAR | Status: DC | PRN

## 2020-08-27 MED ORDER — HEPARIN (PORCINE) IN NACL 1000-0.9 UT/500ML-% IV SOLN
INTRAVENOUS | Status: AC
  Filled 2020-08-27: qty 1000

## 2020-08-27 MED ORDER — LIDOCAINE HCL (PF) 1 % IJ SOLN
INTRAMUSCULAR | Status: DC | PRN
Start: 2020-08-27 — End: 2020-08-27
  Administered 2020-08-27: 30 mL

## 2020-08-27 MED ORDER — SODIUM CHLORIDE 0.9 % IV SOLN: 250.0000 mL | INTRAVENOUS | Status: DC | PRN

## 2020-08-27 MED ORDER — LABETALOL HCL 5 MG/ML IV SOLN: 10.0000 mg | INTRAVENOUS | Status: DC | PRN

## 2020-08-27 MED ORDER — SODIUM CHLORIDE 0.9% FLUSH: 3.0000 mL | INTRAVENOUS | Status: DC | PRN

## 2020-08-27 MED ORDER — MIDAZOLAM HCL 2 MG/2ML IJ SOLN
INTRAMUSCULAR | Status: DC | PRN
  Administered 2020-08-27: 1 mg via INTRAVENOUS

## 2020-08-27 MED ORDER — HEPARIN SODIUM (PORCINE) 5000 UNIT/ML IJ SOLN
5000.0000 [IU] | Freq: Three times a day (TID) | INTRAMUSCULAR | Status: DC
  Administered 2020-08-27 – 2020-09-03 (×18): 5000 [IU] via SUBCUTANEOUS
  Filled 2020-08-27 (×18): qty 1

## 2020-08-27 MED ORDER — LIDOCAINE HCL (PF) 1 % IJ SOLN
INTRAMUSCULAR | Status: AC
  Filled 2020-08-27: qty 30

## 2020-08-27 SURGICAL SUPPLY — 12 items
CATH OMNI FLUSH 5F 65CM (CATHETERS) ×2 IMPLANT
CATH TEMPO AQUA 5F 100CM (CATHETERS) ×2 IMPLANT
DEVICE CLOSURE MYNXGRIP 5F (Vascular Products) ×2 IMPLANT
KIT MICROPUNCTURE NIT STIFF (SHEATH) ×2 IMPLANT
KIT PV (KITS) ×2 IMPLANT
SHEATH PINNACLE 5F 10CM (SHEATH) ×2 IMPLANT
SHEATH PROBE COVER 6X72 (BAG) ×2 IMPLANT
SYR MEDRAD MARK 7 150ML (SYRINGE) ×2 IMPLANT
TRANSDUCER W/STOPCOCK (MISCELLANEOUS) ×2 IMPLANT
TRAY PV CATH (CUSTOM PROCEDURE TRAY) ×2 IMPLANT
WIRE BENTSON .035X145CM (WIRE) ×2 IMPLANT
WIRE HI TORQ VERSACORE J 260CM (WIRE) ×2 IMPLANT

## 2020-08-27 NOTE — Progress Notes (Signed)
 PROGRESS NOTE  Joel Perez MRN:[id number] DOB: 08/28/1945 DOA: 08/25/2020 PCP: Swayne, David, MD  HPI/Recap of past 24 hours: 75-year-old M with PMH of diastolic CHF, LBBB, PAD, neuropathy, stasis dermatitis, COPD, chronic hypoxic RF on 4 L and obesity presented to ED with worsening right foot wound, and admitted for sepsis due to right fifth MTP joint osteomyelitis and infected right foot ulcer.  He was started on IV vancomycin and Zosyn.  Blood cultures NGTD.  Vascular surgery, Dr. Early consulted and recommended transfer from [place] to [place] for arteriogram and further care.  08/27/20: Patient was seen and examined at his bedside.  He reports pain in his right foot.  Seen by vascular surgery Dr. Early.  Patient will need fifth toe amputation.  Assessment/Plan: Principal Problem:   Osteomyelitis (HCC) Active Problems:   COPD III with marked reversibility   Chronic respiratory failure with hypoxia (HCC)   PVD (peripheral vascular disease) (HCC)   Sepsis (HCC)   Severe sepsis due to right foot fifth MTP joint osteomyelitis and infected right foot ulcer: POA.   Met criteria with leukocytosis, tachycardia and lactic acidosis.   Blood cultures negative to date.   History of peripheral artery disease.   Continue broad-spectrum IV antibiotics, currently on cefepime IV Flagyl and IV vancomycin.  -Vascular surgery following, appreciate assistance. -Plan for right fifth toe amputation.  Peripheral artery disease Continue statin Home aspirin on hold due to planned vascular surgery/right fifth toe amputation. Restart antiplatelet when okay with vascular surgery.  Chronic diastolic CHF  Euvolemic. TTE on 07/22/2020 with LVEF of 60 to 65%, mild LVH and indeterminate DD. -Continue home Lasix 20 mg daily -Monitor fluid and renal status Strict I's and O's and daily weight.  Chronic COPD/chronic hypoxic respiratory failure:  Stable on home 4 L.   Also on prednisone 20 mg  chronically.   Followed by pulmonology. Wean off home prednisone as it may affect wound healing after surgery -Continue home inhalers, albuterol nebulizer as needed.  -Continue supplemental oxygen.  Essential hypertension:  BP is currently at goal. -Continue home Lasix and losartan  Neuropathy -Continue home gabapentin.  Class II obesity Body mass index is 35.01 kg/m. Pressure Injury 07/22/20 Buttocks Left Stage 2 -  Partial thickness loss of dermis presenting as a shallow open injury with a red, pink wound bed without slough. (Active)  07/22/20 1600  Location: Buttocks  Location Orientation: Left  Staging: Stage 2 -  Partial thickness loss of dermis presenting as a shallow open injury with a red, pink wound bed without slough.  Wound Description (Comments):   Present on Admission: Yes     Pressure Injury 07/23/20 Heel Right Stage 3 -  Full thickness tissue loss. Subcutaneous fat may be visible but bone, tendon or muscle are NOT exposed. (Active)  07/23/20 0800  Location: Heel  Location Orientation: Right  Staging: Stage 3 -  Full thickness tissue loss. Subcutaneous fat may be visible but bone, tendon or muscle are NOT exposed.  Wound Description (Comments):   Present on Admission: Yes   DVT prophylaxis:  SCDs Start: 08/25/20 2132  Code Status: Full code Family Communication: None at bedside.    Level of care: Telemetry Medical Status is: Inpatient  Remains inpatient appropriate because:Ongoing diagnostic testing needed not appropriate for outpatient work up, Unsafe d/c plan, IV treatments appropriate due to intensity of illness or inability to take PO and Inpatient level of care appropriate due to severity of illness   Dispo: The   patient is from: Home  Anticipated d/c is to: Home  Patient currently is not medically stable to d/c.              Difficult to place patient not applicable.       Consultants:  Vascular surgery,  Dr. Early          Objective: Vitals:   08/27/20 0424 08/27/20 0740 08/27/20 0840 08/27/20 1243  BP: (!) 150/65 138/66  137/62  Pulse: 72 84  92  Resp: 17 18  18  Temp: 98.1 F (36.7 C) 98.7 F (37.1 C)  98.4 F (36.9 C)  TempSrc: Oral Oral  Oral  SpO2: 100% 97% 97% 98%  Weight:      Height:        Intake/Output Summary (Last 24 hours) at 08/27/2020 1634 Last data filed at 08/27/2020 0631 Gross per 24 hour  Intake 1389.67 ml  Output 1800 ml  Net -410.33 ml   Filed Weights   08/25/20 1535 08/25/20 1541 08/26/20 1959  Weight: 110.7 kg 110.7 kg 111.1 kg    Exam:  . General: 75 y.o. year-old male well developed well nourished in no acute distress.  Alert and oriented x3.  Hard of hearing. . Cardiovascular: Regular rate and rhythm with no rubs or gallops.  No thyromegaly or JVD noted.   . Respiratory: Clear to auscultation with no wheezes or rales. Good inspiratory effort. . Abdomen: Soft nontender nondistended with normal bowel sounds x4 quadrants. . Musculoskeletal: Trace lower extremity edema in lower extremities bilaterally.  Right ulcerative lesion affecting fifth toe. . Skin: Right fifth toe ulcerative lesion. . Psychiatry: Mood is appropriate for condition and setting   Data Reviewed: CBC: Recent Labs  Lab 08/25/20 1619 08/26/20 0344 08/27/20 0059  WBC 15.8* 11.3* 10.8*  NEUTROABS 14.3*  --   --   HGB 11.4* 10.0* 10.3*  HCT 36.6* 32.2* 32.6*  MCV 96.1 96.1 95.9  PLT 494* 431* 404*   Basic Metabolic Panel: Recent Labs  Lab 08/25/20 1619 08/26/20 0344 08/26/20 1454 08/27/20 0059  NA 137  --  141 138  K 4.4  --  4.0 4.1  CL 101  --  106 106  CO2 27  --  28 27  GLUCOSE 118*  --  160* 143*  BUN 13  --  11 10  CREATININE 1.03 0.80 1.02 0.97  CALCIUM 8.8*  --  8.6* 8.3*  MG  --   --   --  2.3  PHOS  --   --  1.8* 3.9   GFR: Estimated Creatinine Clearance: 79.6 mL/min (by C-G formula based on SCr of 0.97 mg/dL). Liver Function Tests: Recent  Labs  Lab 08/25/20 1619 08/26/20 1454 08/27/20 0059  AST 27  --   --   ALT 21  --   --   ALKPHOS 64  --   --   BILITOT 0.7  --   --   PROT 7.8  --   --   ALBUMIN 3.4* 2.8* 2.5*   No results for input(s): LIPASE, AMYLASE in the last 168 hours. No results for input(s): AMMONIA in the last 168 hours. Coagulation Profile: No results for input(s): INR, PROTIME in the last 168 hours. Cardiac Enzymes: No results for input(s): CKTOTAL, CKMB, CKMBINDEX, TROPONINI in the last 168 hours. BNP (last 3 results) No results for input(s): PROBNP in the last 8760 hours. HbA1C: Recent Labs    08/26/20 1454  HGBA1C 6.3*   CBG: No results for   input(s): GLUCAP in the last 168 hours. Lipid Profile: Recent Labs    08/27/20 0059  CHOL 129  HDL 37*  LDLCALC 74  TRIG 90  CHOLHDL 3.5   Thyroid Function Tests: No results for input(s): TSH, T4TOTAL, FREET4, T3FREE, THYROIDAB in the last 72 hours. Anemia Panel: No results for input(s): VITAMINB12, FOLATE, FERRITIN, TIBC, IRON, RETICCTPCT in the last 72 hours. Urine analysis:    Component Value Date/Time   COLORURINE YELLOW 08/25/2020 1620   APPEARANCEUR CLEAR 08/25/2020 1620   LABSPEC 1.008 08/25/2020 1620   PHURINE 8.0 08/25/2020 1620   GLUCOSEU NEGATIVE 08/25/2020 1620   HGBUR SMALL (A) 08/25/2020 1620   BILIRUBINUR NEGATIVE 08/25/2020 1620   KETONESUR NEGATIVE 08/25/2020 1620   PROTEINUR NEGATIVE 08/25/2020 1620   NITRITE NEGATIVE 08/25/2020 1620   LEUKOCYTESUR NEGATIVE 08/25/2020 1620   Sepsis Labs: _0 (procalcitonin:4,lacticidven:4)  ) Recent Results (from the past 240 hour(s))  Blood culture (routine x 2)     Status: None (Preliminary result)   Collection Time: 08/25/20  5:15 PM   Specimen: BLOOD  Result Value Ref Range Status   Specimen Description   Final    BLOOD LEFT ANTECUBITAL Performed at [facility], [place] [address] [place], [place] [address]    Special Requests   Final    BOTTLES DRAWN  AEROBIC ONLY Blood Culture results may not be optimal due to an inadequate volume of blood received in culture bottles Performed at [facility] [address] [place], [place] [address]    Culture   Final    NO GROWTH 2 DAYS Performed at [facility] [address], [place], [place] [address]    Report Status PENDING  Incomplete  Blood culture (routine x 2)     Status: None (Preliminary result)   Collection Time: 08/25/20  5:26 PM   Specimen: BLOOD  Result Value Ref Range Status   Specimen Description   Final    BLOOD RIGHT ANTECUBITAL Performed at [facility] [address], [place], [place] [address]    Special Requests   Final    BOTTLES DRAWN AEROBIC AND ANAEROBIC Blood Culture adequate volume Performed at [facility] [address] [place], [place] [address]    Culture   Final    NO GROWTH 2 DAYS Performed at [facility] [address], [place], [place] [address]    Report Status PENDING  Incomplete  Aerobic Culture w Gram Stain (superficial specimen)     Status: None (Preliminary result)   Collection Time: 08/25/20  5:26 PM   Specimen: Foot  Result Value Ref Range Status   Specimen Description   Final    FOOT RIGHT Performed at [facility] [address] [place], [place] [address]    Special Requests   Final    NONE Performed at [facility], [place] [address] [place], [place] [address]    Gram Stain   Final    FEW WBC PRESENT, PREDOMINANTLY PMN ABUNDANT GRAM POSITIVE COCCI IN PAIRS IN CLUSTERS ABUNDANT GRAM NEGATIVE RODS FEW GRAM POSITIVE RODS    Culture   Final    ABUNDANT GROUP B STREP(S.AGALACTIAE)ISOLATED TESTING AGAINST S. AGALACTIAE NOT ROUTINELY PERFORMED DUE TO PREDICTABILITY OF AMP/PEN/VAN SUSCEPTIBILITY. FEW STAPHYLOCOCCUS AUREUS CULTURE REINCUBATED FOR BETTER GROWTH Performed at [facility], [place] [address], [place], [place]  [address]    Report Status PENDING  Incomplete  Resp Panel by RT-PCR (  Flu A&B, Covid) Nasopharyngeal Swab     Status: None   Collection Time: 08/25/20  6:43 PM   Specimen: Nasopharyngeal Swab; Nasopharyngeal(NP) swabs in vial transport medium  Result Value Ref Range Status   SARS Coronavirus 2 by RT PCR NEGATIVE NEGATIVE Final    Comment: (NOTE) SARS-CoV-2 target nucleic acids are NOT DETECTED.  The SARS-CoV-2 RNA is generally detectable in upper respiratory specimens during the acute phase of infection. The lowest concentration of SARS-CoV-2 viral copies this assay can detect is 138 copies/mL. A negative result does not preclude SARS-Cov-2 infection and should not be used as the sole basis for treatment or other patient management decisions. A negative result may occur with  improper specimen collection/handling, submission of specimen other than nasopharyngeal swab, presence of viral mutation(s) within the areas targeted by this assay, and inadequate number of viral copies(<138 copies/mL). A negative result must be combined with clinical observations, patient history, and epidemiological information. The expected result is Negative.  Fact Sheet for Patients:  https://www.fda.gov/media/152166/download  Fact Sheet for Healthcare Providers:  https://www.fda.gov/media/152162/download  This test is no t yet approved or cleared by the [place] FDA and  has been authorized for detection and/or diagnosis of SARS-CoV-2 by FDA under an Emergency Use Authorization (EUA). This EUA will remain  in effect (meaning this test can be used) for the duration of the COVID-19 declaration under Section 564(b)(1) of the Act, 21 U.S.C.section 360bbb-3(b)(1), unless the authorization is terminated  or revoked sooner.       Influenza A by PCR NEGATIVE NEGATIVE Final   Influenza B by PCR NEGATIVE NEGATIVE Final    Comment: (NOTE) The Xpert Xpress SARS-CoV-2/FLU/RSV plus assay is intended as an  aid in the diagnosis of influenza from Nasopharyngeal swab specimens and should not be used as a sole basis for treatment. Nasal washings and aspirates are unacceptable for Xpert Xpress SARS-CoV-2/FLU/RSV testing.  Fact Sheet for Patients: https://www.fda.gov/media/152166/download  Fact Sheet for Healthcare Providers: https://www.fda.gov/media/152162/download  This test is not yet approved or cleared by the [place] FDA and has been authorized for detection and/or diagnosis of SARS-CoV-2 by FDA under an Emergency Use Authorization (EUA). This EUA will remain in effect (meaning this test can be used) for the duration of the COVID-19 declaration under Section 564(b)(1) of the Act, 21 U.S.C. section 360bbb-3(b)(1), unless the authorization is terminated or revoked.  Performed at [facility], [address] [place], [place] [address]       Studies: No results found.  Scheduled Meds: . atorvastatin  40 mg Oral Daily  . fluticasone furoate-vilanterol  1 puff Inhalation Daily  . furosemide  20 mg Oral Daily  . gabapentin  600 mg Oral Q8H  . losartan  50 mg Oral Daily  . predniSONE  20 mg Oral Q breakfast  . umeclidinium bromide  1 puff Inhalation Daily    Continuous Infusions: . sodium chloride 100 mL/hr at 08/27/20 1304  . ceFEPime (MAXIPIME) IV 2 g (08/27/20 0925)  . metronidazole 500 mg (08/27/20 1431)  . vancomycin 1,500 mg (08/27/20 1251)     LOS: 2 days      N , MD [facility] Pager [phone]  If 7PM-7AM, please contact night-coverage [email or web address] Password [email or web address] 08/27/2020, 4:34 PM   

## 2020-08-27 NOTE — Op Note (Signed)
DATE OF SERVICE: 08/27/2020  PATIENT:  Joel Perez  75 y.o. male  PRE-OPERATIVE DIAGNOSIS:  Atherosclerosis of native arteries of right lower extremity causing osteomyelitis  POST-OPERATIVE DIAGNOSIS:  Same  PROCEDURE:   1) US guided left common femoral access 2) Aortogram 3) Right lower extremity angiogram with third order cannulation (177mL total contrast) 4) Conscious sedation (27 minutes)  SURGEON:  Surgeon(s) and Role:    * Cherre Robins, MD - Primary  ASSISTANT: none  ANESTHESIA:   local and IV sedation  EBL: min  BLOOD ADMINISTERED:none  DRAINS: none   LOCAL MEDICATIONS USED:  LIDOCAINE   SPECIMEN:  none  COUNTS: confirmed correct.  TOURNIQUET:  None  PATIENT DISPOSITION:  PACU - hemodynamically stable.   Delay start of Pharmacological VTE agent (>24hrs) due to surgical blood loss or risk of bleeding: no  INDICATION FOR PROCEDURE: Joel Perez is a 75 y.o. male with right fifth metatarsal osteomyelitis and non-invasive vascular lab evidence of peripheral arterial disease. After careful discussion of risks, benefits, and alternatives the patient was offered angiography with intervention. The patient understood and wished to proceed.  OPERATIVE FINDINGS:  No flow limiting disease in aortoiliac arteries Ectasia of bilateral common iliac arteries  Right lower extremity: Mild atherosclerosis, but no flow limiting disease in R CFA / R PFA / R SFA Mild atherosclerosis, but no flow limiting disease in R popliteal artery Mild atherosclerosis, but no flow limiting disease inTibial trifurcation  Moderate disease throughout the tibial vessels, but all three vessels course to the foot. Brisk flow into the pedal arch and pedal vessels. One area of focal atherosclerotic change in Hunter's canal was interrogated with direct measurement of pressure below and above. No difference in pressure noted.  DESCRIPTION OF PROCEDURE: After identification of the patient in the  pre-operative holding area, the patient was transferred to the operating room. The patient was positioned supine on the operating room table. Anesthesia was induced. The groins was prepped and draped in standard fashion. A surgical pause was performed confirming correct patient, procedure, and operative location.  The left groin was anesthetized with subcutaneous injection of 1% lidocaine. Using ultrasound guidance, the left common femoral artery was accessed with micropuncture technique. Fluoroscopy was used to confirm cannulation over the femoral head. Sheathogram was not performed. The 51F sheath was upsized to 59F.   An 035 glidewire advantage was advanced into the distal aorta. Over the wire an omni flush catheter was advanced to the level of L2. Aortogram was performed - see above for details.   The right common iliac artery was selected with the 035 glidewire advantage. The wire was advanced into the common femoral artery. Over the wire the omni flush catheter was advanced into the external iliac artery. Selective angiography was performed - see above for details.   A mynx device was used to close the arteriotomy. Hemostasis was excellent upon completion.  Conscious sedation was administered with the use of IV fentanyl and midazolam under continuous physician and nurse monitoring.  Heart rate, blood pressure, and oxygen saturation were continuously monitored.  Total sedation time was 27 minutes  Upon completion of the case instrument and sharps counts were confirmed correct. The patient was transferred to the PACU in good condition. I was present for all portions of the procedure.  PLAN: no evidence of hemodynamically significant arterial disease requiring revascularization. Needs best medical therapy for PAD: ASA / Statin indefinitely.   [name]. Stanford Breed, MD [facility]  Office Phone Number: [phone] 08/27/2020 5:24 PM

## 2020-08-27 NOTE — Consult Note (Addendum)
Hospital Consult    Reason for Consult: Right foot wound Requesting Physician:  [facility] MRN #:  [id number]  History of Present Illness: This is a 75 y.o. male with past medical history significant for diastolic CHF, neuropathy, COPD and chronic hypoxic respiratory failure on 4 L.  He states that he has had this right foot ulceration for the past month.  He has had home health RN performing wound care however told him to go to the emergency department at [facility] due to worsening appearance of wound.  He denies history of claudication and rest pain.  He has no history of diabetes however hemoglobin A1c is 6.3.  He is a former smoker.  He has been started on vancomycin and Zosyn.  Work-up has also included ABIs which demonstrate 0.7 bilaterally with monophasic waveforms at the ankle as well as xr R foot concerning for osteomyelitis.  Past Medical History:  Diagnosis Date  . 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 BPH (benign prostatic hyperplasia)   . Capsular cataract of left eye   . COPD (chronic obstructive pulmonary disease) (HCC)    Dr. [name]  . Current smoker    Long-term smoker. Not interested in quitting  . Diverticulosis    With intermittent diverticulitis  . Erectile dysfunction   . Essential hypertension    Several recorded blood pressures greater than 140/90.  . H/O mitral valve prolapse    By report, but not confirmed by echo.  . Low back pain    Chronic. Followed by Dr. Suella Broad  . Mild aortic regurgitation 12/2013   Mild to moderate aortic regurgitation on echo  . Neuropathy     Past Surgical History:  Procedure Laterality Date  . COLON SURGERY  2000  . COLONOSCOPY WITH PROPOFOL N/A 03/18/2020   Procedure: COLONOSCOPY WITH PROPOFOL;  Surgeon: Otis Brace, MD;  Location: [facility] ENDOSCOPY;  Service: Gastroenterology;  Laterality: N/A;  . ELBOW SURGERY    . POLYPECTOMY  03/18/2020   Procedure: POLYPECTOMY;  Surgeon: Otis Brace, MD;  Location:  [facility] ENDOSCOPY;  Service: Gastroenterology;;  . TRANSTHORACIC ECHOCARDIOGRAM  12/29/2016   EF 60-65%. Moderate LVH. Mild aortic valve calcification. Mild to moderate regurgitation with no stenosis. Mitral valve annular calcification with no comment of prolapse or regurgitation.    Allergies  Allergen Reactions  . Other Swelling and Other (See Comments)    Farmed Fish (tightness in throat & lip swelling)  . Codeine Rash  . Sulfa Antibiotics Hives    Prior to Admission medications   Medication Sig Start Date End Date Taking? Authorizing Provider  albuterol (PROVENTIL) (2.5 MG/3ML) 0.083% nebulizer solution USE 1 VIAL IN NEBULIZER EVERY 6 HOURS AS NEEDED FOR WHEEZING AND FOR SHORTNESS OF BREATH Patient taking differently: Take 2.5 mg by nebulization every 6 (six) hours as needed for wheezing or shortness of breath. 01/14/20  Yes Tanda Rockers, MD  aspirin EC 81 MG tablet Take 81 mg by mouth daily. Swallow whole.   Yes [provider]  atorvastatin (LIPITOR) 40 MG tablet TAKE 1 TABLET BY MOUTH ONCE DAILY . APPOINTMENT REQUIRED FOR FUTURE REFILLS Patient taking differently: Take 40 mg by mouth daily. 07/15/20  Yes Wellington Hampshire, MD  Budeson-Glycopyrrol-Formoterol (BREZTRI AEROSPHERE) 160-9-4.8 MCG/ACT AERO Take 2 puffs first thing in am and then another 2 puffs about 12 hours later. 08/13/20  Yes Tanda Rockers, MD  cyclobenzaprine (FLEXERIL) 10 MG tablet Take 10 mg by mouth at bedtime.  Yes [provider]  furosemide (LASIX) 20 MG tablet Take 1 tablet by mouth once daily 07/28/20  Yes Wellington Hampshire, MD  gabapentin (NEURONTIN) 600 MG tablet Take 600 mg by mouth every 8 (eight) hours. 02/25/20  Yes [provider]  losartan (COZAAR) 50 MG tablet Take 1 tablet (50 mg total) by mouth daily. 01/08/20  Yes Wellington Hampshire, MD  Multiple Vitamin (MULTIVITAMIN WITH MINERALS) TABS tablet Take 1 tablet by mouth daily.   Yes [provider]  oxyCODONE-acetaminophen  (PERCOCET) 10-325 MG tablet Take 1 tablet by mouth 2 (two) times daily as needed (back pain.).    Yes [provider]  predniSONE (DELTASONE) 10 MG tablet 2 until better then one daily Patient taking differently: See admin instructions. 2 until better then one daily 08/13/20  Yes Tanda Rockers, MD  PROAIR HFA 108 [phone] Base) MCG/ACT inhaler INHALE 2 PUFFS BY MOUTH EVERY 6 HOURS AS NEEDED FOR WHEEZING FOR SHORTNESS OF BREATH Patient taking differently: Inhale 2 puffs into the lungs every 6 (six) hours as needed for wheezing or shortness of breath. 07/07/20  Yes Tanda Rockers, MD  vitamin B-12 (CYANOCOBALAMIN) 1000 MCG tablet Take 1,000 mcg by mouth daily.   Yes [provider]    Social History   Socioeconomic History  . Marital status: Married    Spouse name: Not on file  . Number of children: Not on file  . Years of education: Not on file  . Highest education level: Not on file  Occupational History  . Occupation: [employer]   Tobacco Use  . Smoking status: Former Smoker    Packs/day: 2.00    Years: 50.00    Pack years: 100.00    Types: Cigarettes    Quit date: 03/16/2018    Years since quitting: 2.4  . Smokeless tobacco: Never Used  Vaping Use  . Vaping Use: Some days  . Start date: 03/16/2018  Substance and Sexual Activity  . Alcohol use: Yes    Alcohol/week: 0.0 standard drinks  . Drug use: No  . Sexual activity: Not on file  Other Topics Concern  . Not on file  Social History Narrative   Retired Animal nutritionist.   He lives with wife.    Highest level of education:  Trade school   Social Determinants of Health   Financial Resource Strain: Not on file  Food Insecurity: Not on file  Transportation Needs: Not on file  Physical Activity: Not on file  Stress: Not on file  Social Connections: Not on file  Intimate Partner Violence: Not on file     Family History  Problem Relation Age of Onset  . Heart disease Father   . Breast cancer  Mother   . Other Sister   . Brain cancer Brother     ROS: Otherwise negative unless mentioned in HPI  Physical Examination  Vitals:   08/26/20 2310 08/27/20 0424  BP: 130/60 (!) 150/65  Pulse: 78 72  Resp: 18 17  Temp: (!) 97.5 F (36.4 C) 98.1 F (36.7 C)  SpO2: 97% 100%   Body mass index is 35.14 kg/m.  General:  WDWN in NAD Gait: Not observed HENT: WNL, normocephalic Pulmonary: normal non-labored breathing Cardiac: regular Abdomen: soft, NT/ND, no masses Skin: without rashes Vascular Exam/Pulses: brisk L DP and PT signal by doppler; R DP and PT signal by doppler; exam limited due to body habitus but palpable femoral pulses bilaterally Extremities: multiple wounds of R  dorsal and plantar foot; large ulceration plantar foot at base of 5th toe; pitting edema to the level of the knee Musculoskeletal: no muscle wasting or atrophy  Neurologic: A&O X 3;  No focal weakness or paresthesias are detected; speech is fluent/normal Psychiatric:  The pt has Normal affect. Lymph:  Unremarkable  CBC    Component Value Date/Time   WBC 10.8 (H) 08/27/2020 0059   RBC 3.40 (L) 08/27/2020 0059   HGB 10.3 (L) 08/27/2020 0059   HCT 32.6 (L) 08/27/2020 0059   PLT 404 (H) 08/27/2020 0059   MCV 95.9 08/27/2020 0059   MCH 30.3 08/27/2020 0059   MCHC 31.6 08/27/2020 0059   RDW 14.5 08/27/2020 0059   LYMPHSABS 0.7 08/25/2020 1619   MONOABS 0.5 08/25/2020 1619   EOSABS 0.0 08/25/2020 1619   BASOSABS 0.0 08/25/2020 1619    BMET    Component Value Date/Time   NA 138 08/27/2020 0059   NA 140 01/04/2019 1451   K 4.1 08/27/2020 0059   CL 106 08/27/2020 0059   CO2 27 08/27/2020 0059   GLUCOSE 143 (H) 08/27/2020 0059   BUN 10 08/27/2020 0059   BUN 12 01/04/2019 1451   CREATININE 0.97 08/27/2020 0059   CALCIUM 8.3 (L) 08/27/2020 0059   GFRNONAA >60 08/27/2020 0059   GFRAA 91 01/04/2019 1451    COAGS: No results found for: INR, PROTIME   Non-Invasive Vascular Imaging:   ABI 0.7  with monophasic waveform bilaterally R foot XR concerning for osteomyelitis   ASSESSMENT/PLAN: This is a 75 y.o. male with osteomyelitis right foot  -ABIs demonstrate monophasic flow to the level of the foot -Plan will be for aortogram with right lower extremity runoff in an attempt to improve circulation.  Patient is n.p.o. today.  Schedule permitting, we will try to perform this procedure today however patient is aware this may need to be performed tomorrow.  The nature of the procedure was discussed with the patient and all questions were answered.  He agrees to proceed. -Agree with broad-spectrum IV antibiotics -On-call vascular surgeon Dr. Donnetta Hutching will evaluate the patient later today and provide further treatment plans   Dagoberto Ligas PA-C [facility] [phone]  I have examined the patient, reviewed and agree with above.  Poor historian.  Apparently had extensive swelling in both lower extremities when he was admitted with pneumonia to [facility] as though he had sequential wraps for the swelling.  He was noted to have an ulceration on the plantar aspect of his right fifth toe thinks that this has been present for over 1 month.  By physical exam this does appear to extend down into his fifth metatarsal head.  X-ray shows osteomyelitis.  Will require arteriography to determine revascularization options.  Discussed this with the patient.  May be a candidate for endovascular treatment and may require open bypass.  Explained that he will need fifth toe amputation.  We will try to get on the angio schedule today.  We will keep him n.p.o.  If this is not possible will feed him and plan for tomorrow.  Curt Jews, MD 08/27/2020 8:29 AM

## 2020-08-27 NOTE — Progress Notes (Signed)
Pt returned from cath lab at 1740.  Vital signs recorded.  CCMD notified.  L groin intact / no hematoma.

## 2020-08-28 ENCOUNTER — Inpatient Hospital Stay (HOSPITAL_COMMUNITY): Payer: Medicare Other | Admitting: Anesthesiology

## 2020-08-28 ENCOUNTER — Encounter (HOSPITAL_COMMUNITY): Payer: Self-pay | Admitting: Vascular Surgery

## 2020-08-28 ENCOUNTER — Encounter (HOSPITAL_COMMUNITY)
Admission: EM | Disposition: A | Discharge status: Home Health | Payer: Self-pay | Source: Home / Self Care | Attending: Internal Medicine

## 2020-08-28 HISTORY — PX: AMPUTATION: SHX166

## 2020-08-28 HISTORY — PX: APPLICATION OF WOUND VAC: SHX5189

## 2020-08-28 LAB — CBC WITH DIFFERENTIAL/PLATELET
Abs Immature Granulocytes: 0.17 10*3/uL — ABNORMAL HIGH (ref 0.00–0.07)
Basophils Absolute: 0 10*3/uL (ref 0.0–0.1)
Basophils Relative: 0 %
Eosinophils Absolute: 0.1 10*3/uL (ref 0.0–0.5)
Eosinophils Relative: 1 %
HCT: 34.6 % — ABNORMAL LOW (ref 39.0–52.0)
Hemoglobin: 10.7 g/dL — ABNORMAL LOW (ref 13.0–17.0)
Immature Granulocytes: 1 %
Lymphocytes Relative: 17 %
Lymphs Abs: 2 10*3/uL (ref 0.7–4.0)
MCH: 29.5 pg (ref 26.0–34.0)
MCHC: 30.9 g/dL (ref 30.0–36.0)
MCV: 95.3 fL (ref 80.0–100.0)
Monocytes Absolute: 0.9 10*3/uL (ref 0.1–1.0)
Monocytes Relative: 8 %
Neutro Abs: 8.7 10*3/uL — ABNORMAL HIGH (ref 1.7–7.7)
Neutrophils Relative %: 73 %
Platelets: 444 10*3/uL — ABNORMAL HIGH (ref 150–400)
RBC: 3.63 MIL/uL — ABNORMAL LOW (ref 4.22–5.81)
RDW: 14.4 % (ref 11.5–15.5)
WBC: 12 10*3/uL — ABNORMAL HIGH (ref 4.0–10.5)
nRBC: 0 % (ref 0.0–0.2)

## 2020-08-28 LAB — BLOOD CULTURE ID PANEL (REFLEXED) - BCID2

## 2020-08-28 LAB — BASIC METABOLIC PANEL
Anion gap: 6 (ref 5–15)
BUN: 9 mg/dL (ref 8–23)
CO2: 26 mmol/L (ref 22–32)
Calcium: 8.3 mg/dL — ABNORMAL LOW (ref 8.9–10.3)
Chloride: 104 mmol/L (ref 98–111)
Creatinine, Ser: 0.79 mg/dL (ref 0.61–1.24)
GFR, Estimated: >60 mL/min (ref >60)
Glucose, Bld: 76 mg/dL (ref 70–99)
Potassium: 3.8 mmol/L (ref 3.5–5.1)
Sodium: 136 mmol/L (ref 135–145)

## 2020-08-28 LAB — SURGICAL PCR SCREEN
MRSA, PCR: NEGATIVE
Staphylococcus aureus: POSITIVE — AB

## 2020-08-28 SURGERY — AMPUTATION, FOOT, RAY
Anesthesia: General | Site: Foot | Laterality: Right

## 2020-08-28 MED ORDER — PROPOFOL 10 MG/ML IV BOLUS
INTRAVENOUS | Status: DC | PRN
  Administered 2020-08-28: 50 mg via INTRAVENOUS
  Administered 2020-08-28: 20 mg via INTRAVENOUS
  Administered 2020-08-28: 130 mg via INTRAVENOUS

## 2020-08-28 MED ORDER — LIDOCAINE 2% (20 MG/ML) 5 ML SYRINGE
INTRAMUSCULAR | Status: DC | PRN
  Administered 2020-08-28: 100 mg via INTRAVENOUS

## 2020-08-28 MED ORDER — LACTATED RINGERS IV SOLN: INTRAVENOUS | Status: DC

## 2020-08-28 MED ORDER — DEXAMETHASONE SODIUM PHOSPHATE 10 MG/ML IJ SOLN
INTRAMUSCULAR | Status: DC | PRN
  Administered 2020-08-28: 5 mg via INTRAVENOUS

## 2020-08-28 MED ORDER — TRAMADOL HCL 50 MG PO TABS
ORAL_TABLET | ORAL | Status: AC
  Filled 2020-08-28: qty 1

## 2020-08-28 MED ORDER — FENTANYL CITRATE (PF) 250 MCG/5ML IJ SOLN
INTRAMUSCULAR | Status: DC | PRN
  Administered 2020-08-28: 25 ug via INTRAVENOUS
  Administered 2020-08-28: 50 ug via INTRAVENOUS

## 2020-08-28 MED ORDER — PHENYLEPHRINE 40 MCG/ML (10ML) SYRINGE FOR IV PUSH (FOR BLOOD PRESSURE SUPPORT)
PREFILLED_SYRINGE | INTRAVENOUS | Status: DC | PRN
  Administered 2020-08-28: 120 ug via INTRAVENOUS
  Administered 2020-08-28 (×2): 80 ug via INTRAVENOUS
  Administered 2020-08-28: 120 ug via INTRAVENOUS

## 2020-08-28 MED ORDER — FENTANYL CITRATE (PF) 100 MCG/2ML IJ SOLN
25.0000 ug | INTRAMUSCULAR | Status: DC | PRN
  Administered 2020-08-28 (×2): 50 ug via INTRAVENOUS

## 2020-08-28 MED ORDER — FENTANYL CITRATE (PF) 100 MCG/2ML IJ SOLN
INTRAMUSCULAR | Status: AC
  Filled 2020-08-28: qty 2

## 2020-08-28 MED ORDER — ONDANSETRON HCL 4 MG/2ML IJ SOLN
INTRAMUSCULAR | Status: DC | PRN
  Administered 2020-08-28: 4 mg via INTRAVENOUS

## 2020-08-28 MED ORDER — CHLORHEXIDINE GLUCONATE 0.12 % MT SOLN
15.0000 mL | OROMUCOSAL | Status: AC
  Filled 2020-08-28: qty 15

## 2020-08-28 MED ORDER — CHLORHEXIDINE GLUCONATE 0.12 % MT SOLN
OROMUCOSAL | Status: AC
  Administered 2020-08-28: 15 mL via OROMUCOSAL
  Filled 2020-08-28: qty 15

## 2020-08-28 MED ORDER — 0.9 % SODIUM CHLORIDE (POUR BTL) OPTIME
TOPICAL | Status: DC | PRN
  Administered 2020-08-28: 1000 mL

## 2020-08-28 MED ORDER — ALBUTEROL SULFATE HFA 108 (90 BASE) MCG/ACT IN AERS
INHALATION_SPRAY | RESPIRATORY_TRACT | Status: DC | PRN
  Administered 2020-08-28: 2 via RESPIRATORY_TRACT

## 2020-08-28 MED ORDER — MUPIROCIN 2 % EX OINT
1.0000 "application " | TOPICAL_OINTMENT | Freq: Two times a day (BID) | CUTANEOUS | Status: AC
  Administered 2020-08-28 – 2020-09-01 (×9): 1 via NASAL
  Filled 2020-08-28 (×3): qty 22

## 2020-08-28 MED ORDER — ONDANSETRON HCL 4 MG/2ML IJ SOLN: 4.0000 mg | Freq: Once | INTRAMUSCULAR | Status: DC | PRN

## 2020-08-28 MED ORDER — TRAMADOL HCL 50 MG PO TABS
50.0000 mg | ORAL_TABLET | Freq: Four times a day (QID) | ORAL | Status: DC | PRN
  Administered 2020-08-28 – 2020-08-29 (×3): 50 mg via ORAL
  Filled 2020-08-28 (×2): qty 1

## 2020-08-28 MED ORDER — FENTANYL CITRATE (PF) 250 MCG/5ML IJ SOLN
INTRAMUSCULAR | Status: AC
  Filled 2020-08-28: qty 5

## 2020-08-28 MED ORDER — LIDOCAINE-EPINEPHRINE 1 %-1:100000 IJ SOLN
INTRAMUSCULAR | Status: AC
  Filled 2020-08-28: qty 1

## 2020-08-28 MED ORDER — MORPHINE SULFATE (PF) 2 MG/ML IV SOLN
1.0000 mg | INTRAVENOUS | Status: DC | PRN
  Administered 2020-08-28 – 2020-08-29 (×3): 1 mg via INTRAVENOUS
  Filled 2020-08-28 (×4): qty 1

## 2020-08-28 MED ORDER — PROPOFOL 10 MG/ML IV BOLUS
INTRAVENOUS | Status: AC
  Filled 2020-08-28: qty 20

## 2020-08-28 MED ORDER — ACETAMINOPHEN 500 MG PO TABS
1000.0000 mg | ORAL_TABLET | Freq: Once | ORAL | Status: AC
  Administered 2020-08-28: 1000 mg via ORAL
  Filled 2020-08-28: qty 2

## 2020-08-28 MED ORDER — LIDOCAINE HCL (PF) 1 % IJ SOLN
INTRAMUSCULAR | Status: AC
  Filled 2020-08-28: qty 30

## 2020-08-28 SURGICAL SUPPLY — 30 items
BLADE SAW SGTL NAR THIN XSHT (BLADE) IMPLANT
BLADE SURG 21 STRL SS (BLADE) ×3 IMPLANT
BNDG CONFORM 3 STRL LF (GAUZE/BANDAGES/DRESSINGS) ×3 IMPLANT
BNDG ELASTIC 4X5.8 VLCR STR LF (GAUZE/BANDAGES/DRESSINGS) ×3 IMPLANT
BNDG GAUZE ELAST 4 BULKY (GAUZE/BANDAGES/DRESSINGS) ×3 IMPLANT
CANISTER SUCT 3000ML PPV (MISCELLANEOUS) ×3 IMPLANT
COVER SURGICAL LIGHT HANDLE (MISCELLANEOUS) ×3 IMPLANT
COVER WAND RF STERILE (DRAPES) ×3 IMPLANT
DRAPE HALF SHEET 40X57 (DRAPES) ×3 IMPLANT
DRSG ADAPTIC 3X8 NADH LF (GAUZE/BANDAGES/DRESSINGS) ×3 IMPLANT
DRSG VAC ATS SM SENSATRAC (GAUZE/BANDAGES/DRESSINGS) ×3 IMPLANT
ELECT REM PT RETURN 9FT ADLT (ELECTROSURGICAL) ×3
ELECTRODE REM PT RTRN 9FT ADLT (ELECTROSURGICAL) ×1 IMPLANT
GAUZE SPONGE 4X4 12PLY STRL (GAUZE/BANDAGES/DRESSINGS) ×3 IMPLANT
GLOVE SURG SS PI 8.0 STRL IVOR (GLOVE) ×3 IMPLANT
GOWN STRL REUS W/ TWL LRG LVL3 (GOWN DISPOSABLE) ×3 IMPLANT
GOWN STRL REUS W/TWL LRG LVL3 (GOWN DISPOSABLE) ×9
KIT BASIN OR (CUSTOM PROCEDURE TRAY) ×3 IMPLANT
KIT TURNOVER KIT B (KITS) ×3 IMPLANT
NS IRRIG 1000ML POUR BTL (IV SOLUTION) ×3 IMPLANT
PACK GENERAL/GYN (CUSTOM PROCEDURE TRAY) ×3 IMPLANT
PAD ABD 8X10 STRL (GAUZE/BANDAGES/DRESSINGS) ×3 IMPLANT
PAD ARMBOARD 7.5X6 YLW CONV (MISCELLANEOUS) ×6 IMPLANT
SPECIMEN JAR SMALL (MISCELLANEOUS) ×3 IMPLANT
SUT ETHILON 2 0 PSLX (SUTURE) ×6 IMPLANT
SUT ETHILON 3 0 PS 1 (SUTURE) ×3 IMPLANT
TOWEL GREEN STERILE (TOWEL DISPOSABLE) ×6 IMPLANT
TOWEL GREEN STERILE FF (TOWEL DISPOSABLE) ×3 IMPLANT
UNDERPAD 30X36 HEAVY ABSORB (UNDERPADS AND DIAPERS) ×3 IMPLANT
WATER STERILE IRR 1000ML POUR (IV SOLUTION) ×3 IMPLANT

## 2020-08-28 NOTE — Op Note (Signed)
DATE OF SERVICE: 08/28/2020  PATIENT:  [name]  75 y.o. male  PRE-OPERATIVE DIAGNOSIS:  Right fifth metatarsal osteomyelitis; application of wound vac.  POST-OPERATIVE DIAGNOSIS:  Same  PROCEDURE:   Open right fifth toe ray amputation  SURGEON:  Surgeon(s) and Role:    * Cherre Robins, MD - Primary  ASSISTANT: none  ANESTHESIA:   general  EBL: 137mL  BLOOD ADMINISTERED:none  DRAINS: none   LOCAL MEDICATIONS USED:  NONE  SPECIMEN:  Infected bone for gram stain / culture  COUNTS: confirmed correct.  TOURNIQUET:  None  PATIENT DISPOSITION:  PACU - hemodynamically stable.   Delay start of Pharmacological VTE agent (>24hrs) due to surgical blood loss or risk of bleeding: no  INDICATION FOR PROCEDURE: AKEEM HEPPLER is a 75 y.o. male with ulceration overlaying an area of osteomyelitis of the right fifth metatarsal. After careful discussion of risks, benefits, and alternatives the patient was offered ray amputation. We specifically discussed risk of poor healing. The patient understood and wished to proceed.  OPERATIVE FINDINGS: robust bleeding at incision. Woody foot did not permit closure despite "tennis racket" incision. Partially closed the incision and placed wound vac.  DESCRIPTION OF PROCEDURE: After identification of the patient in the pre-operative holding area, the patient was transferred to the operating room. The patient was positioned supine on the operating room table. Anesthesia was induced. The right foot and ankle was prepped and draped in standard fashion. A surgical pause was performed confirming correct patient, procedure, and operative location.  An elliptical form incision was made about the base of the right fifth toe including a dime sized area of ulceration about the right plantar/lateral foot.  Incision was carried up to the midfoot over the right fifth metatarsal.  A periosteal elevator was used to free the metatarsal from the surrounding soft  tissue.  An angled bone cutter was used to transect the metatarsal high in the midfoot.  The remainder of the toe was removed and passed off the table.  The infected bone was placed in a culture container for microbiologic evaluation.  Hemostasis was achieved in the surgical bed.  The wound was partially closed with 2 oh mattress sutures in the lateral midfoot.  A wound VAC was applied to the open wound.  Upon completion of the case instrument and sharps counts were confirmed correct. The patient was transferred to the PACU in good condition. I was present for all portions of the procedure.  [name]. Stanford Breed, MD [facility] Phone Number: [phone] 08/28/2020 1:31 PM

## 2020-08-28 NOTE — Transfer of Care (Signed)
Immediate Anesthesia Transfer of Care Note  Patient: Joel Perez  Procedure(s) Performed: AMPUTATION RAY FIFTH TOE (Right Foot) APPLICATION OF WOUND VAC, right foot (Right Foot)  Patient Location: PACU  Anesthesia Type:General  Level of Consciousness: awake, alert  and oriented  Airway & Oxygen Therapy: Patient Spontanous Breathing and Patient connected to nasal cannula oxygen  Post-op Assessment: Report given to RN and Post -op Vital signs reviewed and stable  Post vital signs: Reviewed and stable  Last Vitals:  Vitals Value Taken Time  BP 142/72   Temp    Pulse 85 08/28/20 1326  Resp 13 08/28/20 1326  SpO2 95 % 08/28/20 1326  Vitals shown include unvalidated device data.  Last Pain:  Vitals:   08/28/20 1000  TempSrc:   PainSc: 0-No pain      Patients Stated Pain Goal: 0 (44/17/12 7871)  Complications: No complications documented.

## 2020-08-28 NOTE — Progress Notes (Addendum)
Pt to EOB to dangle and moderate amount of blood with clots was on the floor. Wound vac unable to make seal. Pt returned to bed and foot elevated. MD paged.  MD at bedside and kerlix and ace compression dressing applied to foot.   Clyde Canterbury, RN

## 2020-08-28 NOTE — Progress Notes (Signed)
Pharmacy Antibiotic Note  Joel Perez is a 75 y.o. male admitted on 08/25/2020 with history of COPD with chronic hypoxemia on 4 L O2, chronic diastolic CHF, IVCD, chronic venous insufficiency with stasis dermatitis, PAD, neuropathy, recent hospitalization for pneumonia 1 month ago presents to the ED from home for evaluation of worsening right foot wound for the last 2 days  Pharmacy has been consulted for vancomycin and cefepime dosing.  Patient currently afebrile, wbc stable with slow trend up to 12 today, renal function normal. Nothing speciated from cultures, still a mixture with nothing on BCID.   Plan:  Continue Cefepime 2g q8 hours Metronidazole 500mg  q8 hours Vancomycin 1500mg  q24 hours -Patient not due for trough levels Follow up culture data  Height: 5\' 10"  (177.8 cm) Weight: 111.1 kg (244 lb 14.9 oz) IBW/kg (Calculated) : 73  Temp (24hrs), Avg:97.9 F (36.6 C), Min:97.6 F (36.4 C), Max:98.4 F (36.9 C)  Recent Labs  Lab 08/25/20 1619 08/25/20 1746 08/26/20 0344 08/26/20 1454 08/27/20 0059 08/27/20 2113 08/28/20 0129  WBC 15.8*  --  11.3*  --  10.8* 11.9* 12.0*  CREATININE 1.03  --  0.80 1.02 0.97 0.91 0.79  LATICACIDVEN 2.6* 1.9  --   --   --   --   --     Estimated Creatinine Clearance: 96.5 mL/min (by C-G formula based on SCr of 0.79 mg/dL).    Allergies  Allergen Reactions  . Other Swelling and Other (See Comments)    Farmed Fish (tightness in throat & lip swelling)  . Codeine Rash  . Sulfa Antibiotics Hives    Antimicrobials this admission: 4/4 vanc >> 4/4 zosyn >> 4/5 4/5 Metronidazole >>  4/5 cefepime >>  Microbiology results: 4/4BCx: GPC, BCID with NO detection 4/4 foot culture: abundant GPC in pairs and clusters, abundant GNR, few GPR  Thank you for allowing pharmacy to be a part of this patient's care.  Erin Hearing PharmD., BCPS Clinical Pharmacist 08/28/2020 8:34 AM

## 2020-08-28 NOTE — Progress Notes (Signed)
Pt arrived from PACU, VSS, wound vac dressing is clean, dry and intact with 125 suction set. Doppler right foot. Call light in reach. Will continue to monitor.  Chrisandra Carota, RN 08/28/2020

## 2020-08-28 NOTE — Progress Notes (Signed)
   Called patient bedside for all loss of seal on wound VAC with bleeding through the sponge.  VAC was removed there was diffuse oozing.  Wet-to-dry dressing was placed with an Ace wrap tightly.  Plan will be to reinforce wound as necessary with return to ER if excessive bleeding occurs.  I discussed this plan with the patient at the bedside.  Toshia Larkin C. Donzetta Matters, MD [facility] [place] Office: [phone] Pager: [phone]

## 2020-08-28 NOTE — Progress Notes (Addendum)
  Progress Note    08/28/2020 7:06 AM 1 Day Post-Op  Subjective:  No complaints  afebrile  Vitals:   08/28/20 0012 08/28/20 0523  BP: 138/64 126/62  Pulse: 75 77  Resp: 17 10  Temp: 97.9 F (36.6 C) 97.6 F (36.4 C)  SpO2: 97% 96%    Physical Exam: Cardiac:  regular Lungs:  Non labored Incisions:  Left groin without hematoma Extremities:  Brisk bilateral DP/PT doppler signals   CBC    Component Value Date/Time   WBC 12.0 (H) 08/28/2020 0129   RBC 3.63 (L) 08/28/2020 0129   HGB 10.7 (L) 08/28/2020 0129   HCT 34.6 (L) 08/28/2020 0129   PLT 444 (H) 08/28/2020 0129   MCV 95.3 08/28/2020 0129   MCH 29.5 08/28/2020 0129   MCHC 30.9 08/28/2020 0129   RDW 14.4 08/28/2020 0129   LYMPHSABS 2.0 08/28/2020 0129   MONOABS 0.9 08/28/2020 0129   EOSABS 0.1 08/28/2020 0129   BASOSABS 0.0 08/28/2020 0129    BMET    Component Value Date/Time   NA 136 08/28/2020 0129   NA 140 01/04/2019 1451   K 3.8 08/28/2020 0129   CL 104 08/28/2020 0129   CO2 26 08/28/2020 0129   GLUCOSE 76 08/28/2020 0129   BUN 9 08/28/2020 0129   BUN 12 01/04/2019 1451   CREATININE 0.79 08/28/2020 0129   CALCIUM 8.3 (L) 08/28/2020 0129   GFRNONAA >60 08/28/2020 0129   GFRAA 91 01/04/2019 1451    INR No results found for: INR   Intake/Output Summary (Last 24 hours) at 08/28/2020 0706 Last data filed at 08/27/2020 2300 Gross per 24 hour  Intake 240 ml  Output 550 ml  Net -310 ml     Assessment:  75 y.o. male is s/p:  Right lower extremity angiogram with third order cannulation (151mL total contrast)  1 Day Post-Op  Plan: -pt with brisk doppler signals bilateral DP/PT.  Aortogram revealed no evidence of hemodynamically significant arterial disease requiring revascularization.  For OR today for toe amp.    Leontine Locket, PA-C [facility] [phone] 08/28/2020 7:06 AM  VASCULAR STAFF ADDENDUM: I have independently interviewed and examined the patient. I agree  with the above.  To OR today for right fifth toe ray amputation Counseled that he has moderate risk for poor healing  [name]. Stanford Breed, MD [facility] Phone Number: [phone] 08/28/2020 9:24 AM

## 2020-08-28 NOTE — Anesthesia Postprocedure Evaluation (Signed)
Anesthesia Post Note  Patient: Joel Perez  Procedure(s) Performed: AMPUTATION RAY FIFTH TOE (Right Foot) APPLICATION OF WOUND VAC, right foot (Right Foot)     Patient location during evaluation: PACU Anesthesia Type: General Level of consciousness: awake and alert, awake and oriented Pain management: pain level controlled Vital Signs Assessment: post-procedure vital signs reviewed and stable Respiratory status: spontaneous breathing, nonlabored ventilation and respiratory function stable Cardiovascular status: blood pressure returned to baseline and stable Postop Assessment: no apparent nausea or vomiting Anesthetic complications: no   No complications documented.  Last Vitals:  Vitals:   08/28/20 1426 08/28/20 1637  BP: 129/68 (!) 111/48  Pulse: 95   Resp: 15 16  Temp: (!) 36.4 C 36.7 C  SpO2: 91% 90%    Last Pain:  Vitals:   08/28/20 1637  TempSrc: Oral  PainSc:                  Catalina Gravel

## 2020-08-28 NOTE — Progress Notes (Signed)
PROGRESS NOTE  JERI RAWLINS NTZ:[id number] DOB: 07/06/1945 DOA: 08/25/2020 PCP: [name], MD  HPI/Recap of past 75 hours: 75 year old M with PMH of diastolic CHF, LBBB, PAD, neuropathy, stasis dermatitis, COPD, chronic hypoxic RF on 4 L and obesity presented to ED with worsening right foot wound, and admitted for sepsis due to right fifth MTP joint osteomyelitis and infected right foot ulcer.  He was started on IV vancomycin and Zosyn.  Blood cultures NGTD.  Vascular surgery, Dr. Donnetta Hutching consulted and recommended transfer from [name] to [facility] for arteriogram and further care.  Status post open right fifth toe ray amputation by Dr. Stanford Breed on 08/28/2020.  08/28/20: Patient was seen and examined this morning with his wife present.  He has no new complaints.  He denies any cardiopulmonary symptoms.    Assessment/Plan: Principal Problem:   Osteomyelitis ([facility]) Active Problems:   COPD III with marked reversibility   Chronic respiratory failure with hypoxia (HCC)   PVD (peripheral vascular disease) (HCC)   Sepsis (HCC)   POD #0 post open right fifth toe ray amputation due to severe sepsis from right foot fifth MTP joint osteomyelitis and infected right foot ulcer: POA.   Met criteria with leukocytosis, tachycardia and lactic acidosis.   Blood cultures negative to date.   History of peripheral artery disease.   Continue broad-spectrum IV antibiotics, currently on cefepime IV Flagyl and IV vancomycin.  -Vascular surgery following, appreciate assistance. -Post amputation, wound VAC application -Local wound care and pain control per vascular surgery.  Peripheral artery disease Continue statin Restart antiplatelet when okay with vascular surgery.  Chronic diastolic CHF  Euvolemic. TTE on 07/22/2020 with LVEF of 60 to 65%, mild LVH and indeterminate DD. -Continue home Lasix 20 mg daily -Monitor fluid and renal status while on diuretics Net I&O -1.5 L Continue strict I's and  O's and daily weight.  Chronic COPD/chronic hypoxic respiratory failure:  Stable on home 4 L.   Currently at baseline. Continue home prednisone 20 mg   Followed by pulmonology. Wean off home prednisone as it may affect wound healing after surgery -Continue home inhalers, albuterol nebulizer as needed.  -Continue supplemental oxygen.  Essential hypertension:  BP is currently at goal. -Continue home Lasix and losartan  Neuropathy -Continue home gabapentin.  Class II obesity Body mass index is 35.01 kg/m. Pressure Injury 07/22/20 Buttocks Left Stage 2 -  Partial thickness loss of dermis presenting as a shallow open injury with a red, pink wound bed without slough. (Active)  07/22/20 1600  Location: Buttocks  Location Orientation: Left  Staging: Stage 2 -  Partial thickness loss of dermis presenting as a shallow open injury with a red, pink wound bed without slough.  Wound Description (Comments):   Present on Admission: Yes     Pressure Injury 07/23/20 Heel Right Stage 3 -  Full thickness tissue loss. Subcutaneous fat may be visible but bone, tendon or muscle are NOT exposed. (Active)  07/23/20 0800  Location: Heel  Location Orientation: Right  Staging: Stage 3 -  Full thickness tissue loss. Subcutaneous fat may be visible but bone, tendon or muscle are NOT exposed.  Wound Description (Comments):   Present on Admission: Yes   DVT prophylaxis:  SCDs Start: 08/25/20 2132  Code Status: Full code Family Communication: None at bedside.    Level of care: Telemetry Medical Status is: Inpatient  Remains inpatient appropriate because:Ongoing diagnostic testing needed not appropriate for outpatient work up, Unsafe d/c plan, IV treatments appropriate  due to intensity of illness or inability to take PO and Inpatient level of care appropriate due to severity of illness   Dispo: The patient is from: Home  Anticipated d/c is to: Home with home health services once  vascular surgery signs off.  Patient currently is not medically stable to d/c.              Difficult to place patient not applicable.       Consultants:  Vascular surgery, Dr. Donnetta Hutching          Objective: Vitals:   08/28/20 1147 08/28/20 1330 08/28/20 1415 08/28/20 1426  BP:    129/68  Pulse:   82 95  Resp:   (!) 9 15  Temp:  98 F (36.7 C)  (!) 97.5 F (36.4 C)  TempSrc:    Oral  SpO2:   96% 91%  Weight: 111 kg     Height: $Remove'5\' 10"'iaLHpIu$  (1.778 m)       Intake/Output Summary (Last 24 hours) at 08/28/2020 1606 Last data filed at 08/28/2020 1438 Gross per 24 hour  Intake 840 ml  Output 1050 ml  Net -210 ml   Filed Weights   08/25/20 1541 08/26/20 1959 08/28/20 1147  Weight: 110.7 kg 111.1 kg 111 kg    Exam:  . General: 75 y.o. year-old male chronically ill-appearing no acute distress.  He is alert and oriented x3.  Very hard of hearing.   . Cardiovascular: Regular rate and rhythm no rubs or gallops.   Marland Kitchen Respiratory: Clear to auscultation no wheezes or rales.   . Abdomen: Soft nontender no bowel sounds present.   . Musculoskeletal: Trace lower extremity edema bilaterally.  Right ulcerative lesions affecting plantar fifth toe.   . Skin: Right fifth toe plantar ulcerative lesion. Marland Kitchen Psychiatry: Mood is appropriate for condition and setting.   Data Reviewed: CBC: Recent Labs  Lab 08/25/20 1619 08/26/20 0344 08/27/20 0059 08/27/20 2113 08/28/20 0129  WBC 15.8* 11.3* 10.8* 11.9* 12.0*  NEUTROABS 14.3*  --   --   --  8.7*  HGB 11.4* 10.0* 10.3* 10.2* 10.7*  HCT 36.6* 32.2* 32.6* 32.5* 34.6*  MCV 96.1 96.1 95.9 95.6 95.3  PLT 494* 431* 404* 475* 563*   Basic Metabolic Panel: Recent Labs  Lab 08/25/20 1619 08/26/20 0344 08/26/20 1454 08/27/20 0059 08/27/20 2113 08/28/20 0129  NA 137  --  141 138  --  136  K 4.4  --  4.0 4.1  --  3.8  CL 101  --  106 106  --  104  CO2 27  --  28 27  --  26  GLUCOSE 118*  --  160* 143*  --  76  BUN 13   --  11 10  --  9  CREATININE 1.03 0.80 1.02 0.97 0.91 0.79  CALCIUM 8.8*  --  8.6* 8.3*  --  8.3*  MG  --   --   --  2.3  --   --   PHOS  --   --  1.8* 3.9  --   --    GFR: Estimated Creatinine Clearance: 96.5 mL/min (by C-G formula based on SCr of 0.79 mg/dL). Liver Function Tests: Recent Labs  Lab 08/25/20 1619 08/26/20 1454 08/27/20 0059  AST 27  --   --   ALT 21  --   --   ALKPHOS 64  --   --   BILITOT 0.7  --   --   PROT 7.8  --   --  ALBUMIN 3.4* 2.8* 2.5*   No results for input(s): LIPASE, AMYLASE in the last 168 hours. No results for input(s): AMMONIA in the last 168 hours. Coagulation Profile: No results for input(s): INR, PROTIME in the last 168 hours. Cardiac Enzymes: No results for input(s): CKTOTAL, CKMB, CKMBINDEX, TROPONINI in the last 168 hours. BNP (last 3 results) No results for input(s): PROBNP in the last 8760 hours. HbA1C: Recent Labs    08/26/20 1454  HGBA1C 6.3*   CBG: No results for input(s): GLUCAP in the last 168 hours. Lipid Profile: Recent Labs    08/27/20 0059  CHOL 129  HDL 37*  LDLCALC 74  TRIG 90  CHOLHDL 3.5   Thyroid Function Tests: No results for input(s): TSH, T4TOTAL, FREET4, T3FREE, THYROIDAB in the last 72 hours. Anemia Panel: No results for input(s): VITAMINB12, FOLATE, FERRITIN, TIBC, IRON, RETICCTPCT in the last 72 hours. Urine analysis:    Component Value Date/Time   COLORURINE YELLOW 08/25/2020 1620   APPEARANCEUR CLEAR 08/25/2020 1620   LABSPEC 1.008 08/25/2020 1620   PHURINE 8.0 08/25/2020 1620   GLUCOSEU NEGATIVE 08/25/2020 1620   HGBUR SMALL (A) 08/25/2020 1620   BILIRUBINUR NEGATIVE 08/25/2020 1620   KETONESUR NEGATIVE 08/25/2020 1620   PROTEINUR NEGATIVE 08/25/2020 1620   NITRITE NEGATIVE 08/25/2020 1620   LEUKOCYTESUR NEGATIVE 08/25/2020 1620   Sepsis Labs: $RemoveBefo'@LABRCNTIP'[id number]$ (procalcitonin:4,lacticidven:4)  ) Recent Results (from the past 240 hour(s))  Blood culture (routine x 2)     Status: None  (Preliminary result)   Collection Time: 08/25/20  5:15 PM   Specimen: BLOOD  Result Value Ref Range Status   Specimen Description   Final    BLOOD LEFT ANTECUBITAL Performed at [facility], [place] [address] [place], [place] [address]    Special Requests   Final    BOTTLES DRAWN AEROBIC ONLY Blood Culture results may not be optimal due to an inadequate volume of blood received in culture bottles Performed at [place] [address], [place], [place] [address]    Culture   Final    NO GROWTH 3 DAYS Performed at [facility], [place] [address], [place], [place] [address]    Report Status PENDING  Incomplete  Blood culture (routine x 2)     Status: None (Preliminary result)   Collection Time: 08/25/20  5:26 PM   Specimen: BLOOD  Result Value Ref Range Status   Specimen Description   Final    BLOOD RIGHT ANTECUBITAL Performed at [place] [address], [place], [place] [address]    Special Requests   Final    BOTTLES DRAWN AEROBIC AND ANAEROBIC Blood Culture adequate volume Performed at [facility] [address], [place], [place] [address]    Culture  Setup Time   Final    GRAM POSITIVE COCCI ANAEROBIC BOTTLE ONLY CRITICAL RESULT CALLED TO, READ BACK BY AND VERIFIED WITH: Limmie Patricia [phone] [id number] FCP Performed at [facility], [place] [address] [place], [place] [address]    Culture GRAM POSITIVE COCCI  Final   Report Status PENDING  Incomplete  Aerobic Culture w Gram Stain (superficial specimen)     Status: None (Preliminary result)   Collection Time: 08/25/20  5:26 PM   Specimen: Foot  Result Value Ref Range Status   Specimen Description   Final    FOOT RIGHT Performed at [place] [address] [place], [place] [address]    Special Requests  Final    NONE Performed at [facility], [address] [place], [place] [address]    Gram  Stain   Final    FEW WBC PRESENT, PREDOMINANTLY PMN ABUNDANT GRAM POSITIVE COCCI IN PAIRS IN CLUSTERS ABUNDANT GRAM NEGATIVE RODS FEW GRAM POSITIVE RODS    Culture   Final    ABUNDANT GROUP B STREP(S.AGALACTIAE)ISOLATED TESTING AGAINST S. AGALACTIAE NOT ROUTINELY PERFORMED DUE TO PREDICTABILITY OF AMP/PEN/VAN SUSCEPTIBILITY. FEW STAPHYLOCOCCUS AUREUS SUSCEPTIBILITIES TO FOLLOW Performed at [facility], [address], [place], [place] [address]    Report Status PENDING  Incomplete  Blood Culture ID Panel (Reflexed)     Status: None   Collection Time: 08/25/20  5:26 PM  Result Value Ref Range Status   Enterococcus faecalis NOT DETECTED NOT DETECTED Final   Enterococcus Faecium NOT DETECTED NOT DETECTED Final   Listeria monocytogenes NOT DETECTED NOT DETECTED Final   Staphylococcus species NOT DETECTED NOT DETECTED Final   Staphylococcus aureus (BCID) NOT DETECTED NOT DETECTED Final   Staphylococcus epidermidis NOT DETECTED NOT DETECTED Final   Staphylococcus lugdunensis NOT DETECTED NOT DETECTED Final   Streptococcus species NOT DETECTED NOT DETECTED Final   Streptococcus agalactiae NOT DETECTED NOT DETECTED Final   Streptococcus pneumoniae NOT DETECTED NOT DETECTED Final   Streptococcus pyogenes NOT DETECTED NOT DETECTED Final   A.calcoaceticus-baumannii NOT DETECTED NOT DETECTED Final   Bacteroides fragilis NOT DETECTED NOT DETECTED Final   Enterobacterales NOT DETECTED NOT DETECTED Final   Enterobacter cloacae complex NOT DETECTED NOT DETECTED Final   Escherichia coli NOT DETECTED NOT DETECTED Final   Klebsiella aerogenes NOT DETECTED NOT DETECTED Final   Klebsiella oxytoca NOT DETECTED NOT DETECTED Final   Klebsiella pneumoniae NOT DETECTED NOT DETECTED Final   Proteus species NOT DETECTED NOT DETECTED Final   Salmonella species NOT DETECTED NOT DETECTED Final   Serratia marcescens NOT DETECTED NOT DETECTED Final   Haemophilus influenzae NOT DETECTED NOT DETECTED  Final   Neisseria meningitidis NOT DETECTED NOT DETECTED Final   Pseudomonas aeruginosa NOT DETECTED NOT DETECTED Final   Stenotrophomonas maltophilia NOT DETECTED NOT DETECTED Final   Candida albicans NOT DETECTED NOT DETECTED Final   Candida auris NOT DETECTED NOT DETECTED Final   Candida glabrata NOT DETECTED NOT DETECTED Final   Candida krusei NOT DETECTED NOT DETECTED Final   Candida parapsilosis NOT DETECTED NOT DETECTED Final   Candida tropicalis NOT DETECTED NOT DETECTED Final   Cryptococcus neoformans/gattii NOT DETECTED NOT DETECTED Final    Comment: Performed at [facility], [address], [place], [place] [address]  Resp Panel by RT-PCR (Flu A&B, Covid) Nasopharyngeal Swab     Status: None   Collection Time: 08/25/20  6:43 PM   Specimen: Nasopharyngeal Swab; Nasopharyngeal(NP) swabs in vial transport medium  Result Value Ref Range Status   SARS Coronavirus 2 by RT PCR NEGATIVE NEGATIVE Final    Comment: (NOTE) SARS-CoV-2 target nucleic acids are NOT DETECTED.  The SARS-CoV-2 RNA is generally detectable in upper respiratory specimens during the acute phase of infection. The lowest concentration of SARS-CoV-2 viral copies this assay can detect is 138 copies/mL. A negative result does not preclude SARS-Cov-2 infection and should not be used as the sole basis for treatment or other patient management decisions. A negative result may occur with  improper specimen collection/handling, submission of specimen other than nasopharyngeal swab, presence of viral mutation(s) within the areas targeted by this assay, and inadequate number of viral copies(<138 copies/mL).  A negative result must be combined with clinical observations, patient history, and epidemiological information. The expected result is Negative.  Fact Sheet for Patients:  BloggerCourse.com  Fact Sheet for Healthcare Providers:  SeriousBroker.it  This test  is no t yet approved or cleared by the [place] FDA and  has been authorized for detection and/or diagnosis of SARS-CoV-2 by FDA under an Emergency Use Authorization (EUA). This EUA will remain  in effect (meaning this test can be used) for the duration of the COVID-19 declaration under Section 564(b)(1) of the Act, 21 U.S.C.section 360bbb-3(b)(1), unless the authorization is terminated  or revoked sooner.       Influenza A by PCR NEGATIVE NEGATIVE Final   Influenza B by PCR NEGATIVE NEGATIVE Final    Comment: (NOTE) The Xpert Xpress SARS-CoV-2/FLU/RSV plus assay is intended as an aid in the diagnosis of influenza from Nasopharyngeal swab specimens and should not be used as a sole basis for treatment. Nasal washings and aspirates are unacceptable for Xpert Xpress SARS-CoV-2/FLU/RSV testing.  Fact Sheet for Patients: BloggerCourse.com  Fact Sheet for Healthcare Providers: SeriousBroker.it  This test is not yet approved or cleared by the [place] FDA and has been authorized for detection and/or diagnosis of SARS-CoV-2 by FDA under an Emergency Use Authorization (EUA). This EUA will remain in effect (meaning this test can be used) for the duration of the COVID-19 declaration under Section 564(b)(1) of the Act, 21 U.S.C. section 360bbb-3(b)(1), unless the authorization is terminated or revoked.  Performed at [facility], [address] [place], [place] [address]   Surgical PCR screen     Status: Abnormal   Collection Time: 08/28/20 10:47 AM   Specimen: Nasal Mucosa; Nasal Swab  Result Value Ref Range Status   MRSA, PCR NEGATIVE NEGATIVE Final   Staphylococcus aureus POSITIVE (A) NEGATIVE Final    Comment: (NOTE) The Xpert SA Assay (FDA approved for NASAL specimens in patients 55 years of age and older), is one component of a comprehensive surveillance program. It is not intended to diagnose  infection nor to guide or monitor treatment. Performed at [facility], [address], [place], [place] [address]       Studies: PERIPHERAL VASCULAR CATHETERIZATION  Result Date: 08/27/2020 DATE OF SERVICE: 08/27/2020  PATIENT:  Vernia Buff  75 y.o. male  PRE-OPERATIVE DIAGNOSIS:  Atherosclerosis of native arteries of right lower extremity causing osteomyelitis  POST-OPERATIVE DIAGNOSIS:  Same  PROCEDURE:  1) US guided left common femoral access 2) Aortogram 3) Right lower extremity angiogram with third order cannulation ( total contrast) 4) Conscious sedation (27 minutes)  SURGEON:  Surgeon(s) and Role:    * Leonie Douglas, MD - Primary  ASSISTANT: none  ANESTHESIA:   local and IV sedation  EBL: min  BLOOD ADMINISTERED:none  DRAINS: none  LOCAL MEDICATIONS USED:  LIDOCAINE  SPECIMEN:  none  COUNTS: confirmed correct.  TOURNIQUET:  None  PATIENT DISPOSITION:  PACU - hemodynamically stable.  Delay start of Pharmacological VTE agent (>24hrs) due to surgical blood loss or risk of bleeding: no  INDICATION FOR PROCEDURE: NIKHIL OSEI is a 75 y.o. male with right fifth metatarsal osteomyelitis and non-invasive vascular lab evidence of peripheral arterial disease. After careful discussion of risks, benefits, and alternatives the patient was offered angiography with intervention. The patient understood and wished to proceed.  OPERATIVE FINDINGS: No flow limiting disease in aortoiliac arteries Ectasia of bilateral common iliac arteries  Right lower extremity: Mild atherosclerosis,  but no flow limiting disease in R CFA / R PFA / R SFA Mild atherosclerosis, but no flow limiting disease in R popliteal artery Mild atherosclerosis, but no flow limiting disease inTibial trifurcation Moderate disease throughout the tibial vessels, but all three vessels course to the foot. Brisk flow into the pedal arch and pedal vessels. One area of focal atherosclerotic change in Hunter's canal was  interrogated with direct measurement of pressure below and above. No difference in pressure noted.  DESCRIPTION OF PROCEDURE: After identification of the patient in the pre-operative holding area, the patient was transferred to the operating room. The patient was positioned supine on the operating room table. Anesthesia was induced. The groins was prepped and draped in standard fashion. A surgical pause was performed confirming correct patient, procedure, and operative location.  The left groin was anesthetized with subcutaneous injection of 1% lidocaine. Using ultrasound guidance, the left common femoral artery was accessed with micropuncture technique. Fluoroscopy was used to confirm cannulation over the femoral head. Sheathogram was not performed. The 64F sheath was upsized to 84F.  An 035 glidewire advantage was advanced into the distal aorta. Over the wire an omni flush catheter was advanced to the level of L2. Aortogram was performed - see above for details.  The right common iliac artery was selected with the 035 glidewire advantage. The wire was advanced into the common femoral artery. Over the wire the omni flush catheter was advanced into the external iliac artery. Selective angiography was performed - see above for details.  A mynx device was used to close the arteriotomy. Hemostasis was excellent upon completion.  Conscious sedation was administered with the use of IV fentanyl and midazolam under continuous physician and nurse monitoring. Heart rate, blood pressure, and oxygen saturation were continuously monitored. Total sedation time was 27 minutes  Upon completion of the case instrument and sharps counts were confirmed correct. The patient was transferred to the PACU in good condition. I was present for all portions of the procedure.  PLAN: no evidence of hemodynamically significant arterial disease requiring revascularization. Needs best medical therapy for PAD: ASA / Statin indefinitely.   [name]. Stanford Breed, MD [facility] Phone Number: [phone] 08/27/2020 5:24 PM     Scheduled Meds: . aspirin EC  81 mg Oral Daily  . atorvastatin  40 mg Oral Daily  . fentaNYL      . fluticasone furoate-vilanterol  1 puff Inhalation Daily  . furosemide  20 mg Oral Daily  . gabapentin  600 mg Oral Q8H  . heparin  5,000 Units Subcutaneous Q8H  . losartan  50 mg Oral Daily  . mupirocin ointment  1 application Nasal BID  . predniSONE  20 mg Oral Q breakfast  . sodium chloride flush  3 mL Intravenous Q12H  . traMADol      . umeclidinium bromide  1 puff Inhalation Daily    Continuous Infusions: . sodium chloride    . ceFEPime (MAXIPIME) IV 2 g (08/28/20 1010)  . lactated ringers 10 mL/hr at 08/28/20 1148  . metronidazole 500 mg (08/28/20 0556)  . vancomycin 1,500 mg (08/27/20 1251)     LOS: 3 days     Kayleen Memos, MD [facility] Pager [phone]  If 7PM-7AM, please contact night-coverage [email or web address] Password [facility] 08/28/2020, 4:06 PM

## 2020-08-28 NOTE — Anesthesia Preprocedure Evaluation (Addendum)
Anesthesia Evaluation  Patient identified by MRN, date of birth, ID band Patient awake    Reviewed: Allergy & Precautions, NPO status , Patient's Chart, lab work & pertinent test results  Airway Mallampati: II  TM Distance: >3 FB Neck ROM: Full    Dental  (+) Teeth Intact, Dental Advisory Given   Pulmonary COPD, former smoker,    Pulmonary exam normal breath sounds clear to auscultation       Cardiovascular hypertension, Pt. on medications + Peripheral Vascular Disease and + DOE  Normal cardiovascular exam+ Valvular Problems/Murmurs AI and MVP  Rhythm:Regular Rate:Normal     Neuro/Psych  Neuromuscular disease    GI/Hepatic negative GI ROS, (+)     substance abuse  alcohol use,   Endo/Other  Obesity   Renal/GU negative Renal ROS     Musculoskeletal negative musculoskeletal ROS (+) Osteomyelitis    Abdominal   Peds  Hematology  (+) Blood dyscrasia, anemia ,   Anesthesia Other Findings Day of surgery medications reviewed with the patient.  Reproductive/Obstetrics                            Anesthesia Physical Anesthesia Plan  ASA: III  Anesthesia Plan: General   Post-op Pain Management:    Induction: Intravenous  PONV Risk Score and Plan: 2 and Dexamethasone and Ondansetron  Airway Management Planned: LMA  Additional Equipment:   Intra-op Plan:   Post-operative Plan: Extubation in OR  Informed Consent: I have reviewed the patients History and Physical, chart, labs and discussed the procedure including the risks, benefits and alternatives for the proposed anesthesia with the patient or authorized representative who has indicated his/her understanding and acceptance.     Dental advisory given  Plan Discussed with: CRNA  Anesthesia Plan Comments:        Anesthesia Quick Evaluation

## 2020-08-28 NOTE — Progress Notes (Signed)
Mobility Specialist: Progress Note   08/28/20 1618  Mobility  Activity Dangled on edge of bed  Level of Assistance Contact guard assist, steadying assist  Assistive Device None  Mobility Response Tolerated well  Mobility performed by Mobility specialist  $Mobility charge 1 Mobility   Pre-Mobility: 92 HR, 97% SpO2  Attempted to stand with pt using darco shoe but pt wound vac lost seal, RN notified. Pt was contact guard to sit EOB. Pt c/o shooting pain in RLE during transition from supine to sitting EOB. Pt back in bed with RN present in room.   [facility] Mobility Specialist Mobility Specialist Phone: [phone]

## 2020-08-28 NOTE — Anesthesia Procedure Notes (Signed)
Procedure Name: LMA Insertion Date/Time: 08/28/2020 12:33 PM Performed by: Dorthea Cove, CRNA Pre-anesthesia Checklist: Patient identified, Emergency Drugs available, Suction available and Patient being monitored Patient Re-evaluated:Patient Re-evaluated prior to induction Oxygen Delivery Method: Circle System Utilized Preoxygenation: Pre-oxygenation with 100% oxygen Induction Type: IV induction Ventilation: Mask ventilation without difficulty LMA: LMA inserted LMA Size: 5.0 Number of attempts: 1 Airway Equipment and Method: Bite block Placement Confirmation: positive ETCO2 Tube secured with: Tape Dental Injury: Teeth and Oropharynx as per pre-operative assessment

## 2020-08-28 NOTE — Progress Notes (Signed)
Orthopedic Tech Progress Note Patient Details:  TATEN MERROW 1945-12-31 [id number]  Ortho Devices Type of Ortho Device: Darco shoe Ortho Device/Splint Location: RLE Ortho Device/Splint Interventions: Ordered   Post Interventions Patient Tolerated: Other (comment) Instructions Provided: Poper ambulation with device,Care of device,Adjustment of device   Coady Train 08/28/2020, 4:39 PM

## 2020-08-29 ENCOUNTER — Encounter (HOSPITAL_COMMUNITY): Payer: Self-pay | Admitting: Vascular Surgery

## 2020-08-29 DIAGNOSIS — E782 Mixed hyperlipidemia: Secondary | ICD-10-CM | POA: Diagnosis not present

## 2020-08-29 DIAGNOSIS — I5032 Chronic diastolic (congestive) heart failure: Secondary | ICD-10-CM | POA: Diagnosis not present

## 2020-08-29 DIAGNOSIS — I10 Essential (primary) hypertension: Secondary | ICD-10-CM | POA: Diagnosis not present

## 2020-08-29 DIAGNOSIS — J449 Chronic obstructive pulmonary disease, unspecified: Secondary | ICD-10-CM | POA: Diagnosis not present

## 2020-08-29 LAB — RENAL FUNCTION PANEL
Albumin: 2.6 g/dL — ABNORMAL LOW (ref 3.5–5.0)
Anion gap: 6 (ref 5–15)
BUN: 12 mg/dL (ref 8–23)
CO2: 28 mmol/L (ref 22–32)
Calcium: 8.5 mg/dL — ABNORMAL LOW (ref 8.9–10.3)
Chloride: 104 mmol/L (ref 98–111)
Creatinine, Ser: 0.83 mg/dL (ref 0.61–1.24)
GFR, Estimated: >60 mL/min (ref >60)
Glucose, Bld: 110 mg/dL — ABNORMAL HIGH (ref 70–99)
Phosphorus: 3.2 mg/dL (ref 2.5–4.6)
Potassium: 4.2 mmol/L (ref 3.5–5.1)
Sodium: 138 mmol/L (ref 135–145)

## 2020-08-29 LAB — AEROBIC CULTURE W GRAM STAIN (SUPERFICIAL SPECIMEN)

## 2020-08-29 LAB — CBC
HCT: 32.6 % — ABNORMAL LOW (ref 39.0–52.0)
Hemoglobin: 10.3 g/dL — ABNORMAL LOW (ref 13.0–17.0)
MCH: 29.6 pg (ref 26.0–34.0)
MCHC: 31.6 g/dL (ref 30.0–36.0)
MCV: 93.7 fL (ref 80.0–100.0)
Platelets: 471 10*3/uL — ABNORMAL HIGH (ref 150–400)
RBC: 3.48 MIL/uL — ABNORMAL LOW (ref 4.22–5.81)
RDW: 14.1 % (ref 11.5–15.5)
WBC: 10.1 10*3/uL (ref 4.0–10.5)
nRBC: 0 % (ref 0.0–0.2)

## 2020-08-29 LAB — MAGNESIUM: Magnesium: 2.1 mg/dL (ref 1.7–2.4)

## 2020-08-29 LAB — VANCOMYCIN, TROUGH: Vancomycin Tr: 12 ug/mL — ABNORMAL LOW (ref 15–20)

## 2020-08-29 MED ORDER — VANCOMYCIN HCL 750 MG/150ML IV SOLN
750.0000 mg | Freq: Two times a day (BID) | INTRAVENOUS | Status: DC
  Administered 2020-08-29 – 2020-09-01 (×6): 750 mg via INTRAVENOUS
  Filled 2020-08-29 (×7): qty 150

## 2020-08-29 MED ORDER — HYDROMORPHONE HCL 1 MG/ML IJ SOLN
1.0000 mg | INTRAMUSCULAR | Status: DC | PRN
  Administered 2020-08-29 – 2020-09-03 (×14): 1 mg via INTRAVENOUS
  Filled 2020-08-29 (×14): qty 1

## 2020-08-29 MED ORDER — PREDNISONE 10 MG PO TABS
10.0000 mg | ORAL_TABLET | Freq: Every day | ORAL | Status: DC
  Administered 2020-08-30 – 2020-09-03 (×5): 10 mg via ORAL
  Filled 2020-08-29 (×5): qty 1

## 2020-08-29 MED ORDER — PREDNISONE 10 MG PO TABS: 10.0000 mg | ORAL_TABLET | Freq: Every day | ORAL | Status: DC

## 2020-08-29 MED ORDER — OXYCODONE-ACETAMINOPHEN 5-325 MG PO TABS
1.0000 | ORAL_TABLET | Freq: Four times a day (QID) | ORAL | Status: DC | PRN
  Administered 2020-08-29 – 2020-09-03 (×17): 2 via ORAL
  Filled 2020-08-29 (×17): qty 2

## 2020-08-29 MED ORDER — MORPHINE SULFATE (PF) 2 MG/ML IV SOLN
2.0000 mg | INTRAVENOUS | Status: DC | PRN
  Administered 2020-08-29: 2 mg via INTRAVENOUS
  Filled 2020-08-29: qty 1

## 2020-08-29 MED ORDER — HYDROMORPHONE HCL 1 MG/ML IJ SOLN
1.0000 mg | Freq: Once | INTRAMUSCULAR | Status: AC
Start: 2020-08-29 — End: 2020-08-29
  Administered 2020-08-29: 1 mg via INTRAVENOUS
  Filled 2020-08-29: qty 1

## 2020-08-29 NOTE — TOC Initial Note (Addendum)
Transition of Care (TOC) - Initial/Assessment Note  Marvetta Gibbons RN, BSN Transitions of Care Unit 4E- RN Case Manager See Treatment Team for direct phone #    Patient Details  Name: Joel Perez MRN: [id number] Date of Birth: 08/29/1945  Transition of Care [facility]) CM/SW Contact:    Dawayne Patricia, RN Phone Number: 08/29/2020, 4:36 PM  Clinical Narrative:                 Pt from home with wife, referral received for home wound VAC needs and HHRN/PT. [employer] home wound VAC form has been signed and faxed to [facility]. Spoke with Olivia Mackie who has started process for home wound VAC approval. Pending on insurance approval - home wound VAC will be delivered to hospital prior to discharge.   CM spoke with pt and wife at bedside for transition of care needs- list provided to pt/wife for [facility] choice- Per CMS guidelines from medicare.gov website with star ratings (copy placed in shadow chart)- per conversation they state pt already active with [facility] agency- [facility]. Would like to continue services with them.   Per pt he has all needed DME at home, including home 02- (can not remember home 02 provider). Wife to transport home.   Call made to Amy with [facility] to confirm [facility] services- [facility] will be able to continue [facility] services and has updated pt's [place] needs for home wound VAC.   Address, phone #s, PCP all confirmed with pt in epic.    Expected Discharge Plan: [place] Barriers to Discharge: Continued Medical Work up   Patient Goals and CMS Choice Patient states their goals for this hospitalization and ongoing recovery are:: return home and work out in the yard [employer].gov Compare Post Acute Care list provided to:: Patient Choice offered to / list presented to : Patient  Expected Discharge Plan and Services Expected Discharge Plan: [place]   Discharge Planning Services: CM Consult Post Acute Care Choice: Home Health,Durable Medical Equipment Living  arrangements for the past 2 months: Single Family Home                 DME Arranged: Vac DME Agency: [employer] Date DME Agency Contacted: 08/29/20 Time DME Agency Contacted: [id number] Representative spoke with at DME Agency: Olivia Mackie HH Arranged: RN,PT [place] Agency: [facility] [place] Date [place]: 08/29/20 Time [place]: 1600 Representative spoke with at [place]: [place] Arrangements/Services Living arrangements for the past 2 months: [place] with:: Spouse,Self Patient language and need for interpreter reviewed:: Yes Do you feel safe going back to the place where you live?: Yes      Need for Family Participation in Patient Care: Yes (Comment) Care giver support system in place?: Yes (comment) Current home services: DME Criminal Activity/Legal Involvement Pertinent to Current Situation/Hospitalization: No - Comment as needed  Activities of Daily Living Home Assistive Devices/Equipment: Walker (specify type),Cane (specify quad or straight),Bedside commode/3-in-1,Eyeglasses (reading glasses) ADL Screening (condition at time of admission) Patient's cognitive ability adequate to safely complete daily activities?: Yes Is the patient deaf or have difficulty hearing?: Yes Does the patient have difficulty seeing, even when wearing glasses/contacts?: Yes (blind in right eye) Does the patient have difficulty concentrating, remembering, or making decisions?: No Patient able to express need for assistance with ADLs?: Yes Does the patient have difficulty dressing or bathing?: Yes Independently performs ADLs?: No Communication: Independent Dressing (OT): Needs assistance Is this a  change from baseline?: Pre-admission baseline Grooming: Independent Feeding: Independent Bathing: Needs assistance Is this a change from baseline?: Pre-admission baseline Toileting: Needs assistance Is this a change from baseline?: Pre-admission baseline In/Out Bed: Needs  assistance Is this a change from baseline?: Pre-admission baseline Walks in Home: Needs assistance Is this a change from baseline?: Pre-admission baseline Does the patient have difficulty walking or climbing stairs?: Yes Weakness of Legs: Both Weakness of Arms/Hands: Both  Permission Sought/Granted Permission sought to share information with : Facility Art therapist granted to share information with : Yes, Verbal Permission Granted     Permission granted to share info w AGENCY: HH        Emotional Assessment Appearance:: Appears stated age Attitude/Demeanor/Rapport: Engaged Affect (typically observed): Accepting,Appropriate Orientation: : Oriented to Self,Oriented to Place,Oriented to  Time,Oriented to Situation Alcohol / Substance Use: Not Applicable Psych Involvement: No (comment)  Admission diagnosis:  Osteomyelitis ([place]) [M86.9] Severe sepsis ([place]) [A41.9, R65.20] Patient Active Problem List   Diagnosis Date Noted  . Osteomyelitis ([place]) 08/25/2020  . Multiple pulmonary nodules determined by computed tomography of lung 08/14/2020  . Community acquired pneumonia of right lower lobe of lung   . Pressure injury of skin 07/23/2020  . Sepsis ([place]) 07/22/2020  . Edema of both legs 12/19/2018  . PVD (peripheral vascular disease) ([place]) 12/19/2018  . DOE (dyspnea on exertion) 11/14/2018  . Chronic respiratory failure with hypoxia ([place]) 04/06/2018  . Decreased pedal pulses 01/17/2017  . Mild aortic valve regurgitation 01/17/2017  . Leg swelling 12/30/2016  . Neuropathy, alcoholic ([place]) 97/06/6376  . COPD III with marked reversibility 12/27/2012   PCP:  [name], MD Pharmacy:   [facility] [place] - [address] [place] [address] Phone: [phone] Fax: [phone]     Social Determinants of Health (SDOH) Interventions    Readmission Risk Interventions No flowsheet data  found.

## 2020-08-29 NOTE — Progress Notes (Signed)
PROGRESS NOTE  Joel Perez FBX:[id number] DOB: 1945-09-30 DOA: 08/25/2020 PCP: [name], MD  HPI/Recap of past 67 hours: 75 year old M with PMH of diastolic CHF, LBBB, PAD, neuropathy, stasis dermatitis, COPD, chronic hypoxic RF on 4 L and obesity presented to ED with worsening right foot wound, and admitted for sepsis due to right fifth MTP joint osteomyelitis and infected right foot ulcer.  He was started on IV vancomycin and Zosyn.  Blood cultures NGTD.  Vascular surgery, Dr. Donnetta Hutching consulted and recommended transfer [name] for arteriogram and further care.  Status post open right fifth toe ray amputation by Dr. Stanford Breed on 08/28/2020.  Gram positive cocci bacteremia from blood cx taken on 08/25/20.  08/29/20: Patient was seen and examined at his bedside.  He reports severe pain at the site of his amputation.  Started on IV Dilaudid as needed for severe pain.    Assessment/Plan: Principal Problem:   Osteomyelitis ([place]) Active Problems:   COPD III with marked reversibility   Chronic respiratory failure with hypoxia (HCC)   PVD (peripheral vascular disease) (HCC)   Sepsis (HCC)   POD #1 post open right fifth toe ray amputation  Presented with severe sepsis from right foot fifth MTP joint osteomyelitis and infected right foot ulcer: POA.   Met criteria with leukocytosis, tachycardia and lactic acidosis.   Blood culture taken on 08/25/2020 + for gram-positive cocci Repeated blood culture on 08/29/2020 in process. History of peripheral artery disease.   Continue broad-spectrum IV antibiotics, currently on cefepime IV Flagyl and IV vancomycin.  -Vascular surgery following, appreciate assistance. -Post amputation, wound VAC application -Local wound care and pain control per vascular surgery. On IV Dilaudid as needed for severe pain Percocet as needed for moderate pain  Gram positive cocci isolated from anaerobic bottle 08/25/20 Currently on IV vancomycin,  cefepime, continue Conitnue to follow blood cultures Repeat blood cultures on 08/29/20  Peripheral artery disease Continue statin Restart antiplatelet when okay with vascular surgery.  Chronic diastolic CHF  Euvolemic. TTE on 07/22/2020 with LVEF of 60 to 65%, mild LVH and indeterminate DD. -Continue home Lasix 20 mg daily -Monitor fluid and renal status while on diuretics Net I&O -2.1 L Continue strict I's and O's and daily weight.  Chronic COPD/chronic hypoxic respiratory failure:  Stable on home 4 L.   Currently at baseline. Continue home prednisone 20 mg   Followed by pulmonology. Wean off home prednisone as it may affect wound healing after surgery Prednisone dose decreased to 10 mg daily starting on 08/30/2020. -Continue home inhalers, albuterol nebulizer as needed.  -Continue supplemental oxygen.  Essential hypertension:  BP is currently stable. -Continue home Lasix and losartan  Neuropathy -Continue home gabapentin.  Class II obesity Body mass index is 35.01 kg/m. Pressure Injury 07/22/20 Buttocks Left Stage 2 -  Partial thickness loss of dermis presenting as a shallow open injury with a red, pink wound bed without slough. (Active)  07/22/20 1600  Location: Buttocks  Location Orientation: Left  Staging: Stage 2 -  Partial thickness loss of dermis presenting as a shallow open injury with a red, pink wound bed without slough.  Wound Description (Comments):   Present on Admission: Yes     Pressure Injury 07/23/20 Heel Right Stage 3 -  Full thickness tissue loss. Subcutaneous fat may be visible but bone, tendon or muscle are NOT exposed. (Active)  07/23/20 0800  Location: Heel  Location Orientation: Right  Staging: Stage 3 -  Full thickness  tissue loss. Subcutaneous fat may be visible but bone, tendon or muscle are NOT exposed.  Wound Description (Comments):   Present on Admission: Yes   DVT prophylaxis:  SCDs Start: 08/25/20 2132  Code Status: Full  code Family Communication: Updated his wife at bedside.  Level of care: Telemetry Medical Status is: Inpatient  Remains inpatient appropriate because:Ongoing diagnostic testing needed not appropriate for outpatient work up, Unsafe d/c plan, IV treatments appropriate due to intensity of illness or inability to take PO and Inpatient level of care appropriate due to severity of illness   Dispo: The patient is from: Home  Anticipated d/c is to: Home with home health services once vascular surgery signs off.  Patient currently is not medically stable to d/c.              Difficult to place patient not applicable.       Consultants:  Vascular surgery, Dr. Donnetta Hutching          Objective: Vitals:   08/28/20 2336 08/29/20 0318 08/29/20 0633 08/29/20 0757  BP: 129/72 (!) 148/74  (!) 157/63  Pulse: 79 84  93  Resp: _0 Temp: 97.6 F (36.4 C) 97.6 F (36.4 C)  98.1 F (36.7 C)  TempSrc: Oral Oral  Oral  SpO2: 98% 97%  95%  Weight:   105.7 kg   Height:        Intake/Output Summary (Last 24 hours) at 08/29/2020 0804 Last data filed at 08/29/2020 6546 Gross per 24 hour  Intake 1280 ml  Output 1778 ml  Net -498 ml   Filed Weights   08/26/20 1959 08/28/20 1147 08/29/20 5035  Weight: 111.1 kg 111 kg 105.7 kg    Exam:  . General: 75 y.o. year-old male chronically ill-appearing no acute distress.  He is alert oriented x3.  Very hard of hearing. . Cardiovascular: Regular rate and rhythm no rubs or rales.   Marland Kitchen Respiratory: Clear to auscultation no wheezes no rales. . Abdomen: Soft nontender normal bowel sounds present.  Musculoskeletal: Trace lower extremity edema bilaterally.  Right foot in a surgical dressing. . Skin: Right foot in surgical dressing. Marland Kitchen Psychiatry: Mood is appropriate for condition and setting.   Data Reviewed: CBC: Recent Labs  Lab 08/25/20 1619 08/26/20 0344 08/27/20 0059 08/27/20 2113 08/28/20 0129 08/29/20 0208   WBC 15.8* 11.3* 10.8* 11.9* 12.0* 10.1  NEUTROABS 14.3*  --   --   --  8.7*  --   HGB 11.4* 10.0* 10.3* 10.2* 10.7* 10.3*  HCT 36.6* 32.2* 32.6* 32.5* 34.6* 32.6*  MCV 96.1 96.1 95.9 95.6 95.3 93.7  PLT 494* 431* 404* 475* 444* 465*   Basic Metabolic Panel: Recent Labs  Lab 08/25/20 1619 08/26/20 0344 08/26/20 1454 08/27/20 0059 08/27/20 2113 08/28/20 0129 08/29/20 0208  NA 137  --  141 138  --  136 138  K 4.4  --  4.0 4.1  --  3.8 4.2  CL 101  --  106 106  --  104 104  CO2 27  --  28 27  --  26 28  GLUCOSE 118*  --  160* 143*  --  76 110*  BUN 13  --  11 10  --  9 12  CREATININE 1.03   < > 1.02 0.97 0.91 0.79 0.83  CALCIUM 8.8*  --  8.6* 8.3*  --  8.3* 8.5*  MG  --   --   --  2.3  --   --  2.1  PHOS  --   --  1.8* 3.9  --   --  3.2   < > = values in this interval not displayed.   GFR: Estimated Creatinine Clearance: 90.8 mL/min (by C-G formula based on SCr of 0.83 mg/dL). Liver Function Tests: Recent Labs  Lab 08/25/20 1619 08/26/20 1454 08/27/20 0059 08/29/20 0208  AST 27  --   --   --   ALT 21  --   --   --   ALKPHOS 64  --   --   --   BILITOT 0.7  --   --   --   PROT 7.8  --   --   --   ALBUMIN 3.4* 2.8* 2.5* 2.6*   No results for input(s): LIPASE, AMYLASE in the last 168 hours. No results for input(s): AMMONIA in the last 168 hours. Coagulation Profile: No results for input(s): INR, PROTIME in the last 168 hours. Cardiac Enzymes: No results for input(s): CKTOTAL, CKMB, CKMBINDEX, TROPONINI in the last 168 hours. BNP (last 3 results) No results for input(s): PROBNP in the last 8760 hours. HbA1C: Recent Labs    08/26/20 1454  HGBA1C 6.3*   CBG: No results for input(s): GLUCAP in the last 168 hours. Lipid Profile: Recent Labs    08/27/20 0059  CHOL 129  HDL 37*  LDLCALC 74  TRIG 90  CHOLHDL 3.5   Thyroid Function Tests: No results for input(s): TSH, T4TOTAL, FREET4, T3FREE, THYROIDAB in the last 72 hours. Anemia Panel: No results for  input(s): VITAMINB12, FOLATE, FERRITIN, TIBC, IRON, RETICCTPCT in the last 72 hours. Urine analysis:    Component Value Date/Time   COLORURINE YELLOW 08/25/2020 1620   APPEARANCEUR CLEAR 08/25/2020 1620   LABSPEC 1.008 08/25/2020 1620   PHURINE 8.0 08/25/2020 1620   GLUCOSEU NEGATIVE 08/25/2020 1620   HGBUR SMALL (A) 08/25/2020 1620   BILIRUBINUR NEGATIVE 08/25/2020 1620   KETONESUR NEGATIVE 08/25/2020 1620   PROTEINUR NEGATIVE 08/25/2020 1620   NITRITE NEGATIVE 08/25/2020 1620   LEUKOCYTESUR NEGATIVE 08/25/2020 1620   Sepsis Labs: _0 (procalcitonin:4,lacticidven:4)  ) Recent Results (from the past 240 hour(s))  Blood culture (routine x 2)     Status: None (Preliminary result)   Collection Time: 08/25/20  5:15 PM   Specimen: BLOOD  Result Value Ref Range Status   Specimen Description   Final    BLOOD LEFT ANTECUBITAL Performed at [facility], [place] [address], [place], [place] [address]    Special Requests   Final    BOTTLES DRAWN AEROBIC ONLY Blood Culture results may not be optimal due to an inadequate volume of blood received in culture bottles Performed at [facility] [address] [place], [place] [address]    Culture   Final    NO GROWTH 3 DAYS Performed at [facility], [place] [address], [place], [place] [address]    Report Status PENDING  Incomplete  Blood culture (routine x 2)     Status: None (Preliminary result)   Collection Time: 08/25/20  5:26 PM   Specimen: BLOOD  Result Value Ref Range Status   Specimen Description   Final    BLOOD RIGHT ANTECUBITAL Performed at [facility] [address], [place], [place] [address]    Special Requests   Final    BOTTLES DRAWN AEROBIC AND ANAEROBIC Blood Culture adequate volume Performed at [facility] [address] [place], [place] [address]    Culture  Setup Time   Final    GRAM POSITIVE COCCI ANAEROBIC BOTTLE  ONLY CRITICAL RESULT CALLED TO, READ BACK BY AND VERIFIED WITH: Limmie Patricia [id number] FCP Performed at [facility], [place] [address] [place], [place] [address]    Culture GRAM POSITIVE COCCI  Final   Report Status PENDING  Incomplete  Aerobic Culture w Gram Stain (superficial specimen)     Status: None (Preliminary result)   Collection Time: 08/25/20  5:26 PM   Specimen: Foot  Result Value Ref Range Status   Specimen Description   Final    FOOT RIGHT Performed at [facility] [address], [place], [place] [address]    Special Requests   Final    NONE Performed at [facility], [place] [address] [place], [place] [address]    Gram Stain   Final    FEW WBC PRESENT, PREDOMINANTLY PMN ABUNDANT GRAM POSITIVE COCCI IN PAIRS IN CLUSTERS ABUNDANT GRAM NEGATIVE RODS FEW GRAM POSITIVE RODS    Culture   Final    ABUNDANT GROUP B STREP(S.AGALACTIAE)ISOLATED TESTING AGAINST S. AGALACTIAE NOT ROUTINELY PERFORMED DUE TO PREDICTABILITY OF AMP/PEN/VAN SUSCEPTIBILITY. FEW STAPHYLOCOCCUS AUREUS SUSCEPTIBILITIES TO FOLLOW Performed at [facility], [place] [address], [place], [place] [address]    Report Status PENDING  Incomplete  Blood Culture ID Panel (Reflexed)     Status: None   Collection Time: 08/25/20  5:26 PM  Result Value Ref Range Status   Enterococcus faecalis NOT DETECTED NOT DETECTED Final   Enterococcus Faecium NOT DETECTED NOT DETECTED Final   Listeria monocytogenes NOT DETECTED NOT DETECTED Final   Staphylococcus species NOT DETECTED NOT DETECTED Final   Staphylococcus aureus (BCID) NOT DETECTED NOT DETECTED Final   Staphylococcus epidermidis NOT DETECTED NOT DETECTED Final   Staphylococcus lugdunensis NOT DETECTED NOT DETECTED Final   Streptococcus species NOT DETECTED NOT DETECTED Final   Streptococcus agalactiae NOT DETECTED NOT DETECTED Final   Streptococcus pneumoniae NOT DETECTED NOT DETECTED Final   Streptococcus pyogenes  NOT DETECTED NOT DETECTED Final   A.calcoaceticus-baumannii NOT DETECTED NOT DETECTED Final   Bacteroides fragilis NOT DETECTED NOT DETECTED Final   Enterobacterales NOT DETECTED NOT DETECTED Final   Enterobacter cloacae complex NOT DETECTED NOT DETECTED Final   Escherichia coli NOT DETECTED NOT DETECTED Final   Klebsiella aerogenes NOT DETECTED NOT DETECTED Final   Klebsiella oxytoca NOT DETECTED NOT DETECTED Final   Klebsiella pneumoniae NOT DETECTED NOT DETECTED Final   Proteus species NOT DETECTED NOT DETECTED Final   Salmonella species NOT DETECTED NOT DETECTED Final   Serratia marcescens NOT DETECTED NOT DETECTED Final   Haemophilus influenzae NOT DETECTED NOT DETECTED Final   Neisseria meningitidis NOT DETECTED NOT DETECTED Final   Pseudomonas aeruginosa NOT DETECTED NOT DETECTED Final   Stenotrophomonas maltophilia NOT DETECTED NOT DETECTED Final   Candida albicans NOT DETECTED NOT DETECTED Final   Candida auris NOT DETECTED NOT DETECTED Final   Candida glabrata NOT DETECTED NOT DETECTED Final   Candida krusei NOT DETECTED NOT DETECTED Final   Candida parapsilosis NOT DETECTED NOT DETECTED Final   Candida tropicalis NOT DETECTED NOT DETECTED Final   Cryptococcus neoformans/gattii NOT DETECTED NOT DETECTED Final    Comment: Performed at [facility], [address] [place], [place] [address]  Resp Panel by RT-PCR (Flu A&B, Covid) Nasopharyngeal Swab     Status: None   Collection Time: 08/25/20  6:43 PM   Specimen: Nasopharyngeal Swab; Nasopharyngeal(NP)  swabs in vial transport medium  Result Value Ref Range Status   SARS Coronavirus 2 by RT PCR NEGATIVE NEGATIVE Final    Comment: (NOTE) SARS-CoV-2 target nucleic acids are NOT DETECTED.  The SARS-CoV-2 RNA is generally detectable in upper respiratory specimens during the acute phase of infection. The lowest concentration of SARS-CoV-2 viral copies this assay can detect is 138 copies/mL. A negative result does not  preclude SARS-Cov-2 infection and should not be used as the sole basis for treatment or other patient management decisions. A negative result may occur with  improper specimen collection/handling, submission of specimen other than nasopharyngeal swab, presence of viral mutation(s) within the areas targeted by this assay, and inadequate number of viral copies(<138 copies/mL). A negative result must be combined with clinical observations, patient history, and epidemiological information. The expected result is Negative.  Fact Sheet for Patients:  EntrepreneurPulse.com.au  Fact Sheet for Healthcare Providers:  IncredibleEmployment.be  This test is no t yet approved or cleared by the [place] FDA and  has been authorized for detection and/or diagnosis of SARS-CoV-2 by FDA under an Emergency Use Authorization (EUA). This EUA will remain  in effect (meaning this test can be used) for the duration of the COVID-19 declaration under Section 564(b)(1) of the Act, 21 U.S.C.section 360bbb-3(b)(1), unless the authorization is terminated  or revoked sooner.       Influenza A by PCR NEGATIVE NEGATIVE Final   Influenza B by PCR NEGATIVE NEGATIVE Final    Comment: (NOTE) The Xpert Xpress SARS-CoV-2/FLU/RSV plus assay is intended as an aid in the diagnosis of influenza from Nasopharyngeal swab specimens and should not be used as a sole basis for treatment. Nasal washings and aspirates are unacceptable for Xpert Xpress SARS-CoV-2/FLU/RSV testing.  Fact Sheet for Patients: EntrepreneurPulse.com.au  Fact Sheet for Healthcare Providers: IncredibleEmployment.be  This test is not yet approved or cleared by the [place] FDA and has been authorized for detection and/or diagnosis of SARS-CoV-2 by FDA under an Emergency Use Authorization (EUA). This EUA will remain in effect (meaning this test can be used) for the  duration of the COVID-19 declaration under Section 564(b)(1) of the Act, 21 U.S.C. section 360bbb-3(b)(1), unless the authorization is terminated or revoked.  Performed at [facility], [place] [address], [place], [place] [address]   Surgical PCR screen     Status: Abnormal   Collection Time: 08/28/20 10:47 AM   Specimen: Nasal Mucosa; Nasal Swab  Result Value Ref Range Status   MRSA, PCR NEGATIVE NEGATIVE Final   Staphylococcus aureus POSITIVE (A) NEGATIVE Final    Comment: (NOTE) The Xpert SA Assay (FDA approved for NASAL specimens in patients 55 years of age and older), is one component of a comprehensive surveillance program. It is not intended to diagnose infection nor to guide or monitor treatment. Performed at [facility], [place] [address], [place], [place] [address]   Aerobic/Anaerobic Culture w Gram Stain (surgical/deep wound)     Status: None (Preliminary result)   Collection Time: 08/28/20  1:10 PM   Specimen: Toe, Right; Amputation  Result Value Ref Range Status   Specimen Description TOE  Final   Special Requests RIGHT FIFTH TOE  Final   Gram Stain   Final    ABUNDANT WBC PRESENT,BOTH PMN AND MONONUCLEAR RARE GRAM POSITIVE COCCI Performed at [facility], [place] [address] [place], [place] [address]    Culture PENDING  Incomplete   Report Status PENDING  Incomplete  Studies: No results found.  Scheduled Meds: . aspirin EC  81 mg Oral Daily  . atorvastatin  40 mg Oral Daily  . fluticasone furoate-vilanterol  1 puff Inhalation Daily  . furosemide  20 mg Oral Daily  . gabapentin  600 mg Oral Q8H  . heparin  5,000 Units Subcutaneous Q8H  . losartan  50 mg Oral Daily  . mupirocin ointment  1 application Nasal BID  . predniSONE  20 mg Oral Q breakfast  . sodium chloride flush  3 mL Intravenous Q12H  . umeclidinium bromide  1 puff Inhalation Daily    Continuous Infusions: . sodium chloride    . ceFEPime (MAXIPIME) IV 2 g  (08/29/20 0159)  . lactated ringers 10 mL/hr at 08/28/20 1148  . metronidazole 500 mg (08/29/20 0620)  . vancomycin 1,500 mg (08/27/20 1251)     LOS: 4 days     Kayleen Memos, MD [facility] Pager [phone]  If 7PM-7AM, please contact night-coverage [email or web address] Password [facility] 08/29/2020, 8:04 AM

## 2020-08-29 NOTE — Progress Notes (Addendum)
  Progress Note    08/29/2020 7:23 AM 1 Day Post-Op  Subjective:  C/o pain throughout the night.  Says he didn't have pain medicine ordered.   Afebrile   Vitals:   08/28/20 2336 08/29/20 0318  BP: 129/72 (!) 148/74  Pulse: 79 84  Resp: 16 14  Temp: 97.6 F (36.4 C) 97.6 F (36.4 C)  SpO2: 98% 97%    Physical Exam: General:  No distress Lungs:  Non labored Incisions:       CBC    Component Value Date/Time   WBC 10.1 08/29/2020 0208   RBC 3.48 (L) 08/29/2020 0208   HGB 10.3 (L) 08/29/2020 0208   HCT 32.6 (L) 08/29/2020 0208   PLT 471 (H) 08/29/2020 0208   MCV 93.7 08/29/2020 0208   MCH 29.6 08/29/2020 0208   MCHC 31.6 08/29/2020 0208   RDW 14.1 08/29/2020 0208   LYMPHSABS 2.0 08/28/2020 0129   MONOABS 0.9 08/28/2020 0129   EOSABS 0.1 08/28/2020 0129   BASOSABS 0.0 08/28/2020 0129    BMET    Component Value Date/Time   NA 138 08/29/2020 0208   NA 140 01/04/2019 1451   K 4.2 08/29/2020 0208   CL 104 08/29/2020 0208   CO2 28 08/29/2020 0208   GLUCOSE 110 (H) 08/29/2020 0208   BUN 12 08/29/2020 0208   BUN 12 01/04/2019 1451   CREATININE 0.83 08/29/2020 0208   CALCIUM 8.5 (L) 08/29/2020 0208   GFRNONAA >60 08/29/2020 0208   GFRAA 91 01/04/2019 1451    INR No results found for: INR   Intake/Output Summary (Last 24 hours) at 08/29/2020 0723 Last data filed at 08/29/2020 3704 Gross per 24 hour  Intake 1280 ml  Output 1778 ml  Net -498 ml     Assessment:  75 y.o. male is s/p:  Open right fifth toe ray amputation  1 Day Post-Op  Plan: -pt doing well this morning.  Dressing taken down and there is mild skin oozing.  Pressure held for about 5 minutes and wet to dry dressing placed back on wound with moderate ace pressure.    -pain not controlled overnight.  Discontinue tramadol, add percocet 1-2 q6h prn pain and increase morphine to 2mg  q2h prn. -ok to ambulate with darco shoe -continue wet to dry dressings for now    Leontine Locket,  PA-C [facility] [phone] 08/29/2020 7:23 AM   VASCULAR STAFF ADDENDUM: I have independently interviewed and examined the patient. I agree with the above.  Good bleeding at operation. Bleed through wound VAC last night. Wet-to-dry with much better hemostasis today. Continue wet-to-dry in short term. Ultimately will transition to wound VAC. Following along.  [name]. Stanford Breed, MD [facility] Phone Number: [phone] 08/29/2020 2:57 PM

## 2020-08-29 NOTE — Care Management Important Message (Signed)
Important Message  Patient Details  Name: KALIB BHAGAT MRN: [id number] Date of Birth: Apr 17, 1946   Medicare Important Message Given:  Yes     Shelda Altes 08/29/2020, 11:29 AM

## 2020-08-29 NOTE — Progress Notes (Signed)
Mobility Specialist - Progress Note   08/29/20 1525  Mobility  Activity Ambulated in room  Level of Assistance Contact guard assist, steadying assist  Assistive Device Front wheel walker  Distance Ambulated (ft) 12 ft  Mobility Response Tolerated well  Mobility performed by Mobility specialist  $Mobility charge 1 Mobility   Pt asx throughout ambulation. He walked from his bed to the window and back twice. Pt back in bed after. VSS throughout.   Pricilla Handler Mobility Specialist Mobility Specialist Phone: [phone]

## 2020-08-29 NOTE — Evaluation (Signed)
Physical Therapy Evaluation Patient Details Name: Joel Perez MRN: [id number] DOB: 1945/08/28 Today's Date: 08/29/2020   History of Present Illness  pt is a 75 y/o male admitted 4/4 with worsening R foot wound.  Found severe sepsis from R foo fifth MTP jt osteomyelitis and infected R foot ulcer. s/p open right fifth toe amputation.  PMHx:  neuropathy, HTN, COPD, blindness R eye  Clinical Impression  Pt admitted with/for surgical management on R fifth toe osteo with amputation.  Pt needing min to min guard assist for basic mobility and gait due to pain and DARCO shoe increasing instability..  Pt currently limited functionally due to the problems listed below.  (see problems list.)  Pt will benefit from PT to maximize function and safety to be able to get home safely with available assist .      Follow Up Recommendations Home health PT;Other (comment) (May not need HHPT by time of d/c)    Equipment Recommendations  None recommended by PT    Recommendations for Other Services       Precautions / Restrictions Precautions Precautions: Fall Required Braces or Orthoses: Other Brace Other Brace: DARCO Shoe Restrictions Weight Bearing Restrictions: Yes RLE Weight Bearing: Weight bearing as tolerated      Mobility  Bed Mobility Overal bed mobility: Needs Assistance Bed Mobility: Supine to Sit     Supine to sit: Supervision     General bed mobility comments: minimal HOB raised, no rail, no assist    Transfers Overall transfer level: Needs assistance   Transfers: Sit to/from Stand Sit to Stand: Min guard         General transfer comment: cues for hand placement/safety, guard for safety and mild instability caused by [facility]  Ambulation/Gait Ambulation/Gait assistance: Min guard;Min assist Gait Distance (Feet): 4 Feet (forward and back) Assistive device: Rolling walker (2 wheeled) Gait Pattern/deviations: Step-to pattern   Gait velocity interpretation: <1.31 ft/sec,  indicative of household ambulator General Gait Details: antalgic and mildly unsteady with [facility] and pt having some problems keeping w/bearing on the heel.  Needing RW and minimal stability assist at times.  Stairs            Wheelchair Mobility    Modified Rankin (Stroke Patients Only)       Balance Overall balance assessment: Needs assistance Sitting-balance support: No upper extremity supported Sitting balance-Leahy Scale: Fair     Standing balance support: Single extremity supported;Bilateral upper extremity supported Standing balance-Leahy Scale: Poor Standing balance comment: reliant on AD due to pain and darco shoe.                             Pertinent Vitals/Pain Pain Assessment: Faces Faces Pain Scale: Hurts little more Pain Location: R foot Pain Descriptors / Indicators: Discomfort;Guarding;Sore Pain Intervention(s): Limited activity within patient's tolerance;Premedicated before session    [place] expects to be discharged to:: Private residence Living Arrangements: Spouse/significant other Available Help at Discharge: Family;Available 24 hours/day Type of Home: Mobile home Home Access: Ramped entrance     Home Layout: One level Home Equipment: [place] - 2 wheels;Walker - standard;Shower seat - built in;Grab bars - tub/shower;Grab bars - toilet      Prior Function Level of Independence: Independent with assistive device(s)         Comments: Household ambulator using standard walker, lately walking on shorter distance of 20-30 feet:, was community ambulator driving 2 months ago, on home O2 4  LPM constant     Hand Dominance        Extremity/Trunk Assessment   Upper Extremity Assessment Upper Extremity Assessment: Defer to OT evaluation    Lower Extremity Assessment Lower Extremity Assessment: Overall WFL for tasks assessed;RLE deficits/detail RLE Deficits / Details: moved against gravity with assist        Communication   Communication: No difficulties  Cognition Arousal/Alertness: Awake/alert Behavior During Therapy: WFL for tasks assessed/performed Overall Cognitive Status: Within Functional Limits for tasks assessed                                        General Comments General comments (skin integrity, edema, etc.): SpO2 on 4L at 93% with EHR at 111 bpm during limited activity today.    Exercises     Assessment/Plan    PT Assessment Patient needs continued PT services  PT Problem List Decreased strength;Decreased activity tolerance;Decreased balance;Decreased mobility;Decreased knowledge of use of DME;Cardiopulmonary status limiting activity;Pain       PT Treatment Interventions DME instruction;Gait training;Functional mobility training;Therapeutic activities;Balance training;Patient/family education    PT Goals (Current goals can be found in the Care Plan section)  Acute Rehab PT Goals Patient Stated Goal: Be able to get around the house and outside down the ramp. PT Goal Formulation: With patient Time For Goal Achievement: 09/12/20 Potential to Achieve Goals: Good    Frequency Min 3X/week   Barriers to discharge        Co-evaluation               AM-PAC PT "6 Clicks" Mobility  Outcome Measure Help needed turning from your back to your side while in a flat bed without using bedrails?: A Little Help needed moving from lying on your back to sitting on the side of a flat bed without using bedrails?: A Little Help needed moving to and from a bed to a chair (including a wheelchair)?: A Little Help needed standing up from a chair using your arms (e.g., wheelchair or bedside chair)?: A Little Help needed to walk in hospital room?: A Little Help needed climbing 3-5 steps with a railing? : A Lot 6 Click Score: 17    End of Session Equipment Utilized During Treatment: Oxygen Activity Tolerance: Patient tolerated treatment well;Patient limited by  pain Patient left: in chair;with call bell/phone within reach;with family/visitor present Nurse Communication: Mobility status PT Visit Diagnosis: Unsteadiness on feet (R26.81);Other abnormalities of gait and mobility (R26.89);Pain Pain - Right/Left: Right Pain - part of body:  (foot)    Time: 8657-8469 PT Time Calculation (min) (ACUTE ONLY): 24 min   Charges:   PT Evaluation $PT Eval Moderate Complexity: 1 Mod PT Treatments $Gait Training: 8-22 mins        08/29/2020  Ginger Carne., PT Acute Rehabilitation Services [phone]  (pager) [phone]  (office)  Tessie Fass Aybree Lanyon 08/29/2020, 12:14 PM

## 2020-08-29 NOTE — Progress Notes (Signed)
Pharmacy Antibiotic Note  Joel Perez is a 75 y.o. male admitted on 08/25/2020 with history of COPD with chronic hypoxemia on 4 L O2, chronic diastolic CHF, IVCD, chronic venous insufficiency with stasis dermatitis, PAD, neuropathy, recent hospitalization for pneumonia 1 month ago presents to the ED from home for evaluation of worsening right foot wound for the last 2 days  Pharmacy has been consulted for vancomycin and cefepime dosing.  Patient currently afebrile, wbc stable downtrending to 10 today, renal function normal. Noted group B strep and a few MSSA on wound culture.   Vancomycin trough was just below goal at 12 today. Will increase to 750mg  IV q12 hours for new expected trough of 16.   Plan:  Continue Cefepime 2g q8 hours Metronidazole 500mg  q8 hours Vancomycin to 750mg  IV X79 hours Follow up culture data  Height: 5\' 10"  (177.8 cm) Weight: 105.7 kg (233 lb 0.4 oz) IBW/kg (Calculated) : 73  Temp (24hrs), Avg:97.8 F (36.6 C), Min:97.5 F (36.4 C), Max:98.1 F (36.7 C)  Recent Labs  Lab 08/25/20 1619 08/25/20 1746 08/26/20 0344 08/26/20 1454 08/27/20 0059 08/27/20 2113 08/28/20 0129 08/29/20 0208  WBC 15.8*  --  11.3*  --  10.8* 11.9* 12.0* 10.1  CREATININE 1.03  --  0.80 1.02 0.97 0.91 0.79 0.83  LATICACIDVEN 2.6* 1.9  --   --   --   --   --   --     Estimated Creatinine Clearance: 90.8 mL/min (by C-G formula based on SCr of 0.83 mg/dL).    Allergies  Allergen Reactions  . Other Swelling and Other (See Comments)    Farmed Fish (tightness in throat & lip swelling)  . Codeine Rash  . Sulfa Antibiotics Hives    Antimicrobials this admission: 4/4 vanc >> 4/4 zosyn >> 4/5 4/5 Metronidazole >>  4/5 cefepime >>  Microbiology results: 4/4BCx: GPC, BCID with NO detection 4/4 foot culture: abundant GPC in pairs and clusters, abundant GNR, few GPR>few staph, GBS 4/7 toe - GPC 4/8 bld: in process  Thank you for allowing pharmacy to be a part of this patient's  care.  Erin Hearing PharmD., BCPS Clinical Pharmacist 08/29/2020 10:58 AM

## 2020-08-29 NOTE — Evaluation (Signed)
Occupational Therapy Evaluation Patient Details Name: Joel Perez MRN: [id number] DOB: 04-Sep-1945 Today's Date: 08/29/2020    History of Present Illness pt is a 75 y/o male admitted 4/4 with worsening R foot wound.  Found severe sepsis from R foo fifth MTP jt osteomyelitis and infected R foot ulcer. s/p open right fifth toe amputation.  PMHx:  neuropathy, HTN, COPD, blindness R eye   Clinical Impression   Patient admitted for the diagnosis and procedure above.  PTA he was limited to walking household distances with a standard RW at Mod I level.  His wife assisted with meals, meds, home management, community mobility, and lower body ADL seated sink side.  Deficits are listed below.  Currently he is needing up [facility] for basic transfers, and up to [place] for lower body ADL.  Given level of support he has at home, returning home when medically cleared is recommended.  No post acute OT is identified at this time.      Follow Up Recommendations  No OT follow up    Equipment Recommendations  None recommended by OT    Recommendations for Other Services       Precautions / Restrictions Precautions Precautions: Fall Required Braces or Orthoses: Other Brace Other Brace: DARCO Shoe Restrictions Weight Bearing Restrictions: Yes RLE Weight Bearing: Weight bearing as tolerated      Mobility Bed Mobility Overal bed mobility: Needs Assistance Bed Mobility: Sit to Supine     Supine to sit: Supervision     General bed mobility comments: minimal HOB raised, no rail, no assist Patient Response: Cooperative  Transfers Overall transfer level: Needs assistance   Transfers: Sit to/from Stand;Stand Pivot Transfers Sit to Stand: Min guard Stand pivot transfers: Min guard       General transfer comment: cues for hand placement/safety, guard for safety and mild instability caused by [facility]    Balance Overall balance assessment: Needs assistance Sitting-balance support: No upper  extremity supported Sitting balance-Leahy Scale: Good     Standing balance support: Single extremity supported Standing balance-Leahy Scale: Poor Standing balance comment: reliant on AD due to pain and darco shoe.                           ADL either performed or assessed with clinical judgement   ADL Overall ADL's : Needs assistance/impaired Eating/Feeding: Independent;Sitting   Grooming: Set up;Sitting   Upper Body Bathing: Set up;Sitting   Lower Body Bathing: Moderate assistance;Sit to/from stand   Upper Body Dressing : Set up;Sitting   Lower Body Dressing: Moderate assistance;Sit to/from stand                 General ADL Comments: limited to stand pivot transfers due to dizziness and difficulty walking with darco shoe     Vision Baseline Vision/History: Wears glasses Wears Glasses: At all times Patient Visual Report: No change from baseline       Perception     Praxis      Pertinent Vitals/Pain Pain Assessment: Faces Faces Pain Scale: Hurts a little bit Pain Location: R foot Pain Descriptors / Indicators: Discomfort;Guarding;Sore Pain Intervention(s): Monitored during session     Hand Dominance Right   Extremity/Trunk Assessment Upper Extremity Assessment Upper Extremity Assessment: Overall WFL for tasks assessed   Lower Extremity Assessment Lower Extremity Assessment: Defer to PT evaluation    Cervical / Trunk Assessment Cervical / Trunk Assessment: Normal   Communication Communication Communication: No difficulties  Cognition Arousal/Alertness: Awake/alert Behavior During Therapy: WFL for tasks assessed/performed Overall Cognitive Status: Within Functional Limits for tasks assessed                                     General Comments  SpO2 on 4L at 93%, dizziness with standing.      Exercises     Shoulder Instructions      Home Living Family/patient expects to be discharged to:: Private  residence Living Arrangements: Spouse/significant other Available Help at Discharge: Family;Available 24 hours/day Type of Home: Mobile home Home Access: Ramped entrance     Home Layout: One level     Bathroom Shower/Tub: Occupational psychologist: Handicapped height Bathroom Accessibility: Yes   Home Equipment: Environmental consultant - 2 wheels;Walker - standard;Shower seat - built in;Grab bars - tub/shower;Grab bars - toilet          Prior Functioning/Environment Level of Independence: Needs assistance  Gait / Transfers Assistance Needed: standard RW ADL's / Homemaking Assistance Needed: spouse assists with LB bathing and dressing if needed.  Wife completes all meal prep and community mobility.  Patient has not been driving recently. Communication / Swallowing Assistance Needed: WFL Comments: Household ambulator using standard walker, lately walking on shorter distance of 20-30 feet:, was community ambulator driving 2 months ago, on home O2 4 LPM constant        OT Problem List: Decreased activity tolerance;Impaired balance (sitting and/or standing);Pain;Increased edema      OT Treatment/Interventions: Self-care/ADL training;DME and/or AE instruction;Balance training;Therapeutic activities    OT Goals(Current goals can be found in the care plan section) Acute Rehab OT Goals Patient Stated Goal: Be able to get around the house and outside down the ramp. OT Goal Formulation: With patient Time For Goal Achievement: 09/12/20 Potential to Achieve Goals: Good ADL Goals Pt Will Perform Lower Body Bathing: with set-up;sit to/from stand Pt Will Perform Lower Body Dressing: with set-up;sit to/from stand Pt Will Transfer to Toilet: with modified independence;ambulating;regular height toilet Pt Will Perform Toileting - Clothing Manipulation and hygiene: with modified independence;sit to/from stand  OT Frequency: Min 2X/week   Barriers to D/C:    none noted       Co-evaluation               AM-PAC OT "6 Clicks" Daily Activity     Outcome Measure Help from another person eating meals?: None Help from another person taking care of personal grooming?: None Help from another person toileting, which includes using toliet, bedpan, or urinal?: A Little Help from another person bathing (including washing, rinsing, drying)?: A Little Help from another person to put on and taking off regular upper body clothing?: None Help from another person to put on and taking off regular lower body clothing?: A Little 6 Click Score: 21   End of Session Equipment Utilized During Treatment: Oxygen Nurse Communication: Mobility status  Activity Tolerance: Patient limited by pain Patient left: in bed;with call bell/phone within reach;with family/visitor present  OT Visit Diagnosis: Unsteadiness on feet (R26.81);Pain Pain - Right/Left: Right Pain - part of body: Ankle and joints of foot                Time: 3532-9924 OT Time Calculation (min): 14 min Charges:  OT General Charges $OT Visit: 1 Visit OT Evaluation $OT Eval Moderate Complexity: 1 Mod  08/29/2020  Rich, OTR/L  Acute Rehabilitation Services  Office:  [place] 08/29/2020, 1:54 PM

## 2020-08-30 LAB — CBC
HCT: 32.2 % — ABNORMAL LOW (ref 39.0–52.0)
Hemoglobin: 9.9 g/dL — ABNORMAL LOW (ref 13.0–17.0)
MCH: 29.2 pg (ref 26.0–34.0)
MCHC: 30.7 g/dL (ref 30.0–36.0)
MCV: 95 fL (ref 80.0–100.0)
Platelets: 452 10*3/uL — ABNORMAL HIGH (ref 150–400)
RBC: 3.39 MIL/uL — ABNORMAL LOW (ref 4.22–5.81)
RDW: 14.3 % (ref 11.5–15.5)
WBC: 11.6 10*3/uL — ABNORMAL HIGH (ref 4.0–10.5)
nRBC: 0 % (ref 0.0–0.2)

## 2020-08-30 LAB — CULTURE, BLOOD (ROUTINE X 2): Culture: NO GROWTH

## 2020-08-30 LAB — BASIC METABOLIC PANEL
Anion gap: 4 — ABNORMAL LOW (ref 5–15)
BUN: 11 mg/dL (ref 8–23)
CO2: 30 mmol/L (ref 22–32)
Calcium: 8.5 mg/dL — ABNORMAL LOW (ref 8.9–10.3)
Chloride: 106 mmol/L (ref 98–111)
Creatinine, Ser: 0.87 mg/dL (ref 0.61–1.24)
GFR, Estimated: >60 mL/min (ref >60)
Glucose, Bld: 85 mg/dL (ref 70–99)
Potassium: 3.8 mmol/L (ref 3.5–5.1)
Sodium: 140 mmol/L (ref 135–145)

## 2020-08-30 MED ORDER — LOPERAMIDE HCL 2 MG PO CAPS: 2.0000 mg | ORAL_CAPSULE | ORAL | Status: DC | PRN

## 2020-08-30 MED ORDER — SACCHAROMYCES BOULARDII 250 MG PO CAPS
250.0000 mg | ORAL_CAPSULE | Freq: Two times a day (BID) | ORAL | Status: DC
  Administered 2020-08-30 – 2020-09-03 (×8): 250 mg via ORAL
  Filled 2020-08-30 (×8): qty 1

## 2020-08-30 NOTE — Progress Notes (Signed)
PROGRESS NOTE  Joel Perez YQI:[id number] DOB: 02-May-1946 DOA: 08/25/2020 PCP: [name], MD  HPI/Recap of past 65 hours: 75 year old M with PMH of diastolic CHF, LBBB, PAD, neuropathy, stasis dermatitis, COPD, chronic hypoxic RF on 4 L and obesity presented to ED with worsening right foot wound, and admitted for sepsis due to right fifth MTP joint osteomyelitis and infected right foot ulcer.  He was started on IV vancomycin and Zosyn.  Blood cultures NGTD.  Vascular surgery, Dr. Donnetta Hutching consulted and recommended transfer from [name] to [facility] for arteriogram and further care.  Status post open right fifth toe ray amputation by Dr. Stanford Breed on 08/28/2020.  Gram positive cocci bacteremia from blood cx taken on 08/25/20.  08/30/20: Patient was seen and examined with his wife at bedside.  His right foot pain is better controlled today.  Reports watery stools.  Starting [employer], probiotic while on IV antibiotics.    Assessment/Plan: Principal Problem:   Osteomyelitis ([place]) Active Problems:   COPD III with marked reversibility   Chronic respiratory failure with hypoxia (HCC)   PVD (peripheral vascular disease) (HCC)   Sepsis (HCC)   POD #2 post open right fifth toe ray amputation  Presented with severe sepsis from right foot fifth MTP joint osteomyelitis and infected right foot ulcer: POA.   Met criteria with leukocytosis, tachycardia and lactic acidosis.   Blood culture taken on 08/25/2020 + for gram-positive cocci Repeated blood culture on 08/29/2020 in process. History of peripheral artery disease.   Continue broad-spectrum IV antibiotics, currently on cefepime IV Flagyl and IV vancomycin.  -Vascular surgery following, appreciate assistance. -Post amputation, wound VAC application -Local wound care and pain control per vascular surgery. On IV Dilaudid as needed for severe pain Percocet as needed for moderate pain Continue pain control  Gram positive cocci isolated from  anaerobic bottle 08/25/20 Currently on IV vancomycin, cefepime, continue Conitnue to follow blood cultures Repeat blood cultures on 08/29/20  Chronic intermittent loose stools, unclear etiology Self reported intermittent loose stools since December 2021 Start Florastor probiotics 220 mg twice daily Afebrile, less likely infective Imodium as needed  Peripheral artery disease Continue statin Restart antiplatelet when okay with vascular surgery.  Chronic diastolic CHF  Euvolemic. TTE on 07/22/2020 with LVEF of 60 to 65%, mild LVH and indeterminate DD. -Continue home Lasix 20 mg daily -Monitor fluid and renal status while on diuretics Net I&O -2.1 L Continue strict I's and O's and daily weight.  Chronic COPD/chronic hypoxic respiratory failure:  Stable on home 4 L.   Currently at baseline. Continue home prednisone 20 mg   Followed by pulmonology. Wean off home prednisone as it may affect wound healing after surgery Prednisone dose decreased to 10 mg daily starting on 08/30/2020. -Continue home inhalers, albuterol nebulizer as needed.  -Continue supplemental oxygen.  Essential hypertension:  BP is currently stable. -Continue home Lasix and losartan  Neuropathy -Continue home gabapentin.  Class II obesity Body mass index is 35.01 kg/m. Pressure Injury 07/22/20 Buttocks Left Stage 2 -  Partial thickness loss of dermis presenting as a shallow open injury with a red, pink wound bed without slough. (Active)  07/22/20 1600  Location: Buttocks  Location Orientation: Left  Staging: Stage 2 -  Partial thickness loss of dermis presenting as a shallow open injury with a red, pink wound bed without slough.  Wound Description (Comments):   Present on Admission: Yes     Pressure Injury 07/23/20 Heel Right Stage 3 -  Full thickness  tissue loss. Subcutaneous fat may be visible but bone, tendon or muscle are NOT exposed. (Active)  07/23/20 0800  Location: Heel  Location Orientation:  Right  Staging: Stage 3 -  Full thickness tissue loss. Subcutaneous fat may be visible but bone, tendon or muscle are NOT exposed.  Wound Description (Comments):   Present on Admission: Yes   DVT prophylaxis:  SCDs Start: 08/25/20 2132  Code Status: Full code Family Communication: Updated his wife at bedside.  Level of care: Telemetry Medical Status is: Inpatient  Remains inpatient appropriate because:Ongoing diagnostic testing needed not appropriate for outpatient work up, Unsafe d/c plan, IV treatments appropriate due to intensity of illness or inability to take PO and Inpatient level of care appropriate due to severity of illness   Dispo: The patient is from: Home  Anticipated d/c is to: Home with home health services once vascular surgery signs off.  Patient currently is not medically stable to d/c.              Difficult to place patient not applicable.       Consultants:  Vascular surgery, Dr. Donnetta Hutching          Objective: Vitals:   08/30/20 0338 08/30/20 0756 08/30/20 0932 08/30/20 1114  BP: (!) 143/67 (!) 145/80  (!) 99/52  Pulse: 95 94  81  Resp: $Remo'18 11  14  'GONUl$ Temp: 98 F (36.7 C) 97.6 F (36.4 C)  98.6 F (37 C)  TempSrc: Oral Oral  Oral  SpO2: 100% 96% 100% 97%  Weight: 104.6 kg     Height:        Intake/Output Summary (Last 24 hours) at 08/30/2020 1813 Last data filed at 08/30/2020 0834 Gross per 24 hour  Intake 870 ml  Output 1200 ml  Net -330 ml   Filed Weights   08/28/20 1147 08/29/20 0633 08/30/20 0338  Weight: 111 kg 105.7 kg 104.6 kg    Exam:  . General: 75 y.o. year-old male frail-appearing in no acute distress.  He is alert oriented x3.  Hard of hearing.   . Cardiovascular: Regular rate and rhythm no rubs or gallops.  Marland Kitchen Respiratory: Clear to auscultation no wheezes or rales.   . Abdomen: Soft nontender no bowel sounds present.   . Musculoskeletal: Trace lower extremity edema bilaterally.  Right foot  in surgical dressing.   . Skin: Right foot in surgical dressing. Marland Kitchen Psychiatry: Mood is appropriate for condition and setting.  Data Reviewed: CBC: Recent Labs  Lab 08/25/20 1619 08/26/20 0344 08/27/20 0059 08/27/20 2113 08/28/20 0129 08/29/20 0208 08/30/20 0615  WBC 15.8*   < > 10.8* 11.9* 12.0* 10.1 11.6*  NEUTROABS 14.3*  --   --   --  8.7*  --   --   HGB 11.4*   < > 10.3* 10.2* 10.7* 10.3* 9.9*  HCT 36.6*   < > 32.6* 32.5* 34.6* 32.6* 32.2*  MCV 96.1   < > 95.9 95.6 95.3 93.7 95.0  PLT 494*   < > 404* 475* 444* 471* 452*   < > = values in this interval not displayed.   Basic Metabolic Panel: Recent Labs  Lab 08/26/20 1454 08/27/20 0059 08/27/20 2113 08/28/20 0129 08/29/20 0208 08/30/20 0615  NA 141 138  --  136 138 140  K 4.0 4.1  --  3.8 4.2 3.8  CL 106 106  --  104 104 106  CO2 28 27  --  $R'26 28 30  'IE$ GLUCOSE 160* 143*  --  76 110* 85  BUN 11 10  --  $R'9 12 11  'Uo$ CREATININE 1.02 0.97 0.91 0.79 0.83 0.87  CALCIUM 8.6* 8.3*  --  8.3* 8.5* 8.5*  MG  --  2.3  --   --  2.1  --   PHOS 1.8* 3.9  --   --  3.2  --    GFR: Estimated Creatinine Clearance: 86.1 mL/min (by C-G formula based on SCr of 0.87 mg/dL). Liver Function Tests: Recent Labs  Lab 08/25/20 1619 08/26/20 1454 08/27/20 0059 08/29/20 0208  AST 27  --   --   --   ALT 21  --   --   --   ALKPHOS 64  --   --   --   BILITOT 0.7  --   --   --   PROT 7.8  --   --   --   ALBUMIN 3.4* 2.8* 2.5* 2.6*   No results for input(s): LIPASE, AMYLASE in the last 168 hours. No results for input(s): AMMONIA in the last 168 hours. Coagulation Profile: No results for input(s): INR, PROTIME in the last 168 hours. Cardiac Enzymes: No results for input(s): CKTOTAL, CKMB, CKMBINDEX, TROPONINI in the last 168 hours. BNP (last 3 results) No results for input(s): PROBNP in the last 8760 hours. HbA1C: No results for input(s): HGBA1C in the last 72 hours. CBG: No results for input(s): GLUCAP in the last 168 hours. Lipid  Profile: No results for input(s): CHOL, HDL, LDLCALC, TRIG, CHOLHDL, LDLDIRECT in the last 72 hours. Thyroid Function Tests: No results for input(s): TSH, T4TOTAL, FREET4, T3FREE, THYROIDAB in the last 72 hours. Anemia Panel: No results for input(s): VITAMINB12, FOLATE, FERRITIN, TIBC, IRON, RETICCTPCT in the last 72 hours. Urine analysis:    Component Value Date/Time   COLORURINE YELLOW 08/25/2020 1620   APPEARANCEUR CLEAR 08/25/2020 1620   LABSPEC 1.008 08/25/2020 1620   PHURINE 8.0 08/25/2020 1620   GLUCOSEU NEGATIVE 08/25/2020 1620   HGBUR SMALL (A) 08/25/2020 1620   BILIRUBINUR NEGATIVE 08/25/2020 1620   KETONESUR NEGATIVE 08/25/2020 1620   PROTEINUR NEGATIVE 08/25/2020 1620   NITRITE NEGATIVE 08/25/2020 1620   LEUKOCYTESUR NEGATIVE 08/25/2020 1620   Sepsis Labs: $RemoveBefo'[email or web address]'hVpiiuauXJJ$ (procalcitonin:4,lacticidven:4)  ) Recent Results (from the past 240 hour(s))  Blood culture (routine x 2)     Status: None   Collection Time: 08/25/20  5:15 PM   Specimen: BLOOD  Result Value Ref Range Status   Specimen Description   Final    BLOOD LEFT ANTECUBITAL Performed at [facility], [place] [address] [place], [place] [address]    Special Requests   Final    BOTTLES DRAWN AEROBIC ONLY Blood Culture results may not be optimal due to an inadequate volume of blood received in culture bottles Performed at [place] [address], [place], [place] [address]    Culture   Final    NO GROWTH 5 DAYS Performed at [facility], [place] [address], [place], [place] [address]    Report Status 08/30/2020 FINAL  Final  Blood culture (routine x 2)     Status: None (Preliminary result)   Collection Time: 08/25/20  5:26 PM   Specimen: BLOOD  Result Value Ref Range Status   Specimen Description   Final    BLOOD RIGHT ANTECUBITAL Performed at [place] [address], [place], [place] [address]    Special Requests   Final    BOTTLES  DRAWN AEROBIC AND ANAEROBIC  Blood Culture adequate volume Performed at [place] [address] [place], [place] [address]    Culture  Setup Time   Final    GRAM POSITIVE COCCI ANAEROBIC BOTTLE ONLY CRITICAL RESULT CALLED TO, READ BACK BY AND VERIFIED WITH: Limmie Patricia [phone] FCP Performed at [facility], [place] [address], [place], [place] [address]    Culture GRAM POSITIVE COCCI  Final   Report Status PENDING  Incomplete  Aerobic Culture w Gram Stain (superficial specimen)     Status: None   Collection Time: 08/25/20  5:26 PM   Specimen: Foot  Result Value Ref Range Status   Specimen Description   Final    FOOT RIGHT Performed at [place] [address] [place], [place] [address]    Special Requests   Final    NONE Performed at [facility], [place] [address], [place], [place] [address]    Gram Stain   Final    FEW WBC PRESENT, PREDOMINANTLY PMN ABUNDANT GRAM POSITIVE COCCI IN PAIRS IN CLUSTERS ABUNDANT GRAM NEGATIVE RODS FEW GRAM POSITIVE RODS Performed at [facility], [place] [address], [place], [place] [address]    Culture   Final    ABUNDANT GROUP B STREP(S.AGALACTIAE)ISOLATED TESTING AGAINST S. AGALACTIAE NOT ROUTINELY PERFORMED DUE TO PREDICTABILITY OF AMP/PEN/VAN SUSCEPTIBILITY. FEW STAPHYLOCOCCUS AUREUS    Report Status 08/29/2020 FINAL  Final   Organism ID, Bacteria STAPHYLOCOCCUS AUREUS  Final      Susceptibility   Staphylococcus aureus - MIC*    CIPROFLOXACIN <=0.5 SENSITIVE Sensitive     ERYTHROMYCIN >=8 RESISTANT Resistant     GENTAMICIN <=0.5 SENSITIVE Sensitive     OXACILLIN <=0.25 SENSITIVE Sensitive     TETRACYCLINE <=1 SENSITIVE Sensitive     VANCOMYCIN <=0.5 SENSITIVE Sensitive     TRIMETH/SULFA <=10 SENSITIVE Sensitive     CLINDAMYCIN >=8 RESISTANT Resistant     RIFAMPIN <=0.5 SENSITIVE Sensitive     Inducible Clindamycin NEGATIVE Sensitive     * FEW STAPHYLOCOCCUS AUREUS   Blood Culture ID Panel (Reflexed)     Status: None   Collection Time: 08/25/20  5:26 PM  Result Value Ref Range Status   Enterococcus faecalis NOT DETECTED NOT DETECTED Final   Enterococcus Faecium NOT DETECTED NOT DETECTED Final   Listeria monocytogenes NOT DETECTED NOT DETECTED Final   Staphylococcus species NOT DETECTED NOT DETECTED Final   Staphylococcus aureus (BCID) NOT DETECTED NOT DETECTED Final   Staphylococcus epidermidis NOT DETECTED NOT DETECTED Final   Staphylococcus lugdunensis NOT DETECTED NOT DETECTED Final   Streptococcus species NOT DETECTED NOT DETECTED Final   Streptococcus agalactiae NOT DETECTED NOT DETECTED Final   Streptococcus pneumoniae NOT DETECTED NOT DETECTED Final   Streptococcus pyogenes NOT DETECTED NOT DETECTED Final   A.calcoaceticus-baumannii NOT DETECTED NOT DETECTED Final   Bacteroides fragilis NOT DETECTED NOT DETECTED Final   Enterobacterales NOT DETECTED NOT DETECTED Final   Enterobacter cloacae complex NOT DETECTED NOT DETECTED Final   Escherichia coli NOT DETECTED NOT DETECTED Final   Klebsiella aerogenes NOT DETECTED NOT DETECTED Final   Klebsiella oxytoca NOT DETECTED NOT DETECTED Final   Klebsiella pneumoniae NOT DETECTED NOT DETECTED Final   Proteus species NOT DETECTED NOT DETECTED Final   Salmonella species NOT DETECTED NOT DETECTED Final   Serratia marcescens NOT DETECTED NOT DETECTED Final   Haemophilus influenzae NOT DETECTED NOT DETECTED Final   Neisseria meningitidis NOT DETECTED NOT DETECTED Final   Pseudomonas  aeruginosa NOT DETECTED NOT DETECTED Final   Stenotrophomonas maltophilia NOT DETECTED NOT DETECTED Final   Candida albicans NOT DETECTED NOT DETECTED Final   Candida auris NOT DETECTED NOT DETECTED Final   Candida glabrata NOT DETECTED NOT DETECTED Final   Candida krusei NOT DETECTED NOT DETECTED Final   Candida parapsilosis NOT DETECTED NOT DETECTED Final   Candida tropicalis NOT DETECTED NOT DETECTED Final    Cryptococcus neoformans/gattii NOT DETECTED NOT DETECTED Final    Comment: Performed at [facility], [place] [address] [place], [place] [address]  Resp Panel by RT-PCR (Flu A&B, Covid) Nasopharyngeal Swab     Status: None   Collection Time: 08/25/20  6:43 PM   Specimen: Nasopharyngeal Swab; Nasopharyngeal(NP) swabs in vial transport medium  Result Value Ref Range Status   SARS Coronavirus 2 by RT PCR NEGATIVE NEGATIVE Final    Comment: (NOTE) SARS-CoV-2 target nucleic acids are NOT DETECTED.  The SARS-CoV-2 RNA is generally detectable in upper respiratory specimens during the acute phase of infection. The lowest concentration of SARS-CoV-2 viral copies this assay can detect is 138 copies/mL. A negative result does not preclude SARS-Cov-2 infection and should not be used as the sole basis for treatment or other patient management decisions. A negative result may occur with  improper specimen collection/handling, submission of specimen other than nasopharyngeal swab, presence of viral mutation(s) within the areas targeted by this assay, and inadequate number of viral copies(<138 copies/mL). A negative result must be combined with clinical observations, patient history, and epidemiological information. The expected result is Negative.  Fact Sheet for Patients:  EntrepreneurPulse.com.au  Fact Sheet for Healthcare Providers:  IncredibleEmployment.be  This test is no t yet approved or cleared by the [place] FDA and  has been authorized for detection and/or diagnosis of SARS-CoV-2 by FDA under an Emergency Use Authorization (EUA). This EUA will remain  in effect (meaning this test can be used) for the duration of the COVID-19 declaration under Section 564(b)(1) of the Act, 21 U.S.C.section 360bbb-3(b)(1), unless the authorization is terminated  or revoked sooner.       Influenza A by PCR NEGATIVE NEGATIVE Final   Influenza B by PCR  NEGATIVE NEGATIVE Final    Comment: (NOTE) The Xpert Xpress SARS-CoV-2/FLU/RSV plus assay is intended as an aid in the diagnosis of influenza from Nasopharyngeal swab specimens and should not be used as a sole basis for treatment. Nasal washings and aspirates are unacceptable for Xpert Xpress SARS-CoV-2/FLU/RSV testing.  Fact Sheet for Patients: EntrepreneurPulse.com.au  Fact Sheet for Healthcare Providers: IncredibleEmployment.be  This test is not yet approved or cleared by the [place] FDA and has been authorized for detection and/or diagnosis of SARS-CoV-2 by FDA under an Emergency Use Authorization (EUA). This EUA will remain in effect (meaning this test can be used) for the duration of the COVID-19 declaration under Section 564(b)(1) of the Act, 21 U.S.C. section 360bbb-3(b)(1), unless the authorization is terminated or revoked.  Performed at [facility], [place] [address] [place], [place] [address]   Surgical PCR screen     Status: Abnormal   Collection Time: 08/28/20 10:47 AM   Specimen: Nasal Mucosa; Nasal Swab  Result Value Ref Range Status   MRSA, PCR NEGATIVE NEGATIVE Final   Staphylococcus aureus POSITIVE (A) NEGATIVE Final    Comment: (NOTE) The Xpert SA Assay (FDA approved for NASAL specimens in patients 28 years of age and older), is one component of a comprehensive surveillance program. It is not  intended to diagnose infection nor to guide or monitor treatment. Performed at [facility], [place] [address] [place], [place] [address]   Aerobic/Anaerobic Culture w Gram Stain (surgical/deep wound)     Status: None (Preliminary result)   Collection Time: 08/28/20  1:10 PM   Specimen: Toe, Right; Amputation  Result Value Ref Range Status   Specimen Description TOE  Final   Special Requests RIGHT FIFTH TOE  Final   Gram Stain   Final    ABUNDANT WBC PRESENT,BOTH PMN AND MONONUCLEAR RARE GRAM POSITIVE  COCCI Performed at [facility], [place] [address], [place], [place] [address]    Culture   Final    RARE STAPHYLOCOCCUS AUREUS RARE GROUP B STREP(S.AGALACTIAE)ISOLATED TESTING AGAINST S. AGALACTIAE NOT ROUTINELY PERFORMED DUE TO PREDICTABILITY OF AMP/PEN/VAN SUSCEPTIBILITY. SUSCEPTIBILITIES TO FOLLOW NO ANAEROBES ISOLATED; CULTURE IN PROGRESS FOR 5 DAYS    Report Status PENDING  Incomplete  Culture, blood (routine x 2)     Status: None (Preliminary result)   Collection Time: 08/29/20  6:23 AM   Specimen: BLOOD RIGHT HAND  Result Value Ref Range Status   Specimen Description BLOOD RIGHT HAND  Final   Special Requests   Final    BOTTLES DRAWN AEROBIC AND ANAEROBIC Blood Culture results may not be optimal due to an inadequate volume of blood received in culture bottles   Culture   Final    NO GROWTH 1 DAY Performed at [facility], [place] [address] [place], [place] [address]    Report Status PENDING  Incomplete  Culture, blood (routine x 2)     Status: None (Preliminary result)   Collection Time: 08/29/20  6:27 AM   Specimen: BLOOD RIGHT HAND  Result Value Ref Range Status   Specimen Description BLOOD RIGHT HAND  Final   Special Requests   Final    BOTTLES DRAWN AEROBIC ONLY Blood Culture results may not be optimal due to an inadequate volume of blood received in culture bottles   Culture   Final    NO GROWTH 1 DAY Performed at [facility], [place] [address], [place], [place] [address]    Report Status PENDING  Incomplete      Studies: No results found.  Scheduled Meds: . aspirin EC  81 mg Oral Daily  . atorvastatin  40 mg Oral Daily  . fluticasone furoate-vilanterol  1 puff Inhalation Daily  . furosemide  20 mg Oral Daily  . gabapentin  600 mg Oral Q8H  . heparin  5,000 Units Subcutaneous Q8H  . losartan  50 mg Oral Daily  . mupirocin ointment  1 application Nasal BID  . predniSONE  10 mg Oral Q breakfast  . saccharomyces boulardii  250 mg Oral BID  .  sodium chloride flush  3 mL Intravenous Q12H  . umeclidinium bromide  1 puff Inhalation Daily    Continuous Infusions: . sodium chloride    . ceFEPime (MAXIPIME) IV 2 g (08/30/20 1804)  . lactated ringers 10 mL/hr at 08/28/20 1148  . metronidazole 500 mg (08/30/20 1544)  . vancomycin 750 mg (08/30/20 1645)     LOS: 5 days     Kayleen Memos, MD [facility] Pager [phone]  If 7PM-7AM, please contact night-coverage [email or web address] Password [facility] 08/30/2020, 6:13 PM

## 2020-08-30 NOTE — Progress Notes (Signed)
Mobility Specialist - Progress Note   08/30/20 1352  Mobility  Activity Ambulated in room  Level of Assistance Standby assist, set-up cues, supervision of patient - no hands on  Assistive Device Front wheel walker  Distance Ambulated (ft) 24 ft  Mobility Response Tolerated well  Mobility performed by Mobility specialist  $Mobility charge 1 Mobility   Pre-mobility: 100 HR, 96% SpO2 Post-mobility: 94 HR, 96% SpO2  Pt on 4L O2 throughout, his SpO2 remained ~93% during ambulation. He walked to the door, then to his bed. Once sitting on the bed, pt had to transfer to [facility]. He passed a small BM and urinated on himself. Pt was cleaned and gown changed w/ help from NT, then assisted to bed.   Pricilla Handler Mobility Specialist Mobility Specialist Phone: [phone]

## 2020-08-30 NOTE — Progress Notes (Addendum)
[facility]  Subjective  - No new complaints   Objective (!) 145/80 94 97.6 F (36.4 C) (Oral) 11 96%  Intake/Output Summary (Last 24 hours) at 08/30/2020 0853 Last data filed at 08/30/2020 0600 Gross per 24 hour  Intake 1280 ml  Output 1300 ml  Net -20 ml   Doppler DP/PT signals right LE   Mod bloody derange on dressing Wet to dry dressing changed at bedside   Assessment/Planning: POD # 2 Open right fifth toe ray amputation  Dressing saturated, cont. Wet to dry today.  Plan for wound vac placement tomorrow.  DME wound vac order in place for home vac at discharge.   Blood loss anemia stable 9.9 Pain control issues improving Blood culture taken on 08/25/2020 + for gram-positive cocci  Roxy Horseman 08/30/2020 8:53 AM  I have interviewed the patient and examined the patient. I agree with the findings by the PA.  Agree with plans for placement of the [facility] tomorrow.  Gae Gallop, MD [phone]     Laboratory Lab Results: Recent Labs    08/29/20 0208 08/30/20 0615  WBC 10.1 11.6*  HGB 10.3* 9.9*  HCT 32.6* 32.2*  PLT 471* 452*   BMET Recent Labs    08/29/20 0208 08/30/20 0615  NA 138 140  K 4.2 3.8  CL 104 106  CO2 28 30  GLUCOSE 110* 85  BUN 12 11  CREATININE 0.83 0.87  CALCIUM 8.5* 8.5*    COAG No results found for: INR, PROTIME No results found for: PTT

## 2020-08-31 LAB — CBC
HCT: 31.7 % — ABNORMAL LOW (ref 39.0–52.0)
Hemoglobin: 9.9 g/dL — ABNORMAL LOW (ref 13.0–17.0)
MCH: 30.1 pg (ref 26.0–34.0)
MCHC: 31.2 g/dL (ref 30.0–36.0)
MCV: 96.4 fL (ref 80.0–100.0)
Platelets: 442 10*3/uL — ABNORMAL HIGH (ref 150–400)
RBC: 3.29 MIL/uL — ABNORMAL LOW (ref 4.22–5.81)
RDW: 14.5 % (ref 11.5–15.5)
WBC: 11.9 10*3/uL — ABNORMAL HIGH (ref 4.0–10.5)
nRBC: 0 % (ref 0.0–0.2)

## 2020-08-31 NOTE — Progress Notes (Signed)
Mobility Specialist - Progress Note   08/31/20 1303  Mobility  Activity Ambulated in hall;Transferred to/from [facility]  Level of Assistance Standby assist, set-up cues, supervision of patient - no hands on  Assistive Device Front wheel walker  Distance Ambulated (ft) 50 ft  Mobility Response Tolerated well  Mobility performed by Mobility specialist  $Mobility charge 1 Mobility   Distance limited by fatigue. Pt had 2/4 DOE while ambulating. After walking, pt passed a loose stool in the [facility] and then went into bed, wife in room. VSS on 4L O2 throughout.    Pricilla Handler Mobility Specialist Mobility Specialist Phone: [phone]

## 2020-08-31 NOTE — Progress Notes (Signed)
PROGRESS NOTE  Joel Perez OVZ:[id number] DOB: [birth year] DOA: 08/25/2020 PCP: [name], MD  HPI/Recap of past 42 hours: 75 year old M with PMH of diastolic CHF, LBBB, PAD, neuropathy, stasis dermatitis, COPD, chronic hypoxic RF on 4 L and obesity presented to ED with worsening right foot wound, and admitted for sepsis due to right fifth MTP joint osteomyelitis and infected right foot ulcer.  He was started on IV vancomycin and Zosyn.  Blood cultures NGTD.  Vascular surgery, Dr. Donnetta Hutching consulted and recommended transfer from [name] to [facility] for arteriogram and further care.  Status post open right fifth toe ray amputation by Dr. Stanford Breed on 08/28/2020.  Gram positive cocci bacteremia from blood cx taken on 08/25/20.  08/31/20: Patient was seen and examined at his bedside.  He states his right foot pain is well controlled.  Wound VAC placement today.  Per vascular surgery will likely change on Wednesday in order to keep on a Monday Wednesday Friday schedule.    Assessment/Plan: Principal Problem:   Osteomyelitis ([place]) Active Problems:   COPD III with marked reversibility   Chronic respiratory failure with hypoxia (HCC)   PVD (peripheral vascular disease) (HCC)   Sepsis (HCC)   POD #3 post open right fifth toe ray amputation  Presented with severe sepsis from right foot fifth MTP joint osteomyelitis and infected right foot ulcer: POA.   Met criteria with leukocytosis, tachycardia and lactic acidosis.   Blood culture taken on 08/25/2020 + for gram-positive cocci, continue to follow cultures. Repeated blood culture on 08/29/2020 negative to date. History of peripheral artery disease.    De-escalate antibiotics, DC'd cefepime and IV Flagyl on 08/31/2020. Continue IV vancomycin until cultures finalize. -Post amputation, wound VAC application on 77/04/8785. -Local wound care and pain control per vascular surgery. On IV Dilaudid as needed for severe pain Percocet as needed for  moderate pain Continue pain control  Gram positive cocci isolated from anaerobic bottle 08/25/20 Currently on IV vancomycin, continue Conitnue to follow blood cultures until finalized. Repeat blood cultures on 08/29/20 negative to date.  Chronic intermittent loose stools, unclear etiology Self reported intermittent loose stools since December 2021 Continue Florastor probiotics 220 mg twice daily Afebrile, less likely infective Imodium as needed  Peripheral artery disease Continue statin Restart antiplatelet when okay with vascular surgery.  Chronic diastolic CHF  Euvolemic. TTE on 07/22/2020 with LVEF of 60 to 65%, mild LVH and indeterminate DD. -Continue home Lasix 20 mg daily -Monitor fluid and renal status while on diuretics Net I&O -3.6 L Continue strict I's and O's and daily weight.  Chronic COPD/chronic hypoxic respiratory failure:  Stable on home 4 L.   Currently at baseline. Continue home prednisone 20 mg   Followed by pulmonology. Wean off home prednisone as it may affect wound healing after surgery Prednisone dose decreased to 10 mg daily starting on 08/30/2020. -Continue home inhalers, albuterol nebulizer as needed.  -Continue supplemental oxygen.  Essential hypertension:  BP is currently stable. -Continue home Lasix and losartan  Neuropathy -Continue home gabapentin.  Class II obesity Body mass index is 35.01 kg/m. Pressure Injury 07/22/20 Buttocks Left Stage 2 -  Partial thickness loss of dermis presenting as a shallow open injury with a red, pink wound bed without slough. (Active)  07/22/20 1600  Location: Buttocks  Location Orientation: Left  Staging: Stage 2 -  Partial thickness loss of dermis presenting as a shallow open injury with a red, pink wound bed without slough.  Wound Description (Comments):  Present on Admission: Yes     Pressure Injury 07/23/20 Heel Right Stage 3 -  Full thickness tissue loss. Subcutaneous fat may be visible but bone,  tendon or muscle are NOT exposed. (Active)  07/23/20 0800  Location: Heel  Location Orientation: Right  Staging: Stage 3 -  Full thickness tissue loss. Subcutaneous fat may be visible but bone, tendon or muscle are NOT exposed.  Wound Description (Comments):   Present on Admission: Yes   DVT prophylaxis:  SCDs Start: 08/25/20 2132/subcu heparin 3 times daily from 08/27/2020.  Code Status: Full code Family Communication: Updated his wife at bedside.  Level of care: Telemetry Medical Status is: Inpatient  Remains inpatient appropriate because:Ongoing diagnostic testing needed not appropriate for outpatient work up, Unsafe d/c plan, IV treatments appropriate due to intensity of illness or inability to take PO and Inpatient level of care appropriate due to severity of illness   Dispo: The patient is from: Home  Anticipated d/c is to: Home with home health services once vascular surgery signs off.  Patient currently is not medically stable to d/c.              Difficult to place patient not applicable.       Consultants:  Vascular surgery, Dr. Donnetta Hutching          Objective: Vitals:   08/31/20 0734 08/31/20 0739 08/31/20 0800 08/31/20 1228  BP:   123/61 126/76  Pulse:   86 87  Resp:   17 20  Temp:   97.9 F (36.6 C) 98.8 F (37.1 C)  TempSrc:   Oral Oral  SpO2: 97% 98% 97% 100%  Weight:      Height:        Intake/Output Summary (Last 24 hours) at 08/31/2020 1502 Last data filed at 08/31/2020 1200 Gross per 24 hour  Intake 200 ml  Output 1250 ml  Net -1050 ml   Filed Weights   08/28/20 1147 08/29/20 0633 08/30/20 0338  Weight: 111 kg 105.7 kg 104.6 kg    Exam:  . General: 75 y.o. year-old male chronically ill-appearing no acute distress.  He is alert and oriented x3. . Cardiovascular: Regular rate and rhythm no rubs or gallops.  Marland Kitchen Respiratory: Clear to auscultation no wheezes or rales. . Abdomen: Obese nontender normal bowel  sounds present.   . Musculoskeletal: Trace lower extremity edema bilaterally.  Right foot in surgical dressing.   . Skin: Right foot in surgical dressing. Marland Kitchen Psychiatry: Mood is appropriate for condition and setting.  Data Reviewed: CBC: Recent Labs  Lab 08/25/20 1619 08/26/20 0344 08/27/20 2113 08/28/20 0129 08/29/20 0208 08/30/20 0615 08/31/20 0034  WBC 15.8*   < > 11.9* 12.0* 10.1 11.6* 11.9*  NEUTROABS 14.3*  --   --  8.7*  --   --   --   HGB 11.4*   < > 10.2* 10.7* 10.3* 9.9* 9.9*  HCT 36.6*   < > 32.5* 34.6* 32.6* 32.2* 31.7*  MCV 96.1   < > 95.6 95.3 93.7 95.0 96.4  PLT 494*   < > 475* 444* 471* 452* 442*   < > = values in this interval not displayed.   Basic Metabolic Panel: Recent Labs  Lab 08/26/20 1454 08/27/20 0059 08/27/20 2113 08/28/20 0129 08/29/20 0208 08/30/20 0615  NA 141 138  --  136 138 140  K 4.0 4.1  --  3.8 4.2 3.8  CL 106 106  --  104 104 106  CO2 28 27  --  _0 GLUCOSE 160* 143*  --  76 110* 85  BUN 11 10  --  _1 CREATININE 1.02 0.97 0.91 0.79 0.83 0.87  CALCIUM 8.6* 8.3*  --  8.3* 8.5* 8.5*  MG  --  2.3  --   --  2.1  --   PHOS 1.8* 3.9  --   --  3.2  --    GFR: Estimated Creatinine Clearance: 86.1 mL/min (by C-G formula based on SCr of 0.87 mg/dL). Liver Function Tests: Recent Labs  Lab 08/25/20 1619 08/26/20 1454 08/27/20 0059 08/29/20 0208  AST 27  --   --   --   ALT 21  --   --   --   ALKPHOS 64  --   --   --   BILITOT 0.7  --   --   --   PROT 7.8  --   --   --   ALBUMIN 3.4* 2.8* 2.5* 2.6*   No results for input(s): LIPASE, AMYLASE in the last 168 hours. No results for input(s): AMMONIA in the last 168 hours. Coagulation Profile: No results for input(s): INR, PROTIME in the last 168 hours. Cardiac Enzymes: No results for input(s): CKTOTAL, CKMB, CKMBINDEX, TROPONINI in the last 168 hours. BNP (last 3 results) No results for input(s): PROBNP in the last 8760 hours. HbA1C: No results for input(s): HGBA1C in  the last 72 hours. CBG: No results for input(s): GLUCAP in the last 168 hours. Lipid Profile: No results for input(s): CHOL, HDL, LDLCALC, TRIG, CHOLHDL, LDLDIRECT in the last 72 hours. Thyroid Function Tests: No results for input(s): TSH, T4TOTAL, FREET4, T3FREE, THYROIDAB in the last 72 hours. Anemia Panel: No results for input(s): VITAMINB12, FOLATE, FERRITIN, TIBC, IRON, RETICCTPCT in the last 72 hours. Urine analysis:    Component Value Date/Time   COLORURINE YELLOW 08/25/2020 1620   APPEARANCEUR CLEAR 08/25/2020 1620   LABSPEC 1.008 08/25/2020 1620   PHURINE 8.0 08/25/2020 1620   GLUCOSEU NEGATIVE 08/25/2020 1620   HGBUR SMALL (A) 08/25/2020 1620   BILIRUBINUR NEGATIVE 08/25/2020 1620   KETONESUR NEGATIVE 08/25/2020 1620   PROTEINUR NEGATIVE 08/25/2020 1620   NITRITE NEGATIVE 08/25/2020 1620   LEUKOCYTESUR NEGATIVE 08/25/2020 1620   Sepsis Labs: _2 (procalcitonin:4,lacticidven:4)  ) Recent Results (from the past 240 hour(s))  Blood culture (routine x 2)     Status: None   Collection Time: 08/25/20  5:15 PM   Specimen: BLOOD  Result Value Ref Range Status   Specimen Description   Final    BLOOD LEFT ANTECUBITAL Performed at [facility], [place] [address], [place], [place] [address]    Special Requests   Final    BOTTLES DRAWN AEROBIC ONLY Blood Culture results may not be optimal due to an inadequate volume of blood received in culture bottles Performed at [place] [address] [place], [place] [address]    Culture   Final    NO GROWTH 5 DAYS Performed at [facility], [place] [address], [place], [place] [address]    Report Status 08/30/2020 FINAL  Final  Blood culture (routine x 2)     Status: None (Preliminary result)   Collection Time: 08/25/20  5:26 PM   Specimen: BLOOD  Result Value Ref Range Status   Specimen Description   Final    BLOOD RIGHT ANTECUBITAL Performed at [place] [address], [place],  [address]    Special Requests  Final    BOTTLES DRAWN AEROBIC AND ANAEROBIC Blood Culture adequate volume Performed at [place] [address] [place], [place] [address]    Culture  Setup Time   Final    GRAM POSITIVE COCCI ANAEROBIC BOTTLE ONLY CRITICAL RESULT CALLED TO, READ BACK BY AND VERIFIED WITH: Limmie Patricia [phone] FCP Performed at [facility], [place] [address] [place], [place] [address]    Culture GRAM POSITIVE COCCI  Final   Report Status PENDING  Incomplete  Aerobic Culture w Gram Stain (superficial specimen)     Status: None   Collection Time: 08/25/20  5:26 PM   Specimen: Foot  Result Value Ref Range Status   Specimen Description   Final    FOOT RIGHT Performed at [place] [address], [place], [place] [address]    Special Requests   Final    NONE Performed at [facility], [place] [address], [place], [place] [address]    Gram Stain   Final    FEW WBC PRESENT, PREDOMINANTLY PMN ABUNDANT GRAM POSITIVE COCCI IN PAIRS IN CLUSTERS ABUNDANT GRAM NEGATIVE RODS FEW GRAM POSITIVE RODS Performed at [facility], [place] [address], [place], [place] [address]    Culture   Final    ABUNDANT GROUP B STREP(S.AGALACTIAE)ISOLATED TESTING AGAINST S. AGALACTIAE NOT ROUTINELY PERFORMED DUE TO PREDICTABILITY OF AMP/PEN/VAN SUSCEPTIBILITY. FEW STAPHYLOCOCCUS AUREUS    Report Status 08/29/2020 FINAL  Final   Organism ID, Bacteria STAPHYLOCOCCUS AUREUS  Final      Susceptibility   Staphylococcus aureus - MIC*    CIPROFLOXACIN <=0.5 SENSITIVE Sensitive     ERYTHROMYCIN >=8 RESISTANT Resistant     GENTAMICIN <=0.5 SENSITIVE Sensitive     OXACILLIN <=0.25 SENSITIVE Sensitive     TETRACYCLINE <=1 SENSITIVE Sensitive     VANCOMYCIN <=0.5 SENSITIVE Sensitive     TRIMETH/SULFA <=10 SENSITIVE Sensitive     CLINDAMYCIN >=8 RESISTANT Resistant     RIFAMPIN <=0.5 SENSITIVE  Sensitive     Inducible Clindamycin NEGATIVE Sensitive     * FEW STAPHYLOCOCCUS AUREUS  Blood Culture ID Panel (Reflexed)     Status: None   Collection Time: 08/25/20  5:26 PM  Result Value Ref Range Status   Enterococcus faecalis NOT DETECTED NOT DETECTED Final   Enterococcus Faecium NOT DETECTED NOT DETECTED Final   Listeria monocytogenes NOT DETECTED NOT DETECTED Final   Staphylococcus species NOT DETECTED NOT DETECTED Final   Staphylococcus aureus (BCID) NOT DETECTED NOT DETECTED Final   Staphylococcus epidermidis NOT DETECTED NOT DETECTED Final   Staphylococcus lugdunensis NOT DETECTED NOT DETECTED Final   Streptococcus species NOT DETECTED NOT DETECTED Final   Streptococcus agalactiae NOT DETECTED NOT DETECTED Final   Streptococcus pneumoniae NOT DETECTED NOT DETECTED Final   Streptococcus pyogenes NOT DETECTED NOT DETECTED Final   A.calcoaceticus-baumannii NOT DETECTED NOT DETECTED Final   Bacteroides fragilis NOT DETECTED NOT DETECTED Final   Enterobacterales NOT DETECTED NOT DETECTED Final   Enterobacter cloacae complex NOT DETECTED NOT DETECTED Final   Escherichia coli NOT DETECTED NOT DETECTED Final   Klebsiella aerogenes NOT DETECTED NOT DETECTED Final   Klebsiella oxytoca NOT DETECTED NOT DETECTED Final   Klebsiella pneumoniae NOT DETECTED NOT DETECTED Final   Proteus species NOT DETECTED NOT DETECTED Final   Salmonella species NOT DETECTED NOT DETECTED Final   Serratia marcescens NOT DETECTED NOT DETECTED Final   Haemophilus influenzae NOT DETECTED NOT DETECTED Final   Neisseria  meningitidis NOT DETECTED NOT DETECTED Final   Pseudomonas aeruginosa NOT DETECTED NOT DETECTED Final   Stenotrophomonas maltophilia NOT DETECTED NOT DETECTED Final   Candida albicans NOT DETECTED NOT DETECTED Final   Candida auris NOT DETECTED NOT DETECTED Final   Candida glabrata NOT DETECTED NOT DETECTED Final   Candida krusei NOT DETECTED NOT DETECTED Final   Candida parapsilosis NOT  DETECTED NOT DETECTED Final   Candida tropicalis NOT DETECTED NOT DETECTED Final   Cryptococcus neoformans/gattii NOT DETECTED NOT DETECTED Final    Comment: Performed at [facility], [place] [address], [place], [place] [address]  Resp Panel by RT-PCR (Flu A&B, Covid) Nasopharyngeal Swab     Status: None   Collection Time: 08/25/20  6:43 PM   Specimen: Nasopharyngeal Swab; Nasopharyngeal(NP) swabs in vial transport medium  Result Value Ref Range Status   SARS Coronavirus 2 by RT PCR NEGATIVE NEGATIVE Final    Comment: (NOTE) SARS-CoV-2 target nucleic acids are NOT DETECTED.  The SARS-CoV-2 RNA is generally detectable in upper respiratory specimens during the acute phase of infection. The lowest concentration of SARS-CoV-2 viral copies this assay can detect is 138 copies/mL. A negative result does not preclude SARS-Cov-2 infection and should not be used as the sole basis for treatment or other patient management decisions. A negative result may occur with  improper specimen collection/handling, submission of specimen other than nasopharyngeal swab, presence of viral mutation(s) within the areas targeted by this assay, and inadequate number of viral copies(<138 copies/mL). A negative result must be combined with clinical observations, patient history, and epidemiological information. The expected result is Negative.  Fact Sheet for Patients:  EntrepreneurPulse.com.au  Fact Sheet for Healthcare Providers:  IncredibleEmployment.be  This test is no t yet approved or cleared by the [place] FDA and  has been authorized for detection and/or diagnosis of SARS-CoV-2 by FDA under an Emergency Use Authorization (EUA). This EUA will remain  in effect (meaning this test can be used) for the duration of the COVID-19 declaration under Section 564(b)(1) of the Act, 21 U.S.C.section 360bbb-3(b)(1), unless the authorization is terminated  or revoked  sooner.       Influenza A by PCR NEGATIVE NEGATIVE Final   Influenza B by PCR NEGATIVE NEGATIVE Final    Comment: (NOTE) The Xpert Xpress SARS-CoV-2/FLU/RSV plus assay is intended as an aid in the diagnosis of influenza from Nasopharyngeal swab specimens and should not be used as a sole basis for treatment. Nasal washings and aspirates are unacceptable for Xpert Xpress SARS-CoV-2/FLU/RSV testing.  Fact Sheet for Patients: EntrepreneurPulse.com.au  Fact Sheet for Healthcare Providers: IncredibleEmployment.be  This test is not yet approved or cleared by the [place] FDA and has been authorized for detection and/or diagnosis of SARS-CoV-2 by FDA under an Emergency Use Authorization (EUA). This EUA will remain in effect (meaning this test can be used) for the duration of the COVID-19 declaration under Section 564(b)(1) of the Act, 21 U.S.C. section 360bbb-3(b)(1), unless the authorization is terminated or revoked.  Performed at [facility], [place] [address], [place], [place] [address]   Surgical PCR screen     Status: Abnormal   Collection Time: 08/28/20 10:47 AM   Specimen: Nasal Mucosa; Nasal Swab  Result Value Ref Range Status   MRSA, PCR NEGATIVE NEGATIVE Final   Staphylococcus aureus POSITIVE (A) NEGATIVE Final    Comment: (NOTE) The Xpert SA Assay (FDA approved for NASAL specimens in patients 44 years of age and older), is one  component of a comprehensive surveillance program. It is not intended to diagnose infection nor to guide or monitor treatment. Performed at [facility], [place] [address] [place], [place] [address]   Aerobic/Anaerobic Culture w Gram Stain (surgical/deep wound)     Status: None (Preliminary result)   Collection Time: 08/28/20  1:10 PM   Specimen: Toe, Right; Amputation  Result Value Ref Range Status   Specimen Description TOE  Final   Special Requests RIGHT FIFTH TOE  Final   Gram  Stain   Final    ABUNDANT WBC PRESENT,BOTH PMN AND MONONUCLEAR RARE GRAM POSITIVE COCCI Performed at [facility], [place] [address], [place], [place] [address]    Culture   Final    RARE STAPHYLOCOCCUS AUREUS RARE GROUP B STREP(S.AGALACTIAE)ISOLATED TESTING AGAINST S. AGALACTIAE NOT ROUTINELY PERFORMED DUE TO PREDICTABILITY OF AMP/PEN/VAN SUSCEPTIBILITY. NO ANAEROBES ISOLATED; CULTURE IN PROGRESS FOR 5 DAYS    Report Status PENDING  Incomplete   Organism ID, Bacteria STAPHYLOCOCCUS AUREUS  Final      Susceptibility   Staphylococcus aureus - MIC*    CIPROFLOXACIN <=0.5 SENSITIVE Sensitive     ERYTHROMYCIN >=8 RESISTANT Resistant     GENTAMICIN <=0.5 SENSITIVE Sensitive     OXACILLIN 0.5 SENSITIVE Sensitive     TETRACYCLINE <=1 SENSITIVE Sensitive     VANCOMYCIN 1 SENSITIVE Sensitive     TRIMETH/SULFA <=10 SENSITIVE Sensitive     CLINDAMYCIN >=8 RESISTANT Resistant     RIFAMPIN <=0.5 SENSITIVE Sensitive     Inducible Clindamycin NEGATIVE Sensitive     * RARE STAPHYLOCOCCUS AUREUS  Culture, blood (routine x 2)     Status: None (Preliminary result)   Collection Time: 08/29/20  6:23 AM   Specimen: BLOOD RIGHT HAND  Result Value Ref Range Status   Specimen Description BLOOD RIGHT HAND  Final   Special Requests   Final    BOTTLES DRAWN AEROBIC AND ANAEROBIC Blood Culture results may not be optimal due to an inadequate volume of blood received in culture bottles   Culture   Final    NO GROWTH 2 DAYS Performed at [facility], [place] [address], [place], [place] [address]    Report Status PENDING  Incomplete  Culture, blood (routine x 2)     Status: None (Preliminary result)   Collection Time: 08/29/20  6:27 AM   Specimen: BLOOD RIGHT HAND  Result Value Ref Range Status   Specimen Description BLOOD RIGHT HAND  Final   Special Requests   Final    BOTTLES DRAWN AEROBIC ONLY Blood Culture results may not be optimal due to an inadequate volume of blood received in culture  bottles   Culture   Final    NO GROWTH 2 DAYS Performed at [facility], [place] [address], [place],  [address]    Report Status PENDING  Incomplete      Studies: No results found.  Scheduled Meds: . aspirin EC  81 mg Oral Daily  . atorvastatin  40 mg Oral Daily  . fluticasone furoate-vilanterol  1 puff Inhalation Daily  . furosemide  20 mg Oral Daily  . gabapentin  600 mg Oral Q8H  . heparin  5,000 Units Subcutaneous Q8H  . losartan  50 mg Oral Daily  . mupirocin ointment  1 application Nasal BID  . predniSONE  10 mg Oral Q breakfast  . saccharomyces boulardii  250 mg Oral BID  . sodium chloride flush  3 mL Intravenous Q12H  .  umeclidinium bromide  1 puff Inhalation Daily    Continuous Infusions: . sodium chloride    . ceFEPime (MAXIPIME) IV 2 g (08/31/20 0851)  . lactated ringers 10 mL/hr at 08/28/20 1148  . vancomycin 750 mg (08/31/20 0530)     LOS: 6 days     Kayleen Memos, MD [facility] Pager [phone]  If 7PM-7AM, please contact night-coverage [email or web address] Password [email or web address] 08/31/2020, 3:02 PM

## 2020-08-31 NOTE — Progress Notes (Signed)
   VASCULAR SURGERY ASSESSMENT & PLAN:   S/P RAY AMPUTATION RIGHT FIFTH TOE: The wound has good granulation tissue.  For this reason we placed a VAC on the open toe amputation site this morning.  This will be changed 3 times a week.  Will probably change on Wednesday to get him on a Monday Wednesday Friday schedule.   SUBJECTIVE:   No specific complaints.  PHYSICAL EXAM:   Vitals:   08/31/20 0000 08/31/20 0319 08/31/20 0734 08/31/20 0739  BP: 119/71 116/65    Pulse: 69 81    Resp: 15 14    Temp: 98.2 F (36.8 C) 98.5 F (36.9 C)    TempSrc: Oral Oral    SpO2: 99% 100% 97% 98%  Weight:      Height:       The wound is granulating well and therefore we placed a VAC today.  LABS:   Lab Results  Component Value Date   WBC 11.9 (H) 08/31/2020   HGB 9.9 (L) 08/31/2020   HCT 31.7 (L) 08/31/2020   MCV 96.4 08/31/2020   PLT 442 (H) 08/31/2020   Lab Results  Component Value Date   CREATININE 0.87 08/30/2020   No results found for: INR, PROTIME CBG (last 3)  No results for input(s): GLUCAP in the last 72 hours.  PROBLEM LIST:    Principal Problem:   Osteomyelitis ([place]) Active Problems:   COPD III with marked reversibility   Chronic respiratory failure with hypoxia (HCC)   PVD (peripheral vascular disease) (HCC)   Sepsis (HCC)   CURRENT MEDS:   . aspirin EC  81 mg Oral Daily  . atorvastatin  40 mg Oral Daily  . fluticasone furoate-vilanterol  1 puff Inhalation Daily  . furosemide  20 mg Oral Daily  . gabapentin  600 mg Oral Q8H  . heparin  5,000 Units Subcutaneous Q8H  . losartan  50 mg Oral Daily  . mupirocin ointment  1 application Nasal BID  . predniSONE  10 mg Oral Q breakfast  . saccharomyces boulardii  250 mg Oral BID  . sodium chloride flush  3 mL Intravenous Q12H  . umeclidinium bromide  1 puff Inhalation Daily    Joel Perez Office: [phone] 08/31/2020

## 2020-09-01 ENCOUNTER — Inpatient Hospital Stay: Payer: Self-pay

## 2020-09-01 DIAGNOSIS — M869 Osteomyelitis, unspecified: Secondary | ICD-10-CM

## 2020-09-01 DIAGNOSIS — B951 Streptococcus, group B, as the cause of diseases classified elsewhere: Secondary | ICD-10-CM

## 2020-09-01 DIAGNOSIS — A401 Sepsis due to streptococcus, group B: Secondary | ICD-10-CM | POA: Diagnosis not present

## 2020-09-01 DIAGNOSIS — Z89421 Acquired absence of other right toe(s): Secondary | ICD-10-CM

## 2020-09-01 MED ORDER — CEFAZOLIN IV (FOR PTA / DISCHARGE USE ONLY): 2.0000 g | Freq: Three times a day (TID) | INTRAVENOUS | 0 refills | Status: DC

## 2020-09-01 MED ORDER — CEFAZOLIN SODIUM-DEXTROSE 2-4 GM/100ML-% IV SOLN
2.0000 g | Freq: Three times a day (TID) | INTRAVENOUS | Status: DC
  Administered 2020-09-01 – 2020-09-03 (×7): 2 g via INTRAVENOUS
  Filled 2020-09-01 (×8): qty 100

## 2020-09-01 MED ORDER — CHLORHEXIDINE GLUCONATE CLOTH 2 % EX PADS
6.0000 | MEDICATED_PAD | Freq: Every day | CUTANEOUS | Status: DC
  Administered 2020-09-02 – 2020-09-03 (×2): 6 via TOPICAL

## 2020-09-01 MED ORDER — SODIUM CHLORIDE 0.9% FLUSH
10.0000 mL | Freq: Two times a day (BID) | INTRAVENOUS | Status: DC
  Administered 2020-09-01 – 2020-09-02 (×3): 10 mL

## 2020-09-01 MED ORDER — SODIUM CHLORIDE 0.9% FLUSH
10.0000 mL | INTRAVENOUS | Status: DC | PRN
  Administered 2020-09-02: 10 mL

## 2020-09-01 NOTE — Progress Notes (Signed)
Physical Therapy Treatment Patient Details Name: Joel Perez MRN: [id number] DOB: 24-Dec-1945 Today's Date: 09/01/2020    History of Present Illness pt is a 75 y/o male admitted 4/4 with worsening R foot wound.  Found severe sepsis from R foo fifth MTP jt osteomyelitis and infected R foot ulcer. s/p open right fifth toe amputation.  PMHx:  neuropathy, HTN, COPD, blindness R eye.    PT Comments    Pt progressing well, ambulatory in hallway with RW and min guard for safety/cuing. Pt with SpO2 of 86% on 4LO2 with DOE 2/4 at one point during gait, recovered with breathing technique and cues for standing rest. Pt becoming increasingly irritable as session wore on, suspect due to pain as pt asking for pain medications at end of session. PT to continue to follow acutely.    Follow Up Recommendations  Home health PT     Equipment Recommendations  None recommended by PT    Recommendations for Other Services       Precautions / Restrictions Precautions Precautions: Fall Required Braces or Orthoses: Other Brace Other Brace: DARCO Shoe Restrictions Weight Bearing Restrictions: No RLE Weight Bearing: Weight bearing as tolerated    Mobility  Bed Mobility Overal bed mobility: Needs Assistance Bed Mobility: Sit to Supine     Supine to sit: Supervision     General bed mobility comments: for safety    Transfers Overall transfer level: Needs assistance Equipment used: Rolling walker (2 wheeled) Transfers: Sit to/from [facility] Sit to Stand: Min assist         General transfer comment: min assist for power up and rise, STS x2 from recliner and BSC  Ambulation/Gait Ambulation/Gait assistance: Min guard Gait Distance (Feet): 70 Feet (x2 standing rest breaks) Assistive device: Rolling walker (2 wheeled) Gait Pattern/deviations: Step-through pattern;Decreased stride length;Trunk flexed Gait velocity: decr   General Gait Details: min gaurd for safety, verbal  cuing for placement in RW, activity pacing and breathing technique. spO2 min 86% on 4LO2, recovered to 88-96% with breathing technique and standing rest.   Stairs             Wheelchair Mobility    Modified Rankin (Stroke Patients Only)       Balance Overall balance assessment: Needs assistance Sitting-balance support: No upper extremity supported Sitting balance-Leahy Scale: Good     Standing balance support: Single extremity supported Standing balance-Leahy Scale: Poor Standing balance comment: reliant on AD due to pain and darco shoe.                            Cognition Arousal/Alertness: Awake/alert Behavior During Therapy: WFL for tasks assessed/performed Overall Cognitive Status: Within Functional Limits for tasks assessed                                        Exercises      General Comments General comments (skin integrity, edema, etc.): HRmax 123 bpm, Spo2 86-96% on 4LO2      Pertinent Vitals/Pain Pain Assessment: Faces Faces Pain Scale: Hurts even more Pain Location: R foot Pain Descriptors / Indicators: Discomfort;Guarding;Sore Pain Intervention(s): Limited activity within patient's tolerance;Monitored during session;Repositioned    Home Living                      Prior Function  PT Goals (current goals can now be found in the care plan section) Acute Rehab PT Goals Patient Stated Goal: Be able to get around the house and outside down the ramp. PT Goal Formulation: With patient Time For Goal Achievement: 09/12/20 Potential to Achieve Goals: Good Progress towards PT goals: Progressing toward goals    Frequency    Min 3X/week      PT Plan Current plan remains appropriate    Co-evaluation              AM-PAC PT "6 Clicks" Mobility   Outcome Measure  Help needed turning from your back to your side while in a flat bed without using bedrails?: A Little Help needed moving from  lying on your back to sitting on the side of a flat bed without using bedrails?: A Little Help needed moving to and from a bed to a chair (including a wheelchair)?: A Little Help needed standing up from a chair using your arms (e.g., wheelchair or bedside chair)?: A Little Help needed to walk in hospital room?: A Little Help needed climbing 3-5 steps with a railing? : A Lot 6 Click Score: 17    End of Session Equipment Utilized During Treatment: Oxygen Activity Tolerance: Patient tolerated treatment well;Patient limited by pain Patient left: with family/visitor present;in bed;with bed alarm set;with call bell/phone within reach Nurse Communication: Mobility status PT Visit Diagnosis: Unsteadiness on feet (R26.81);Other abnormalities of gait and mobility (R26.89);Pain Pain - Right/Left: Right Pain - part of body:  (foot)     Time: 7579-7282 PT Time Calculation (min) (ACUTE ONLY): 30 min  Charges:  $Gait Training: 8-22 mins $Therapeutic Activity: 8-22 mins                     Stacie Glaze, PT Acute Rehabilitation Services Pager [phone]  Office [phone]    Roxine Caddy E Ruffin Pyo 09/01/2020, 6:09 PM

## 2020-09-01 NOTE — Progress Notes (Addendum)
  Progress Note    09/01/2020 7:25 AM 4 Days Post-Op  Subjective:  No complaints this morning   Vitals:   09/01/20 0348 09/01/20 0721  BP: 128/69 (!) 140/55  Pulse:  82  Resp: 15 18  Temp: 97.6 F (36.4 C) 98.4 F (36.9 C)  SpO2: 100% 100%   Physical Exam: Lungs:  Non labored Incisions:  R foot with vac in place; good seal; minimal serosanguinous output Extremities:  R ATA and PTA with doppler signal Neurologic: A&O  CBC    Component Value Date/Time   WBC 11.9 (H) 08/31/2020 0034   RBC 3.29 (L) 08/31/2020 0034   HGB 9.9 (L) 08/31/2020 0034   HCT 31.7 (L) 08/31/2020 0034   PLT 442 (H) 08/31/2020 0034   MCV 96.4 08/31/2020 0034   MCH 30.1 08/31/2020 0034   MCHC 31.2 08/31/2020 0034   RDW 14.5 08/31/2020 0034   LYMPHSABS 2.0 08/28/2020 0129   MONOABS 0.9 08/28/2020 0129   EOSABS 0.1 08/28/2020 0129   BASOSABS 0.0 08/28/2020 0129    BMET    Component Value Date/Time   NA 140 08/30/2020 0615   NA 140 01/04/2019 1451   K 3.8 08/30/2020 0615   CL 106 08/30/2020 0615   CO2 30 08/30/2020 0615   GLUCOSE 85 08/30/2020 0615   BUN 11 08/30/2020 0615   BUN 12 01/04/2019 1451   CREATININE 0.87 08/30/2020 0615   CALCIUM 8.5 (L) 08/30/2020 0615   GFRNONAA >60 08/30/2020 0615   GFRAA 91 01/04/2019 1451    INR No results found for: INR   Intake/Output Summary (Last 24 hours) at 09/01/2020 0725 Last data filed at 09/01/2020 0600 Gross per 24 hour  Intake 443 ml  Output 1475 ml  Net -1032 ml     Assessment/Plan:  75 y.o. male is s/p open ray amputation R 5th toe 4 Days Post-Op   RLE well perfused with ATA and PTA signal by doppler Wound vac with good seal; Next change on Wednesday OOB with darco shoe Patient willl need HH PT as well as RN for vac changes 3x/week   Dagoberto Ligas, PA-C [facility] [phone] 09/01/2020 7:25 AM  VASCULAR STAFF ADDENDUM: I agree with the above.  Continue VAC changes 3x/week.  [name]. Stanford Breed,  MD [facility] Phone Number: [phone] 09/01/2020 11:11 AM

## 2020-09-01 NOTE — TOC Progression Note (Addendum)
Transition of Care (TOC) - Progression Note  Marvetta Gibbons RN, BSN Transitions of Care Unit 4E- RN Case Manager See Treatment Team for direct phone #    Patient Details  Name: Joel Perez MRN: [id number] Date of Birth: Mar 27, 1946  Transition of Care [facility]) CM/SW Contact  Dahlia Client, Romeo Rabon, RN Phone Number: 09/01/2020, 2:29 PM  Clinical Narrative:    Checked with Olivia Mackie at [facility]- home wound VAC still pending approval. Noted Vascular plan to change wound VAC drsg here on Wednesday will f/u with [facility] regarding home wound VAC approval and release tomorrow.  HHRN/PT has been set up with [facility]   Expected Discharge Plan: [place] Barriers to Discharge: Continued Medical Work up  Expected Discharge Plan and Services Expected Discharge Plan: [place]   Discharge Planning Services: CM Consult Post Acute Care Choice: [place] arrangements for the past 2 months: Single Family Home                 DME Arranged: Vac DME Agency: [facility] Date DME Agency Contacted: 08/29/20 Time DME Agency Contacted: [phone] Representative spoke with at DME Agency: Olivia Mackie HH Arranged: RN,PT [place] Agency: [facility] [place] Date [place]: 08/29/20 Time [place]: 1600 Representative spoke with at [place]: Amy   Social Determinants of Health ([place]) Interventions    Readmission Risk Interventions No flowsheet data found.

## 2020-09-01 NOTE — Progress Notes (Signed)
PHARMACY CONSULT NOTE FOR:  OUTPATIENT  PARENTERAL ANTIBIOTIC THERAPY (OPAT)  Indication: Osteomyelitis of right fifth toe  Regimen: Cefazolin 2 gm IV Q 8 hours  End date: 09/25/20  IV antibiotic discharge orders are pended. To discharging provider:  please sign these orders via discharge navigator,  Select New Orders & click on the button choice - Manage This Unsigned Work.     Thank you for allowing pharmacy to be a part of this patient's care.  Jimmy Footman, PharmD, BCPS, [facility] Clinical Pharmacist Phone: [phone] 09/01/2020, 2:07 PM

## 2020-09-01 NOTE — Care Management Important Message (Signed)
Important Message  Patient Details  Name: Joel Perez MRN: [id number] Date of Birth: 03-29-1946   Medicare Important Message Given:  Yes     Shelda Altes 09/01/2020, 12:49 PM

## 2020-09-01 NOTE — Progress Notes (Signed)
Peripherally Inserted Central Catheter Placement  The IV Nurse has discussed with the patient and/or persons authorized to consent for the patient, the purpose of this procedure and the potential benefits and risks involved with this procedure.  The benefits include less needle sticks, lab draws from the catheter, and the patient may be discharged home with the catheter. Risks include, but not limited to, infection, bleeding, blood clot (thrombus formation), and puncture of an artery; nerve damage and irregular heartbeat and possibility to perform a PICC exchange if needed/ordered by physician.  Alternatives to this procedure were also discussed.  Bard Power PICC patient education guide, fact sheet on infection prevention and patient information card has been provided to patient /or left at bedside.    PICC Placement Documentation  PICC Single Lumen 21/58/72 PICC Left Cephalic 50 cm 0 cm (Active)  Indication for Insertion or Continuance of Line Prolonged intravenous therapies 09/01/20 2221  Exposed Catheter (cm) 0 cm 09/01/20 2221  Site Assessment Clean;Dry;Intact 09/01/20 2221  Line Status Blood return noted;Flushed;Saline locked 09/01/20 2221  Dressing Type Transparent;Occlusive;Securing device 09/01/20 2221  Dressing Status Clean;Dry;Intact 09/01/20 2221  Antimicrobial disc in place? Yes 09/01/20 2221  Safety Lock Not Applicable 76/18/48 5927  Line Adjustment (NICU/IV Team Only) No 09/01/20 2221  Dressing Intervention New dressing 09/01/20 2221  Dressing Change Due 09/08/20 09/01/20 2221       Edson Snowball 09/01/2020, 10:40 PM

## 2020-09-01 NOTE — Progress Notes (Signed)
PROGRESS NOTE  Joel Perez PJK:[id number] DOB: [birth year] DOA: 08/25/2020 PCP: [name], MD  HPI/Recap of past 77 hours: 75 year old M with PMH of diastolic CHF, LBBB, PAD, neuropathy, stasis dermatitis, COPD, chronic hypoxic RF on 4 L and obesity presented to ED with worsening right foot wound, and admitted for sepsis due to right fifth MTP joint osteomyelitis and infected right foot ulcer.  He was started on IV vancomycin and Zosyn.  Blood cultures NGTD.  Vascular surgery, Dr. Donnetta Hutching consulted and recommended transfer from [name] for arteriogram and further care.  Status post open right fifth toe ray amputation by Dr. Stanford Breed on 08/28/2020.  Gram positive cocci bacteremia from blood cx taken on 08/25/20.  Wound VAC placed on 08/31/2020.  Plan to discharge with wound VAC.  Infectious disease consulted for staph aureus/group B strep osteomyelitis status post fifth ray amputation.  09/01/20: Patient was seen with his wife at bedside.  Right foot pain is well controlled.  He has no new complaints.  Seen by infectious disease, plan for 4 weeks of IV Ancef.    Assessment/Plan: Principal Problem:   Osteomyelitis ([place]) Active Problems:   COPD III with marked reversibility   Chronic respiratory failure with hypoxia (HCC)   PVD (peripheral vascular disease) (HCC)   Sepsis (HCC)   MSSA/GBS osteomyelitis status post fifth ray amputation by vascular surgery Presented with severe sepsis from right foot fifth MTP joint osteomyelitis and infected right foot ulcer: POA.   Met criteria with leukocytosis, tachycardia and lactic acidosis.   Blood culture taken on 08/25/2020 + for gram-positive cocci, likely contaminant per ID.   Repeated blood culture on 08/29/2020 negative to date. History of peripheral artery disease.    De-escalate antibiotics, DC'd cefepime and IV Flagyl on 08/31/2020. IV vancomycin DC'd on 09/01/2020. -Post amputation, wound VAC application on  12/31/9831. -Local wound care and pain control per vascular surgery. Seen by infectious disease. Plan for 4 weeks of IV Ancef and follow-up in ID clinic. On IV Dilaudid as needed for severe pain Continue pain control.  Likely a contaminant, gram positive cocci isolated from anaerobic bottle 08/25/20 Likely a contaminant per ID. Repeated blood cultures negative to date.  Chronic intermittent loose stools, improving Self reported intermittent loose stools since December 2021 Continue Florastor probiotics 220 mg twice daily Afebrile, less likely infective Imodium as needed  Peripheral artery disease Continue statin Restart antiplatelet when okay with vascular surgery.  Chronic diastolic CHF  Euvolemic. TTE on 07/22/2020 with LVEF of 60 to 65%, mild LVH and indeterminate DD. -Continue home Lasix 20 mg daily -Monitor fluid and renal status while on diuretics Net I&O -4.4 L Continue strict I's and O's and daily weight.  Chronic COPD/chronic hypoxic respiratory failure:  Stable on home 4 L.   Currently at baseline. Continue home prednisone 20 mg   Followed by pulmonology. Wean off home prednisone as it may affect wound healing after surgery Prednisone dose decreased to 10 mg daily starting on 08/30/2020. -Continue home inhalers, albuterol nebulizer as needed.  -Continue supplemental oxygen.  Essential hypertension:  BP is currently stable. -Continue home Lasix and losartan  Neuropathy -Continue home gabapentin.  Class II obesity Body mass index is 35.01 kg/m. Pressure Injury 07/22/20 Buttocks Left Stage 2 -  Partial thickness loss of dermis presenting as a shallow open injury with a red, pink wound bed without slough. (Active)  07/22/20 1600  Location: Buttocks  Location Orientation: Left  Staging: Stage 2 -  Partial  thickness loss of dermis presenting as a shallow open injury with a red, pink wound bed without slough.  Wound Description (Comments):   Present on  Admission: Yes     Pressure Injury 07/23/20 Heel Right Stage 3 -  Full thickness tissue loss. Subcutaneous fat may be visible but bone, tendon or muscle are NOT exposed. (Active)  07/23/20 0800  Location: Heel  Location Orientation: Right  Staging: Stage 3 -  Full thickness tissue loss. Subcutaneous fat may be visible but bone, tendon or muscle are NOT exposed.  Wound Description (Comments):   Present on Admission: Yes   DVT prophylaxis:  SCDs Start: 08/25/20 2132/subcu heparin 3 times daily from 08/27/2020.  Code Status: Full code Family Communication: Updated his wife at bedside.  Consultants: Vascular surgery Infectious disease  Level of care: Telemetry Medical Status is: Inpatient  Remains inpatient appropriate because:Ongoing diagnostic testing needed not appropriate for outpatient work up, Unsafe d/c plan, IV treatments appropriate due to intensity of illness or inability to take PO and Inpatient level of care appropriate due to severity of illness   Dispo: The patient is from: Home  Anticipated d/c is to: Home with home health services once vascular surgery signs off.  Patient currently is not medically stable to d/c.              Difficult to place patient not applicable.       Consultants:  Vascular surgery, Dr. Donnetta Hutching          Objective: Vitals:   09/01/20 0348 09/01/20 0721 09/01/20 0852 09/01/20 1128  BP: 128/69 (!) 140/55  (!) 101/51  Pulse:  82  86  Resp: _0 Temp: 97.6 F (36.4 C) 98.4 F (36.9 C)  98.4 F (36.9 C)  TempSrc: Oral Oral  Oral  SpO2: 100% 100% 98% 98%  Weight:      Height:        Intake/Output Summary (Last 24 hours) at 09/01/2020 1749 Last data filed at 09/01/2020 1419 Gross per 24 hour  Intake 483 ml  Output 1550 ml  Net -1067 ml   Filed Weights   08/28/20 1147 08/29/20 0633 08/30/20 0338  Weight: 111 kg 105.7 kg 104.6 kg    Exam:  . General: 75 y.o. year-old male pleasant  chronically ill-appearing no acute distress.  He is alert and oriented x3.   . Cardiovascular: Regular rate and rhythm no rubs or gallops.   Marland Kitchen Respiratory: Clear to auscultation no wheezes or rales.   . Abdomen: Obese nontender normal bowel sounds present. . Musculoskeletal: Trace lower extremity edema bilaterally.  Right foot in surgical dressing.  Wound VAC in place.   . Skin: Right foot in surgical dressing.  Wound VAC in place. Marland Kitchen Psychiatry: Mood is appropriate for condition and setting.  Data Reviewed: CBC: Recent Labs  Lab 08/27/20 2113 08/28/20 0129 08/29/20 0208 08/30/20 0615 08/31/20 0034  WBC 11.9* 12.0* 10.1 11.6* 11.9*  NEUTROABS  --  8.7*  --   --   --   HGB 10.2* 10.7* 10.3* 9.9* 9.9*  HCT 32.5* 34.6* 32.6* 32.2* 31.7*  MCV 95.6 95.3 93.7 95.0 96.4  PLT 475* 444* 471* 452* 188*   Basic Metabolic Panel: Recent Labs  Lab 08/26/20 1454 08/27/20 0059 08/27/20 2113 08/28/20 0129 08/29/20 0208 08/30/20 0615  NA 141 138  --  136 138 140  K 4.0 4.1  --  3.8 4.2 3.8  CL 106 106  --  104 104 106  CO2 28 27  --  _0 GLUCOSE 160* 143*  --  76 110* 85  BUN 11 10  --  _1 CREATININE 1.02 0.97 0.91 0.79 0.83 0.87  CALCIUM 8.6* 8.3*  --  8.3* 8.5* 8.5*  MG  --  2.3  --   --  2.1  --   PHOS 1.8* 3.9  --   --  3.2  --    GFR: Estimated Creatinine Clearance: 86.1 mL/min (by C-G formula based on SCr of 0.87 mg/dL). Liver Function Tests: Recent Labs  Lab 08/26/20 1454 08/27/20 0059 08/29/20 0208  ALBUMIN 2.8* 2.5* 2.6*   No results for input(s): LIPASE, AMYLASE in the last 168 hours. No results for input(s): AMMONIA in the last 168 hours. Coagulation Profile: No results for input(s): INR, PROTIME in the last 168 hours. Cardiac Enzymes: No results for input(s): CKTOTAL, CKMB, CKMBINDEX, TROPONINI in the last 168 hours. BNP (last 3 results) No results for input(s): PROBNP in the last 8760 hours. HbA1C: No results for input(s): HGBA1C in the last 72  hours. CBG: No results for input(s): GLUCAP in the last 168 hours. Lipid Profile: No results for input(s): CHOL, HDL, LDLCALC, TRIG, CHOLHDL, LDLDIRECT in the last 72 hours. Thyroid Function Tests: No results for input(s): TSH, T4TOTAL, FREET4, T3FREE, THYROIDAB in the last 72 hours. Anemia Panel: No results for input(s): VITAMINB12, FOLATE, FERRITIN, TIBC, IRON, RETICCTPCT in the last 72 hours. Urine analysis:    Component Value Date/Time   COLORURINE YELLOW 08/25/2020 1620   APPEARANCEUR CLEAR 08/25/2020 1620   LABSPEC 1.008 08/25/2020 1620   PHURINE 8.0 08/25/2020 1620   GLUCOSEU NEGATIVE 08/25/2020 1620   HGBUR SMALL (A) 08/25/2020 1620   BILIRUBINUR NEGATIVE 08/25/2020 1620   KETONESUR NEGATIVE 08/25/2020 1620   PROTEINUR NEGATIVE 08/25/2020 1620   NITRITE NEGATIVE 08/25/2020 1620   LEUKOCYTESUR NEGATIVE 08/25/2020 1620   Sepsis Labs: _2 (procalcitonin:4,lacticidven:4)  ) Recent Results (from the past 240 hour(s))  Blood culture (routine x 2)     Status: None   Collection Time: 08/25/20  5:15 PM   Specimen: BLOOD  Result Value Ref Range Status   Specimen Description   Final    BLOOD LEFT ANTECUBITAL Performed at [facility], [place] [address], [place], [place] [address]    Special Requests   Final    BOTTLES DRAWN AEROBIC ONLY Blood Culture results may not be optimal due to an inadequate volume of blood received in culture bottles Performed at [place] [address], [place], [place] [address]    Culture   Final    NO GROWTH 5 DAYS Performed at [facility], [place] [address] [place], [place] [address]    Report Status 08/30/2020 FINAL  Final  Blood culture (routine x 2)     Status: None (Preliminary result)   Collection Time: 08/25/20  5:26 PM   Specimen: BLOOD  Result Value Ref Range Status   Specimen Description   Final    BLOOD RIGHT ANTECUBITAL Performed at [place]  [address] [place], [place] [address]    Special Requests   Final    BOTTLES DRAWN AEROBIC AND ANAEROBIC Blood Culture adequate volume Performed at [place] [address] [place], [place] [address]    Culture  Setup Time   Final    GRAM POSITIVE COCCI ANAEROBIC BOTTLE ONLY CRITICAL RESULT CALLED TO, READ BACK BY AND VERIFIED WITH: JAMWS  L [phone] FCP    Culture   Final    GRAM POSITIVE COCCI IDENTIFICATION TO FOLLOW Performed at [facility], [address] [place], [place] [address]    Report Status PENDING  Incomplete  Aerobic Culture w Gram Stain (superficial specimen)     Status: None   Collection Time: 08/25/20  5:26 PM   Specimen: Foot  Result Value Ref Range Status   Specimen Description   Final    FOOT RIGHT Performed at [facility], [address] [place], [place] [address]    Special Requests   Final    NONE Performed at [facility], [address] [place], [place] [address]    Gram Stain   Final    FEW WBC PRESENT, PREDOMINANTLY PMN ABUNDANT GRAM POSITIVE COCCI IN PAIRS IN CLUSTERS ABUNDANT GRAM NEGATIVE RODS FEW GRAM POSITIVE RODS Performed at [facility], [address] [place], [place] [address]    Culture   Final    ABUNDANT GROUP B STREP(S.AGALACTIAE)ISOLATED TESTING AGAINST S. AGALACTIAE NOT ROUTINELY PERFORMED DUE TO PREDICTABILITY OF AMP/PEN/VAN SUSCEPTIBILITY. FEW STAPHYLOCOCCUS AUREUS    Report Status 08/29/2020 FINAL  Final   Organism ID, Bacteria STAPHYLOCOCCUS AUREUS  Final      Susceptibility   Staphylococcus aureus - MIC*    CIPROFLOXACIN <=0.5 SENSITIVE Sensitive     ERYTHROMYCIN >=8 RESISTANT Resistant     GENTAMICIN <=0.5 SENSITIVE Sensitive     OXACILLIN <=0.25 SENSITIVE Sensitive     TETRACYCLINE <=1 SENSITIVE Sensitive     VANCOMYCIN <=0.5 SENSITIVE Sensitive     TRIMETH/SULFA <=10 SENSITIVE Sensitive     CLINDAMYCIN >=8 RESISTANT Resistant      RIFAMPIN <=0.5 SENSITIVE Sensitive     Inducible Clindamycin NEGATIVE Sensitive     * FEW STAPHYLOCOCCUS AUREUS  Blood Culture ID Panel (Reflexed)     Status: None   Collection Time: 08/25/20  5:26 PM  Result Value Ref Range Status   Enterococcus faecalis NOT DETECTED NOT DETECTED Final   Enterococcus Faecium NOT DETECTED NOT DETECTED Final   Listeria monocytogenes NOT DETECTED NOT DETECTED Final   Staphylococcus species NOT DETECTED NOT DETECTED Final   Staphylococcus aureus (BCID) NOT DETECTED NOT DETECTED Final   Staphylococcus epidermidis NOT DETECTED NOT DETECTED Final   Staphylococcus lugdunensis NOT DETECTED NOT DETECTED Final   Streptococcus species NOT DETECTED NOT DETECTED Final   Streptococcus agalactiae NOT DETECTED NOT DETECTED Final   Streptococcus pneumoniae NOT DETECTED NOT DETECTED Final   Streptococcus pyogenes NOT DETECTED NOT DETECTED Final   A.calcoaceticus-baumannii NOT DETECTED NOT DETECTED Final   Bacteroides fragilis NOT DETECTED NOT DETECTED Final   Enterobacterales NOT DETECTED NOT DETECTED Final   Enterobacter cloacae complex NOT DETECTED NOT DETECTED Final   Escherichia coli NOT DETECTED NOT DETECTED Final   Klebsiella aerogenes NOT DETECTED NOT DETECTED Final   Klebsiella oxytoca NOT DETECTED NOT DETECTED Final   Klebsiella pneumoniae NOT DETECTED NOT DETECTED Final   Proteus species NOT DETECTED NOT DETECTED Final   Salmonella species NOT DETECTED NOT DETECTED Final   Serratia marcescens NOT DETECTED NOT DETECTED Final   Haemophilus influenzae NOT DETECTED NOT DETECTED Final   Neisseria meningitidis NOT DETECTED NOT DETECTED Final   Pseudomonas aeruginosa NOT DETECTED NOT DETECTED Final   Stenotrophomonas maltophilia NOT DETECTED NOT DETECTED Final   Candida albicans NOT DETECTED NOT DETECTED Final   Candida auris NOT DETECTED NOT DETECTED Final   Candida glabrata NOT DETECTED   NOT DETECTED Final   Candida krusei NOT DETECTED NOT DETECTED Final    Candida parapsilosis NOT DETECTED NOT DETECTED Final   Candida tropicalis NOT DETECTED NOT DETECTED Final   Cryptococcus neoformans/gattii NOT DETECTED NOT DETECTED Final    Comment: Performed at [facility], [place] [address] [place], [place] [address]  Resp Panel by RT-PCR (Flu A&B, Covid) Nasopharyngeal Swab     Status: None   Collection Time: 08/25/20  6:43 PM   Specimen: Nasopharyngeal Swab; Nasopharyngeal(NP) swabs in vial transport medium  Result Value Ref Range Status   SARS Coronavirus 2 by RT PCR NEGATIVE NEGATIVE Final    Comment: (NOTE) SARS-CoV-2 target nucleic acids are NOT DETECTED.  The SARS-CoV-2 RNA is generally detectable in upper respiratory specimens during the acute phase of infection. The lowest concentration of SARS-CoV-2 viral copies this assay can detect is 138 copies/mL. A negative result does not preclude SARS-Cov-2 infection and should not be used as the sole basis for treatment or other patient management decisions. A negative result may occur with  improper specimen collection/handling, submission of specimen other than nasopharyngeal swab, presence of viral mutation(s) within the areas targeted by this assay, and inadequate number of viral copies(<138 copies/mL). A negative result must be combined with clinical observations, patient history, and epidemiological information. The expected result is Negative.  Fact Sheet for Patients:  EntrepreneurPulse.com.au  Fact Sheet for Healthcare Providers:  IncredibleEmployment.be  This test is no t yet approved or cleared by the [place] FDA and  has been authorized for detection and/or diagnosis of SARS-CoV-2 by FDA under an Emergency Use Authorization (EUA). This EUA will remain  in effect (meaning this test can be used) for the duration of the COVID-19 declaration under Section 564(b)(1) of the Act, 21 U.S.C.section 360bbb-3(b)(1), unless the authorization is  terminated  or revoked sooner.       Influenza A by PCR NEGATIVE NEGATIVE Final   Influenza B by PCR NEGATIVE NEGATIVE Final    Comment: (NOTE) The Xpert Xpress SARS-CoV-2/FLU/RSV plus assay is intended as an aid in the diagnosis of influenza from Nasopharyngeal swab specimens and should not be used as a sole basis for treatment. Nasal washings and aspirates are unacceptable for Xpert Xpress SARS-CoV-2/FLU/RSV testing.  Fact Sheet for Patients: EntrepreneurPulse.com.au  Fact Sheet for Healthcare Providers: IncredibleEmployment.be  This test is not yet approved or cleared by the [place] FDA and has been authorized for detection and/or diagnosis of SARS-CoV-2 by FDA under an Emergency Use Authorization (EUA). This EUA will remain in effect (meaning this test can be used) for the duration of the COVID-19 declaration under Section 564(b)(1) of the Act, 21 U.S.C. section 360bbb-3(b)(1), unless the authorization is terminated or revoked.  Performed at [facility], [place] [address] [place], [place] [address]   Surgical PCR screen     Status: Abnormal   Collection Time: 08/28/20 10:47 AM   Specimen: Nasal Mucosa; Nasal Swab  Result Value Ref Range Status   MRSA, PCR NEGATIVE NEGATIVE Final   Staphylococcus aureus POSITIVE (A) NEGATIVE Final    Comment: (NOTE) The Xpert SA Assay (FDA approved for NASAL specimens in patients 54 years of age and older), is one component of a comprehensive surveillance program. It is not intended to diagnose infection nor to guide or monitor treatment. Performed at [facility], [place] [address], [place], [place] [address]   Aerobic/Anaerobic Culture w Gram Stain (surgical/deep wound)     Status: None (Preliminary  result)   Collection Time: 08/28/20  1:10 PM   Specimen: Toe, Right; Amputation  Result Value Ref Range Status   Specimen Description TOE  Final   Special Requests RIGHT  FIFTH TOE  Final   Gram Stain   Final    ABUNDANT WBC PRESENT,BOTH PMN AND MONONUCLEAR RARE GRAM POSITIVE COCCI Performed at [facility], [address] [place], [place] [address]    Culture   Final    RARE STAPHYLOCOCCUS AUREUS RARE GROUP B STREP(S.AGALACTIAE)ISOLATED TESTING AGAINST S. AGALACTIAE NOT ROUTINELY PERFORMED DUE TO PREDICTABILITY OF AMP/PEN/VAN SUSCEPTIBILITY. NO ANAEROBES ISOLATED; CULTURE IN PROGRESS FOR 5 DAYS    Report Status PENDING  Incomplete   Organism ID, Bacteria STAPHYLOCOCCUS AUREUS  Final      Susceptibility   Staphylococcus aureus - MIC*    CIPROFLOXACIN <=0.5 SENSITIVE Sensitive     ERYTHROMYCIN >=8 RESISTANT Resistant     GENTAMICIN <=0.5 SENSITIVE Sensitive     OXACILLIN 0.5 SENSITIVE Sensitive     TETRACYCLINE <=1 SENSITIVE Sensitive     VANCOMYCIN 1 SENSITIVE Sensitive     TRIMETH/SULFA <=10 SENSITIVE Sensitive     CLINDAMYCIN >=8 RESISTANT Resistant     RIFAMPIN <=0.5 SENSITIVE Sensitive     Inducible Clindamycin NEGATIVE Sensitive     * RARE STAPHYLOCOCCUS AUREUS  Culture, blood (routine x 2)     Status: None (Preliminary result)   Collection Time: 08/29/20  6:23 AM   Specimen: BLOOD RIGHT HAND  Result Value Ref Range Status   Specimen Description BLOOD RIGHT HAND  Final   Special Requests   Final    BOTTLES DRAWN AEROBIC AND ANAEROBIC Blood Culture results may not be optimal due to an inadequate volume of blood received in culture bottles   Culture   Final    NO GROWTH 3 DAYS Performed at [facility], [address] [place], [place] [address]    Report Status PENDING  Incomplete  Culture, blood (routine x 2)     Status: None (Preliminary result)   Collection Time: 08/29/20  6:27 AM   Specimen: BLOOD RIGHT HAND  Result Value Ref Range Status   Specimen Description BLOOD RIGHT HAND  Final   Special Requests   Final    BOTTLES DRAWN AEROBIC ONLY Blood Culture results may not be optimal due to an inadequate volume of blood  received in culture bottles   Culture   Final    NO GROWTH 3 DAYS Performed at [facility], [address] [place], [place] [address]    Report Status PENDING  Incomplete      Studies: US EKG SITE RITE  Result Date: 09/01/2020 If Site Rite image not attached, placement could not be confirmed due to current cardiac rhythm.   Scheduled Meds: . aspirin EC  81 mg Oral Daily  . atorvastatin  40 mg Oral Daily  . fluticasone furoate-vilanterol  1 puff Inhalation Daily  . furosemide  20 mg Oral Daily  . gabapentin  600 mg Oral Q8H  . heparin  5,000 Units Subcutaneous Q8H  . losartan  50 mg Oral Daily  . mupirocin ointment  1 application Nasal BID  . predniSONE  10 mg Oral Q breakfast  . saccharomyces boulardii  250 mg Oral BID  . sodium chloride flush  3 mL Intravenous Q12H  . umeclidinium bromide  1 puff Inhalation Daily    Continuous Infusions: . sodium chloride    .  ceFAZolin (ANCEF) IV 2   g (09/01/20 1419)  . lactated ringers 10 mL/hr at 08/28/20 1148     LOS: 7 days      N , MD [facility] Pager [phone]  If 7PM-7AM, please contact night-coverage [email or web address] Password [email or web address] 09/01/2020, 5:49 PM   

## 2020-09-01 NOTE — Consult Note (Signed)
[facility] for Infectious Disease    Date of Admission:  08/25/2020     Total days of antibiotics 8       Reason for Consult: Osteomyelitis    Referring Provider: [place] Crane Primary Care Provider: [name], MD   ASSESSMENT:  Joel Perez is a 75 y/o male with osteomyelitis of the right fifth ray s/p amputation with application of wound VAC. Cultures positive for MSSA and Group B streptococcus. Recommend 4 weeks of antibiotic therapy with placement of PICC line. Change antibiotics to Cefazolin. Continue wound care per orthopedics recommendations. Home Health and OPAT placed and will arrange follow up in ID office.   PLAN:  1. Narrow antibiotics to Cefazolin. 2. Wound care per Vascular Surgery 3. PICC line placement.  4. Will arrange follow up in ID office or through telehealth.   Diagnosis: Osteomyelitis   Culture Result: MSSA and Group B Streptococcus  Allergies  Allergen Reactions  . Other Swelling and Other (See Comments)    Farmed Fish (tightness in throat & lip swelling)  . Codeine Rash  . Sulfa Antibiotics Hives    OPAT Orders Discharge antibiotics to be given via PICC line Discharge antibiotics: Cefazolin Per pharmacy protocol  Aim for Vancomycin trough 15-20 or AUC 400-550 (unless otherwise indicated)  Duration: 4 weeks  End Date: 09/25/20  [facility] Care Per Protocol:  Home health RN for IV administration and teaching; PICC line care and labs.    Labs weekly while on IV antibiotics: _X_ CBC with differential _X_ BMP __ CMP _X_ CRP _X_ ESR __ Vancomycin trough __ CK  _X_ Please pull PIC at completion of IV antibiotics __ Please leave PIC in place until doctor has seen patient or been notified  Fax weekly labs to [phone]  Clinic Follow Up Appt:  09/16/20 at 10:30am with Terri Piedra, NP   Principal Problem:   Osteomyelitis [facility]) Active Problems:   COPD III with marked reversibility   Chronic respiratory failure with hypoxia ([place])    PVD (peripheral vascular disease) ([place])   Sepsis ([place])   . aspirin EC  81 mg Oral Daily  . atorvastatin  40 mg Oral Daily  . fluticasone furoate-vilanterol  1 puff Inhalation Daily  . furosemide  20 mg Oral Daily  . gabapentin  600 mg Oral Q8H  . heparin  5,000 Units Subcutaneous Q8H  . losartan  50 mg Oral Daily  . mupirocin ointment  1 application Nasal BID  . predniSONE  10 mg Oral Q breakfast  . saccharomyces boulardii  250 mg Oral BID  . sodium chloride flush  3 mL Intravenous Q12H  . umeclidinium bromide  1 puff Inhalation Daily     HPI: Joel Perez is a 75 y.o. male with previous medical history significant for chronic diastolic heart failure, IVCD with LBBB, chronic venous insufficieny with stasis dermatitis, PAD, COPD on chronic O2 at 4L, and neuropathy admitted for worsening right wound.   Afebrile and tachycardic in the ED. WBC count of 15.8.  X-ray right foot concerning for osteomyelitis at the fifth MTP joint. ABIs demonstrated 0.7 bilaterally with monophasic waveforms at the ankle. Underwent right toe fifth ray amputation with application of wound vac on 4/7. Surgical specimens with gram positive cocci on gram stain and culture with MSSA and Group B streptococcus. Blood cultures have remained without growth since admission.    Review of Systems: Review of Systems  Constitutional: Negative for chills, fever and weight loss.  Respiratory: Negative  for cough, shortness of breath and wheezing.   Cardiovascular: Negative for chest pain and leg swelling.  Gastrointestinal: Negative for abdominal pain, constipation, diarrhea, nausea and vomiting.  Skin: Negative for rash.     Past Medical History:  Diagnosis Date  . 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 BPH (benign prostatic hyperplasia)   . Capsular cataract of left eye   . COPD (chronic obstructive pulmonary disease) (HCC)    Dr. [name]  . Current smoker    Long-term smoker. Not interested in quitting   . Diverticulosis    With intermittent diverticulitis  . Erectile dysfunction   . Essential hypertension    Several recorded blood pressures greater than 140/90.  . H/O mitral valve prolapse    By report, but not confirmed by echo.  . Low back pain    Chronic. Followed by Dr. Suella Broad  . Mild aortic regurgitation 12/2013   Mild to moderate aortic regurgitation on echo  . Neuropathy     Social History   Tobacco Use  . Smoking status: Former Smoker    Packs/day: 2.00    Years: 50.00    Pack years: 100.00    Types: Cigarettes    Quit date: 03/16/2018    Years since quitting: 2.4  . Smokeless tobacco: Never Used  Vaping Use  . Vaping Use: Some days  . Start date: 03/16/2018  Substance Use Topics  . Alcohol use: Yes    Alcohol/week: 0.0 standard drinks  . Drug use: No    Family History  Problem Relation Age of Onset  . Heart disease Father   . Breast cancer Mother   . Other Sister   . Brain cancer Brother     Allergies  Allergen Reactions  . Other Swelling and Other (See Comments)    Farmed Fish (tightness in throat & lip swelling)  . Codeine Rash  . Sulfa Antibiotics Hives    OBJECTIVE: Blood pressure (!) 101/51, pulse 86, temperature 98.4 F (36.9 C), temperature source Oral, resp. rate 13, height _0  (1.778 m), weight 104.6 kg, SpO2 98 %.  Physical Exam Constitutional:      General: He is not in acute distress.    Appearance: He is well-developed.  Cardiovascular:     Rate and Rhythm: Normal rate and regular rhythm.     Heart sounds: Normal heart sounds.  Pulmonary:     Effort: Pulmonary effort is normal.     Breath sounds: Normal breath sounds.  Skin:    General: Skin is warm and dry.  Neurological:     Mental Status: He is alert.  Psychiatric:        Mood and Affect: Mood normal.     Lab Results Lab Results  Component Value Date   WBC 11.9 (H) 08/31/2020   HGB 9.9 (L) 08/31/2020   HCT 31.7 (L) 08/31/2020   MCV 96.4 08/31/2020    PLT 442 (H) 08/31/2020    Lab Results  Component Value Date   CREATININE 0.87 08/30/2020   BUN 11 08/30/2020   NA 140 08/30/2020   K 3.8 08/30/2020   CL 106 08/30/2020   CO2 30 08/30/2020    Lab Results  Component Value Date   ALT 21 08/25/2020   AST 27 08/25/2020   ALKPHOS 64 08/25/2020   BILITOT 0.7 08/25/2020     Microbiology: Recent Results (from the past 240 hour(s))  Blood culture (routine x 2)     Status:  None   Collection Time: 08/25/20  5:15 PM   Specimen: BLOOD  Result Value Ref Range Status   Specimen Description   Final    BLOOD LEFT ANTECUBITAL Performed at [facility] [address], [place], [place] [address]    Special Requests   Final    BOTTLES DRAWN AEROBIC ONLY Blood Culture results may not be optimal due to an inadequate volume of blood received in culture bottles Performed at [facility] [address] [place], [place] [address]    Culture   Final    NO GROWTH 5 DAYS Performed at [facility], [place] [address] [place], [place] [address]    Report Status 08/30/2020 FINAL  Final  Blood culture (routine x 2)     Status: None (Preliminary result)   Collection Time: 08/25/20  5:26 PM   Specimen: BLOOD  Result Value Ref Range Status   Specimen Description   Final    BLOOD RIGHT ANTECUBITAL Performed at [facility] [address] [place], [place] [address]    Special Requests   Final    BOTTLES DRAWN AEROBIC AND ANAEROBIC Blood Culture adequate volume Performed at [facility] [address], [place], [place] [address]    Culture  Setup Time   Final    GRAM POSITIVE COCCI ANAEROBIC BOTTLE ONLY CRITICAL RESULT CALLED TO, READ BACK BY AND VERIFIED WITH: JAMWS L [id number] FCP    Culture   Final    GRAM POSITIVE COCCI IDENTIFICATION TO FOLLOW Performed at [facility], [place] [address], [place], [place] [address]    Report Status PENDING  Incomplete   Aerobic Culture w Gram Stain (superficial specimen)     Status: None   Collection Time: 08/25/20  5:26 PM   Specimen: Foot  Result Value Ref Range Status   Specimen Description   Final    FOOT RIGHT Performed at [facility] [address] [place], [place] [address]    Special Requests   Final    NONE Performed at [facility], [place] [address] [place], [place] [address]    Gram Stain   Final    FEW WBC PRESENT, PREDOMINANTLY PMN ABUNDANT GRAM POSITIVE COCCI IN PAIRS IN CLUSTERS ABUNDANT GRAM NEGATIVE RODS FEW GRAM POSITIVE RODS Performed at [facility], [place] [address] [place], [place] [address]    Culture   Final    ABUNDANT GROUP B STREP(S.AGALACTIAE)ISOLATED TESTING AGAINST S. AGALACTIAE NOT ROUTINELY PERFORMED DUE TO PREDICTABILITY OF AMP/PEN/VAN SUSCEPTIBILITY. FEW STAPHYLOCOCCUS AUREUS    Report Status 08/29/2020 FINAL  Final   Organism ID, Bacteria STAPHYLOCOCCUS AUREUS  Final      Susceptibility   Staphylococcus aureus - MIC*    CIPROFLOXACIN <=0.5 SENSITIVE Sensitive     ERYTHROMYCIN >=8 RESISTANT Resistant     GENTAMICIN <=0.5 SENSITIVE Sensitive     OXACILLIN <=0.25 SENSITIVE Sensitive     TETRACYCLINE <=1 SENSITIVE Sensitive     VANCOMYCIN <=0.5 SENSITIVE Sensitive     TRIMETH/SULFA <=10 SENSITIVE Sensitive     CLINDAMYCIN >=8 RESISTANT Resistant     RIFAMPIN <=0.5 SENSITIVE Sensitive     Inducible Clindamycin NEGATIVE Sensitive     * FEW STAPHYLOCOCCUS AUREUS  Blood Culture ID Panel (Reflexed)     Status: None   Collection Time: 08/25/20  5:26 PM  Result Value Ref Range Status   Enterococcus faecalis NOT DETECTED NOT DETECTED Final  Enterococcus Faecium NOT DETECTED NOT DETECTED Final   Listeria monocytogenes NOT DETECTED NOT DETECTED Final   Staphylococcus species NOT DETECTED NOT DETECTED Final   Staphylococcus aureus (BCID) NOT DETECTED NOT DETECTED Final   Staphylococcus epidermidis NOT DETECTED NOT  DETECTED Final   Staphylococcus lugdunensis NOT DETECTED NOT DETECTED Final   Streptococcus species NOT DETECTED NOT DETECTED Final   Streptococcus agalactiae NOT DETECTED NOT DETECTED Final   Streptococcus pneumoniae NOT DETECTED NOT DETECTED Final   Streptococcus pyogenes NOT DETECTED NOT DETECTED Final   A.calcoaceticus-baumannii NOT DETECTED NOT DETECTED Final   Bacteroides fragilis NOT DETECTED NOT DETECTED Final   Enterobacterales NOT DETECTED NOT DETECTED Final   Enterobacter cloacae complex NOT DETECTED NOT DETECTED Final   Escherichia coli NOT DETECTED NOT DETECTED Final   Klebsiella aerogenes NOT DETECTED NOT DETECTED Final   Klebsiella oxytoca NOT DETECTED NOT DETECTED Final   Klebsiella pneumoniae NOT DETECTED NOT DETECTED Final   Proteus species NOT DETECTED NOT DETECTED Final   Salmonella species NOT DETECTED NOT DETECTED Final   Serratia marcescens NOT DETECTED NOT DETECTED Final   Haemophilus influenzae NOT DETECTED NOT DETECTED Final   Neisseria meningitidis NOT DETECTED NOT DETECTED Final   Pseudomonas aeruginosa NOT DETECTED NOT DETECTED Final   Stenotrophomonas maltophilia NOT DETECTED NOT DETECTED Final   Candida albicans NOT DETECTED NOT DETECTED Final   Candida auris NOT DETECTED NOT DETECTED Final   Candida glabrata NOT DETECTED NOT DETECTED Final   Candida krusei NOT DETECTED NOT DETECTED Final   Candida parapsilosis NOT DETECTED NOT DETECTED Final   Candida tropicalis NOT DETECTED NOT DETECTED Final   Cryptococcus neoformans/gattii NOT DETECTED NOT DETECTED Final    Comment: Performed at [facility], [address] [place], [place] [address]  Resp Panel by RT-PCR (Flu A&B, Covid) Nasopharyngeal Swab     Status: None   Collection Time: 08/25/20  6:43 PM   Specimen: Nasopharyngeal Swab; Nasopharyngeal(NP) swabs in vial transport medium  Result Value Ref Range Status   SARS Coronavirus 2 by RT PCR NEGATIVE NEGATIVE Final    Comment: (NOTE) SARS-CoV-2  target nucleic acids are NOT DETECTED.  The SARS-CoV-2 RNA is generally detectable in upper respiratory specimens during the acute phase of infection. The lowest concentration of SARS-CoV-2 viral copies this assay can detect is 138 copies/mL. A negative result does not preclude SARS-Cov-2 infection and should not be used as the sole basis for treatment or other patient management decisions. A negative result may occur with  improper specimen collection/handling, submission of specimen other than nasopharyngeal swab, presence of viral mutation(s) within the areas targeted by this assay, and inadequate number of viral copies(<138 copies/mL). A negative result must be combined with clinical observations, patient history, and epidemiological information. The expected result is Negative.  Fact Sheet for Patients:  EntrepreneurPulse.com.au  Fact Sheet for Healthcare Providers:  IncredibleEmployment.be  This test is no t yet approved or cleared by the [place] FDA and  has been authorized for detection and/or diagnosis of SARS-CoV-2 by FDA under an Emergency Use Authorization (EUA). This EUA will remain  in effect (meaning this test can be used) for the duration of the COVID-19 declaration under Section 564(b)(1) of the Act, 21 U.S.C.section 360bbb-3(b)(1), unless the authorization is terminated  or revoked sooner.       Influenza A by PCR NEGATIVE NEGATIVE Final   Influenza B by PCR NEGATIVE NEGATIVE Final    Comment: (NOTE) The Xpert Xpress SARS-CoV-2/FLU/RSV plus assay is intended  as an aid in the diagnosis of influenza from Nasopharyngeal swab specimens and should not be used as a sole basis for treatment. Nasal washings and aspirates are unacceptable for Xpert Xpress SARS-CoV-2/FLU/RSV testing.  Fact Sheet for Patients: EntrepreneurPulse.com.au  Fact Sheet for Healthcare  Providers: IncredibleEmployment.be  This test is not yet approved or cleared by the [place] FDA and has been authorized for detection and/or diagnosis of SARS-CoV-2 by FDA under an Emergency Use Authorization (EUA). This EUA will remain in effect (meaning this test can be used) for the duration of the COVID-19 declaration under Section 564(b)(1) of the Act, 21 U.S.C. section 360bbb-3(b)(1), unless the authorization is terminated or revoked.  Performed at [facility], [place] [address], [place], [place] [address]   Surgical PCR screen     Status: Abnormal   Collection Time: 08/28/20 10:47 AM   Specimen: Nasal Mucosa; Nasal Swab  Result Value Ref Range Status   MRSA, PCR NEGATIVE NEGATIVE Final   Staphylococcus aureus POSITIVE (A) NEGATIVE Final    Comment: (NOTE) The Xpert SA Assay (FDA approved for NASAL specimens in patients 40 years of age and older), is one component of a comprehensive surveillance program. It is not intended to diagnose infection nor to guide or monitor treatment. Performed at [facility], [place] [address], [place], [place] [address]   Aerobic/Anaerobic Culture w Gram Stain (surgical/deep wound)     Status: None (Preliminary result)   Collection Time: 08/28/20  1:10 PM   Specimen: Toe, Right; Amputation  Result Value Ref Range Status   Specimen Description TOE  Final   Special Requests RIGHT FIFTH TOE  Final   Gram Stain   Final    ABUNDANT WBC PRESENT,BOTH PMN AND MONONUCLEAR RARE GRAM POSITIVE COCCI Performed at [facility], [place] [address] [place], [place] [address]    Culture   Final    RARE STAPHYLOCOCCUS AUREUS RARE GROUP B STREP(S.AGALACTIAE)ISOLATED TESTING AGAINST S. AGALACTIAE NOT ROUTINELY PERFORMED DUE TO PREDICTABILITY OF AMP/PEN/VAN SUSCEPTIBILITY. NO ANAEROBES ISOLATED; CULTURE IN PROGRESS FOR 5 DAYS    Report Status PENDING  Incomplete   Organism ID, Bacteria STAPHYLOCOCCUS AUREUS   Final      Susceptibility   Staphylococcus aureus - MIC*    CIPROFLOXACIN <=0.5 SENSITIVE Sensitive     ERYTHROMYCIN >=8 RESISTANT Resistant     GENTAMICIN <=0.5 SENSITIVE Sensitive     OXACILLIN 0.5 SENSITIVE Sensitive     TETRACYCLINE <=1 SENSITIVE Sensitive     VANCOMYCIN 1 SENSITIVE Sensitive     TRIMETH/SULFA <=10 SENSITIVE Sensitive     CLINDAMYCIN >=8 RESISTANT Resistant     RIFAMPIN <=0.5 SENSITIVE Sensitive     Inducible Clindamycin NEGATIVE Sensitive     * RARE STAPHYLOCOCCUS AUREUS  Culture, blood (routine x 2)     Status: None (Preliminary result)   Collection Time: 08/29/20  6:23 AM   Specimen: BLOOD RIGHT HAND  Result Value Ref Range Status   Specimen Description BLOOD RIGHT HAND  Final   Special Requests   Final    BOTTLES DRAWN AEROBIC AND ANAEROBIC Blood Culture results may not be optimal due to an inadequate volume of blood received in culture bottles   Culture   Final    NO GROWTH 3 DAYS Performed at [facility], [place] [address], [place], [place] [address]    Report Status PENDING  Incomplete  Culture, blood (routine x 2)     Status: None (Preliminary result)  Collection Time: 08/29/20  6:27 AM   Specimen: BLOOD RIGHT HAND  Result Value Ref Range Status   Specimen Description BLOOD RIGHT HAND  Final   Special Requests   Final    BOTTLES DRAWN AEROBIC ONLY Blood Culture results may not be optimal due to an inadequate volume of blood received in culture bottles   Culture   Final    NO GROWTH 3 DAYS Performed at [facility], [place] [address], [place], [place] [address]    Report Status PENDING  Incomplete     Terri Piedra, [place] for Infectious Disease [facility]  09/01/2020  1:27 PM

## 2020-09-01 NOTE — Progress Notes (Signed)
Mobility Specialist: Progress Note   09/01/20 1159  Mobility  Activity Ambulated in hall  Level of Assistance Minimal assist, patient does 75% or more  Assistive Device Front wheel walker  Distance Ambulated (ft) 120 ft  Mobility Response Tolerated well  Mobility performed by Mobility specialist  Bed Position Chair  $Mobility charge 1 Mobility   Pre-Mobility: 88 HR, 101/51 BP During Mobility:            On 4 L/min [place]: 86% SpO2           On 6 L/min [place]: 92% SpO2 Post-Mobility on 6 L/min [place]: 113 HR, 110/58 BP, 93% SpO2  Pt ambulated on 4 L/min [place]. Pt stopped for short standing break due to feeling SOB, sats 86% at this time. O2 flow increased to 6 L/min and pt coached through pursed lip breathing, recovered quickly. Pt to [facility] after walk, able to urinate but no BM. Pt back to chair on 4 L/min [place]. Pt otherwise asx.   [facility] Lyra Alaimo Mobility Specialist Mobility Specialist Phone: [phone]

## 2020-09-01 NOTE — Progress Notes (Signed)
Occupational Therapy Treatment Patient Details Name: Joel Perez MRN: [id number] DOB: 1945-07-26 Today's Date: 09/01/2020    History of present illness pt is a 75 y/o male admitted 4/4 with worsening R foot wound.  Found severe sepsis from R foo fifth MTP jt osteomyelitis and infected R foot ulcer. s/p open right fifth toe amputation.  PMHx:  neuropathy, HTN, COPD, blindness R eye   OT comments  Patient continues to make steady progress towards goals in skilled OT session. Patient's session encompassed functional mobility, self care activities, and brief education with regard to energy conservation. Pt minimally flat upon entry, however agreeable to work with therapy. Pt continues to present with dizziness with transitions (140/61 when sitting EOB, unable to obtain follow up BP in chair due to provider entry). Pt session limited due to dizziness, but willing to complete ADLs with set up in recliner. Educated on the importance of increased movement to wife and pt, with verbalization noted. Discharge remains appropriate, therapy will continue to follow in house.    Follow Up Recommendations  No OT follow up    Equipment Recommendations  None recommended by OT    Recommendations for Other Services      Precautions / Restrictions Precautions Precautions: Fall Required Braces or Orthoses: Other Brace Other Brace: DARCO Shoe Restrictions Weight Bearing Restrictions: No RLE Weight Bearing: Weight bearing as tolerated       Mobility Bed Mobility Overal bed mobility: Needs Assistance Bed Mobility: Sit to Supine     Supine to sit: Supervision     General bed mobility comments: minimal HOB raised, no rail, no assist    Transfers Overall transfer level: Needs assistance Equipment used: Rolling walker (2 wheeled) Transfers: Sit to/from [facility] Sit to Stand: Min assist Stand pivot transfers: Min assist       General transfer comment: cues for hand  placement/safety, guard for safety and mild instability caused by [facility], requiring use of bed rail to come into standing x2    Balance Overall balance assessment: Needs assistance Sitting-balance support: No upper extremity supported Sitting balance-Leahy Scale: Good     Standing balance support: Single extremity supported Standing balance-Leahy Scale: Poor Standing balance comment: reliant on AD due to pain and darco shoe.                           ADL either performed or assessed with clinical judgement   ADL Overall ADL's : Needs assistance/impaired     Grooming: Set up;Sitting;Wash/dry face;Wash/dry hands;Oral care           Upper Body Dressing : Set up;Sitting   Lower Body Dressing: Moderate assistance;Sit to/from stand   Toilet Transfer: Minimal assistance;RW;Cueing for sequencing;Cueing for safety;Stand-pivot Toilet Transfer Details (indicate cue type and reason): simulated to recliner, cues for controlled eccentric descend and often stepping on O2 line despite management         Functional mobility during ADLs: Minimal assistance;Rolling walker;Cueing for sequencing;Cueing for safety General ADL Comments: limited to stand pivot transfers due to dizziness and difficulty walking with darco shoe as well as multiple providers rounding during session     Vision       Perception     Praxis      Cognition Arousal/Alertness: Awake/alert Behavior During Therapy: WFL for tasks assessed/performed Overall Cognitive Status: Within Functional Limits for tasks assessed  General Comments: wife states pt is depressed, abrasive at times, but softens and uses humor as defense mechanism        Exercises     Shoulder Instructions       General Comments      Pertinent Vitals/ Pain       Pain Assessment: Faces Faces Pain Scale: Hurts little more Pain Location: R foot Pain Descriptors / Indicators:  Discomfort;Guarding;Sore Pain Intervention(s): Limited activity within patient's tolerance;Monitored during session;Premedicated before session;Repositioned;Patient requesting pain meds-RN notified;Relaxation  Home Living                                          Prior Functioning/Environment              Frequency  Min 2X/week        Progress Toward Goals  OT Goals(current goals can now be found in the care plan section)  Progress towards OT goals: Progressing toward goals  Acute Rehab OT Goals Patient Stated Goal: Be able to get around the house and outside down the ramp. OT Goal Formulation: With patient Time For Goal Achievement: 09/12/20 Potential to Achieve Goals: Good  Plan Discharge plan remains appropriate    Co-evaluation                 AM-PAC OT "6 Clicks" Daily Activity     Outcome Measure   Help from another person eating meals?: None Help from another person taking care of personal grooming?: None Help from another person toileting, which includes using toliet, bedpan, or urinal?: A Little Help from another person bathing (including washing, rinsing, drying)?: A Little Help from another person to put on and taking off regular upper body clothing?: None Help from another person to put on and taking off regular lower body clothing?: A Lot 6 Click Score: 20    End of Session Equipment Utilized During Treatment: Oxygen;Rolling walker;Other (comment) (DARCO shoe)  OT Visit Diagnosis: Unsteadiness on feet (R26.81);Pain Pain - Right/Left: Right Pain - part of body: Ankle and joints of foot   Activity Tolerance Patient limited by pain;Other (comment) (Limited due to dizziness VSS)   Patient Left in bed;with call bell/phone within reach;with family/visitor present   Nurse Communication Mobility status;Other (comment) (urine output, pain medication)        Time: 8938-1017 OT Time Calculation (min): 35 min  Charges: OT  General Charges $OT Visit: 1 Visit OT Treatments $Self Care/Home Management : 23-37 mins  [place]. [place], [place] [facility] [place] 09/01/2020, 10:55 AM

## 2020-09-02 LAB — CBC
HCT: 32.6 % — ABNORMAL LOW (ref 39.0–52.0)
Hemoglobin: 10.2 g/dL — ABNORMAL LOW (ref 13.0–17.0)
MCH: 29.9 pg (ref 26.0–34.0)
MCHC: 31.3 g/dL (ref 30.0–36.0)
MCV: 95.6 fL (ref 80.0–100.0)
Platelets: 433 10*3/uL — ABNORMAL HIGH (ref 150–400)
RBC: 3.41 MIL/uL — ABNORMAL LOW (ref 4.22–5.81)
RDW: 14.7 % (ref 11.5–15.5)
WBC: 10.1 10*3/uL (ref 4.0–10.5)
nRBC: 0 % (ref 0.0–0.2)

## 2020-09-02 LAB — BASIC METABOLIC PANEL
Anion gap: 6 (ref 5–15)
BUN: 8 mg/dL (ref 8–23)
CO2: 29 mmol/L (ref 22–32)
Calcium: 8.5 mg/dL — ABNORMAL LOW (ref 8.9–10.3)
Chloride: 104 mmol/L (ref 98–111)
Creatinine, Ser: 0.69 mg/dL (ref 0.61–1.24)
GFR, Estimated: >60 mL/min (ref >60)
Glucose, Bld: 79 mg/dL (ref 70–99)
Potassium: 3.6 mmol/L (ref 3.5–5.1)
Sodium: 139 mmol/L (ref 135–145)

## 2020-09-02 LAB — AEROBIC/ANAEROBIC CULTURE W GRAM STAIN (SURGICAL/DEEP WOUND)

## 2020-09-02 NOTE — Progress Notes (Signed)
  Progress Note    09/02/2020 7:29 AM 5 Days Post-Op  Subjective:  Says pain is under better control  afebrile  Vitals:   09/01/20 2302 09/02/20 0345  BP: (!) 148/78 (!) 131/58  Pulse: 68 73  Resp: 15 16  Temp: 98.3 F (36.8 C) 98.3 F (36.8 C)  SpO2: 99% 97%    Physical Exam: General:  No distress-resting comfortably Lungs:  Non labored Incisions:  Vac in place Extremities:  Toes on right foot warm and motor in tact   CBC    Component Value Date/Time   WBC 10.1 09/02/2020 0348   RBC 3.41 (L) 09/02/2020 0348   HGB 10.2 (L) 09/02/2020 0348   HCT 32.6 (L) 09/02/2020 0348   PLT 433 (H) 09/02/2020 0348   MCV 95.6 09/02/2020 0348   MCH 29.9 09/02/2020 0348   MCHC 31.3 09/02/2020 0348   RDW 14.7 09/02/2020 0348   LYMPHSABS 2.0 08/28/2020 0129   MONOABS 0.9 08/28/2020 0129   EOSABS 0.1 08/28/2020 0129   BASOSABS 0.0 08/28/2020 0129    BMET    Component Value Date/Time   NA 139 09/02/2020 0348   NA 140 01/04/2019 1451   K 3.6 09/02/2020 0348   CL 104 09/02/2020 0348   CO2 29 09/02/2020 0348   GLUCOSE 79 09/02/2020 0348   BUN 8 09/02/2020 0348   BUN 12 01/04/2019 1451   CREATININE 0.69 09/02/2020 0348   CALCIUM 8.5 (L) 09/02/2020 0348   GFRNONAA >60 09/02/2020 0348   GFRAA 91 01/04/2019 1451    INR No results found for: INR   Intake/Output Summary (Last 24 hours) at 09/02/2020 0729 Last data filed at 09/02/2020 6962 Gross per 24 hour  Intake 880 ml  Output 1326 ml  Net -446 ml     Assessment:  75 y.o. male is s/p:  open ray amputation R 5th toe   5 Days Post-Op  Plan: -wound vac in place for change tomorrow -toes warm and motor in tact -ambulate with darco shoe, which is in room    Leontine Locket, PA-C [facility] [phone] 09/02/2020 7:29 AM

## 2020-09-02 NOTE — Progress Notes (Signed)
PROGRESS NOTE  Joel Perez PQD:[id number] DOB: Mar 01, 1946 DOA: 08/25/2020 PCP: [name], MD  HPI/Recap of past 18 hours: 75 year old M with PMH of diastolic CHF, LBBB, PAD, neuropathy, chronic stasis dermatitis, COPD, chronic hypoxic RF on 4 L and obesity presented to the ED with worsening right foot wound.  Admitted for sepsis due to right fifth MTP joint osteomyelitis and infected right foot ulcer.  He was started on IV vancomycin and Zosyn.  Blood cultures are NGTD.  Deep wound culture grew staph aureus and group B strep.  He underwent right fifth toe ray amputation by vascular surgery Dr. Stanford Breed on 08/28/2020.  Wound VAC placed on 08/31/2020.  Plan to discharge with wound VAC and home health services PT OT RN.  Seen by infectious disease and recommended total 4 weeks of IV Ancef via PICC line, end date 09/25/2020.  Gram positive cocci bacteremia from blood cx taken on 08/25/20.   09/02/20: Patient was seen and examined at his bedside.  His wife was present.  His right foot pain is well controlled.  He had no other complaints.    Assessment/Plan: Principal Problem:   Osteomyelitis ([place]) Active Problems:   COPD III with marked reversibility   Chronic respiratory failure with hypoxia (HCC)   PVD (peripheral vascular disease) (HCC)   Sepsis (HCC)   MSSA/GBS osteomyelitis status post fifth ray amputation by vascular surgery Presented with severe sepsis from right foot fifth MTP joint osteomyelitis and infected right foot ulcer: POA.   Met criteria with leukocytosis, tachycardia and lactic acidosis.   Blood culture taken on 08/25/2020 + for gram-positive cocci, likely contaminant per ID.   Repeated blood culture on 08/29/2020 negative to date. History of peripheral artery disease.    De-escalate antibiotics, DC'd cefepime and IV Flagyl on 08/31/2020. IV vancomycin DC'd on 09/01/2020. -Post amputation, wound VAC application on 9/40/7680. -Local wound care and pain control per vascular  surgery. Seen by infectious disease.  Plan for 4 weeks of IV Ancef and follow-up in ID clinic. Continue pain control.  Likely a contaminant, gram positive cocci isolated from anaerobic bottle 08/25/20 Likely a contaminant per ID. Repeated blood cultures negative to date.  Chronic intermittent loose stools, improving Self reported intermittent loose stools since December 2021 Continue Florastor probiotics 220 mg twice daily Afebrile, less likely infective Imodium as needed  Peripheral artery disease Continue statin Restart home aspirin 81 mg daily if okay with vascular surgery.  Chronic diastolic CHF  Euvolemic. TTE on 07/22/2020 with LVEF of 60 to 65%, mild LVH and indeterminate DD. -Continue home Lasix 20 mg daily -Monitor fluid and renal status while on diuretics Net I&O -4.8 L Continue strict I's and O's and daily weight.  Chronic COPD/chronic hypoxic respiratory failure:  Stable on home 4 L.   Currently at baseline. Continue home prednisone 10 mg   Will need to follow-up with pulmonary posthospitalization. -Continue home inhalers, albuterol nebulizer as needed.  -Continue supplemental oxygen.  Essential hypertension:  BP is currently at goal. -Continue home Lasix and losartan Continue to monitor vital signs.  Neuropathy -Continue home gabapentin.  Class II obesity Body mass index is 35.01 kg/m. Pressure Injury 07/22/20 Buttocks Left Stage 2 -  Partial thickness loss of dermis presenting as a shallow open injury with a red, pink wound bed without slough. (Active)  07/22/20 1600  Location: Buttocks  Location Orientation: Left  Staging: Stage 2 -  Partial thickness loss of dermis presenting as a shallow open injury with a red, pink  wound bed without slough.  Wound Description (Comments):   Present on Admission: Yes     Pressure Injury 07/23/20 Heel Right Stage 3 -  Full thickness tissue loss. Subcutaneous fat may be visible but bone, tendon or muscle are NOT  exposed. (Active)  07/23/20 0800  Location: Heel  Location Orientation: Right  Staging: Stage 3 -  Full thickness tissue loss. Subcutaneous fat may be visible but bone, tendon or muscle are NOT exposed.  Wound Description (Comments):   Present on Admission: Yes   DVT prophylaxis:  Subcu heparin 3 times daily.  Code Status: Full code Family Communication: Updated his wife at bedside.  Consultants: Vascular surgery Infectious disease  Level of care: Telemetry Medical Status is: Inpatient  Remains inpatient appropriate because:Ongoing diagnostic testing needed not appropriate for outpatient work up, Unsafe d/c plan, IV treatments appropriate due to intensity of illness or inability to take PO and Inpatient level of care appropriate due to severity of illness   Dispo: The patient is from: Home  Anticipated d/c is to: Home with home health services once vascular surgery signs off and his portable wound VAC is delivered, possibly on 09/03/2020.  Patient currently is not medically stable to d/c.              Difficult to place patient not applicable.       Objective: Vitals:   09/02/20 0345 09/02/20 0732 09/02/20 0821 09/02/20 1042  BP: (!) 131/58 (!) 153/70  (!) 129/56  Pulse: 73 85  93  Resp: $Remo'16 18  18  'ZxRdL$ Temp: 98.3 F (36.8 C) 97.9 F (36.6 C)  98.2 F (36.8 C)  TempSrc: Oral Oral  Oral  SpO2: 97% 97% 96% 98%  Weight:      Height:        Intake/Output Summary (Last 24 hours) at 09/02/2020 1600 Last data filed at 09/02/2020 1500 Gross per 24 hour  Intake 500 ml  Output 851 ml  Net -351 ml   Filed Weights   08/28/20 1147 08/29/20 0633 08/30/20 0338  Weight: 111 kg 105.7 kg 104.6 kg    Exam:  . General: 75 y.o. year-old male pleasant well-developed well-nourished in no acute distress.  He is alert oriented x3.   . Cardiovascular: Regular rate and rhythm no rubs gallops.   Marland Kitchen Respiratory: Clear to auscultation no wheezes or  rales. . Abdomen: Obese nontender normal bowel sounds present. . Musculoskeletal: Trace lower extremity edema bilaterally.  Right foot in surgical dressing.  Wound VAC in place.   . Skin: Right foot in surgical dressing.  Wound VAC in place. Marland Kitchen Psychiatry: Mood is appropriate for condition and setting.   Data Reviewed: CBC: Recent Labs  Lab 08/28/20 0129 08/29/20 0208 08/30/20 0615 08/31/20 0034 09/02/20 0348  WBC 12.0* 10.1 11.6* 11.9* 10.1  NEUTROABS 8.7*  --   --   --   --   HGB 10.7* 10.3* 9.9* 9.9* 10.2*  HCT 34.6* 32.6* 32.2* 31.7* 32.6*  MCV 95.3 93.7 95.0 96.4 95.6  PLT 444* 471* 452* 442* 569*   Basic Metabolic Panel: Recent Labs  Lab 08/27/20 0059 08/27/20 2113 08/28/20 0129 08/29/20 0208 08/30/20 0615 09/02/20 0348  NA 138  --  136 138 140 139  K 4.1  --  3.8 4.2 3.8 3.6  CL 106  --  104 104 106 104  CO2 27  --  $R'26 28 30 29  'in$ GLUCOSE 143*  --  76 110* 85 79  BUN 10  --  $'9 12 11 8  'V$ CREATININE 0.97 0.91 0.79 0.83 0.87 0.69  CALCIUM 8.3*  --  8.3* 8.5* 8.5* 8.5*  MG 2.3  --   --  2.1  --   --   PHOS 3.9  --   --  3.2  --   --    GFR: Estimated Creatinine Clearance: 93.6 mL/min (by C-G formula based on SCr of 0.69 mg/dL). Liver Function Tests: Recent Labs  Lab 08/27/20 0059 08/29/20 0208  ALBUMIN 2.5* 2.6*   No results for input(s): LIPASE, AMYLASE in the last 168 hours. No results for input(s): AMMONIA in the last 168 hours. Coagulation Profile: No results for input(s): INR, PROTIME in the last 168 hours. Cardiac Enzymes: No results for input(s): CKTOTAL, CKMB, CKMBINDEX, TROPONINI in the last 168 hours. BNP (last 3 results) No results for input(s): PROBNP in the last 8760 hours. HbA1C: No results for input(s): HGBA1C in the last 72 hours. CBG: No results for input(s): GLUCAP in the last 168 hours. Lipid Profile: No results for input(s): CHOL, HDL, LDLCALC, TRIG, CHOLHDL, LDLDIRECT in the last 72 hours. Thyroid Function Tests: No results for  input(s): TSH, T4TOTAL, FREET4, T3FREE, THYROIDAB in the last 72 hours. Anemia Panel: No results for input(s): VITAMINB12, FOLATE, FERRITIN, TIBC, IRON, RETICCTPCT in the last 72 hours. Urine analysis:    Component Value Date/Time   COLORURINE YELLOW 08/25/2020 1620   APPEARANCEUR CLEAR 08/25/2020 1620   LABSPEC 1.008 08/25/2020 1620   PHURINE 8.0 08/25/2020 1620   GLUCOSEU NEGATIVE 08/25/2020 1620   HGBUR SMALL (A) 08/25/2020 1620   BILIRUBINUR NEGATIVE 08/25/2020 1620   KETONESUR NEGATIVE 08/25/2020 1620   PROTEINUR NEGATIVE 08/25/2020 1620   NITRITE NEGATIVE 08/25/2020 1620   LEUKOCYTESUR NEGATIVE 08/25/2020 1620   Sepsis Labs: $RemoveBefo'@[email or web address]'[id number]$ (procalcitonin:4,lacticidven:4)  ) Recent Results (from the past 240 hour(s))  Blood culture (routine x 2)     Status: None   Collection Time: 08/25/20  5:15 PM   Specimen: BLOOD  Result Value Ref Range Status   Specimen Description   Final    BLOOD LEFT ANTECUBITAL Performed at [facility], [place] [address] [place], [place] [address]    Special Requests   Final    BOTTLES DRAWN AEROBIC ONLY Blood Culture results may not be optimal due to an inadequate volume of blood received in culture bottles Performed at [place] [address], [place], [place] [address]    Culture   Final    NO GROWTH 5 DAYS Performed at [facility], [place] [address] [place], [place] [address]    Report Status 08/30/2020 FINAL  Final  Blood culture (routine x 2)     Status: None (Preliminary result)   Collection Time: 08/25/20  5:26 PM   Specimen: BLOOD  Result Value Ref Range Status   Specimen Description   Final    BLOOD RIGHT ANTECUBITAL Performed at [facility] [address] [place], [place] [address]    Special Requests   Final    BOTTLES DRAWN AEROBIC AND ANAEROBIC Blood Culture adequate volume Performed at [facility] [address] [place], [place]  [address]    Culture  Setup Time   Final    GRAM POSITIVE COCCI ANAEROBIC BOTTLE ONLY CRITICAL RESULT CALLED TO, READ BACK BY AND VERIFIED WITH: JAMWS L [id number] FCP    Culture   Final    GRAM POSITIVE COCCI CULTURE REINCUBATED FOR BETTER GROWTH Performed at [facility], [place] [address] [place], [place] [address]    Report Status PENDING  Incomplete  Aerobic Culture w Gram Stain (superficial specimen)     Status: None   Collection Time: 08/25/20  5:26 PM   Specimen: Foot  Result Value Ref Range Status   Specimen Description   Final    FOOT RIGHT Performed at [facility] [address], [place], [place] [address]    Special Requests   Final    NONE Performed at [facility], [place] [address], [place], [place] [address]    Gram Stain   Final    FEW WBC PRESENT, PREDOMINANTLY PMN ABUNDANT GRAM POSITIVE COCCI IN PAIRS IN CLUSTERS ABUNDANT GRAM NEGATIVE RODS FEW GRAM POSITIVE RODS Performed at [facility], [place] [address] [place], [place] [address]    Culture   Final    ABUNDANT GROUP B STREP(S.AGALACTIAE)ISOLATED TESTING AGAINST S. AGALACTIAE NOT ROUTINELY PERFORMED DUE TO PREDICTABILITY OF AMP/PEN/VAN SUSCEPTIBILITY. FEW STAPHYLOCOCCUS AUREUS    Report Status 08/29/2020 FINAL  Final   Organism ID, Bacteria STAPHYLOCOCCUS AUREUS  Final      Susceptibility   Staphylococcus aureus - MIC*    CIPROFLOXACIN <=0.5 SENSITIVE Sensitive     ERYTHROMYCIN >=8 RESISTANT Resistant     GENTAMICIN <=0.5 SENSITIVE Sensitive     OXACILLIN <=0.25 SENSITIVE Sensitive     TETRACYCLINE <=1 SENSITIVE Sensitive     VANCOMYCIN <=0.5 SENSITIVE Sensitive     TRIMETH/SULFA <=10 SENSITIVE Sensitive     CLINDAMYCIN >=8 RESISTANT Resistant     RIFAMPIN <=0.5 SENSITIVE Sensitive     Inducible Clindamycin NEGATIVE Sensitive     * FEW STAPHYLOCOCCUS AUREUS  Blood Culture ID Panel (Reflexed)     Status: None   Collection Time: 08/25/20  5:26 PM   Result Value Ref Range Status   Enterococcus faecalis NOT DETECTED NOT DETECTED Final   Enterococcus Faecium NOT DETECTED NOT DETECTED Final   Listeria monocytogenes NOT DETECTED NOT DETECTED Final   Staphylococcus species NOT DETECTED NOT DETECTED Final   Staphylococcus aureus (BCID) NOT DETECTED NOT DETECTED Final   Staphylococcus epidermidis NOT DETECTED NOT DETECTED Final   Staphylococcus lugdunensis NOT DETECTED NOT DETECTED Final   Streptococcus species NOT DETECTED NOT DETECTED Final   Streptococcus agalactiae NOT DETECTED NOT DETECTED Final   Streptococcus pneumoniae NOT DETECTED NOT DETECTED Final   Streptococcus pyogenes NOT DETECTED NOT DETECTED Final   A.calcoaceticus-baumannii NOT DETECTED NOT DETECTED Final   Bacteroides fragilis NOT DETECTED NOT DETECTED Final   Enterobacterales NOT DETECTED NOT DETECTED Final   Enterobacter cloacae complex NOT DETECTED NOT DETECTED Final   Escherichia coli NOT DETECTED NOT DETECTED Final   Klebsiella aerogenes NOT DETECTED NOT DETECTED Final   Klebsiella oxytoca NOT DETECTED NOT DETECTED Final   Klebsiella pneumoniae NOT DETECTED NOT DETECTED Final   Proteus species NOT DETECTED NOT DETECTED Final   Salmonella species NOT DETECTED NOT DETECTED Final   Serratia marcescens NOT DETECTED NOT DETECTED Final   Haemophilus influenzae NOT DETECTED NOT DETECTED Final   Neisseria meningitidis NOT DETECTED NOT DETECTED Final   Pseudomonas aeruginosa NOT DETECTED NOT DETECTED Final   Stenotrophomonas maltophilia NOT DETECTED NOT DETECTED Final   Candida albicans NOT DETECTED NOT DETECTED Final   Candida auris NOT DETECTED NOT DETECTED Final   Candida glabrata NOT DETECTED NOT DETECTED Final   Candida krusei NOT DETECTED NOT DETECTED Final   Candida parapsilosis NOT DETECTED NOT DETECTED Final   Candida  tropicalis NOT DETECTED NOT DETECTED Final   Cryptococcus neoformans/gattii NOT DETECTED NOT DETECTED Final    Comment: Performed at [facility], [place] [address], [place], [place] [address]  Resp Panel by RT-PCR (Flu A&B, Covid) Nasopharyngeal Swab     Status: None   Collection Time: 08/25/20  6:43 PM   Specimen: Nasopharyngeal Swab; Nasopharyngeal(NP) swabs in vial transport medium  Result Value Ref Range Status   SARS Coronavirus 2 by RT PCR NEGATIVE NEGATIVE Final    Comment: (NOTE) SARS-CoV-2 target nucleic acids are NOT DETECTED.  The SARS-CoV-2 RNA is generally detectable in upper respiratory specimens during the acute phase of infection. The lowest concentration of SARS-CoV-2 viral copies this assay can detect is 138 copies/mL. A negative result does not preclude SARS-Cov-2 infection and should not be used as the sole basis for treatment or other patient management decisions. A negative result may occur with  improper specimen collection/handling, submission of specimen other than nasopharyngeal swab, presence of viral mutation(s) within the areas targeted by this assay, and inadequate number of viral copies(<138 copies/mL). A negative result must be combined with clinical observations, patient history, and epidemiological information. The expected result is Negative.  Fact Sheet for Patients:  EntrepreneurPulse.com.au  Fact Sheet for Healthcare Providers:  IncredibleEmployment.be  This test is no t yet approved or cleared by the [place] FDA and  has been authorized for detection and/or diagnosis of SARS-CoV-2 by FDA under an Emergency Use Authorization (EUA). This EUA will remain  in effect (meaning this test can be used) for the duration of the COVID-19 declaration under Section 564(b)(1) of the Act, 21 U.S.C.section 360bbb-3(b)(1), unless the authorization is terminated  or revoked sooner.       Influenza A by PCR NEGATIVE NEGATIVE Final   Influenza B by PCR NEGATIVE NEGATIVE Final    Comment: (NOTE) The Xpert Xpress SARS-CoV-2/FLU/RSV plus assay is intended as  an aid in the diagnosis of influenza from Nasopharyngeal swab specimens and should not be used as a sole basis for treatment. Nasal washings and aspirates are unacceptable for Xpert Xpress SARS-CoV-2/FLU/RSV testing.  Fact Sheet for Patients: EntrepreneurPulse.com.au  Fact Sheet for Healthcare Providers: IncredibleEmployment.be  This test is not yet approved or cleared by the [place] FDA and has been authorized for detection and/or diagnosis of SARS-CoV-2 by FDA under an Emergency Use Authorization (EUA). This EUA will remain in effect (meaning this test can be used) for the duration of the COVID-19 declaration under Section 564(b)(1) of the Act, 21 U.S.C. section 360bbb-3(b)(1), unless the authorization is terminated or revoked.  Performed at [facility], [place] [address], [place], [place] [address]   Surgical PCR screen     Status: Abnormal   Collection Time: 08/28/20 10:47 AM   Specimen: Nasal Mucosa; Nasal Swab  Result Value Ref Range Status   MRSA, PCR NEGATIVE NEGATIVE Final   Staphylococcus aureus POSITIVE (A) NEGATIVE Final    Comment: (NOTE) The Xpert SA Assay (FDA approved for NASAL specimens in patients 6 years of age and older), is one component of a comprehensive surveillance program. It is not intended to diagnose infection nor to guide or monitor treatment. Performed at [facility], [place] [address] [place], [place] [address]   Aerobic/Anaerobic Culture w Gram Stain (surgical/deep wound)     Status: None   Collection Time: 08/28/20  1:10 PM   Specimen: Toe, Right; Amputation  Result Value Ref Range Status   Specimen Description TOE  Final   Special Requests RIGHT FIFTH TOE  Final   Gram Stain   Final    ABUNDANT WBC PRESENT,BOTH PMN AND MONONUCLEAR RARE GRAM POSITIVE COCCI    Culture   Final    RARE STAPHYLOCOCCUS AUREUS RARE GROUP B STREP(S.AGALACTIAE)ISOLATED TESTING AGAINST S.  AGALACTIAE NOT ROUTINELY PERFORMED DUE TO PREDICTABILITY OF AMP/PEN/VAN SUSCEPTIBILITY. NO ANAEROBES ISOLATED Performed at [facility], [place] [address] [place], [place] [address]    Report Status 09/02/2020 FINAL  Final   Organism ID, Bacteria STAPHYLOCOCCUS AUREUS  Final      Susceptibility   Staphylococcus aureus - MIC*    CIPROFLOXACIN <=0.5 SENSITIVE Sensitive     ERYTHROMYCIN >=8 RESISTANT Resistant     GENTAMICIN <=0.5 SENSITIVE Sensitive     OXACILLIN 0.5 SENSITIVE Sensitive     TETRACYCLINE <=1 SENSITIVE Sensitive     VANCOMYCIN 1 SENSITIVE Sensitive     TRIMETH/SULFA <=10 SENSITIVE Sensitive     CLINDAMYCIN >=8 RESISTANT Resistant     RIFAMPIN <=0.5 SENSITIVE Sensitive     Inducible Clindamycin NEGATIVE Sensitive     * RARE STAPHYLOCOCCUS AUREUS  Culture, blood (routine x 2)     Status: None (Preliminary result)   Collection Time: 08/29/20  6:23 AM   Specimen: BLOOD RIGHT HAND  Result Value Ref Range Status   Specimen Description BLOOD RIGHT HAND  Final   Special Requests   Final    BOTTLES DRAWN AEROBIC AND ANAEROBIC Blood Culture results may not be optimal due to an inadequate volume of blood received in culture bottles   Culture   Final    NO GROWTH 4 DAYS Performed at [facility], [place] [address], [place], [place] [address]    Report Status PENDING  Incomplete  Culture, blood (routine x 2)     Status: None (Preliminary result)   Collection Time: 08/29/20  6:27 AM   Specimen: BLOOD RIGHT HAND  Result Value Ref Range Status   Specimen Description BLOOD RIGHT HAND  Final   Special Requests   Final    BOTTLES DRAWN AEROBIC ONLY Blood Culture results may not be optimal due to an inadequate volume of blood received in culture bottles   Culture   Final    NO GROWTH 4 DAYS Performed at [facility], [place] [address] [place], [place] [address]    Report Status PENDING  Incomplete      Studies: No results found.  Scheduled Meds: . aspirin EC  81  mg Oral Daily  . atorvastatin  40 mg Oral Daily  . Chlorhexidine Gluconate Cloth  6 each Topical Daily  . fluticasone furoate-vilanterol  1 puff Inhalation Daily  . furosemide  20 mg Oral Daily  . gabapentin  600 mg Oral Q8H  . heparin  5,000 Units Subcutaneous Q8H  . losartan  50 mg Oral Daily  . predniSONE  10 mg Oral Q breakfast  . saccharomyces boulardii  250 mg Oral BID  . sodium chloride flush  10-40 mL Intracatheter Q12H  . sodium chloride flush  3 mL Intravenous Q12H  . umeclidinium bromide  1 puff Inhalation Daily    Continuous Infusions: . sodium chloride    .  ceFAZolin (ANCEF) IV Stopped (09/02/20 1353)  . lactated ringers 10 mL/hr at 08/28/20 1148     LOS: 8 days     Kayleen Memos, MD [facility] Pager [phone]  If 7PM-7AM, please contact night-coverage [email or web address] Password [facility] 09/02/2020, 4:00 PM

## 2020-09-02 NOTE — Progress Notes (Addendum)
Mobility Specialist: Progress Note   09/02/20 1431  Mobility  Activity Ambulated in hall  Level of Assistance Minimal assist, patient does 75% or more  Assistive Device Front wheel walker  Distance Ambulated (ft) 50 ft  Mobility Response Tolerated fair  Mobility performed by Mobility specialist  $Mobility charge 1 Mobility   During Mobility: 93% SpO2 Post-Mobility: 97 HR, 97% SpO2  Pt on 4 L/min [place] upon entering room but required 6 L/min [place] to ambulate. Pt to [facility] but agreeable to ambulate after. Pt was unable to have BM. Distance limited due to fatigue and SOB. Pt to bed after walk with call bell at his side. Pt is back on 4 L/min.   [facility] Thien Berka Mobility Specialist Mobility Specialist Phone: [phone]

## 2020-09-02 NOTE — TOC Progression Note (Signed)
Transition of Care (TOC) - Progression Note  Marvetta Gibbons RN, BSN Transitions of Care Unit 4E- RN Case Manager See Treatment Team for direct phone #    Patient Details  Name: MATTHEW CINA MRN: [id number] Date of Birth: [birth year]  Transition of Care [facility]) CM/SW Contact  Dahlia Client, Romeo Rabon, RN Phone Number: 09/02/2020, 11:57 AM  Clinical Narrative:    Noted per ID pt will need 4 wks of IV abx- end date 5/5. PICC line has been placed for home use.  Spoke with Amy at [facility] to update on home IV abx needs in addition to home wound VAC (RN/PT) needs referred last week- per Amy they will be able to service for the additional home IV abx needs as well.  Call made to [place] with [facility] for home IV abx needs- per Pam she will reach out to [facility] to coordinate home infusion needs and come do bedside education with pt and wife prior to discharge.   Follow up done with Olivia Mackie at [facility] on home wound VAC- home [facility] has been approved and was delivered yesterday to the room. Confirmed with visit to the room and box is at bedside with home vac and supplies for home.  Discussed with pt and wife - box would need to go home with them at discharge and bedside RN will change pt over to home VAC.  Also discussed with pt and wife HH plans and coordination with [facility] along with home infusion company for home needs to include home wound VAC needs as well as home IV abx needs and PICC line care- pt and wife agreeable and wife states she is willing to education on home IV abx needs.   HH and home IV abx needs have been arranged and pt will be ready for transition home pending vascular clearance, at present time, [facility] will plan to start home wound VAC changes on Friday 4/15 unless pt is still in hospital.   Expected Discharge Plan: [place] Barriers to Discharge: Continued Medical Work up  Expected Discharge Plan and Services Expected Discharge Plan: [place]   Discharge Planning Services: CM Consult Post Acute Care Choice: [place] arrangements for the past 2 months: Single Family Home                 DME Arranged: Vac DME Agency: [employer] Date DME Agency Contacted: 08/29/20 Time DME Agency Contacted: 67 Representative spoke with at DME Agency: Olivia Mackie HH Arranged: IV Antibiotics HH Agency: [facility] Date [place]: 09/02/20 Time [place]: 36 Representative spoke with at [place]: Pam/Amy   Social Determinants of Health ([place]) Interventions    Readmission Risk Interventions No flowsheet data found.

## 2020-09-03 LAB — CULTURE, BLOOD (ROUTINE X 2)
Culture: NO GROWTH
Culture: NO GROWTH

## 2020-09-03 MED ORDER — HEPARIN SOD (PORK) LOCK FLUSH 100 UNIT/ML IV SOLN
250.0000 [IU] | INTRAVENOUS | Status: AC | PRN
  Administered 2020-09-03: 250 [IU]
  Filled 2020-09-03: qty 2.5

## 2020-09-03 MED ORDER — LOPERAMIDE HCL 2 MG PO CAPS: 2.0000 mg | ORAL_CAPSULE | ORAL | 0 refills | Status: DC | PRN

## 2020-09-03 MED ORDER — SACCHAROMYCES BOULARDII 250 MG PO CAPS: 250.0000 mg | ORAL_CAPSULE | Freq: Two times a day (BID) | ORAL | 0 refills | Status: DC

## 2020-09-03 NOTE — Discharge Summary (Signed)
Physician Discharge Summary  Joel Perez GEZ:[id number] DOB: 10/12/1945 DOA: 08/25/2020  PCP: [name], MD  Admit date: 08/25/2020 Discharge date: 09/03/2020  Admitted From: Home Disposition: Home  Recommendations for Outpatient Follow-up:  : Home with home health PT and home health RN for right foot VAC changes 3 times weekly.  Home health with [facility] has been set up by transition of care team.  His home VAC has been delivered to his room.  We will make arrangements for follow-up with [facility] in approximately 2 weeks.   Home Health: Ordered Discharge Condition: Stable CODE STATUS: Full code Diet recommendation: Heart healthy Consultations:  Vascular surgery Procedures/Studies: . Right fifth ray amputation   Discharge Diagnoses:  Principal Problem:   Osteomyelitis ([place]) Active Problems: Severe sepsis   COPD III with marked reversibility   Chronic respiratory failure with hypoxia (HCC)   PVD (peripheral vascular disease) (HCC) Morbid obesity    Brief Summary: 75 year old M with PMH of diastolic CHF, LBBB, PAD, neuropathy, chronic stasis dermatitis, COPD, chronic hypoxic RF on 4 Land[place] presented to the ED with worsening right foot wound.  Admitted for sepsis due to rightfifthMTP joint osteomyelitis and infected right foot ulcer. He was started on IV vancomycin and Zosyn.  Deep wound culture grew staph aureus and group B strep.  He underwent right fifth toe ray amputation by vascular surgery Dr. Stanford Breed on 08/28/2020.  Wound VAC placed on 08/31/2020.    Gram positive cocci bacteremia from blood cx taken on 08/25/20.  Hospital Course:   MSSA osteomyelitis of right fifth metatarsal - in ED RR in 20s, HR in 100s, WBC count 15.8, Lactic acid 2.6  -Blood culture was noted to be positive for gram-positive cocci-ID feels this is a contaminant -4/7-underwent right fifth ray amputation and placement of wound VAC on was sent for culture -Wound culture reveals rare staph  aureus and rare group B strep -  Seen by infectious disease and recommended total 4 weeks of IV Ancef via PICC line, end date 09/25/2020 -Patient will go home with wound VAC and home health and follow-up with Dr. Luan Pulling in 2 weeks  Chronic loose stools -Apparently ongoing issue since December 2021 -Continue Florastor and Imodium as needed  Chronic diastolic heart failure -Last echo was in 08/11/2020 and showed an EF of 60 to 65%, mild LVH and indeterminate diastolic function -Continue Lasix  COPD with chronic hypoxic respiratory failure -He has remained stable on his baseline of 4 L of oxygen -He has been taking 10 mg of prednisone a day at home which is being continued -Followed by Dr. [name]  Essential hypertension -Continue losartan and Lasix - Follow BP as outpatient  Morbid obesity Body mass index is 33.09 kg/m.     Discharge Exam: Vitals:   09/03/20 0404 09/03/20 0739  BP: (!) 144/68 (!) 144/62  Pulse: 75 83  Resp: 16 17  Temp: 98.7 F (37.1 C) 98.2 F (36.8 C)  SpO2: 100% 99%   Vitals:   09/02/20 1955 09/02/20 2332 09/03/20 0404 09/03/20 0739  BP: 130/60 135/66 (!) 144/68 (!) 144/62  Pulse: 82 74 75 83  Resp: _0 Temp: 98.4 F (36.9 C) 98.4 F (36.9 C) 98.7 F (37.1 C) 98.2 F (36.8 C)  TempSrc: Oral Oral Oral Oral  SpO2: 100% 98% 100% 99%  Weight:      Height:        General: Pt is alert, awake, not in acute distress Cardiovascular: RRR, S1/S2 +, no rubs,  no gallops Respiratory: CTA bilaterally, no wheezing, no rhonchi Abdominal: Soft, NT, ND, bowel sounds + Extremities: no edema, no cyanosis   Discharge Instructions  Discharge Instructions    [facility] pharmacist to adjust dose for Vancomycin, Aminoglycosides and other anti-infective therapies as requested by physician.   Complete by: As directed    [facility] to provide Cath Flo 70m   Complete by: As directed    Administer for PICC line occlusion and as  ordered by physician for other access device issues.   Anaphylaxis Kit: Provided to treat any anaphylactic reaction to the medication being provided to the patient if First Dose or when requested by physician   Complete by: As directed    Epinephrine 1108mml vial / amp: Administer 0.51m92m0.51ml91mubcutaneously once for moderate to severe anaphylaxis, nurse to call physician and pharmacy when reaction occurs and call 911 if needed for immediate care   Diphenhydramine 50mg20mIV vial: Administer 25-50mg 87mM PRN for first dose reaction, rash, itching, mild reaction, nurse to call physician and pharmacy when reaction occurs   Sodium Chloride 0.9% NS 500ml I51mdminister if needed for hypovolemic blood pressure drop or as ordered by physician after call to physician with anaphylactic reaction   Change dressing on IV access line weekly and PRN   Complete by: As directed    Diet - low sodium heart healthy   Complete by: As directed    Flush IV access with Sodium Chloride 0.9% and Heparin 10 units/ml or 100 units/ml   Complete by: As directed    Home infusion instructions - [facility]   Complete by: As directed    Instructions: Flush IV access with Sodium Chloride 0.9% and Heparin 10units/ml or 100units/ml   Change dressing on IV access line: Weekly and PRN   Instructions Cath Flo 2mg: Ad80mister for PICC Line occlusion and as ordered by physician for other access device   [facility] pharmacist to adjust dose for: Vancomycin, Aminoglycosides and other anti-infective therapies as requested by physician   Increase activity slowly   Complete by: As directed    Method of administration may be changed at the discretion of home infusion pharmacist based upon assessment of the patient and/or caregiver's ability to self-administer the medication ordered   Complete by: As directed    No wound care   Complete by: As directed      Allergies as of 09/03/2020      Reactions   Other  Swelling, Other (See Comments)   Farmed Fish (tightness in throat & lip swelling)   Codeine Rash   Sulfa Antibiotics Hives      Medication List    TAKE these medications   albuterol (2.5 MG/3ML) 0.083% nebulizer solution Commonly known as: PROVENTIL USE 1 VIAL IN NEBULIZER EVERY 6 HOURS AS NEEDED FOR WHEEZING AND FOR SHORTNESS OF BREATH What changed: See the new instructions.   ProAir HFA 108 (90 Base) MCG/ACT inhaler Generic drug: albuterol INHALE 2 PUFFS BY MOUTH EVERY 6 HOURS AS NEEDED FOR WHEEZING FOR SHORTNESS OF BREATH What changed: See the new instructions.   aspirin EC 81 MG tablet Take 81 mg by mouth daily. Swallow whole.   atorvastatin 40 MG tablet Commonly known as: LIPITOR TAKE 1 TABLET BY MOUTH ONCE DAILY . APPOINTMENT REQUIRED FOR FUTURE REFILLS What changed: See the new instructions.   Breztri Aerosphere 160-9-4.8 MCG/ACT Aero Generic drug: Budeson-Glycopyrrol-Formoterol Take 2 puffs first thing in am and then another 2 puffs  about 12 hours later.   ceFAZolin  IVPB Commonly known as: ANCEF Inject 2 g into the vein every 8 (eight) hours for 24 days. Indication:  Osteomyelitis of right fifth toe  First Dose: Yes Last Day of Therapy:  09/25/2020 Labs - Once weekly:  CBC/D and BMP, Labs - Every other week:  ESR and CRP Method of administration: IV Push Method of administration may be changed at the discretion of home infusion pharmacist based upon assessment of the patient and/or caregiver's ability to self-administer the medication ordered.   cyclobenzaprine 10 MG tablet Commonly known as: FLEXERIL Take 10 mg by mouth at bedtime.   furosemide 20 MG tablet Commonly known as: LASIX Take 1 tablet by mouth once daily   gabapentin 600 MG tablet Commonly known as: NEURONTIN Take 600 mg by mouth every 8 (eight) hours.   loperamide 2 MG capsule Commonly known as: IMODIUM Take 1 capsule (2 mg total) by mouth as needed for diarrhea or loose stools.   losartan  50 MG tablet Commonly known as: COZAAR Take 1 tablet (50 mg total) by mouth daily.   multivitamin with minerals Tabs tablet Take 1 tablet by mouth daily.   oxyCODONE-acetaminophen 10-325 MG tablet Commonly known as: PERCOCET Take 1 tablet by mouth 2 (two) times daily as needed (back pain.).   predniSONE 10 MG tablet Commonly known as: DELTASONE 2 until better then one daily What changed: when to take this   saccharomyces boulardii 250 MG capsule Commonly known as: FLORASTOR Take 1 capsule (250 mg total) by mouth 2 (two) times daily.   vitamin B-12 1000 MCG tablet Commonly known as: CYANOCOBALAMIN Take 1,000 mcg by mouth daily.            Discharge Care Instructions  (From admission, onward)         Start     Ordered   09/01/20 0000  Change dressing on IV access line weekly and PRN  (Home infusion instructions - [facility] )        09/01/20 1843          Follow-up Information    [facility] Follow up.   Specialty: Home Health Services Why: HHRN/PT arranged- they will contact you to resume services (wound VAC arranged w/ [facility]- HHRN to do drsg changes M/W/F) - home IV abx will be coordinated with [facility] for home abx needs.  Contact information: [place] [address] [phone]        [place], FNP Follow up.   Specialties: Family Medicine, Infectious Diseases Why: 09/16/20 at 10:30am. If you are not able to make this appointment please call to reschedule.  Contact information: [address] [place] [address] [phone]        [name], MD. Call in 1 day(s).   Specialty: Family Medicine Why: Please call for a post hospital follow-up appointment. Contact information: [place], [place] [address] [phone]        Wellington Hampshire, MD .   Specialty: Cardiology Contact information: [address] [place]  [address] [phone]        Cherre Robins, MD Follow up.   Specialties: Vascular Surgery, Interventional Cardiology Why: Office will call you with appoinment day and time Contact information: [address] [place] [address] [phone]              Allergies  Allergen Reactions  .  Other Swelling and Other (See Comments)    Farmed Fish (tightness in throat & lip swelling)  . Codeine Rash  . Sulfa Antibiotics Hives      PERIPHERAL VASCULAR CATHETERIZATION  Result Date: 08/27/2020 DATE OF SERVICE: 08/27/2020  PATIENT:  [name]  75 y.o. male  PRE-OPERATIVE DIAGNOSIS:  Atherosclerosis of native arteries of right lower extremity causing osteomyelitis  POST-OPERATIVE DIAGNOSIS:  Same  PROCEDURE:  1) US guided left common femoral access 2) Aortogram 3) Right lower extremity angiogram with third order cannulation (129m total contrast) 4) Conscious sedation (27 minutes)  SURGEON:  Surgeon(s) and Role:    * HCherre Robins MD - Primary  ASSISTANT: none  ANESTHESIA:   local and IV sedation  EBL: min  BLOOD ADMINISTERED:none  DRAINS: none  LOCAL MEDICATIONS USED:  LIDOCAINE  SPECIMEN:  none  COUNTS: confirmed correct.  TOURNIQUET:  None  PATIENT DISPOSITION:  PACU - hemodynamically stable.  Delay start of Pharmacological VTE agent (>24hrs) due to surgical blood loss or risk of bleeding: no  INDICATION FOR PROCEDURE: PADRAIN NESBITis a 75y.o. male with right fifth metatarsal osteomyelitis and non-invasive vascular lab evidence of peripheral arterial disease. After careful discussion of risks, benefits, and alternatives the patient was offered angiography with intervention. The patient understood and wished to proceed.  OPERATIVE FINDINGS: No flow limiting disease in aortoiliac arteries Ectasia of bilateral common iliac arteries  Right lower extremity: Mild atherosclerosis, but no flow limiting disease in R CFA / R PFA / R SFA Mild atherosclerosis, but no flow limiting  disease in R popliteal artery Mild atherosclerosis, but no flow limiting disease inTibial trifurcation Moderate disease throughout the tibial vessels, but all three vessels course to the foot. Brisk flow into the pedal arch and pedal vessels. One area of focal atherosclerotic change in Hunter's canal was interrogated with direct measurement of pressure below and above. No difference in pressure noted.  DESCRIPTION OF PROCEDURE: After identification of the patient in the pre-operative holding area, the patient was transferred to the operating room. The patient was positioned supine on the operating room table. Anesthesia was induced. The groins was prepped and draped in standard fashion. A surgical pause was performed confirming correct patient, procedure, and operative location.  The left groin was anesthetized with subcutaneous injection of 1% lidocaine. Using ultrasound guidance, the left common femoral artery was accessed with micropuncture technique. Fluoroscopy was used to confirm cannulation over the femoral head. Sheathogram was not performed. The 73F sheath was upsized to 31F.  An 035 glidewire advantage was advanced into the distal aorta. Over the wire an omni flush catheter was advanced to the level of L2. Aortogram was performed - see above for details.  The right common iliac artery was selected with the 035 glidewire advantage. The wire was advanced into the common femoral artery. Over the wire the omni flush catheter was advanced into the external iliac artery. Selective angiography was performed - see above for details.  A mynx device was used to close the arteriotomy. Hemostasis was excellent upon completion.  Conscious sedation was administered with the use of IV fentanyl and midazolam under continuous physician and nurse monitoring. Heart rate, blood pressure, and oxygen saturation were continuously monitored. Total sedation time was 27 minutes  Upon completion of the case instrument and  sharps counts were confirmed correct. The patient was transferred to the PACU in good condition. I was present for all portions of the procedure.  PLAN: no evidence of  hemodynamically significant arterial disease requiring revascularization. Needs best medical therapy for PAD: ASA / Statin indefinitely.  [name]. Stanford Breed, MD [facility] Phone Number: [phone] 08/27/2020 5:24 PM    DG Foot Complete Right  Result Date: 08/25/2020 CLINICAL DATA:  75 year old male with concern for infection. EXAM: RIGHT FOOT COMPLETE - 3+ VIEW COMPARISON:  None. FINDINGS: There is no acute fracture or dislocation. Faint lucency of the base of the proximal phalanx of the fifth digit and fifth metatarsal head may represent an infectious process/osteomyelitis. MRI or a white blood cell nuclear scan may provide better evaluation. There is diffuse soft tissue swelling primarily involving the dorsum of the foot. Skin ulcer noted along the lateral forefoot adjacent to the fifth MTP joint. No radiopaque foreign object. IMPRESSION: 1. Findings concerning for osteomyelitis at the fifth MTP joint. MRI or a white blood cell nuclear scan may provide better evaluation. 2. Diffuse soft tissue swelling. Electronically Signed   By: Anner Crete M.D.   On: 08/25/2020 17:43   VAS [place] ABI WITH/WO TBI  Result Date: 08/27/2020 LOWER EXTREMITY DOPPLER STUDY Indications: Ulceration, and gangrene. High Risk Factors: Hypertension, Diabetes.  Limitations: Today's exam was limited due to involuntary patient movement. Comparison Study: no prior Performing Technologist: Abram Sander RVS  Examination Guidelines: A complete evaluation includes at minimum, Doppler waveform signals and systolic blood pressure reading at the level of bilateral brachial, anterior tibial, and posterior tibial arteries, when vessel segments are accessible. Bilateral testing is considered an integral part of a complete examination.  Photoelectric Plethysmograph (PPG) waveforms and toe systolic pressure readings are included as required and additional duplex testing as needed. Limited examinations for reoccurring indications may be performed as noted.  ABI Findings: +--------+------------------+-----+----------+--------+ Right   Rt Pressure (mmHg)IndexWaveform  Comment  +--------+------------------+-----+----------+--------+ [id number]                    triphasic          +--------+------------------+-----+----------+--------+ PTA     88                0.73 monophasic         +--------+------------------+-----+----------+--------+ DP      52                0.43 monophasic         +--------+------------------+-----+----------+--------+ +--------+------------------+-----+----------+-------+ Left    Lt Pressure (mmHg)IndexWaveform  Comment +--------+------------------+-----+----------+-------+ [id number]                    triphasic         +--------+------------------+-----+----------+-------+ PTA     74                0.62 monophasic        +--------+------------------+-----+----------+-------+ DP      88                0.73 monophasic        +--------+------------------+-----+----------+-------+ +-------+-----------+-----------+------------+------------+ ABI/TBIToday's ABIToday's TBIPrevious ABIPrevious TBI +-------+-----------+-----------+------------+------------+ Right  0.73                                           +-------+-----------+-----------+------------+------------+ Left   0.73                                           +-------+-----------+-----------+------------+------------+  Summary: Right: Resting right ankle-brachial index indicates moderate right lower extremity arterial disease. Left: Resting left ankle-brachial index indicates moderate left lower extremity arterial disease.  *See table(s) above for measurements and observations.  Electronically signed by  Curt Jews MD on 08/27/2020 at 6:02:05 AM.   Final    [place] EKG SITE RITE  Result Date: 09/01/2020 If Site Rite image not attached, placement could not be confirmed due to current cardiac rhythm.    The results of significant diagnostics from this hospitalization (including imaging, microbiology, ancillary and laboratory) are listed below for reference.     Microbiology: Recent Results (from the past 240 hour(s))  Blood culture (routine x 2)     Status: None   Collection Time: 08/25/20  5:15 PM   Specimen: BLOOD  Result Value Ref Range Status   Specimen Description   Final    BLOOD LEFT ANTECUBITAL Performed at [facility] [address] [place], [place] [address]    Special Requests   Final    BOTTLES DRAWN AEROBIC ONLY Blood Culture results may not be optimal due to an inadequate volume of blood received in culture bottles Performed at [facility] [address], [place], [place] [address]    Culture   Final    NO GROWTH 5 DAYS Performed at [facility], [place] [address] [place], [place] [address]    Report Status 08/30/2020 FINAL  Final  Blood culture (routine x 2)     Status: None (Preliminary result)   Collection Time: 08/25/20  5:26 PM   Specimen: BLOOD  Result Value Ref Range Status   Specimen Description   Final    BLOOD RIGHT ANTECUBITAL Performed at [facility] [address] [place], [place] [address]    Special Requests   Final    BOTTLES DRAWN AEROBIC AND ANAEROBIC Blood Culture adequate volume Performed at [facility] [address] [place], [place] [address]    Culture  Setup Time   Final    GRAM POSITIVE COCCI ANAEROBIC BOTTLE ONLY CRITICAL RESULT CALLED TO, READ BACK BY AND VERIFIED WITH: JAMWS L [id number] FCP    Culture   Final    GRAM POSITIVE COCCI IDENTIFICATION TO FOLLOW Performed at [facility], [place] [address], [place], [place] [address]    Report  Status PENDING  Incomplete  Aerobic Culture w Gram Stain (superficial specimen)     Status: None   Collection Time: 08/25/20  5:26 PM   Specimen: Foot  Result Value Ref Range Status   Specimen Description   Final    FOOT RIGHT Performed at [facility] [address] [place], [place] [address]    Special Requests   Final    NONE Performed at [facility], [place] [address], [place],  [address]    Gram Stain   Final    FEW WBC PRESENT, PREDOMINANTLY PMN ABUNDANT GRAM POSITIVE COCCI IN PAIRS IN CLUSTERS ABUNDANT GRAM NEGATIVE RODS FEW GRAM POSITIVE RODS Performed at [facility], [place] [address], [place],  [address]    Culture   Final    ABUNDANT GROUP B STREP(S.AGALACTIAE)ISOLATED TESTING AGAINST S. AGALACTIAE NOT ROUTINELY PERFORMED DUE TO PREDICTABILITY OF AMP/PEN/VAN SUSCEPTIBILITY. FEW STAPHYLOCOCCUS AUREUS    Report Status 08/29/2020 FINAL  Final   Organism ID, Bacteria STAPHYLOCOCCUS AUREUS  Final      Susceptibility   Staphylococcus aureus - MIC*  CIPROFLOXACIN <=0.5 SENSITIVE Sensitive     ERYTHROMYCIN >=8 RESISTANT Resistant     GENTAMICIN <=0.5 SENSITIVE Sensitive     OXACILLIN <=0.25 SENSITIVE Sensitive     TETRACYCLINE <=1 SENSITIVE Sensitive     VANCOMYCIN <=0.5 SENSITIVE Sensitive     TRIMETH/SULFA <=10 SENSITIVE Sensitive     CLINDAMYCIN >=8 RESISTANT Resistant     RIFAMPIN <=0.5 SENSITIVE Sensitive     Inducible Clindamycin NEGATIVE Sensitive     * FEW STAPHYLOCOCCUS AUREUS  Blood Culture ID Panel (Reflexed)     Status: None   Collection Time: 08/25/20  5:26 PM  Result Value Ref Range Status   Enterococcus faecalis NOT DETECTED NOT DETECTED Final   Enterococcus Faecium NOT DETECTED NOT DETECTED Final   Listeria monocytogenes NOT DETECTED NOT DETECTED Final   Staphylococcus species NOT DETECTED NOT DETECTED Final   Staphylococcus aureus (BCID) NOT DETECTED NOT DETECTED Final   Staphylococcus  epidermidis NOT DETECTED NOT DETECTED Final   Staphylococcus lugdunensis NOT DETECTED NOT DETECTED Final   Streptococcus species NOT DETECTED NOT DETECTED Final   Streptococcus agalactiae NOT DETECTED NOT DETECTED Final   Streptococcus pneumoniae NOT DETECTED NOT DETECTED Final   Streptococcus pyogenes NOT DETECTED NOT DETECTED Final   A.calcoaceticus-baumannii NOT DETECTED NOT DETECTED Final   Bacteroides fragilis NOT DETECTED NOT DETECTED Final   Enterobacterales NOT DETECTED NOT DETECTED Final   Enterobacter cloacae complex NOT DETECTED NOT DETECTED Final   Escherichia coli NOT DETECTED NOT DETECTED Final   Klebsiella aerogenes NOT DETECTED NOT DETECTED Final   Klebsiella oxytoca NOT DETECTED NOT DETECTED Final   Klebsiella pneumoniae NOT DETECTED NOT DETECTED Final   Proteus species NOT DETECTED NOT DETECTED Final   Salmonella species NOT DETECTED NOT DETECTED Final   Serratia marcescens NOT DETECTED NOT DETECTED Final   Haemophilus influenzae NOT DETECTED NOT DETECTED Final   Neisseria meningitidis NOT DETECTED NOT DETECTED Final   Pseudomonas aeruginosa NOT DETECTED NOT DETECTED Final   Stenotrophomonas maltophilia NOT DETECTED NOT DETECTED Final   Candida albicans NOT DETECTED NOT DETECTED Final   Candida auris NOT DETECTED NOT DETECTED Final   Candida glabrata NOT DETECTED NOT DETECTED Final   Candida krusei NOT DETECTED NOT DETECTED Final   Candida parapsilosis NOT DETECTED NOT DETECTED Final   Candida tropicalis NOT DETECTED NOT DETECTED Final   Cryptococcus neoformans/gattii NOT DETECTED NOT DETECTED Final    Comment: Performed at [facility], [address], [place], [place] [address]  Resp Panel by RT-PCR (Flu A&B, Covid) Nasopharyngeal Swab     Status: None   Collection Time: 08/25/20  6:43 PM   Specimen: Nasopharyngeal Swab; Nasopharyngeal(NP) swabs in vial transport medium  Result Value Ref Range Status   SARS Coronavirus 2 by RT PCR NEGATIVE NEGATIVE Final     Comment: (NOTE) SARS-CoV-2 target nucleic acids are NOT DETECTED.  The SARS-CoV-2 RNA is generally detectable in upper respiratory specimens during the acute phase of infection. The lowest concentration of SARS-CoV-2 viral copies this assay can detect is 138 copies/mL. A negative result does not preclude SARS-Cov-2 infection and should not be used as the sole basis for treatment or other patient management decisions. A negative result may occur with  improper specimen collection/handling, submission of specimen other than nasopharyngeal swab, presence of viral mutation(s) within the areas targeted by this assay, and inadequate number of viral copies(<138 copies/mL). A negative result must be combined with clinical observations, patient history, and epidemiological information. The expected result is Negative.  Fact Sheet for Patients:  EntrepreneurPulse.com.au  Fact Sheet for Healthcare Providers:  IncredibleEmployment.be  This test is no t yet approved or cleared by the [place] FDA and  has been authorized for detection and/or diagnosis of SARS-CoV-2 by FDA under an Emergency Use Authorization (EUA). This EUA will remain  in effect (meaning this test can be used) for the duration of the COVID-19 declaration under Section 564(b)(1) of the Act, 21 U.S.C.section 360bbb-3(b)(1), unless the authorization is terminated  or revoked sooner.       Influenza A by PCR NEGATIVE NEGATIVE Final   Influenza B by PCR NEGATIVE NEGATIVE Final    Comment: (NOTE) The Xpert Xpress SARS-CoV-2/FLU/RSV plus assay is intended as an aid in the diagnosis of influenza from Nasopharyngeal swab specimens and should not be used as a sole basis for treatment. Nasal washings and aspirates are unacceptable for Xpert Xpress SARS-CoV-2/FLU/RSV testing.  Fact Sheet for Patients: EntrepreneurPulse.com.au  Fact Sheet for Healthcare  Providers: IncredibleEmployment.be  This test is not yet approved or cleared by the [place] FDA and has been authorized for detection and/or diagnosis of SARS-CoV-2 by FDA under an Emergency Use Authorization (EUA). This EUA will remain in effect (meaning this test can be used) for the duration of the COVID-19 declaration under Section 564(b)(1) of the Act, 21 U.S.C. section 360bbb-3(b)(1), unless the authorization is terminated or revoked.  Performed at [facility], [place] [address], [place], [place] [address]   Surgical PCR screen     Status: Abnormal   Collection Time: 08/28/20 10:47 AM   Specimen: Nasal Mucosa; Nasal Swab  Result Value Ref Range Status   MRSA, PCR NEGATIVE NEGATIVE Final   Staphylococcus aureus POSITIVE (A) NEGATIVE Final    Comment: (NOTE) The Xpert SA Assay (FDA approved for NASAL specimens in patients 6 years of age and older), is one component of a comprehensive surveillance program. It is not intended to diagnose infection nor to guide or monitor treatment. Performed at [facility], [place] [address] [place], [place] [address]   Aerobic/Anaerobic Culture w Gram Stain (surgical/deep wound)     Status: None   Collection Time: 08/28/20  1:10 PM   Specimen: Toe, Right; Amputation  Result Value Ref Range Status   Specimen Description TOE  Final   Special Requests RIGHT FIFTH TOE  Final   Gram Stain   Final    ABUNDANT WBC PRESENT,BOTH PMN AND MONONUCLEAR RARE GRAM POSITIVE COCCI    Culture   Final    RARE STAPHYLOCOCCUS AUREUS RARE GROUP B STREP(S.AGALACTIAE)ISOLATED TESTING AGAINST S. AGALACTIAE NOT ROUTINELY PERFORMED DUE TO PREDICTABILITY OF AMP/PEN/VAN SUSCEPTIBILITY. NO ANAEROBES ISOLATED Performed at [facility], [place] [address], [place], [place] [address]    Report Status 09/02/2020 FINAL  Final   Organism ID, Bacteria STAPHYLOCOCCUS AUREUS  Final      Susceptibility   Staphylococcus  aureus - MIC*    CIPROFLOXACIN <=0.5 SENSITIVE Sensitive     ERYTHROMYCIN >=8 RESISTANT Resistant     GENTAMICIN <=0.5 SENSITIVE Sensitive     OXACILLIN 0.5 SENSITIVE Sensitive     TETRACYCLINE <=1 SENSITIVE Sensitive     VANCOMYCIN 1 SENSITIVE Sensitive     TRIMETH/SULFA <=10 SENSITIVE Sensitive     CLINDAMYCIN >=8 RESISTANT Resistant     RIFAMPIN <=0.5 SENSITIVE Sensitive     Inducible Clindamycin NEGATIVE Sensitive     * RARE STAPHYLOCOCCUS AUREUS  Culture, blood (routine x 2)     Status: None  Collection Time: 08/29/20  6:23 AM   Specimen: BLOOD RIGHT HAND  Result Value Ref Range Status   Specimen Description BLOOD RIGHT HAND  Final   Special Requests   Final    BOTTLES DRAWN AEROBIC AND ANAEROBIC Blood Culture results may not be optimal due to an inadequate volume of blood received in culture bottles   Culture   Final    NO GROWTH 5 DAYS Performed at [facility], [place] [address], [place], [place] [address]    Report Status 09/03/2020 FINAL  Final  Culture, blood (routine x 2)     Status: None   Collection Time: 08/29/20  6:27 AM   Specimen: BLOOD RIGHT HAND  Result Value Ref Range Status   Specimen Description BLOOD RIGHT HAND  Final   Special Requests   Final    BOTTLES DRAWN AEROBIC ONLY Blood Culture results may not be optimal due to an inadequate volume of blood received in culture bottles   Culture   Final    NO GROWTH 5 DAYS Performed at [facility], [place] [address], [place], [place] [address]    Report Status 09/03/2020 FINAL  Final     Labs: BNP (last 3 results) Recent Labs    07/24/20 0411 07/25/20 0501 07/26/20 0622  BNP 105.0* 123.0* 83.3   Basic Metabolic Panel: Recent Labs  Lab 08/27/20 2113 08/28/20 0129 08/29/20 0208 08/30/20 0615 09/02/20 0348  NA  --  136 138 140 139  K  --  3.8 4.2 3.8 3.6  CL  --  104 104 106 104  CO2  --  _0 GLUCOSE  --  76 110* 85 79  BUN  --  _1 CREATININE 0.91 0.79 0.83 0.87 0.69   CALCIUM  --  8.3* 8.5* 8.5* 8.5*  MG  --   --  2.1  --   --   PHOS  --   --  3.2  --   --    Liver Function Tests: Recent Labs  Lab 08/29/20 0208  ALBUMIN 2.6*   No results for input(s): LIPASE, AMYLASE in the last 168 hours. No results for input(s): AMMONIA in the last 168 hours. CBC: Recent Labs  Lab 08/28/20 0129 08/29/20 0208 08/30/20 0615 08/31/20 0034 09/02/20 0348  WBC 12.0* 10.1 11.6* 11.9* 10.1  NEUTROABS 8.7*  --   --   --   --   HGB 10.7* 10.3* 9.9* 9.9* 10.2*  HCT 34.6* 32.6* 32.2* 31.7* 32.6*  MCV 95.3 93.7 95.0 96.4 95.6  PLT 444* 471* 452* 442* 433*   Cardiac Enzymes: No results for input(s): CKTOTAL, CKMB, CKMBINDEX, TROPONINI in the last 168 hours. BNP: Invalid input(s): POCBNP CBG: No results for input(s): GLUCAP in the last 168 hours. D-Dimer No results for input(s): DDIMER in the last 72 hours. Hgb A1c No results for input(s): HGBA1C in the last 72 hours. Lipid Profile No results for input(s): CHOL, HDL, LDLCALC, TRIG, CHOLHDL, LDLDIRECT in the last 72 hours. Thyroid function studies No results for input(s): TSH, T4TOTAL, T3FREE, THYROIDAB in the last 72 hours.  Invalid input(s): FREET3 Anemia work up No results for input(s): VITAMINB12, FOLATE, FERRITIN, TIBC, IRON, RETICCTPCT in the last 72 hours. Urinalysis    Component Value Date/Time   COLORURINE YELLOW 08/25/2020 1620   APPEARANCEUR CLEAR 08/25/2020 1620   LABSPEC 1.008 08/25/2020 1620   PHURINE 8.0 08/25/2020 1620   GLUCOSEU NEGATIVE 08/25/2020 1620   HGBUR  SMALL (A) 08/25/2020 1620   BILIRUBINUR NEGATIVE 08/25/2020 1620   KETONESUR NEGATIVE 08/25/2020 1620   PROTEINUR NEGATIVE 08/25/2020 1620   NITRITE NEGATIVE 08/25/2020 1620   LEUKOCYTESUR NEGATIVE 08/25/2020 1620   Sepsis Labs Invalid input(s): PROCALCITONIN,  WBC,  LACTICIDVEN Microbiology Recent Results (from the past 240 hour(s))  Blood culture (routine x 2)     Status: None   Collection Time: 08/25/20  5:15 PM    Specimen: BLOOD  Result Value Ref Range Status   Specimen Description   Final    BLOOD LEFT ANTECUBITAL Performed at [facility], [place] [address], [place], [place] [address]    Special Requests   Final    BOTTLES DRAWN AEROBIC ONLY Blood Culture results may not be optimal due to an inadequate volume of blood received in culture bottles Performed at [place] [address] [place], [place] [address]    Culture   Final    NO GROWTH 5 DAYS Performed at [facility], [place] [address], [place], [place] [address]    Report Status 08/30/2020 FINAL  Final  Blood culture (routine x 2)     Status: None (Preliminary result)   Collection Time: 08/25/20  5:26 PM   Specimen: BLOOD  Result Value Ref Range Status   Specimen Description   Final    BLOOD RIGHT ANTECUBITAL Performed at [place] [address], [place], [place] [address]    Special Requests   Final    BOTTLES DRAWN AEROBIC AND ANAEROBIC Blood Culture adequate volume Performed at [place] [facility] [address] [place], [place] [address]    Culture  Setup Time   Final    GRAM POSITIVE COCCI ANAEROBIC BOTTLE ONLY CRITICAL RESULT CALLED TO, READ BACK BY AND VERIFIED WITH: JAMWS L [id number] FCP    Culture   Final    GRAM POSITIVE COCCI IDENTIFICATION TO FOLLOW Performed at [facility], [place] [address] [place], [place] [address]    Report Status PENDING  Incomplete  Aerobic Culture w Gram Stain (superficial specimen)     Status: None   Collection Time: 08/25/20  5:26 PM   Specimen: Foot  Result Value Ref Range Status   Specimen Description   Final    FOOT RIGHT Performed at [place] [address], [place], [place] [address]    Special Requests   Final    NONE Performed at [facility], [place] [address], [place], [place] [address]    Gram Stain   Final    FEW WBC PRESENT, PREDOMINANTLY  PMN ABUNDANT GRAM POSITIVE COCCI IN PAIRS IN CLUSTERS ABUNDANT GRAM NEGATIVE RODS FEW GRAM POSITIVE RODS Performed at [facility], [place] [address] [place], [place] [address]    Culture   Final    ABUNDANT GROUP B STREP(S.AGALACTIAE)ISOLATED TESTING AGAINST S. AGALACTIAE NOT ROUTINELY PERFORMED DUE TO PREDICTABILITY OF AMP/PEN/VAN SUSCEPTIBILITY. FEW STAPHYLOCOCCUS AUREUS    Report Status 08/29/2020 FINAL  Final   Organism ID, Bacteria STAPHYLOCOCCUS AUREUS  Final      Susceptibility   Staphylococcus aureus - MIC*    CIPROFLOXACIN <=0.5 SENSITIVE Sensitive     ERYTHROMYCIN >=8 RESISTANT Resistant     GENTAMICIN <=0.5 SENSITIVE Sensitive     OXACILLIN <=0.25 SENSITIVE Sensitive     TETRACYCLINE <=1 SENSITIVE Sensitive     VANCOMYCIN <=0.5 SENSITIVE Sensitive     TRIMETH/SULFA <=10 SENSITIVE Sensitive  CLINDAMYCIN >=8 RESISTANT Resistant     RIFAMPIN <=0.5 SENSITIVE Sensitive     Inducible Clindamycin NEGATIVE Sensitive     * FEW STAPHYLOCOCCUS AUREUS  Blood Culture ID Panel (Reflexed)     Status: None   Collection Time: 08/25/20  5:26 PM  Result Value Ref Range Status   Enterococcus faecalis NOT DETECTED NOT DETECTED Final   Enterococcus Faecium NOT DETECTED NOT DETECTED Final   Listeria monocytogenes NOT DETECTED NOT DETECTED Final   Staphylococcus species NOT DETECTED NOT DETECTED Final   Staphylococcus aureus (BCID) NOT DETECTED NOT DETECTED Final   Staphylococcus epidermidis NOT DETECTED NOT DETECTED Final   Staphylococcus lugdunensis NOT DETECTED NOT DETECTED Final   Streptococcus species NOT DETECTED NOT DETECTED Final   Streptococcus agalactiae NOT DETECTED NOT DETECTED Final   Streptococcus pneumoniae NOT DETECTED NOT DETECTED Final   Streptococcus pyogenes NOT DETECTED NOT DETECTED Final   A.calcoaceticus-baumannii NOT DETECTED NOT DETECTED Final   Bacteroides fragilis NOT DETECTED NOT DETECTED Final   Enterobacterales NOT DETECTED NOT DETECTED Final    Enterobacter cloacae complex NOT DETECTED NOT DETECTED Final   Escherichia coli NOT DETECTED NOT DETECTED Final   Klebsiella aerogenes NOT DETECTED NOT DETECTED Final   Klebsiella oxytoca NOT DETECTED NOT DETECTED Final   Klebsiella pneumoniae NOT DETECTED NOT DETECTED Final   Proteus species NOT DETECTED NOT DETECTED Final   Salmonella species NOT DETECTED NOT DETECTED Final   Serratia marcescens NOT DETECTED NOT DETECTED Final   Haemophilus influenzae NOT DETECTED NOT DETECTED Final   Neisseria meningitidis NOT DETECTED NOT DETECTED Final   Pseudomonas aeruginosa NOT DETECTED NOT DETECTED Final   Stenotrophomonas maltophilia NOT DETECTED NOT DETECTED Final   Candida albicans NOT DETECTED NOT DETECTED Final   Candida auris NOT DETECTED NOT DETECTED Final   Candida glabrata NOT DETECTED NOT DETECTED Final   Candida krusei NOT DETECTED NOT DETECTED Final   Candida parapsilosis NOT DETECTED NOT DETECTED Final   Candida tropicalis NOT DETECTED NOT DETECTED Final   Cryptococcus neoformans/gattii NOT DETECTED NOT DETECTED Final    Comment: Performed at [facility], [address], [place], [place] [address]  Resp Panel by RT-PCR (Flu A&B, Covid) Nasopharyngeal Swab     Status: None   Collection Time: 08/25/20  6:43 PM   Specimen: Nasopharyngeal Swab; Nasopharyngeal(NP) swabs in vial transport medium  Result Value Ref Range Status   SARS Coronavirus 2 by RT PCR NEGATIVE NEGATIVE Final    Comment: (NOTE) SARS-CoV-2 target nucleic acids are NOT DETECTED.  The SARS-CoV-2 RNA is generally detectable in upper respiratory specimens during the acute phase of infection. The lowest concentration of SARS-CoV-2 viral copies this assay can detect is 138 copies/mL. A negative result does not preclude SARS-Cov-2 infection and should not be used as the sole basis for treatment or other patient management decisions. A negative result may occur with  improper specimen collection/handling,  submission of specimen other than nasopharyngeal swab, presence of viral mutation(s) within the areas targeted by this assay, and inadequate number of viral copies(<138 copies/mL). A negative result must be combined with clinical observations, patient history, and epidemiological information. The expected result is Negative.  Fact Sheet for Patients:  EntrepreneurPulse.com.au  Fact Sheet for Healthcare Providers:  IncredibleEmployment.be  This test is no t yet approved or cleared by the [place] FDA and  has been authorized for detection and/or diagnosis of SARS-CoV-2 by FDA under an Emergency Use Authorization (EUA). This EUA will remain  in effect (meaning  this test can be used) for the duration of the COVID-19 declaration under Section 564(b)(1) of the Act, 21 U.S.C.section 360bbb-3(b)(1), unless the authorization is terminated  or revoked sooner.       Influenza A by PCR NEGATIVE NEGATIVE Final   Influenza B by PCR NEGATIVE NEGATIVE Final    Comment: (NOTE) The Xpert Xpress SARS-CoV-2/FLU/RSV plus assay is intended as an aid in the diagnosis of influenza from Nasopharyngeal swab specimens and should not be used as a sole basis for treatment. Nasal washings and aspirates are unacceptable for Xpert Xpress SARS-CoV-2/FLU/RSV testing.  Fact Sheet for Patients: EntrepreneurPulse.com.au  Fact Sheet for Healthcare Providers: IncredibleEmployment.be  This test is not yet approved or cleared by the [place] FDA and has been authorized for detection and/or diagnosis of SARS-CoV-2 by FDA under an Emergency Use Authorization (EUA). This EUA will remain in effect (meaning this test can be used) for the duration of the COVID-19 declaration under Section 564(b)(1) of the Act, 21 U.S.C. section 360bbb-3(b)(1), unless the authorization is terminated or revoked.  Performed at [facility], [place] [address] [place], [place] [address]   Surgical PCR screen     Status: Abnormal   Collection Time: 08/28/20 10:47 AM   Specimen: Nasal Mucosa; Nasal Swab  Result Value Ref Range Status   MRSA, PCR NEGATIVE NEGATIVE Final   Staphylococcus aureus POSITIVE (A) NEGATIVE Final    Comment: (NOTE) The Xpert SA Assay (FDA approved for NASAL specimens in patients 32 years of age and older), is one component of a comprehensive surveillance program. It is not intended to diagnose infection nor to guide or monitor treatment. Performed at [facility], [place] [address], [place], [place] [address]   Aerobic/Anaerobic Culture w Gram Stain (surgical/deep wound)     Status: None   Collection Time: 08/28/20  1:10 PM   Specimen: Toe, Right; Amputation  Result Value Ref Range Status   Specimen Description TOE  Final   Special Requests RIGHT FIFTH TOE  Final   Gram Stain   Final    ABUNDANT WBC PRESENT,BOTH PMN AND MONONUCLEAR RARE GRAM POSITIVE COCCI    Culture   Final    RARE STAPHYLOCOCCUS AUREUS RARE GROUP B STREP(S.AGALACTIAE)ISOLATED TESTING AGAINST S. AGALACTIAE NOT ROUTINELY PERFORMED DUE TO PREDICTABILITY OF AMP/PEN/VAN SUSCEPTIBILITY. NO ANAEROBES ISOLATED Performed at [facility], [place] [address] [place], [place] [address]    Report Status 09/02/2020 FINAL  Final   Organism ID, Bacteria STAPHYLOCOCCUS AUREUS  Final      Susceptibility   Staphylococcus aureus - MIC*    CIPROFLOXACIN <=0.5 SENSITIVE Sensitive     ERYTHROMYCIN >=8 RESISTANT Resistant     GENTAMICIN <=0.5 SENSITIVE Sensitive     OXACILLIN 0.5 SENSITIVE Sensitive     TETRACYCLINE <=1 SENSITIVE Sensitive     VANCOMYCIN 1 SENSITIVE Sensitive     TRIMETH/SULFA <=10 SENSITIVE Sensitive     CLINDAMYCIN >=8 RESISTANT Resistant     RIFAMPIN <=0.5 SENSITIVE Sensitive     Inducible Clindamycin NEGATIVE Sensitive     * RARE STAPHYLOCOCCUS AUREUS  Culture, blood (routine x 2)     Status: None    Collection Time: 08/29/20  6:23 AM   Specimen: BLOOD RIGHT HAND  Result Value Ref Range Status   Specimen Description BLOOD RIGHT HAND  Final   Special Requests   Final    BOTTLES DRAWN AEROBIC AND ANAEROBIC Blood Culture results may not be optimal due to an inadequate volume  of blood received in culture bottles   Culture   Final    NO GROWTH 5 DAYS Performed at [facility], [place] [address] [place], [place] [address]    Report Status 09/03/2020 FINAL  Final  Culture, blood (routine x 2)     Status: None   Collection Time: 08/29/20  6:27 AM   Specimen: BLOOD RIGHT HAND  Result Value Ref Range Status   Specimen Description BLOOD RIGHT HAND  Final   Special Requests   Final    BOTTLES DRAWN AEROBIC ONLY Blood Culture results may not be optimal due to an inadequate volume of blood received in culture bottles   Culture   Final    NO GROWTH 5 DAYS Performed at [facility], [place] [address] [place], [place] [address]    Report Status 09/03/2020 FINAL  Final     Time coordinating discharge in minutes: 65  SIGNED:   Debbe Odea, MD  [facility] 09/03/2020, 10:23 AM

## 2020-09-03 NOTE — Progress Notes (Signed)
Approximately around 815, pt'HR=170s on the screen. Pt asymptomatic. EKG obtained. MD notified and present at bedside. Pt's HR went down to 100s without any interventions.   Lavenia Atlas, RN

## 2020-09-03 NOTE — Progress Notes (Signed)
Occupational Therapy Treatment Patient Details Name: Joel Perez MRN: [id number] DOB: [birth year] Today's Date: 09/03/2020    History of present illness pt is a 75 y/o male admitted 4/4 with worsening R foot wound.  Found severe sepsis from R foo fifth MTP jt osteomyelitis and infected R foot ulcer. s/p open right fifth toe amputation.  PMHx:  neuropathy, HTN, COPD, blindness R eye.   OT comments  Pt making steady progress towards OT goals this session. Session focus on functional mobility, standing grooming tasks and toilet transfer. Pt currently requires min guard assist for functional mobility with Rw and total A for LB ADLs at pt reports his wife will help him don his Darco shoe at home. Pt completed standing ADLs at sink with supervision and toilet transfer to [facility] with min guard assist and Rw. Dc plan currently remains appropriate, will continue to follow acutely per POC.   pt on 4L [place] with SpO2 >93% during session, HR max 111 bpm with mobility  Follow Up Recommendations  No OT follow up    Equipment Recommendations  None recommended by OT    Recommendations for Other Services      Precautions / Restrictions Precautions Precautions: Fall Required Braces or Orthoses: Other Brace Other Brace: DARCO Shoe Restrictions Weight Bearing Restrictions: Yes RLE Weight Bearing: Weight bearing as tolerated       Mobility Bed Mobility Overal bed mobility: Needs Assistance Bed Mobility: Sit to Supine;Supine to Sit     Supine to sit: Min guard;HOB elevated Sit to supine: HOB elevated;Min guard   General bed mobility comments: min guard for safety and line mgmt, pt reports he plans to sleep in his recliner at home    Transfers Overall transfer level: Needs assistance Equipment used: Rolling walker (2 wheeled) Transfers: Sit to/from Stand Sit to Stand: Min guard;From elevated surface         General transfer comment: min guard to rise from EOB and BSC, cues for hand placement  from EOB but able to carryover cue when sit<>standing from [facility]    Balance Overall balance assessment: Needs assistance Sitting-balance support: No upper extremity supported Sitting balance-Leahy Scale: Good Sitting balance - Comments: sitting EOB for LB dressing with no LOB   Standing balance support: No upper extremity supported;During functional activity Standing balance-Leahy Scale: Fair Standing balance comment: standing at sink for grooming tasks with no LOB                           ADL either performed or assessed with clinical judgement   ADL Overall ADL's : Needs assistance/impaired     Grooming: Wash/dry face;Oral care;Standing;Supervision/safety Grooming Details (indicate cue type and reason): standing at sink with Rw             Lower Body Dressing: Total assistance;Sitting/lateral leans Lower Body Dressing Details (indicate cue type and reason): to don darco shoe from EOB, pt reports his wife will don it at home, wife present and demonstrated donning shoe from EOB,education provided on donning pants with wound vac with pt and wife verbalizing understanding Toilet Transfer: Min Marine scientist Details (indicate cue type and reason): simulated via functional mobility from EOB>sink>BSC with RW and min guard assist         Functional mobility during ADLs: Min guard;Rolling walker General ADL Comments: pt making good progress towards OT goals with pt able to complete household distance functional mobility with RW and min guard assist, standing  grooming tasks and toilet transfer to [place] Arousal/Alertness: Awake/alert Behavior During Therapy: WFL for tasks assessed/performed Overall Cognitive Status: Within Functional Limits for tasks assessed                                          Exercises     Shoulder Instructions       General Comments pt on 4L [place]  with SpO2 >93% during session, HR max 111 bpm with mobility. Wife present during session     Pertinent Vitals/ Pain       Pain Assessment: No/denies pain  Home Living                                          Prior Functioning/Environment              Frequency  Min 2X/week        Progress Toward Goals  OT Goals(current goals can now be found in the care plan section)  Progress towards OT goals: Progressing toward goals  Acute Rehab OT Goals Patient Stated Goal: to go home and pay his bills OT Goal Formulation: With patient Time For Goal Achievement: 09/12/20 Potential to Achieve Goals: Good  Plan Discharge plan remains appropriate;Frequency remains appropriate    Co-evaluation                 AM-PAC OT "6 Clicks" Daily Activity     Outcome Measure   Help from another person eating meals?: None Help from another person taking care of personal grooming?: None Help from another person toileting, which includes using toliet, bedpan, or urinal?: A Little Help from another person bathing (including washing, rinsing, drying)?: A Little Help from another person to put on and taking off regular upper body clothing?: None Help from another person to put on and taking off regular lower body clothing?: Total 6 Click Score: 19    End of Session Equipment Utilized During Treatment: Gait belt;Rolling walker;Oxygen;Other (comment) (darco shoe, 4L [place])  OT Visit Diagnosis: Unsteadiness on feet (R26.81)   Activity Tolerance Patient tolerated treatment well   Patient Left in bed;with call bell/phone within reach;with family/visitor present   Nurse Communication Mobility status        Time: 5625-6389 OT Time Calculation (min): 25 min  Charges: OT General Charges $OT Visit: 1 Visit OT Treatments $Self Care/Home Management : 23-37 mins  Harley [place]., COTA/L Acute Rehabilitation Services [phone]    Precious Haws 09/03/2020, 12:59 PM

## 2020-09-03 NOTE — Progress Notes (Signed)
Progress Note    09/03/2020 7:04 AM 6 Days Post-Op  Subjective:  Says pain in right foot is better and ambulated better last night.   Vitals:   09/02/20 2332 09/03/20 0404  BP: 135/66 (!) 144/68  Pulse: 74 75  Resp: 14 16  Temp: 98.4 F (36.9 C) 98.7 F (37.1 C)  SpO2: 98% 100%    Physical Exam:General appearance: Awake, alert in no apparent distress Cardiac: Heart rate and rhythm are regular Respirations: Nonlabored Extremities: Right foot: VAC dressing in place with good seal.  This was removed in its entirety.  Wound bed inspected and shows beefy red granulation tissue and minor oozing.  He has brisk dorsalis pedis, posterior tibial and peroneal artery Doppler signals.  Sterile dry dressing applied.  Right foot     CBC    Component Value Date/Time   WBC 10.1 09/02/2020 0348   RBC 3.41 (L) 09/02/2020 0348   HGB 10.2 (L) 09/02/2020 0348   HCT 32.6 (L) 09/02/2020 0348   PLT 433 (H) 09/02/2020 0348   MCV 95.6 09/02/2020 0348   MCH 29.9 09/02/2020 0348   MCHC 31.3 09/02/2020 0348   RDW 14.7 09/02/2020 0348   LYMPHSABS 2.0 08/28/2020 0129   MONOABS 0.9 08/28/2020 0129   EOSABS 0.1 08/28/2020 0129   BASOSABS 0.0 08/28/2020 0129    BMET    Component Value Date/Time   NA 139 09/02/2020 0348   NA 140 01/04/2019 1451   K 3.6 09/02/2020 0348   CL 104 09/02/2020 0348   CO2 29 09/02/2020 0348   GLUCOSE 79 09/02/2020 0348   BUN 8 09/02/2020 0348   BUN 12 01/04/2019 1451   CREATININE 0.69 09/02/2020 0348   CALCIUM 8.5 (L) 09/02/2020 0348   GFRNONAA >60 09/02/2020 0348   GFRAA 91 01/04/2019 1451     Intake/Output Summary (Last 24 hours) at 09/03/2020 0704 Last data filed at 09/03/2020 0603 Gross per 24 hour  Intake 300 ml  Output 501 ml  Net -201 ml    HOSPITAL MEDICATIONS Scheduled Meds: . aspirin EC  81 mg Oral Daily  . atorvastatin  40 mg Oral Daily  . Chlorhexidine Gluconate Cloth  6 each Topical Daily  . fluticasone furoate-vilanterol  1 puff  Inhalation Daily  . furosemide  20 mg Oral Daily  . gabapentin  600 mg Oral Q8H  . heparin  5,000 Units Subcutaneous Q8H  . losartan  50 mg Oral Daily  . predniSONE  10 mg Oral Q breakfast  . saccharomyces boulardii  250 mg Oral BID  . sodium chloride flush  10-40 mL Intracatheter Q12H  . sodium chloride flush  3 mL Intravenous Q12H  . umeclidinium bromide  1 puff Inhalation Daily   Continuous Infusions: . sodium chloride    .  ceFAZolin (ANCEF) IV 2 g (09/03/20 0603)  . lactated ringers 10 mL/hr at 08/28/20 1148   PRN Meds:.sodium chloride, acetaminophen **OR** acetaminophen, albuterol, hydrALAZINE, HYDROmorphone (DILAUDID) injection, labetalol, loperamide, ondansetron (ZOFRAN) IV, oxyCODONE-acetaminophen, sodium chloride flush, sodium chloride flush  Assessment and Plan: POD 4. 75 y.o. male is s/p open ray amputation R 5th toe.  Right lower extremity well-perfused.  Wound is showing good signs of healing.  Wound care RN to replace VAC dressing this morning.  Aortogram 4/6: no evidence of hemodynamically significant arterial disease requiring revascularization. Needs best medical therapy for PAD: ASA / Statin indefinitely.  Continue ambulation with Darco shoe.  Disposition: Home with home health PT and home health RN for right  foot VAC changes 3 times weekly.  Home health with [facility] has been set up by transition of care team.  His home VAC has been delivered to his room.  We will make arrangements for follow-up with [facility] in approximately 2 weeks. Vascular surgery will be available as needed.  -DVT prophylaxis:  Heparin [place]   Risa Grill, PA-C [facility] [phone] 09/03/2020  7:04 AM

## 2020-09-03 NOTE — Consult Note (Signed)
[place] Nurse Consult Note: Patient receiving care in [place]. Reason for Consult: application of NPWT to right foot amputation site Wound type: surgical Pressure Injury POA: Yes/No/NA Measurement: 6 cm x 5 cm x 0.6 cm Wound bed: beefy red Drainage (amount, consistency, odor) serosanginous Periwound: intact Dressing procedure/placement/frequency: one piece of black foam applied to wound bed. One barrier ring placed between wound margin and toe to facilitate seal. Drape applied. Immediate seal obtained. Patient tolerated without any c/o. Val Riles, RN, MSN, CWOCN, CNS-BC, pager [phone]

## 2020-09-03 NOTE — Progress Notes (Signed)
Mobility Specialist: Progress Note   09/03/20 1516  Mobility  Activity  (Cancel)   Pt prepping for discharge.   [facility] Ineta Sinning Mobility Specialist Mobility Specialist Phone: [phone]

## 2020-09-03 NOTE — Progress Notes (Signed)
Physical Therapy Treatment Patient Details Name: Joel Perez MRN: [id number] DOB: 03/27/1946 Today's Date: 09/03/2020    History of Present Illness pt is a 75 y/o male admitted 4/4 with worsening R foot wound.  Found severe sepsis from R foo fifth MTP jt osteomyelitis and infected R foot ulcer. s/p open right fifth toe amputation. PMHx: neuropathy, HTN, COPD, blindness R eye.    PT Comments    Pt received in supine, spouse present, pt agreeable to supine level exercises and instruction regarding safety/technique with transfers. Pt and spouse receptive to instruction on HEP (given supine/seated exercises) and with good tolerance to perform, able to perform straight leg raise on R with no quad lag. Pt reports no concerns regarding functional mobility tasks and deferred to perform due to arrival of lunch tray but this PTA did review safety/sequencing for bed mobility and transfers with pt/spouse and importance of compliance with darco shoe for any weight bearing tasks. Pt continues to benefit from PT services to progress toward functional mobility goals. Continue to recommend HHPT.   Follow Up Recommendations  Home health PT     Equipment Recommendations  None recommended by PT ((per pt he already owns RW, if this is incorrect then we recommend RW))    Recommendations for Other Services       Precautions / Restrictions Precautions Precautions: Fall Required Braces or Orthoses: Other Brace Other Brace: DARCO Shoe Restrictions Weight Bearing Restrictions: No RLE Weight Bearing: Weight bearing as tolerated    Mobility  Bed Mobility Overal bed mobility: Needs Assistance Bed Mobility: Sit to Supine;Supine to Sit     Supine to sit: Min guard;HOB elevated Sit to supine: HOB elevated;Min guard   General bed mobility comments: supervision for posterior supine scooting toward HOB, cues needed for sequencing/performing task efficiently; pt refusing EOB/OOB due to fatigue and wanting to  eat lunch prior to discharge    Transfers Overall transfer level: Needs assistance Equipment used: Rolling walker (2 wheeled) Transfers: Sit to/from Stand Sit to Stand: Min guard;From elevated surface         General transfer comment: pt deferring to attempt, verbal review for safety and visual demo for technique with pt/spouse for reinforcement post-op  Ambulation/Gait                 Stairs             Wheelchair Mobility    Modified Rankin (Stroke Patients Only)       Balance Overall balance assessment: Needs assistance Sitting-balance support: No upper extremity supported Sitting balance-Leahy Scale: Good Sitting balance - Comments: sitting EOB for LB dressing with no LOB   Standing balance support: No upper extremity supported;During functional activity Standing balance-Leahy Scale: Fair Standing balance comment: standing at sink for grooming tasks with no LOB                            Cognition Arousal/Alertness: Awake/alert Behavior During Therapy: WFL for tasks assessed/performed;Flat affect Overall Cognitive Status: Within Functional Limits for tasks assessed                                        Exercises General Exercises - Lower Extremity Ankle Circles/Pumps: AROM;Both;10 reps;Supine Quad Sets: AROM;Both;10 reps;Supine Short Arc Quad: AROM;Right;5 reps;Supine Heel Slides: AROM;Supine (3-5 reps ea) Hip ABduction/ADduction:  (verbal/visual review) Straight Leg Raises:  AROM;AAROM;Right;10 reps;Supine    General Comments General comments (skin integrity, edema, etc.): VSS resting on 4L O2 [place] SpO2 97-99% and with supine therex, HR 90 bpm; pt refusing OOB mobility, reviewed safety and activity pacing with pt/spouse      Pertinent Vitals/Pain Pain Assessment: 0-10 Pain Score: 2  Pain Location: R foot Pain Descriptors / Indicators: Discomfort Pain Intervention(s): Limited activity within patient's  tolerance;Monitored during session;Repositioned;Premedicated before session    Home Living                      Prior Function            PT Goals (current goals can now be found in the care plan section) Acute Rehab PT Goals Patient Stated Goal: to go home PT Goal Formulation: With patient Time For Goal Achievement: 09/12/20 Potential to Achieve Goals: Good Progress towards PT goals: Progressing toward goals    Frequency    Min 3X/week      PT Plan Current plan remains appropriate    Co-evaluation              AM-PAC PT "6 Clicks" Mobility   Outcome Measure  Help needed turning from your back to your side while in a flat bed without using bedrails?: A Little Help needed moving from lying on your back to sitting on the side of a flat bed without using bedrails?: A Little Help needed moving to and from a bed to a chair (including a wheelchair)?: A Little Help needed standing up from a chair using your arms (e.g., wheelchair or bedside chair)?: A Little Help needed to walk in hospital room?: A Little Help needed climbing 3-5 steps with a railing? : A Lot (per pt he will not need to perform steps) 6 Click Score: 17    End of Session Equipment Utilized During Treatment: Oxygen Activity Tolerance: Patient limited by fatigue Patient left: with family/visitor present;in bed;with bed alarm set;with call bell/phone within reach Nurse Communication: Mobility status PT Visit Diagnosis: Unsteadiness on feet (R26.81);Other abnormalities of gait and mobility (R26.89);Pain Pain - Right/Left: Right Pain - part of body: Ankle and joints of foot     Time: 0272-5366 PT Time Calculation (min) (ACUTE ONLY): 21 min  Charges:  $Therapeutic Exercise: 8-22 mins                     Zonie Crutcher P., PTA Acute Rehabilitation Services Pager: [phone] Office: [place] 09/03/2020, 2:53 PM

## 2020-09-03 NOTE — TOC Transition Note (Signed)
Transition of Care [facility]) - CM/SW Discharge Note   Patient Details  Name: Joel Perez MRN: [id number] Date of Birth: [birth year]  [facility] CM/SW Contact:  Zenon Mayo, RN Phone Number: 09/03/2020, 2:52 PM   Clinical Narrative:    Patient is for dc today, he has the wound vac, and iv abx is set up with Amy with Ted Mcalpine with [facility].  Patient will receive his 2 pm does today and then dc home, the wife will assist with the 10 pm dose and [facility] will pick up seeing the patient in the am.     Final next level of care: [place] Barriers to Discharge: No Barriers Identified   Patient Goals and CMS Choice Patient states their goals for this hospitalization and ongoing recovery are:: retrun home CMS Medicare.gov Compare Post Acute Care list provided to:: Patient Choice offered to / list presented to : Patient  Discharge Placement                       Discharge Plan and Services   Discharge Planning Services: CM Consult Post Acute Care Choice: Home Health,Durable Medical Equipment          DME Arranged: Vac DME Agency: [facility] Date DME Agency Contacted: 08/29/20 Time DME Agency Contacted: [phone] Representative spoke with at DME Agency: Olivia Mackie HH Arranged: IV Antibiotics [place]:  Roel Cluck) Date [place]: 09/02/20 Time [place]: 49 Representative spoke with at [place]: Pam/Amy  Social Determinants of Health ([place]) Interventions     Readmission Risk Interventions No flowsheet data found.

## 2020-09-03 NOTE — TOC Progression Note (Signed)
Transition of Care [facility]) - Progression Note    Patient Details  Name: ALEKAI POCOCK MRN: [id number] Date of Birth: 14-Jun-1945  Transition of Care [facility]) CM/SW Contact  Zenon Mayo, RN Phone Number: 09/03/2020, 11:39 AM  Clinical Narrative:    Per Pam the patient should receive his 1 or 2 pm dose of iv abx, here today and then he should be good to go home. Pam states she will speak with the Staff RN also regarding this also.   Expected Discharge Plan: [place] Barriers to Discharge: Continued Medical Work up  Expected Discharge Plan and Services Expected Discharge Plan: [place]   Discharge Planning Services: CM Consult Post Acute Care Choice: [place] arrangements for the past 2 months: Single Family Home Expected Discharge Date: 09/03/20               DME Arranged: Harlin Heys DME Agency: [facility] Date DME Agency Contacted: 08/29/20 Time DME Agency Contacted: [phone] Representative spoke with at DME Agency: Olivia Mackie HH Arranged: IV Antibiotics HH Agency: [facility] Date [place]: 09/02/20 Time [place]: 92 Representative spoke with at [place]: Pam/Amy   Social Determinants of Health ([place]) Interventions    Readmission Risk Interventions No flowsheet data found.

## 2020-09-03 NOTE — Progress Notes (Signed)
AVS went over with pt and his wife by [employer] nurse. Pt going home with PICC line, wound VAC and HH set up.   Lavenia Atlas, RN

## 2020-09-04 DIAGNOSIS — A419 Sepsis, unspecified organism: Secondary | ICD-10-CM | POA: Diagnosis not present

## 2020-09-04 DIAGNOSIS — M6281 Muscle weakness (generalized): Secondary | ICD-10-CM | POA: Diagnosis not present

## 2020-09-04 DIAGNOSIS — I8310 Varicose veins of unspecified lower extremity with inflammation: Secondary | ICD-10-CM | POA: Diagnosis not present

## 2020-09-04 DIAGNOSIS — J441 Chronic obstructive pulmonary disease with (acute) exacerbation: Secondary | ICD-10-CM | POA: Diagnosis not present

## 2020-09-04 DIAGNOSIS — I110 Hypertensive heart disease with heart failure: Secondary | ICD-10-CM | POA: Diagnosis not present

## 2020-09-04 DIAGNOSIS — I5032 Chronic diastolic (congestive) heart failure: Secondary | ICD-10-CM | POA: Diagnosis not present

## 2020-09-04 LAB — CULTURE, BLOOD (ROUTINE X 2): Special Requests: ADEQUATE

## 2020-09-05 DIAGNOSIS — A419 Sepsis, unspecified organism: Secondary | ICD-10-CM | POA: Diagnosis not present

## 2020-09-05 DIAGNOSIS — M6281 Muscle weakness (generalized): Secondary | ICD-10-CM | POA: Diagnosis not present

## 2020-09-05 DIAGNOSIS — I8310 Varicose veins of unspecified lower extremity with inflammation: Secondary | ICD-10-CM | POA: Diagnosis not present

## 2020-09-05 DIAGNOSIS — I110 Hypertensive heart disease with heart failure: Secondary | ICD-10-CM | POA: Diagnosis not present

## 2020-09-05 DIAGNOSIS — I5032 Chronic diastolic (congestive) heart failure: Secondary | ICD-10-CM | POA: Diagnosis not present

## 2020-09-05 DIAGNOSIS — J441 Chronic obstructive pulmonary disease with (acute) exacerbation: Secondary | ICD-10-CM | POA: Diagnosis not present

## 2020-09-08 DIAGNOSIS — J441 Chronic obstructive pulmonary disease with (acute) exacerbation: Secondary | ICD-10-CM | POA: Diagnosis not present

## 2020-09-08 DIAGNOSIS — I5032 Chronic diastolic (congestive) heart failure: Secondary | ICD-10-CM | POA: Diagnosis not present

## 2020-09-08 DIAGNOSIS — A419 Sepsis, unspecified organism: Secondary | ICD-10-CM | POA: Diagnosis not present

## 2020-09-08 DIAGNOSIS — M6281 Muscle weakness (generalized): Secondary | ICD-10-CM | POA: Diagnosis not present

## 2020-09-08 DIAGNOSIS — I8310 Varicose veins of unspecified lower extremity with inflammation: Secondary | ICD-10-CM | POA: Diagnosis not present

## 2020-09-08 DIAGNOSIS — I110 Hypertensive heart disease with heart failure: Secondary | ICD-10-CM | POA: Diagnosis not present

## 2020-09-09 ENCOUNTER — Ambulatory Visit: Payer: Medicare Other

## 2020-09-10 ENCOUNTER — Other Ambulatory Visit: Payer: Self-pay

## 2020-09-10 ENCOUNTER — Ambulatory Visit (INDEPENDENT_AMBULATORY_CARE_PROVIDER_SITE_OTHER): Payer: Medicare Other | Admitting: Physician Assistant

## 2020-09-10 VITALS — BP 139/61 | HR 110 | Temp 98.6°F | Resp 20 | Ht 70.0 in | Wt 232.0 lb

## 2020-09-10 DIAGNOSIS — S98131A Complete traumatic amputation of one right lesser toe, initial encounter: Secondary | ICD-10-CM

## 2020-09-10 NOTE — Progress Notes (Signed)
POST OPERATIVE OFFICE NOTE    CC:  F/u for surgery  HPI:  This is a 75 y.o. male who is s/p Open right fifth toe ray amputation by Dr. Stanford Breed on 08/28/20. This was performed secondary to osteomyelitis of the 5th toe. He had undergone Arteriogram 08/27/20 for evaluation of arterial disease of which no intervention was necessary. He was noted to have adequate flow throughout right lower extremity with some moderate tibial disease but no focal stenosis.   He presents today for wound follow up. Wound VAC was applied at time of surgery. [facility] home health has been coming to change wound VAC 3x/ week. He reports a new wound on the top of his right foot. He and his wife think this appeared on Friday of last week. Feel this is getting a little larger. He also has some wounds on lateral side of right foot which they had  Not noticed until today. Patient thinks they are from his wheel chair. Otherwise he has not had a lot of pain in legs. He does take chronic oxycodone for his back so he says this probably minimizes pain in his legs. He does report occasional sharp shooting pains down the legs. He has known venous hypertension and so has history of significant swelling of his legs. He does regularly elevate which helps some. He has a PICC line in left arm for IV Ancef. He will remain on this until 09/25/20   Allergies  Allergen Reactions  . Other Swelling and Other (See Comments)    Farmed Fish (tightness in throat & lip swelling)  . Codeine Rash  . Sulfa Antibiotics Hives    Current Outpatient Medications  Medication Sig Dispense Refill  . albuterol (PROVENTIL) (2.5 MG/3ML) 0.083% nebulizer solution USE 1 VIAL IN NEBULIZER EVERY 6 HOURS AS NEEDED FOR WHEEZING AND FOR SHORTNESS OF BREATH (Patient taking differently: Take 2.5 mg by nebulization every 6 (six) hours as needed for wheezing or shortness of breath.) 150 mL 2  . aspirin EC 81 MG tablet Take 81 mg by mouth daily. Swallow whole.    Marland Kitchen atorvastatin  (LIPITOR) 40 MG tablet TAKE 1 TABLET BY MOUTH ONCE DAILY . APPOINTMENT REQUIRED FOR FUTURE REFILLS (Patient taking differently: Take 40 mg by mouth daily.) 30 tablet 5  . Budeson-Glycopyrrol-Formoterol (BREZTRI AEROSPHERE) 160-9-4.8 MCG/ACT AERO Take 2 puffs first thing in am and then another 2 puffs about 12 hours later. 10.7 g 11  . ceFAZolin (ANCEF) IVPB Inject 2 g into the vein every 8 (eight) hours for 24 days. Indication:  Osteomyelitis of right fifth toe  First Dose: Yes Last Day of Therapy:  09/25/2020 Labs - Once weekly:  CBC/D and BMP, Labs - Every other week:  ESR and CRP Method of administration: IV Push Method of administration may be changed at the discretion of home infusion pharmacist based upon assessment of the patient and/or caregiver's ability to self-administer the medication ordered. 72 Units 0  . cyclobenzaprine (FLEXERIL) 10 MG tablet Take 10 mg by mouth at bedtime.    . furosemide (LASIX) 20 MG tablet Take 1 tablet by mouth once daily 30 tablet 0  . gabapentin (NEURONTIN) 600 MG tablet Take 600 mg by mouth every 8 (eight) hours.    Marland Kitchen loperamide (IMODIUM) 2 MG capsule Take 1 capsule (2 mg total) by mouth as needed for diarrhea or loose stools. 30 capsule 0  . losartan (COZAAR) 50 MG tablet Take 1 tablet (50 mg total) by mouth daily. 90 tablet 3  .  Multiple Vitamin (MULTIVITAMIN WITH MINERALS) TABS tablet Take 1 tablet by mouth daily.    Marland Kitchen oxyCODONE-acetaminophen (PERCOCET) 10-325 MG tablet Take 1 tablet by mouth 2 (two) times daily as needed (back pain.).     Marland Kitchen predniSONE (DELTASONE) 10 MG tablet 2 until better then one daily (Patient taking differently: See admin instructions. 2 until better then one daily) 100 tablet 2  . PROAIR HFA 108 (90 Base) MCG/ACT inhaler INHALE 2 PUFFS BY MOUTH EVERY 6 HOURS AS NEEDED FOR WHEEZING FOR SHORTNESS OF BREATH (Patient taking differently: Inhale 2 puffs into the lungs every 6 (six) hours as needed for wheezing or shortness of breath.) 18 g  3  . saccharomyces boulardii (FLORASTOR) 250 MG capsule Take 1 capsule (250 mg total) by mouth 2 (two) times daily. 60 capsule 0  . vitamin B-12 (CYANOCOBALAMIN) 1000 MCG tablet Take 1,000 mcg by mouth daily.    . sodium chloride (OCEAN NASAL SPRAY) 0.65 % nasal spray 2 sprays in each nostril as needed     No current facility-administered medications for this visit.     ROS:  See HPI  Physical Exam:  Vitals:   09/10/20 1052  BP: 139/61  Pulse: (!) 110  Resp: 20  Temp: 98.6 F (37 C)  TempSrc: Temporal  SpO2: 95%  Weight: 232 lb (105.2 kg)  Height: $Remove'5\' 10"'TKniSdP$  (1.778 m)   General: WDWN, not in any distress Cardiac: Regular Lungs: Non labored, on [place] Incision:   Right dorsum of foot with what appears to be ruptured bulli. Tissue appears healthy  Right lateral leg with superficial skin abrasions  Right 5th metatarsal amputation site is clean with good bleeding and beefy red tissue in wound bed. Wound VAC changed. Two nylon sutures on lateral aspect of wound Extremities: 2+ femoral pulses, he has Doppler PT/ DP and peroneal signals bilaterally. Feet warm and well perfused Neuro: alert and oriented Abdomen:  Obese, soft, non distended  Assessment/Plan:  This is a 75 y.o. male who is s/p Open right fifth toe ray amputation by Dr. Stanford Breed on 08/28/20. Arteriogram showed adequate perfusion of the RLE and he was not indicated for intervention. He toes have some tibial disease bilaterally. Both lower extremities are well perfused and warm with doppler signals. Right 5th toe amputation site is looking good. He has several new superficial wounds which hopefully will heal with time. I advised him to do his best to keep feet protected. Also encourage continued elevation of BLE. He will continue with wound VAC changes 3x/week. - He does have several nylon sutures on lateral most aspect of incision which will need removed at next visit - He will follow up in 2 weeks for wound check    Karoline Caldwell, PA-C [facility] [phone]  Clinic MD:  Laqueta Due

## 2020-09-11 DIAGNOSIS — J441 Chronic obstructive pulmonary disease with (acute) exacerbation: Secondary | ICD-10-CM | POA: Diagnosis not present

## 2020-09-11 DIAGNOSIS — I110 Hypertensive heart disease with heart failure: Secondary | ICD-10-CM | POA: Diagnosis not present

## 2020-09-11 DIAGNOSIS — I5032 Chronic diastolic (congestive) heart failure: Secondary | ICD-10-CM | POA: Diagnosis not present

## 2020-09-11 DIAGNOSIS — M6281 Muscle weakness (generalized): Secondary | ICD-10-CM | POA: Diagnosis not present

## 2020-09-11 DIAGNOSIS — I8310 Varicose veins of unspecified lower extremity with inflammation: Secondary | ICD-10-CM | POA: Diagnosis not present

## 2020-09-11 DIAGNOSIS — A419 Sepsis, unspecified organism: Secondary | ICD-10-CM | POA: Diagnosis not present

## 2020-09-12 DIAGNOSIS — J441 Chronic obstructive pulmonary disease with (acute) exacerbation: Secondary | ICD-10-CM | POA: Diagnosis not present

## 2020-09-12 DIAGNOSIS — I8310 Varicose veins of unspecified lower extremity with inflammation: Secondary | ICD-10-CM | POA: Diagnosis not present

## 2020-09-12 DIAGNOSIS — I110 Hypertensive heart disease with heart failure: Secondary | ICD-10-CM | POA: Diagnosis not present

## 2020-09-12 DIAGNOSIS — A419 Sepsis, unspecified organism: Secondary | ICD-10-CM | POA: Diagnosis not present

## 2020-09-12 DIAGNOSIS — M6281 Muscle weakness (generalized): Secondary | ICD-10-CM | POA: Diagnosis not present

## 2020-09-12 DIAGNOSIS — I5032 Chronic diastolic (congestive) heart failure: Secondary | ICD-10-CM | POA: Diagnosis not present

## 2020-09-14 ENCOUNTER — Other Ambulatory Visit: Payer: Self-pay | Admitting: Cardiovascular Disease

## 2020-09-14 DIAGNOSIS — R600 Localized edema: Secondary | ICD-10-CM

## 2020-09-15 ENCOUNTER — Other Ambulatory Visit (HOSPITAL_COMMUNITY)
Admission: RE | Admit: 2020-09-15 | Discharge: 2020-09-15 | Disposition: A | Discharge status: Home / Self Care | Payer: Medicare Other | Source: Skilled Nursing Facility | Attending: Family | Admitting: Family

## 2020-09-15 ENCOUNTER — Telehealth: Payer: Self-pay | Admitting: Cardiovascular Disease

## 2020-09-15 DIAGNOSIS — A419 Sepsis, unspecified organism: Secondary | ICD-10-CM | POA: Diagnosis not present

## 2020-09-15 DIAGNOSIS — I5032 Chronic diastolic (congestive) heart failure: Secondary | ICD-10-CM | POA: Diagnosis not present

## 2020-09-15 DIAGNOSIS — J189 Pneumonia, unspecified organism: Secondary | ICD-10-CM | POA: Insufficient documentation

## 2020-09-15 DIAGNOSIS — J441 Chronic obstructive pulmonary disease with (acute) exacerbation: Secondary | ICD-10-CM | POA: Diagnosis not present

## 2020-09-15 DIAGNOSIS — M6281 Muscle weakness (generalized): Secondary | ICD-10-CM | POA: Diagnosis not present

## 2020-09-15 DIAGNOSIS — I110 Hypertensive heart disease with heart failure: Secondary | ICD-10-CM | POA: Diagnosis not present

## 2020-09-15 DIAGNOSIS — I8310 Varicose veins of unspecified lower extremity with inflammation: Secondary | ICD-10-CM | POA: Diagnosis not present

## 2020-09-15 DIAGNOSIS — R600 Localized edema: Secondary | ICD-10-CM

## 2020-09-15 LAB — CBC WITH DIFFERENTIAL/PLATELET
Abs Immature Granulocytes: 0.07 10*3/uL (ref 0.00–0.07)
Basophils Absolute: 0 10*3/uL (ref 0.0–0.1)
Basophils Relative: 0 %
Eosinophils Absolute: 0 10*3/uL (ref 0.0–0.5)
Eosinophils Relative: 0 %
HCT: 31.2 % — ABNORMAL LOW (ref 39.0–52.0)
Hemoglobin: 9.7 g/dL — ABNORMAL LOW (ref 13.0–17.0)
Immature Granulocytes: 1 %
Lymphocytes Relative: 13 %
Lymphs Abs: 1.2 10*3/uL (ref 0.7–4.0)
MCH: 29.7 pg (ref 26.0–34.0)
MCHC: 31.1 g/dL (ref 30.0–36.0)
MCV: 95.4 fL (ref 80.0–100.0)
Monocytes Absolute: 0.5 10*3/uL (ref 0.1–1.0)
Monocytes Relative: 5 %
Neutro Abs: 7.5 10*3/uL (ref 1.7–7.7)
Neutrophils Relative %: 81 %
Platelets: 324 10*3/uL (ref 150–400)
RBC: 3.27 MIL/uL — ABNORMAL LOW (ref 4.22–5.81)
RDW: 15.6 % — ABNORMAL HIGH (ref 11.5–15.5)
WBC: 9.2 10*3/uL (ref 4.0–10.5)
nRBC: 0 % (ref 0.0–0.2)

## 2020-09-15 LAB — BASIC METABOLIC PANEL
Anion gap: 11 (ref 5–15)
BUN: 11 mg/dL (ref 8–23)
CO2: 26 mmol/L (ref 22–32)
Calcium: 8.8 mg/dL — ABNORMAL LOW (ref 8.9–10.3)
Chloride: 97 mmol/L — ABNORMAL LOW (ref 98–111)
Creatinine, Ser: 0.8 mg/dL (ref 0.61–1.24)
GFR, Estimated: >60 mL/min (ref >60)
Glucose, Bld: 111 mg/dL — ABNORMAL HIGH (ref 70–99)
Potassium: 4.3 mmol/L (ref 3.5–5.1)
Sodium: 134 mmol/L — ABNORMAL LOW (ref 135–145)

## 2020-09-15 MED ORDER — FUROSEMIDE 20 MG PO TABS: ORAL_TABLET | ORAL | 11 refills | Status: DC

## 2020-09-15 NOTE — Telephone Encounter (Signed)
Refill Request.  

## 2020-09-15 NOTE — Telephone Encounter (Signed)
*  STAT* If patient is at the pharmacy, call can be transferred to refill team.   1. Which medications need to be refilled? (please list name of each medication and dose if known)furosemide (LASIX) 20 MG tablet  2. Which pharmacy/location (including street and city if local pharmacy) is medication to be sent to?[place] ([place]), [place] - [address]  3. Do they need a 30 day or 90 day supply? 90 day   PT is all out of this medication

## 2020-09-16 ENCOUNTER — Encounter: Payer: Self-pay | Admitting: Family

## 2020-09-16 ENCOUNTER — Ambulatory Visit: Payer: Medicare Other | Admitting: Family

## 2020-09-16 ENCOUNTER — Other Ambulatory Visit: Payer: Self-pay

## 2020-09-16 VITALS — BP 117/69 | HR 101 | Temp 97.6°F | Wt 240.0 lb

## 2020-09-16 DIAGNOSIS — M869 Osteomyelitis, unspecified: Secondary | ICD-10-CM

## 2020-09-16 DIAGNOSIS — T3695XA Adverse effect of unspecified systemic antibiotic, initial encounter: Secondary | ICD-10-CM

## 2020-09-16 DIAGNOSIS — T8189XA Other complications of procedures, not elsewhere classified, initial encounter: Secondary | ICD-10-CM | POA: Diagnosis not present

## 2020-09-16 DIAGNOSIS — K521 Toxic gastroenteritis and colitis: Secondary | ICD-10-CM | POA: Diagnosis not present

## 2020-09-16 DIAGNOSIS — Z452 Encounter for adjustment and management of vascular access device: Secondary | ICD-10-CM | POA: Diagnosis not present

## 2020-09-16 NOTE — Assessment & Plan Note (Signed)
Joel Perez is having loose stools likely associated with Cefazolin. No concerns for C. Diff. Will likely improve once antibiotics are completed.

## 2020-09-16 NOTE — Progress Notes (Signed)
Subjective:    Patient ID: Joel Perez, male    DOB: [birth year], 75 y.o.   MRN: [id number]  Chief Complaint  Patient presents with  . Hospitalization Follow-up    Osteomyelitis      HPI:  Joel Perez is a 75 y.o. male with previous medical history significant for chronic diastolic heart failure, IVCD with LBBB, chronic venous insufficency with statis dermatitis, PAD, COPD on chronic O2 at 4L and neuropathy presenting today for hospitalization follow up.  Joel Perez was recently admitted with worsening right foot wound and fount to have osteomyelitis at the fifth MTP joint. Underwent open right 5th fifth ray amputation on 08/28/20. Surgical specimens grew gram positive cocci in pairs in clusters and gram negative rods on gram stain and Group B streptococcus and MSSA on culture. Blood cultures were negative. Placed on Cefazolin with end date planned for 09/25/20. In the interim seen by Vascular Surgery with wound vac change and noted to have good healing with new wound.   Joel Perez has been receiving his Cefazolin as prescribed and has noted having loose stools since starting with no other adverse side effects. Working on increasing protein intake. Denies fevers/chills. PICC line is functioning appropriately with no issues with medication administration. Home Health changing wound vac 3x weekly and PICC dressing weekly. Wound on top of his foot has improved since last seen by Vascular Surgery.    Allergies  Allergen Reactions  . Other Swelling and Other (See Comments)    Farmed Fish (tightness in throat & lip swelling)  . Codeine Rash  . Sulfa Antibiotics Hives      Outpatient Medications Prior to Visit  Medication Sig Dispense Refill  . albuterol (PROVENTIL) (2.5 MG/3ML) 0.083% nebulizer solution USE 1 VIAL IN NEBULIZER EVERY 6 HOURS AS NEEDED FOR WHEEZING AND FOR SHORTNESS OF BREATH (Patient taking differently: Take 2.5 mg by nebulization every 6 (six) hours as needed for wheezing or  shortness of breath.) 150 mL 2  . aspirin EC 81 MG tablet Take 81 mg by mouth daily. Swallow whole.    Marland Kitchen atorvastatin (LIPITOR) 40 MG tablet TAKE 1 TABLET BY MOUTH ONCE DAILY . APPOINTMENT REQUIRED FOR FUTURE REFILLS (Patient taking differently: Take 40 mg by mouth daily.) 30 tablet 5  . Budeson-Glycopyrrol-Formoterol (BREZTRI AEROSPHERE) 160-9-4.8 MCG/ACT AERO Take 2 puffs first thing in am and then another 2 puffs about 12 hours later. 10.7 g 11  . ceFAZolin (ANCEF) IVPB Inject 2 g into the vein every 8 (eight) hours for 24 days. Indication:  Osteomyelitis of right fifth toe  First Dose: Yes Last Day of Therapy:  09/25/2020 Labs - Once weekly:  CBC/D and BMP, Labs - Every other week:  ESR and CRP Method of administration: IV Push Method of administration may be changed at the discretion of home infusion pharmacist based upon assessment of the patient and/or caregiver's ability to self-administer the medication ordered. 72 Units 0  . cyclobenzaprine (FLEXERIL) 10 MG tablet Take 10 mg by mouth at bedtime.    . furosemide (LASIX) 20 MG tablet TAKE 1 TABLET BY MOUTH ONCE DAILY 30 tablet 11  . gabapentin (NEURONTIN) 600 MG tablet Take 600 mg by mouth every 8 (eight) hours.    Marland Kitchen loperamide (IMODIUM) 2 MG capsule Take 1 capsule (2 mg total) by mouth as needed for diarrhea or loose stools. 30 capsule 0  . losartan (COZAAR) 50 MG tablet Take 1 tablet (50 mg total) by mouth daily. 90 tablet 3  .  Multiple Vitamin (MULTIVITAMIN WITH MINERALS) TABS tablet Take 1 tablet by mouth daily.    Marland Kitchen oxyCODONE-acetaminophen (PERCOCET) 10-325 MG tablet Take 1 tablet by mouth 2 (two) times daily as needed (back pain.).     Marland Kitchen predniSONE (DELTASONE) 10 MG tablet 2 until better then one daily (Patient taking differently: See admin instructions. 2 until better then one daily) 100 tablet 2  . PROAIR HFA 108 (90 Base) MCG/ACT inhaler INHALE 2 PUFFS BY MOUTH EVERY 6 HOURS AS NEEDED FOR WHEEZING FOR SHORTNESS OF BREATH (Patient  taking differently: Inhale 2 puffs into the lungs every 6 (six) hours as needed for wheezing or shortness of breath.) 18 g 3  . saccharomyces boulardii (FLORASTOR) 250 MG capsule Take 1 capsule (250 mg total) by mouth 2 (two) times daily. 60 capsule 0  . sodium chloride (OCEAN) 0.65 % nasal spray 2 sprays in each nostril as needed    . vitamin B-12 (CYANOCOBALAMIN) 1000 MCG tablet Take 1,000 mcg by mouth daily.     No facility-administered medications prior to visit.     Past Medical History:  Diagnosis Date  . 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 BPH (benign prostatic hyperplasia)   . Capsular cataract of left eye   . COPD (chronic obstructive pulmonary disease) (HCC)    Dr. [name]  . Current smoker    Long-term smoker. Not interested in quitting  . Diverticulosis    With intermittent diverticulitis  . Erectile dysfunction   . Essential hypertension    Several recorded blood pressures greater than 140/90.  . H/O mitral valve prolapse    By report, but not confirmed by echo.  . Low back pain    Chronic. Followed by Dr. Suella Broad  . Mild aortic regurgitation 12/2013   Mild to moderate aortic regurgitation on echo  . Neuropathy      Past Surgical History:  Procedure Laterality Date  . ABDOMINAL AORTOGRAM W/LOWER EXTREMITY N/A 08/27/2020   Procedure: ABDOMINAL AORTOGRAM W/LOWER EXTREMITY;  Surgeon: Cherre Robins, MD;  Location: [facility];  Service: Cardiovascular;  Laterality: N/A;  . AMPUTATION Right 08/28/2020   Procedure: AMPUTATION RAY FIFTH TOE;  Surgeon: Cherre Robins, MD;  Location: [facility];  Service: Vascular;  Laterality: Right;  . APPLICATION OF WOUND VAC Right 08/28/2020   Procedure: APPLICATION OF WOUND VAC, right foot;  Surgeon: Cherre Robins, MD;  Location: [facility];  Service: Vascular;  Laterality: Right;  . COLON SURGERY  2000  . COLONOSCOPY WITH PROPOFOL N/A 03/18/2020   Procedure: COLONOSCOPY WITH PROPOFOL;  Surgeon: Otis Brace, MD;  Location: [facility];  Service: Gastroenterology;  Laterality: N/A;  . ELBOW SURGERY    . POLYPECTOMY  03/18/2020   Procedure: POLYPECTOMY;  Surgeon: Otis Brace, MD;  Location: [facility];  Service: Gastroenterology;;  . TRANSTHORACIC ECHOCARDIOGRAM  12/29/2016   EF 60-65%. Moderate LVH. Mild aortic valve calcification. Mild to moderate regurgitation with no stenosis. Mitral valve annular calcification with no comment of prolapse or regurgitation.       Review of Systems  Constitutional: Negative for chills, diaphoresis, fatigue and fever.  Respiratory: Negative for cough, chest tightness, shortness of breath and wheezing.   Cardiovascular: Negative for chest pain.  Gastrointestinal: Negative for abdominal pain, diarrhea, nausea and vomiting.      Objective:    BP 117/69   Pulse (!) 101   Temp 97.6 F (36.4 C) (Oral)   Wt 240 lb (108.9 kg)  BMI 34.44 kg/m  Nursing note and vital signs reviewed.  Physical Exam Constitutional:      General: He is not in acute distress.    Appearance: He is well-developed. He is obese.     Interventions: Nasal cannula in place.     Comments: Seated in the chair; pleasant.   Cardiovascular:     Rate and Rhythm: Normal rate and regular rhythm.     Heart sounds: Normal heart sounds.     Comments: PICC line in left upper extremity clean and dry.  Pulmonary:     Effort: Pulmonary effort is normal.     Breath sounds: Normal breath sounds.  Musculoskeletal:     Comments: Wound vac on right foot.   Skin:    General: Skin is warm and dry.  Neurological:     Mental Status: He is alert and oriented to person, place, and time.  Psychiatric:        Behavior: Behavior normal.        Thought Content: Thought content normal.        Judgment: Judgment normal.      Depression screen PHQ 2/9 11/04/2015  Decreased Interest 1  Down, Depressed, Hopeless 0  PHQ - 2 Score 1  Altered sleeping 0  Tired, decreased energy 2   Change in appetite 0  Feeling bad or failure about yourself  0  Trouble concentrating 0  Moving slowly or fidgety/restless 0  Suicidal thoughts 0  PHQ-9 Score 3  Difficult doing work/chores Somewhat difficult       Assessment & Plan:    Patient Active Problem List   Diagnosis Date Noted  . Antibiotic-associated diarrhea 09/16/2020  . PICC (peripherally inserted central catheter) in place 09/16/2020  . Osteomyelitis ([place]) 08/25/2020  . Multiple pulmonary nodules determined by computed tomography of lung 08/14/2020  . Community acquired pneumonia of right lower lobe of lung   . Pressure injury of skin 07/23/2020  . Sepsis ([place]) 07/22/2020  . Edema of both legs 12/19/2018  . PVD (peripheral vascular disease) ([place]) 12/19/2018  . DOE (dyspnea on exertion) 11/14/2018  . Chronic respiratory failure with hypoxia ([place]) 04/06/2018  . Decreased pedal pulses 01/17/2017  . Mild aortic valve regurgitation 01/17/2017  . Leg swelling 12/30/2016  . Neuropathy, alcoholic ([place]) 62/13/0865  . COPD III with marked reversibility 12/27/2012     Problem List Items Addressed This Visit      Digestive   Antibiotic-associated diarrhea - Primary    Joel Perez is having loose stools likely associated with Cefazolin. No concerns for C. Diff. Will likely improve once antibiotics are completed.         Musculoskeletal and Integument   Osteomyelitis [facility])    Joel Perez is a 75 y/o male with osteomyelitis of the right fifth ray s/p amputation with cultures positive for MSSA and Group B strep. Appears to be healing well. Inflammatory markers improving slowly. Continue current dose of Cefazolin through 5/5 and will consider PICC line removal and need for antibiotics going forward. Continue wound care per Vascular Surgery. Plan for follow up in 1 month or sooner if needed.         Other   PICC (peripherally inserted central catheter) in place    PICC line in left upper extremity appears clean and dry  with reported appropriate function. Continue PICC line care per protocol.           I am having Joel Perez maintain his oxyCODONE-acetaminophen, losartan, albuterol,  gabapentin, vitamin B-12, multivitamin with minerals, aspirin EC, ProAir HFA, atorvastatin, Breztri Aerosphere, predniSONE, cyclobenzaprine, ceFAZolin, loperamide, saccharomyces boulardii, sodium chloride, and furosemide.   Follow-up: Return in about 1 month (around 10/16/2020), or if symptoms worsen or fail to improve.   Terri Piedra, MSN, FNP-C Nurse Practitioner [facility] [place] number: [phone]

## 2020-09-16 NOTE — Patient Instructions (Signed)
Nice to see you.  We will continue your antibiotics through 5/5 and determine the need for any additional oral antibiotics.   Continue to take the Cefazolin as prescribed.  Continue wound care per Vascular Surgery.  Plan for follow up in 1 month or sooner if needed.   Have a great day and stay safe!

## 2020-09-16 NOTE — Assessment & Plan Note (Signed)
PICC line in left upper extremity appears clean and dry with reported appropriate function. Continue PICC line care per protocol.

## 2020-09-16 NOTE — Assessment & Plan Note (Signed)
Joel Perez is a 75 y/o male with osteomyelitis of the right fifth ray s/p amputation with cultures positive for MSSA and Group B strep. Appears to be healing well. Inflammatory markers improving slowly. Continue current dose of Cefazolin through 5/5 and will consider PICC line removal and need for antibiotics going forward. Continue wound care per Vascular Surgery. Plan for follow up in 1 month or sooner if needed.

## 2020-09-17 DIAGNOSIS — J441 Chronic obstructive pulmonary disease with (acute) exacerbation: Secondary | ICD-10-CM | POA: Diagnosis not present

## 2020-09-17 DIAGNOSIS — I110 Hypertensive heart disease with heart failure: Secondary | ICD-10-CM | POA: Diagnosis not present

## 2020-09-17 DIAGNOSIS — I5032 Chronic diastolic (congestive) heart failure: Secondary | ICD-10-CM | POA: Diagnosis not present

## 2020-09-17 DIAGNOSIS — M6281 Muscle weakness (generalized): Secondary | ICD-10-CM | POA: Diagnosis not present

## 2020-09-17 DIAGNOSIS — I8310 Varicose veins of unspecified lower extremity with inflammation: Secondary | ICD-10-CM | POA: Diagnosis not present

## 2020-09-17 DIAGNOSIS — A419 Sepsis, unspecified organism: Secondary | ICD-10-CM | POA: Diagnosis not present

## 2020-09-18 DIAGNOSIS — A419 Sepsis, unspecified organism: Secondary | ICD-10-CM | POA: Diagnosis not present

## 2020-09-18 DIAGNOSIS — J441 Chronic obstructive pulmonary disease with (acute) exacerbation: Secondary | ICD-10-CM | POA: Diagnosis not present

## 2020-09-18 DIAGNOSIS — I5032 Chronic diastolic (congestive) heart failure: Secondary | ICD-10-CM | POA: Diagnosis not present

## 2020-09-18 DIAGNOSIS — I8310 Varicose veins of unspecified lower extremity with inflammation: Secondary | ICD-10-CM | POA: Diagnosis not present

## 2020-09-18 DIAGNOSIS — M6281 Muscle weakness (generalized): Secondary | ICD-10-CM | POA: Diagnosis not present

## 2020-09-18 DIAGNOSIS — I110 Hypertensive heart disease with heart failure: Secondary | ICD-10-CM | POA: Diagnosis not present

## 2020-09-19 DIAGNOSIS — A419 Sepsis, unspecified organism: Secondary | ICD-10-CM | POA: Diagnosis not present

## 2020-09-19 DIAGNOSIS — I8310 Varicose veins of unspecified lower extremity with inflammation: Secondary | ICD-10-CM | POA: Diagnosis not present

## 2020-09-19 DIAGNOSIS — M6281 Muscle weakness (generalized): Secondary | ICD-10-CM | POA: Diagnosis not present

## 2020-09-19 DIAGNOSIS — I110 Hypertensive heart disease with heart failure: Secondary | ICD-10-CM | POA: Diagnosis not present

## 2020-09-19 DIAGNOSIS — J441 Chronic obstructive pulmonary disease with (acute) exacerbation: Secondary | ICD-10-CM | POA: Diagnosis not present

## 2020-09-19 DIAGNOSIS — I5032 Chronic diastolic (congestive) heart failure: Secondary | ICD-10-CM | POA: Diagnosis not present

## 2020-09-21 DIAGNOSIS — T8189XA Other complications of procedures, not elsewhere classified, initial encounter: Secondary | ICD-10-CM | POA: Diagnosis not present

## 2020-09-22 ENCOUNTER — Telehealth: Payer: Self-pay | Admitting: Cardiovascular Disease

## 2020-09-22 ENCOUNTER — Encounter (HOSPITAL_BASED_OUTPATIENT_CLINIC_OR_DEPARTMENT_OTHER): Payer: Medicare Other | Admitting: Internal Medicine

## 2020-09-22 ENCOUNTER — Encounter: Payer: Self-pay | Admitting: Family

## 2020-09-22 DIAGNOSIS — I110 Hypertensive heart disease with heart failure: Secondary | ICD-10-CM | POA: Diagnosis not present

## 2020-09-22 DIAGNOSIS — T8189XA Other complications of procedures, not elsewhere classified, initial encounter: Secondary | ICD-10-CM | POA: Diagnosis not present

## 2020-09-22 DIAGNOSIS — J441 Chronic obstructive pulmonary disease with (acute) exacerbation: Secondary | ICD-10-CM | POA: Diagnosis not present

## 2020-09-22 DIAGNOSIS — S91301D Unspecified open wound, right foot, subsequent encounter: Secondary | ICD-10-CM | POA: Diagnosis not present

## 2020-09-22 DIAGNOSIS — A419 Sepsis, unspecified organism: Secondary | ICD-10-CM | POA: Diagnosis not present

## 2020-09-22 DIAGNOSIS — Z4781 Encounter for orthopedic aftercare following surgical amputation: Secondary | ICD-10-CM | POA: Diagnosis not present

## 2020-09-22 DIAGNOSIS — I5032 Chronic diastolic (congestive) heart failure: Secondary | ICD-10-CM | POA: Diagnosis not present

## 2020-09-22 DIAGNOSIS — I8310 Varicose veins of unspecified lower extremity with inflammation: Secondary | ICD-10-CM | POA: Diagnosis not present

## 2020-09-22 DIAGNOSIS — M6281 Muscle weakness (generalized): Secondary | ICD-10-CM | POA: Diagnosis not present

## 2020-09-22 NOTE — Telephone Encounter (Signed)
Called patients home and the nurse was there, she has instructions to let us know when his weight is over 240 lb. His weight today was up almost 5 lbs but the patient admits to dietary indiscretion in the last couple days. He is having SOB but does not feel like it is more than his usual. The nurse reports his lungs are clear and he has no increase in his edema. Okay given for extra 20 mg of furosemide to be taken today or in the morning. They will let us know on Wednesday when the see him again of problems.

## 2020-09-22 NOTE — Telephone Encounter (Signed)
New message:    Joel Perez with [facility] calling to report weight 244.2 on the 09/18/20 240.6 04/24 and the 27 patient was 240. Please call the nurse at [phone].    Pt c/o Shortness Of Breath: STAT if SOB developed within the last 24 hours or pt is noticeably SOB on the phone  1. Are you currently SOB (can you hear that pt is SOB on the phone)? No  2. How long have you been experiencing SOB? About 2 weeks  3. Are you SOB when sitting or when up moving around? Moving around  4. Are you currently experiencing any other symptoms? No

## 2020-09-23 DIAGNOSIS — I5032 Chronic diastolic (congestive) heart failure: Secondary | ICD-10-CM | POA: Diagnosis not present

## 2020-09-23 DIAGNOSIS — J441 Chronic obstructive pulmonary disease with (acute) exacerbation: Secondary | ICD-10-CM | POA: Diagnosis not present

## 2020-09-23 DIAGNOSIS — A419 Sepsis, unspecified organism: Secondary | ICD-10-CM | POA: Diagnosis not present

## 2020-09-23 DIAGNOSIS — Z4781 Encounter for orthopedic aftercare following surgical amputation: Secondary | ICD-10-CM | POA: Diagnosis not present

## 2020-09-23 DIAGNOSIS — I8310 Varicose veins of unspecified lower extremity with inflammation: Secondary | ICD-10-CM | POA: Diagnosis not present

## 2020-09-23 DIAGNOSIS — I110 Hypertensive heart disease with heart failure: Secondary | ICD-10-CM | POA: Diagnosis not present

## 2020-09-23 DIAGNOSIS — M6281 Muscle weakness (generalized): Secondary | ICD-10-CM | POA: Diagnosis not present

## 2020-09-23 DIAGNOSIS — S91301D Unspecified open wound, right foot, subsequent encounter: Secondary | ICD-10-CM | POA: Diagnosis not present

## 2020-09-24 ENCOUNTER — Telehealth: Payer: Self-pay

## 2020-09-24 DIAGNOSIS — I5032 Chronic diastolic (congestive) heart failure: Secondary | ICD-10-CM | POA: Diagnosis not present

## 2020-09-24 DIAGNOSIS — I8310 Varicose veins of unspecified lower extremity with inflammation: Secondary | ICD-10-CM | POA: Diagnosis not present

## 2020-09-24 DIAGNOSIS — Z4781 Encounter for orthopedic aftercare following surgical amputation: Secondary | ICD-10-CM | POA: Diagnosis not present

## 2020-09-24 DIAGNOSIS — J441 Chronic obstructive pulmonary disease with (acute) exacerbation: Secondary | ICD-10-CM | POA: Diagnosis not present

## 2020-09-24 DIAGNOSIS — S91301D Unspecified open wound, right foot, subsequent encounter: Secondary | ICD-10-CM | POA: Diagnosis not present

## 2020-09-24 DIAGNOSIS — M6281 Muscle weakness (generalized): Secondary | ICD-10-CM | POA: Diagnosis not present

## 2020-09-24 DIAGNOSIS — A419 Sepsis, unspecified organism: Secondary | ICD-10-CM | POA: Diagnosis not present

## 2020-09-24 DIAGNOSIS — I110 Hypertensive heart disease with heart failure: Secondary | ICD-10-CM | POA: Diagnosis not present

## 2020-09-24 NOTE — Telephone Encounter (Signed)
I spoke with Tim with [facility] and gave him verbal orders to pull picc after patient's last dose on 09/25/20. Tim verbalized understanding. Joel Perez T Brooks Sailors

## 2020-09-24 NOTE — Telephone Encounter (Signed)
Yes, we can have them pull PICC after last dose of antibiotics. Inflammatory markers are normal. Thanks.

## 2020-09-24 NOTE — Telephone Encounter (Signed)
Tim with [facility] is calling stating patient is due to complete IV abx on 09/25/20. Per Tim they do not have pull picc orders. Do you want home health to pull picc? Please advise Joel Perez T Brooks Sailors

## 2020-09-25 ENCOUNTER — Other Ambulatory Visit: Payer: Self-pay

## 2020-09-25 ENCOUNTER — Ambulatory Visit (INDEPENDENT_AMBULATORY_CARE_PROVIDER_SITE_OTHER): Payer: Medicare Other | Admitting: Physician Assistant

## 2020-09-25 VITALS — BP 135/72 | HR 108 | Temp 98.4°F | Resp 20 | Ht 70.0 in | Wt 242.6 lb

## 2020-09-25 DIAGNOSIS — L89610 Pressure ulcer of right heel, unstageable: Secondary | ICD-10-CM | POA: Diagnosis not present

## 2020-09-25 DIAGNOSIS — S98131A Complete traumatic amputation of one right lesser toe, initial encounter: Secondary | ICD-10-CM

## 2020-09-25 NOTE — Progress Notes (Signed)
POST OPERATIVE OFFICE NOTE    CC:  F/u for surgery  HPI:  This is a 75 y.o. male who is s/p Open right fifth toe ray amputation by Dr. Stanford Breed on 08/28/20. This was performed secondary to osteomyelitis of the 5th toe. He had undergone Arteriogram 08/27/20 for evaluation of arterial disease of which no intervention was necessary. He was noted to have adequate flow throughout right lower extremity with some moderate tibial disease but no focal stenosis.   Pt was seen on 09/10/2020 and at that time, he had a wound vac that was being changed three times a week by [facility].  He was found to have a new wound on the top of his right foot and felt this was getting a little bit bigger.  He also had some wounds on the lateral side of the right foot.  Pt felt they were from the wheelchair.  He di not have a lot of pain in his legs.  He takes chronic oxycodone for his back and felt that minimizes his pain in his legs.  He did have occasional sharp shooting pains down the legs.  He has known venous hypertension and so has history of significant swelling of his legs. He does regularly elevate which helps some.  He had a PICC line in the left arm for IV abx and today will be the last day for abx.   His wound was clean with beefy red tissue in the wound bed.    Pt returns today for follow up.  Pt states he has been doing well.  He states that his last dose of abx is today and his PICC will be removed tomorrow.  His wife tells me that the sore on the top of his foot has gotten better and is drying up.    Allergies  Allergen Reactions  . Other Swelling and Other (See Comments)    Farmed Fish (tightness in throat & lip swelling)  . Codeine Rash  . Sulfa Antibiotics Hives    Current Outpatient Medications  Medication Sig Dispense Refill  . albuterol (PROVENTIL) (2.5 MG/3ML) 0.083% nebulizer solution USE 1 VIAL IN NEBULIZER EVERY 6 HOURS AS NEEDED FOR WHEEZING AND FOR SHORTNESS OF BREATH (Patient taking differently: Take  2.5 mg by nebulization every 6 (six) hours as needed for wheezing or shortness of breath.) 150 mL 2  . aspirin EC 81 MG tablet Take 81 mg by mouth daily. Swallow whole.    Marland Kitchen atorvastatin (LIPITOR) 40 MG tablet TAKE 1 TABLET BY MOUTH ONCE DAILY . APPOINTMENT REQUIRED FOR FUTURE REFILLS (Patient taking differently: Take 40 mg by mouth daily.) 30 tablet 5  . Budeson-Glycopyrrol-Formoterol (BREZTRI AEROSPHERE) 160-9-4.8 MCG/ACT AERO Take 2 puffs first thing in am and then another 2 puffs about 12 hours later. 10.7 g 11  . ceFAZolin (ANCEF) IVPB Inject 2 g into the vein every 8 (eight) hours for 24 days. Indication:  Osteomyelitis of right fifth toe  First Dose: Yes Last Day of Therapy:  09/25/2020 Labs - Once weekly:  CBC/D and BMP, Labs - Every other week:  ESR and CRP Method of administration: IV Push Method of administration may be changed at the discretion of home infusion pharmacist based upon assessment of the patient and/or caregiver's ability to self-administer the medication ordered. 72 Units 0  . cyclobenzaprine (FLEXERIL) 10 MG tablet Take 10 mg by mouth at bedtime.    . furosemide (LASIX) 20 MG tablet TAKE 1 TABLET BY MOUTH ONCE DAILY 30 tablet  11  . gabapentin (NEURONTIN) 600 MG tablet Take 600 mg by mouth every 8 (eight) hours.    Marland Kitchen loperamide (IMODIUM) 2 MG capsule Take 1 capsule (2 mg total) by mouth as needed for diarrhea or loose stools. 30 capsule 0  . losartan (COZAAR) 50 MG tablet Take 1 tablet (50 mg total) by mouth daily. 90 tablet 3  . Multiple Vitamin (MULTIVITAMIN WITH MINERALS) TABS tablet Take 1 tablet by mouth daily.    Marland Kitchen oxyCODONE-acetaminophen (PERCOCET) 10-325 MG tablet Take 1 tablet by mouth 2 (two) times daily as needed (back pain.).     Marland Kitchen predniSONE (DELTASONE) 10 MG tablet 2 until better then one daily (Patient taking differently: See admin instructions. 2 until better then one daily) 100 tablet 2  . PROAIR HFA 108 (90 Base) MCG/ACT inhaler INHALE 2 PUFFS BY MOUTH  EVERY 6 HOURS AS NEEDED FOR WHEEZING FOR SHORTNESS OF BREATH (Patient taking differently: Inhale 2 puffs into the lungs every 6 (six) hours as needed for wheezing or shortness of breath.) 18 g 3  . saccharomyces boulardii (FLORASTOR) 250 MG capsule Take 1 capsule (250 mg total) by mouth 2 (two) times daily. 60 capsule 0  . sodium chloride (OCEAN) 0.65 % nasal spray 2 sprays in each nostril as needed    . vitamin B-12 (CYANOCOBALAMIN) 1000 MCG tablet Take 1,000 mcg by mouth daily.     No current facility-administered medications for this visit.     ROS:  See HPI  Physical Exam:  Today's Vitals   09/25/20 1311  BP: 135/72  Pulse: (!) 108  Resp: 20  Temp: 98.4 F (36.9 C)  TempSrc: Temporal  SpO2: 94%  Weight: 242 lb 9.6 oz (110 kg)  Height: _0  (1.778 m)  PainSc: 5    Body mass index is 34.81 kg/m.   Incision:        Extremities:  Brisk right PT and peroneal doppler signals.      Assessment/Plan:  This is a 75 y.o. male who is s/p: Open right fifth toe ray amputation by Dr. Stanford Breed on 08/28/20 here for wound check  -wound vac removed and wound inspected today.  Wound with good granulation tissue and healing nicely.  Wound vac replaced.   -wound on dorsum of foot improving. -pt with good doppler signals right PT and peroneal. -continue vac and will see pt back in 3-4 weeks on Dr. Mora Appl clinic day.  -pt and wife know to call sooner if there are any issues.   Leontine Locket, [facility] [phone]   Clinic MD:  Oneida Alar

## 2020-09-26 DIAGNOSIS — I110 Hypertensive heart disease with heart failure: Secondary | ICD-10-CM | POA: Diagnosis not present

## 2020-09-26 DIAGNOSIS — Z4781 Encounter for orthopedic aftercare following surgical amputation: Secondary | ICD-10-CM | POA: Diagnosis not present

## 2020-09-26 DIAGNOSIS — M6281 Muscle weakness (generalized): Secondary | ICD-10-CM | POA: Diagnosis not present

## 2020-09-26 DIAGNOSIS — J441 Chronic obstructive pulmonary disease with (acute) exacerbation: Secondary | ICD-10-CM | POA: Diagnosis not present

## 2020-09-26 DIAGNOSIS — I8310 Varicose veins of unspecified lower extremity with inflammation: Secondary | ICD-10-CM | POA: Diagnosis not present

## 2020-09-26 DIAGNOSIS — A419 Sepsis, unspecified organism: Secondary | ICD-10-CM | POA: Diagnosis not present

## 2020-09-26 DIAGNOSIS — S91301D Unspecified open wound, right foot, subsequent encounter: Secondary | ICD-10-CM | POA: Diagnosis not present

## 2020-09-26 DIAGNOSIS — I5032 Chronic diastolic (congestive) heart failure: Secondary | ICD-10-CM | POA: Diagnosis not present

## 2020-09-29 DIAGNOSIS — I8310 Varicose veins of unspecified lower extremity with inflammation: Secondary | ICD-10-CM | POA: Diagnosis not present

## 2020-09-29 DIAGNOSIS — Z4781 Encounter for orthopedic aftercare following surgical amputation: Secondary | ICD-10-CM | POA: Diagnosis not present

## 2020-09-29 DIAGNOSIS — S91301D Unspecified open wound, right foot, subsequent encounter: Secondary | ICD-10-CM | POA: Diagnosis not present

## 2020-09-29 DIAGNOSIS — I110 Hypertensive heart disease with heart failure: Secondary | ICD-10-CM | POA: Diagnosis not present

## 2020-09-29 DIAGNOSIS — A419 Sepsis, unspecified organism: Secondary | ICD-10-CM | POA: Diagnosis not present

## 2020-09-29 DIAGNOSIS — I5032 Chronic diastolic (congestive) heart failure: Secondary | ICD-10-CM | POA: Diagnosis not present

## 2020-09-29 DIAGNOSIS — J441 Chronic obstructive pulmonary disease with (acute) exacerbation: Secondary | ICD-10-CM | POA: Diagnosis not present

## 2020-09-29 DIAGNOSIS — M6281 Muscle weakness (generalized): Secondary | ICD-10-CM | POA: Diagnosis not present

## 2020-10-01 DIAGNOSIS — T8189XA Other complications of procedures, not elsewhere classified, initial encounter: Secondary | ICD-10-CM | POA: Diagnosis not present

## 2020-10-01 DIAGNOSIS — S91301D Unspecified open wound, right foot, subsequent encounter: Secondary | ICD-10-CM | POA: Diagnosis not present

## 2020-10-01 DIAGNOSIS — I110 Hypertensive heart disease with heart failure: Secondary | ICD-10-CM | POA: Diagnosis not present

## 2020-10-01 DIAGNOSIS — I8310 Varicose veins of unspecified lower extremity with inflammation: Secondary | ICD-10-CM | POA: Diagnosis not present

## 2020-10-01 DIAGNOSIS — J441 Chronic obstructive pulmonary disease with (acute) exacerbation: Secondary | ICD-10-CM | POA: Diagnosis not present

## 2020-10-01 DIAGNOSIS — Z4781 Encounter for orthopedic aftercare following surgical amputation: Secondary | ICD-10-CM | POA: Diagnosis not present

## 2020-10-01 DIAGNOSIS — I5032 Chronic diastolic (congestive) heart failure: Secondary | ICD-10-CM | POA: Diagnosis not present

## 2020-10-01 DIAGNOSIS — M6281 Muscle weakness (generalized): Secondary | ICD-10-CM | POA: Diagnosis not present

## 2020-10-03 DIAGNOSIS — I8310 Varicose veins of unspecified lower extremity with inflammation: Secondary | ICD-10-CM | POA: Diagnosis not present

## 2020-10-03 DIAGNOSIS — Z4781 Encounter for orthopedic aftercare following surgical amputation: Secondary | ICD-10-CM | POA: Diagnosis not present

## 2020-10-03 DIAGNOSIS — S91301D Unspecified open wound, right foot, subsequent encounter: Secondary | ICD-10-CM | POA: Diagnosis not present

## 2020-10-03 DIAGNOSIS — I110 Hypertensive heart disease with heart failure: Secondary | ICD-10-CM | POA: Diagnosis not present

## 2020-10-03 DIAGNOSIS — I5032 Chronic diastolic (congestive) heart failure: Secondary | ICD-10-CM | POA: Diagnosis not present

## 2020-10-03 DIAGNOSIS — M6281 Muscle weakness (generalized): Secondary | ICD-10-CM | POA: Diagnosis not present

## 2020-10-03 DIAGNOSIS — J441 Chronic obstructive pulmonary disease with (acute) exacerbation: Secondary | ICD-10-CM | POA: Diagnosis not present

## 2020-10-06 ENCOUNTER — Telehealth: Payer: Self-pay | Admitting: Cardiovascular Disease

## 2020-10-06 DIAGNOSIS — Z4781 Encounter for orthopedic aftercare following surgical amputation: Secondary | ICD-10-CM | POA: Diagnosis not present

## 2020-10-06 DIAGNOSIS — M6281 Muscle weakness (generalized): Secondary | ICD-10-CM | POA: Diagnosis not present

## 2020-10-06 DIAGNOSIS — I110 Hypertensive heart disease with heart failure: Secondary | ICD-10-CM | POA: Diagnosis not present

## 2020-10-06 DIAGNOSIS — I5032 Chronic diastolic (congestive) heart failure: Secondary | ICD-10-CM | POA: Diagnosis not present

## 2020-10-06 DIAGNOSIS — J441 Chronic obstructive pulmonary disease with (acute) exacerbation: Secondary | ICD-10-CM | POA: Diagnosis not present

## 2020-10-06 DIAGNOSIS — I8310 Varicose veins of unspecified lower extremity with inflammation: Secondary | ICD-10-CM | POA: Diagnosis not present

## 2020-10-06 DIAGNOSIS — S91301D Unspecified open wound, right foot, subsequent encounter: Secondary | ICD-10-CM | POA: Diagnosis not present

## 2020-10-06 MED ORDER — FUROSEMIDE 20 MG PO TABS: 20.0000 mg | ORAL_TABLET | Freq: Every day | ORAL | 0 refills | Status: DC

## 2020-10-06 NOTE — Telephone Encounter (Signed)
New Message °

## 2020-10-06 NOTE — Telephone Encounter (Signed)
Spoke with Maudie Mercury with [facility] regarding pt's lasix medication. Pt was told by our triage RN to take one additional 20mg  tablet of lasix for 5lbs of weight gain. Pt continued to take double (40mg ) of lasix since 09/22/20, when he was advised to take extra dose. Pt has now almost run out of his medication and is unable to get a refill from the pharmacy because per instructions it is too early for pt to get a refill.  Maudie Mercury would like clarification on what the pt should be taken. Renold Don that pt should only take lasix 20mg  once daily as prescribed.  Will send in one 30 day supply of lasix to pt's pharmacy. Maudie Mercury will follow up with pt on Wednesday to insure he is taking the correct dosage and to weigh him. Kim verbalizes understanding.

## 2020-10-08 DIAGNOSIS — Z4781 Encounter for orthopedic aftercare following surgical amputation: Secondary | ICD-10-CM | POA: Diagnosis not present

## 2020-10-08 DIAGNOSIS — I5032 Chronic diastolic (congestive) heart failure: Secondary | ICD-10-CM | POA: Diagnosis not present

## 2020-10-08 DIAGNOSIS — J441 Chronic obstructive pulmonary disease with (acute) exacerbation: Secondary | ICD-10-CM | POA: Diagnosis not present

## 2020-10-08 DIAGNOSIS — I110 Hypertensive heart disease with heart failure: Secondary | ICD-10-CM | POA: Diagnosis not present

## 2020-10-08 DIAGNOSIS — M6281 Muscle weakness (generalized): Secondary | ICD-10-CM | POA: Diagnosis not present

## 2020-10-08 DIAGNOSIS — S91301D Unspecified open wound, right foot, subsequent encounter: Secondary | ICD-10-CM | POA: Diagnosis not present

## 2020-10-08 DIAGNOSIS — I8310 Varicose veins of unspecified lower extremity with inflammation: Secondary | ICD-10-CM | POA: Diagnosis not present

## 2020-10-10 DIAGNOSIS — M6281 Muscle weakness (generalized): Secondary | ICD-10-CM | POA: Diagnosis not present

## 2020-10-10 DIAGNOSIS — I110 Hypertensive heart disease with heart failure: Secondary | ICD-10-CM | POA: Diagnosis not present

## 2020-10-10 DIAGNOSIS — Z4781 Encounter for orthopedic aftercare following surgical amputation: Secondary | ICD-10-CM | POA: Diagnosis not present

## 2020-10-10 DIAGNOSIS — I5032 Chronic diastolic (congestive) heart failure: Secondary | ICD-10-CM | POA: Diagnosis not present

## 2020-10-10 DIAGNOSIS — S91301D Unspecified open wound, right foot, subsequent encounter: Secondary | ICD-10-CM | POA: Diagnosis not present

## 2020-10-10 DIAGNOSIS — J441 Chronic obstructive pulmonary disease with (acute) exacerbation: Secondary | ICD-10-CM | POA: Diagnosis not present

## 2020-10-10 DIAGNOSIS — I8310 Varicose veins of unspecified lower extremity with inflammation: Secondary | ICD-10-CM | POA: Diagnosis not present

## 2020-10-13 DIAGNOSIS — Z4781 Encounter for orthopedic aftercare following surgical amputation: Secondary | ICD-10-CM | POA: Diagnosis not present

## 2020-10-13 DIAGNOSIS — M6281 Muscle weakness (generalized): Secondary | ICD-10-CM | POA: Diagnosis not present

## 2020-10-13 DIAGNOSIS — J9611 Chronic respiratory failure with hypoxia: Secondary | ICD-10-CM | POA: Diagnosis not present

## 2020-10-13 DIAGNOSIS — S98131D Complete traumatic amputation of one right lesser toe, subsequent encounter: Secondary | ICD-10-CM | POA: Diagnosis not present

## 2020-10-13 DIAGNOSIS — Z8739 Personal history of other diseases of the musculoskeletal system and connective tissue: Secondary | ICD-10-CM | POA: Diagnosis not present

## 2020-10-13 DIAGNOSIS — J449 Chronic obstructive pulmonary disease, unspecified: Secondary | ICD-10-CM | POA: Diagnosis not present

## 2020-10-13 DIAGNOSIS — I8310 Varicose veins of unspecified lower extremity with inflammation: Secondary | ICD-10-CM | POA: Diagnosis not present

## 2020-10-13 DIAGNOSIS — S91301D Unspecified open wound, right foot, subsequent encounter: Secondary | ICD-10-CM | POA: Diagnosis not present

## 2020-10-13 DIAGNOSIS — J441 Chronic obstructive pulmonary disease with (acute) exacerbation: Secondary | ICD-10-CM | POA: Diagnosis not present

## 2020-10-13 DIAGNOSIS — I110 Hypertensive heart disease with heart failure: Secondary | ICD-10-CM | POA: Diagnosis not present

## 2020-10-13 DIAGNOSIS — I5032 Chronic diastolic (congestive) heart failure: Secondary | ICD-10-CM | POA: Diagnosis not present

## 2020-10-15 ENCOUNTER — Ambulatory Visit: Payer: Medicare Other | Admitting: Family

## 2020-10-15 ENCOUNTER — Encounter: Payer: Self-pay | Admitting: Family

## 2020-10-15 ENCOUNTER — Other Ambulatory Visit: Payer: Self-pay

## 2020-10-15 VITALS — BP 121/67 | HR 103 | Temp 97.6°F

## 2020-10-15 DIAGNOSIS — M869 Osteomyelitis, unspecified: Secondary | ICD-10-CM

## 2020-10-15 DIAGNOSIS — I8310 Varicose veins of unspecified lower extremity with inflammation: Secondary | ICD-10-CM | POA: Diagnosis not present

## 2020-10-15 DIAGNOSIS — Z452 Encounter for adjustment and management of vascular access device: Secondary | ICD-10-CM | POA: Diagnosis not present

## 2020-10-15 DIAGNOSIS — S91301D Unspecified open wound, right foot, subsequent encounter: Secondary | ICD-10-CM | POA: Diagnosis not present

## 2020-10-15 DIAGNOSIS — M6281 Muscle weakness (generalized): Secondary | ICD-10-CM | POA: Diagnosis not present

## 2020-10-15 DIAGNOSIS — I5032 Chronic diastolic (congestive) heart failure: Secondary | ICD-10-CM | POA: Diagnosis not present

## 2020-10-15 DIAGNOSIS — J441 Chronic obstructive pulmonary disease with (acute) exacerbation: Secondary | ICD-10-CM | POA: Diagnosis not present

## 2020-10-15 DIAGNOSIS — Z4781 Encounter for orthopedic aftercare following surgical amputation: Secondary | ICD-10-CM | POA: Diagnosis not present

## 2020-10-15 DIAGNOSIS — I110 Hypertensive heart disease with heart failure: Secondary | ICD-10-CM | POA: Diagnosis not present

## 2020-10-15 NOTE — Assessment & Plan Note (Signed)
Central line removed on 5/5 without complication.

## 2020-10-15 NOTE — Assessment & Plan Note (Signed)
Mr. Liby has completed antimicrobial therapy for osteomyelitis and continues to heal well after being off antibiotics for the last 3 weeks.  No indication for further antibiotics needed.  Continue wound care per vascular surgery.  Discussed continued need for protein to allow for adequate wound healing.  He does have peripheral vascular disease which may lead to complicated healing and potentially increased risk of infection in the future.  No further follow-up with ID needed at this time.

## 2020-10-15 NOTE — Patient Instructions (Signed)
Nice to see you.  No further antibiotics are needed at this time.  Continue wound care and follow up with Vascular Surgery as planned.   Continue to eat protein to help with wound healing.   Follow up with ID as needed.   Have a great day and stay safe!

## 2020-10-15 NOTE — Progress Notes (Signed)
Subjective:    Patient ID: Joel Perez, male    DOB: [birth year], 75 y.o.   MRN: [id number]  Chief Complaint  Patient presents with  . Follow-up    Antibiotic-associated diarrhea     HPI:  Joel Perez is a 75 y.o. male with osteomyelitis of the right fifth MTP joint s/p ray amputation and cultures positive for MSSA and Group B streptococcus last seen on 4/26 near the completion of treatment with PICC line removed on 5/5 and normal inflammatory markers. Last seen by Vascular Surgery on 5/5 with good wound healing. Here today for follow up.   Joel Perez continues to perform wound care to the right foot as directed by Vascular Surgery.   Allergies  Allergen Reactions  . Other Swelling and Other (See Comments)    Farmed Fish (tightness in throat & lip swelling)  . Codeine Rash  . Sulfa Antibiotics Hives      Outpatient Medications Prior to Visit  Medication Sig Dispense Refill  . albuterol (PROVENTIL) (2.5 MG/3ML) 0.083% nebulizer solution USE 1 VIAL IN NEBULIZER EVERY 6 HOURS AS NEEDED FOR WHEEZING AND FOR SHORTNESS OF BREATH (Patient taking differently: Take 2.5 mg by nebulization every 6 (six) hours as needed for wheezing or shortness of breath.) 150 mL 2  . aspirin EC 81 MG tablet Take 81 mg by mouth daily. Swallow whole.    Marland Kitchen atorvastatin (LIPITOR) 40 MG tablet TAKE 1 TABLET BY MOUTH ONCE DAILY . APPOINTMENT REQUIRED FOR FUTURE REFILLS (Patient taking differently: Take 40 mg by mouth daily.) 30 tablet 5  . Budeson-Glycopyrrol-Formoterol (BREZTRI AEROSPHERE) 160-9-4.8 MCG/ACT AERO Take 2 puffs first thing in am and then another 2 puffs about 12 hours later. 10.7 g 11  . cyclobenzaprine (FLEXERIL) 10 MG tablet Take 10 mg by mouth at bedtime.    . furosemide (LASIX) 20 MG tablet TAKE 1 TABLET BY MOUTH ONCE DAILY 30 tablet 11  . furosemide (LASIX) 20 MG tablet Take 1 tablet (20 mg total) by mouth daily. 30 tablet 0  . gabapentin (NEURONTIN) 600 MG tablet Take 600 mg by mouth  every 8 (eight) hours.    Marland Kitchen loperamide (IMODIUM) 2 MG capsule Take 1 capsule (2 mg total) by mouth as needed for diarrhea or loose stools. 30 capsule 0  . losartan (COZAAR) 50 MG tablet Take 1 tablet (50 mg total) by mouth daily. 90 tablet 3  . Multiple Vitamin (MULTIVITAMIN WITH MINERALS) TABS tablet Take 1 tablet by mouth daily.    Marland Kitchen oxyCODONE-acetaminophen (PERCOCET) 10-325 MG tablet Take 1 tablet by mouth 2 (two) times daily as needed (back pain.).     Marland Kitchen predniSONE (DELTASONE) 10 MG tablet 2 until better then one daily (Patient taking differently: See admin instructions. 2 until better then one daily) 100 tablet 2  . PROAIR HFA 108 (90 Base) MCG/ACT inhaler INHALE 2 PUFFS BY MOUTH EVERY 6 HOURS AS NEEDED FOR WHEEZING FOR SHORTNESS OF BREATH (Patient taking differently: Inhale 2 puffs into the lungs every 6 (six) hours as needed for wheezing or shortness of breath.) 18 g 3  . saccharomyces boulardii (FLORASTOR) 250 MG capsule Take 1 capsule (250 mg total) by mouth 2 (two) times daily. 60 capsule 0  . sodium chloride (OCEAN) 0.65 % nasal spray 2 sprays in each nostril as needed    . vitamin B-12 (CYANOCOBALAMIN) 1000 MCG tablet Take 1,000 mcg by mouth daily.    . sildenafil (REVATIO) 20 MG tablet Take 20 mg by mouth as  needed.    Marland Kitchen ceFAZolin (ANCEF) IVPB Inject 2 g into the vein every 8 (eight) hours for 24 days. Indication:  Osteomyelitis of right fifth toe  First Dose: Yes Last Day of Therapy:  09/25/2020 Labs - Once weekly:  CBC/D and BMP, Labs - Every other week:  ESR and CRP Method of administration: IV Push Method of administration may be changed at the discretion of home infusion pharmacist based upon assessment of the patient and/or caregiver's ability to self-administer the medication ordered. 72 Units 0   No facility-administered medications prior to visit.     Past Medical History:  Diagnosis Date  . 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 BPH (benign prostatic  hyperplasia)   . Capsular cataract of left eye   . COPD (chronic obstructive pulmonary disease) (HCC)    Dr. [name]  . Current smoker    Long-term smoker. Not interested in quitting  . Diverticulosis    With intermittent diverticulitis  . Erectile dysfunction   . Essential hypertension    Several recorded blood pressures greater than 140/90.  . H/O mitral valve prolapse    By report, but not confirmed by echo.  . Low back pain    Chronic. Followed by Dr. Suella Broad  . Mild aortic regurgitation 12/2013   Mild to moderate aortic regurgitation on echo  . Neuropathy      Past Surgical History:  Procedure Laterality Date  . ABDOMINAL AORTOGRAM W/LOWER EXTREMITY N/A 08/27/2020   Procedure: ABDOMINAL AORTOGRAM W/LOWER EXTREMITY;  Surgeon: Cherre Robins, MD;  Location: [facility];  Service: Cardiovascular;  Laterality: N/A;  . AMPUTATION Right 08/28/2020   Procedure: AMPUTATION RAY FIFTH TOE;  Surgeon: Cherre Robins, MD;  Location: [facility];  Service: Vascular;  Laterality: Right;  . APPLICATION OF WOUND VAC Right 08/28/2020   Procedure: APPLICATION OF WOUND VAC, right foot;  Surgeon: Cherre Robins, MD;  Location: [facility];  Service: Vascular;  Laterality: Right;  . COLON SURGERY  2000  . COLONOSCOPY WITH PROPOFOL N/A 03/18/2020   Procedure: COLONOSCOPY WITH PROPOFOL;  Surgeon: Otis Brace, MD;  Location: [facility];  Service: Gastroenterology;  Laterality: N/A;  . ELBOW SURGERY    . POLYPECTOMY  03/18/2020   Procedure: POLYPECTOMY;  Surgeon: Otis Brace, MD;  Location: [facility];  Service: Gastroenterology;;  . TRANSTHORACIC ECHOCARDIOGRAM  12/29/2016   EF 60-65%. Moderate LVH. Mild aortic valve calcification. Mild to moderate regurgitation with no stenosis. Mitral valve annular calcification with no comment of prolapse or regurgitation.       Review of Systems  Constitutional: Negative for chills, diaphoresis, fatigue and fever.  Respiratory: Negative for  cough, chest tightness, shortness of breath and wheezing.   Cardiovascular: Negative for chest pain.  Gastrointestinal: Negative for abdominal pain, diarrhea, nausea and vomiting.      Objective:    BP 121/67   Pulse (!) 103   Temp 97.6 F (36.4 C) (Oral)  Nursing note and vital signs reviewed.  Physical Exam Constitutional:      General: He is not in acute distress.    Appearance: He is well-developed.     Interventions: Nasal cannula in place.     Comments: Seated in the wheelchair; pleasant.   Cardiovascular:     Rate and Rhythm: Normal rate and regular rhythm.     Heart sounds: Normal heart sounds.  Musculoskeletal:     Comments: Wound vac to right foot. Functioning appropriately.  Skin:    General: Skin is warm and dry.  Neurological:     Mental Status: He is alert.      Depression screen PHQ 2/9 11/04/2015  Decreased Interest 1  Down, Depressed, Hopeless 0  PHQ - 2 Score 1  Altered sleeping 0  Tired, decreased energy 2  Change in appetite 0  Feeling bad or failure about yourself  0  Trouble concentrating 0  Moving slowly or fidgety/restless 0  Suicidal thoughts 0  PHQ-9 Score 3  Difficult doing work/chores Somewhat difficult       Assessment & Plan:    Patient Active Problem List   Diagnosis Date Noted  . Antibiotic-associated diarrhea 09/16/2020  . PICC (peripherally inserted central catheter) in place 09/16/2020  . Osteomyelitis ([place]) 08/25/2020  . Multiple pulmonary nodules determined by computed tomography of lung 08/14/2020  . Community acquired pneumonia of right lower lobe of lung   . Pressure injury of skin 07/23/2020  . Sepsis ([place]) 07/22/2020  . Edema of both legs 12/19/2018  . PVD (peripheral vascular disease) ([place]) 12/19/2018  . DOE (dyspnea on exertion) 11/14/2018  . Chronic respiratory failure with hypoxia ([place]) 04/06/2018  . Decreased pedal pulses 01/17/2017  . Mild aortic valve regurgitation 01/17/2017  . Leg swelling 12/30/2016   . Neuropathy, alcoholic ([place]) 77/07/4033  . COPD III with marked reversibility 12/27/2012     Problem List Items Addressed This Visit      Musculoskeletal and Integument   Osteomyelitis ([place]) - Primary    Joel Perez has completed antimicrobial therapy for osteomyelitis and continues to heal well after being off antibiotics for the last 3 weeks.  No indication for further antibiotics needed.  Continue wound care per vascular surgery.  Discussed continued need for protein to allow for adequate wound healing.  He does have peripheral vascular disease which may lead to complicated healing and potentially increased risk of infection in the future.  No further follow-up with ID needed at this time.        Other   PICC (peripherally inserted central catheter) in place    Central line removed on 5/5 without complication.          I have discontinued Joel Perez's ceFAZolin. I am also having him maintain his oxyCODONE-acetaminophen, losartan, albuterol, gabapentin, vitamin B-12, multivitamin with minerals, aspirin EC, ProAir HFA, atorvastatin, Breztri Aerosphere, predniSONE, cyclobenzaprine, loperamide, saccharomyces boulardii, sodium chloride, furosemide, furosemide, and sildenafil.   No orders of the defined types were placed in this encounter.    Follow-up: Return if symptoms worsen or fail to improve.   Terri Piedra, MSN, FNP-C Nurse Practitioner [facility] for Infectious Disease [place] number: [phone]

## 2020-10-16 DIAGNOSIS — I110 Hypertensive heart disease with heart failure: Secondary | ICD-10-CM | POA: Diagnosis not present

## 2020-10-16 DIAGNOSIS — Z4781 Encounter for orthopedic aftercare following surgical amputation: Secondary | ICD-10-CM | POA: Diagnosis not present

## 2020-10-16 DIAGNOSIS — I5032 Chronic diastolic (congestive) heart failure: Secondary | ICD-10-CM | POA: Diagnosis not present

## 2020-10-16 DIAGNOSIS — M6281 Muscle weakness (generalized): Secondary | ICD-10-CM | POA: Diagnosis not present

## 2020-10-16 DIAGNOSIS — I8310 Varicose veins of unspecified lower extremity with inflammation: Secondary | ICD-10-CM | POA: Diagnosis not present

## 2020-10-16 DIAGNOSIS — J441 Chronic obstructive pulmonary disease with (acute) exacerbation: Secondary | ICD-10-CM | POA: Diagnosis not present

## 2020-10-16 DIAGNOSIS — S91301D Unspecified open wound, right foot, subsequent encounter: Secondary | ICD-10-CM | POA: Diagnosis not present

## 2020-10-17 DIAGNOSIS — I110 Hypertensive heart disease with heart failure: Secondary | ICD-10-CM | POA: Diagnosis not present

## 2020-10-17 DIAGNOSIS — J441 Chronic obstructive pulmonary disease with (acute) exacerbation: Secondary | ICD-10-CM | POA: Diagnosis not present

## 2020-10-17 DIAGNOSIS — M6281 Muscle weakness (generalized): Secondary | ICD-10-CM | POA: Diagnosis not present

## 2020-10-17 DIAGNOSIS — Z4781 Encounter for orthopedic aftercare following surgical amputation: Secondary | ICD-10-CM | POA: Diagnosis not present

## 2020-10-17 DIAGNOSIS — S91301D Unspecified open wound, right foot, subsequent encounter: Secondary | ICD-10-CM | POA: Diagnosis not present

## 2020-10-17 DIAGNOSIS — I8310 Varicose veins of unspecified lower extremity with inflammation: Secondary | ICD-10-CM | POA: Diagnosis not present

## 2020-10-17 DIAGNOSIS — I5032 Chronic diastolic (congestive) heart failure: Secondary | ICD-10-CM | POA: Diagnosis not present

## 2020-10-21 ENCOUNTER — Other Ambulatory Visit: Payer: Self-pay

## 2020-10-21 ENCOUNTER — Ambulatory Visit: Payer: Medicare Other | Admitting: Cardiovascular Disease

## 2020-10-21 ENCOUNTER — Ambulatory Visit (INDEPENDENT_AMBULATORY_CARE_PROVIDER_SITE_OTHER): Payer: Medicare Other | Admitting: Physician Assistant

## 2020-10-21 VITALS — BP 132/72 | HR 110 | Temp 98.6°F | Resp 20 | Ht 70.0 in | Wt 241.6 lb

## 2020-10-21 DIAGNOSIS — S98131A Complete traumatic amputation of one right lesser toe, initial encounter: Secondary | ICD-10-CM

## 2020-10-21 NOTE — Progress Notes (Signed)
POST OPERATIVE OFFICE NOTE    CC:  F/u for surgery  HPI:  This is a 75 y.o. male who is s/p  Open right fifth toe ray amputationby Dr. Stanford Breed on 08/28/20. This was performed secondary to osteomyelitis of the 5th toe. He had undergone Arteriogram 08/27/20 for evaluation of arterial disease of which no intervention was necessary. He was noted to have adequate flow throughout right lower extremity with some moderate tibial disease but no focal stenosis.   Pt was last seen on 09/25/2020 and was doing well.  He had taken his last dose of abx and his PICC line was being removed.  His wounds were healing nicely.    Pt returns today for follow up.  Pt states he is still having the vac changed.  He has not had any fevers.  He states the great toe nail has occasional drainage.   Allergies  Allergen Reactions  . Other Swelling and Other (See Comments)    Farmed Fish (tightness in throat & lip swelling)  . Codeine Rash  . Sulfa Antibiotics Hives    Current Outpatient Medications  Medication Sig Dispense Refill  . albuterol (PROVENTIL) (2.5 MG/3ML) 0.083% nebulizer solution USE 1 VIAL IN NEBULIZER EVERY 6 HOURS AS NEEDED FOR WHEEZING AND FOR SHORTNESS OF BREATH (Patient taking differently: Take 2.5 mg by nebulization every 6 (six) hours as needed for wheezing or shortness of breath.) 150 mL 2  . aspirin EC 81 MG tablet Take 81 mg by mouth daily. Swallow whole.    Marland Kitchen atorvastatin (LIPITOR) 40 MG tablet TAKE 1 TABLET BY MOUTH ONCE DAILY . APPOINTMENT REQUIRED FOR FUTURE REFILLS (Patient taking differently: Take 40 mg by mouth daily.) 30 tablet 5  . Budeson-Glycopyrrol-Formoterol (BREZTRI AEROSPHERE) 160-9-4.8 MCG/ACT AERO Take 2 puffs first thing in am and then another 2 puffs about 12 hours later. 10.7 g 11  . cyclobenzaprine (FLEXERIL) 10 MG tablet Take 10 mg by mouth at bedtime.    . furosemide (LASIX) 20 MG tablet TAKE 1 TABLET BY MOUTH ONCE DAILY 30 tablet 11  . furosemide (LASIX) 20 MG tablet Take 1  tablet (20 mg total) by mouth daily. 30 tablet 0  . gabapentin (NEURONTIN) 600 MG tablet Take 600 mg by mouth every 8 (eight) hours.    Marland Kitchen loperamide (IMODIUM) 2 MG capsule Take 1 capsule (2 mg total) by mouth as needed for diarrhea or loose stools. 30 capsule 0  . losartan (COZAAR) 50 MG tablet Take 1 tablet (50 mg total) by mouth daily. 90 tablet 3  . Multiple Vitamin (MULTIVITAMIN WITH MINERALS) TABS tablet Take 1 tablet by mouth daily.    Marland Kitchen oxyCODONE-acetaminophen (PERCOCET) 10-325 MG tablet Take 1 tablet by mouth 2 (two) times daily as needed (back pain.).     Marland Kitchen predniSONE (DELTASONE) 10 MG tablet 2 until better then one daily (Patient taking differently: See admin instructions. 2 until better then one daily) 100 tablet 2  . PROAIR HFA 108 (90 Base) MCG/ACT inhaler INHALE 2 PUFFS BY MOUTH EVERY 6 HOURS AS NEEDED FOR WHEEZING FOR SHORTNESS OF BREATH (Patient taking differently: Inhale 2 puffs into the lungs every 6 (six) hours as needed for wheezing or shortness of breath.) 18 g 3  . saccharomyces boulardii (FLORASTOR) 250 MG capsule Take 1 capsule (250 mg total) by mouth 2 (two) times daily. 60 capsule 0  . sildenafil (REVATIO) 20 MG tablet Take 20 mg by mouth as needed.    . sodium chloride (OCEAN) 0.65 % nasal  spray 2 sprays in each nostril as needed    . vitamin B-12 (CYANOCOBALAMIN) 1000 MCG tablet Take 1,000 mcg by mouth daily.     No current facility-administered medications for this visit.     ROS:  See HPI  Physical Exam:  Today's Vitals   10/21/20 1433  BP: 132/72  Pulse: (!) 110  Resp: 20  Temp: 98.6 F (37 C)  TempSrc: Temporal  SpO2: 98%  Weight: 241 lb 9.6 oz (109.6 kg)  Height: 5\' 10"  (1.778 m)  PainSc: 0-No pain   Body mass index is 34.67 kg/m.   Incision:      Extremities:  Brisk doppler signal right DP/PT/peroneal; wound on dorsum of foot unchanged.      Assessment/Plan:  This is a 74 y.o. male who is s/p:  Open right fifth toe ray amputationby  Dr. Stanford Breed on 08/28/20. This was performed secondary to osteomyelitis of the 5th toe. He had undergone Arteriogram 08/27/20 for evaluation of arterial disease of which no intervention was necessary. He was noted to have adequate flow throughout right lower extremity with some moderate tibial disease but no focal stenosis.   -pt with brisk doppler signals right DP/PT/peroneal.  -pt's toe amp site looks good with beefy red granulation tissue.  Pt seen with Dr. Carlis Abbott and at this time, will d/c the wound vac and change to Aquacel q48 hours and this will be communicated to his [facility] agency.   -we will have him return in 6-8 weeks to check his wound.  He will call sooner if he has any issues before then.   Leontine Locket, [facility] [phone]   Clinic MD:  Pt seen with Dr. Stanford Breed

## 2020-10-22 DIAGNOSIS — S91301D Unspecified open wound, right foot, subsequent encounter: Secondary | ICD-10-CM | POA: Diagnosis not present

## 2020-10-22 DIAGNOSIS — J441 Chronic obstructive pulmonary disease with (acute) exacerbation: Secondary | ICD-10-CM | POA: Diagnosis not present

## 2020-10-22 DIAGNOSIS — I8310 Varicose veins of unspecified lower extremity with inflammation: Secondary | ICD-10-CM | POA: Diagnosis not present

## 2020-10-22 DIAGNOSIS — I5032 Chronic diastolic (congestive) heart failure: Secondary | ICD-10-CM | POA: Diagnosis not present

## 2020-10-22 DIAGNOSIS — Z4781 Encounter for orthopedic aftercare following surgical amputation: Secondary | ICD-10-CM | POA: Diagnosis not present

## 2020-10-22 DIAGNOSIS — T8189XA Other complications of procedures, not elsewhere classified, initial encounter: Secondary | ICD-10-CM | POA: Diagnosis not present

## 2020-10-22 DIAGNOSIS — I110 Hypertensive heart disease with heart failure: Secondary | ICD-10-CM | POA: Diagnosis not present

## 2020-10-22 DIAGNOSIS — M6281 Muscle weakness (generalized): Secondary | ICD-10-CM | POA: Diagnosis not present

## 2020-10-23 DIAGNOSIS — L89610 Pressure ulcer of right heel, unstageable: Secondary | ICD-10-CM | POA: Diagnosis not present

## 2020-10-24 DIAGNOSIS — I5032 Chronic diastolic (congestive) heart failure: Secondary | ICD-10-CM | POA: Diagnosis not present

## 2020-10-24 DIAGNOSIS — S91301D Unspecified open wound, right foot, subsequent encounter: Secondary | ICD-10-CM | POA: Diagnosis not present

## 2020-10-24 DIAGNOSIS — M6281 Muscle weakness (generalized): Secondary | ICD-10-CM | POA: Diagnosis not present

## 2020-10-24 DIAGNOSIS — I110 Hypertensive heart disease with heart failure: Secondary | ICD-10-CM | POA: Diagnosis not present

## 2020-10-24 DIAGNOSIS — Z4781 Encounter for orthopedic aftercare following surgical amputation: Secondary | ICD-10-CM | POA: Diagnosis not present

## 2020-10-24 DIAGNOSIS — J441 Chronic obstructive pulmonary disease with (acute) exacerbation: Secondary | ICD-10-CM | POA: Diagnosis not present

## 2020-10-24 DIAGNOSIS — I8310 Varicose veins of unspecified lower extremity with inflammation: Secondary | ICD-10-CM | POA: Diagnosis not present

## 2020-10-27 DIAGNOSIS — I5032 Chronic diastolic (congestive) heart failure: Secondary | ICD-10-CM | POA: Diagnosis not present

## 2020-10-27 DIAGNOSIS — Z4781 Encounter for orthopedic aftercare following surgical amputation: Secondary | ICD-10-CM | POA: Diagnosis not present

## 2020-10-27 DIAGNOSIS — I110 Hypertensive heart disease with heart failure: Secondary | ICD-10-CM | POA: Diagnosis not present

## 2020-10-27 DIAGNOSIS — J441 Chronic obstructive pulmonary disease with (acute) exacerbation: Secondary | ICD-10-CM | POA: Diagnosis not present

## 2020-10-27 DIAGNOSIS — I8310 Varicose veins of unspecified lower extremity with inflammation: Secondary | ICD-10-CM | POA: Diagnosis not present

## 2020-10-27 DIAGNOSIS — M6281 Muscle weakness (generalized): Secondary | ICD-10-CM | POA: Diagnosis not present

## 2020-10-27 DIAGNOSIS — S91301D Unspecified open wound, right foot, subsequent encounter: Secondary | ICD-10-CM | POA: Diagnosis not present

## 2020-10-28 ENCOUNTER — Telehealth: Payer: Self-pay

## 2020-10-28 ENCOUNTER — Telehealth: Payer: Self-pay | Admitting: Internal Medicine

## 2020-10-28 DIAGNOSIS — S91301D Unspecified open wound, right foot, subsequent encounter: Secondary | ICD-10-CM | POA: Diagnosis not present

## 2020-10-28 DIAGNOSIS — M6281 Muscle weakness (generalized): Secondary | ICD-10-CM | POA: Diagnosis not present

## 2020-10-28 DIAGNOSIS — I110 Hypertensive heart disease with heart failure: Secondary | ICD-10-CM | POA: Diagnosis not present

## 2020-10-28 DIAGNOSIS — Z4781 Encounter for orthopedic aftercare following surgical amputation: Secondary | ICD-10-CM | POA: Diagnosis not present

## 2020-10-28 DIAGNOSIS — I5032 Chronic diastolic (congestive) heart failure: Secondary | ICD-10-CM | POA: Diagnosis not present

## 2020-10-28 DIAGNOSIS — J441 Chronic obstructive pulmonary disease with (acute) exacerbation: Secondary | ICD-10-CM | POA: Diagnosis not present

## 2020-10-28 DIAGNOSIS — I8310 Varicose veins of unspecified lower extremity with inflammation: Secondary | ICD-10-CM | POA: Diagnosis not present

## 2020-10-28 NOTE — Telephone Encounter (Addendum)
LMTCB for RN  Spoke with the pt and notified of response per Dr [name]   Dr [name]- the amb ref for covid treatment is no longer an option. Per Dr Matilde Bash email the MD has to place order for what medication they want pt to receive. Thanks   [5:17 PM] Ashley Akin Providers may place orders through the [facility] or by faxing a completed order sheet to [facility] at [employer]. Fax number: [phone]. For further questions, please reach out through [email or web address] address. (The order form for bebtelovimab is available here.)  like 1 Folder shared via [employer]

## 2020-10-28 NOTE — Telephone Encounter (Addendum)
Called and spoke with Per Edgardo Roys, [facility],pt tested positive for covid yesterday- running 90-92% on 4L O2, temp of 100.8, pulse 108-110, resp 22.  Maudie Mercury stated there was some wheezing lung sounds. Patient has been having bodyaches, chills, diarrhea since Saturday 10/25/20.   Called and spoke with patient who states he's not having shortness of breath at this time and he titrates his O2 as needed and that he checked his temp a little while ago and it was 98.7. Patient aware to seek emergency care if unable to keep O2 sats up and/or symptoms worsen. Patient states he is doing his inhalers. Patient currently O2 level was 94% on 4 Liters per patient when checked while speaking on the phone.  Also Patient states he got a statement from [facility] that they will no longer be covering Dr [name] so patient states that's why he hasn't made any appointment.   Dr [name] please advise

## 2020-10-28 NOTE — Telephone Encounter (Signed)
Refer to [facility]

## 2020-10-28 NOTE — Telephone Encounter (Signed)
Let's check on him in am and if remains minimally symptomatic no rx, if any worse rx paxlovid

## 2020-10-28 NOTE — Telephone Encounter (Signed)
Gave verbal orders to [place] [facility] for wound care order change  As follows - d/c the wound vac and change to Aquacel q48 hours to R fifth toe ray amputation

## 2020-10-29 NOTE — Telephone Encounter (Signed)
Ok  / holding paxlovid for now/ pt aware to call back prn

## 2020-10-29 NOTE — Telephone Encounter (Signed)
Spoke with the pt. He states not really having any respiratory symptoms now. He has no fever- temp after the nurse left him yesterday was 97.9. His sats are normal. No aches. He is having diarrhea this morning and that is the only thing bothering him. I advised try immodium otc and make sure to increase fluids, gatorade. He verbalized understanding. He will call back if has any further issues.

## 2020-11-03 DIAGNOSIS — M6281 Muscle weakness (generalized): Secondary | ICD-10-CM | POA: Diagnosis not present

## 2020-11-03 DIAGNOSIS — J441 Chronic obstructive pulmonary disease with (acute) exacerbation: Secondary | ICD-10-CM | POA: Diagnosis not present

## 2020-11-03 DIAGNOSIS — Z4781 Encounter for orthopedic aftercare following surgical amputation: Secondary | ICD-10-CM | POA: Diagnosis not present

## 2020-11-03 DIAGNOSIS — S91301D Unspecified open wound, right foot, subsequent encounter: Secondary | ICD-10-CM | POA: Diagnosis not present

## 2020-11-03 DIAGNOSIS — I5032 Chronic diastolic (congestive) heart failure: Secondary | ICD-10-CM | POA: Diagnosis not present

## 2020-11-03 DIAGNOSIS — I110 Hypertensive heart disease with heart failure: Secondary | ICD-10-CM | POA: Diagnosis not present

## 2020-11-03 DIAGNOSIS — I8310 Varicose veins of unspecified lower extremity with inflammation: Secondary | ICD-10-CM | POA: Diagnosis not present

## 2020-11-04 DIAGNOSIS — I5032 Chronic diastolic (congestive) heart failure: Secondary | ICD-10-CM | POA: Diagnosis not present

## 2020-11-04 DIAGNOSIS — J441 Chronic obstructive pulmonary disease with (acute) exacerbation: Secondary | ICD-10-CM | POA: Diagnosis not present

## 2020-11-04 DIAGNOSIS — Z4781 Encounter for orthopedic aftercare following surgical amputation: Secondary | ICD-10-CM | POA: Diagnosis not present

## 2020-11-04 DIAGNOSIS — I110 Hypertensive heart disease with heart failure: Secondary | ICD-10-CM | POA: Diagnosis not present

## 2020-11-04 DIAGNOSIS — T8189XA Other complications of procedures, not elsewhere classified, initial encounter: Secondary | ICD-10-CM | POA: Diagnosis not present

## 2020-11-04 DIAGNOSIS — S91301D Unspecified open wound, right foot, subsequent encounter: Secondary | ICD-10-CM | POA: Diagnosis not present

## 2020-11-04 DIAGNOSIS — I8310 Varicose veins of unspecified lower extremity with inflammation: Secondary | ICD-10-CM | POA: Diagnosis not present

## 2020-11-04 DIAGNOSIS — M6281 Muscle weakness (generalized): Secondary | ICD-10-CM | POA: Diagnosis not present

## 2020-11-11 DIAGNOSIS — I110 Hypertensive heart disease with heart failure: Secondary | ICD-10-CM | POA: Diagnosis not present

## 2020-11-11 DIAGNOSIS — M6281 Muscle weakness (generalized): Secondary | ICD-10-CM | POA: Diagnosis not present

## 2020-11-11 DIAGNOSIS — I5032 Chronic diastolic (congestive) heart failure: Secondary | ICD-10-CM | POA: Diagnosis not present

## 2020-11-11 DIAGNOSIS — I8310 Varicose veins of unspecified lower extremity with inflammation: Secondary | ICD-10-CM | POA: Diagnosis not present

## 2020-11-11 DIAGNOSIS — J441 Chronic obstructive pulmonary disease with (acute) exacerbation: Secondary | ICD-10-CM | POA: Diagnosis not present

## 2020-11-11 DIAGNOSIS — S91301D Unspecified open wound, right foot, subsequent encounter: Secondary | ICD-10-CM | POA: Diagnosis not present

## 2020-11-11 DIAGNOSIS — Z4781 Encounter for orthopedic aftercare following surgical amputation: Secondary | ICD-10-CM | POA: Diagnosis not present

## 2020-11-12 ENCOUNTER — Ambulatory Visit: Payer: Medicare Other | Admitting: Physician Assistant

## 2020-11-13 DIAGNOSIS — S91301D Unspecified open wound, right foot, subsequent encounter: Secondary | ICD-10-CM | POA: Diagnosis not present

## 2020-11-13 DIAGNOSIS — I5032 Chronic diastolic (congestive) heart failure: Secondary | ICD-10-CM | POA: Diagnosis not present

## 2020-11-13 DIAGNOSIS — I110 Hypertensive heart disease with heart failure: Secondary | ICD-10-CM | POA: Diagnosis not present

## 2020-11-13 DIAGNOSIS — M6281 Muscle weakness (generalized): Secondary | ICD-10-CM | POA: Diagnosis not present

## 2020-11-13 DIAGNOSIS — Z4781 Encounter for orthopedic aftercare following surgical amputation: Secondary | ICD-10-CM | POA: Diagnosis not present

## 2020-11-13 DIAGNOSIS — I8310 Varicose veins of unspecified lower extremity with inflammation: Secondary | ICD-10-CM | POA: Diagnosis not present

## 2020-11-13 DIAGNOSIS — J441 Chronic obstructive pulmonary disease with (acute) exacerbation: Secondary | ICD-10-CM | POA: Diagnosis not present

## 2020-11-17 DIAGNOSIS — J9611 Chronic respiratory failure with hypoxia: Secondary | ICD-10-CM | POA: Diagnosis not present

## 2020-11-17 DIAGNOSIS — J449 Chronic obstructive pulmonary disease, unspecified: Secondary | ICD-10-CM | POA: Diagnosis not present

## 2020-11-17 DIAGNOSIS — R0609 Other forms of dyspnea: Secondary | ICD-10-CM | POA: Diagnosis not present

## 2020-11-18 ENCOUNTER — Telehealth: Payer: Self-pay

## 2020-11-18 NOTE — Telephone Encounter (Signed)
Yolanda from [facility] called to report patient denied visit today as he is having diarrhea and not feeling well. Says patient is going to have his wife change the dressing. HH will follow up at the next visit.

## 2020-11-20 DIAGNOSIS — I8310 Varicose veins of unspecified lower extremity with inflammation: Secondary | ICD-10-CM | POA: Diagnosis not present

## 2020-11-20 DIAGNOSIS — I110 Hypertensive heart disease with heart failure: Secondary | ICD-10-CM | POA: Diagnosis not present

## 2020-11-20 DIAGNOSIS — S91301D Unspecified open wound, right foot, subsequent encounter: Secondary | ICD-10-CM | POA: Diagnosis not present

## 2020-11-20 DIAGNOSIS — I5032 Chronic diastolic (congestive) heart failure: Secondary | ICD-10-CM | POA: Diagnosis not present

## 2020-11-20 DIAGNOSIS — M6281 Muscle weakness (generalized): Secondary | ICD-10-CM | POA: Diagnosis not present

## 2020-11-20 DIAGNOSIS — J441 Chronic obstructive pulmonary disease with (acute) exacerbation: Secondary | ICD-10-CM | POA: Diagnosis not present

## 2020-11-20 DIAGNOSIS — Z4781 Encounter for orthopedic aftercare following surgical amputation: Secondary | ICD-10-CM | POA: Diagnosis not present

## 2020-11-21 ENCOUNTER — Telehealth: Payer: Self-pay

## 2020-11-21 DIAGNOSIS — I110 Hypertensive heart disease with heart failure: Secondary | ICD-10-CM | POA: Diagnosis not present

## 2020-11-21 DIAGNOSIS — J441 Chronic obstructive pulmonary disease with (acute) exacerbation: Secondary | ICD-10-CM | POA: Diagnosis not present

## 2020-11-21 DIAGNOSIS — S91301D Unspecified open wound, right foot, subsequent encounter: Secondary | ICD-10-CM | POA: Diagnosis not present

## 2020-11-21 DIAGNOSIS — I8310 Varicose veins of unspecified lower extremity with inflammation: Secondary | ICD-10-CM | POA: Diagnosis not present

## 2020-11-21 DIAGNOSIS — M6281 Muscle weakness (generalized): Secondary | ICD-10-CM | POA: Diagnosis not present

## 2020-11-21 DIAGNOSIS — I5032 Chronic diastolic (congestive) heart failure: Secondary | ICD-10-CM | POA: Diagnosis not present

## 2020-11-21 DIAGNOSIS — Z4781 Encounter for orthopedic aftercare following surgical amputation: Secondary | ICD-10-CM | POA: Diagnosis not present

## 2020-11-21 NOTE — Telephone Encounter (Signed)
Kathlee Nations from [facility] calls today. Patient's wound on his foot s/p ray amp on 08/28/20 has purulent malodorous drainage. His 4th toe is now very red and swollen. Says patient is weak and is having difficulty getting out of his chair. Denies fever or chills. Discussed with PA, advised patient goes to ED for further evaluation. Informed Kathlee Nations who says patient would like to wait another day - but will report to hospital tomorrow.

## 2020-11-23 ENCOUNTER — Other Ambulatory Visit: Payer: Self-pay

## 2020-11-23 ENCOUNTER — Emergency Department (HOSPITAL_COMMUNITY): Payer: Medicare Other

## 2020-11-23 ENCOUNTER — Encounter (HOSPITAL_COMMUNITY): Payer: Self-pay | Admitting: Emergency Medicine

## 2020-11-23 ENCOUNTER — Inpatient Hospital Stay (HOSPITAL_COMMUNITY)
Admission: EM | Admit: 2020-11-23 | Discharge: 2020-12-09 | DRG: 616 | Disposition: A | Discharge status: Skilled Nursing Facility | Payer: Medicare Other | Attending: Internal Medicine | Admitting: Internal Medicine

## 2020-11-23 DIAGNOSIS — E1151 Type 2 diabetes mellitus with diabetic peripheral angiopathy without gangrene: Secondary | ICD-10-CM | POA: Diagnosis present

## 2020-11-23 DIAGNOSIS — Z8719 Personal history of other diseases of the digestive system: Secondary | ICD-10-CM | POA: Diagnosis not present

## 2020-11-23 DIAGNOSIS — L89313 Pressure ulcer of right buttock, stage 3: Secondary | ICD-10-CM | POA: Diagnosis not present

## 2020-11-23 DIAGNOSIS — Z7951 Long term (current) use of inhaled steroids: Secondary | ICD-10-CM

## 2020-11-23 DIAGNOSIS — R2681 Unsteadiness on feet: Secondary | ICD-10-CM | POA: Diagnosis not present

## 2020-11-23 DIAGNOSIS — J449 Chronic obstructive pulmonary disease, unspecified: Secondary | ICD-10-CM | POA: Diagnosis not present

## 2020-11-23 DIAGNOSIS — R7881 Bacteremia: Secondary | ICD-10-CM | POA: Diagnosis not present

## 2020-11-23 DIAGNOSIS — G621 Alcoholic polyneuropathy: Secondary | ICD-10-CM | POA: Diagnosis present

## 2020-11-23 DIAGNOSIS — Z743 Need for continuous supervision: Secondary | ICD-10-CM | POA: Diagnosis not present

## 2020-11-23 DIAGNOSIS — Z79899 Other long term (current) drug therapy: Secondary | ICD-10-CM

## 2020-11-23 DIAGNOSIS — I5032 Chronic diastolic (congestive) heart failure: Secondary | ICD-10-CM | POA: Diagnosis not present

## 2020-11-23 DIAGNOSIS — J189 Pneumonia, unspecified organism: Secondary | ICD-10-CM

## 2020-11-23 DIAGNOSIS — R911 Solitary pulmonary nodule: Secondary | ICD-10-CM | POA: Diagnosis not present

## 2020-11-23 DIAGNOSIS — M7731 Calcaneal spur, right foot: Secondary | ICD-10-CM | POA: Diagnosis not present

## 2020-11-23 DIAGNOSIS — J90 Pleural effusion, not elsewhere classified: Secondary | ICD-10-CM | POA: Diagnosis not present

## 2020-11-23 DIAGNOSIS — L97919 Non-pressure chronic ulcer of unspecified part of right lower leg with unspecified severity: Secondary | ICD-10-CM | POA: Diagnosis not present

## 2020-11-23 DIAGNOSIS — I872 Venous insufficiency (chronic) (peripheral): Secondary | ICD-10-CM | POA: Diagnosis present

## 2020-11-23 DIAGNOSIS — Z8249 Family history of ischemic heart disease and other diseases of the circulatory system: Secondary | ICD-10-CM

## 2020-11-23 DIAGNOSIS — M868X7 Other osteomyelitis, ankle and foot: Secondary | ICD-10-CM | POA: Diagnosis not present

## 2020-11-23 DIAGNOSIS — M869 Osteomyelitis, unspecified: Secondary | ICD-10-CM | POA: Diagnosis present

## 2020-11-23 DIAGNOSIS — I110 Hypertensive heart disease with heart failure: Secondary | ICD-10-CM | POA: Diagnosis not present

## 2020-11-23 DIAGNOSIS — R531 Weakness: Secondary | ICD-10-CM | POA: Diagnosis not present

## 2020-11-23 DIAGNOSIS — R000 Tachycardia, unspecified: Secondary | ICD-10-CM | POA: Diagnosis not present

## 2020-11-23 DIAGNOSIS — M6281 Muscle weakness (generalized): Secondary | ICD-10-CM | POA: Diagnosis not present

## 2020-11-23 DIAGNOSIS — G8929 Other chronic pain: Secondary | ICD-10-CM | POA: Diagnosis not present

## 2020-11-23 DIAGNOSIS — Z7401 Bed confinement status: Secondary | ICD-10-CM | POA: Diagnosis not present

## 2020-11-23 DIAGNOSIS — B9561 Methicillin susceptible Staphylococcus aureus infection as the cause of diseases classified elsewhere: Secondary | ICD-10-CM | POA: Diagnosis not present

## 2020-11-23 DIAGNOSIS — I739 Peripheral vascular disease, unspecified: Secondary | ICD-10-CM | POA: Diagnosis present

## 2020-11-23 DIAGNOSIS — L03115 Cellulitis of right lower limb: Secondary | ICD-10-CM | POA: Diagnosis not present

## 2020-11-23 DIAGNOSIS — R2689 Other abnormalities of gait and mobility: Secondary | ICD-10-CM | POA: Diagnosis not present

## 2020-11-23 DIAGNOSIS — Z7952 Long term (current) use of systemic steroids: Secondary | ICD-10-CM | POA: Diagnosis not present

## 2020-11-23 DIAGNOSIS — Z808 Family history of malignant neoplasm of other organs or systems: Secondary | ICD-10-CM

## 2020-11-23 DIAGNOSIS — J440 Chronic obstructive pulmonary disease with acute lower respiratory infection: Secondary | ICD-10-CM | POA: Diagnosis present

## 2020-11-23 DIAGNOSIS — M86671 Other chronic osteomyelitis, right ankle and foot: Secondary | ICD-10-CM | POA: Diagnosis not present

## 2020-11-23 DIAGNOSIS — I351 Nonrheumatic aortic (valve) insufficiency: Secondary | ICD-10-CM | POA: Diagnosis not present

## 2020-11-23 DIAGNOSIS — E871 Hypo-osmolality and hyponatremia: Secondary | ICD-10-CM | POA: Diagnosis not present

## 2020-11-23 DIAGNOSIS — Z6832 Body mass index (BMI) 32.0-32.9, adult: Secondary | ICD-10-CM | POA: Diagnosis not present

## 2020-11-23 DIAGNOSIS — E669 Obesity, unspecified: Secondary | ICD-10-CM | POA: Diagnosis present

## 2020-11-23 DIAGNOSIS — R918 Other nonspecific abnormal finding of lung field: Secondary | ICD-10-CM | POA: Diagnosis present

## 2020-11-23 DIAGNOSIS — Z9981 Dependence on supplemental oxygen: Secondary | ICD-10-CM

## 2020-11-23 DIAGNOSIS — M86172 Other acute osteomyelitis, left ankle and foot: Secondary | ICD-10-CM | POA: Diagnosis not present

## 2020-11-23 DIAGNOSIS — L089 Local infection of the skin and subcutaneous tissue, unspecified: Secondary | ICD-10-CM

## 2020-11-23 DIAGNOSIS — N400 Enlarged prostate without lower urinary tract symptoms: Secondary | ICD-10-CM | POA: Diagnosis present

## 2020-11-23 DIAGNOSIS — D72829 Elevated white blood cell count, unspecified: Secondary | ICD-10-CM | POA: Diagnosis not present

## 2020-11-23 DIAGNOSIS — I517 Cardiomegaly: Secondary | ICD-10-CM | POA: Diagnosis not present

## 2020-11-23 DIAGNOSIS — I70261 Atherosclerosis of native arteries of extremities with gangrene, right leg: Secondary | ICD-10-CM | POA: Diagnosis not present

## 2020-11-23 DIAGNOSIS — Z87891 Personal history of nicotine dependence: Secondary | ICD-10-CM | POA: Diagnosis not present

## 2020-11-23 DIAGNOSIS — Z8616 Personal history of COVID-19: Secondary | ICD-10-CM | POA: Diagnosis not present

## 2020-11-23 DIAGNOSIS — I10 Essential (primary) hypertension: Secondary | ICD-10-CM | POA: Diagnosis not present

## 2020-11-23 DIAGNOSIS — E876 Hypokalemia: Secondary | ICD-10-CM | POA: Diagnosis not present

## 2020-11-23 DIAGNOSIS — Z7982 Long term (current) use of aspirin: Secondary | ICD-10-CM | POA: Diagnosis not present

## 2020-11-23 DIAGNOSIS — I70238 Atherosclerosis of native arteries of right leg with ulceration of other part of lower right leg: Secondary | ICD-10-CM | POA: Diagnosis not present

## 2020-11-23 DIAGNOSIS — J9611 Chronic respiratory failure with hypoxia: Secondary | ICD-10-CM | POA: Diagnosis not present

## 2020-11-23 DIAGNOSIS — M898X6 Other specified disorders of bone, lower leg: Secondary | ICD-10-CM | POA: Diagnosis not present

## 2020-11-23 DIAGNOSIS — I447 Left bundle-branch block, unspecified: Secondary | ICD-10-CM | POA: Diagnosis present

## 2020-11-23 DIAGNOSIS — Z89511 Acquired absence of right leg below knee: Secondary | ICD-10-CM | POA: Diagnosis not present

## 2020-11-23 DIAGNOSIS — Z89431 Acquired absence of right foot: Secondary | ICD-10-CM | POA: Diagnosis not present

## 2020-11-23 DIAGNOSIS — M7989 Other specified soft tissue disorders: Secondary | ICD-10-CM | POA: Diagnosis not present

## 2020-11-23 DIAGNOSIS — E1169 Type 2 diabetes mellitus with other specified complication: Secondary | ICD-10-CM | POA: Diagnosis not present

## 2020-11-23 LAB — CBC WITH DIFFERENTIAL/PLATELET
Abs Immature Granulocytes: 1.32 10*3/uL — ABNORMAL HIGH (ref 0.00–0.07)
Basophils Absolute: 0.2 10*3/uL — ABNORMAL HIGH (ref 0.0–0.1)
Basophils Relative: 1 %
Eosinophils Absolute: 0.1 10*3/uL (ref 0.0–0.5)
Eosinophils Relative: 0 %
HCT: 37.4 % — ABNORMAL LOW (ref 39.0–52.0)
Hemoglobin: 11.9 g/dL — ABNORMAL LOW (ref 13.0–17.0)
Immature Granulocytes: 5 %
Lymphocytes Relative: 11 %
Lymphs Abs: 2.7 10*3/uL (ref 0.7–4.0)
MCH: 27.3 pg (ref 26.0–34.0)
MCHC: 31.8 g/dL (ref 30.0–36.0)
MCV: 85.8 fL (ref 80.0–100.0)
Monocytes Absolute: 1.5 10*3/uL — ABNORMAL HIGH (ref 0.1–1.0)
Monocytes Relative: 6 %
Neutro Abs: 19.2 10*3/uL — ABNORMAL HIGH (ref 1.7–7.7)
Neutrophils Relative %: 77 %
Platelets: 570 10*3/uL — ABNORMAL HIGH (ref 150–400)
RBC: 4.36 MIL/uL (ref 4.22–5.81)
RDW: 16.5 % — ABNORMAL HIGH (ref 11.5–15.5)
WBC: 25 10*3/uL — ABNORMAL HIGH (ref 4.0–10.5)
nRBC: 0.2 % (ref 0.0–0.2)

## 2020-11-23 LAB — HEMOGLOBIN A1C
Hgb A1c MFr Bld: 6.5 % — ABNORMAL HIGH (ref 4.8–5.6)
Mean Plasma Glucose: 139.85 mg/dL

## 2020-11-23 LAB — COMPREHENSIVE METABOLIC PANEL
ALT: 26 U/L (ref 0–44)
AST: 35 U/L (ref 15–41)
Albumin: 2.5 g/dL — ABNORMAL LOW (ref 3.5–5.0)
Alkaline Phosphatase: 70 U/L (ref 38–126)
Anion gap: 11 (ref 5–15)
BUN: 34 mg/dL — ABNORMAL HIGH (ref 8–23)
CO2: 27 mmol/L (ref 22–32)
Calcium: 8.5 mg/dL — ABNORMAL LOW (ref 8.9–10.3)
Chloride: 94 mmol/L — ABNORMAL LOW (ref 98–111)
Creatinine, Ser: 1.17 mg/dL (ref 0.61–1.24)
GFR, Estimated: >60 mL/min (ref >60)
Glucose, Bld: 112 mg/dL — ABNORMAL HIGH (ref 70–99)
Potassium: 3.6 mmol/L (ref 3.5–5.1)
Sodium: 132 mmol/L — ABNORMAL LOW (ref 135–145)
Total Bilirubin: 1.1 mg/dL (ref 0.3–1.2)
Total Protein: 6 g/dL — ABNORMAL LOW (ref 6.5–8.1)

## 2020-11-23 LAB — URINALYSIS, ROUTINE W REFLEX MICROSCOPIC
Bilirubin Urine: NEGATIVE
Glucose, UA: NEGATIVE mg/dL
Ketones, ur: NEGATIVE mg/dL
Leukocytes,Ua: NEGATIVE
Nitrite: NEGATIVE
Protein, ur: 30 mg/dL — AB
Specific Gravity, Urine: 1.018 (ref 1.005–1.030)
pH: 5 (ref 5.0–8.0)

## 2020-11-23 LAB — PREALBUMIN: Prealbumin: 9.8 mg/dL — ABNORMAL LOW (ref 18–38)

## 2020-11-23 LAB — RESP PANEL BY RT-PCR (FLU A&B, COVID) ARPGX2
Influenza A by PCR: NEGATIVE
Influenza B by PCR: NEGATIVE
SARS Coronavirus 2 by RT PCR: POSITIVE — AB

## 2020-11-23 LAB — APTT: aPTT: 29 seconds (ref 24–36)

## 2020-11-23 LAB — SEDIMENTATION RATE: Sed Rate: 23 mm/hr — ABNORMAL HIGH (ref 0–16)

## 2020-11-23 LAB — C-REACTIVE PROTEIN: CRP: 27.1 mg/dL — ABNORMAL HIGH (ref <1.0)

## 2020-11-23 LAB — GLUCOSE, CAPILLARY: Glucose-Capillary: 89 mg/dL (ref 70–99)

## 2020-11-23 LAB — PROTIME-INR
INR: 1 (ref 0.8–1.2)
Prothrombin Time: 13.2 seconds (ref 11.4–15.2)

## 2020-11-23 LAB — LACTIC ACID, PLASMA
Lactic Acid, Venous: 2.8 mmol/L (ref 0.5–1.9)
Lactic Acid, Venous: 2.2 mmol/L (ref 0.5–1.9)

## 2020-11-23 MED ORDER — SODIUM CHLORIDE 0.9 % IV SOLN
2.0000 g | Freq: Three times a day (TID) | INTRAVENOUS | Status: DC
  Administered 2020-11-23 – 2020-11-24 (×3): 2 g via INTRAVENOUS
  Filled 2020-11-23 (×3): qty 2

## 2020-11-23 MED ORDER — GUAIFENESIN-DM 100-10 MG/5ML PO SYRP: 10.0000 mL | ORAL_SOLUTION | ORAL | Status: DC | PRN

## 2020-11-23 MED ORDER — ENOXAPARIN SODIUM 40 MG/0.4ML IJ SOSY
40.0000 mg | PREFILLED_SYRINGE | INTRAMUSCULAR | Status: DC
  Administered 2020-11-23 – 2020-11-28 (×6): 40 mg via SUBCUTANEOUS
  Filled 2020-11-23 (×6): qty 0.4

## 2020-11-23 MED ORDER — SODIUM CHLORIDE 0.9 % IV SOLN: 100.0000 mg | Freq: Every day | INTRAVENOUS | Status: DC

## 2020-11-23 MED ORDER — BISACODYL 10 MG RE SUPP: 10.0000 mg | Freq: Every day | RECTAL | Status: DC | PRN

## 2020-11-23 MED ORDER — MAGNESIUM CITRATE PO SOLN: 1.0000 | Freq: Once | ORAL | Status: DC | PRN

## 2020-11-23 MED ORDER — THIAMINE HCL 100 MG PO TABS
100.0000 mg | ORAL_TABLET | Freq: Every day | ORAL | Status: DC
  Administered 2020-11-24 – 2020-12-09 (×15): 100 mg via ORAL
  Filled 2020-11-23 (×16): qty 1

## 2020-11-23 MED ORDER — ACETAMINOPHEN 325 MG PO TABS
650.0000 mg | ORAL_TABLET | Freq: Four times a day (QID) | ORAL | Status: DC | PRN
  Administered 2020-11-25 – 2020-11-29 (×5): 650 mg via ORAL
  Filled 2020-11-23 (×5): qty 2

## 2020-11-23 MED ORDER — ALBUTEROL SULFATE HFA 108 (90 BASE) MCG/ACT IN AERS
2.0000 | INHALATION_SPRAY | Freq: Four times a day (QID) | RESPIRATORY_TRACT | Status: DC
  Filled 2020-11-23: qty 6.7

## 2020-11-23 MED ORDER — INSULIN ASPART 100 UNIT/ML IJ SOLN: 0.0000 [IU] | Freq: Every day | INTRAMUSCULAR | Status: DC

## 2020-11-23 MED ORDER — SODIUM CHLORIDE 0.9% FLUSH
3.0000 mL | Freq: Two times a day (BID) | INTRAVENOUS | Status: DC
  Administered 2020-11-24 – 2020-11-30 (×7): 3 mL via INTRAVENOUS

## 2020-11-23 MED ORDER — ACETAMINOPHEN 650 MG RE SUPP: 650.0000 mg | Freq: Four times a day (QID) | RECTAL | Status: DC | PRN

## 2020-11-23 MED ORDER — POLYETHYLENE GLYCOL 3350 17 G PO PACK: 17.0000 g | PACK | Freq: Every day | ORAL | Status: DC | PRN

## 2020-11-23 MED ORDER — SODIUM CHLORIDE 0.9 % IV SOLN: INTRAVENOUS | Status: DC

## 2020-11-23 MED ORDER — ZINC SULFATE 220 (50 ZN) MG PO CAPS
220.0000 mg | ORAL_CAPSULE | Freq: Every day | ORAL | Status: DC
  Administered 2020-11-24 – 2020-12-09 (×15): 220 mg via ORAL
  Filled 2020-11-23 (×16): qty 1

## 2020-11-23 MED ORDER — ADULT MULTIVITAMIN W/MINERALS CH
1.0000 | ORAL_TABLET | Freq: Every day | ORAL | Status: DC
  Administered 2020-11-24 – 2020-11-25 (×2): 1 via ORAL
  Filled 2020-11-23 (×2): qty 1

## 2020-11-23 MED ORDER — SODIUM CHLORIDE 0.9 % IV SOLN
2.0000 g | Freq: Once | INTRAVENOUS | Status: AC
  Administered 2020-11-23: 2 g via INTRAVENOUS
  Filled 2020-11-23: qty 2

## 2020-11-23 MED ORDER — PREDNISONE 20 MG PO TABS
20.0000 mg | ORAL_TABLET | Freq: Every day | ORAL | Status: DC
  Administered 2020-11-24 – 2020-12-09 (×16): 20 mg via ORAL
  Filled 2020-11-23 (×16): qty 1

## 2020-11-23 MED ORDER — INSULIN ASPART 100 UNIT/ML IJ SOLN
0.0000 [IU] | Freq: Three times a day (TID) | INTRAMUSCULAR | Status: DC
  Administered 2020-11-24: 3 [IU] via SUBCUTANEOUS
  Administered 2020-11-25 – 2020-11-26 (×2): 2 [IU] via SUBCUTANEOUS

## 2020-11-23 MED ORDER — VITAMIN D 25 MCG (1000 UNIT) PO TABS
1000.0000 [IU] | ORAL_TABLET | Freq: Every day | ORAL | Status: DC
  Administered 2020-11-24 – 2020-12-09 (×15): 1000 [IU] via ORAL
  Filled 2020-11-23 (×16): qty 1

## 2020-11-23 MED ORDER — SODIUM CHLORIDE 0.9 % IV SOLN
200.0000 mg | Freq: Once | INTRAVENOUS | Status: AC
  Administered 2020-11-24: 200 mg via INTRAVENOUS
  Filled 2020-11-23: qty 40

## 2020-11-23 MED ORDER — SODIUM CHLORIDE 0.9% FLUSH: 3.0000 mL | INTRAVENOUS | Status: DC | PRN

## 2020-11-23 MED ORDER — SODIUM CHLORIDE 0.9 % IV BOLUS
500.0000 mL | Freq: Once | INTRAVENOUS | Status: AC
  Administered 2020-11-23: 500 mL via INTRAVENOUS

## 2020-11-23 MED ORDER — SODIUM CHLORIDE 0.9 % IV SOLN
250.0000 mL | INTRAVENOUS | Status: DC | PRN
  Administered 2020-11-24 – 2020-11-25 (×2): 250 mL via INTRAVENOUS

## 2020-11-23 MED ORDER — VANCOMYCIN HCL 1250 MG/250ML IV SOLN
1250.0000 mg | INTRAVENOUS | Status: DC
  Administered 2020-11-24: 1250 mg via INTRAVENOUS
  Filled 2020-11-23: qty 250

## 2020-11-23 MED ORDER — VANCOMYCIN HCL 2000 MG/400ML IV SOLN
2000.0000 mg | Freq: Once | INTRAVENOUS | Status: AC
  Administered 2020-11-23: 2000 mg via INTRAVENOUS
  Filled 2020-11-23: qty 400

## 2020-11-23 MED ORDER — FOLIC ACID 1 MG PO TABS
1.0000 mg | ORAL_TABLET | Freq: Every day | ORAL | Status: DC
  Administered 2020-11-24 – 2020-12-09 (×15): 1 mg via ORAL
  Filled 2020-11-23 (×16): qty 1

## 2020-11-23 MED ORDER — ASCORBIC ACID 500 MG PO TABS
500.0000 mg | ORAL_TABLET | Freq: Every day | ORAL | Status: DC
  Administered 2020-11-24 – 2020-12-09 (×15): 500 mg via ORAL
  Filled 2020-11-23 (×16): qty 1

## 2020-11-23 NOTE — ED Triage Notes (Signed)
Pt reports generalized weakness x 2 weeks.  Wife states urine brown x 3-4 days.  Wife reports pt had COVID 2 months ago and R 5th toe amputation in April.  Denies fever and chills.  C/o infection to R foot s/p toe amputation.

## 2020-11-23 NOTE — ED Provider Notes (Addendum)
[facility] EMERGENCY DEPARTMENT Provider Note   CSN: [id number] Arrival date & time: 11/23/20  1232     History Chief Complaint  Patient presents with   Weakness    Joel Perez is a 75 y.o. male.  75 year old male with prior medical history as detailed below presents for evaluation.  Patient reports generalized weakness x 1 to 2 weeks.  Patient reports increasing drainage from wound to the right foot.  He is status post right fifth toe amputation in April of this past spring.  He reports that he is currently not on antibiotics.  He was treated for osteomyelitis of the foot after amputation.  He denies fever.  He denies chills.  He does report mildly increased shortness of breath.  He is on 4 L nasal cannula O2 at home chronically.  The history is provided by the patient, the spouse and medical records.  Weakness Severity:  Moderate Onset quality:  Gradual Duration:  2 weeks Timing:  Constant Progression:  Worsening Chronicity:  New Relieved by:  Nothing Worsened by:  Nothing Associated symptoms: shortness of breath       Past Medical History:  Diagnosis Date   Blind right eye 1985   Following a work accident   BPH (benign prostatic hyperplasia)    Capsular cataract of left eye    COPD (chronic obstructive pulmonary disease) ([place])    Dr. [name]   Current smoker    Long-term smoker. Not interested in quitting   Diverticulosis    With intermittent diverticulitis   Erectile dysfunction    Essential hypertension    Several recorded blood pressures greater than 140/90.   H/O mitral valve prolapse    By report, but not confirmed by echo.   Low back pain    Chronic. Followed by Dr. Suella Broad   Mild aortic regurgitation 12/2013   Mild to moderate aortic regurgitation on echo   Neuropathy     Patient Active Problem List   Diagnosis Date Noted   Antibiotic-associated diarrhea 09/16/2020   PICC (peripherally inserted central catheter) in place  09/16/2020   Osteomyelitis ([place]) 08/25/2020   Multiple pulmonary nodules determined by computed tomography of lung 08/14/2020   Community acquired pneumonia of right lower lobe of lung    Pressure injury of skin 07/23/2020   Sepsis ([place]) 07/22/2020   Edema of both legs 12/19/2018   PVD (peripheral vascular disease) ([place]) 12/19/2018   DOE (dyspnea on exertion) 11/14/2018   Chronic respiratory failure with hypoxia ([place]) 04/06/2018   Decreased pedal pulses 01/17/2017   Mild aortic valve regurgitation 01/17/2017   Leg swelling 12/30/2016   Neuropathy, alcoholic ([place]) 58/52/7782   COPD III with marked reversibility 12/27/2012    Past Surgical History:  Procedure Laterality Date   ABDOMINAL AORTOGRAM W/LOWER EXTREMITY N/A 08/27/2020   Procedure: ABDOMINAL AORTOGRAM W/LOWER EXTREMITY;  Surgeon: Cherre Robins, MD;  Location: [place] [facility];  Service: Cardiovascular;  Laterality: N/A;   AMPUTATION Right 08/28/2020   Procedure: AMPUTATION RAY FIFTH TOE;  Surgeon: Cherre Robins, MD;  Location: [facility] OR;  Service: Vascular;  Laterality: Right;   APPLICATION OF WOUND VAC Right 08/28/2020   Procedure: APPLICATION OF WOUND VAC, right foot;  Surgeon: Cherre Robins, MD;  Location: [place];  Service: Vascular;  Laterality: Right;   COLON SURGERY  2000   COLONOSCOPY WITH PROPOFOL N/A 03/18/2020   Procedure: COLONOSCOPY WITH PROPOFOL;  Surgeon: Otis Brace, MD;  Location: [facility];  Service: Gastroenterology;  Laterality: N/A;   ELBOW SURGERY     POLYPECTOMY  03/18/2020   Procedure: POLYPECTOMY;  Surgeon: Otis Brace, MD;  Location: [facility];  Service: Gastroenterology;;   TRANSTHORACIC ECHOCARDIOGRAM  12/29/2016   EF 60-65%. Moderate LVH. Mild aortic valve calcification. Mild to moderate regurgitation with no stenosis. Mitral valve annular calcification with no comment of prolapse or regurgitation.       Family History  Problem Relation Age of Onset   Heart disease Father     Breast cancer Mother    Other Sister    Brain cancer Brother     Social History   Tobacco Use   Smoking status: Former    Packs/day: 2.00    Years: 50.00    Pack years: 100.00    Types: Cigarettes    Quit date: 03/16/2018    Years since quitting: 2.6   Smokeless tobacco: Never  Vaping Use   Vaping Use: Some days   Start date: 03/16/2018  Substance Use Topics   Alcohol use: Yes    Alcohol/week: 0.0 standard drinks   Drug use: No    Home Medications Prior to Admission medications   Medication Sig Start Date End Date Taking? Authorizing Provider  albuterol (PROVENTIL) (2.5 MG/3ML) 0.083% nebulizer solution USE 1 VIAL IN NEBULIZER EVERY 6 HOURS AS NEEDED FOR WHEEZING AND FOR SHORTNESS OF BREATH Patient taking differently: Take 2.5 mg by nebulization every 6 (six) hours as needed for wheezing or shortness of breath. 01/14/20   Tanda Rockers, MD  aspirin EC 81 MG tablet Take 81 mg by mouth daily. Swallow whole.    [provider]  atorvastatin (LIPITOR) 40 MG tablet TAKE 1 TABLET BY MOUTH ONCE DAILY . APPOINTMENT REQUIRED FOR FUTURE REFILLS Patient taking differently: Take 40 mg by mouth daily. 07/15/20   Wellington Hampshire, MD  Budeson-Glycopyrrol-Formoterol (BREZTRI AEROSPHERE) 160-9-4.8 MCG/ACT AERO Take 2 puffs first thing in am and then another 2 puffs about 12 hours later. 08/13/20   Tanda Rockers, MD  cyclobenzaprine (FLEXERIL) 10 MG tablet Take 10 mg by mouth at bedtime.    [provider]  furosemide (LASIX) 20 MG tablet TAKE 1 TABLET BY MOUTH ONCE DAILY 09/15/20   Wellington Hampshire, MD  furosemide (LASIX) 20 MG tablet Take 1 tablet (20 mg total) by mouth daily. 10/06/20 11/05/20  Lorretta Harp, MD  gabapentin (NEURONTIN) 600 MG tablet Take 600 mg by mouth every 8 (eight) hours. 02/25/20   [provider]  loperamide (IMODIUM) 2 MG capsule Take 1 capsule (2 mg total) by mouth as needed for diarrhea or loose stools. 09/03/20   Debbe Odea, MD   losartan (COZAAR) 50 MG tablet Take 1 tablet (50 mg total) by mouth daily. 01/08/20   Wellington Hampshire, MD  Multiple Vitamin (MULTIVITAMIN WITH MINERALS) TABS tablet Take 1 tablet by mouth daily.    [provider]  oxyCODONE-acetaminophen (PERCOCET) 10-325 MG tablet Take 1 tablet by mouth 2 (two) times daily as needed (back pain.).     [provider]  predniSONE (DELTASONE) 10 MG tablet 2 until better then one daily Patient taking differently: See admin instructions. 2 until better then one daily 08/13/20   Tanda Rockers, MD  PROAIR HFA 108 [phone] Base) MCG/ACT inhaler INHALE 2 PUFFS BY MOUTH EVERY 6 HOURS AS NEEDED FOR WHEEZING FOR SHORTNESS OF BREATH Patient taking differently: Inhale 2 puffs into the lungs every 6 (six) hours as needed for wheezing or shortness of breath.  07/07/20   Tanda Rockers, MD  saccharomyces boulardii (FLORASTOR) 250 MG capsule Take 1 capsule (250 mg total) by mouth 2 (two) times daily. 09/03/20   Debbe Odea, MD  sildenafil (REVATIO) 20 MG tablet Take 20 mg by mouth as needed.    [provider]  sodium chloride (OCEAN) 0.65 % nasal spray 2 sprays in each nostril as needed    [provider]  vitamin B-12 (CYANOCOBALAMIN) 1000 MCG tablet Take 1,000 mcg by mouth daily.    [provider]    Allergies    Other, Codeine, and Sulfa antibiotics  Review of Systems   Review of Systems  Respiratory:  Positive for shortness of breath.   Neurological:  Positive for weakness.  All other systems reviewed and are negative.  Physical Exam Updated Vital Signs BP (!) 128/115   Pulse 98   Temp 98.8 F (37.1 C)   Resp 17   SpO2 (!) 68%   Physical Exam Vitals and nursing note reviewed.  Constitutional:      General: He is not in acute distress.    Appearance: Normal appearance. He is well-developed.  HENT:     Head: Normocephalic and atraumatic.  Eyes:     Conjunctiva/sclera: Conjunctivae normal.     Pupils: Pupils are  equal, round, and reactive to light.  Cardiovascular:     Rate and Rhythm: Normal rate and regular rhythm.     Heart sounds: Normal heart sounds.  Pulmonary:     Effort: Pulmonary effort is normal. No respiratory distress.     Comments: decreased breath sounds at bilateral bases Abdominal:     General: There is no distension.     Palpations: Abdomen is soft.     Tenderness: There is no abdominal tenderness.  Musculoskeletal:        General: No deformity. Normal range of motion.     Cervical back: Normal range of motion and neck supple.     Comments: Right foot status post right fifth toe amputation.  Draining and malodorous smell noted at wound site.  Bilateral lower extremities with 2-3+ edema to the shins.  Chronic venous stasis noted with weeping areas.  Skin:    General: Skin is warm and dry.  Neurological:     General: No focal deficit present.     Mental Status: He is alert and oriented to person, place, and time.       ED Results / Procedures / Treatments   Labs (all labs ordered are listed, but only abnormal results are displayed) Labs Reviewed  LACTIC ACID, PLASMA - Abnormal; Notable for the following components:      Result Value   Lactic Acid, Venous 2.8 (*)    All other components within normal limits  COMPREHENSIVE METABOLIC PANEL - Abnormal; Notable for the following components:   Sodium 132 (*)    Chloride 94 (*)    Glucose, Bld 112 (*)    BUN 34 (*)    Calcium 8.5 (*)    Total Protein 6.0 (*)    Albumin 2.5 (*)    All other components within normal limits  CBC WITH DIFFERENTIAL/PLATELET - Abnormal; Notable for the following components:   WBC 25.0 (*)    Hemoglobin 11.9 (*)    HCT 37.4 (*)    RDW 16.5 (*)    Platelets 570 (*)    Neutro Abs 19.2 (*)    Monocytes Absolute 1.5 (*)    Basophils Absolute 0.2 (*)  Abs Immature Granulocytes 1.32 (*)    All other components within normal limits  CULTURE, BLOOD (SINGLE)  URINE CULTURE  CULTURE, BLOOD  (ROUTINE X 2)  CULTURE, BLOOD (ROUTINE X 2)  PROTIME-INR  APTT  LACTIC ACID, PLASMA  URINALYSIS, ROUTINE W REFLEX MICROSCOPIC    EKG EKG Interpretation  Date/Time:  Sunday November 23 2020 14:06:52 EDT Ventricular Rate:  108 PR Interval:  240 QRS Duration: 140 QT Interval:  394 QTC Calculation: 527 R Axis:   55 Text Interpretation: Sinus tachycardia with 1st degree A-V block with Fusion complexes Left bundle branch block Abnormal ECG Confirmed by Dene Gentry [phone]) on 11/23/2020 3:15:37 PM  Radiology DG Chest 1 View  Result Date: 11/23/2020 CLINICAL DATA:  Concern for osteomyelitis of the RIGHT foot. Recent partial RIGHT foot amputation. Weakness. EXAM: CHEST  1 VIEW COMPARISON:  07/22/2020 FINDINGS: Stable cardiomegaly. Patchy opacity is identified in the RIGHT lung base, consistent with infectious infiltrate. There is no pulmonary edema. Within the RIGHT UPPER lobe, a 1.5 centimeter spiculated mass and adjacent satellite nodule are again noted. IMPRESSION: 1. RIGHT LOWER lobe infiltrates. 2. RIGHT UPPER lobe masses, as seen on prior exams. Electronically Signed   By: Nolon Nations M.D.   On: 11/23/2020 14:35   DG Foot Complete Right  Result Date: 11/23/2020 CLINICAL DATA:  Recent partial amputation, concern for osteomyelitis. EXAM: RIGHT FOOT COMPLETE - 3+ VIEW COMPARISON:  08/25/2020 FINDINGS: Prior amputation of the small toe. Recognizable 3.1 cm proximal portion of the fifth metatarsal noted, lung distal margin of this there is a 3.8 cm region of amorphous hazy calcification suspicious for a destroyed bony fragment from osteomyelitis, less likely to be heterotopic ossification along the prior amputation. No appreciable destructive findings along the remaining metatarsals or phalanges. Mildly exaggerated longitudinal arch of the foot on the lateral projection which is not obtained with the patient standing. Extensive soft tissue swelling along the forefoot especially dorsally.  Irregularity of the cutaneous surface along the amputation site and lateral to the fifth metatarsal. Small Achilles calcaneal spur. IMPRESSION: 1. 3.8 cm region of amorphous hazy calcification corresponding to the shaft of the fifth metatarsal, probably bony destruction from active osteomyelitis and less likely to from heterotopic ossification along the site of prior amputation. There is a 3.1 cm fragment of proximal fifth metatarsal which appears recognizable, proximal to this hazy calcification. Soft tissue swelling and irregularity of the overlying soft tissues. Electronically Signed   By: Van Clines M.D.   On: 11/23/2020 15:10    Procedures Procedures   Medications Ordered in ED Medications  sodium chloride 0.9 % bolus 500 mL (has no administration in time range)  ceFEPIme (MAXIPIME) 2 g in sodium chloride 0.9 % 100 mL IVPB (has no administration in time range)    ED Course  I have reviewed the triage vital signs and the nursing notes.  Pertinent labs & imaging results that were available during my care of the patient were reviewed by me and considered in my medical decision making (see chart for details).    MDM Rules/Calculators/A&P                          MDM  MSE complete  AMADI FRADY was evaluated in Emergency Department on 11/23/2020 for the symptoms described in the history of present illness. He was evaluated in the context of the global COVID-19 pandemic, which necessitated consideration that the patient might be at risk for infection  with the SARS-CoV-2 virus that causes COVID-19. Institutional protocols and algorithms that pertain to the evaluation of patients at risk for COVID-19 are in a state of rapid change based on information released by regulatory bodies including the CDC and federal and state organizations. These policies and algorithms were followed during the patient's care in the ED.  Patient is presenting for evaluation of generalized worsening weakness.   Patient with also suspected recurrent infection in his right foot at site of prior amputation.  Patient with history of osteomyelitis in the right foot as well.  Patient's work-up is suggestive of recurrent infection in the right foot.  Patient also with possible pneumonia.  Cultures obtained in the ED.  Broad-spectrum antibiotics initiated in the ED.  Patient would benefit from admission.  Hospitalist service is aware of case and will evaluate for same.  Final Clinical Impression(s) / ED Diagnoses Final diagnoses:  Weakness  Leukocytosis, unspecified type  Foot infection    Rx / DC Orders ED Discharge Orders     None        Valarie Merino, MD 11/23/20 1553    Valarie Merino, MD 11/23/20 1558

## 2020-11-23 NOTE — Progress Notes (Signed)
NEW ADMISSION NOTE New Admission Note:   Arrival Method: E.D. stretcher bed Mental Orientation: Alert to place ,time and situation,memory impairment. Telemetry:#16 Assessment: Completed Skin: Redness and  weeping area on the left leg.Unstageable wound on anterior right foot. MASD on buttock.Stage II on right buttocks IV:Lt FA-NSL ,Right a/c -normal saline. Pain:Denies Tubes:None Safety Measures: Safety Fall Prevention Plan has been given, discussed and signed Admission: Completed [facility] Orientation: Patient has been orientated to the room, unit and staff.  Family:Wife at bedside,made aware of visitation policy.  Orders have been reviewed and implemented. Will continue to monitor the patient. Call light has been placed within reach and bed alarm has been activated.   [place], Zenon Mayo, RN

## 2020-11-23 NOTE — Consult Note (Addendum)
Patient is a 75 year old male who previously underwent open fifth ray amputation by Dr. Stanford Breed August 28, 2020.  The wound had been improving but his home health nurse became concerned that it was increasing in drainage.  He presented to the emergency room today.  He also has COPD exacerbation and pneumonia.  He also possibly has lung cancer.  He was on IV antibiotics for several weeks after his toe amputation.  These have now been discontinued.  Presented to the ER today mainly complaining of weakness.  He has no pain in the foot.  He had COVID 2 months ago.  He is chronically on 4 L of oxygen.  He has been placed on cefepime and vancomycin by the hospitalist service.  He has previously been seen by Dr. Rachell Cipro thought not to be a candidate for biopsy or treatment of his potential lung cancer.  He has also developed worsening lower extremity edema and states that he forgot to take his diuretic the last few days.  Past Medical History:  Diagnosis Date   Blind right eye 1985   Following a work accident   BPH (benign prostatic hyperplasia)    Capsular cataract of left eye    COPD (chronic obstructive pulmonary disease) ([place])    Dr. [name]   Current smoker    Long-term smoker. Not interested in quitting   Diverticulosis    With intermittent diverticulitis   Erectile dysfunction    Essential hypertension    Several recorded blood pressures greater than 140/90.   H/O mitral valve prolapse    By report, but not confirmed by echo.   Low back pain    Chronic. Followed by Dr. Suella Broad   Mild aortic regurgitation 12/2013   Mild to moderate aortic regurgitation on echo   Neuropathy     Past Surgical History:  Procedure Laterality Date   ABDOMINAL AORTOGRAM W/LOWER EXTREMITY N/A 08/27/2020   Procedure: ABDOMINAL AORTOGRAM W/LOWER EXTREMITY;  Surgeon: Cherre Robins, MD;  Location: [place] [facility];  Service: Cardiovascular;  Laterality: N/A;   AMPUTATION Right 08/28/2020   Procedure: AMPUTATION  RAY FIFTH TOE;  Surgeon: Cherre Robins, MD;  Location: [facility] OR;  Service: Vascular;  Laterality: Right;   APPLICATION OF WOUND VAC Right 08/28/2020   Procedure: APPLICATION OF WOUND VAC, right foot;  Surgeon: Cherre Robins, MD;  Location: [place];  Service: Vascular;  Laterality: Right;   COLON SURGERY  2000   COLONOSCOPY WITH PROPOFOL N/A 03/18/2020   Procedure: COLONOSCOPY WITH PROPOFOL;  Surgeon: Otis Brace, MD;  Location: [facility];  Service: Gastroenterology;  Laterality: N/A;   ELBOW SURGERY     POLYPECTOMY  03/18/2020   Procedure: POLYPECTOMY;  Surgeon: Otis Brace, MD;  Location: [facility];  Service: Gastroenterology;;   TRANSTHORACIC ECHOCARDIOGRAM  12/29/2016   EF 60-65%. Moderate LVH. Mild aortic valve calcification. Mild to moderate regurgitation with no stenosis. Mitral valve annular calcification with no comment of prolapse or regurgitation.   No current facility-administered medications on file prior to encounter.   Current Outpatient Medications on File Prior to Encounter  Medication Sig Dispense Refill   albuterol (PROVENTIL) (2.5 MG/3ML) 0.083% nebulizer solution USE 1 VIAL IN NEBULIZER EVERY 6 HOURS AS NEEDED FOR WHEEZING AND FOR SHORTNESS OF BREATH (Patient taking differently: Take 2.5 mg by nebulization every 6 (six) hours as needed for wheezing or shortness of breath.) 150 mL 2   aspirin EC 81 MG tablet Take 81 mg by mouth daily. Swallow whole.  atorvastatin (LIPITOR) 40 MG tablet TAKE 1 TABLET BY MOUTH ONCE DAILY . APPOINTMENT REQUIRED FOR FUTURE REFILLS (Patient taking differently: Take 40 mg by mouth daily.) 30 tablet 5   Budeson-Glycopyrrol-Formoterol (BREZTRI AEROSPHERE) 160-9-4.8 MCG/ACT AERO Take 2 puffs first thing in am and then another 2 puffs about 12 hours later. (Patient taking differently: Take 2 puffs by mouth every 12 (twelve) hours.) 10.7 g 11   cyclobenzaprine (FLEXERIL) 10 MG tablet Take 10 mg by mouth at bedtime.     furosemide  (LASIX) 20 MG tablet TAKE 1 TABLET BY MOUTH ONCE DAILY 30 tablet 11   gabapentin (NEURONTIN) 600 MG tablet Take 600 mg by mouth every 8 (eight) hours.     loperamide (IMODIUM) 2 MG capsule Take 1 capsule (2 mg total) by mouth as needed for diarrhea or loose stools. 30 capsule 0   losartan (COZAAR) 50 MG tablet Take 1 tablet (50 mg total) by mouth daily. 90 tablet 3   Melatonin 10 MG TABS Take 1 tablet by mouth daily.     Multiple Vitamin (MULTIVITAMIN WITH MINERALS) TABS tablet Take 1 tablet by mouth daily.     oxyCODONE-acetaminophen (PERCOCET) 10-325 MG tablet Take 1 tablet by mouth 2 (two) times daily as needed (back pain.).      PROAIR HFA 108 (90 Base) MCG/ACT inhaler INHALE 2 PUFFS BY MOUTH EVERY 6 HOURS AS NEEDED FOR WHEEZING FOR SHORTNESS OF BREATH (Patient taking differently: Inhale 2 puffs into the lungs every 6 (six) hours as needed for wheezing or shortness of breath.) 18 g 3   saccharomyces boulardii (FLORASTOR) 250 MG capsule Take 1 capsule (250 mg total) by mouth 2 (two) times daily. 60 capsule 0   sodium chloride (OCEAN) 0.65 % nasal spray Place 1 spray into the nose daily as needed for congestion.     vitamin B-12 (CYANOCOBALAMIN) 1000 MCG tablet Take 1,000 mcg by mouth daily.     vitamin C (ASCORBIC ACID) 500 MG tablet Take 500 mg by mouth daily.     furosemide (LASIX) 20 MG tablet Take 1 tablet (20 mg total) by mouth daily. 30 tablet 0   predniSONE (DELTASONE) 10 MG tablet 2 until better then one daily (Patient taking differently: See admin instructions. 2 until better then one daily) 100 tablet 2   sildenafil (REVATIO) 20 MG tablet Take 20 mg by mouth as needed.      Physical exam:  Vitals:   11/23/20 1448 11/23/20 1500 11/23/20 1515 11/23/20 1524  BP: 127/80 (!) 138/52 (!) 128/115   Pulse: (!) 103 (!) 106 98   Resp: 17 (!) 22 17   Temp:      SpO2: 97% 95% (!) 68%   Weight:    103.3 kg    Diffuse lower extremity edema extending from the hip all the way into the foot  feet are pink and warm  Wound on the lateral aspect of the right foot 8 cm x 4 cm x 1 cm depth with healthy appearing granulation tissue but some yellowish drainage foot has some Charcot changes and is edematous.  There is also a 2 x 2 centimeter wound with some granulation tissue on the dorsal aspect of the foot.  He does not have palpable pulses in the feet but I suspect some of this is secondary to edema.  Previous arteriogram recently showed mild tibial disease.  Data:  CBC    Component Value Date/Time   WBC 25.0 (H) 11/23/2020 1358   RBC 4.36 11/23/2020 1358  HGB 11.9 (L) 11/23/2020 1358   HCT 37.4 (L) 11/23/2020 1358   PLT 570 (H) 11/23/2020 1358   MCV 85.8 11/23/2020 1358   MCH 27.3 11/23/2020 1358   MCHC 31.8 11/23/2020 1358   RDW 16.5 (H) 11/23/2020 1358   LYMPHSABS 2.7 11/23/2020 1358   MONOABS 1.5 (H) 11/23/2020 1358   EOSABS 0.1 11/23/2020 1358   BASOSABS 0.2 (H) 11/23/2020 1358    BMET    Component Value Date/Time   NA 132 (L) 11/23/2020 1358   NA 140 01/04/2019 1451   K 3.6 11/23/2020 1358   CL 94 (L) 11/23/2020 1358   CO2 27 11/23/2020 1358   GLUCOSE 112 (H) 11/23/2020 1358   BUN 34 (H) 11/23/2020 1358   BUN 12 01/04/2019 1451   CREATININE 1.17 11/23/2020 1358   CALCIUM 8.5 (L) 11/23/2020 1358   GFRNONAA >60 11/23/2020 1358   GFRAA 91 01/04/2019 1451    Right foot x-ray cortical destruction of right fifth metatarsal  Assessment: Possible recurrent osteomyelitis right fifth metatarsal and may need further debridement of his right foot.  Would continue to cover with antibiotics as you are doing  Plan: Potentially patient may need further debridement of his right foot later in the week.  We will need to have his lungs improved a little bit prior to this.  In light of the patient's multiple medical problems of fairly severe nature it may be worthwhile to get a palliative care consult.  Will get MRI of foot to determine extent of osteomyelitis as if this is  fairly extensive he may require below-knee amputation.  Ruta Hinds, MD [facility] Office: [phone]

## 2020-11-23 NOTE — ED Provider Notes (Signed)
Emergency Medicine Provider Triage Evaluation Note  Joel Perez , a 75 y.o. male  was evaluated in triage.  Pt complains of generalized weakness and foot wound.  Patient reports that he had his right fifth toe amputated in April.  Since then he has had a wound that has not healed.  States that over the last few weeks wound has been producing increasing amounts of purulent discharge and has a foul odor.  Over the last 2 weeks patient states that he has been having generalized weakness and decreased appetite.  Patient denies any fevers, chills, nausea, vomiting, shortness of breath.  Review of Systems  Positive: Generalized weakness, fatigue, decreased appetite, wound Negative: Fevers, chills, nausea, vomiting, shortness of breath  Physical Exam  BP (!) 147/125 (BP Location: Right Arm)   Pulse (!) 56   Temp 98.8 F (37.1 C)   Resp 18   SpO2 96%  Gen:   Awake, no distress   Resp:  Normal effort  MSK:   Moves extremities without difficulty  Other:  Status post right fifth toe amputation.  Patient has wound extending along the lateral aspect of his right foot, copious amounts of purulent discharge noted soaking through his bandages.   Medical Decision Making  Medically screening exam initiated at 1:58 PM.  Appropriate orders placed.  [name] was informed that the remainder of the evaluation will be completed by another provider, this initial triage assessment does not replace that evaluation, and the importance of remaining in the ED until their evaluation is complete.  Concern for osteomyelitis/sepsis.  Will make patient level 2 and then back to next available room.   Loni Beckwith, PA-C 11/23/20 1735    Arnaldo Natal, MD 11/23/20 2049

## 2020-11-23 NOTE — Progress Notes (Signed)
Received call from Lab,patient is (+) for Covid. Isolation in place. Hall,MD notified via secure chat. Order received. Will continue to monitor.

## 2020-11-23 NOTE — Progress Notes (Signed)
Pharmacy Antibiotic Note  Joel Perez is a 75 y.o. male admitted on 11/23/2020 with  wound infection .  Pharmacy has been consulted for cefepime and vancomycin dosing.  Patient reports increasing drainage from wound to the right foot.  He is status post right fifth toe amputation in April.  Patient's SCr is slightly above baseline at 1.17 (BL ~0.8).  Plan: Cefepime 2g q8h Vancomycin 2g once then 1250mg  q24h unless there is a change in renal function. eAUC 486.8. Pharmacy to adjust dosing/frequency as needed per renal function Follow up with consulting services and with cultures for antibiotic streamline opportunities.  Weight: 103.3 kg (227 lb 11.8 oz)  Temp (24hrs), Avg:98.8 F (37.1 C), Min:98.8 F (37.1 C), Max:98.8 F (37.1 C)  Recent Labs  Lab 11/23/20 1358 11/23/20 1538  WBC 25.0*  --   CREATININE 1.17  --   LATICACIDVEN 2.8* 2.2*    Estimated Creatinine Clearance: 63.6 mL/min (by C-G formula based on SCr of 1.17 mg/dL).    Allergies  Allergen Reactions   Other Swelling and Other (See Comments)    Farmed Fish (tightness in throat & lip swelling)   Codeine Rash   Sulfa Antibiotics Hives    Antimicrobials this admission: Cefepime 7/3 >>  Vancomycin 7/3 >>   Microbiology results: Pending  Thank you for allowing pharmacy to be a part of this patient's care.  Lorelei Pont, PharmD, BCPS 11/23/2020 4:32 PM ED Clinical Pharmacist -  [phone]

## 2020-11-23 NOTE — ED Notes (Signed)
Attempted to call report, advised that they will call back in a few minutes.

## 2020-11-23 NOTE — Progress Notes (Signed)
Received a call from bedside RN regarding the patient testing positive for Covid-19 on his screening test.  Reviewed his CXR and it appears he has bibasilar lung opacities R>L.  Covid-19 directed therapies started including IV Remdesivir, pulmonary toilet, bronchodilators, vitamin C, D3, and zinc.  He is also on IV antibiotics empirically for osteomyelitis which would cover possible superimposed bacterial pulmonary infection.  Follow inflammatory markers and procalcitonin level in the AM.

## 2020-11-23 NOTE — H&P (Signed)
History and Physical:    Joel Perez   TGG:[id number] DOB: September 01, 1945 DOA: 11/23/2020  Referring MD/provider: Dr. Francia Greaves PCP: [name], MD   Patient coming from: Home  Chief Complaint: "I was told to come in because my foot looked bad 2 days ago but I was hardheaded and put it off and now I am so weak I cannot get out of my chair".  History of Present Illness:   Joel Perez is an 75 y.o. male with PMH significant for PVD s/p  fifth ray amputation 08/28/2020 by vascular surgery Dr. Stanford Breed, chronic hypoxic respiratory failure on 4 L home O2 with recent admission for severe sepsis secondary to RLL pneumonia, known abnormal mass on chest CT followed by Dr. Leonides Schanz as an outpatient, HFpEF was told to come in 2 days ago by his home health nurse because his foot looked bad.  Patient had completed outpatient course of cefazolin 3 weeks prior and his foot was apparently healing well until 2 days ago when his nurse apparently noted purulent discharge.  She asked him to come into the ED.  Patient states he did not want to come in until today when he had progressive weakness such that he was unable to get up out of his elevator chair which she has been sitting in for the past 3 to 4 days.  Patient apparently has been stooling in his chair and his wife has been trying to clean him up.  He also apparently has developed some "bedsores" which are bothering him.  Review of systems is notable for weakness x2 to 3 weeks much worse over the past 3 days.  He has chronic shortness of breath which is unchanged but his exercise tolerance consists of going from his recliner to the bathroom however has not been able to even go to the bathroom for the past 3 days.  He has had a cough for the past 3 weeks which is productive.  Of note he has been on prednisone anywhere from 40 to 20 mg daily for the past 2 to 3 weeks.  At present he is on 20 mg daily.  He has been eating and drinking okay.  He did have 1 episode of  diarrhea yesterday.  ED Course:  The patient was noted to be afebrile, tachycardic and normotensive.  He was noted to have a foul-smelling infected wound on his lateral right foot.  X-rays consistent with osteomyelitis.  Patient also underwent a chest CT which shows recurrent/persistent RLL pneumonia.  He also has a spiculated 1.5 cm spiculated mass in the RUL which is also without change.  Laboratory data are notable for a WBC of 25, up from 9.2 at last check.  Mild hyponatremia at 132.  Fattening is 1.17 up from 0.8 at last check.  Lactic acid was 2.8, decreased to 2.2.  ROS:   ROS   Review of Systems: per HPI   Past Medical History:   Past Medical History:  Diagnosis Date   Blind right eye 1985   Following a work accident   BPH (benign prostatic hyperplasia)    Capsular cataract of left eye    COPD (chronic obstructive pulmonary disease) ([place])    Dr. [name]   Current smoker    Long-term smoker. Not interested in quitting   Diverticulosis    With intermittent diverticulitis   Erectile dysfunction    Essential hypertension    Several recorded blood pressures greater than 140/90.   H/O mitral valve  prolapse    By report, but not confirmed by echo.   Low back pain    Chronic. Followed by Dr. Suella Broad   Mild aortic regurgitation 12/2013   Mild to moderate aortic regurgitation on echo   Neuropathy     Past Surgical History:   Past Surgical History:  Procedure Laterality Date   ABDOMINAL AORTOGRAM W/LOWER EXTREMITY N/A 08/27/2020   Procedure: ABDOMINAL AORTOGRAM W/LOWER EXTREMITY;  Surgeon: Cherre Robins, MD;  Location: [facility];  Service: Cardiovascular;  Laterality: N/A;   AMPUTATION Right 08/28/2020   Procedure: AMPUTATION RAY FIFTH TOE;  Surgeon: Cherre Robins, MD;  Location: [facility];  Service: Vascular;  Laterality: Right;   APPLICATION OF WOUND VAC Right 08/28/2020   Procedure: APPLICATION OF WOUND VAC, right foot;  Surgeon: Cherre Robins, MD;  Location:  [facility];  Service: Vascular;  Laterality: Right;   COLON SURGERY  2000   COLONOSCOPY WITH PROPOFOL N/A 03/18/2020   Procedure: COLONOSCOPY WITH PROPOFOL;  Surgeon: Otis Brace, MD;  Location: [facility];  Service: Gastroenterology;  Laterality: N/A;   ELBOW SURGERY     POLYPECTOMY  03/18/2020   Procedure: POLYPECTOMY;  Surgeon: Otis Brace, MD;  Location: [facility];  Service: Gastroenterology;;   TRANSTHORACIC ECHOCARDIOGRAM  12/29/2016   EF 60-65%. Moderate LVH. Mild aortic valve calcification. Mild to moderate regurgitation with no stenosis. Mitral valve annular calcification with no comment of prolapse or regurgitation.    Social History:   Social History   Socioeconomic History   Marital status: Married    Spouse name: Not on file   Number of children: Not on file   Years of education: Not on file   Highest education level: Not on file  Occupational History   Occupation: [employer]   Tobacco Use   Smoking status: Former    Packs/day: 2.00    Years: 50.00    Pack years: 100.00    Types: Cigarettes    Quit date: 03/16/2018    Years since quitting: 2.6   Smokeless tobacco: Never  Vaping Use   Vaping Use: Some days   Start date: 03/16/2018  Substance and Sexual Activity   Alcohol use: Yes    Alcohol/week: 0.0 standard drinks   Drug use: No   Sexual activity: Not on file  Other Topics Concern   Not on file  Social History Narrative   Retired Animal nutritionist.   He lives with wife.    Highest level of education:  Trade school   Social Determinants of Health   Financial Resource Strain: Not on file  Food Insecurity: Not on file  Transportation Needs: Not on file  Physical Activity: Not on file  Stress: Not on file  Social Connections: Not on file  Intimate Partner Violence: Not on file    Allergies   Other, Codeine, and Sulfa antibiotics  Family history:   Family History  Problem Relation Age of Onset   Heart disease Father     Breast cancer Mother    Other Sister    Brain cancer Brother     Current Medications:   Prior to Admission medications   Medication Sig Start Date End Date Taking? Authorizing Provider  albuterol (PROVENTIL) (2.5 MG/3ML) 0.083% nebulizer solution USE 1 VIAL IN NEBULIZER EVERY 6 HOURS AS NEEDED FOR WHEEZING AND FOR SHORTNESS OF BREATH Patient taking differently: Take 2.5 mg by nebulization every 6 (six) hours as needed for wheezing or shortness of breath. 01/14/20  Tanda Rockers, MD  aspirin EC 81 MG tablet Take 81 mg by mouth daily. Swallow whole.    [provider]  atorvastatin (LIPITOR) 40 MG tablet TAKE 1 TABLET BY MOUTH ONCE DAILY . APPOINTMENT REQUIRED FOR FUTURE REFILLS Patient taking differently: Take 40 mg by mouth daily. 07/15/20   Wellington Hampshire, MD  Budeson-Glycopyrrol-Formoterol (BREZTRI AEROSPHERE) 160-9-4.8 MCG/ACT AERO Take 2 puffs first thing in am and then another 2 puffs about 12 hours later. 08/13/20   Tanda Rockers, MD  cyclobenzaprine (FLEXERIL) 10 MG tablet Take 10 mg by mouth at bedtime.    [provider]  furosemide (LASIX) 20 MG tablet TAKE 1 TABLET BY MOUTH ONCE DAILY 09/15/20   Wellington Hampshire, MD  furosemide (LASIX) 20 MG tablet Take 1 tablet (20 mg total) by mouth daily. 10/06/20 11/05/20  Lorretta Harp, MD  gabapentin (NEURONTIN) 600 MG tablet Take 600 mg by mouth every 8 (eight) hours. 02/25/20   [provider]  loperamide (IMODIUM) 2 MG capsule Take 1 capsule (2 mg total) by mouth as needed for diarrhea or loose stools. 09/03/20   Debbe Odea, MD  losartan (COZAAR) 50 MG tablet Take 1 tablet (50 mg total) by mouth daily. 01/08/20   Wellington Hampshire, MD  Multiple Vitamin (MULTIVITAMIN WITH MINERALS) TABS tablet Take 1 tablet by mouth daily.    [provider]  oxyCODONE-acetaminophen (PERCOCET) 10-325 MG tablet Take 1 tablet by mouth 2 (two) times daily as needed (back pain.).     [provider]  predniSONE  (DELTASONE) 10 MG tablet 2 until better then one daily Patient taking differently: See admin instructions. 2 until better then one daily 08/13/20   Tanda Rockers, MD  PROAIR HFA 108 [phone] Base) MCG/ACT inhaler INHALE 2 PUFFS BY MOUTH EVERY 6 HOURS AS NEEDED FOR WHEEZING FOR SHORTNESS OF BREATH Patient taking differently: Inhale 2 puffs into the lungs every 6 (six) hours as needed for wheezing or shortness of breath. 07/07/20   Tanda Rockers, MD  saccharomyces boulardii (FLORASTOR) 250 MG capsule Take 1 capsule (250 mg total) by mouth 2 (two) times daily. 09/03/20   Debbe Odea, MD  sildenafil (REVATIO) 20 MG tablet Take 20 mg by mouth as needed.    [provider]  sodium chloride (OCEAN) 0.65 % nasal spray 2 sprays in each nostril as needed    [provider]  vitamin B-12 (CYANOCOBALAMIN) 1000 MCG tablet Take 1,000 mcg by mouth daily.    [provider]    Physical Exam:   Vitals:   11/23/20 1448 11/23/20 1500 11/23/20 1515 11/23/20 1524  BP: 127/80 (!) 138/52 (!) 128/115   Pulse: (!) 103 (!) 106 98   Resp: 17 (!) 22 17   Temp:      SpO2: 97% 95% (!) 68%   Weight:    103.3 kg     Physical Exam: Blood pressure (!) 128/115, pulse 98, temperature 98.8 F (37.1 C), resp. rate 17, weight 103.3 kg, SpO2 (!) 68 %. Gen: Chronically ill-appearing man with tachypnea but no increased work of breathing lying in bed with wife at bedside Eyes: sclera anicteric, conjuctiva mildly injected bilaterally CVS: S1-S2, regular, tacky, no gallops Respiratory:  decreased air entry with no wheezes GI: Obese, NABS, soft, NT  LE: Massive brawny edema bilaterally with stasis dermatitis.  Right lateral foot with bandage covering purulent discharge.  Also has a dry appearing ulcer on the top of his right foot.  Please see pictures on note by Dr. Francia Greaves ED physician. Neuro:  grossly nonfocal.  Psych: judgement and insight are poor, mood and affect appropriate to situation.   Data  Review:    Labs: Basic Metabolic Panel: Recent Labs  Lab 11/23/20 1358  NA 132*  K 3.6  CL 94*  CO2 27  GLUCOSE 112*  BUN 34*  CREATININE 1.17  CALCIUM 8.5*   Liver Function Tests: Recent Labs  Lab 11/23/20 1358  AST 35  ALT 26  ALKPHOS 70  BILITOT 1.1  PROT 6.0*  ALBUMIN 2.5*   No results for input(s): LIPASE, AMYLASE in the last 168 hours. No results for input(s): AMMONIA in the last 168 hours. CBC: Recent Labs  Lab 11/23/20 1358  WBC 25.0*  NEUTROABS 19.2*  HGB 11.9*  HCT 37.4*  MCV 85.8  PLT 570*   Cardiac Enzymes: No results for input(s): CKTOTAL, CKMB, CKMBINDEX, TROPONINI in the last 168 hours.  BNP (last 3 results) No results for input(s): PROBNP in the last 8760 hours. CBG: No results for input(s): GLUCAP in the last 168 hours.  Urinalysis    Component Value Date/Time   COLORURINE YELLOW 08/25/2020 1620   APPEARANCEUR CLEAR 08/25/2020 1620   LABSPEC 1.008 08/25/2020 1620   PHURINE 8.0 08/25/2020 1620   GLUCOSEU NEGATIVE 08/25/2020 1620   HGBUR SMALL (A) 08/25/2020 1620   BILIRUBINUR NEGATIVE 08/25/2020 1620   KETONESUR NEGATIVE 08/25/2020 1620   PROTEINUR NEGATIVE 08/25/2020 1620   NITRITE NEGATIVE 08/25/2020 1620   LEUKOCYTESUR NEGATIVE 08/25/2020 1620      Radiographic Studies: DG Chest 1 View  Result Date: 11/23/2020 CLINICAL DATA:  Concern for osteomyelitis of the RIGHT foot. Recent partial RIGHT foot amputation. Weakness. EXAM: CHEST  1 VIEW COMPARISON:  07/22/2020 FINDINGS: Stable cardiomegaly. Patchy opacity is identified in the RIGHT lung base, consistent with infectious infiltrate. There is no pulmonary edema. Within the RIGHT UPPER lobe, a 1.5 centimeter spiculated mass and adjacent satellite nodule are again noted. IMPRESSION: 1. RIGHT LOWER lobe infiltrates. 2. RIGHT UPPER lobe masses, as seen on prior exams. Electronically Signed   By: Nolon Nations M.D.   On: 11/23/2020 14:35   DG Foot Complete Right  Result Date:  11/23/2020 CLINICAL DATA:  Recent partial amputation, concern for osteomyelitis. EXAM: RIGHT FOOT COMPLETE - 3+ VIEW COMPARISON:  08/25/2020 FINDINGS: Prior amputation of the small toe. Recognizable 3.1 cm proximal portion of the fifth metatarsal noted, lung distal margin of this there is a 3.8 cm region of amorphous hazy calcification suspicious for a destroyed bony fragment from osteomyelitis, less likely to be heterotopic ossification along the prior amputation. No appreciable destructive findings along the remaining metatarsals or phalanges. Mildly exaggerated longitudinal arch of the foot on the lateral projection which is not obtained with the patient standing. Extensive soft tissue swelling along the forefoot especially dorsally. Irregularity of the cutaneous surface along the amputation site and lateral to the fifth metatarsal. Small Achilles calcaneal spur. IMPRESSION: 1. 3.8 cm region of amorphous hazy calcification corresponding to the shaft of the fifth metatarsal, probably bony destruction from active osteomyelitis and less likely to from heterotopic ossification along the site of prior amputation. There is a 3.1 cm fragment of proximal fifth metatarsal which appears recognizable, proximal to this hazy calcification. Soft tissue swelling and irregularity of the overlying soft tissues. Electronically Signed   By: Van Clines M.D.   On: 11/23/2020 15:10    EKG: Independently reviewed.  NSR at 110.  LBBB.  Assessment/Plan:   Active Problems:   Neuropathy, alcoholic (HCC)   Chronic respiratory failure with hypoxia (HCC)   PVD (peripheral vascular disease) (HCC)   Multiple pulmonary nodules determined by computed tomography of lung   Osteomyelitis of ankle or foot, acute, left (HCC)   RLL pneumonia   Osteomyelitis after right fifth ray amputation inpatient with PVD and venous insufficiency Patient with recurrent osteomyelitis after right fifth ray amputation 08/28/2020 by Dr.  Stanford Breed Patient is 3 weeks out of outpatient course of Kefzol for MSSA and strep Will treat patient with cefepime and vancomycin, can consider addition of Flagyl if warranted. Of note patient has been on prednisone throughout for his chronic severe COPD Dr. Oneida Alar, vascular surgery called for consultation  Right lower lobe infiltrate Patient was discharged 07/26/2020 after severe sepsis secondary to RLL pneumonia Patient has recurrent/persistent RLL infiltrate He has chronic shortness of breath with extremely limited exercise tolerance. It is unclear to me if this is a new possibly postobstructive pneumonia or something else. He is afebrile but does have leukocytosis which may be secondary to the osteomyelitis Cefepime and vancomycin should cover his lungs as well as his foot.  Right upper lobe nodule 1.5 cm spiculated mass seen on previous CT March 2022 Patient is followed by Dr. [name], pulmonologist Per his note from 08/13/2020: "Given severity of his lung dz no options for bx or rx at this point - will arrange f/u CT at next ov depending on his clinical condition" Patient himself says he has no recollection of being told that he has masses in his lungs. Can call pulmonary consult if warranted, may want to discuss with Dr. [name] in the morning  Chronic hypoxic respiratory failure secondary to COPD on baseline 4 L oxygen Patient has been on 20 to 40 mg of prednisone for several weeks per his report Continue oxygen  Recent COVID infection Managed by Dr. [name] I do not believe patient got any Paxlovid  Bedsores Patient has been sitting in a recliner for the past 3 to 4 days and has been stooling on himself which his wife has been attempting to clean. He is states he is developed bedsores, wound consult placed  HFpEF Restart home meds once medicines have been reconciled Patient does not appear to be in pulmonary edema He clearly has massive total body water overload although some of this  is venous dysfunction with his chronic brawny lower extremity edema.  HTN Restart home meds once medicines have been reconciled     Other information:   DVT prophylaxis: Lovenox ordered. Code Status: Full Family Communication: Wife was at bedside throughout Disposition Plan: TBD Consults called: Vascular surgery Admission status: Inpatient  Orion Vandervort Tublu Kalynn Declercq [facility]  If 7PM-7AM, please contact night-coverage [email or web address]

## 2020-11-24 ENCOUNTER — Inpatient Hospital Stay (HOSPITAL_COMMUNITY): Payer: Medicare Other

## 2020-11-24 DIAGNOSIS — L089 Local infection of the skin and subcutaneous tissue, unspecified: Secondary | ICD-10-CM | POA: Diagnosis not present

## 2020-11-24 DIAGNOSIS — J9611 Chronic respiratory failure with hypoxia: Secondary | ICD-10-CM | POA: Diagnosis not present

## 2020-11-24 DIAGNOSIS — D72829 Elevated white blood cell count, unspecified: Secondary | ICD-10-CM | POA: Diagnosis not present

## 2020-11-24 DIAGNOSIS — J189 Pneumonia, unspecified organism: Secondary | ICD-10-CM | POA: Diagnosis not present

## 2020-11-24 LAB — BLOOD CULTURE ID PANEL (REFLEXED) - BCID2

## 2020-11-24 LAB — GLUCOSE, CAPILLARY
Glucose-Capillary: 89 mg/dL (ref 70–99)
Glucose-Capillary: 72 mg/dL (ref 70–99)
Glucose-Capillary: 157 mg/dL — ABNORMAL HIGH (ref 70–99)
Glucose-Capillary: 135 mg/dL — ABNORMAL HIGH (ref 70–99)

## 2020-11-24 LAB — COMPREHENSIVE METABOLIC PANEL
ALT: 21 U/L (ref 0–44)
AST: 28 U/L (ref 15–41)
Albumin: 1.8 g/dL — ABNORMAL LOW (ref 3.5–5.0)
Alkaline Phosphatase: 59 U/L (ref 38–126)
Anion gap: 6 (ref 5–15)
BUN: 23 mg/dL (ref 8–23)
CO2: 27 mmol/L (ref 22–32)
Calcium: 7.6 mg/dL — ABNORMAL LOW (ref 8.9–10.3)
Chloride: 101 mmol/L (ref 98–111)
Creatinine, Ser: 0.87 mg/dL (ref 0.61–1.24)
GFR, Estimated: >60 mL/min (ref >60)
Glucose, Bld: 96 mg/dL (ref 70–99)
Potassium: 3.1 mmol/L — ABNORMAL LOW (ref 3.5–5.1)
Sodium: 134 mmol/L — ABNORMAL LOW (ref 135–145)
Total Bilirubin: 1 mg/dL (ref 0.3–1.2)
Total Protein: 4.6 g/dL — ABNORMAL LOW (ref 6.5–8.1)

## 2020-11-24 LAB — CBC WITH DIFFERENTIAL/PLATELET
Abs Immature Granulocytes: 1.05 10*3/uL — ABNORMAL HIGH (ref 0.00–0.07)
Basophils Absolute: 0.1 10*3/uL (ref 0.0–0.1)
Basophils Relative: 0 %
Eosinophils Absolute: 0.3 10*3/uL (ref 0.0–0.5)
Eosinophils Relative: 1 %
HCT: 32.6 % — ABNORMAL LOW (ref 39.0–52.0)
Hemoglobin: 10.6 g/dL — ABNORMAL LOW (ref 13.0–17.0)
Immature Granulocytes: 5 %
Lymphocytes Relative: 9 %
Lymphs Abs: 2 10*3/uL (ref 0.7–4.0)
MCH: 27.2 pg (ref 26.0–34.0)
MCHC: 32.5 g/dL (ref 30.0–36.0)
MCV: 83.8 fL (ref 80.0–100.0)
Monocytes Absolute: 1.3 10*3/uL — ABNORMAL HIGH (ref 0.1–1.0)
Monocytes Relative: 6 %
Neutro Abs: 18.2 10*3/uL — ABNORMAL HIGH (ref 1.7–7.7)
Neutrophils Relative %: 79 %
Platelets: 459 10*3/uL — ABNORMAL HIGH (ref 150–400)
RBC: 3.89 MIL/uL — ABNORMAL LOW (ref 4.22–5.81)
RDW: 16.2 % — ABNORMAL HIGH (ref 11.5–15.5)
WBC: 22.9 10*3/uL — ABNORMAL HIGH (ref 4.0–10.5)
nRBC: 0.1 % (ref 0.0–0.2)

## 2020-11-24 LAB — D-DIMER, QUANTITATIVE: D-Dimer, Quant: 16.63 ug/mL-FEU — ABNORMAL HIGH (ref 0.00–0.50)

## 2020-11-24 LAB — PROCALCITONIN: Procalcitonin: 0.29 ng/mL

## 2020-11-24 LAB — FERRITIN: Ferritin: 291 ng/mL (ref 24–336)

## 2020-11-24 LAB — MAGNESIUM: Magnesium: 1.9 mg/dL (ref 1.7–2.4)

## 2020-11-24 LAB — C-REACTIVE PROTEIN: CRP: 24.6 mg/dL — ABNORMAL HIGH (ref <1.0)

## 2020-11-24 LAB — PHOSPHORUS: Phosphorus: 3.1 mg/dL (ref 2.5–4.6)

## 2020-11-24 MED ORDER — ALBUTEROL SULFATE HFA 108 (90 BASE) MCG/ACT IN AERS
2.0000 | INHALATION_SPRAY | Freq: Four times a day (QID) | RESPIRATORY_TRACT | Status: DC | PRN
  Administered 2020-11-29 (×2): 2 via RESPIRATORY_TRACT

## 2020-11-24 MED ORDER — ZINC OXIDE 40 % EX OINT
1.0000 "application " | TOPICAL_OINTMENT | Freq: Every day | CUTANEOUS | Status: DC | PRN
  Administered 2020-11-24 – 2020-12-04 (×4): 1 via TOPICAL
  Filled 2020-11-24 (×4): qty 57

## 2020-11-24 MED ORDER — PROSOURCE PLUS PO LIQD
30.0000 mL | Freq: Two times a day (BID) | ORAL | Status: DC
  Administered 2020-11-24 – 2020-12-09 (×28): 30 mL via ORAL
  Filled 2020-11-24 (×28): qty 30

## 2020-11-24 MED ORDER — CEFAZOLIN SODIUM-DEXTROSE 2-4 GM/100ML-% IV SOLN
2.0000 g | Freq: Three times a day (TID) | INTRAVENOUS | Status: DC
  Administered 2020-11-24 – 2020-11-27 (×8): 2 g via INTRAVENOUS
  Filled 2020-11-24 (×9): qty 100

## 2020-11-24 MED ORDER — POTASSIUM CHLORIDE CRYS ER 20 MEQ PO TBCR
40.0000 meq | EXTENDED_RELEASE_TABLET | Freq: Once | ORAL | Status: AC
  Administered 2020-11-24: 40 meq via ORAL
  Filled 2020-11-24: qty 2

## 2020-11-24 MED ORDER — OXYCODONE HCL 5 MG PO TABS
5.0000 mg | ORAL_TABLET | Freq: Four times a day (QID) | ORAL | Status: DC | PRN
  Administered 2020-11-25: 5 mg via ORAL
  Filled 2020-11-24: qty 1

## 2020-11-24 MED ORDER — ENSURE ENLIVE PO LIQD
237.0000 mL | Freq: Three times a day (TID) | ORAL | Status: DC
  Administered 2020-11-24 – 2020-12-09 (×43): 237 mL via ORAL

## 2020-11-24 NOTE — Progress Notes (Signed)
Initial Nutrition Assessment  DOCUMENTATION CODES:   Obesity unspecified  INTERVENTION:  -Ensure Enlive po TID, each supplement provides 350 kcal and 20 grams of protein -PROSource PLUS PO 80mls BID, each supplement provides 100 kcals and 15 grams of protein -MVI with minerals daily   NUTRITION DIAGNOSIS:   Increased nutrient needs related to wound healing as evidenced by estimated needs.  GOAL:   Patient will meet greater than or equal to 90% of their needs  MONITOR:   PO intake, Supplement acceptance, Labs, Weight trends, Skin, I & O's  REASON FOR ASSESSMENT:   Consult Wound healing  ASSESSMENT:   Pt with PMH significant for PVD s/p 5th ray amputation (08/28/20), chronic hypoxic respiratory failure on 4L home O2 with recent admission for severe sepsis 2/2 RLL PNA, known abdominal mass on chest CT (following outpatient), and HFpEF admitted with recurrent osteomyelitis.  Pt unavailable at time of RD visit. Per H&P, pt has been c/o weakness and has been confined to his chair for the past 3-4 days, even noted to be stooling in his chair. Pt now noted to have bedsores. Reports having been eating and drinking okay and having 1x episode of diarrhea yesterday.   No po intake documented.   UOP: 3 unmeasured occurrences x24 hours Stool: 1 unmeasured occurrence x24 hours  Medications:  (feeding supplement) PROSource Plus  30 mL Oral BID BM   vitamin C  500 mg Oral Daily   cholecalciferol  1,000 Units Oral Daily   enoxaparin (LOVENOX) injection  40 mg Subcutaneous Q24H   feeding supplement  237 mL Oral TID BM   folic acid  1 mg Oral Daily   insulin aspart  0-5 Units Subcutaneous QHS   insulin aspart  0-9 Units Subcutaneous TID WC   multivitamin with minerals  1 tablet Oral Daily   predniSONE  20 mg Oral Q breakfast   sodium chloride flush  3 mL Intravenous Q12H   thiamine  100 mg Oral Daily   zinc sulfate  220 mg Oral Daily   Labs: Recent Labs  Lab 11/23/20 1358  11/24/20 0406  NA 132* 134*  K 3.6 3.1*  CL 94* 101  CO2 27 27  BUN 34* 23  CREATININE 1.17 0.87  CALCIUM 8.5* 7.6*  MG  --  1.9  PHOS  --  3.1  GLUCOSE 112* 96  CBGs 72-09-47  Diet Order:   Diet Order             Diet Heart Room service appropriate? Yes; Fluid consistency: Thin  Diet effective now                   EDUCATION NEEDS:   No education needs have been identified at this time  Skin:  Skin Assessment: Skin Integrity Issues: Skin Integrity Issues:: Other (Comment), Unstageable, Incisions Unstageable: R buttocks Incisions: R foot Other: MASD R/L buttocks; Non-pressure wound R tibial, R foot x2  Last BM:  7/4 type 6  Height:   Ht Readings from Last 1 Encounters:  10/21/20 5\' 10"  (1.778 m)    Weight:   Wt Readings from Last 1 Encounters:  11/23/20 103.3 kg    Ideal Body Weight:  75.45 kg  BMI:  Body mass index is 32.68 kg/m.  Estimated Nutritional Needs:   Kcal:  2400-2600  Protein:  120-130g  Fluid:  >2L    Larkin Ina, MS, RD, LDN (she/her/hers) RD pager number and weekend/on-call pager number located in [place].

## 2020-11-24 NOTE — Progress Notes (Signed)
Progress Note    Joel Perez  WPY:[id number] DOB: [birth year]  DOA: 11/23/2020 PCP: [name], MD    Brief Narrative:     Medical records reviewed and are as summarized below:  Joel Perez is an 75 y.o. male with PMH significant for PVD s/p  fifth ray amputation 08/28/2020 by vascular surgery Dr. Stanford Breed, chronic hypoxic respiratory failure on 4 L home O2 with recent admission for severe sepsis secondary to RLL pneumonia, known abnormal mass on chest CT followed by Dr. Leonides Schanz as an outpatient, HFpEF was told to come in 2 days ago by his home health nurse because his foot looked bad.  Patient had completed outpatient course of cefazolin 3 weeks prior and his foot was apparently healing well until 2 days ago when his nurse apparently noted purulent discharge.  Recent COVID 19 infection per home health (June 2022).   Assessment/Plan:   Active Problems:   Neuropathy, alcoholic (HCC)   Chronic respiratory failure with hypoxia (HCC)   PVD (peripheral vascular disease) (HCC)   Multiple pulmonary nodules determined by computed tomography of lung   Osteomyelitis of ankle or foot, acute, left (HCC)   RLL pneumonia   Osteomyelitis after right fifth ray amputation inpatient with PVD and venous insufficiency Patient with recurrent osteomyelitis after right fifth ray amputation 08/28/2020 by Dr. Stanford Breed Patient is 3 weeks out of outpatient course of abx for MSSA and strep Will treat patient with cefepime and vancomycin, can consider addition of Flagyl if warranted. Vascular consult: Potentially patient may need further debridement of his right foot later in the week.  We will need to have his lungs improved a little bit prior to this.  In light of the patient's multiple medical problems of fairly severe nature it may be worthwhile to get a palliative care consult. Will get MRI of foot to determine extent of osteomyelitis as if this is fairly extensive he may require below-knee amputation.    Right lower lobe infiltrate Patient was discharged 07/26/2020 after severe sepsis secondary to RLL pneumonia Patient has recurrent/persistent RLL infiltrate He has chronic shortness of breath with extremely limited exercise tolerance. It is unclear to me if this is a new possibly postobstructive pneumonia or something else. He is afebrile but does have leukocytosis which may be secondary to the osteomyelitis Cefepime and vancomycin should cover his lungs as well as his foot.   Right upper lobe nodule 1.5 cm spiculated mass seen on previous CT March 2022 Patient is followed by Dr. [name], pulmonologist Per his note from 08/13/2020: "Given severity of his lung dz no options for bx or rx at this point - will arrange f/u CT at next ov depending on his clinical condition" Patient himself says he has no recollection of being told that he has masses in his lungs.   Chronic hypoxic respiratory failure secondary to COPD on baseline 4 L oxygen Patient has been on 20 to 40 mg of prednisone for several weeks per his report Continue home oxygen   Recent COVID infection Managed by Dr. [name] -repeat positive but > 10 days ago so will not treat    Pressure Injury 07/22/20 Buttocks Left Stage 2 -  Partial thickness loss of dermis presenting as a shallow open injury with a red, pink wound bed without slough. (Active)  07/22/20 1600  Location: Buttocks  Location Orientation: Left  Staging: Stage 2 -  Partial thickness loss of dermis presenting as a shallow open injury with a red, pink  wound bed without slough.  Wound Description (Comments):   Present on Admission: Yes     Pressure Injury 07/23/20 Heel Right Stage 3 -  Full thickness tissue loss. Subcutaneous fat may be visible but bone, tendon or muscle are NOT exposed. (Active)  07/23/20 0800  Location: Heel  Location Orientation: Right  Staging: Stage 3 -  Full thickness tissue loss. Subcutaneous fat may be visible but bone, tendon or muscle are NOT  exposed.  Wound Description (Comments):   Present on Admission: Yes     Pressure Injury 08/26/20 Thigh Posterior;Proximal;Right;Upper Stage 2 -  Partial thickness loss of dermis presenting as a shallow open injury with a red, pink wound bed without slough. Skin Tear just below Right Buttocks (Active)  08/26/20 1959  Location: Thigh  Location Orientation: Posterior;Proximal;Right;Upper  Staging: Stage 2 -  Partial thickness loss of dermis presenting as a shallow open injury with a red, pink wound bed without slough.  Wound Description (Comments): Skin Tear just below Right Buttocks  Present on Admission:      Pressure Injury 11/23/20 Buttocks Right;Lower Unstageable - Full thickness tissue loss in which the base of the injury is covered by slough (yellow, tan, gray, green or brown) and/or eschar (tan, brown or black) in the wound bed. (Active)  11/23/20   Location: Buttocks  Location Orientation: Right;Lower  Staging: Unstageable - Full thickness tissue loss in which the base of the injury is covered by slough (yellow, tan, gray, green or brown) and/or eschar (tan, brown or black) in the wound bed.  Wound Description (Comments):   Present on Admission: Yes   Patient has been sitting in a recliner for the past 3 to 4 days and has been stooling on himself which his wife has been attempting to clean.    HFpEF Restart home meds once medicines have been reconciled Patient does not appear to be in pulmonary edema   HTN Restart home meds once medicines have been reconciled   Hypokalemia -repelte  obesity Body mass index is 32.68 kg/m.   Family Communication/Anticipated D/C date and plan/Code Status   DVT prophylaxis: Lovenox ordered. Code Status: Full Code.  Family Communication: wife at bedside Disposition Plan: Status is: Inpatient  Remains inpatient appropriate because:Inpatient level of care appropriate due to severity of illness  Dispo: The patient is from: Home               Anticipated d/c is to:  tbd              Patient currently is not medically stable to d/c.   Difficult to place patient No         Medical Consultants:   Vascular WOC Will probably need palliative care  Subjective:   Has not been able to get up at home and has been in a lift chair for a prolonged period  Objective:    Vitals:   11/23/20 2024 11/24/20 0115 11/24/20 0507 11/24/20 1114  BP: 105/61 116/62 (!) 113/57 (!) 102/56  Pulse: (!) 53 85 65 92  Resp: 20 20 20 19   Temp: 97.6 F (36.4 C) 98.3 F (36.8 C) (!) 97.4 F (36.3 C)   TempSrc: Oral Oral Oral   SpO2: 96% 99% 96% 97%  Weight:        Intake/Output Summary (Last 24 hours) at 11/24/2020 1224 Last data filed at 11/24/2020 0215 Gross per 24 hour  Intake 1326.17 ml  Output 0 ml  Net 1326.17 ml   [employer]  11/23/20 1524  Weight: 103.3 kg    Exam:  General: Appearance:    Obese male - chronically ill appearing  skin Multiple small wounds-- appears moisture associated on buttocks  Lungs:     respirations unlabored  Heart:    Normal heart rate.       Neurologic:   Awake, alert     Data Reviewed:   I have personally reviewed following labs and imaging studies:  Labs: Labs show the following:   Basic Metabolic Panel: Recent Labs  Lab 11/23/20 1358 11/24/20 0406  NA 132* 134*  K 3.6 3.1*  CL 94* 101  CO2 27 27  GLUCOSE 112* 96  BUN 34* 23  CREATININE 1.17 0.87  CALCIUM 8.5* 7.6*  MG  --  1.9  PHOS  --  3.1   GFR Estimated Creatinine Clearance: 85.6 mL/min (by C-G formula based on SCr of 0.87 mg/dL). Liver Function Tests: Recent Labs  Lab 11/23/20 1358 11/24/20 0406  AST 35 28  ALT 26 21  ALKPHOS 70 59  BILITOT 1.1 1.0  PROT 6.0* 4.6*  ALBUMIN 2.5* 1.8*   No results for input(s): LIPASE, AMYLASE in the last 168 hours. No results for input(s): AMMONIA in the last 168 hours. Coagulation profile Recent Labs  Lab 11/23/20 1358  INR 1.0    CBC: Recent Labs  Lab  11/23/20 1358 11/24/20 0406  WBC 25.0* 22.9*  NEUTROABS 19.2* 18.2*  HGB 11.9* 10.6*  HCT 37.4* 32.6*  MCV 85.8 83.8  PLT 570* 459*   Cardiac Enzymes: No results for input(s): CKTOTAL, CKMB, CKMBINDEX, TROPONINI in the last 168 hours. BNP (last 3 results) No results for input(s): PROBNP in the last 8760 hours. CBG: Recent Labs  Lab 11/23/20 2331 11/24/20 0641 11/24/20 1202  GLUCAP 89 89 72   D-Dimer: Recent Labs    11/24/20 0406  DDIMER 16.63*   Hgb A1c: Recent Labs    11/23/20 1700  HGBA1C 6.5*   Lipid Profile: No results for input(s): CHOL, HDL, LDLCALC, TRIG, CHOLHDL, LDLDIRECT in the last 72 hours. Thyroid function studies: No results for input(s): TSH, T4TOTAL, T3FREE, THYROIDAB in the last 72 hours.  Invalid input(s): FREET3 Anemia work up: Recent Labs    11/24/20 0406  FERRITIN 291   Sepsis Labs: Recent Labs  Lab 11/23/20 1358 11/23/20 1538 11/24/20 0406  PROCALCITON  --   --  0.29  WBC 25.0*  --  22.9*  LATICACIDVEN 2.8* 2.2*  --     Microbiology Recent Results (from the past 240 hour(s))  Blood culture (routine single)     Status: None (Preliminary result)   Collection Time: 11/23/20  1:58 PM   Specimen: BLOOD RIGHT HAND  Result Value Ref Range Status   Specimen Description BLOOD RIGHT HAND  Final   Special Requests   Final    BOTTLES DRAWN AEROBIC AND ANAEROBIC Blood Culture adequate volume   Culture   Final    NO GROWTH < 24 HOURS Performed at [facility], [place] [address], [place], [place] [address]    Report Status PENDING  Incomplete  Culture, blood (routine x 2)     Status: None (Preliminary result)   Collection Time: 11/23/20  3:39 PM   Specimen: BLOOD  Result Value Ref Range Status   Specimen Description BLOOD SITE NOT SPECIFIED  Final   Special Requests   Final    BOTTLES DRAWN AEROBIC AND ANAEROBIC Blood Culture results may not be optimal due to an excessive  volume of blood received in culture bottles   Culture    Final    NO GROWTH < 24 HOURS Performed at [facility], [place] [address], [place], [place] [address]    Report Status PENDING  Incomplete  Culture, blood (routine x 2)     Status: None (Preliminary result)   Collection Time: 11/23/20  3:57 PM   Specimen: BLOOD  Result Value Ref Range Status   Specimen Description BLOOD SITE NOT SPECIFIED  Final   Special Requests   Final    BOTTLES DRAWN AEROBIC AND ANAEROBIC Blood Culture results may not be optimal due to an inadequate volume of blood received in culture bottles   Culture   Final    NO GROWTH < 24 HOURS Performed at [facility], [place] [address], [place], [place] [address]    Report Status PENDING  Incomplete  Resp Panel by RT-PCR (Flu A&B, Covid) Nasopharyngeal Swab     Status: Abnormal   Collection Time: 11/23/20  7:27 PM   Specimen: Nasopharyngeal Swab; Nasopharyngeal(NP) swabs in vial transport medium  Result Value Ref Range Status   SARS Coronavirus 2 by RT PCR POSITIVE (A) NEGATIVE Final    Comment: RESULT CALLED TO, READ BACK BY AND VERIFIED WITH: RN BOKIAGON C. BY MESSAN H. AT 2055 ON 11/23/2020 (NOTE) SARS-CoV-2 target nucleic acids are DETECTED.  The SARS-CoV-2 RNA is generally detectable in upper respiratory specimens during the acute phase of infection. Positive results are indicative of the presence of the identified virus, but do not rule out bacterial infection or co-infection with other pathogens not detected by the test. Clinical correlation with patient history and other diagnostic information is necessary to determine patient infection status. The expected result is Negative.  Fact Sheet for Patients: EntrepreneurPulse.com.au  Fact Sheet for Healthcare Providers: IncredibleEmployment.be  This test is not yet approved or cleared by the [place] FDA and  has been authorized for detection and/or diagnosis of SARS-CoV-2 by FDA under an Emergency Use  Authorization (EUA).  This EUA will remain in effect (meaning th is test can be used) for the duration of  the COVID-19 declaration under Section 564(b)(1) of the Act, 21 U.S.C. section 360bbb-3(b)(1), unless the authorization is terminated or revoked sooner.     Influenza A by PCR NEGATIVE NEGATIVE Final   Influenza B by PCR NEGATIVE NEGATIVE Final    Comment: (NOTE) The Xpert Xpress SARS-CoV-2/FLU/RSV plus assay is intended as an aid in the diagnosis of influenza from Nasopharyngeal swab specimens and should not be used as a sole basis for treatment. Nasal washings and aspirates are unacceptable for Xpert Xpress SARS-CoV-2/FLU/RSV testing.  Fact Sheet for Patients: EntrepreneurPulse.com.au  Fact Sheet for Healthcare Providers: IncredibleEmployment.be  This test is not yet approved or cleared by the [place] FDA and has been authorized for detection and/or diagnosis of SARS-CoV-2 by FDA under an Emergency Use Authorization (EUA). This EUA will remain in effect (meaning this test can be used) for the duration of the COVID-19 declaration under Section 564(b)(1) of the Act, 21 U.S.C. section 360bbb-3(b)(1), unless the authorization is terminated or revoked.  Performed at [facility], [place] [address] [place], [place] [address]     Procedures and diagnostic studies:  DG Chest 1 View  Result Date: 11/23/2020 CLINICAL DATA:  Concern for osteomyelitis of the RIGHT foot. Recent partial RIGHT foot amputation. Weakness. EXAM: CHEST  1 VIEW COMPARISON:  07/22/2020 FINDINGS: Stable cardiomegaly. Patchy opacity is identified in  the RIGHT lung base, consistent with infectious infiltrate. There is no pulmonary edema. Within the RIGHT UPPER lobe, a 1.5 centimeter spiculated mass and adjacent satellite nodule are again noted. IMPRESSION: 1. RIGHT LOWER lobe infiltrates. 2. RIGHT UPPER lobe masses, as seen on prior exams. Electronically Signed   By:  Nolon Nations M.D.   On: 11/23/2020 14:35   DG Foot Complete Right  Result Date: 11/23/2020 CLINICAL DATA:  Recent partial amputation, concern for osteomyelitis. EXAM: RIGHT FOOT COMPLETE - 3+ VIEW COMPARISON:  08/25/2020 FINDINGS: Prior amputation of the small toe. Recognizable 3.1 cm proximal portion of the fifth metatarsal noted, lung distal margin of this there is a 3.8 cm region of amorphous hazy calcification suspicious for a destroyed bony fragment from osteomyelitis, less likely to be heterotopic ossification along the prior amputation. No appreciable destructive findings along the remaining metatarsals or phalanges. Mildly exaggerated longitudinal arch of the foot on the lateral projection which is not obtained with the patient standing. Extensive soft tissue swelling along the forefoot especially dorsally. Irregularity of the cutaneous surface along the amputation site and lateral to the fifth metatarsal. Small Achilles calcaneal spur. IMPRESSION: 1. 3.8 cm region of amorphous hazy calcification corresponding to the shaft of the fifth metatarsal, probably bony destruction from active osteomyelitis and less likely to from heterotopic ossification along the site of prior amputation. There is a 3.1 cm fragment of proximal fifth metatarsal which appears recognizable, proximal to this hazy calcification. Soft tissue swelling and irregularity of the overlying soft tissues. Electronically Signed   By: Van Clines M.D.   On: 11/23/2020 15:10    Medications:    vitamin C  500 mg Oral Daily   cholecalciferol  1,000 Units Oral Daily   enoxaparin (LOVENOX) injection  40 mg Subcutaneous Z30Q   folic acid  1 mg Oral Daily   insulin aspart  0-5 Units Subcutaneous QHS   insulin aspart  0-9 Units Subcutaneous TID WC   multivitamin with minerals  1 tablet Oral Daily   predniSONE  20 mg Oral Q breakfast   sodium chloride flush  3 mL Intravenous Q12H   thiamine  100 mg Oral Daily   zinc sulfate  220  mg Oral Daily   Continuous Infusions:  sodium chloride     ceFEPime (MAXIPIME) IV 2 g (11/24/20 1117)   vancomycin       LOS: 1 day   Geradine Girt  [facility]   How to contact the [facility] Attending or Consulting provider [place] or covering provider during after hours [place], for this patient?  Check the care team in [facility] and look for a) attending/consulting [facility] provider listed and b) the [facility] team listed Log into [email or web address] and use [place]'s universal password to access. If you do not have the password, please contact the hospital operator. Locate the [facility] provider you are looking for under [facility] and page to a number that you can be directly reached. If you still have difficulty reaching the provider, please page the [facility] (Director on Call) for the Hospitalists listed on amion for assistance.  11/24/2020, 12:24 PM

## 2020-11-24 NOTE — Progress Notes (Signed)
PHARMACY - PHYSICIAN COMMUNICATION CRITICAL VALUE ALERT - BLOOD CULTURE IDENTIFICATION (BCID)  Joel Perez is an 76 y.o. male who presented to [facility] on 11/23/2020 with a chief complaint of weakness and foot infection.   Assessment:  Had completed treatment with cefazolin 3 weeks ago with improvement in his foot until had purulent discharge. Has hx PVD with 5th ray amp in April 2022.   Bcx in 1/6 bottles resulted with GPC in clusters - BCID showing MSSA.   Name of physician (or Provider) Contacted: Dr Nevada Crane  Current antibiotics: Cefepime and vancomycin  Changes to prescribed antibiotics recommended:  Stop vancomycin/cefepime and change to cefazolin 2g IV every 8 hours.   Results for orders placed or performed during the hospital encounter of 11/23/20  Blood Culture ID Panel (Reflexed) (Collected: 11/23/2020  1:58 PM)  Result Value Ref Range   Enterococcus faecalis NOT DETECTED NOT DETECTED   Enterococcus Faecium NOT DETECTED NOT DETECTED   Listeria monocytogenes NOT DETECTED NOT DETECTED   Staphylococcus species DETECTED (A) NOT DETECTED   Staphylococcus aureus (BCID) DETECTED (A) NOT DETECTED   Staphylococcus epidermidis NOT DETECTED NOT DETECTED   Staphylococcus lugdunensis NOT DETECTED NOT DETECTED   Streptococcus species NOT DETECTED NOT DETECTED   Streptococcus agalactiae NOT DETECTED NOT DETECTED   Streptococcus pneumoniae NOT DETECTED NOT DETECTED   Streptococcus pyogenes NOT DETECTED NOT DETECTED   A.calcoaceticus-baumannii NOT DETECTED NOT DETECTED   Bacteroides fragilis NOT DETECTED NOT DETECTED   Enterobacterales NOT DETECTED NOT DETECTED   Enterobacter cloacae complex NOT DETECTED NOT DETECTED   Escherichia coli NOT DETECTED NOT DETECTED   Klebsiella aerogenes NOT DETECTED NOT DETECTED   Klebsiella oxytoca NOT DETECTED NOT DETECTED   Klebsiella pneumoniae NOT DETECTED NOT DETECTED   Proteus species NOT DETECTED NOT DETECTED   Salmonella species NOT DETECTED NOT  DETECTED   Serratia marcescens NOT DETECTED NOT DETECTED   Haemophilus influenzae NOT DETECTED NOT DETECTED   Neisseria meningitidis NOT DETECTED NOT DETECTED   Pseudomonas aeruginosa NOT DETECTED NOT DETECTED   Stenotrophomonas maltophilia NOT DETECTED NOT DETECTED   Candida albicans NOT DETECTED NOT DETECTED   Candida auris NOT DETECTED NOT DETECTED   Candida glabrata NOT DETECTED NOT DETECTED   Candida krusei NOT DETECTED NOT DETECTED   Candida parapsilosis NOT DETECTED NOT DETECTED   Candida tropicalis NOT DETECTED NOT DETECTED   Cryptococcus neoformans/gattii NOT DETECTED NOT DETECTED   Meth resistant mecA/C and MREJ NOT DETECTED NOT DETECTED    Antonietta Jewel, PharmD, BCCCP Clinical Pharmacist  Phone: [phone] 11/24/2020 9:22 PM  Please check AMION for all [place] phone numbers After 10:00 PM, call [place] [phone]

## 2020-11-24 NOTE — Consult Note (Signed)
[place] Nurse wound consult note Consultation was completed by review of records, images and assistance from the bedside nurse/clinical staff.   Reason for Consult: right foot wounds Patient with right  Wound type:Wound on the lateral aspect of the right foot 8 cm x 4 cm x 1 cm depth   Dorsal right foot 2 x 2  Pressure Injury POA: NA Measurement: see  above  Wound bed: Right lateral foot with granulation tissue; hypergranulation present with maceration at wound edges Right dorsal foot with yellow center Drainage (amount, consistency, odor) moderate  Periwound: Charcot changes and is edematous.   Dressing procedure/placement/frequency:  Clean both wounds with saline, pat dry Cut to fit silver Hydrofiber and place over each wound. Top with dry dressings. Change daily, this is topical wound care in the interim of the work up by surgery for further surgical intervention.   Patient has active osteomyelitis per xray, VVS following with MRI, may need further debridements and possible BKA.   Re consult if needed, will not follow at this time. Thanks  Evanee Lubrano [employer], RN,CWOCN, CNS, [place] [phone])

## 2020-11-24 NOTE — Progress Notes (Signed)
Skin assessed with Joel Perez has following skin issues . 1.MASD on both of his buttocks,right worse that left. 2 Unstageable pressure ulcer right lower buttock. 3. An opened surgical wound on right lateral foot. 4. A non pressure wound or arterial ulceration on his anterior food, rounded with yellow-whitish slough,with redness surrounding skin. 5 Left  lower leg  skin redness with arterial shallow ulceration at the left bicep with slight clear drainage ,especially its surrounding area.   Dry and flaky skin on his ble noted.

## 2020-11-24 NOTE — Progress Notes (Signed)
Patient was + for COVID on October 28, 2020-- tested by [facility] home [facility].  Suspect + result is a lingering + from prior infection. Eulogio Bear DO

## 2020-11-25 ENCOUNTER — Other Ambulatory Visit (HOSPITAL_COMMUNITY): Payer: Medicare Other

## 2020-11-25 DIAGNOSIS — D72829 Elevated white blood cell count, unspecified: Secondary | ICD-10-CM | POA: Diagnosis not present

## 2020-11-25 DIAGNOSIS — R7881 Bacteremia: Secondary | ICD-10-CM | POA: Diagnosis not present

## 2020-11-25 DIAGNOSIS — L089 Local infection of the skin and subcutaneous tissue, unspecified: Secondary | ICD-10-CM

## 2020-11-25 DIAGNOSIS — J9611 Chronic respiratory failure with hypoxia: Secondary | ICD-10-CM | POA: Diagnosis not present

## 2020-11-25 DIAGNOSIS — R918 Other nonspecific abnormal finding of lung field: Secondary | ICD-10-CM

## 2020-11-25 DIAGNOSIS — B9561 Methicillin susceptible Staphylococcus aureus infection as the cause of diseases classified elsewhere: Secondary | ICD-10-CM

## 2020-11-25 DIAGNOSIS — M86171 Other acute osteomyelitis, right ankle and foot: Secondary | ICD-10-CM

## 2020-11-25 DIAGNOSIS — M86172 Other acute osteomyelitis, left ankle and foot: Secondary | ICD-10-CM | POA: Diagnosis not present

## 2020-11-25 LAB — CBC WITH DIFFERENTIAL/PLATELET
Abs Immature Granulocytes: 0.97 10*3/uL — ABNORMAL HIGH (ref 0.00–0.07)
Basophils Absolute: 0.1 10*3/uL (ref 0.0–0.1)
Basophils Relative: 0 %
Eosinophils Absolute: 0.2 10*3/uL (ref 0.0–0.5)
Eosinophils Relative: 1 %
HCT: 32.2 % — ABNORMAL LOW (ref 39.0–52.0)
Hemoglobin: 10.5 g/dL — ABNORMAL LOW (ref 13.0–17.0)
Immature Granulocytes: 5 %
Lymphocytes Relative: 11 %
Lymphs Abs: 2.1 10*3/uL (ref 0.7–4.0)
MCH: 27.4 pg (ref 26.0–34.0)
MCHC: 32.6 g/dL (ref 30.0–36.0)
MCV: 84.1 fL (ref 80.0–100.0)
Monocytes Absolute: 1 10*3/uL (ref 0.1–1.0)
Monocytes Relative: 5 %
Neutro Abs: 14.5 10*3/uL — ABNORMAL HIGH (ref 1.7–7.7)
Neutrophils Relative %: 78 %
Platelets: 500 10*3/uL — ABNORMAL HIGH (ref 150–400)
RBC: 3.83 MIL/uL — ABNORMAL LOW (ref 4.22–5.81)
RDW: 16.5 % — ABNORMAL HIGH (ref 11.5–15.5)
WBC: 18.8 10*3/uL — ABNORMAL HIGH (ref 4.0–10.5)
nRBC: 0.1 % (ref 0.0–0.2)

## 2020-11-25 LAB — C-REACTIVE PROTEIN: CRP: 18.5 mg/dL — ABNORMAL HIGH (ref <1.0)

## 2020-11-25 LAB — COMPREHENSIVE METABOLIC PANEL
ALT: 25 U/L (ref 0–44)
AST: 33 U/L (ref 15–41)
Albumin: 1.7 g/dL — ABNORMAL LOW (ref 3.5–5.0)
Alkaline Phosphatase: 62 U/L (ref 38–126)
Anion gap: 5 (ref 5–15)
BUN: 22 mg/dL (ref 8–23)
CO2: 29 mmol/L (ref 22–32)
Calcium: 8 mg/dL — ABNORMAL LOW (ref 8.9–10.3)
Chloride: 102 mmol/L (ref 98–111)
Creatinine, Ser: 0.83 mg/dL (ref 0.61–1.24)
GFR, Estimated: >60 mL/min (ref >60)
Glucose, Bld: 109 mg/dL — ABNORMAL HIGH (ref 70–99)
Potassium: 3.6 mmol/L (ref 3.5–5.1)
Sodium: 136 mmol/L (ref 135–145)
Total Bilirubin: 0.6 mg/dL (ref 0.3–1.2)
Total Protein: 4.8 g/dL — ABNORMAL LOW (ref 6.5–8.1)

## 2020-11-25 LAB — GLUCOSE, CAPILLARY
Glucose-Capillary: 107 mg/dL — ABNORMAL HIGH (ref 70–99)
Glucose-Capillary: 115 mg/dL — ABNORMAL HIGH (ref 70–99)
Glucose-Capillary: 162 mg/dL — ABNORMAL HIGH (ref 70–99)
Glucose-Capillary: 105 mg/dL — ABNORMAL HIGH (ref 70–99)
Glucose-Capillary: 117 mg/dL — ABNORMAL HIGH (ref 70–99)

## 2020-11-25 MED ORDER — OXYCODONE HCL 5 MG PO TABS
10.0000 mg | ORAL_TABLET | Freq: Four times a day (QID) | ORAL | Status: DC | PRN
  Administered 2020-11-25 – 2020-11-29 (×11): 10 mg via ORAL
  Filled 2020-11-25 (×12): qty 2

## 2020-11-25 MED ORDER — LOPERAMIDE HCL 2 MG PO CAPS: 2.0000 mg | ORAL_CAPSULE | ORAL | Status: DC | PRN

## 2020-11-25 MED ORDER — ADULT MULTIVITAMIN W/MINERALS CH
1.0000 | ORAL_TABLET | Freq: Every day | ORAL | Status: DC
  Administered 2020-11-26 – 2020-12-09 (×13): 1 via ORAL
  Filled 2020-11-25 (×14): qty 1

## 2020-11-25 MED ORDER — SACCHAROMYCES BOULARDII 250 MG PO CAPS
250.0000 mg | ORAL_CAPSULE | Freq: Two times a day (BID) | ORAL | Status: DC
  Administered 2020-11-25 – 2020-12-09 (×28): 250 mg via ORAL
  Filled 2020-11-25 (×29): qty 1

## 2020-11-25 MED ORDER — MELATONIN 5 MG PO TABS
10.0000 mg | ORAL_TABLET | Freq: Every day | ORAL | Status: DC
  Administered 2020-11-25 – 2020-12-08 (×14): 10 mg via ORAL
  Filled 2020-11-25 (×14): qty 2

## 2020-11-25 MED ORDER — CYCLOBENZAPRINE HCL 10 MG PO TABS
10.0000 mg | ORAL_TABLET | Freq: Every day | ORAL | Status: DC
  Administered 2020-11-25 – 2020-12-08 (×14): 10 mg via ORAL
  Filled 2020-11-25 (×14): qty 1

## 2020-11-25 MED ORDER — MELATONIN 3 MG PO TABS: 1.5000 mg | ORAL_TABLET | Freq: Every day | ORAL | Status: DC

## 2020-11-25 NOTE — Consult Note (Signed)
[facility] for Infectious Disease    Date of Admission:  11/23/2020     Total days of antibiotics 3               Reason for Consult: MSSA Bacteremia    Referring Provider: Tivis Ringer / [employer] Primary Care Provider: [name], MD   ASSESSMENT:  Joel Perez has active osteomyelitis of the residual 5th metatarsal complicated by abscess and MSSA bacteremia. Vascular Surgery with plans for BKA as foot does not appears salvageable. Check TTE given MSSA bacteremia to rule out endocarditis. Recheck blood cultures to determine clearance of bacteremia. Hold central line placement until blood cultures are cleared. Wound care per Vascular surgery recommendations pending surgery. Duration of antibiotics to be determined. Does not appear to be pneumonia at this point and previously diagnosed with Covid in early June. Discussed importance of optimizing protein intake to aid in healing.  PLAN:  Continue Cefazolin. Repeat blood cultures to ensure clearance of bacteremia TTE to check for endocarditis Tentatively scheduled for BKA on Friday per Vascular Surgery. Optimize nutritional intake to aid in healing.    Active Problems:   Neuropathy, alcoholic (HCC)   Chronic respiratory failure with hypoxia (HCC)   PVD (peripheral vascular disease) (HCC)   Multiple pulmonary nodules determined by computed tomography of lung   Osteomyelitis of ankle or foot, acute, left (HCC)   RLL pneumonia    (feeding supplement) PROSource Plus  30 mL Oral BID BM   vitamin C  500 mg Oral Daily   cholecalciferol  1,000 Units Oral Daily   enoxaparin (LOVENOX) injection  40 mg Subcutaneous Q24H   feeding supplement  237 mL Oral TID BM   folic acid  1 mg Oral Daily   insulin aspart  0-5 Units Subcutaneous QHS   insulin aspart  0-9 Units Subcutaneous TID WC   multivitamin with minerals  1 tablet Oral Daily   predniSONE  20 mg Oral Q breakfast   sodium chloride flush  3 mL Intravenous Q12H   thiamine  100  mg Oral Daily   zinc sulfate  220 mg Oral Daily     HPI: Joel Perez is a 75 y.o. male with previous medical history of chronic diastolic heart failure, LBBB with IVCD, chronic venous insufficiency with statis dermatitis, PAD and COPD on chronic O2 at 4L admitted with worsening right lateral foot wound.   Joel Perez was last seen by the ID service on 5/25 with continued healing of his right foot having completed 6 weeks of Ancef on 5/4 for osteomyelitis complicated by MSSA bacteremia. Joel Perez had been progressing adequately until 6/30 when the wound had increased amounts of drainage and appeared worse. Denies fevers/chills. Home Health RN recommended that Joel Perez go directly to the ED.   Joel Perez has had progressive weakness and has been unable to get up out of his chair over the past 3 to 4 days. Symptoms of weakness have continued to worsen. In the ED wound noted to have foul smelling drainage with x-rays consistent with active osteomyelitis. MRI right foot with extensive and advanced osteomyelitis of the residual 5th metatarsal and 4.5 cm abscess. Blood cultures are positive for MSSA bacteremia.  Afebrile since admission with leukocytosis ranging from 18.8-22.9. Day 3 of antimicrobial therapy. Has received 2 doses of vancomycin and cefepime and now on Cefazolin. Vascular Surgery evaluated and recommending BKA.    Review of Systems: Review of Systems  Constitutional:  Positive for malaise/fatigue. Negative for chills,  fever and weight loss.  Respiratory:  Negative for cough, shortness of breath and wheezing.   Cardiovascular:  Negative for chest pain and leg swelling.  Gastrointestinal:  Negative for abdominal pain, constipation, diarrhea, nausea and vomiting.  Skin:  Negative for rash.  Neurological:  Positive for weakness.    Past Medical History:  Diagnosis Date   Blind right eye 1985   Following a work accident   BPH (benign prostatic hyperplasia)    Capsular cataract of left eye    COPD  (chronic obstructive pulmonary disease) ([place])    Dr. [name]   Current smoker    Long-term smoker. Not interested in quitting   Diverticulosis    With intermittent diverticulitis   Erectile dysfunction    Essential hypertension    Several recorded blood pressures greater than 140/90.   H/O mitral valve prolapse    By report, but not confirmed by echo.   Low back pain    Chronic. Followed by Dr. Suella Broad   Mild aortic regurgitation 12/2013   Mild to moderate aortic regurgitation on echo   Neuropathy     Social History   Tobacco Use   Smoking status: Former    Packs/day: 2.00    Years: 50.00    Pack years: 100.00    Types: Cigarettes    Quit date: 03/16/2018    Years since quitting: 2.6   Smokeless tobacco: Never  Vaping Use   Vaping Use: Some days   Start date: 03/16/2018  Substance Use Topics   Alcohol use: Yes    Alcohol/week: 0.0 standard drinks   Drug use: No    Family History  Problem Relation Age of Onset   Heart disease Father    Breast cancer Mother    Other Sister    Brain cancer Brother     Allergies  Allergen Reactions   Other Swelling and Other (See Comments)    Farmed Fish (tightness in throat & lip swelling)   Codeine Rash   Sulfa Antibiotics Hives    OBJECTIVE: Blood pressure 133/61, pulse 98, temperature 97.9 F (36.6 C), temperature source Oral, resp. rate 18, weight 103.3 kg, SpO2 93 %.  Physical Exam Constitutional:      General: Joel Perez is not in acute distress.    Appearance: Joel Perez is well-developed.     Comments: Lying in bed with head of bed elevated.   Cardiovascular:     Rate and Rhythm: Normal rate and regular rhythm.     Heart sounds: Normal heart sounds.  Pulmonary:     Effort: Pulmonary effort is normal.     Breath sounds: Normal breath sounds.  Musculoskeletal:     Comments: Right lateral foot with moderate amount of purulent drainage.  Skin:    General: Skin is warm and dry.  Neurological:     Mental Status: Joel Perez is alert  and oriented to person, place, and time.  Psychiatric:        Mood and Affect: Mood normal.    Lab Results Lab Results  Component Value Date   WBC 18.8 (H) 11/25/2020   HGB 10.5 (L) 11/25/2020   HCT 32.2 (L) 11/25/2020   MCV 84.1 11/25/2020   PLT 500 (H) 11/25/2020    Lab Results  Component Value Date   CREATININE 0.83 11/25/2020   BUN 22 11/25/2020   NA 136 11/25/2020   K 3.6 11/25/2020   CL 102 11/25/2020   CO2 29 11/25/2020    Lab Results  Component Value  Date   ALT 25 11/25/2020   AST 33 11/25/2020   ALKPHOS 62 11/25/2020   BILITOT 0.6 11/25/2020     Microbiology: Recent Results (from the past 240 hour(s))  Blood culture (routine single)     Status: Abnormal (Preliminary result)   Collection Time: 11/23/20  1:58 PM   Specimen: BLOOD RIGHT HAND  Result Value Ref Range Status   Specimen Description BLOOD RIGHT HAND  Final   Special Requests   Final    BOTTLES DRAWN AEROBIC AND ANAEROBIC Blood Culture adequate volume   Culture  Setup Time   Final    GRAM POSITIVE COCCI IN CLUSTERS AEROBIC BOTTLE ONLY CRITICAL RESULT CALLED TO, READ BACK BY AND VERIFIED WITH: Lanae Boast PHARMD 2011 11/24/20 A BROWNING    Culture (A)  Final    STAPHYLOCOCCUS AUREUS SUSCEPTIBILITIES TO FOLLOW Performed at [facility], [place] [address] [place], [place] [address]    Report Status PENDING  Incomplete  Blood Culture ID Panel (Reflexed)     Status: Abnormal   Collection Time: 11/23/20  1:58 PM  Result Value Ref Range Status   Enterococcus faecalis NOT DETECTED NOT DETECTED Final   Enterococcus Faecium NOT DETECTED NOT DETECTED Final   Listeria monocytogenes NOT DETECTED NOT DETECTED Final   Staphylococcus species DETECTED (A) NOT DETECTED Final    Comment: CRITICAL RESULT CALLED TO, READ BACK BY AND VERIFIED WITH: K HURTH PHARMD 2011 11/24/20 A BROWNING    Staphylococcus aureus (BCID) DETECTED (A) NOT DETECTED Final    Comment: CRITICAL RESULT CALLED TO, READ BACK BY AND VERIFIED  WITH: Lanae Boast PHARMD 2011 11/24/20 A BROWNING    Staphylococcus epidermidis NOT DETECTED NOT DETECTED Final   Staphylococcus lugdunensis NOT DETECTED NOT DETECTED Final   Streptococcus species NOT DETECTED NOT DETECTED Final   Streptococcus agalactiae NOT DETECTED NOT DETECTED Final   Streptococcus pneumoniae NOT DETECTED NOT DETECTED Final   Streptococcus pyogenes NOT DETECTED NOT DETECTED Final   A.calcoaceticus-baumannii NOT DETECTED NOT DETECTED Final   Bacteroides fragilis NOT DETECTED NOT DETECTED Final   Enterobacterales NOT DETECTED NOT DETECTED Final   Enterobacter cloacae complex NOT DETECTED NOT DETECTED Final   Escherichia coli NOT DETECTED NOT DETECTED Final   Klebsiella aerogenes NOT DETECTED NOT DETECTED Final   Klebsiella oxytoca NOT DETECTED NOT DETECTED Final   Klebsiella pneumoniae NOT DETECTED NOT DETECTED Final   Proteus species NOT DETECTED NOT DETECTED Final   Salmonella species NOT DETECTED NOT DETECTED Final   Serratia marcescens NOT DETECTED NOT DETECTED Final   Haemophilus influenzae NOT DETECTED NOT DETECTED Final   Neisseria meningitidis NOT DETECTED NOT DETECTED Final   Pseudomonas aeruginosa NOT DETECTED NOT DETECTED Final   Stenotrophomonas maltophilia NOT DETECTED NOT DETECTED Final   Candida albicans NOT DETECTED NOT DETECTED Final   Candida auris NOT DETECTED NOT DETECTED Final   Candida glabrata NOT DETECTED NOT DETECTED Final   Candida krusei NOT DETECTED NOT DETECTED Final   Candida parapsilosis NOT DETECTED NOT DETECTED Final   Candida tropicalis NOT DETECTED NOT DETECTED Final   Cryptococcus neoformans/gattii NOT DETECTED NOT DETECTED Final   Meth resistant mecA/C and MREJ NOT DETECTED NOT DETECTED Final    Comment: Performed at [facility], [address] [place], [place] [address]  Culture, blood (routine x 2)     Status: None (Preliminary result)   Collection Time: 11/23/20  3:39 PM   Specimen: BLOOD  Result Value Ref Range Status    Specimen  Description BLOOD SITE NOT SPECIFIED  Final   Special Requests   Final    BOTTLES DRAWN AEROBIC AND ANAEROBIC Blood Culture results may not be optimal due to an excessive volume of blood received in culture bottles   Culture   Final    NO GROWTH 2 DAYS Performed at [facility], [place] [address] [place], [place] [address]    Report Status PENDING  Incomplete  Culture, blood (routine x 2)     Status: None (Preliminary result)   Collection Time: 11/23/20  3:57 PM   Specimen: BLOOD  Result Value Ref Range Status   Specimen Description BLOOD SITE NOT SPECIFIED  Final   Special Requests   Final    BOTTLES DRAWN AEROBIC AND ANAEROBIC Blood Culture results may not be optimal due to an inadequate volume of blood received in culture bottles   Culture   Final    NO GROWTH 2 DAYS Performed at [facility], [place] [address] [place], [place] [address]    Report Status PENDING  Incomplete  Resp Panel by RT-PCR (Flu A&B, Covid) Nasopharyngeal Swab     Status: Abnormal   Collection Time: 11/23/20  7:27 PM   Specimen: Nasopharyngeal Swab; Nasopharyngeal(NP) swabs in vial transport medium  Result Value Ref Range Status   SARS Coronavirus 2 by RT PCR POSITIVE (A) NEGATIVE Final    Comment: RESULT CALLED TO, READ BACK BY AND VERIFIED WITH: RN BOKIAGON C. BY MESSAN H. AT 2055 ON 11/23/2020 (NOTE) SARS-CoV-2 target nucleic acids are DETECTED.  The SARS-CoV-2 RNA is generally detectable in upper respiratory specimens during the acute phase of infection. Positive results are indicative of the presence of the identified virus, but do not rule out bacterial infection or co-infection with other pathogens not detected by the test. Clinical correlation with patient history and other diagnostic information is necessary to determine patient infection status. The expected result is Negative.  Fact Sheet for Patients: EntrepreneurPulse.com.au  Fact Sheet for Healthcare  Providers: IncredibleEmployment.be  This test is not yet approved or cleared by the [place] FDA and  has been authorized for detection and/or diagnosis of SARS-CoV-2 by FDA under an Emergency Use Authorization (EUA).  This EUA will remain in effect (meaning th is test can be used) for the duration of  the COVID-19 declaration under Section 564(b)(1) of the Act, 21 U.S.C. section 360bbb-3(b)(1), unless the authorization is terminated or revoked sooner.     Influenza A by PCR NEGATIVE NEGATIVE Final   Influenza B by PCR NEGATIVE NEGATIVE Final    Comment: (NOTE) The Xpert Xpress SARS-CoV-2/FLU/RSV plus assay is intended as an aid in the diagnosis of influenza from Nasopharyngeal swab specimens and should not be used as a sole basis for treatment. Nasal washings and aspirates are unacceptable for Xpert Xpress SARS-CoV-2/FLU/RSV testing.  Fact Sheet for Patients: EntrepreneurPulse.com.au  Fact Sheet for Healthcare Providers: IncredibleEmployment.be  This test is not yet approved or cleared by the [place] FDA and has been authorized for detection and/or diagnosis of SARS-CoV-2 by FDA under an Emergency Use Authorization (EUA). This EUA will remain in effect (meaning this test can be used) for the duration of the COVID-19 declaration under Section 564(b)(1) of the Act, 21 U.S.C. section 360bbb-3(b)(1), unless the authorization is terminated or revoked.  Performed at [facility], [place] [address] [place], [place] [address]      Terri Piedra, [place] [facility] Infectious [facility]  11/25/2020  11:37 AM

## 2020-11-25 NOTE — Progress Notes (Signed)
Progress Note    CHIN WACHTER  ZDG:[id number] DOB: 16-Feb-1946  DOA: 11/23/2020 PCP: [name], MD    Brief Narrative:     Medical records reviewed and are as summarized below:  [name] is an 75 y.o. male with PMH significant for PVD s/p  fifth ray amputation 08/28/2020 by vascular surgery Dr. Stanford Breed, chronic hypoxic respiratory failure on 4 L home O2 with recent admission for severe sepsis secondary to RLL pneumonia, known abnormal mass on chest CT followed by Dr. Leonides Schanz as an outpatient, HFpEF was told to come in 2 days ago by his home health nurse because his foot looked bad.  Patient had completed outpatient course of cefazolin 3 weeks prior and his foot was apparently healing well until 2 days ago when his nurse apparently noted purulent discharge.  Recent COVID 19 infection per home health (June 2022).   Assessment/Plan:   Active Problems:   Neuropathy, alcoholic (HCC)   Chronic respiratory failure with hypoxia (HCC)   PVD (peripheral vascular disease) (HCC)   Multiple pulmonary nodules determined by computed tomography of lung   Osteomyelitis of ankle or foot, acute, left (HCC)   RLL pneumonia  MSSA bacteremia -IV abx -echo -ID consult  Osteomyelitis after right fifth ray amputation inpatient with PVD and venous insufficiency Patient with recurrent osteomyelitis after right fifth ray amputation 08/28/2020 by Dr. Stanford Breed Patient is 3 weeks out of outpatient course of abx for MSSA and strep Vascular consult: Potentially patient may need further debridement of his right foot later in the week.  We will need to have his lungs improved a little bit prior to this.  In light of the patient's multiple medical problems of fairly severe nature it may be worthwhile to get a palliative care consult. -MRI: Extensive and advanced changes of osteomyelitis involving the residual fifth metatarsal. 2. Adjacent 4.5 cm fluid collection highly suspicious for an abscess. 3. Severe  diffuse cellulitis and myofasciitis.   Right lower lobe infiltrate Patient was discharged 07/26/2020 after severe sepsis secondary to RLL pneumonia Patient has recurrent/persistent RLL infiltrate He has chronic shortness of breath with extremely limited exercise tolerance. It is unclear to me if this is a new possibly postobstructive pneumonia or something else. He is afebrile but does have leukocytosis which may be secondary to the osteomyelitis   Right upper lobe nodule 1.5 cm spiculated mass seen on previous CT March 2022 Patient is followed by Dr. [name], pulmonologist Per his note from 08/13/2020: "Given severity of his lung dz no options for bx or rx at this point - will arrange f/u CT at next ov depending on his clinical condition" Patient himself says he has no recollection of being told that he has masses in his lungs.   Chronic hypoxic respiratory failure secondary to COPD on baseline 4 L oxygen Patient has been on 20 to 40 mg of prednisone for several weeks per his report Continue home oxygen   Recent COVID infection Managed by Dr. [name] -repeat positive but > 10 days ago so will not treat    Pressure Injury 07/22/20 Buttocks Left Stage 2 -  Partial thickness loss of dermis presenting as a shallow open injury with a red, pink wound bed without slough. (Active)  07/22/20 1600  Location: Buttocks  Location Orientation: Left  Staging: Stage 2 -  Partial thickness loss of dermis presenting as a shallow open injury with a red, pink wound bed without slough.  Wound Description (Comments):   Present  on Admission: Yes     Pressure Injury 07/23/20 Heel Right Stage 3 -  Full thickness tissue loss. Subcutaneous fat may be visible but bone, tendon or muscle are NOT exposed. (Active)  07/23/20 0800  Location: Heel  Location Orientation: Right  Staging: Stage 3 -  Full thickness tissue loss. Subcutaneous fat may be visible but bone, tendon or muscle are NOT exposed.  Wound Description  (Comments):   Present on Admission: Yes     Pressure Injury 08/26/20 Thigh Posterior;Proximal;Right;Upper Stage 2 -  Partial thickness loss of dermis presenting as a shallow open injury with a red, pink wound bed without slough. Skin Tear just below Right Buttocks (Active)  08/26/20 1959  Location: Thigh  Location Orientation: Posterior;Proximal;Right;Upper  Staging: Stage 2 -  Partial thickness loss of dermis presenting as a shallow open injury with a red, pink wound bed without slough.  Wound Description (Comments): Skin Tear just below Right Buttocks  Present on Admission:      Pressure Injury 11/23/20 Buttocks Right;Lower Stage 3 -  Full thickness tissue loss. Subcutaneous fat may be visible but bone, tendon or muscle are NOT exposed. Stage 3 with inner center deep tissue pressure injury (Active)  11/23/20   Location: Buttocks  Location Orientation: Right;Lower  Staging: Stage 3 -  Full thickness tissue loss. Subcutaneous fat may be visible but bone, tendon or muscle are NOT exposed.  Wound Description (Comments): Stage 3 with inner center deep tissue pressure injury  Present on Admission: Yes   Patient has been sitting in a recliner for the past 3 to 4 days and has been stooling on himself which his wife has been attempting to clean.    HFpEF Restart home meds    HTN Restart home meds    Hypokalemia -repelte  obesity Body mass index is 32.68 kg/m.   Family Communication/Anticipated D/C date and plan/Code Status   DVT prophylaxis: Lovenox ordered. Code Status: Full Code.  Family Communication: wife at bedside 7/4 Disposition Plan: Status is: Inpatient  Remains inpatient appropriate because:Inpatient level of care appropriate due to severity of illness  Dispo: The patient is from: Home              Anticipated d/c is to:  tbd              Patient currently is not medically stable to d/c.   Difficult to place patient No         Medical Consultants:    Vascular WOC Will probably need palliative care  Subjective:   Still with pain  Objective:    Vitals:   11/24/20 1640 11/24/20 2049 11/25/20 0434 11/25/20 0931  BP: (!) 106/52 115/70 126/73 133/61  Pulse: 93 88 87 98  Resp: 19 18 20 18   Temp: 97.6 F (36.4 C) 97.7 F (36.5 C) 97.6 F (36.4 C) 97.9 F (36.6 C)  TempSrc: Oral Oral Oral Oral  SpO2: 97% 98% 98% 93%  Weight:        Intake/Output Summary (Last 24 hours) at 11/25/2020 1250 Last data filed at 11/25/2020 0900 Gross per 24 hour  Intake 2201.08 ml  Output 1050 ml  Net 1151.08 ml   Filed Weights   11/23/20 1524  Weight: 103.3 kg    Exam:  General: Appearance:    Obese male ill appearing     Lungs:     respirations unlabored  Heart:    Normal heart rate.      Neurologic:   Awake, alert  Data Reviewed:   I have personally reviewed following labs and imaging studies:  Labs: Labs show the following:   Basic Metabolic Panel: Recent Labs  Lab 11/23/20 1358 11/24/20 0406 11/25/20 0308  NA 132* 134* 136  K 3.6 3.1* 3.6  CL 94* 101 102  CO2 27 27 29   GLUCOSE 112* 96 109*  BUN 34* 23 22  CREATININE 1.17 0.87 0.83  CALCIUM 8.5* 7.6* 8.0*  MG  --  1.9  --   PHOS  --  3.1  --    GFR Estimated Creatinine Clearance: 89.7 mL/min (by C-G formula based on SCr of 0.83 mg/dL). Liver Function Tests: Recent Labs  Lab 11/23/20 1358 11/24/20 0406 11/25/20 0308  AST 35 28 33  ALT 26 21 25   ALKPHOS 70 59 62  BILITOT 1.1 1.0 0.6  PROT 6.0* 4.6* 4.8*  ALBUMIN 2.5* 1.8* 1.7*   No results for input(s): LIPASE, AMYLASE in the last 168 hours. No results for input(s): AMMONIA in the last 168 hours. Coagulation profile Recent Labs  Lab 11/23/20 1358  INR 1.0    CBC: Recent Labs  Lab 11/23/20 1358 11/24/20 0406 11/25/20 0308  WBC 25.0* 22.9* 18.8*  NEUTROABS 19.2* 18.2* 14.5*  HGB 11.9* 10.6* 10.5*  HCT 37.4* 32.6* 32.2*  MCV 85.8 83.8 84.1  PLT 570* 459* 500*   Cardiac Enzymes: No  results for input(s): CKTOTAL, CKMB, CKMBINDEX, TROPONINI in the last 168 hours. BNP (last 3 results) No results for input(s): PROBNP in the last 8760 hours. CBG: Recent Labs  Lab 11/24/20 1202 11/24/20 1641 11/24/20 2047 11/25/20 0642 11/25/20 1147  GLUCAP 72 157* 135* 107* 115*   D-Dimer: Recent Labs    11/24/20 0406  DDIMER 16.63*   Hgb A1c: Recent Labs    11/23/20 1700  HGBA1C 6.5*   Lipid Profile: No results for input(s): CHOL, HDL, LDLCALC, TRIG, CHOLHDL, LDLDIRECT in the last 72 hours. Thyroid function studies: No results for input(s): TSH, T4TOTAL, T3FREE, THYROIDAB in the last 72 hours.  Invalid input(s): FREET3 Anemia work up: Recent Labs    11/24/20 0406  FERRITIN 291   Sepsis Labs: Recent Labs  Lab 11/23/20 1358 11/23/20 1538 11/24/20 0406 11/25/20 0308  PROCALCITON  --   --  0.29  --   WBC 25.0*  --  22.9* 18.8*  LATICACIDVEN 2.8* 2.2*  --   --     Microbiology Recent Results (from the past 240 hour(s))  Blood culture (routine single)     Status: Abnormal (Preliminary result)   Collection Time: 11/23/20  1:58 PM   Specimen: BLOOD RIGHT HAND  Result Value Ref Range Status   Specimen Description BLOOD RIGHT HAND  Final   Special Requests   Final    BOTTLES DRAWN AEROBIC AND ANAEROBIC Blood Culture adequate volume   Culture  Setup Time   Final    GRAM POSITIVE COCCI IN CLUSTERS AEROBIC BOTTLE ONLY CRITICAL RESULT CALLED TO, READ BACK BY AND VERIFIED WITH: Lanae Boast PHARMD 2011 11/24/20 A BROWNING    Culture (A)  Final    STAPHYLOCOCCUS AUREUS SUSCEPTIBILITIES TO FOLLOW Performed at [facility], [place] [address] [place], [place] [address]    Report Status PENDING  Incomplete  Blood Culture ID Panel (Reflexed)     Status: Abnormal   Collection Time: 11/23/20  1:58 PM  Result Value Ref Range Status   Enterococcus faecalis NOT DETECTED NOT DETECTED Final   Enterococcus Faecium NOT DETECTED NOT DETECTED Final  Listeria monocytogenes  NOT DETECTED NOT DETECTED Final   Staphylococcus species DETECTED (A) NOT DETECTED Final    Comment: CRITICAL RESULT CALLED TO, READ BACK BY AND VERIFIED WITH: K HURTH PHARMD 2011 11/24/20 A BROWNING    Staphylococcus aureus (BCID) DETECTED (A) NOT DETECTED Final    Comment: CRITICAL RESULT CALLED TO, READ BACK BY AND VERIFIED WITH: Lanae Boast PHARMD 2011 11/24/20 A BROWNING    Staphylococcus epidermidis NOT DETECTED NOT DETECTED Final   Staphylococcus lugdunensis NOT DETECTED NOT DETECTED Final   Streptococcus species NOT DETECTED NOT DETECTED Final   Streptococcus agalactiae NOT DETECTED NOT DETECTED Final   Streptococcus pneumoniae NOT DETECTED NOT DETECTED Final   Streptococcus pyogenes NOT DETECTED NOT DETECTED Final   A.calcoaceticus-baumannii NOT DETECTED NOT DETECTED Final   Bacteroides fragilis NOT DETECTED NOT DETECTED Final   Enterobacterales NOT DETECTED NOT DETECTED Final   Enterobacter cloacae complex NOT DETECTED NOT DETECTED Final   Escherichia coli NOT DETECTED NOT DETECTED Final   Klebsiella aerogenes NOT DETECTED NOT DETECTED Final   Klebsiella oxytoca NOT DETECTED NOT DETECTED Final   Klebsiella pneumoniae NOT DETECTED NOT DETECTED Final   Proteus species NOT DETECTED NOT DETECTED Final   Salmonella species NOT DETECTED NOT DETECTED Final   Serratia marcescens NOT DETECTED NOT DETECTED Final   Haemophilus influenzae NOT DETECTED NOT DETECTED Final   Neisseria meningitidis NOT DETECTED NOT DETECTED Final   Pseudomonas aeruginosa NOT DETECTED NOT DETECTED Final   Stenotrophomonas maltophilia NOT DETECTED NOT DETECTED Final   Candida albicans NOT DETECTED NOT DETECTED Final   Candida auris NOT DETECTED NOT DETECTED Final   Candida glabrata NOT DETECTED NOT DETECTED Final   Candida krusei NOT DETECTED NOT DETECTED Final   Candida parapsilosis NOT DETECTED NOT DETECTED Final   Candida tropicalis NOT DETECTED NOT DETECTED Final   Cryptococcus neoformans/gattii NOT DETECTED  NOT DETECTED Final   Meth resistant mecA/C and MREJ NOT DETECTED NOT DETECTED Final    Comment: Performed at [facility], [address] [place], [place] [address]  Culture, blood (routine x 2)     Status: None (Preliminary result)   Collection Time: 11/23/20  3:39 PM   Specimen: BLOOD  Result Value Ref Range Status   Specimen Description BLOOD SITE NOT SPECIFIED  Final   Special Requests   Final    BOTTLES DRAWN AEROBIC AND ANAEROBIC Blood Culture results may not be optimal due to an excessive volume of blood received in culture bottles   Culture   Final    NO GROWTH 2 DAYS Performed at [facility], [place] [address], [place], [place] [address]    Report Status PENDING  Incomplete  Culture, blood (routine x 2)     Status: None (Preliminary result)   Collection Time: 11/23/20  3:57 PM   Specimen: BLOOD  Result Value Ref Range Status   Specimen Description BLOOD SITE NOT SPECIFIED  Final   Special Requests   Final    BOTTLES DRAWN AEROBIC AND ANAEROBIC Blood Culture results may not be optimal due to an inadequate volume of blood received in culture bottles   Culture   Final    NO GROWTH 2 DAYS Performed at [facility], [place] [address], [place], [place] [address]    Report Status PENDING  Incomplete  Resp Panel by RT-PCR (Flu A&B, Covid) Nasopharyngeal Swab     Status: Abnormal   Collection Time: 11/23/20  7:27 PM   Specimen: Nasopharyngeal Swab; Nasopharyngeal(NP)  swabs in vial transport medium  Result Value Ref Range Status   SARS Coronavirus 2 by RT PCR POSITIVE (A) NEGATIVE Final    Comment: RESULT CALLED TO, READ BACK BY AND VERIFIED WITH: RN BOKIAGON C. BY MESSAN H. AT 2055 ON 11/23/2020 (NOTE) SARS-CoV-2 target nucleic acids are DETECTED.  The SARS-CoV-2 RNA is generally detectable in upper respiratory specimens during the acute phase of infection. Positive results are indicative of the presence of the identified virus, but do not rule out bacterial  infection or co-infection with other pathogens not detected by the test. Clinical correlation with patient history and other diagnostic information is necessary to determine patient infection status. The expected result is Negative.  Fact Sheet for Patients: EntrepreneurPulse.com.au  Fact Sheet for Healthcare Providers: IncredibleEmployment.be  This test is not yet approved or cleared by the [place] FDA and  has been authorized for detection and/or diagnosis of SARS-CoV-2 by FDA under an Emergency Use Authorization (EUA).  This EUA will remain in effect (meaning th is test can be used) for the duration of  the COVID-19 declaration under Section 564(b)(1) of the Act, 21 U.S.C. section 360bbb-3(b)(1), unless the authorization is terminated or revoked sooner.     Influenza A by PCR NEGATIVE NEGATIVE Final   Influenza B by PCR NEGATIVE NEGATIVE Final    Comment: (NOTE) The Xpert Xpress SARS-CoV-2/FLU/RSV plus assay is intended as an aid in the diagnosis of influenza from Nasopharyngeal swab specimens and should not be used as a sole basis for treatment. Nasal washings and aspirates are unacceptable for Xpert Xpress SARS-CoV-2/FLU/RSV testing.  Fact Sheet for Patients: EntrepreneurPulse.com.au  Fact Sheet for Healthcare Providers: IncredibleEmployment.be  This test is not yet approved or cleared by the [place] FDA and has been authorized for detection and/or diagnosis of SARS-CoV-2 by FDA under an Emergency Use Authorization (EUA). This EUA will remain in effect (meaning this test can be used) for the duration of the COVID-19 declaration under Section 564(b)(1) of the Act, 21 U.S.C. section 360bbb-3(b)(1), unless the authorization is terminated or revoked.  Performed at [facility], [place] [address] [place], [place] [address]     Procedures and diagnostic studies:  DG Chest 1  View  Result Date: 11/23/2020 CLINICAL DATA:  Concern for osteomyelitis of the RIGHT foot. Recent partial RIGHT foot amputation. Weakness. EXAM: CHEST  1 VIEW COMPARISON:  07/22/2020 FINDINGS: Stable cardiomegaly. Patchy opacity is identified in the RIGHT lung base, consistent with infectious infiltrate. There is no pulmonary edema. Within the RIGHT UPPER lobe, a 1.5 centimeter spiculated mass and adjacent satellite nodule are again noted. IMPRESSION: 1. RIGHT LOWER lobe infiltrates. 2. RIGHT UPPER lobe masses, as seen on prior exams. Electronically Signed   By: Nolon Nations M.D.   On: 11/23/2020 14:35   MR FOOT RIGHT WO CONTRAST  Result Date: 11/25/2020 CLINICAL DATA:  History of partial fifth ray amputation 08/28/2020. Pain, swelling and open wound with hernial and discharge for 2 days. EXAM: MRI OF THE RIGHT FOREFOOT WITHOUT CONTRAST TECHNIQUE: Multiplanar, multisequence MR imaging of the right foot was performed. No intravenous contrast was administered. COMPARISON:  Radiograph 11/23/2020 FINDINGS: Extensive and advanced changes of osteomyelitis involving the residual fifth metatarsal which is destroyed. Adjacent fluid collection measures 4.5 cm and is highly suspicious for an abscess. There is also an open wound in this area. There is severe diffuse cellulitis and myofasciitis. The other bony structures are intact. IMPRESSION: 1. Extensive and advanced changes of osteomyelitis involving the residual  fifth metatarsal. 2. Adjacent 4.5 cm fluid collection highly suspicious for an abscess. 3. Severe diffuse cellulitis and myofasciitis. Electronically Signed   By: Marijo Sanes M.D.   On: 11/25/2020 07:55   DG Foot Complete Right  Result Date: 11/23/2020 CLINICAL DATA:  Recent partial amputation, concern for osteomyelitis. EXAM: RIGHT FOOT COMPLETE - 3+ VIEW COMPARISON:  08/25/2020 FINDINGS: Prior amputation of the small toe. Recognizable 3.1 cm proximal portion of the fifth metatarsal noted, lung distal  margin of this there is a 3.8 cm region of amorphous hazy calcification suspicious for a destroyed bony fragment from osteomyelitis, less likely to be heterotopic ossification along the prior amputation. No appreciable destructive findings along the remaining metatarsals or phalanges. Mildly exaggerated longitudinal arch of the foot on the lateral projection which is not obtained with the patient standing. Extensive soft tissue swelling along the forefoot especially dorsally. Irregularity of the cutaneous surface along the amputation site and lateral to the fifth metatarsal. Small Achilles calcaneal spur. IMPRESSION: 1. 3.8 cm region of amorphous hazy calcification corresponding to the shaft of the fifth metatarsal, probably bony destruction from active osteomyelitis and less likely to from heterotopic ossification along the site of prior amputation. There is a 3.1 cm fragment of proximal fifth metatarsal which appears recognizable, proximal to this hazy calcification. Soft tissue swelling and irregularity of the overlying soft tissues. Electronically Signed   By: Van Clines M.D.   On: 11/23/2020 15:10    Medications:    (feeding supplement) PROSource Plus  30 mL Oral BID BM   vitamin C  500 mg Oral Daily   cholecalciferol  1,000 Units Oral Daily   enoxaparin (LOVENOX) injection  40 mg Subcutaneous Q24H   feeding supplement  237 mL Oral TID BM   folic acid  1 mg Oral Daily   insulin aspart  0-5 Units Subcutaneous QHS   insulin aspart  0-9 Units Subcutaneous TID WC   multivitamin with minerals  1 tablet Oral Daily   predniSONE  20 mg Oral Q breakfast   sodium chloride flush  3 mL Intravenous Q12H   thiamine  100 mg Oral Daily   zinc sulfate  220 mg Oral Daily   Continuous Infusions:  sodium chloride Stopped (11/25/20 0658)    ceFAZolin (ANCEF) IV Stopped (11/25/20 2330)     LOS: 2 days   Geradine Girt  [facility]   How to contact the [facility] Attending or Consulting provider  Kelly or covering provider during after hours [place], for this patient?  Check the care team in [facility] and look for a) attending/consulting [facility] provider listed and b) the [facility] team listed Log into [email or web address] and use Jessie's universal password to access. If you do not have the password, please contact the hospital operator. Locate the [facility] provider you are looking for under [facility] and page to a number that you can be directly reached. If you still have difficulty reaching the provider, please page the [facility] (Director on Call) for the Hospitalists listed on amion for assistance.  11/25/2020, 12:50 PM

## 2020-11-25 NOTE — Consult Note (Addendum)
[facility] Nurse Consult Note: Reason for Consult: Consult requested for buttocks and legs.  [facility] team performed a consult for bilat feet yesterday; refer to previous consult notes for descriptions and topical treatment recommendations. Right inner buttocks with Stage 3 pressure injury; 5X5X.1cm, red and moist, inner center is 1X1cm dark purple red deep tissue pressure injury. Pt is frequently incontinent of liquid stool and it is difficult to keep the location from being soiled.  Bilat legs with patchy areas of partial thickness skin loss; red and moist, mod amt yellow drainage.  Left leg 3X3X.1cm, 1X1X.1cm Right leg 3X5X.1cm, 1X1X.1cm Dressing procedure/placement/frequency: Topical treatment orders provided for bedside nurses to perform as follows: Foam dressings to protect leg wounds and absorb drainage.  Apply Desitin to buttocks TID and PRN when turning or cleaning.  Leave foam dressing off since patient is frequently stooling and it was becoming trapped underneath. Please re-consult if further assistance is needed.  Thank-you,  Julien Girt MSN, [place], [place], [place], [place]

## 2020-11-25 NOTE — Progress Notes (Signed)
D/w pt MRI findings.  I do not believe the foot is salvageable.  Have d/w Dr Stanford Breed.  He will proceed with right BKA on Friday if his pulmonary status improves.  Ruta Hinds, MD [facility] Office: [phone]

## 2020-11-25 NOTE — Progress Notes (Addendum)
  Progress Note    11/25/2020 7:28 AM * No surgery found *  Subjective:  Did not sleep well.   Vitals:   11/24/20 2049 11/25/20 0434  BP: 115/70 126/73  Pulse: 88 87  Resp: 18 20  Temp: 97.7 F (36.5 C) 97.6 F (36.4 C)  SpO2: 98% 98%    Physical Exam: General appearance: Awake, alert in no apparent distress Cardiac: Heart rate and rhythm are regular Respirations: Nonlabored Extremities: Right lower leg and foot with dry dressing in place. Decreased touch sensation; able to wiggle toes.   CBC    Component Value Date/Time   WBC 18.8 (H) 11/25/2020 0308   RBC 3.83 (L) 11/25/2020 0308   HGB 10.5 (L) 11/25/2020 0308   HCT 32.2 (L) 11/25/2020 0308   PLT 500 (H) 11/25/2020 0308   MCV 84.1 11/25/2020 0308   MCH 27.4 11/25/2020 0308   MCHC 32.6 11/25/2020 0308   RDW 16.5 (H) 11/25/2020 0308   LYMPHSABS 2.1 11/25/2020 0308   MONOABS 1.0 11/25/2020 0308   EOSABS 0.2 11/25/2020 0308   BASOSABS 0.1 11/25/2020 0308    BMET    Component Value Date/Time   NA 136 11/25/2020 0308   NA 140 01/04/2019 1451   K 3.6 11/25/2020 0308   CL 102 11/25/2020 0308   CO2 29 11/25/2020 0308   GLUCOSE 109 (H) 11/25/2020 0308   BUN 22 11/25/2020 0308   BUN 12 01/04/2019 1451   CREATININE 0.83 11/25/2020 0308   CALCIUM 8.0 (L) 11/25/2020 0308   GFRNONAA >60 11/25/2020 0308   GFRAA 91 01/04/2019 1451     Intake/Output Summary (Last 24 hours) at 11/25/2020 0728 Last data filed at 11/25/2020 0658 Gross per 24 hour  Intake 2201.08 ml  Output 1050 ml  Net 1151.08 ml    HOSPITAL MEDICATIONS Scheduled Meds:  (feeding supplement) PROSource Plus  30 mL Oral BID BM   vitamin C  500 mg Oral Daily   cholecalciferol  1,000 Units Oral Daily   enoxaparin (LOVENOX) injection  40 mg Subcutaneous Q24H   feeding supplement  237 mL Oral TID BM   folic acid  1 mg Oral Daily   insulin aspart  0-5 Units Subcutaneous QHS   insulin aspart  0-9 Units Subcutaneous TID WC   multivitamin with minerals   1 tablet Oral Daily   predniSONE  20 mg Oral Q breakfast   sodium chloride flush  3 mL Intravenous Q12H   thiamine  100 mg Oral Daily   zinc sulfate  220 mg Oral Daily   Continuous Infusions:  sodium chloride Stopped (11/25/20 0658)    ceFAZolin (ANCEF) IV Stopped (11/25/20 0658)   PRN Meds:.sodium chloride, acetaminophen **OR** acetaminophen, albuterol, bisacodyl, guaiFENesin-dextromethorphan, liver oil-zinc oxide, magnesium citrate, oxyCODONE, polyethylene glycol, sodium chloride flush  Assessment and Plan:  PAD with arteriogram showing mild tibial disease Possible recurrent osteomyelitis of right 5th metatarsal. Afebrile. Leukocytosis>>trending down. MRI performed yesterday of right foot>>results pending.   Risa Grill, PA-C [facility] [phone] 11/25/2020  7:28 AM   Agree with above.  Patient with progression of osteomyelitis in the right fifth metatarsal as well as possible abscess in the right foot.  I will discuss with Dr. Stanford Breed today whether or not to proceed with further debridement of the right foot versus below-knee amputation.  Ruta Hinds, MD [facility] [place] Office: [phone]

## 2020-11-26 ENCOUNTER — Inpatient Hospital Stay (HOSPITAL_COMMUNITY): Payer: Medicare Other

## 2020-11-26 DIAGNOSIS — B9561 Methicillin susceptible Staphylococcus aureus infection as the cause of diseases classified elsewhere: Secondary | ICD-10-CM

## 2020-11-26 DIAGNOSIS — R7881 Bacteremia: Secondary | ICD-10-CM

## 2020-11-26 DIAGNOSIS — M86172 Other acute osteomyelitis, left ankle and foot: Secondary | ICD-10-CM | POA: Diagnosis not present

## 2020-11-26 LAB — ECHOCARDIOGRAM COMPLETE
AR max vel: 2.86 cm2
AV Area VTI: 2.8 cm2
AV Area mean vel: 2.82 cm2
AV Mean grad: 5.7 mmHg
AV Peak grad: 10.2 mmHg
Ao pk vel: 1.59 m/s
S' Lateral: 3 cm

## 2020-11-26 LAB — COMPREHENSIVE METABOLIC PANEL
ALT: 19 U/L (ref 0–44)
AST: 32 U/L (ref 15–41)
Albumin: 1.8 g/dL — ABNORMAL LOW (ref 3.5–5.0)
Alkaline Phosphatase: 53 U/L (ref 38–126)
Anion gap: 7 (ref 5–15)
BUN: 15 mg/dL (ref 8–23)
CO2: 25 mmol/L (ref 22–32)
Calcium: 7.9 mg/dL — ABNORMAL LOW (ref 8.9–10.3)
Chloride: 104 mmol/L (ref 98–111)
Creatinine, Ser: 0.75 mg/dL (ref 0.61–1.24)
GFR, Estimated: >60 mL/min (ref >60)
Glucose, Bld: 88 mg/dL (ref 70–99)
Potassium: 4.1 mmol/L (ref 3.5–5.1)
Sodium: 136 mmol/L (ref 135–145)
Total Bilirubin: 0.6 mg/dL (ref 0.3–1.2)
Total Protein: 4.8 g/dL — ABNORMAL LOW (ref 6.5–8.1)

## 2020-11-26 LAB — CULTURE, BLOOD (SINGLE): Special Requests: ADEQUATE

## 2020-11-26 LAB — CBC WITH DIFFERENTIAL/PLATELET
Abs Immature Granulocytes: 0.93 10*3/uL — ABNORMAL HIGH (ref 0.00–0.07)
Basophils Absolute: 0.1 10*3/uL (ref 0.0–0.1)
Basophils Relative: 0 %
Eosinophils Absolute: 0.3 10*3/uL (ref 0.0–0.5)
Eosinophils Relative: 2 %
HCT: 32.6 % — ABNORMAL LOW (ref 39.0–52.0)
Hemoglobin: 10.3 g/dL — ABNORMAL LOW (ref 13.0–17.0)
Immature Granulocytes: 5 %
Lymphocytes Relative: 13 %
Lymphs Abs: 2.5 10*3/uL (ref 0.7–4.0)
MCH: 26.6 pg (ref 26.0–34.0)
MCHC: 31.6 g/dL (ref 30.0–36.0)
MCV: 84.2 fL (ref 80.0–100.0)
Monocytes Absolute: 1 10*3/uL (ref 0.1–1.0)
Monocytes Relative: 5 %
Neutro Abs: 13.7 10*3/uL — ABNORMAL HIGH (ref 1.7–7.7)
Neutrophils Relative %: 75 %
Platelets: 473 10*3/uL — ABNORMAL HIGH (ref 150–400)
RBC: 3.87 MIL/uL — ABNORMAL LOW (ref 4.22–5.81)
RDW: 16.6 % — ABNORMAL HIGH (ref 11.5–15.5)
WBC: 18.4 10*3/uL — ABNORMAL HIGH (ref 4.0–10.5)
nRBC: 0 % (ref 0.0–0.2)

## 2020-11-26 LAB — GLUCOSE, CAPILLARY
Glucose-Capillary: 82 mg/dL (ref 70–99)
Glucose-Capillary: 151 mg/dL — ABNORMAL HIGH (ref 70–99)
Glucose-Capillary: 149 mg/dL — ABNORMAL HIGH (ref 70–99)
Glucose-Capillary: 98 mg/dL (ref 70–99)

## 2020-11-26 LAB — C-REACTIVE PROTEIN: CRP: 8.9 mg/dL — ABNORMAL HIGH (ref <1.0)

## 2020-11-26 MED ORDER — LACTATED RINGERS IV SOLN: INTRAVENOUS | Status: DC

## 2020-11-26 NOTE — Progress Notes (Deleted)
[facility]  Date of Admission:  11/23/2020     Total days of antibiotics 4         ASSESSMENT:  Joel Perez blood cultures from 11/23/20 and 11/25/20 have remained without growth to date. He does have chronic back pain and in the setting of recurrent MSSA bacteremia will have to continue to monitor despite likely source of infection being his right foot. Awaiting possible BKA on Friday. TTE results pending. Continue current dose of Cefazolin and monitor cultures for clearance of bacteremia.   PLAN:  Continue current dose of Cefazolin.  Monitor cultures for clearance of bacteremia. Await results of TTE and consider TEE if needed.  Possible BKA on Friday.  Remaining medical care per primary team.    Active Problems:   Neuropathy, alcoholic (HCC)   Chronic respiratory failure with hypoxia (HCC)   PVD (peripheral vascular disease) ([place])   Multiple pulmonary nodules determined by computed tomography of lung   Osteomyelitis of ankle or foot, acute, left (HCC)   RLL pneumonia    (feeding supplement) PROSource Plus  30 mL Oral BID BM   vitamin C  500 mg Oral Daily   cholecalciferol  1,000 Units Oral Daily   cyclobenzaprine  10 mg Oral QHS   enoxaparin (LOVENOX) injection  40 mg Subcutaneous Q24H   feeding supplement  237 mL Oral TID BM   folic acid  1 mg Oral Daily   insulin aspart  0-5 Units Subcutaneous QHS   insulin aspart  0-9 Units Subcutaneous TID WC   melatonin  10 mg Oral QHS   multivitamin with minerals  1 tablet Oral Daily   predniSONE  20 mg Oral Q breakfast   saccharomyces boulardii  250 mg Oral BID   sodium chloride flush  3 mL Intravenous Q12H   thiamine  100 mg Oral Daily   zinc sulfate  220 mg Oral Daily    SUBJECTIVE:  Afebrile overnight with no acute events. Wanting to know when the antibiotics will kick in so his appetite improves. Having back pain that has been unchanged.   Allergies  Allergen Reactions   Other Swelling and  Other (See Comments)    Farmed Fish (tightness in throat & lip swelling)   Codeine Rash   Sulfa Antibiotics Hives     Review of Systems: Review of Systems  Constitutional:  Positive for malaise/fatigue. Negative for chills, fever and weight loss.  Respiratory:  Negative for cough, shortness of breath and wheezing.   Cardiovascular:  Negative for chest pain and leg swelling.  Gastrointestinal:  Negative for abdominal pain, constipation, diarrhea, nausea and vomiting.  Musculoskeletal:  Positive for back pain.  Skin:  Negative for rash.     OBJECTIVE: Vitals:   11/25/20 2050 11/25/20 2149 11/26/20 0518 11/26/20 0934  BP: 133/72 123/63 135/63 108/61  Pulse: 89 86 92 96  Resp: 16 20 20 18   Temp: 97.7 F (36.5 C) 98.4 F (36.9 C) 97.9 F (36.6 C) 98.3 F (36.8 C)  TempSrc: Oral Oral Oral   SpO2: 96% 98% 94% 94%  Weight:       Body mass index is 32.68 kg/m.  Physical Exam Constitutional:      General: He is not in acute distress.    Appearance: He is well-developed.  Cardiovascular:     Rate and Rhythm: Normal rate and regular rhythm.     Heart sounds: Normal heart sounds.  Pulmonary:     Effort: Pulmonary effort  is normal.     Breath sounds: Normal breath sounds.  Skin:    General: Skin is warm and dry.  Neurological:     Mental Status: He is alert and oriented to person, place, and time.  Psychiatric:        Mood and Affect: Mood normal.    Lab Results Lab Results  Component Value Date   WBC 18.4 (H) 11/26/2020   HGB 10.3 (L) 11/26/2020   HCT 32.6 (L) 11/26/2020   MCV 84.2 11/26/2020   PLT 473 (H) 11/26/2020    Lab Results  Component Value Date   CREATININE 0.75 11/26/2020   BUN 15 11/26/2020   NA 136 11/26/2020   K 4.1 11/26/2020   CL 104 11/26/2020   CO2 25 11/26/2020    Lab Results  Component Value Date   ALT 19 11/26/2020   AST 32 11/26/2020   ALKPHOS 53 11/26/2020   BILITOT 0.6 11/26/2020     Microbiology: Recent Results (from the past  240 hour(s))  Blood culture (routine single)     Status: Abnormal   Collection Time: 11/23/20  1:58 PM   Specimen: BLOOD RIGHT HAND  Result Value Ref Range Status   Specimen Description BLOOD RIGHT HAND  Final   Special Requests   Final    BOTTLES DRAWN AEROBIC AND ANAEROBIC Blood Culture adequate volume   Culture  Setup Time   Final    GRAM POSITIVE COCCI IN CLUSTERS AEROBIC BOTTLE ONLY CRITICAL RESULT CALLED TO, READ BACK BY AND VERIFIED WITH: Lanae Boast PHARMD 2011 11/24/20 A BROWNING Performed at [facility], [address] [place], [place] [address]    Culture STAPHYLOCOCCUS AUREUS (A)  Final   Report Status 11/26/2020 FINAL  Final   Organism ID, Bacteria STAPHYLOCOCCUS AUREUS  Final      Susceptibility   Staphylococcus aureus - MIC*    CIPROFLOXACIN <=0.5 SENSITIVE Sensitive     ERYTHROMYCIN >=8 RESISTANT Resistant     GENTAMICIN <=0.5 SENSITIVE Sensitive     OXACILLIN <=0.25 SENSITIVE Sensitive     TETRACYCLINE <=1 SENSITIVE Sensitive     VANCOMYCIN <=0.5 SENSITIVE Sensitive     TRIMETH/SULFA <=10 SENSITIVE Sensitive     CLINDAMYCIN >=8 RESISTANT Resistant     RIFAMPIN <=0.5 SENSITIVE Sensitive     Inducible Clindamycin NEGATIVE Sensitive     * STAPHYLOCOCCUS AUREUS  Blood Culture ID Panel (Reflexed)     Status: Abnormal   Collection Time: 11/23/20  1:58 PM  Result Value Ref Range Status   Enterococcus faecalis NOT DETECTED NOT DETECTED Final   Enterococcus Faecium NOT DETECTED NOT DETECTED Final   Listeria monocytogenes NOT DETECTED NOT DETECTED Final   Staphylococcus species DETECTED (A) NOT DETECTED Final    Comment: CRITICAL RESULT CALLED TO, READ BACK BY AND VERIFIED WITH: K HURTH PHARMD 2011 11/24/20 A BROWNING    Staphylococcus aureus (BCID) DETECTED (A) NOT DETECTED Final    Comment: CRITICAL RESULT CALLED TO, READ BACK BY AND VERIFIED WITH: K HURTH PHARMD 2011 11/24/20 A BROWNING    Staphylococcus epidermidis NOT DETECTED NOT DETECTED Final   Staphylococcus  lugdunensis NOT DETECTED NOT DETECTED Final   Streptococcus species NOT DETECTED NOT DETECTED Final   Streptococcus agalactiae NOT DETECTED NOT DETECTED Final   Streptococcus pneumoniae NOT DETECTED NOT DETECTED Final   Streptococcus pyogenes NOT DETECTED NOT DETECTED Final   A.calcoaceticus-baumannii NOT DETECTED NOT DETECTED Final   Bacteroides fragilis NOT DETECTED NOT DETECTED Final   Enterobacterales NOT  DETECTED NOT DETECTED Final   Enterobacter cloacae complex NOT DETECTED NOT DETECTED Final   Escherichia coli NOT DETECTED NOT DETECTED Final   Klebsiella aerogenes NOT DETECTED NOT DETECTED Final   Klebsiella oxytoca NOT DETECTED NOT DETECTED Final   Klebsiella pneumoniae NOT DETECTED NOT DETECTED Final   Proteus species NOT DETECTED NOT DETECTED Final   Salmonella species NOT DETECTED NOT DETECTED Final   Serratia marcescens NOT DETECTED NOT DETECTED Final   Haemophilus influenzae NOT DETECTED NOT DETECTED Final   Neisseria meningitidis NOT DETECTED NOT DETECTED Final   Pseudomonas aeruginosa NOT DETECTED NOT DETECTED Final   Stenotrophomonas maltophilia NOT DETECTED NOT DETECTED Final   Candida albicans NOT DETECTED NOT DETECTED Final   Candida auris NOT DETECTED NOT DETECTED Final   Candida glabrata NOT DETECTED NOT DETECTED Final   Candida krusei NOT DETECTED NOT DETECTED Final   Candida parapsilosis NOT DETECTED NOT DETECTED Final   Candida tropicalis NOT DETECTED NOT DETECTED Final   Cryptococcus neoformans/gattii NOT DETECTED NOT DETECTED Final   Meth resistant mecA/C and MREJ NOT DETECTED NOT DETECTED Final    Comment: Performed at [facility], [place] [address], [place], [place] [address]  Culture, blood (routine x 2)     Status: None (Preliminary result)   Collection Time: 11/23/20  3:39 PM   Specimen: BLOOD  Result Value Ref Range Status   Specimen Description BLOOD SITE NOT SPECIFIED  Final   Special Requests   Final    BOTTLES DRAWN AEROBIC AND ANAEROBIC  Blood Culture results may not be optimal due to an excessive volume of blood received in culture bottles   Culture   Final    NO GROWTH 3 DAYS Performed at [facility], [place] [address], [place], [place] [address]    Report Status PENDING  Incomplete  Culture, blood (routine x 2)     Status: None (Preliminary result)   Collection Time: 11/23/20  3:57 PM   Specimen: BLOOD  Result Value Ref Range Status   Specimen Description BLOOD SITE NOT SPECIFIED  Final   Special Requests   Final    BOTTLES DRAWN AEROBIC AND ANAEROBIC Blood Culture results may not be optimal due to an inadequate volume of blood received in culture bottles   Culture   Final    NO GROWTH 3 DAYS Performed at [facility], [place] [address] [place], [place] [address]    Report Status PENDING  Incomplete  Resp Panel by RT-PCR (Flu A&B, Covid) Nasopharyngeal Swab     Status: Abnormal   Collection Time: 11/23/20  7:27 PM   Specimen: Nasopharyngeal Swab; Nasopharyngeal(NP) swabs in vial transport medium  Result Value Ref Range Status   SARS Coronavirus 2 by RT PCR POSITIVE (A) NEGATIVE Final    Comment: RESULT CALLED TO, READ BACK BY AND VERIFIED WITH: RN BOKIAGON C. BY MESSAN H. AT 2055 ON 11/23/2020 (NOTE) SARS-CoV-2 target nucleic acids are DETECTED.  The SARS-CoV-2 RNA is generally detectable in upper respiratory specimens during the acute phase of infection. Positive results are indicative of the presence of the identified virus, but do not rule out bacterial infection or co-infection with other pathogens not detected by the test. Clinical correlation with patient history and other diagnostic information is necessary to determine patient infection status. The expected result is Negative.  Fact Sheet for Patients: EntrepreneurPulse.com.au  Fact Sheet for Healthcare Providers: IncredibleEmployment.be  This test is not yet approved or cleared by the [place] FDA  and   has been authorized for detection and/or diagnosis of SARS-CoV-2 by FDA under an Emergency Use Authorization (EUA).  This EUA will remain in effect (meaning th is test can be used) for the duration of  the COVID-19 declaration under Section 564(b)(1) of the Act, 21 U.S.C. section 360bbb-3(b)(1), unless the authorization is terminated or revoked sooner.     Influenza A by PCR NEGATIVE NEGATIVE Final   Influenza B by PCR NEGATIVE NEGATIVE Final    Comment: (NOTE) The Xpert Xpress SARS-CoV-2/FLU/RSV plus assay is intended as an aid in the diagnosis of influenza from Nasopharyngeal swab specimens and should not be used as a sole basis for treatment. Nasal washings and aspirates are unacceptable for Xpert Xpress SARS-CoV-2/FLU/RSV testing.  Fact Sheet for Patients: EntrepreneurPulse.com.au  Fact Sheet for Healthcare Providers: IncredibleEmployment.be  This test is not yet approved or cleared by the [place] FDA and has been authorized for detection and/or diagnosis of SARS-CoV-2 by FDA under an Emergency Use Authorization (EUA). This EUA will remain in effect (meaning this test can be used) for the duration of the COVID-19 declaration under Section 564(b)(1) of the Act, 21 U.S.C. section 360bbb-3(b)(1), unless the authorization is terminated or revoked.  Performed at [facility], [place] [address], [place], [place] [address]   Culture, blood (routine x 2)     Status: None (Preliminary result)   Collection Time: 11/25/20 12:46 PM   Specimen: BLOOD LEFT WRIST  Result Value Ref Range Status   Specimen Description BLOOD LEFT WRIST  Final   Special Requests   Final    BOTTLES DRAWN AEROBIC AND ANAEROBIC Blood Culture adequate volume   Culture   Final    NO GROWTH < 24 HOURS Performed at [facility], [place] [address], [place], [place] [address]    Report Status PENDING  Incomplete  Culture, blood (routine x 2)     Status: None  (Preliminary result)   Collection Time: 11/25/20 12:48 PM   Specimen: BLOOD  Result Value Ref Range Status   Specimen Description BLOOD LEFT ANTECUBITAL  Final   Special Requests   Final    BOTTLES DRAWN AEROBIC AND ANAEROBIC Blood Culture adequate volume   Culture   Final    NO GROWTH < 24 HOURS Performed at [facility], [place] [address] [place], [place] [address]    Report Status PENDING  Incomplete     Terri Piedra, NP [facility]  11/26/2020  11:06 AM

## 2020-11-26 NOTE — TOC Initial Note (Signed)
Transition of Care [facility]) - Initial/Assessment Note    Patient Details  Name: Joel Perez MRN: [id number] Date of Birth: 11-17-1945  [facility] CM/SW Contact:    Carles Collet, RN Phone Number: 11/26/2020, 3:47 PM  Clinical Narrative:       Patient from home w wife. Active w [facility] prior to admission. Uses 4L O2 at home. Scheduled for BKA tomorrow. TOC will continue to follow               Barriers to Discharge: Continued Medical Work up   Patient Goals and CMS Choice        Expected Discharge Plan and Services     Discharge Planning Services: CM Consult   Living arrangements for the past 2 months: [place]                                      Prior Living Arrangements/Services Living arrangements for the past 2 months: Single Family Home Lives with:: Spouse                   Activities of Daily Living      Permission Sought/Granted                  Emotional Assessment              Admission diagnosis:  Weakness [R53.1] Foot infection [L08.9] Osteomyelitis of ankle or foot, acute, left (HCC) [M86.172] Leukocytosis, unspecified type [D72.829] Patient Active Problem List   Diagnosis Date Noted   MSSA bacteremia 11/26/2020   Osteomyelitis of ankle or foot, acute, left ([place]) 11/23/2020   RLL pneumonia 11/23/2020   Antibiotic-associated diarrhea 09/16/2020   PICC (peripherally inserted central catheter) in place 09/16/2020   Osteomyelitis ([place]) 08/25/2020   Multiple pulmonary nodules determined by computed tomography of lung 08/14/2020   Pressure injury of skin 07/23/2020   Sepsis ([place]) 07/22/2020   Edema of both legs 12/19/2018   PVD (peripheral vascular disease) ([place]) 12/19/2018   DOE (dyspnea on exertion) 11/14/2018   Chronic respiratory failure with hypoxia ([place]) 04/06/2018   Decreased pedal pulses 01/17/2017   Mild aortic valve regurgitation 01/17/2017   Leg swelling 12/30/2016   Neuropathy,  alcoholic ([place]) 51/88/4166   COPD III with marked reversibility 12/27/2012   PCP:  [name], MD Pharmacy:   [facility] ([place]), [place] - [address] [place] ([place]) [place] [address] Phone: [phone] Fax: [phone]     Social Determinants of Health ([place]) Interventions    Readmission Risk Interventions No flowsheet data found.

## 2020-11-26 NOTE — Plan of Care (Signed)
  Problem: Health Behavior/Discharge Planning: Goal: Ability to manage health-related needs will improve Outcome: Progressing   Problem: Activity: Goal: Risk for activity intolerance will decrease Outcome: Progressing   Problem: Nutrition: Goal: Adequate nutrition will be maintained Outcome: Progressing   Problem: Pain Managment: Goal: General experience of comfort will improve Outcome: Progressing   Problem: Safety: Goal: Ability to remain free from injury will improve Outcome: Progressing   Problem: Skin Integrity: Goal: Risk for impaired skin integrity will decrease Outcome: Progressing

## 2020-11-26 NOTE — Plan of Care (Signed)

## 2020-11-26 NOTE — Progress Notes (Signed)
PROGRESS NOTE   Joel Perez  ONG:[id number] DOB: 02/12/1946 DOA: 11/23/2020 PCP: [name], MD  Brief Narrative:  75 year old white male community dwelling HFpEF LBBB  COPD +4 L at baseline follows with Dr. [name] Right fifth toe amputation by vascular surgery 08/28/2020 growing MSSA--went home on PICC line with Ancef until 5/22 and wound BMI 33 HFpEF 60-65%  Readmit from home after home health nurse noted wound is looking purulent-patient was feeling weak over the past 3 days also had been on prednisone On exam found to have foul-smelling infected lateral right foot x-ray consistent with osteomyelitis CT chest = RLL pneumonia 1.5 spiculated RUL mass WBC 25  Hospital-Problem based course  MSSA bacteremia/osteomyelitis of ray amputation Defer to ID and vascular-?  BKA 7/5 PM Blood culture X2 from 7/3 confirmed staph-surveillance cultures 7/5 still pending Echocardiogram 7/6 no valvular vegetations CRP downtrending R LL infiltrate?  Postobstructive Repeat CXR in a.m. 7/7 no current change in antibiotics at this time RUL 1.5 spiculated mass Outpatient follow-up Dr. [name] Recent COVID-19 infection Treated--curently relatively asymptomatic HFpEF EF 60-65% Losartan 50 on hold Lasix 20 on hold Revatio 20 on hold Resume slowly in the next several very ?  DM TY 2 Obesity BMI 32 Not on any prior to admission meds-sugars trending 82-1 50 Discontinue sliding scale Attempt in the outpatient setting to de-escalate steroids and will CC Dr. [name] to coordinate this   DVT prophylaxis: lovenox Code Status: full Family Communication: wife at bedsdie--updated fully--they understand plan for surgery tomorrow Disposition:  Status is: Inpatient  Remains inpatient appropriate because:Hemodynamically unstable and Unsafe d/c plan  Dispo: The patient is from: Home              Anticipated d/c is to: SNF              Patient currently is not medically stable to d/c.   Difficult to place patient  No       Consultants:  ortho  Procedures:   Antimicrobials:     Subjective: Awake coherent in nad no pain, fever chills rgor , n,v Eating ok Some sputum--not above basleine   Objective: Vitals:   11/25/20 2050 11/25/20 2149 11/26/20 0518 11/26/20 0934  BP: 133/72 123/63 135/63 108/61  Pulse: 89 86 92 96  Resp: 16 20 20 18   Temp: 97.7 F (36.5 C) 98.4 F (36.9 C) 97.9 F (36.6 C) 98.3 F (36.8 C)  TempSrc: Oral Oral Oral   SpO2: 96% 98% 94% 94%  Weight:        Intake/Output Summary (Last 24 hours) at 11/26/2020 1445 Last data filed at 11/26/2020 0900 Gross per 24 hour  Intake 357 ml  Output 400 ml  Net -43 ml   Filed Weights   11/23/20 1524  Weight: 103.3 kg    Examination:  Eomi thick neck on oxygen in nad Cta b no rales S1 s2no m--PVC on monitor Abd soft nt dn no rebound LE's not swollen  Data Reviewed: personally reviewed   CBC    Component Value Date/Time   WBC 18.4 (H) 11/26/2020 0243   RBC 3.87 (L) 11/26/2020 0243   HGB 10.3 (L) 11/26/2020 0243   HCT 32.6 (L) 11/26/2020 0243   PLT 473 (H) 11/26/2020 0243   MCV 84.2 11/26/2020 0243   MCH 26.6 11/26/2020 0243   MCHC 31.6 11/26/2020 0243   RDW 16.6 (H) 11/26/2020 0243   LYMPHSABS 2.5 11/26/2020 0243   MONOABS 1.0 11/26/2020 0243   EOSABS 0.3 11/26/2020 0243  BASOSABS 0.1 11/26/2020 0243   CMP Latest Ref Rng & Units 11/26/2020 11/25/2020 11/24/2020  Glucose 70 - 99 mg/dL 88 109(H) 96  BUN 8 - 23 mg/dL 15 22 23   Creatinine 0.61 - 1.24 mg/dL 0.75 0.83 0.87  Sodium 135 - 145 mmol/L 136 136 134(L)  Potassium 3.5 - 5.1 mmol/L 4.1 3.6 3.1(L)  Chloride 98 - 111 mmol/L 104 102 101  CO2 22 - 32 mmol/L 25 29 27   Calcium 8.9 - 10.3 mg/dL 7.9(L) 8.0(L) 7.6(L)  Total Protein 6.5 - 8.1 g/dL 4.8(L) 4.8(L) 4.6(L)  Total Bilirubin 0.3 - 1.2 mg/dL 0.6 0.6 1.0  Alkaline Phos 38 - 126 U/L 53 62 59  AST 15 - 41 U/L 32 33 28  ALT 0 - 44 U/L 19 25 21      Radiology Studies: MR FOOT RIGHT WO  CONTRAST  Result Date: 11/25/2020 CLINICAL DATA:  History of partial fifth ray amputation 08/28/2020. Pain, swelling and open wound with hernial and discharge for 2 days. EXAM: MRI OF THE RIGHT FOREFOOT WITHOUT CONTRAST TECHNIQUE: Multiplanar, multisequence MR imaging of the right foot was performed. No intravenous contrast was administered. COMPARISON:  Radiograph 11/23/2020 FINDINGS: Extensive and advanced changes of osteomyelitis involving the residual fifth metatarsal which is destroyed. Adjacent fluid collection measures 4.5 cm and is highly suspicious for an abscess. There is also an open wound in this area. There is severe diffuse cellulitis and myofasciitis. The other bony structures are intact. IMPRESSION: 1. Extensive and advanced changes of osteomyelitis involving the residual fifth metatarsal. 2. Adjacent 4.5 cm fluid collection highly suspicious for an abscess. 3. Severe diffuse cellulitis and myofasciitis. Electronically Signed   By: Marijo Sanes M.D.   On: 11/25/2020 07:55     Scheduled Meds:  (feeding supplement) PROSource Plus  30 mL Oral BID BM   vitamin C  500 mg Oral Daily   cholecalciferol  1,000 Units Oral Daily   cyclobenzaprine  10 mg Oral QHS   enoxaparin (LOVENOX) injection  40 mg Subcutaneous Q24H   feeding supplement  237 mL Oral TID BM   folic acid  1 mg Oral Daily   insulin aspart  0-5 Units Subcutaneous QHS   insulin aspart  0-9 Units Subcutaneous TID WC   melatonin  10 mg Oral QHS   multivitamin with minerals  1 tablet Oral Daily   predniSONE  20 mg Oral Q breakfast   saccharomyces boulardii  250 mg Oral BID   sodium chloride flush  3 mL Intravenous Q12H   thiamine  100 mg Oral Daily   zinc sulfate  220 mg Oral Daily   Continuous Infusions:  sodium chloride Stopped (11/25/20 0658)    ceFAZolin (ANCEF) IV 2 g (11/26/20 1315)     LOS: 3 days   Time spent: 41  Nita Sells, MD [facility] To contact the attending provider between 7A-7P or  the covering provider during after hours 7P-7A, please log into the web site [email or web address] and access using universal [place] password for that web site. If you do not have the password, please call the hospital operator.  11/26/2020, 2:45 PM

## 2020-11-26 NOTE — Progress Notes (Signed)
[facility]  Date of Admission:  11/23/2020           Reason for visit: Follow up on osteomyelitis  Current antibiotics: Cefazolin 7/4--present  ASSESSMENT:    Right foot osteomyelitis: MRI shows OM of the residual fifth metatarsal complicated by abscess.  His course is also complicated by MSSA bacteremia.  Vascular surgery is planning for BKA hopefully on Friday as the foot does not appear salvageable.  TTE is pending to evaluate for infective endocarditis.  Repeat blood cultures drawn yesterday are no growth to date.     PLAN:    Continue cefazolin 2 g every 8 hours Follow-up repeat blood cultures Follow-up TTE to evaluate for endocarditis.  If negative, ideally would get TEE however not sure that he would be able to tolerate this procedure Appreciate vascular surgery.  Tentative plan for BKA on Friday Continue to monitor for any worsening back pain.  If so, may need more advanced imaging of the spine to ensure no foci of infection Will follow   Principal Problem:   MSSA bacteremia Active Problems:   Neuropathy, alcoholic ([place])   Chronic respiratory failure with hypoxia ([facility])   PVD (peripheral vascular disease) ([place])   Multiple pulmonary nodules determined by computed tomography of lung   Osteomyelitis of ankle or foot, acute, left ([facility])   RLL pneumonia    MEDICATIONS:    Scheduled Meds:  (feeding supplement) PROSource Plus  30 mL Oral BID BM   vitamin C  500 mg Oral Daily   cholecalciferol  1,000 Units Oral Daily   cyclobenzaprine  10 mg Oral QHS   enoxaparin (LOVENOX) injection  40 mg Subcutaneous Q24H   feeding supplement  237 mL Oral TID BM   folic acid  1 mg Oral Daily   insulin aspart  0-5 Units Subcutaneous QHS   insulin aspart  0-9 Units Subcutaneous TID WC   melatonin  10 mg Oral QHS   multivitamin with minerals  1 tablet Oral Daily   predniSONE  20 mg Oral Q breakfast   saccharomyces boulardii  250 mg Oral BID   sodium  chloride flush  3 mL Intravenous Q12H   thiamine  100 mg Oral Daily   zinc sulfate  220 mg Oral Daily   Continuous Infusions:  sodium chloride Stopped (11/25/20 0658)    ceFAZolin (ANCEF) IV 2 g (11/26/20 0537)   PRN Meds:.sodium chloride, acetaminophen **OR** acetaminophen, albuterol, bisacodyl, guaiFENesin-dextromethorphan, liver oil-zinc oxide, loperamide, magnesium citrate, oxyCODONE, polyethylene glycol, sodium chloride flush  SUBJECTIVE:   24 hour events:  No acute events noted overnight Remains afebrile Currently on 3 L via nasal cannula  Patient seen this morning with his wife at the bedside.  He has no fevers or chills.  He does endorse pain in his foot.  He also has chronic back pain that he has been dealing with for years that is aggravating him but is currently at its baseline.  Review of Systems  All other systems reviewed and are negative.    OBJECTIVE:   Blood pressure 108/61, pulse 96, temperature 98.3 F (36.8 C), resp. rate 18, weight 103.3 kg, SpO2 94 %. Body mass index is 32.68 kg/m.  Physical Exam Constitutional:      General: He is not in acute distress.    Appearance: Normal appearance.  HENT:     Head: Normocephalic and atraumatic.  Pulmonary:     Effort: Pulmonary effort is normal. No respiratory distress.  Comments: Currently on nasal cannula Abdominal:     General: There is no distension.     Palpations: Abdomen is soft.     Tenderness: There is no abdominal tenderness.  Musculoskeletal:     Comments: + Right foot wound  Skin:    General: Skin is warm and dry.     Findings: No rash.  Neurological:     General: No focal deficit present.     Mental Status: He is alert and oriented to person, place, and time.  Psychiatric:        Mood and Affect: Mood normal.        Behavior: Behavior normal.     Lab Results: Lab Results  Component Value Date   WBC 18.4 (H) 11/26/2020   HGB 10.3 (L) 11/26/2020   HCT 32.6 (L) 11/26/2020   MCV  84.2 11/26/2020   PLT 473 (H) 11/26/2020    Lab Results  Component Value Date   NA 136 11/26/2020   K 4.1 11/26/2020   CO2 25 11/26/2020   GLUCOSE 88 11/26/2020   BUN 15 11/26/2020   CREATININE 0.75 11/26/2020   CALCIUM 7.9 (L) 11/26/2020   GFRNONAA >60 11/26/2020   GFRAA 91 01/04/2019    Lab Results  Component Value Date   ALT 19 11/26/2020   AST 32 11/26/2020   ALKPHOS 53 11/26/2020   BILITOT 0.6 11/26/2020       Component Value Date/Time   CRP 8.9 (H) 11/26/2020 0243       Component Value Date/Time   ESRSEDRATE 23 (H) 11/23/2020 1700     I have reviewed the micro and lab results in Epic.  Imaging: MR FOOT RIGHT WO CONTRAST  Result Date: 11/25/2020 CLINICAL DATA:  History of partial fifth ray amputation 08/28/2020. Pain, swelling and open wound with hernial and discharge for 2 days. EXAM: MRI OF THE RIGHT FOREFOOT WITHOUT CONTRAST TECHNIQUE: Multiplanar, multisequence MR imaging of the right foot was performed. No intravenous contrast was administered. COMPARISON:  Radiograph 11/23/2020 FINDINGS: Extensive and advanced changes of osteomyelitis involving the residual fifth metatarsal which is destroyed. Adjacent fluid collection measures 4.5 cm and is highly suspicious for an abscess. There is also an open wound in this area. There is severe diffuse cellulitis and myofasciitis. The other bony structures are intact. IMPRESSION: 1. Extensive and advanced changes of osteomyelitis involving the residual fifth metatarsal. 2. Adjacent 4.5 cm fluid collection highly suspicious for an abscess. 3. Severe diffuse cellulitis and myofasciitis. Electronically Signed   By: Marijo Sanes M.D.   On: 11/25/2020 07:55     Imaging independently reviewed in Epic.    Raynelle [facility] [phone] pager 11/26/2020, 11:14 AM

## 2020-11-26 NOTE — Progress Notes (Signed)
  Progress Note    11/26/2020 7:44 AM * No surgery date entered *  Subjective:  Says he is very weak when sitting up and OOB.   Vitals:   11/25/20 2149 11/26/20 0518  BP: 123/63 135/63  Pulse: 86 92  Resp: 20 20  Temp: 98.4 F (36.9 C) 97.9 F (36.6 C)  SpO2: 98% 94%    Physical Exam: General appearance: Awake, alert in no apparent distress Cardiac: Heart rate and rhythm are regular Respirations: Nonlabored at rest. On 4L O2 via [place]. Extremities: Dressings dry and intact  CBC    Component Value Date/Time   WBC 18.4 (H) 11/26/2020 0243   RBC 3.87 (L) 11/26/2020 0243   HGB 10.3 (L) 11/26/2020 0243   HCT 32.6 (L) 11/26/2020 0243   PLT 473 (H) 11/26/2020 0243   MCV 84.2 11/26/2020 0243   MCH 26.6 11/26/2020 0243   MCHC 31.6 11/26/2020 0243   RDW 16.6 (H) 11/26/2020 0243   LYMPHSABS 2.5 11/26/2020 0243   MONOABS 1.0 11/26/2020 0243   EOSABS 0.3 11/26/2020 0243   BASOSABS 0.1 11/26/2020 0243    BMET    Component Value Date/Time   NA 136 11/26/2020 0243   NA 140 01/04/2019 1451   K 4.1 11/26/2020 0243   CL 104 11/26/2020 0243   CO2 25 11/26/2020 0243   GLUCOSE 88 11/26/2020 0243   BUN 15 11/26/2020 0243   BUN 12 01/04/2019 1451   CREATININE 0.75 11/26/2020 0243   CALCIUM 7.9 (L) 11/26/2020 0243   GFRNONAA >60 11/26/2020 0243   GFRAA 91 01/04/2019 1451     Intake/Output Summary (Last 24 hours) at 11/26/2020 0744 Last data filed at 11/25/2020 2200 Gross per 24 hour  Intake 697 ml  Output 400 ml  Net 297 ml    HOSPITAL MEDICATIONS Scheduled Meds:  (feeding supplement) PROSource Plus  30 mL Oral BID BM   vitamin C  500 mg Oral Daily   cholecalciferol  1,000 Units Oral Daily   cyclobenzaprine  10 mg Oral QHS   enoxaparin (LOVENOX) injection  40 mg Subcutaneous Q24H   feeding supplement  237 mL Oral TID BM   folic acid  1 mg Oral Daily   insulin aspart  0-5 Units Subcutaneous QHS   insulin aspart  0-9 Units Subcutaneous TID WC   melatonin  10 mg Oral QHS    multivitamin with minerals  1 tablet Oral Daily   predniSONE  20 mg Oral Q breakfast   saccharomyces boulardii  250 mg Oral BID   sodium chloride flush  3 mL Intravenous Q12H   thiamine  100 mg Oral Daily   zinc sulfate  220 mg Oral Daily   Continuous Infusions:  sodium chloride Stopped (11/25/20 0658)    ceFAZolin (ANCEF) IV 2 g (11/26/20 0537)   PRN Meds:.sodium chloride, acetaminophen **OR** acetaminophen, albuterol, bisacodyl, guaiFENesin-dextromethorphan, liver oil-zinc oxide, loperamide, magnesium citrate, oxyCODONE, polyethylene glycol, sodium chloride flush  Assessment and Plan: Patient with progression of osteomyelitis in the right fifth metatarsal as well as possible abscess in the right foot. Planning right BKA on Friday if medical condition improved>>MSSA bacteremia on IV abx; RLL infiltrate. Remains afebrile. No change in WBC last 24 hours.  Risa Grill, PA-C [facility] [phone] 11/26/2020  7:44 AM

## 2020-11-26 NOTE — Progress Notes (Signed)
Pt was originally scheduled for right BKA on Friday.  Will plan to do surgery tomorrow early afternoon by Dr. Stanford Breed.  Discussed with pt and his wife of 37 years that it could be delayed if there is an emergency.  They are both in agreement with proceeding tomorrow.   Npo after MN/consent/labs ordered.  Leontine Locket, [facility] 11/26/2020 2:50 PM

## 2020-11-27 ENCOUNTER — Inpatient Hospital Stay (HOSPITAL_COMMUNITY): Payer: Medicare Other | Admitting: Certified Registered"

## 2020-11-27 ENCOUNTER — Inpatient Hospital Stay (HOSPITAL_COMMUNITY): Payer: Medicare Other

## 2020-11-27 ENCOUNTER — Encounter (HOSPITAL_COMMUNITY): Payer: Self-pay | Admitting: Internal Medicine

## 2020-11-27 ENCOUNTER — Encounter (HOSPITAL_COMMUNITY)
Admission: EM | Disposition: A | Discharge status: Skilled Nursing Facility | Payer: Self-pay | Source: Home / Self Care | Attending: Family Medicine

## 2020-11-27 DIAGNOSIS — B9561 Methicillin susceptible Staphylococcus aureus infection as the cause of diseases classified elsewhere: Secondary | ICD-10-CM | POA: Diagnosis not present

## 2020-11-27 DIAGNOSIS — M86671 Other chronic osteomyelitis, right ankle and foot: Secondary | ICD-10-CM

## 2020-11-27 DIAGNOSIS — R7881 Bacteremia: Secondary | ICD-10-CM | POA: Diagnosis not present

## 2020-11-27 HISTORY — PX: AMPUTATION: SHX166

## 2020-11-27 LAB — CBC WITH DIFFERENTIAL/PLATELET
Abs Immature Granulocytes: 1.36 10*3/uL — ABNORMAL HIGH (ref 0.00–0.07)
Basophils Absolute: 0.1 10*3/uL (ref 0.0–0.1)
Basophils Relative: 0 %
Eosinophils Absolute: 0.5 10*3/uL (ref 0.0–0.5)
Eosinophils Relative: 2 %
HCT: 33.1 % — ABNORMAL LOW (ref 39.0–52.0)
Hemoglobin: 10.7 g/dL — ABNORMAL LOW (ref 13.0–17.0)
Immature Granulocytes: 7 %
Lymphocytes Relative: 15 %
Lymphs Abs: 2.9 10*3/uL (ref 0.7–4.0)
MCH: 27.2 pg (ref 26.0–34.0)
MCHC: 32.3 g/dL (ref 30.0–36.0)
MCV: 84 fL (ref 80.0–100.0)
Monocytes Absolute: 1.1 10*3/uL — ABNORMAL HIGH (ref 0.1–1.0)
Monocytes Relative: 5 %
Neutro Abs: 14.1 10*3/uL — ABNORMAL HIGH (ref 1.7–7.7)
Neutrophils Relative %: 71 %
Platelets: 509 10*3/uL — ABNORMAL HIGH (ref 150–400)
RBC: 3.94 MIL/uL — ABNORMAL LOW (ref 4.22–5.81)
RDW: 16.9 % — ABNORMAL HIGH (ref 11.5–15.5)
WBC: 19.9 10*3/uL — ABNORMAL HIGH (ref 4.0–10.5)
nRBC: 0.1 % (ref 0.0–0.2)

## 2020-11-27 LAB — COMPREHENSIVE METABOLIC PANEL
ALT: 12 U/L (ref 0–44)
AST: 29 U/L (ref 15–41)
Albumin: 2 g/dL — ABNORMAL LOW (ref 3.5–5.0)
Alkaline Phosphatase: 57 U/L (ref 38–126)
Anion gap: 7 (ref 5–15)
BUN: 13 mg/dL (ref 8–23)
CO2: 26 mmol/L (ref 22–32)
Calcium: 8.1 mg/dL — ABNORMAL LOW (ref 8.9–10.3)
Chloride: 103 mmol/L (ref 98–111)
Creatinine, Ser: 0.75 mg/dL (ref 0.61–1.24)
GFR, Estimated: >60 mL/min (ref >60)
Glucose, Bld: 82 mg/dL (ref 70–99)
Potassium: 4.4 mmol/L (ref 3.5–5.1)
Sodium: 136 mmol/L (ref 135–145)
Total Bilirubin: 0.5 mg/dL (ref 0.3–1.2)
Total Protein: 5.1 g/dL — ABNORMAL LOW (ref 6.5–8.1)

## 2020-11-27 LAB — GLUCOSE, CAPILLARY
Glucose-Capillary: 84 mg/dL (ref 70–99)
Glucose-Capillary: 76 mg/dL (ref 70–99)
Glucose-Capillary: 88 mg/dL (ref 70–99)
Glucose-Capillary: 92 mg/dL (ref 70–99)
Glucose-Capillary: 185 mg/dL — ABNORMAL HIGH (ref 70–99)

## 2020-11-27 LAB — C-REACTIVE PROTEIN: CRP: 7.9 mg/dL — ABNORMAL HIGH (ref <1.0)

## 2020-11-27 SURGERY — AMPUTATION BELOW KNEE
Anesthesia: Regional | Site: Knee | Laterality: Right

## 2020-11-27 MED ORDER — DOUBLE ANTIBIOTIC 500-10000 UNIT/GM EX OINT
TOPICAL_OINTMENT | CUTANEOUS | Status: AC
  Filled 2020-11-27: qty 28.4

## 2020-11-27 MED ORDER — MIDAZOLAM HCL 2 MG/2ML IJ SOLN
1.0000 mg | Freq: Once | INTRAMUSCULAR | Status: AC
  Filled 2020-11-27: qty 1

## 2020-11-27 MED ORDER — DEXAMETHASONE SODIUM PHOSPHATE 4 MG/ML IJ SOLN
INTRAMUSCULAR | Status: DC | PRN
  Administered 2020-11-27: 4 mg via PERINEURAL
  Administered 2020-11-27: 2 mg via PERINEURAL

## 2020-11-27 MED ORDER — METOPROLOL TARTRATE 5 MG/5ML IV SOLN: 2.0000 mg | INTRAVENOUS | Status: DC | PRN

## 2020-11-27 MED ORDER — ALBUTEROL SULFATE (2.5 MG/3ML) 0.083% IN NEBU
2.5000 mg | INHALATION_SOLUTION | Freq: Once | RESPIRATORY_TRACT | Status: AC
  Filled 2020-11-27: qty 3

## 2020-11-27 MED ORDER — POTASSIUM CHLORIDE CRYS ER 20 MEQ PO TBCR: 20.0000 meq | EXTENDED_RELEASE_TABLET | Freq: Every day | ORAL | Status: DC | PRN

## 2020-11-27 MED ORDER — SODIUM CHLORIDE 0.9 % IV SOLN
3.0000 g | Freq: Four times a day (QID) | INTRAVENOUS | Status: AC
  Administered 2020-11-27 – 2020-12-01 (×18): 3 g via INTRAVENOUS
  Filled 2020-11-27: qty 8
  Filled 2020-11-27 (×2): qty 3
  Filled 2020-11-27 (×4): qty 8
  Filled 2020-11-27: qty 3
  Filled 2020-11-27 (×4): qty 8
  Filled 2020-11-27 (×2): qty 3
  Filled 2020-11-27 (×3): qty 8
  Filled 2020-11-27: qty 3
  Filled 2020-11-27: qty 8

## 2020-11-27 MED ORDER — LABETALOL HCL 5 MG/ML IV SOLN: 10.0000 mg | INTRAVENOUS | Status: DC | PRN

## 2020-11-27 MED ORDER — HYDROMORPHONE HCL 1 MG/ML IJ SOLN: 0.2500 mg | INTRAMUSCULAR | Status: DC | PRN

## 2020-11-27 MED ORDER — PANTOPRAZOLE SODIUM 40 MG PO TBEC
40.0000 mg | DELAYED_RELEASE_TABLET | Freq: Every day | ORAL | Status: DC
  Administered 2020-11-27 – 2020-12-09 (×13): 40 mg via ORAL
  Filled 2020-11-27 (×13): qty 1

## 2020-11-27 MED ORDER — ALUM & MAG HYDROXIDE-SIMETH 200-200-20 MG/5ML PO SUSP
15.0000 mL | ORAL | Status: DC | PRN
Start: 2020-11-27 — End: 2020-12-09

## 2020-11-27 MED ORDER — ROPIVACAINE HCL 7.5 MG/ML IJ SOLN
INTRAMUSCULAR | Status: DC | PRN
  Administered 2020-11-27: 12 mL via PERINEURAL
  Administered 2020-11-27: 8 mL via PERINEURAL

## 2020-11-27 MED ORDER — CHLORHEXIDINE GLUCONATE 0.12 % MT SOLN
OROMUCOSAL | Status: AC
  Administered 2020-11-27: 15 mL
  Filled 2020-11-27: qty 15

## 2020-11-27 MED ORDER — FENTANYL CITRATE (PF) 100 MCG/2ML IJ SOLN
INTRAMUSCULAR | Status: AC
  Administered 2020-11-27: 50 ug via INTRAVENOUS
  Filled 2020-11-27: qty 2

## 2020-11-27 MED ORDER — 0.9 % SODIUM CHLORIDE (POUR BTL) OPTIME
TOPICAL | Status: DC | PRN
  Administered 2020-11-27: 1000 mL

## 2020-11-27 MED ORDER — PROMETHAZINE HCL 25 MG/ML IJ SOLN: 6.2500 mg | INTRAMUSCULAR | Status: DC | PRN

## 2020-11-27 MED ORDER — BISACODYL 5 MG PO TBEC
5.0000 mg | DELAYED_RELEASE_TABLET | Freq: Every day | ORAL | Status: DC | PRN
  Administered 2020-11-30 – 2020-12-07 (×3): 5 mg via ORAL
  Filled 2020-11-27 (×3): qty 1

## 2020-11-27 MED ORDER — ALBUTEROL SULFATE (2.5 MG/3ML) 0.083% IN NEBU
INHALATION_SOLUTION | RESPIRATORY_TRACT | Status: AC
  Administered 2020-11-27: 2.5 mg via RESPIRATORY_TRACT
  Filled 2020-11-27: qty 3

## 2020-11-27 MED ORDER — FENTANYL CITRATE (PF) 250 MCG/5ML IJ SOLN
INTRAMUSCULAR | Status: DC | PRN
  Administered 2020-11-27 (×2): 25 ug via INTRAVENOUS

## 2020-11-27 MED ORDER — PHENOL 1.4 % MT LIQD: 1.0000 | OROMUCOSAL | Status: DC | PRN

## 2020-11-27 MED ORDER — ONDANSETRON HCL 4 MG/2ML IJ SOLN: 4.0000 mg | Freq: Four times a day (QID) | INTRAMUSCULAR | Status: DC | PRN

## 2020-11-27 MED ORDER — MEPERIDINE HCL 25 MG/ML IJ SOLN: 6.2500 mg | INTRAMUSCULAR | Status: DC | PRN

## 2020-11-27 MED ORDER — FENTANYL CITRATE (PF) 250 MCG/5ML IJ SOLN
INTRAMUSCULAR | Status: AC
  Filled 2020-11-27: qty 5

## 2020-11-27 MED ORDER — MIDAZOLAM HCL 2 MG/2ML IJ SOLN
INTRAMUSCULAR | Status: AC
  Administered 2020-11-27: 1 mg via INTRAVENOUS
  Filled 2020-11-27: qty 2

## 2020-11-27 MED ORDER — DOCUSATE SODIUM 100 MG PO CAPS
100.0000 mg | ORAL_CAPSULE | Freq: Every day | ORAL | Status: DC
  Administered 2020-11-28 – 2020-12-09 (×12): 100 mg via ORAL
  Filled 2020-11-27 (×12): qty 1

## 2020-11-27 MED ORDER — SODIUM CHLORIDE 0.9 % IV SOLN: INTRAVENOUS | Status: DC

## 2020-11-27 MED ORDER — LIDOCAINE HCL (PF) 2 % IJ SOLN
INTRAMUSCULAR | Status: DC | PRN
  Administered 2020-11-27: 260 mg via INTRADERMAL
  Administered 2020-11-27: 140 mg via INTRADERMAL

## 2020-11-27 MED ORDER — JUVEN PO PACK
1.0000 | PACK | Freq: Two times a day (BID) | ORAL | Status: DC
  Administered 2020-11-28 – 2020-12-09 (×24): 1 via ORAL
  Filled 2020-11-27 (×24): qty 1

## 2020-11-27 MED ORDER — PROPOFOL 500 MG/50ML IV EMUL
INTRAVENOUS | Status: DC | PRN
  Administered 2020-11-27: 75 ug/kg/min via INTRAVENOUS

## 2020-11-27 MED ORDER — HYDRALAZINE HCL 20 MG/ML IJ SOLN: 5.0000 mg | INTRAMUSCULAR | Status: DC | PRN

## 2020-11-27 MED ORDER — FENTANYL CITRATE (PF) 100 MCG/2ML IJ SOLN
50.0000 ug | Freq: Once | INTRAMUSCULAR | Status: AC
  Filled 2020-11-27: qty 1

## 2020-11-27 MED ORDER — CLONIDINE HCL (ANALGESIA) 100 MCG/ML EP SOLN
EPIDURAL | Status: DC | PRN
  Administered 2020-11-27: 50 ug
  Administered 2020-11-27: 30 ug

## 2020-11-27 SURGICAL SUPPLY — 63 items
BAG COUNTER SPONGE SURGICOUNT (BAG) ×2 IMPLANT
BANDAGE ESMARK 6X9 LF (GAUZE/BANDAGES/DRESSINGS) ×1 IMPLANT
BLADE LONG MED 31X9 (MISCELLANEOUS) IMPLANT
BLADE SAGITTAL (BLADE)
BLADE SAGITTAL 25.0X1.19X90 (BLADE) ×2 IMPLANT
BLADE SAW GIGLI 510 (BLADE) IMPLANT
BLADE SAW THK.89X75X18XSGTL (BLADE) IMPLANT
BLADE SURG 21 STRL SS (BLADE) ×2 IMPLANT
BNDG COHESIVE 6X5 TAN STRL LF (GAUZE/BANDAGES/DRESSINGS) ×2 IMPLANT
BNDG ELASTIC 4X5.8 VLCR STR LF (GAUZE/BANDAGES/DRESSINGS) IMPLANT
BNDG ELASTIC 6X5.8 VLCR STR LF (GAUZE/BANDAGES/DRESSINGS) IMPLANT
BNDG ESMARK 6X9 LF (GAUZE/BANDAGES/DRESSINGS) ×2
BNDG GAUZE ELAST 4 BULKY (GAUZE/BANDAGES/DRESSINGS) IMPLANT
CANISTER SUCT 3000ML PPV (MISCELLANEOUS) ×2 IMPLANT
CANISTER WOUNDNEG PRESSURE 500 (CANNISTER) ×2 IMPLANT
CHLORAPREP W/TINT 26 (MISCELLANEOUS) IMPLANT
CLIP VESOCCLUDE MED 6/CT (CLIP) IMPLANT
COVER SURGICAL LIGHT HANDLE (MISCELLANEOUS) ×2 IMPLANT
CUFF TOURN SGL QUICK 34 (TOURNIQUET CUFF) ×2
CUFF TRNQT CYL 34X4X40X1 (TOURNIQUET CUFF) ×1 IMPLANT
DRAIN CHANNEL 19F RND (DRAIN) IMPLANT
DRAPE DERMATAC (DRAPES) ×4 IMPLANT
DRAPE HALF SHEET 40X57 (DRAPES) ×2 IMPLANT
DRAPE INCISE IOBAN 66X45 STRL (DRAPES) ×2 IMPLANT
DRAPE ORTHO SPLIT 77X108 STRL (DRAPES) ×4
DRAPE SURG ORHT 6 SPLT 77X108 (DRAPES) ×2 IMPLANT
DRESSING PREVENA PLUS CUSTOM (GAUZE/BANDAGES/DRESSINGS) ×1 IMPLANT
DRSG PREVENA PLUS CUSTOM (GAUZE/BANDAGES/DRESSINGS) ×2
ELECT REM PT RETURN 9FT ADLT (ELECTROSURGICAL) ×2
ELECTRODE REM PT RTRN 9FT ADLT (ELECTROSURGICAL) ×1 IMPLANT
EVACUATOR SILICONE 100CC (DRAIN) IMPLANT
GAUZE SPONGE 4X4 12PLY STRL (GAUZE/BANDAGES/DRESSINGS) ×2 IMPLANT
GAUZE XEROFORM 5X9 LF (GAUZE/BANDAGES/DRESSINGS) ×2 IMPLANT
GLOVE SURG POLYISO LF SZ8 (GLOVE) ×2 IMPLANT
GLOVE SURG UNDER POLY LF SZ7.5 (GLOVE) ×2 IMPLANT
GOWN STRL REUS W/ TWL LRG LVL3 (GOWN DISPOSABLE) ×2 IMPLANT
GOWN STRL REUS W/ TWL XL LVL3 (GOWN DISPOSABLE) ×1 IMPLANT
GOWN STRL REUS W/TWL LRG LVL3 (GOWN DISPOSABLE) ×4
GOWN STRL REUS W/TWL XL LVL3 (GOWN DISPOSABLE) ×2
KIT BASIN OR (CUSTOM PROCEDURE TRAY) ×2 IMPLANT
KIT TURNOVER KIT B (KITS) ×2 IMPLANT
NS IRRIG 1000ML POUR BTL (IV SOLUTION) ×2 IMPLANT
PACK GENERAL/GYN (CUSTOM PROCEDURE TRAY) ×2 IMPLANT
PAD ARMBOARD 7.5X6 YLW CONV (MISCELLANEOUS) ×4 IMPLANT
PENCIL SMOKE EVACUATOR (MISCELLANEOUS) ×2 IMPLANT
PREVENA RESTOR ARTHOFORM 46X30 (CANNISTER) ×2 IMPLANT
SPONGE T-LAP 18X18 ~~LOC~~+RFID (SPONGE) ×4 IMPLANT
STAPLER SKIN 35 REG (STAPLE) ×2 IMPLANT
STAPLER VISISTAT 35W (STAPLE) ×4 IMPLANT
STOCKINETTE IMPERVIOUS LG (DRAPES) ×2 IMPLANT
SUT BONE WAX W31G (SUTURE) IMPLANT
SUT ETHILON 2 0 PSLX (SUTURE) IMPLANT
SUT ETHILON 3 0 PS 1 (SUTURE) IMPLANT
SUT SILK 0 TIES 10X30 (SUTURE) ×2 IMPLANT
SUT SILK 2 0 (SUTURE) ×2
SUT SILK 2 0 SH CR/8 (SUTURE) ×2 IMPLANT
SUT SILK 2-0 18XBRD TIE 12 (SUTURE) ×1 IMPLANT
SUT SILK 3 0 (SUTURE)
SUT SILK 3-0 18XBRD TIE 12 (SUTURE) IMPLANT
SUT VIC AB 2-0 CT1 18 (SUTURE) ×6 IMPLANT
TOWEL GREEN STERILE (TOWEL DISPOSABLE) ×4 IMPLANT
UNDERPAD 30X36 HEAVY ABSORB (UNDERPADS AND DIAPERS) ×4 IMPLANT
WATER STERILE IRR 1000ML POUR (IV SOLUTION) ×2 IMPLANT

## 2020-11-27 NOTE — Op Note (Signed)
DATE OF SERVICE: 11/27/2020  PATIENT:  Joel Perez  75 y.o. male  PRE-OPERATIVE DIAGNOSIS:  right foot osteomyeltitis without options for revascularization  POST-OPERATIVE DIAGNOSIS:  Same  PROCEDURE:   Right below knee amputation  SURGEON:  Surgeon(s) and Role:    * Cherre Robins, MD - Primary  ASSISTANT: Arlee Muslim, PA-C  An assistant was required to facilitate exposure and expedite the case.  ANESTHESIA:   regional and MAC  EBL: 12m  BLOOD ADMINISTERED:none  DRAINS: none   LOCAL MEDICATIONS USED:  NONE  SPECIMEN:  residual limb  COUNTS: confirmed correct.  TOURNIQUET:    Total Tourniquet Time Documented: Thigh (Right) - 11 minutes Total: Thigh (Right) - 11 minutes   PATIENT DISPOSITION:  PACU - hemodynamically stable.   Delay start of Pharmacological VTE agent (>24hrs) due to surgical blood loss or risk of bleeding: no  INDICATION FOR PROCEDURE: POSKAR CRETELLAis a 75y.o. male with osteomyelitis of the right foot with no options for limb salvage. After careful discussion of risks, benefits, and alternatives the patient was offered right below knee amputation. The patient understood and wished to proceed.  OPERATIVE FINDINGS: healthy margins for below knee amputation.   DESCRIPTION OF PROCEDURE: After identification of the patient in the pre-operative holding area, the patient was transferred to the operating room. The patient was positioned supine on the operating room table. Anesthesia was induced. The right leg was prepped and draped in standard fashion. A surgical pause was performed confirming correct patient, procedure, and operative location.  A sterile tourniquet was placed on the right thigh. The skin of the leg was marked to plan the anterior incision 10 cm distal to the tibial tuberosity and then the marked out a posterior flap that was one third of the circumference of the calf in length.   I then exsanguinated the leg with a Esmarch bandage and  then inflated the pneumatic tourniquet to 250 mm Hg. I made the incisions for these flaps, and then dissected through the subcutaneous tissue, fascia, and muscle anteriorly with electrocautery.  I also similarly developed a thick posterior flap of muscle with electrocautery.  I elevated  the periosteal tissue superiorly so that the tibia was about 3 cm shorter than the anterior skin flap.  I then transected the tibia with a power saw and then took a wedge off the tibia anteriorly with the power saw.  Then I smoothed out the rough edges with a rasp.  In a similar fashion, I cut back the fibula about two centimeters higher than the level of the tibia with a bone cutter.  I then finished releasing the posterior muscle flap with electrocautery.    At this point, the specimen was passed off the field as the below-the-knee amputation.  At this point, I clamped all visibly bleeding arteries and veins using a combination of suture ligation with Vicryl and electrocautery.  The tourniquet was then deflated at this point.  Bleeding continued to be controlled with electrocautery and suture ligature.  The stump was washed off with sterile normal saline and no further active bleeding was noted.    I reapproximated the anterior and posterior fascia  with interrupted stitches of 2-0 Vicryl.  This was completed along the entire length of anterior and posterior fascia until there were no more loose space in the fascial line.  The skin was then reapproximated with staples.  The stump was washed off and dried.    A Duda VAC system  and stump protector was applied.  Upon completion of the case instrument and sharps counts were confirmed correct. The patient was transferred to the PACU in good condition. I was present for all portions of the procedure.  [name]. Stanford Breed, MD [facility] Phone Number: [phone] 11/27/2020 3:55 PM

## 2020-11-27 NOTE — Anesthesia Procedure Notes (Signed)
Anesthesia Regional Block: Popliteal block   Pre-Anesthetic Checklist: , timeout performed,  Correct Patient, Correct Site, Correct Laterality,  Correct Procedure, Correct Position, site marked,  Risks and benefits discussed,  Surgical consent,  Pre-op evaluation,  At surgeon's request and post-op pain management  Laterality: Lower and Right  Prep: chloraprep       Needles:  Injection technique: Single-shot  Needle Type: Stimiplex     Needle Length: 10cm  Needle Gauge: 21     Additional Needles:   Procedures:,,,, ultrasound used (permanent image in chart),,   Motor weakness within 5 minutes.  Narrative:  Start time: 11/27/2020 1:41 PM End time: 11/27/2020 1:55 PM Injection made incrementally with aspirations every 5 mL.  Performed by: Personally  Anesthesiologist: Nolon Nations, MD  Additional Notes: Nerve located and needle positioned with direct ultrasound guidance. Good perineural spread. Patient tolerated well.

## 2020-11-27 NOTE — Anesthesia Procedure Notes (Signed)
Procedure Name: MAC Date/Time: 11/27/2020 2:15 PM Performed by: Griffin Dakin, CRNA Pre-anesthesia Checklist: Emergency Drugs available, Patient identified, Suction available, Timeout performed and Patient being monitored Patient Re-evaluated:Patient Re-evaluated prior to induction Oxygen Delivery Method: Simple face mask Induction Type: IV induction Placement Confirmation: positive ETCO2 and breath sounds checked- equal and bilateral Dental Injury: Teeth and Oropharynx as per pre-operative assessment

## 2020-11-27 NOTE — Progress Notes (Signed)
Orthopedic Tech Progress Note Patient Details:  YAIR DUSZA [birth year] [id number] BK Shrinker has been ordered from [place]  Patient ID: [name], male   DOB: 1946/02/21, 75 y.o.   MRN: [id number]  Jearld Lesch 11/27/2020, 5:08 PM

## 2020-11-27 NOTE — Plan of Care (Signed)
  Problem: Education: Goal: Knowledge of General Education information will improve Description: Including pain rating scale, medication(s)/side effects and non-pharmacologic comfort measures Outcome: Progressing   Problem: Health Behavior/Discharge Planning: Goal: Ability to manage health-related needs will improve Outcome: Progressing   Problem: Nutrition: Goal: Adequate nutrition will be maintained Outcome: Progressing   Problem: Pain Managment: Goal: General experience of comfort will improve Outcome: Progressing   Problem: Safety: Goal: Ability to remain free from injury will improve Outcome: Progressing   

## 2020-11-27 NOTE — Anesthesia Postprocedure Evaluation (Signed)
Anesthesia Post Note  Patient: Joel Perez  Procedure(s) Performed: RIGHT BELOW KNEE AMPUTATION (Right: Knee)     Patient location during evaluation: PACU Anesthesia Type: Regional Level of consciousness: awake and alert Pain management: pain level controlled Vital Signs Assessment: post-procedure vital signs reviewed and stable Respiratory status: spontaneous breathing Cardiovascular status: stable Anesthetic complications: no   No notable events documented.  Last Vitals:  Vitals:   11/27/20 1638 11/27/20 1700  BP: 120/61 106/69  Pulse: 80 83  Resp: 16 20  Temp: 36.7 C (!) 36.4 C  SpO2: 100% 99%    Last Pain:  Vitals:   11/27/20 1700  TempSrc: Oral  PainSc: 0-No pain                 Nolon Nations

## 2020-11-27 NOTE — Anesthesia Procedure Notes (Signed)
Anesthesia Regional Block: Adductor canal block   Pre-Anesthetic Checklist: , timeout performed,  Correct Patient, Correct Site, Correct Laterality,  Correct Procedure, Correct Position, site marked,  Risks and benefits discussed,  Surgical consent,  Pre-op evaluation,  At surgeon's request and post-op pain management  Laterality: Lower and Right  Prep: chloraprep       Needles:  Injection technique: Single-shot  Needle Type: Stimiplex     Needle Length: 9cm  Needle Gauge: 21     Additional Needles:   Procedures:,,,, ultrasound used (permanent image in chart),,    Narrative:  Start time: 11/27/2020 1:30 PM End time: 11/27/2020 1:41 PM Injection made incrementally with aspirations every 5 mL.  Performed by: Personally  Anesthesiologist: Nolon Nations, MD  Additional Notes: BP cuff, EKG monitors applied. Sedation begun. Artery and nerve location verified with ultrasound. Anesthetic injected incrementally (77ml), slowly, and after negative aspirations under direct u/s guidance. Good fascial/perineural spread. Tolerated well.

## 2020-11-27 NOTE — Progress Notes (Signed)
[facility] [place] PROGRESS NOTE  ASSESSMENT / PLAN: Joel Perez is a 75 y.o. male with non-salvageable right foot. Plan for right below knee amputation today in OR. All questions answered.   SUBJECTIVE: No interval changes.   OBJECTIVE: BP (!) 147/75   Pulse 93   Temp 97.6 F (36.4 C) (Oral)   Resp 20   Wt 103.3 kg   SpO2 96%   BMI 32.68 kg/m   Intake/Output Summary (Last 24 hours) at 11/27/2020 1326 Last data filed at 11/27/2020 1030 Gross per 24 hour  Intake 862.95 ml  Output 800 ml  Net 62.95 ml    No distress Regular rate and rhythm Unlabored R foot with large lateral foot wound  CBC Latest Ref Rng & Units 11/27/2020 11/26/2020 11/25/2020  WBC 4.0 - 10.5 K/uL 19.9(H) 18.4(H) 18.8(H)  Hemoglobin 13.0 - 17.0 g/dL 10.7(L) 10.3(L) 10.5(L)  Hematocrit 39.0 - 52.0 % 33.1(L) 32.6(L) 32.2(L)  Platelets 150 - 400 K/uL 509(H) 473(H) 500(H)     CMP Latest Ref Rng & Units 11/27/2020 11/26/2020 11/25/2020  Glucose 70 - 99 mg/dL 82 88 109(H)  BUN 8 - 23 mg/dL 13 15 22   Creatinine 0.61 - 1.24 mg/dL 0.75 0.75 0.83  Sodium 135 - 145 mmol/L 136 136 136  Potassium 3.5 - 5.1 mmol/L 4.4 4.1 3.6  Chloride 98 - 111 mmol/L 103 104 102  CO2 22 - 32 mmol/L 26 25 29   Calcium 8.9 - 10.3 mg/dL 8.1(L) 7.9(L) 8.0(L)  Total Protein 6.5 - 8.1 g/dL 5.1(L) 4.8(L) 4.8(L)  Total Bilirubin 0.3 - 1.2 mg/dL 0.5 0.6 0.6  Alkaline Phos 38 - 126 U/L 57 53 62  AST 15 - 41 U/L 29 32 33  ALT 0 - 44 U/L 12 19 25     Estimated Creatinine Clearance: 93.1 mL/min (by C-G formula based on SCr of 0.75 mg/dL).  Joel Perez. Stanford Breed, MD [facility] Phone Number: [phone] 11/27/2020 1:26 PM

## 2020-11-27 NOTE — Progress Notes (Signed)
Pharmacy Antibiotic Note  Joel Perez is a 75 y.o. male admitted on 11/23/2020 with pneumonia.  Pharmacy has been consulted for Unasyn dosing. \ ID: MSSA bacteremia/osteomyelitis of ray amputationof R foot. Had COVID around early June. Dc remdesivir - CT: CT chest = RLL pneumonia 1.5 spiculated RUL mass - Afeb. WBC 19.9 down; CRP 7.9 down, PCT 0.29  Ancef 7/4>7/7 Cefepime 7/3 >>7/4 Vancomycin 7/3 >>7/4 Remdesivir x1  7/5: BC x 2>> 7/3 blood>>MSSA COVID+  Plan: R BKA 7/7 PM. Unasyn 3g IV q6hr Pharmacy will sign off. Please reconsult for further dosing assitance.     Weight: 103.3 kg (227 lb 11.8 oz)  Temp (24hrs), Avg:97.9 F (36.6 C), Min:97.5 F (36.4 C), Max:98.3 F (36.8 C)  Recent Labs  Lab 11/23/20 1358 11/23/20 1538 11/24/20 0406 11/25/20 0308 11/26/20 0243 11/27/20 0445  WBC 25.0*  --  22.9* 18.8* 18.4* 19.9*  CREATININE 1.17  --  0.87 0.83 0.75 0.75  LATICACIDVEN 2.8* 2.2*  --   --   --   --     Estimated Creatinine Clearance: 93.1 mL/min (by C-G formula based on SCr of 0.75 mg/dL).    Allergies  Allergen Reactions   Other Swelling and Other (See Comments)    Farmed Fish (tightness in throat & lip swelling)   Codeine Rash   Sulfa Antibiotics Hives   [name] [name], PharmD, BCPS Clinical Staff Pharmacist [email or web address]  [name] 11/27/2020 8:21 AM

## 2020-11-27 NOTE — Anesthesia Preprocedure Evaluation (Addendum)
Anesthesia Evaluation  Patient identified by MRN, date of birth, ID band Patient awake    Reviewed: Allergy & Precautions, NPO status , Patient's Chart, lab work & pertinent test results  Airway Mallampati: II  TM Distance: >3 FB Neck ROM: Full    Dental  (+) Teeth Intact, Dental Advisory Given   Pulmonary pneumonia, resolved, COPD,  COPD inhaler and oxygen dependent, former smoker,     + decreased breath sounds+ wheezing      Cardiovascular hypertension, Pt. on medications + Peripheral Vascular Disease and + DOE  Normal cardiovascular exam+ Valvular Problems/Murmurs AI and MVP  Rhythm:Regular Rate:Normal     Neuro/Psych  Neuromuscular disease    GI/Hepatic negative GI ROS, (+)     substance abuse  alcohol use,   Endo/Other  Obesity   Renal/GU negative Renal ROS     Musculoskeletal negative musculoskeletal ROS (+) Osteomyelitis    Abdominal (+) + obese,   Peds  Hematology  (+) Blood dyscrasia, anemia ,   Anesthesia Other Findings Day of surgery medications reviewed with the patient.  Reproductive/Obstetrics                            Anesthesia Physical Anesthesia Plan  ASA: 3  Anesthesia Plan: Regional   Post-op Pain Management:    Induction: Intravenous  PONV Risk Score and Plan: Treatment may vary due to age or medical condition, Ondansetron, Midazolam, Propofol infusion and TIVA  Airway Management Planned:   Additional Equipment: None  Intra-op Plan:   Post-operative Plan:   Informed Consent: I have reviewed the patients History and Physical, chart, labs and discussed the procedure including the risks, benefits and alternatives for the proposed anesthesia with the patient or authorized representative who has indicated his/her understanding and acceptance.     Dental advisory given  Plan Discussed with: CRNA  Anesthesia Plan Comments:        Anesthesia Quick  Evaluation

## 2020-11-27 NOTE — Transfer of Care (Signed)
Immediate Anesthesia Transfer of Care Note  Patient: Joel Perez  Procedure(s) Performed: RIGHT BELOW KNEE AMPUTATION (Right: Knee)  Patient Location: PACU  Anesthesia Type:General  Level of Consciousness: awake, alert , oriented and patient cooperative  Airway & Oxygen Therapy: Patient Spontanous Breathing and Patient connected to nasal cannula oxygen  Post-op Assessment: Report given to RN and Post -op Vital signs reviewed and stable  Post vital signs: Reviewed and stable  Last Vitals:  Vitals Value Taken Time  BP 137/83 11/27/20 1552  Temp    Pulse 93 11/27/20 1553  Resp 13 11/27/20 1553  SpO2 95 % 11/27/20 1553  Vitals shown include unvalidated device data.  Last Pain:  Vitals:   11/27/20 1318  TempSrc: Oral  PainSc: 0-No pain      Patients Stated Pain Goal: 0 (97/28/20 6015)  Complications: No notable events documented.

## 2020-11-27 NOTE — Progress Notes (Signed)
PROGRESS NOTE   Joel Perez  TJQ:[id number] DOB: 1945-07-12 DOA: 11/23/2020 PCP: [name], MD  Brief Narrative:  75 year old white male community dwelling HFpEF LBBB  COPD +4 L at baseline follows with Dr. [name] Right fifth toe amputation by vascular surgery 08/28/2020 growing MSSA--went home on PICC line with Ancef until 5/22 and wound BMI 33 HFpEF 60-65%  Readmit from home after home health nurse noted wound is looking purulent-patient was feeling weak over the past 3 days also had been on prednisone On exam found to have foul-smelling infected lateral right foot x-ray consistent with osteomyelitis CT chest = RLL pneumonia 1.5 spiculated RUL mass WBC 25  Hospital-Problem based course  MSSA bs/p BKA 7/5 PM Blood culture X2 from 7/3 confirmed staph-surveillance cultures 7/5 neg so far Echocardiogram 7/6 no valvular vegetations ID to determine if needs TEE vs not and ABx duration Abx adjusted to unasyn as below R LL infiltrate?  Postobstructive CXR 7/7 possible PNA-[unlikelys--no sputum, no cough worse than prior]-will cover x 5 days and narrow per ID + subsequently RUL 1.5 spiculated mass Outpatient follow-up Dr. [name] Recent COVID-19 infection Treated--curently relatively asymptomatic HFpEF EF 60-65% Losartan 50 on hold Lasix 20 on hold Revatio 20 on hold Resume as OP ?  DM TY 2 Obesity BMI 32 Not on any prior to admission meds-sugars trending 870-100 [ was NPO] Discontinue sliding scale Attempt in the outpatient setting to de-escalate steroids and will CC Dr. [name] to coordinate this   DVT prophylaxis: lovenox Code Status: full Family Communication: wife gwendolyn [ph [phone]]at [facility]--updated fully Disposition:  Status is: Inpatient  Remains inpatient appropriate because:Hemodynamically unstable and Unsafe d/c plan  Dispo: The patient is from: Home              Anticipated d/c is to: SNF              Patient currently is not medically stable to d/c.    Difficult to place patient No       Consultants:  ortho  Procedures:   Antimicrobials:     Subjective:  Well Just back from surgery No distress - cough -cold- fever - chills No pain in stump  Objective: Vitals:   11/27/20 1608 11/27/20 1623 11/27/20 1638 11/27/20 1700  BP: 125/63 121/72 120/61 106/69  Pulse: 83 81 80 83  Resp: 15 16 16 20   Temp:  98.1 F (36.7 C) 98.1 F (36.7 C) (!) 97.5 F (36.4 C)  TempSrc:    Oral  SpO2: 99% 100% 100% 99%  Weight:        Intake/Output Summary (Last 24 hours) at 11/27/2020 1730 Last data filed at 11/27/2020 1706 Gross per 24 hour  Intake 1168.93 ml  Output 850 ml  Net 318.93 ml    Filed Weights   11/23/20 1524  Weight: 103.3 kg    Examination:  Eomi nca tno focal deficit Cta b  Abd soft No le edema Smile symm S1 s2 no m  Data Reviewed: personally reviewed   CBC    Component Value Date/Time   WBC 19.9 (H) 11/27/2020 0445   RBC 3.94 (L) 11/27/2020 0445   HGB 10.7 (L) 11/27/2020 0445   HCT 33.1 (L) 11/27/2020 0445   PLT 509 (H) 11/27/2020 0445   MCV 84.0 11/27/2020 0445   MCH 27.2 11/27/2020 0445   MCHC 32.3 11/27/2020 0445   RDW 16.9 (H) 11/27/2020 0445   LYMPHSABS 2.9 11/27/2020 0445   MONOABS 1.1 (H) 11/27/2020 0445   EOSABS  0.5 11/27/2020 0445   BASOSABS 0.1 11/27/2020 0445   CMP Latest Ref Rng & Units 11/27/2020 11/26/2020 11/25/2020  Glucose 70 - 99 mg/dL 82 88 109(H)  BUN 8 - 23 mg/dL 13 15 22   Creatinine 0.61 - 1.24 mg/dL 0.75 0.75 0.83  Sodium 135 - 145 mmol/L 136 136 136  Potassium 3.5 - 5.1 mmol/L 4.4 4.1 3.6  Chloride 98 - 111 mmol/L 103 104 102  CO2 22 - 32 mmol/L 26 25 29   Calcium 8.9 - 10.3 mg/dL 8.1(L) 7.9(L) 8.0(L)  Total Protein 6.5 - 8.1 g/dL 5.1(L) 4.8(L) 4.8(L)  Total Bilirubin 0.3 - 1.2 mg/dL 0.5 0.6 0.6  Alkaline Phos 38 - 126 U/L 57 53 62  AST 15 - 41 U/L 29 32 33  ALT 0 - 44 U/L 12 19 25      Radiology Studies: DG Chest 2 View  Result Date: 11/27/2020 CLINICAL DATA:   Pneumonia follow-up EXAM: CHEST - 2 VIEW COMPARISON:  11/23/2020 FINDINGS: Persistent opacity at the right lung base. Nodular opacities are identified in the right lung. Increased density at the left lung base. Small bilateral pleural effusions. Similar cardiomediastinal contours. No pneumothorax. IMPRESSION: Persistent opacities at the right lung base and right lung nodules. Increased left basilar atelectasis/consolidation. Small bilateral pleural effusions. Electronically Signed   By: Macy Mis M.D.   On: 11/27/2020 09:08   ECHOCARDIOGRAM COMPLETE  Result Date: 11/26/2020    ECHOCARDIOGRAM REPORT   Patient Name:   Joel Perez Date of Exam: 11/26/2020 Medical Rec #:  [id number]     Height:       70.0 in Accession #:    [id number]    Weight:       227.7 lb Date of Birth:  1946/04/06     BSA:          2.206 m Patient Age:    34 years      BP:           135/63 mmHg Patient Gender: M             HR:           92 bpm. Exam Location:  Inpatient Procedure: 2D Echo, Cardiac Doppler and Color Doppler Indications:    bacteremia  History:        Patient has prior history of Echocardiogram examinations, most                 recent 07/22/2020. Risk Factors:Former Smoker.  Sonographer:    Cammy Brochure Referring Phys: [id number] [place]  1. Left ventricular ejection fraction, by estimation, is 60 to 65%. The left ventricle has normal function. The left ventricle has no regional wall motion abnormalities. There is mild left ventricular hypertrophy. Left ventricular diastolic parameters were normal.  2. Right ventricular systolic function is normal. The right ventricular size is normal.  3. The mitral valve is normal in structure. Trivial mitral valve regurgitation. No evidence of mitral stenosis.  4. The aortic valve is calcified. There is moderate calcification of the aortic valve. There is moderate thickening of the aortic valve. Aortic valve regurgitation is trivial. Mild aortic valve stenosis.  5.  The inferior vena cava is normal in size with greater than 50% respiratory variability, suggesting right atrial pressure of 3 mmHg. Comparison(s): No significant change from prior study. Conclusion(s)/Recommendation(s): No evidence of valvular vegetations on this transthoracic echocardiogram. Would recommend a transesophageal echocardiogram to exclude infective endocarditis if clinically indicated. FINDINGS  Left Ventricle:  Left ventricular ejection fraction, by estimation, is 60 to 65%. The left ventricle has normal function. The left ventricle has no regional wall motion abnormalities. The left ventricular internal cavity size was normal in size. There is  mild left ventricular hypertrophy. Left ventricular diastolic parameters were normal. Right Ventricle: The right ventricular size is normal. No increase in right ventricular wall thickness. Right ventricular systolic function is normal. Left Atrium: Left atrial size was normal in size. Right Atrium: Right atrial size was normal in size. Pericardium: There is no evidence of pericardial effusion. Mitral Valve: The mitral valve is normal in structure. Trivial mitral valve regurgitation. No evidence of mitral valve stenosis. Tricuspid Valve: The tricuspid valve is normal in structure. Tricuspid valve regurgitation is trivial. No evidence of tricuspid stenosis. Aortic Valve: The aortic valve is calcified. There is moderate calcification of the aortic valve. There is moderate thickening of the aortic valve. Aortic valve regurgitation is trivial. Mild aortic stenosis is present. Aortic valve mean gradient measures 5.7 mmHg. Aortic valve peak gradient measures 10.2 mmHg. Aortic valve area, by VTI measures 2.80 cm. Pulmonic Valve: The pulmonic valve was not well visualized. Pulmonic valve regurgitation is not visualized. Aorta: The aortic root and ascending aorta are structurally normal, with no evidence of dilitation. Venous: The inferior vena cava is normal in size  with greater than 50% respiratory variability, suggesting right atrial pressure of 3 mmHg. IAS/Shunts: The atrial septum is grossly normal.  LEFT VENTRICLE PLAX 2D LVIDd:         4.50 cm LVIDs:         3.00 cm LV PW:         1.40 cm LV IVS:        0.90 cm LVOT diam:     2.10 cm LV SV:         75 LV SV Index:   34 LVOT Area:     3.46 cm  RIGHT VENTRICLE          IVC RV Basal diam:  3.50 cm  IVC diam: 0.70 cm LEFT ATRIUM             Index       RIGHT ATRIUM           Index LA diam:        3.00 cm 1.36 cm/m  RA Area:     11.50 cm LA Vol (A2C):   54.9 ml 24.89 ml/m RA Volume:   23.70 ml  10.74 ml/m LA Vol (A4C):   50.9 ml 23.08 ml/m LA Biplane Vol: 54.3 ml 24.62 ml/m  AORTIC VALVE AV Area (Vmax):    2.86 cm AV Area (Vmean):   2.82 cm AV Area (VTI):     2.80 cm AV Vmax:           159.33 cm/s AV Vmean:          110.000 cm/s AV VTI:            0.269 m AV Peak Grad:      10.2 mmHg AV Mean Grad:      5.7 mmHg LVOT Vmax:         131.67 cm/s LVOT Vmean:        89.567 cm/s LVOT VTI:          0.217 m LVOT/AV VTI ratio: 0.81  AORTA Ao Root diam: 3.30 cm Ao Asc diam:  2.90 cm  SHUNTS Systemic VTI:  0.22 m Systemic Diam: 2.10 cm [employer]  MD Electronically signed by Buford Dresser MD Signature Date/Time: 11/26/2020/2:45:08 PM    Final      Scheduled Meds:  (feeding supplement) PROSource Plus  30 mL Oral BID BM   vitamin C  500 mg Oral Daily   cholecalciferol  1,000 Units Oral Daily   cyclobenzaprine  10 mg Oral QHS   [START ON 11/28/2020] docusate sodium  100 mg Oral Daily   enoxaparin (LOVENOX) injection  40 mg Subcutaneous Q24H   feeding supplement  237 mL Oral TID BM   folic acid  1 mg Oral Daily   melatonin  10 mg Oral QHS   multivitamin with minerals  1 tablet Oral Daily   [START ON 11/28/2020] nutrition supplement (JUVEN)  1 packet Oral BID BM   pantoprazole  40 mg Oral Daily   predniSONE  20 mg Oral Q breakfast   saccharomyces boulardii  250 mg Oral BID   sodium chloride flush  3 mL  Intravenous Q12H   thiamine  100 mg Oral Daily   zinc sulfate  220 mg Oral Daily   Continuous Infusions:  sodium chloride Stopped (11/25/20 0658)   sodium chloride 75 mL/hr at 11/27/20 1701   ampicillin-sulbactam (UNASYN) IV 3 g (11/27/20 0903)   lactated ringers 50 mL/hr at 11/27/20 1410     LOS: 4 days   Time spent: 35  Nita Sells, MD [facility] To contact the attending provider between 7A-7P or the covering provider during after hours 7P-7A, please log into the web site [email or web address] and access using universal [place] password for that web site. If you do not have the password, please call the hospital operator.  11/27/2020, 5:30 PM

## 2020-11-28 ENCOUNTER — Inpatient Hospital Stay: Payer: Self-pay

## 2020-11-28 ENCOUNTER — Encounter (HOSPITAL_COMMUNITY): Payer: Self-pay | Admitting: Vascular Surgery

## 2020-11-28 DIAGNOSIS — M86172 Other acute osteomyelitis, left ankle and foot: Secondary | ICD-10-CM | POA: Diagnosis not present

## 2020-11-28 DIAGNOSIS — J9611 Chronic respiratory failure with hypoxia: Secondary | ICD-10-CM | POA: Diagnosis not present

## 2020-11-28 DIAGNOSIS — J189 Pneumonia, unspecified organism: Secondary | ICD-10-CM

## 2020-11-28 DIAGNOSIS — L089 Local infection of the skin and subcutaneous tissue, unspecified: Secondary | ICD-10-CM | POA: Diagnosis not present

## 2020-11-28 DIAGNOSIS — R7881 Bacteremia: Secondary | ICD-10-CM | POA: Diagnosis not present

## 2020-11-28 DIAGNOSIS — B9561 Methicillin susceptible Staphylococcus aureus infection as the cause of diseases classified elsewhere: Secondary | ICD-10-CM | POA: Diagnosis not present

## 2020-11-28 LAB — COMPREHENSIVE METABOLIC PANEL
ALT: 11 U/L (ref 0–44)
AST: 34 U/L (ref 15–41)
Albumin: 1.9 g/dL — ABNORMAL LOW (ref 3.5–5.0)
Alkaline Phosphatase: 53 U/L (ref 38–126)
Anion gap: 5 (ref 5–15)
BUN: 12 mg/dL (ref 8–23)
CO2: 30 mmol/L (ref 22–32)
Calcium: 8.1 mg/dL — ABNORMAL LOW (ref 8.9–10.3)
Chloride: 101 mmol/L (ref 98–111)
Creatinine, Ser: 0.78 mg/dL (ref 0.61–1.24)
GFR, Estimated: >60 mL/min (ref >60)
Glucose, Bld: 115 mg/dL — ABNORMAL HIGH (ref 70–99)
Potassium: 4.5 mmol/L (ref 3.5–5.1)
Sodium: 136 mmol/L (ref 135–145)
Total Bilirubin: 0.5 mg/dL (ref 0.3–1.2)
Total Protein: 5.1 g/dL — ABNORMAL LOW (ref 6.5–8.1)

## 2020-11-28 LAB — GLUCOSE, CAPILLARY
Glucose-Capillary: 118 mg/dL — ABNORMAL HIGH (ref 70–99)
Glucose-Capillary: 118 mg/dL — ABNORMAL HIGH (ref 70–99)
Glucose-Capillary: 161 mg/dL — ABNORMAL HIGH (ref 70–99)
Glucose-Capillary: 162 mg/dL — ABNORMAL HIGH (ref 70–99)

## 2020-11-28 LAB — CBC WITH DIFFERENTIAL/PLATELET
Abs Immature Granulocytes: 1.04 10*3/uL — ABNORMAL HIGH (ref 0.00–0.07)
Basophils Absolute: 0.1 10*3/uL (ref 0.0–0.1)
Basophils Relative: 0 %
Eosinophils Absolute: 0 10*3/uL (ref 0.0–0.5)
Eosinophils Relative: 0 %
HCT: 32.7 % — ABNORMAL LOW (ref 39.0–52.0)
Hemoglobin: 10.3 g/dL — ABNORMAL LOW (ref 13.0–17.0)
Immature Granulocytes: 5 %
Lymphocytes Relative: 7 %
Lymphs Abs: 1.7 10*3/uL (ref 0.7–4.0)
MCH: 26.9 pg (ref 26.0–34.0)
MCHC: 31.5 g/dL (ref 30.0–36.0)
MCV: 85.4 fL (ref 80.0–100.0)
Monocytes Absolute: 0.7 10*3/uL (ref 0.1–1.0)
Monocytes Relative: 3 %
Neutro Abs: 19.9 10*3/uL — ABNORMAL HIGH (ref 1.7–7.7)
Neutrophils Relative %: 85 %
Platelets: 510 10*3/uL — ABNORMAL HIGH (ref 150–400)
RBC: 3.83 MIL/uL — ABNORMAL LOW (ref 4.22–5.81)
RDW: 16.8 % — ABNORMAL HIGH (ref 11.5–15.5)
WBC: 23.3 10*3/uL — ABNORMAL HIGH (ref 4.0–10.5)
nRBC: 0 % (ref 0.0–0.2)

## 2020-11-28 LAB — CULTURE, BLOOD (ROUTINE X 2)
Culture: NO GROWTH
Culture: NO GROWTH

## 2020-11-28 LAB — C-REACTIVE PROTEIN: CRP: 5.1 mg/dL — ABNORMAL HIGH (ref <1.0)

## 2020-11-28 MED ORDER — CEFAZOLIN SODIUM-DEXTROSE 2-4 GM/100ML-% IV SOLN
2.0000 g | Freq: Three times a day (TID) | INTRAVENOUS | Status: DC
  Administered 2020-12-02 – 2020-12-09 (×23): 2 g via INTRAVENOUS
  Filled 2020-11-28 (×25): qty 100

## 2020-11-28 NOTE — Evaluation (Addendum)
Occupational Therapy Evaluation Patient Details Name: Joel Perez MRN: [id number] DOB: 01-Aug-1945 Today's Date: 11/28/2020    History of Present Illness pt is a 75 y/o male admitted 7/7 with worsening weakness and R foot shown to have frank purulence.  R foot found to have osteomyelitis.  Pt has no furthe options for revascularization.  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  PMHx:  neuropathy, HTN, COPD, blindness R eye   Clinical Impression    Pt. Is motivated to work with therapy to increase I and safety with ADLs and mobility. Pt. Wife is available to care for him at home but he needs to be at a higher level of function. Pt. Is an agreement for OT to continue.   Follow Up Recommendations   SNF    Equipment Recommendations  None recommended by OT    Recommendations for Other Services       Precautions / Restrictions Precautions Precautions: Fall Restrictions Weight Bearing Restrictions: Yes RLE Weight Bearing: Non weight bearing      Mobility Bed Mobility Overal bed mobility: Needs Assistance Bed Mobility: Supine to Sit;Sit to Supine     Supine to sit: Mod assist Sit to supine: Mod assist   General bed mobility comments: cued for bridging to EOB, assisting bridging with assist moving LE and use of the pad.  assisted trunk up and forward via R elbow.    Transfers   Equipment used: None Transfers: Optician, dispensing Transfers: Mod assist General transfer comment: pt deferred OOB to chair or attempts to stand.  Cued for scoot transfer up toward [facility] and assisted pt with pad.    Balance Overall balance assessment: Needs assistance Sitting-balance support: No upper extremity supported;Feet supported;Feet unsupported Sitting balance-Leahy Scale: Fair Sitting balance - Comments: falls posteriorly with more than minimal challenge                                   ADL either performed or assessed with clinical judgement   ADL Overall ADL's :  Needs assistance/impaired Eating/Feeding: Independent   Grooming: Wash/dry hands;Wash/dry face;Set up   Upper Body Bathing: Minimal assistance;Sitting   Lower Body Bathing: Maximal assistance;Bed level   Upper Body Dressing : Minimal assistance;Sitting   Lower Body Dressing: Total assistance;Bed level               Functional mobility during ADLs:  (unable to stand) General ADL Comments: bed level adls. Pt. is not able to stand currently     Vision Baseline Vision/History:  (blind in r eye)       Perception     Praxis      Pertinent Vitals/Pain Pain Assessment: 0-10 Pain Score: 6  Faces Pain Scale: Hurts even more Pain Location: R stump Pain Descriptors / Indicators: Sore;Tender Pain Intervention(s): Monitored during session     Hand Dominance Right   Extremity/Trunk Assessment Upper Extremity Assessment Upper Extremity Assessment: Generalized weakness   Lower Extremity Assessment Lower Extremity Assessment: Defer to PT evaluation RLE Deficits / Details: moves weakly against gravity, knee flexion to approp 60* RLE Coordination: decreased fine motor LLE Deficits / Details: grossly 4/5, stiff, mildly swollen LLE Coordination: decreased fine motor   Cervical / Trunk Assessment Cervical / Trunk Assessment: Kyphotic   Communication Communication Communication: No difficulties   Cognition Arousal/Alertness: Awake/alert Behavior During Therapy: Flat affect Overall Cognitive Status: Within Functional Limits  for tasks assessed                                     General Comments  4 L of O2. wears at home as well    Exercises Exercises: Other exercises;Amputee Other Exercises Other Exercises: bil LE AAROM hip/knee flexion /ext.  Specifically worked on knee flexion.   Shoulder Instructions      Home Living Family/patient expects to be discharged to:: Private residence Living Arrangements: Spouse/significant other Available Help at  Discharge: Family;Available 24 hours/day Type of Home: Mobile home Home Access: Ramped entrance     Home Layout: One level     Bathroom Shower/Tub: Occupational psychologist: Handicapped height Bathroom Accessibility: Yes   Home Equipment: Environmental consultant - 2 wheels;Walker - standard;Shower seat - built in;Grab bars - tub/shower;Grab bars - toilet;Bedside commode   Additional Comments: Pt. wife state the built in shower seat is too small and they will purchase a shower seat.      Prior Functioning/Environment Level of Independence: Needs assistance        Comments: prior to recent admission, household ambulator using standard walker, lately walking on shorter distance of 20-30 feet:, was community ambulator driving 2 months ago, on home O2 4 LPM constant.  continued to use walker in the home up until the last week.        OT Problem List: Decreased strength;Decreased activity tolerance;Impaired balance (sitting and/or standing);Decreased knowledge of use of DME or AE;Decreased knowledge of precautions      OT Treatment/Interventions: Self-care/ADL training;DME and/or AE instruction;Therapeutic activities    OT Goals(Current goals can be found in the care plan section) Acute Rehab OT Goals Patient Stated Goal: get stronger OT Goal Formulation: With patient Time For Goal Achievement: 12/12/20 Potential to Achieve Goals: Good ADL Goals Pt Will Perform Grooming: with modified independence;sitting Pt Will Perform Upper Body Bathing: with set-up;sitting Pt Will Perform Lower Body Bathing: with min assist;sitting/lateral leans Pt Will Perform Upper Body Dressing: with set-up;sitting Pt Will Perform Lower Body Dressing: with min assist;sitting/lateral leans Pt Will Transfer to Toilet: with min assist;squat pivot transfer Pt Will Perform Toileting - Clothing Manipulation and hygiene: with min assist;sitting/lateral leans  OT Frequency: Min 2X/week   Barriers to D/C:  (Pt. has 24  hour care but wife is not able to care for him at this level.)          Co-evaluation              AM-PAC OT "6 Clicks" Daily Activity     Outcome Measure Help from another person eating meals?: A Little Help from another person taking care of personal grooming?: A Little Help from another person toileting, which includes using toliet, bedpan, or urinal?: Total Help from another person bathing (including washing, rinsing, drying)?: A Lot Help from another person to put on and taking off regular upper body clothing?: A Little Help from another person to put on and taking off regular lower body clothing?: Total 6 Click Score: 13   End of Session Nurse Communication:  (ok therapy)  Activity Tolerance: Patient tolerated treatment well Patient left: in bed  OT Visit Diagnosis: Unsteadiness on feet (R26.81);Other abnormalities of gait and mobility (R26.89);Muscle weakness (generalized) (M62.81)                Time: 9379-0240 OT Time Calculation (min): 44 min Charges:  OT General Charges $  OT Visit: 1 Visit OT Evaluation $OT Eval Moderate Complexity: 1 Mod OT Treatments $Self Care/Home Management : 8-22 mins  Reece Packer OT/L   Joel Perez 11/28/2020, 12:26 PM

## 2020-11-28 NOTE — Progress Notes (Signed)
[facility]  Date of Admission:  11/23/2020     Total days of antibiotics 6         ASSESSMENT:  Mr. 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 Able to eat a little more today. Blood cultures from 7/5 have remained without growth and okay for PICC placement. Given his respiratory status there is concern about pursuing a TEE and will plan for treatment with 6 weeks of Cefazolin. For now continue Unasyn through 7/11 and then transition back to Cefazolin for remainder of treatment through 8/18. Home Health and OPAT orders placed. Disposition appears to be CIR. Will continue to monitor cultures peripherally until finalized and arrange follow up in ID office.   PLAN:  Continue Unasyn through 7/12 then transition to Cefazolin. PICC line prior to discharge.  Wound care per Vascular Surgery.  Home Health and OPAT orders. Follow up in ID clinic.   ID will sign off. Please re-consult if needed.   Diagnosis: MSSA Bacteremia and right foot osteomyelitis s/p BKA  Culture Result: MSSA  Allergies  Allergen Reactions   Other Swelling and Other (See Comments)    Farmed Fish (tightness in throat & lip swelling)   Codeine Rash   Sulfa Antibiotics Hives    OPAT Orders Discharge antibiotics to be given via PICC line Discharge antibiotics: Cefazolin  Per pharmacy protocol   Duration: 6 weeks End Date: 01/08/21   [facility] Care Per Protocol:  Home health RN for IV administration and teaching; PICC line care and labs.    Labs weekly while on IV antibiotics: _X_ CBC with differential _X_ BMP __ CMP _X_ CRP _X  ESR __ Vancomycin trough __ CK  _X_ Please pull PIC at completion of IV antibiotics __ Please leave PIC in place until doctor has seen patient or been notified  Fax weekly labs to [phone]  Clinic Follow Up Appt:  12/26/20 at 9 am with Dr. Juleen China     Principal Problem:   MSSA bacteremia Active  Problems:   Neuropathy, alcoholic ([place])   Chronic respiratory failure with hypoxia (HCC)   PVD (peripheral vascular disease) ([place])   Multiple pulmonary nodules determined by computed tomography of lung   Osteomyelitis of ankle or foot, acute, left (HCC)   RLL pneumonia    (feeding supplement) PROSource Plus  30 mL Oral BID BM   vitamin C  500 mg Oral Daily   cholecalciferol  1,000 Units Oral Daily   cyclobenzaprine  10 mg Oral QHS   docusate sodium  100 mg Oral Daily   enoxaparin (LOVENOX) injection  40 mg Subcutaneous Q24H   feeding supplement  237 mL Oral TID BM   folic acid  1 mg Oral Daily   melatonin  10 mg Oral QHS   multivitamin with minerals  1 tablet Oral Daily   nutrition supplement (JUVEN)  1 packet Oral BID BM   pantoprazole  40 mg Oral Daily   predniSONE  20 mg Oral Q breakfast   saccharomyces boulardii  250 mg Oral BID   sodium chloride flush  3 mL Intravenous Q12H   thiamine  100 mg Oral Daily   zinc sulfate  220 mg Oral Daily    SUBJECTIVE:  Afebrile overnight with no acute events. Feeling a little better today. Wonders if previous wound care and antibiotics was enough. Denies fevers and chills.   Allergies  Allergen Reactions   Other Swelling and Other (  See Comments)    Farmed Fish (tightness in throat & lip swelling)   Codeine Rash   Sulfa Antibiotics Hives     Review of Systems: Review of Systems  Constitutional:  Negative for chills, fever and weight loss.  Respiratory:  Negative for cough, shortness of breath and wheezing.   Cardiovascular:  Negative for chest pain and leg swelling.  Gastrointestinal:  Negative for abdominal pain, constipation, diarrhea, nausea and vomiting.  Skin:  Negative for rash.     OBJECTIVE: Vitals:   11/28/20 0232 11/28/20 0233 11/28/20 0544 11/28/20 0545  BP: 119/71 119/71 (!) 171/75 140/87  Pulse: 88 88 91 90  Resp: (!) 22 (!) 22  (!) 22  Temp:  97.6 F (36.4 C)  97.9 F (36.6 C)  TempSrc:  Oral    SpO2: 98%  98% 98% 98%  Weight:       Body mass index is 32.68 kg/m.  Physical Exam Constitutional:      General: He is not in acute distress.    Appearance: He is well-developed.  Cardiovascular:     Rate and Rhythm: Normal rate and regular rhythm.     Heart sounds: Normal heart sounds.  Pulmonary:     Effort: Pulmonary effort is normal.     Breath sounds: Normal breath sounds.  Skin:    General: Skin is warm and dry.  Neurological:     Mental Status: He is alert and oriented to person, place, and time.    Lab Results Lab Results  Component Value Date   WBC 23.3 (H) 11/28/2020   HGB 10.3 (L) 11/28/2020   HCT 32.7 (L) 11/28/2020   MCV 85.4 11/28/2020   PLT 510 (H) 11/28/2020    Lab Results  Component Value Date   CREATININE 0.78 11/28/2020   BUN 12 11/28/2020   NA 136 11/28/2020   K 4.5 11/28/2020   CL 101 11/28/2020   CO2 30 11/28/2020    Lab Results  Component Value Date   ALT 11 11/28/2020   AST 34 11/28/2020   ALKPHOS 53 11/28/2020   BILITOT 0.5 11/28/2020     Microbiology: Recent Results (from the past 240 hour(s))  Blood culture (routine single)     Status: Abnormal   Collection Time: 11/23/20  1:58 PM   Specimen: BLOOD RIGHT HAND  Result Value Ref Range Status   Specimen Description BLOOD RIGHT HAND  Final   Special Requests   Final    BOTTLES DRAWN AEROBIC AND ANAEROBIC Blood Culture adequate volume   Culture  Setup Time   Final    GRAM POSITIVE COCCI IN CLUSTERS AEROBIC BOTTLE ONLY CRITICAL RESULT CALLED TO, READ BACK BY AND VERIFIED WITH: Lanae Boast PHARMD 2011 11/24/20 A BROWNING Performed at [facility], [address] [place], [place] [address]    Culture STAPHYLOCOCCUS AUREUS (A)  Final   Report Status 11/26/2020 FINAL  Final   Organism ID, Bacteria STAPHYLOCOCCUS AUREUS  Final      Susceptibility   Staphylococcus aureus - MIC*    CIPROFLOXACIN <=0.5 SENSITIVE Sensitive     ERYTHROMYCIN >=8 RESISTANT Resistant     GENTAMICIN <=0.5 SENSITIVE  Sensitive     OXACILLIN <=0.25 SENSITIVE Sensitive     TETRACYCLINE <=1 SENSITIVE Sensitive     VANCOMYCIN <=0.5 SENSITIVE Sensitive     TRIMETH/SULFA <=10 SENSITIVE Sensitive     CLINDAMYCIN >=8 RESISTANT Resistant     RIFAMPIN <=0.5 SENSITIVE Sensitive     Inducible Clindamycin NEGATIVE  Sensitive     * STAPHYLOCOCCUS AUREUS  Blood Culture ID Panel (Reflexed)     Status: Abnormal   Collection Time: 11/23/20  1:58 PM  Result Value Ref Range Status   Enterococcus faecalis NOT DETECTED NOT DETECTED Final   Enterococcus Faecium NOT DETECTED NOT DETECTED Final   Listeria monocytogenes NOT DETECTED NOT DETECTED Final   Staphylococcus species DETECTED (A) NOT DETECTED Final    Comment: CRITICAL RESULT CALLED TO, READ BACK BY AND VERIFIED WITH: K HURTH PHARMD 2011 11/24/20 A BROWNING    Staphylococcus aureus (BCID) DETECTED (A) NOT DETECTED Final    Comment: CRITICAL RESULT CALLED TO, READ BACK BY AND VERIFIED WITH: Lanae Boast PHARMD 2011 11/24/20 A BROWNING    Staphylococcus epidermidis NOT DETECTED NOT DETECTED Final   Staphylococcus lugdunensis NOT DETECTED NOT DETECTED Final   Streptococcus species NOT DETECTED NOT DETECTED Final   Streptococcus agalactiae NOT DETECTED NOT DETECTED Final   Streptococcus pneumoniae NOT DETECTED NOT DETECTED Final   Streptococcus pyogenes NOT DETECTED NOT DETECTED Final   A.calcoaceticus-baumannii NOT DETECTED NOT DETECTED Final   Bacteroides fragilis NOT DETECTED NOT DETECTED Final   Enterobacterales NOT DETECTED NOT DETECTED Final   Enterobacter cloacae complex NOT DETECTED NOT DETECTED Final   Escherichia coli NOT DETECTED NOT DETECTED Final   Klebsiella aerogenes NOT DETECTED NOT DETECTED Final   Klebsiella oxytoca NOT DETECTED NOT DETECTED Final   Klebsiella pneumoniae NOT DETECTED NOT DETECTED Final   Proteus species NOT DETECTED NOT DETECTED Final   Salmonella species NOT DETECTED NOT DETECTED Final   Serratia marcescens NOT DETECTED NOT DETECTED  Final   Haemophilus influenzae NOT DETECTED NOT DETECTED Final   Neisseria meningitidis NOT DETECTED NOT DETECTED Final   Pseudomonas aeruginosa NOT DETECTED NOT DETECTED Final   Stenotrophomonas maltophilia NOT DETECTED NOT DETECTED Final   Candida albicans NOT DETECTED NOT DETECTED Final   Candida auris NOT DETECTED NOT DETECTED Final   Candida glabrata NOT DETECTED NOT DETECTED Final   Candida krusei NOT DETECTED NOT DETECTED Final   Candida parapsilosis NOT DETECTED NOT DETECTED Final   Candida tropicalis NOT DETECTED NOT DETECTED Final   Cryptococcus neoformans/gattii NOT DETECTED NOT DETECTED Final   Meth resistant mecA/C and MREJ NOT DETECTED NOT DETECTED Final    Comment: Performed at [facility], [address], [place], [place] [address]  Culture, blood (routine x 2)     Status: None   Collection Time: 11/23/20  3:39 PM   Specimen: BLOOD  Result Value Ref Range Status   Specimen Description BLOOD SITE NOT SPECIFIED  Final   Special Requests   Final    BOTTLES DRAWN AEROBIC AND ANAEROBIC Blood Culture results may not be optimal due to an excessive volume of blood received in culture bottles   Culture   Final    NO GROWTH 5 DAYS Performed at [facility], [place] [address] [place], [place] [address]    Report Status 11/28/2020 FINAL  Final  Culture, blood (routine x 2)     Status: None   Collection Time: 11/23/20  3:57 PM   Specimen: BLOOD  Result Value Ref Range Status   Specimen Description BLOOD SITE NOT SPECIFIED  Final   Special Requests   Final    BOTTLES DRAWN AEROBIC AND ANAEROBIC Blood Culture results may not be optimal due to an inadequate volume of blood received in culture bottles   Culture   Final    NO GROWTH 5 DAYS Performed  at [facility], [place] [address] [place], [place] [address]    Report Status 11/28/2020 FINAL  Final  Resp Panel by RT-PCR (Flu A&B, Covid) Nasopharyngeal Swab     Status: Abnormal   Collection Time: 11/23/20  7:27 PM    Specimen: Nasopharyngeal Swab; Nasopharyngeal(NP) swabs in vial transport medium  Result Value Ref Range Status   SARS Coronavirus 2 by RT PCR POSITIVE (A) NEGATIVE Final    Comment: RESULT CALLED TO, READ BACK BY AND VERIFIED WITH: RN BOKIAGON C. BY MESSAN H. AT 2055 ON 11/23/2020 (NOTE) SARS-CoV-2 target nucleic acids are DETECTED.  The SARS-CoV-2 RNA is generally detectable in upper respiratory specimens during the acute phase of infection. Positive results are indicative of the presence of the identified virus, but do not rule out bacterial infection or co-infection with other pathogens not detected by the test. Clinical correlation with patient history and other diagnostic information is necessary to determine patient infection status. The expected result is Negative.  Fact Sheet for Patients: EntrepreneurPulse.com.au  Fact Sheet for Healthcare Providers: IncredibleEmployment.be  This test is not yet approved or cleared by the [place] FDA and  has been authorized for detection and/or diagnosis of SARS-CoV-2 by FDA under an Emergency Use Authorization (EUA).  This EUA will remain in effect (meaning th is test can be used) for the duration of  the COVID-19 declaration under Section 564(b)(1) of the Act, 21 U.S.C. section 360bbb-3(b)(1), unless the authorization is terminated or revoked sooner.     Influenza A by PCR NEGATIVE NEGATIVE Final   Influenza B by PCR NEGATIVE NEGATIVE Final    Comment: (NOTE) The Xpert Xpress SARS-CoV-2/FLU/RSV plus assay is intended as an aid in the diagnosis of influenza from Nasopharyngeal swab specimens and should not be used as a sole basis for treatment. Nasal washings and aspirates are unacceptable for Xpert Xpress SARS-CoV-2/FLU/RSV testing.  Fact Sheet for Patients: EntrepreneurPulse.com.au  Fact Sheet for Healthcare Providers: IncredibleEmployment.be  This  test is not yet approved or cleared by the [place] FDA and has been authorized for detection and/or diagnosis of SARS-CoV-2 by FDA under an Emergency Use Authorization (EUA). This EUA will remain in effect (meaning this test can be used) for the duration of the COVID-19 declaration under Section 564(b)(1) of the Act, 21 U.S.C. section 360bbb-3(b)(1), unless the authorization is terminated or revoked.  Performed at [facility], [place] [address] [place], [place] [address]   Culture, blood (routine x 2)     Status: None (Preliminary result)   Collection Time: 11/25/20 12:46 PM   Specimen: BLOOD LEFT WRIST  Result Value Ref Range Status   Specimen Description BLOOD LEFT WRIST  Final   Special Requests   Final    BOTTLES DRAWN AEROBIC AND ANAEROBIC Blood Culture adequate volume   Culture   Final    NO GROWTH 3 DAYS Performed at [facility], [place] [address] [place], [place] [address]    Report Status PENDING  Incomplete  Culture, blood (routine x 2)     Status: None (Preliminary result)   Collection Time: 11/25/20 12:48 PM   Specimen: BLOOD  Result Value Ref Range Status   Specimen Description BLOOD LEFT ANTECUBITAL  Final   Special Requests   Final    BOTTLES DRAWN AEROBIC AND ANAEROBIC Blood Culture adequate volume   Culture   Final    NO GROWTH 3 DAYS Performed at [facility], [place] [address], [place], [place]  [address]    Report Status PENDING  Incomplete     Terri Piedra, NP [facility]  11/28/2020  11:10 AM

## 2020-11-28 NOTE — Progress Notes (Signed)
PICC placement was ordered for IV ABT. Pt signed consent but wants to have picc placed in the morning.

## 2020-11-28 NOTE — Evaluation (Signed)
Physical Therapy Evaluation Patient Details Name: Joel Perez MRN: [id number] DOB: [birth year] Today's Date: 11/28/2020   History of Present Illness  pt is a 75 y/o male admitted 7/7 with worsening weakness and R foot shown to have frank purulence.  R foot found to have osteomyelitis.  Pt has no furthe options for revascularization.  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  PMHx:  neuropathy, HTN, COPD, blindness R eye  Clinical Impression  Pt admitted with/for general weakness and R foot osteo.  Pt s/p R BKA.  Pt is now needing moderate assist for basic bed mobility/transfers..  Pt currently limited functionally due to the problems listed. ( See problems list.)   Pt will benefit from PT to maximize function and safety in order to get ready for next venue listed below.     Follow Up Recommendations CIR;Other (comment)    Equipment Recommendations  3in1 (PT);Other (comment);Wheelchair (measurements PT);Wheelchair cushion (measurements PT) (tub or shower seat)    Recommendations for Other Services Rehab consult     Precautions / Restrictions Precautions Precautions: Fall Restrictions RLE Weight Bearing: Non weight bearing      Mobility  Bed Mobility Overal bed mobility: Needs Assistance Bed Mobility: Supine to Sit;Sit to Supine     Supine to sit: Mod assist Sit to supine: Mod assist   General bed mobility comments: cued for bridging to EOB, assisting bridging with assist moving LE and use of the pad.  assisted trunk up and forward via R elbow.    Transfers   Equipment used: None Transfers: Optician, dispensing Transfers: Mod assist General transfer comment: pt deferred OOB to chair or attempts to stand.  Cued for scoot transfer up toward [facility] and assisted pt with pad.  Ambulation/Gait                Stairs            Wheelchair Mobility    Modified Rankin (Stroke Patients Only)       Balance Overall balance assessment: Needs  assistance Sitting-balance support: No upper extremity supported;Feet supported;Feet unsupported Sitting balance-Leahy Scale: Fair Sitting balance - Comments: falls posteriorly with more than minimal challenge                                     Pertinent Vitals/Pain Pain Assessment: Faces Faces Pain Scale: Hurts even more Pain Location: R stump Pain Descriptors / Indicators: Sore;Tender Pain Intervention(s): Monitored during session;Limited activity within patient's tolerance;Patient requesting pain meds-RN notified    Home Living Family/patient expects to be discharged to:: Private residence Living Arrangements: Spouse/significant other Available Help at Discharge: Family;Available 24 hours/day Type of Home: Mobile home Home Access: Ramped entrance     Home Layout: One level Home Equipment: [place] - 2 wheels;Walker - standard;Shower seat - built in;Grab bars - tub/shower;Grab bars - toilet      Prior Function Level of Independence: Needs assistance         Comments: prior to recent admission, household ambulator using standard walker, lately walking on shorter distance of 20-30 feet:, was community ambulator driving 2 months ago, on home O2 4 LPM constant.  continued to use walker in the home up until the last week.     Hand Dominance   Dominant Hand: Right    Extremity/Trunk Assessment   Upper Extremity Assessment Upper Extremity Assessment: Generalized weakness  Lower Extremity Assessment Lower Extremity Assessment: Generalized weakness;RLE deficits/detail;LLE deficits/detail RLE Deficits / Details: moves weakly against gravity, knee flexion to approp 60* RLE Coordination: decreased fine motor LLE Deficits / Details: grossly 4/5, stiff, mildly swollen LLE Coordination: decreased fine motor    Cervical / Trunk Assessment Cervical / Trunk Assessment: Kyphotic  Communication   Communication: No difficulties  Cognition Arousal/Alertness:  Awake/alert Behavior During Therapy: Flat affect Overall Cognitive Status:  (NT formally, reluctant to engage in questions.)                                        General Comments General comments (skin integrity, edema, etc.): vss on 4L [place], though pt was visibly fatigued.    Exercises Other Exercises Other Exercises: bil LE AAROM hip/knee flexion /ext.  Specifically worked on knee flexion.   Assessment/Plan    PT Assessment Patient needs continued PT services  PT Problem List Decreased strength;Decreased activity tolerance;Decreased balance;Decreased mobility;Pain;Decreased knowledge of precautions;Cardiopulmonary status limiting activity;Decreased knowledge of use of DME       PT Treatment Interventions DME instruction;Gait training;Functional mobility training;Therapeutic activities;Balance training;Patient/family education    PT Goals (Current goals can be found in the Care Plan section)  Acute Rehab PT Goals Patient Stated Goal: pt agreed home as soon as possible, as able as possible and get prosthesis PT Goal Formulation: With patient Time For Goal Achievement: 12/12/20 Potential to Achieve Goals: Good    Frequency Min 4X/week   Barriers to discharge        Co-evaluation               AM-PAC PT "6 Clicks" Mobility  Outcome Measure Help needed turning from your back to your side while in a flat bed without using bedrails?: A Little Help needed moving from lying on your back to sitting on the side of a flat bed without using bedrails?: A Little Help needed moving to and from a bed to a chair (including a wheelchair)?: Total Help needed standing up from a chair using your arms (e.g., wheelchair or bedside chair)?: Total Help needed to walk in hospital room?: Total Help needed climbing 3-5 steps with a railing? : Total 6 Click Score: 10    End of Session Equipment Utilized During Treatment: Oxygen Activity Tolerance: Patient tolerated  treatment well;Patient limited by fatigue Patient left: in bed;with call bell/phone within reach;with bed alarm set;with family/visitor present Nurse Communication: Mobility status PT Visit Diagnosis: Other abnormalities of gait and mobility (R26.89);Pain Pain - Right/Left: Right Pain - part of body: Leg    Time: 6283-6629 PT Time Calculation (min) (ACUTE ONLY): 41 min   Charges:   PT Evaluation $PT Eval Moderate Complexity: 1 Mod PT Treatments $Therapeutic Activity: 23-37 mins        11/28/2020  Ginger Carne., PT Acute Rehabilitation Services [phone]  (pager) [phone]  (office)  Joel Perez 11/28/2020, 10:55 AM

## 2020-11-28 NOTE — Progress Notes (Signed)
Inpatient Rehab Admissions Coordinator:   I met with Pt. At bedside to discuss potential CIR admit. Pt. Is interested and wife can provide 24/7 support. I will submit to insurance today.   Clemens Catholic, [place], [place] Admissions Coordinator  [phone] ([place]) [phone] (office)

## 2020-11-28 NOTE — Progress Notes (Signed)
PROGRESS NOTE   Joel Perez  IRW:[id number] DOB: 1945-06-20 DOA: 11/23/2020 PCP: Joel Contras, MD  Brief Narrative:  75 year old white male community dwelling HFpEF LBBB  COPD +4 L at baseline follows with Dr. Melvyn Perez Right fifth toe amputation by vascular surgery 08/28/2020 growing MSSA--went home on PICC line with Ancef until 5/22 and wound BMI 33 HFpEF 60-65%  Readmit from home after home health nurse noted wound is looking purulent-patient was feeling weak over the past 3 days also had been on prednisone On exam found to have foul-smelling infected lateral right foot x-ray consistent with osteomyelitis CT chest = RLL pneumonia 1.5 spiculated RUL mass WBC 25  Hospital-Problem based course  MSSA s/p R sided BKA 7/5 PM Blood culture X2 from 7/3 confirmed staph-surveillance cultures 7/5 neg so far Echocardiogram 7/6 no valvular vegetations ID rec's 6 wk Abx ending 01/08/21 Leukocytosis worsening is probably secondary to 1st post-op d CRP is trending down nicely R LL infiltrate?  Postobstructive CXR 7/7 possible PNA-[unlikely--no sputum, no cough worse than prior]-will cover x 5 days and narrow per ID  subsequently RUL 1.5 spiculated mass Outpatient follow-up Dr. Melvyn Perez Recent COVID-19 infection Treated--curently relatively asymptomatic HFpEF EF 60-65% Losartan 50 on hold Lasix 20 on hold Revatio 20 on hold Resume as OP ?  DM TY 2 Obesity BMI 32 Not on any prior to admission meds-sugars trending 70-100 [ was NPO] Discontinue sliding scale Attempt in the outpatient setting to de-escalate steroids and will CC Dr. Melvyn Perez to coordinate this   DVT prophylaxis: lovenox Code Status: full Family Communication: wife Joel Perez [ph [phone]]at bedsdie--updated fully 7/7 Disposition:  Status is: Inpatient  Remains inpatient appropriate because:Hemodynamically unstable and Unsafe d/c plan  Dispo: The patient is from: Home              Anticipated d/c is to: SNF              Patient  currently is not medically stable to d/c.   Difficult to place patient No       Consultants:  ortho  Procedures:   Right-sided BKA 11/27/2020   Antimicrobials:    Unasyn since 11/27/2020  Subjective:  Awake coherent no distress talking with rehab  Objective: Vitals:   11/28/20 0232 11/28/20 0233 11/28/20 0544 11/28/20 0545  BP: 119/71 119/71 (!) 171/75 140/87  Pulse: 88 88 91 90  Resp: (!) 22 (!) 22  (!) 22  Temp:  97.6 F (36.4 C)  97.9 F (36.6 C)  TempSrc:  Oral    SpO2: 98% 98% 98% 98%  Weight:        Intake/Output Summary (Last 24 hours) at 11/28/2020 0803 Last data filed at 11/28/2020 0700 Gross per 24 hour  Intake 1448.95 ml  Output 1500 ml  Net -51.05 ml    Filed Weights   11/23/20 1524  Weight: 103.3 kg    Examination:  EOMI NCAT no focal deficit CTA B Wound not assessed today Abdomen soft  Data Reviewed: personally reviewed   CBC    Component Value Date/Time   WBC 23.3 (H) 11/28/2020 0418   RBC 3.83 (L) 11/28/2020 0418   HGB 10.3 (L) 11/28/2020 0418   HCT 32.7 (L) 11/28/2020 0418   PLT 510 (H) 11/28/2020 0418   MCV 85.4 11/28/2020 0418   MCH 26.9 11/28/2020 0418   MCHC 31.5 11/28/2020 0418   RDW 16.8 (H) 11/28/2020 0418   LYMPHSABS 1.7 11/28/2020 0418   MONOABS 0.7 11/28/2020 0418   EOSABS 0.0 11/28/2020  0418   BASOSABS 0.1 11/28/2020 0418   CMP Latest Ref Rng & Units 11/28/2020 11/27/2020 11/26/2020  Glucose 70 - 99 mg/dL 115(H) 82 88  BUN 8 - 23 mg/dL 12 13 15   Creatinine 0.61 - 1.24 mg/dL 0.78 0.75 0.75  Sodium 135 - 145 mmol/L 136 136 136  Potassium 3.5 - 5.1 mmol/L 4.5 4.4 4.1  Chloride 98 - 111 mmol/L 101 103 104  CO2 22 - 32 mmol/L 30 26 25   Calcium 8.9 - 10.3 mg/dL 8.1(L) 8.1(L) 7.9(L)  Total Protein 6.5 - 8.1 g/dL 5.1(L) 5.1(L) 4.8(L)  Total Bilirubin 0.3 - 1.2 mg/dL 0.5 0.5 0.6  Alkaline Phos 38 - 126 U/L 53 57 53  AST 15 - 41 U/L 34 29 32  ALT 0 - 44 U/L 11 12 19      Radiology Studies: DG Chest 2 View  Result Date:  11/27/2020 CLINICAL DATA:  Pneumonia follow-up EXAM: CHEST - 2 VIEW COMPARISON:  11/23/2020 FINDINGS: Persistent opacity at the right lung base. Nodular opacities are identified in the right lung. Increased density at the left lung base. Small bilateral pleural effusions. Similar cardiomediastinal contours. No pneumothorax. IMPRESSION: Persistent opacities at the right lung base and right lung nodules. Increased left basilar atelectasis/consolidation. Small bilateral pleural effusions. Electronically Signed   By: Macy Mis M.D.   On: 11/27/2020 09:08   ECHOCARDIOGRAM COMPLETE  Result Date: 11/26/2020    ECHOCARDIOGRAM REPORT   Patient Name:   Joel Perez Date of Exam: 11/26/2020 Medical Rec #:  [id number]     Height:       70.0 in Accession #:    [id number]    Weight:       227.7 lb Date of Birth:  July 03, 1945     BSA:          2.206 m Patient Age:    24 years      BP:           135/63 mmHg Patient Gender: M             HR:           92 bpm. Exam Location:  Inpatient Procedure: 2D Echo, Cardiac Doppler and Color Doppler Indications:    bacteremia  History:        Patient has prior history of Echocardiogram examinations, most                 recent 07/22/2020. Risk Factors:Former Smoker.  Sonographer:    Cammy Brochure Referring Phys: [id number] [place]  1. Left ventricular ejection fraction, by estimation, is 60 to 65%. The left ventricle has normal function. The left ventricle has no regional wall motion abnormalities. There is mild left ventricular hypertrophy. Left ventricular diastolic parameters were normal.  2. Right ventricular systolic function is normal. The right ventricular size is normal.  3. The mitral valve is normal in structure. Trivial mitral valve regurgitation. No evidence of mitral stenosis.  4. The aortic valve is calcified. There is moderate calcification of the aortic valve. There is moderate thickening of the aortic valve. Aortic valve regurgitation is trivial. Mild  aortic valve stenosis.  5. The inferior vena cava is normal in size with greater than 50% respiratory variability, suggesting right atrial pressure of 3 mmHg. Comparison(s): No significant change from prior study. Conclusion(s)/Recommendation(s): No evidence of valvular vegetations on this transthoracic echocardiogram. Would recommend a transesophageal echocardiogram to exclude infective endocarditis if clinically indicated. FINDINGS  Left Ventricle: Left ventricular  ejection fraction, by estimation, is 60 to 65%. The left ventricle has normal function. The left ventricle has no regional wall motion abnormalities. The left ventricular internal cavity size was normal in size. There is  mild left ventricular hypertrophy. Left ventricular diastolic parameters were normal. Right Ventricle: The right ventricular size is normal. No increase in right ventricular wall thickness. Right ventricular systolic function is normal. Left Atrium: Left atrial size was normal in size. Right Atrium: Right atrial size was normal in size. Pericardium: There is no evidence of pericardial effusion. Mitral Valve: The mitral valve is normal in structure. Trivial mitral valve regurgitation. No evidence of mitral valve stenosis. Tricuspid Valve: The tricuspid valve is normal in structure. Tricuspid valve regurgitation is trivial. No evidence of tricuspid stenosis. Aortic Valve: The aortic valve is calcified. There is moderate calcification of the aortic valve. There is moderate thickening of the aortic valve. Aortic valve regurgitation is trivial. Mild aortic stenosis is present. Aortic valve mean gradient measures 5.7 mmHg. Aortic valve peak gradient measures 10.2 mmHg. Aortic valve area, by VTI measures 2.80 cm. Pulmonic Valve: The pulmonic valve was not well visualized. Pulmonic valve regurgitation is not visualized. Aorta: The aortic root and ascending aorta are structurally normal, with no evidence of dilitation. Venous: The inferior  vena cava is normal in size with greater than 50% respiratory variability, suggesting right atrial pressure of 3 mmHg. IAS/Shunts: The atrial septum is grossly normal.  LEFT VENTRICLE PLAX 2D LVIDd:         4.50 cm LVIDs:         3.00 cm LV PW:         1.40 cm LV IVS:        0.90 cm LVOT diam:     2.10 cm LV SV:         75 LV SV Index:   34 LVOT Area:     3.46 cm  RIGHT VENTRICLE          IVC RV Basal diam:  3.50 cm  IVC diam: 0.70 cm LEFT ATRIUM             Index       RIGHT ATRIUM           Index LA diam:        3.00 cm 1.36 cm/m  RA Area:     11.50 cm LA Vol (A2C):   54.9 ml 24.89 ml/m RA Volume:   23.70 ml  10.74 ml/m LA Vol (A4C):   50.9 ml 23.08 ml/m LA Biplane Vol: 54.3 ml 24.62 ml/m  AORTIC VALVE AV Area (Vmax):    2.86 cm AV Area (Vmean):   2.82 cm AV Area (VTI):     2.80 cm AV Vmax:           159.33 cm/s AV Vmean:          110.000 cm/s AV VTI:            0.269 m AV Peak Grad:      10.2 mmHg AV Mean Grad:      5.7 mmHg LVOT Vmax:         131.67 cm/s LVOT Vmean:        89.567 cm/s LVOT VTI:          0.217 m LVOT/AV VTI ratio: 0.81  AORTA Ao Root diam: 3.30 cm Ao Asc diam:  2.90 cm  SHUNTS Systemic VTI:  0.22 m Systemic Diam: 2.10 cm Buford Dresser MD Electronically  signed by Buford Dresser MD Signature Date/Time: 11/26/2020/2:45:08 PM    Final      Scheduled Meds:  (feeding supplement) PROSource Plus  30 mL Oral BID BM   vitamin C  500 mg Oral Daily   cholecalciferol  1,000 Units Oral Daily   cyclobenzaprine  10 mg Oral QHS   docusate sodium  100 mg Oral Daily   enoxaparin (LOVENOX) injection  40 mg Subcutaneous Q24H   feeding supplement  237 mL Oral TID BM   folic acid  1 mg Oral Daily   melatonin  10 mg Oral QHS   multivitamin with minerals  1 tablet Oral Daily   nutrition supplement (JUVEN)  1 packet Oral BID BM   pantoprazole  40 mg Oral Daily   predniSONE  20 mg Oral Q breakfast   saccharomyces boulardii  250 mg Oral BID   sodium chloride flush  3 mL Intravenous  Q12H   thiamine  100 mg Oral Daily   zinc sulfate  220 mg Oral Daily   Continuous Infusions:  sodium chloride Stopped (11/25/20 0658)   sodium chloride 75 mL/hr at 11/28/20 0649   ampicillin-sulbactam (UNASYN) IV 3 g (11/28/20 0338)   lactated ringers 50 mL/hr at 11/27/20 1410     LOS: 5 days   Time spent: [place], MD [facility] To contact the attending provider between 7A-7P or the covering provider during after hours 7P-7A, please log into the web site [email or web address] and access using universal [place] password for that web site. If you do not have the password, please call the hospital operator.  11/28/2020, 8:03 AM

## 2020-11-28 NOTE — Progress Notes (Signed)
Rehab Admissions Coordinator Note:  Patient was screened by Cleatrice Burke for appropriateness for an Inpatient Acute Rehab Consult per therapy recs. It is unlikely his insurance will approve CIR for this diagnosis. Recommend other rehab venues to be pursued.. I will not place a rehab consult.  Cleatrice Burke RN MSN 11/28/2020, 12:03 PM  I can be reached at [phone].

## 2020-11-28 NOTE — Progress Notes (Signed)
PHARMACY CONSULT NOTE FOR:  OUTPATIENT  PARENTERAL ANTIBIOTIC THERAPY (OPAT)  Indication: osteomyelitis and recurrent MSSA bacteremia Regimen: Ancef 2g IV q8hr starting 7/12 after Unasyn to end 7/11 End date:  01/09/2019  IV antibiotic discharge orders are pended. To discharging provider:  please sign these orders via discharge navigator,  Select New Orders & click on the button choice - Manage This Unsigned Work.     Thank you for allowing pharmacy to be a part of this patient's care.   Joel Perez [name], PharmD, BCPS Clinical Staff Pharmacist [email or web address]  [name] 11/28/2020, 12:52 PM

## 2020-11-28 NOTE — TOC Progression Note (Signed)
Pn[facility]) - Pro[facility] Note    Patient Details  Name: Joel Perez MRN: [id number] Date of Birth: 09/25/1945  Transition of Care [facility]) CM/SW [place], RN Phone Number: 11/28/2020, 12:11 PM  Clinical Narrative:     PT recommendation of CIR, however doubtful insurance will cover, Now SNF v Home Health options. Patient will need antibiotics IV through 01/08/2021 according to ID, OPAT has been sent. Will notify PT of change for new recommendations.     Barriers to Discharge: Continued Medical Work up  Expected Discharge Plan and Services     Discharge Planning Services: CM Consult   Living arrangements for the past 2 months: Single Family Home                                       Social Determinants of Health (SDOH) Interventions    Readmission Risk Interventions No flowsheet data found.

## 2020-11-28 NOTE — Progress Notes (Signed)
  Progress Note    11/28/2020 8:02 AM 1 Day Post-Op  Subjective:  no complaints. Says pain tolerable   Vitals:   11/28/20 0544 11/28/20 0545  BP: (!) 171/75 140/87  Pulse: 91 90  Resp:  (!) 22  Temp:  97.9 F (36.6 C)  SpO2: 98% 98%   Physical Exam: Cardiac:  regular Lungs:  non labored Incisions:  right BKA with limb protector and shrinker in place. Minimal output from [facility] Extremities:  well perfused and warm Neurologic: alert and oriented  CBC    Component Value Date/Time   WBC 23.3 (H) 11/28/2020 0418   RBC 3.83 (L) 11/28/2020 0418   HGB 10.3 (L) 11/28/2020 0418   HCT 32.7 (L) 11/28/2020 0418   PLT 510 (H) 11/28/2020 0418   MCV 85.4 11/28/2020 0418   MCH 26.9 11/28/2020 0418   MCHC 31.5 11/28/2020 0418   RDW 16.8 (H) 11/28/2020 0418   LYMPHSABS 1.7 11/28/2020 0418   MONOABS 0.7 11/28/2020 0418   EOSABS 0.0 11/28/2020 0418   BASOSABS 0.1 11/28/2020 0418    BMET    Component Value Date/Time   NA 136 11/28/2020 0418   NA 140 01/04/2019 1451   K 4.5 11/28/2020 0418   CL 101 11/28/2020 0418   CO2 30 11/28/2020 0418   GLUCOSE 115 (H) 11/28/2020 0418   BUN 12 11/28/2020 0418   BUN 12 01/04/2019 1451   CREATININE 0.78 11/28/2020 0418   CALCIUM 8.1 (L) 11/28/2020 0418   GFRNONAA >60 11/28/2020 0418   GFRAA 91 01/04/2019 1451    INR    Component Value Date/Time   INR 1.0 11/23/2020 1358     Intake/Output Summary (Last 24 hours) at 11/28/2020 0802 Last data filed at 11/28/2020 0700 Gross per 24 hour  Intake 1448.95 ml  Output 1500 ml  Net -51.05 ml     Assessment/Plan:  75 y.o. male is s/p right BKA 1 Day Post-Op   Doing well post op. Hemodynamically stable BKA wound VAC system in place as well as stump protector Maintain dressing. [facility] consulted for stump care Pain overall well controlled PT/ OT evaluate/ mobilize as tolerated Will need staples removed in 4 weeks  DVT prophylaxis:  lovenox   Karoline Caldwell, PA-C [facility] [phone] 11/28/2020 8:02 AM

## 2020-11-29 DIAGNOSIS — R7881 Bacteremia: Secondary | ICD-10-CM | POA: Diagnosis not present

## 2020-11-29 DIAGNOSIS — B9561 Methicillin susceptible Staphylococcus aureus infection as the cause of diseases classified elsewhere: Secondary | ICD-10-CM | POA: Diagnosis not present

## 2020-11-29 LAB — BASIC METABOLIC PANEL
Anion gap: 3 — ABNORMAL LOW (ref 5–15)
BUN: 13 mg/dL (ref 8–23)
CO2: 30 mmol/L (ref 22–32)
Calcium: 8.1 mg/dL — ABNORMAL LOW (ref 8.9–10.3)
Chloride: 104 mmol/L (ref 98–111)
Creatinine, Ser: 0.71 mg/dL (ref 0.61–1.24)
GFR, Estimated: >60 mL/min (ref >60)
Glucose, Bld: 96 mg/dL (ref 70–99)
Potassium: 4.2 mmol/L (ref 3.5–5.1)
Sodium: 137 mmol/L (ref 135–145)

## 2020-11-29 LAB — CBC
HCT: 30.4 % — ABNORMAL LOW (ref 39.0–52.0)
Hemoglobin: 10 g/dL — ABNORMAL LOW (ref 13.0–17.0)
MCH: 27.5 pg (ref 26.0–34.0)
MCHC: 32.9 g/dL (ref 30.0–36.0)
MCV: 83.7 fL (ref 80.0–100.0)
Platelets: 481 10*3/uL — ABNORMAL HIGH (ref 150–400)
RBC: 3.63 MIL/uL — ABNORMAL LOW (ref 4.22–5.81)
RDW: 17.2 % — ABNORMAL HIGH (ref 11.5–15.5)
WBC: 19.6 10*3/uL — ABNORMAL HIGH (ref 4.0–10.5)
nRBC: 0 % (ref 0.0–0.2)

## 2020-11-29 LAB — GLUCOSE, CAPILLARY
Glucose-Capillary: 156 mg/dL — ABNORMAL HIGH (ref 70–99)
Glucose-Capillary: 163 mg/dL — ABNORMAL HIGH (ref 70–99)
Glucose-Capillary: 83 mg/dL (ref 70–99)
Glucose-Capillary: 92 mg/dL (ref 70–99)

## 2020-11-29 MED ORDER — GABAPENTIN 600 MG PO TABS
300.0000 mg | ORAL_TABLET | Freq: Two times a day (BID) | ORAL | Status: DC
  Administered 2020-11-29 – 2020-12-07 (×18): 300 mg via ORAL
  Filled 2020-11-29 (×18): qty 1

## 2020-11-29 MED ORDER — OXYCODONE HCL 5 MG PO TABS
5.0000 mg | ORAL_TABLET | ORAL | Status: DC | PRN
  Administered 2020-11-29 – 2020-12-06 (×23): 10 mg via ORAL
  Administered 2020-12-06: 5 mg via ORAL
  Administered 2020-12-06 – 2020-12-09 (×14): 10 mg via ORAL
  Filled 2020-11-29 (×39): qty 2

## 2020-11-29 MED ORDER — UMECLIDINIUM BROMIDE 62.5 MCG/INH IN AEPB
1.0000 | INHALATION_SPRAY | Freq: Every day | RESPIRATORY_TRACT | Status: DC
  Administered 2020-11-29 – 2020-12-09 (×8): 1 via RESPIRATORY_TRACT
  Filled 2020-11-29 (×2): qty 7

## 2020-11-29 MED ORDER — MOMETASONE FURO-FORMOTEROL FUM 100-5 MCG/ACT IN AERO
2.0000 | INHALATION_SPRAY | Freq: Two times a day (BID) | RESPIRATORY_TRACT | Status: DC
  Administered 2020-11-29 – 2020-12-09 (×17): 2 via RESPIRATORY_TRACT
  Filled 2020-11-29: qty 8.8

## 2020-11-29 MED ORDER — ALBUTEROL SULFATE HFA 108 (90 BASE) MCG/ACT IN AERS: 2.0000 | INHALATION_SPRAY | Freq: Four times a day (QID) | RESPIRATORY_TRACT | Status: DC | PRN

## 2020-11-29 MED ORDER — ALBUTEROL SULFATE (2.5 MG/3ML) 0.083% IN NEBU: 2.5000 mg | INHALATION_SOLUTION | Freq: Four times a day (QID) | RESPIRATORY_TRACT | Status: DC | PRN

## 2020-11-29 MED ORDER — FUROSEMIDE 20 MG PO TABS
20.0000 mg | ORAL_TABLET | Freq: Every day | ORAL | Status: DC
  Administered 2020-11-29 – 2020-12-09 (×11): 20 mg via ORAL
  Filled 2020-11-29 (×11): qty 1

## 2020-11-29 MED ORDER — BUDESON-GLYCOPYRROL-FORMOTEROL 160-9-4.8 MCG/ACT IN AERO: 2.0000 | INHALATION_SPRAY | Freq: Two times a day (BID) | RESPIRATORY_TRACT | Status: DC

## 2020-11-29 MED ORDER — ACETAMINOPHEN 325 MG PO TABS
650.0000 mg | ORAL_TABLET | Freq: Four times a day (QID) | ORAL | Status: DC
  Administered 2020-11-29 – 2020-12-09 (×40): 650 mg via ORAL
  Filled 2020-11-29 (×40): qty 2

## 2020-11-29 MED ORDER — HYDROMORPHONE HCL 1 MG/ML IJ SOLN
0.5000 mg | INTRAMUSCULAR | Status: DC | PRN
  Administered 2020-11-29 – 2020-12-07 (×21): 0.5 mg via INTRAVENOUS
  Filled 2020-11-29 (×22): qty 1

## 2020-11-29 NOTE — Progress Notes (Signed)
Physical Therapy Treatment Patient Details Name: Joel Perez MRN: [id number] DOB: [birth year] Today's Date: 11/29/2020    History of Present Illness pt is a 75 y/o male admitted 7/7 with worsening weakness and R foot shown to have frank purulence.  R foot found to have osteomyelitis.  Pt has no furthe options for revascularization.  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  PMHx:  neuropathy, HTN, COPD, blindness R eye    PT Comments    Pt declining OOB due to pain. Agreeable to LE exercises. Pt instructed to perform exercises 2-3x day. Wife present in room and states she will encourage pt to complete exercises again today.   Follow Up Recommendations  CIR     Equipment Recommendations  3in1 (PT);Other (comment);Wheelchair (measurements PT);Wheelchair cushion (measurements PT)    Recommendations for Other Services       Precautions / Restrictions Precautions Precautions: Fall Restrictions RLE Weight Bearing: Non weight bearing Other Position/Activity Restrictions: limb protector for mobility    Mobility  Bed Mobility                    Transfers                    Ambulation/Gait                 Stairs             Wheelchair Mobility    Modified Rankin (Stroke Patients Only)       Balance                                            Cognition Arousal/Alertness: Awake/alert Behavior During Therapy: Flat affect;Agitated Overall Cognitive Status: Within Functional Limits for tasks assessed                                 General Comments: wife present in room      Exercises Amputee Exercises Quad Sets: AROM;Right;10 reps;Supine Hip ABduction/ADduction: AROM;Right;5 reps;Supine Straight Leg Raises: AAROM;Right;5 reps;Supine    General Comments        Pertinent Vitals/Pain Pain Assessment: 0-10 Pain Score: 8  Pain Location: RLE Pain Descriptors / Indicators: Grimacing;Discomfort;Guarding;Constant Pain  Intervention(s): Limited activity within patient's tolerance    Home Living                      Prior Function            PT Goals (current goals can now be found in the care plan section) Acute Rehab PT Goals Patient Stated Goal: decrease pain Progress towards PT goals: Not progressing toward goals - comment (pain)    Frequency    Min 4X/week      PT Plan Current plan remains appropriate    Co-evaluation              AM-PAC PT "6 Clicks" Mobility   Outcome Measure  Help needed turning from your back to your side while in a flat bed without using bedrails?: A Little Help needed moving from lying on your back to sitting on the side of a flat bed without using bedrails?: A Little Help needed moving to and from a bed to a chair (including a wheelchair)?: Total Help needed standing up from a chair using your  arms (e.g., wheelchair or bedside chair)?: Total Help needed to walk in hospital room?: Total Help needed climbing 3-5 steps with a railing? : Total 6 Click Score: 10    End of Session Equipment Utilized During Treatment: Oxygen (4L 94% SpO2) Activity Tolerance: Patient limited by pain Patient left: in bed;with call bell/phone within reach;with bed alarm set;with family/visitor present Nurse Communication: Mobility status PT Visit Diagnosis: Other abnormalities of gait and mobility (R26.89);Pain Pain - Right/Left: Right Pain - part of body: Leg     Time: 9758-8325 PT Time Calculation (min) (ACUTE ONLY): 15 min  Charges:  $Therapeutic Exercise: 8-22 mins                     Lorrin Goodell, PT  Office # [phone] Pager [phone]    Lorriane Shire 11/29/2020, 12:02 PM

## 2020-11-29 NOTE — Progress Notes (Signed)
PROGRESS NOTE   JOSIE MESA  XKG:[id number] DOB: Aug 05, 1945 DOA: 11/23/2020 PCP: [name], MD  Brief Narrative:  75 year old white male community dwelling HFpEF LBBB  COPD +4 L at baseline follows with Dr. [name] Right fifth toe amputation by vascular surgery 08/28/2020 growing MSSA--went home on PICC line with Ancef until 5/22 and wound BMI 33 HFpEF 60-65%  Readmit from home after home health nurse noted wound is looking purulent-patient was feeling weak over the past 3 days also had been on prednisone On exam found to have foul-smelling infected lateral right foot x-ray consistent with osteomyelitis CT chest = RLL pneumonia 1.5 spiculated RUL mass WBC 25  Hospital-Problem based course  MSSA s/p R sided BKA 7/5 PM Blood culture X2 from 7/3 confirmed staph-surveillance cultures 7/5 neg so far Echocardiogram 7/6 no valvular vegetations ID rec's 6 wk Abx ANCEF ending 01/08/21--PICC to be placed Pain uncontrolled 8/10 on Flexeril 10, Oxy IR 5-10 every 4 as needed and breakthrough Dilaudid If remains uncontrolled escalate to Percocet and consider oral Dilaudid in the next 24 hours I am adding gabapentin 300 twice daily for nerve pain but we will watch him closely  R LL infiltrate?  Postobstructive CXR 7/7 possible PNA-[unlikely--no sputum, no cough worse than prior]-will cover x 5 days ending 12/01/20 [switch to ancef after] RUL 1.5 spiculated mass Outpatient follow-up Dr. [name] Recent COVID-19 infection Treated--curently relatively asymptomatic HFpEF EF 60-65% Losartan 50 on hold Lasix 20 on hold Revatio 20 on hold Resume as OP ?  DM TY 2 Obesity BMI 32 Not on any prior to admission meds-sugars trending 83-162 [ was NPO] Discontinue sliding scale Attempt in the outpatient setting to de-escalate steroids and will CC Dr. [name] to coordinate this   DVT prophylaxis: lovenox Code Status: full Family Communication: wife gwendolyn [ph [phone]]at the bedside and I discussed with  her in great detail the plan- Disposition:  Status is: Inpatient  Remains inpatient appropriate because:Hemodynamically unstable and Unsafe d/c plan  Dispo: The patient is from: Home              Anticipated d/c is to: SNF likely when available on 7/11              Patient currently is not medically stable to d/c.   Difficult to place patient No       Consultants:  ortho  Procedures:   Right-sided BKA 11/27/2020   Antimicrobials:    Unasyn since 11/27/2020 till 7/11   Subjective:  Pain is poorly controlled meds were adjusted by vascular  He has no fever chills regular 1 episode of loose stool yesterday No nausea no vomiting no cough no cold   Objective: Vitals:   11/28/20 1708 11/28/20 2128 11/29/20 0542 11/29/20 0941  BP: (!) 124/57 133/69 138/80 106/68  Pulse: 96 96 99 98  Resp: 18 20 20 18   Temp: 98.2 F (36.8 C) 97.8 F (36.6 C) 97.7 F (36.5 C) 98.5 F (36.9 C)  TempSrc:  Oral Oral Oral  SpO2: 96% 99% 100% 93%  Weight:   106.3 kg     Intake/Output Summary (Last 24 hours) at 11/29/2020 1111 Last data filed at 11/29/2020 0852 Gross per 24 hour  Intake 4868.2 ml  Output 1800 ml  Net 3068.2 ml    Filed Weights   11/23/20 1524 11/29/20 0542  Weight: 103.3 kg 106.3 kg    Examination:  Awake coherent in some painful distress no distress otherwise chest is clear on oxygen Abdomen soft no  rebound no guarding Lower extremity stump not examined S1-S2 no murmur Neurologically intact moving all 3 limbs excluding amputated area  Data Reviewed: personally reviewed   CBC    Component Value Date/Time   WBC 19.6 (H) 11/29/2020 0206   RBC 3.63 (L) 11/29/2020 0206   HGB 10.0 (L) 11/29/2020 0206   HCT 30.4 (L) 11/29/2020 0206   PLT 481 (H) 11/29/2020 0206   MCV 83.7 11/29/2020 0206   MCH 27.5 11/29/2020 0206   MCHC 32.9 11/29/2020 0206   RDW 17.2 (H) 11/29/2020 0206   LYMPHSABS 1.7 11/28/2020 0418   MONOABS 0.7 11/28/2020 0418   EOSABS 0.0 11/28/2020  0418   BASOSABS 0.1 11/28/2020 0418   CMP Latest Ref Rng & Units 11/29/2020 11/28/2020 11/27/2020  Glucose 70 - 99 mg/dL 96 115(H) 82  BUN 8 - 23 mg/dL 13 12 13   Creatinine 0.61 - 1.24 mg/dL 0.71 0.78 0.75  Sodium 135 - 145 mmol/L 137 136 136  Potassium 3.5 - 5.1 mmol/L 4.2 4.5 4.4  Chloride 98 - 111 mmol/L 104 101 103  CO2 22 - 32 mmol/L 30 30 26   Calcium 8.9 - 10.3 mg/dL 8.1(L) 8.1(L) 8.1(L)  Total Protein 6.5 - 8.1 g/dL - 5.1(L) 5.1(L)  Total Bilirubin 0.3 - 1.2 mg/dL - 0.5 0.5  Alkaline Phos 38 - 126 U/L - 53 57  AST 15 - 41 U/L - 34 29  ALT 0 - 44 U/L - 11 12     Radiology Studies: [place] EKG Site Rite  Result Date: 11/28/2020 If Site Rite image not attached, placement could not be confirmed due to current cardiac rhythm.    Scheduled Meds:  (feeding supplement) PROSource Plus  30 mL Oral BID BM   acetaminophen  650 mg Oral Q6H   vitamin C  500 mg Oral Daily   cholecalciferol  1,000 Units Oral Daily   cyclobenzaprine  10 mg Oral QHS   docusate sodium  100 mg Oral Daily   enoxaparin (LOVENOX) injection  40 mg Subcutaneous Q24H   feeding supplement  237 mL Oral TID BM   folic acid  1 mg Oral Daily   melatonin  10 mg Oral QHS   multivitamin with minerals  1 tablet Oral Daily   nutrition supplement (JUVEN)  1 packet Oral BID BM   pantoprazole  40 mg Oral Daily   predniSONE  20 mg Oral Q breakfast   saccharomyces boulardii  250 mg Oral BID   sodium chloride flush  3 mL Intravenous Q12H   thiamine  100 mg Oral Daily   zinc sulfate  220 mg Oral Daily   Continuous Infusions:  sodium chloride Stopped (11/25/20 0658)   sodium chloride 75 mL/hr at 11/29/20 1022   ampicillin-sulbactam (UNASYN) IV 3 g (11/29/20 0851)   [START ON 12/02/2020]  ceFAZolin (ANCEF) IV     lactated ringers 50 mL/hr at 11/28/20 1602     LOS: 6 days   Time spent: [place], MD [facility] To contact the attending provider between 7A-7P or the covering provider during after hours  7P-7A, please log into the web site [email or web address] and access using universal [place] password for that web site. If you do not have the password, please call the hospital operator.  11/29/2020, 11:11 AM

## 2020-11-29 NOTE — Progress Notes (Addendum)
At bedside to place PICC line.  Pt refusing for placement today, states he "feels too bad".  Complaining of pain in leg, requesting pain med.  Wife at bedside states he has had a bad morning.  Plan agreed upon that PICC RN would return Sunday around 0730-0800 if no emergencies elsewhere.Candice RN notified via securechat. Current PIV working well.

## 2020-11-30 DIAGNOSIS — J9611 Chronic respiratory failure with hypoxia: Secondary | ICD-10-CM | POA: Diagnosis not present

## 2020-11-30 DIAGNOSIS — M86172 Other acute osteomyelitis, left ankle and foot: Secondary | ICD-10-CM | POA: Diagnosis not present

## 2020-11-30 DIAGNOSIS — R7881 Bacteremia: Secondary | ICD-10-CM | POA: Diagnosis not present

## 2020-11-30 DIAGNOSIS — L089 Local infection of the skin and subcutaneous tissue, unspecified: Secondary | ICD-10-CM | POA: Diagnosis not present

## 2020-11-30 LAB — COMPREHENSIVE METABOLIC PANEL
ALT: 16 U/L (ref 0–44)
AST: 38 U/L (ref 15–41)
Albumin: 1.9 g/dL — ABNORMAL LOW (ref 3.5–5.0)
Alkaline Phosphatase: 49 U/L (ref 38–126)
Anion gap: 6 (ref 5–15)
BUN: 15 mg/dL (ref 8–23)
CO2: 29 mmol/L (ref 22–32)
Calcium: 7.9 mg/dL — ABNORMAL LOW (ref 8.9–10.3)
Chloride: 103 mmol/L (ref 98–111)
Creatinine, Ser: 0.77 mg/dL (ref 0.61–1.24)
GFR, Estimated: >60 mL/min (ref >60)
Glucose, Bld: 92 mg/dL (ref 70–99)
Potassium: 3.9 mmol/L (ref 3.5–5.1)
Sodium: 138 mmol/L (ref 135–145)
Total Bilirubin: 0.5 mg/dL (ref 0.3–1.2)
Total Protein: 5 g/dL — ABNORMAL LOW (ref 6.5–8.1)

## 2020-11-30 LAB — GLUCOSE, CAPILLARY
Glucose-Capillary: 82 mg/dL (ref 70–99)
Glucose-Capillary: 180 mg/dL — ABNORMAL HIGH (ref 70–99)
Glucose-Capillary: 171 mg/dL — ABNORMAL HIGH (ref 70–99)
Glucose-Capillary: 145 mg/dL — ABNORMAL HIGH (ref 70–99)

## 2020-11-30 LAB — CULTURE, BLOOD (ROUTINE X 2)
Culture: NO GROWTH
Culture: NO GROWTH
Special Requests: ADEQUATE
Special Requests: ADEQUATE

## 2020-11-30 MED ORDER — SODIUM CHLORIDE 0.9% FLUSH: 10.0000 mL | INTRAVENOUS | Status: DC | PRN

## 2020-11-30 MED ORDER — CHLORHEXIDINE GLUCONATE CLOTH 2 % EX PADS
6.0000 | MEDICATED_PAD | Freq: Every day | CUTANEOUS | Status: DC
  Administered 2020-12-01 – 2020-12-09 (×9): 6 via TOPICAL

## 2020-11-30 MED ORDER — LOSARTAN POTASSIUM 25 MG PO TABS
25.0000 mg | ORAL_TABLET | Freq: Every day | ORAL | Status: DC
  Administered 2020-11-30 – 2020-12-09 (×10): 25 mg via ORAL
  Filled 2020-11-30 (×10): qty 1

## 2020-11-30 NOTE — Progress Notes (Signed)
Peripherally Inserted Central Catheter Placement  The IV Nurse has discussed with the patient and/or persons authorized to consent for the patient, the purpose of this procedure and the potential benefits and risks involved with this procedure.  The benefits include less needle sticks, lab draws from the catheter, and the patient may be discharged home with the catheter. Risks include, but not limited to, infection, bleeding, blood clot (thrombus formation), and puncture of an artery; nerve damage and irregular heartbeat and possibility to perform a PICC exchange if needed/ordered by physician.  Alternatives to this procedure were also discussed.  Bard Power PICC patient education guide, fact sheet on infection prevention and patient information card has been provided to patient /or left at bedside.    PICC Placement Documentation  PICC Single Lumen 13/88/71 Left Cephalic 46 cm 0 cm (Active)  Indication for Insertion or Continuance of Line Home intravenous therapies (PICC only) 11/30/20 0832  Exposed Catheter (cm) 0 cm 11/30/20 9597  Site Assessment Clean;Dry;Intact 11/30/20 0832  Line Status Flushed;Saline locked;Blood return noted 11/30/20 0832  Dressing Type Transparent 11/30/20 0832  Dressing Status Clean;Dry;Intact 11/30/20 0832  Antimicrobial disc in place? Yes 11/30/20 0832  Safety Lock Not Applicable 47/18/55 0158  Line Care Tubing changed;Cap changed;Connections checked and tightened 11/30/20 0832  Line Adjustment (NICU/IV Team Only) No 11/30/20 0832  Dressing Intervention New dressing 11/30/20 0832  Dressing Change Due 12/07/20 11/30/20 0832       Rolena Infante 11/30/2020, 8:34 AM

## 2020-11-30 NOTE — Progress Notes (Signed)
PROGRESS NOTE   Joel Perez  XNT:[id number] DOB: 25-Nov-1945 DOA: 11/23/2020 PCP: [name], MD  Brief Narrative:  75 year old white male community dwelling HFpEF LBBB  COPD +4 L at baseline follows with Dr. [name] Right fifth toe amputation by vascular surgery 08/28/2020 growing MSSA--went home on PICC line with Ancef until 5/22 and wound BMI 33 HFpEF 60-65%  Readmit from home after home health nurse noted wound is looking purulent-patient was feeling weak over the past 3 days also had been on prednisone On exam found to have foul-smelling infected lateral right foot x-ray consistent with osteomyelitis CT chest = RLL pneumonia 1.5 spiculated RUL mass WBC 25  Hospital-Problem based course  MSSA s/p R sided BKA 7/5 PM Blood culture X2 from 7/3 confirmed staph-surveillance cultures 7/5 neg Echocardiogram 7/6 no valvular vegetations ID rec's 6 wk Abx ANCEF ending 01/08/21--PICC  placed 7/10 Oxy IR 5-10 every 4 as needed and breakthrough Dilaudid Gabapentin 300 twice daily for nerve pain, continue Flexeril 10 at bedtime in addition Patient stabilizing in next several days for rehab R LL infiltrate?  Postobstructive CXR 7/7 possible PNA-[unlikely--no sputum, no cough worse than prior]-will cover x 5 days ending 12/01/20 [switch to ancef after] RUL 1.5 spiculated mass Outpatient follow-up Dr. [name] Recent COVID-19 infection Treated--curently relatively asymptomatic HFpEF EF 60-65% Losartan resumed at 25 mg dose Lasix 20 on hold Revatio 20 on hold till Op re-evl ?  DM TY 2 Obesity BMI 32 Not on any prior to admission meds-sugars trending 83-162 [ was NPO] Discontinue sliding scale Attempt in the outpatient setting to de-escalate steroids and will CC Dr. [name] to coordinate this   DVT prophylaxis: lovenox Code Status: full Family Communication: wife gwendolyn [ph [phone]]at the bedside and I discussed with her in great detail the plan- on seevral days including 7/10 Disposition:   Status is: Inpatient  Remains inpatient appropriate because:Hemodynamically unstable and Unsafe d/c plan  Dispo: The patient is from: Home              Anticipated d/c is to: SNF likely when available on 7/11              Patient currently is not medically stable to d/c.   Difficult to place patient No  Consultants:  ortho  Procedures:   Right-sided BKA 11/27/2020   Antimicrobials:    Unasyn since 11/27/2020 till 7/11   Subjective:  Pain control is much better he is in no distress he has worked some with therapy intermittently No chest pain no fever no chills no nausea no vomiting Wife remains at bedside  Objective: Vitals:   11/29/20 1555 11/29/20 2024 11/30/20 0554 11/30/20 0927  BP: 125/66 (!) 141/66 (!) 148/97 116/66  Pulse: (!) 103 97 (!) 105 (!) 109  Resp: 18 20 18 18   Temp: 98 F (36.7 C) 97.6 F (36.4 C) (!) 97.5 F (36.4 C) 98 F (36.7 C)  TempSrc: Oral Oral Oral   SpO2: 95% 97% 95% 95%  Weight:        Intake/Output Summary (Last 24 hours) at 11/30/2020 1004 Last data filed at 11/30/2020 0930 Gross per 24 hour  Intake 2470.14 ml  Output 1875 ml  Net 595.14 ml    Filed Weights   11/23/20 1524 11/29/20 0542  Weight: 103.3 kg 106.3 kg    Examination:  Coherent pleasant no distress Abdomen soft no rebound no guarding Left lower extremity stump shrinker in place S1-S2 no murmur Neurologically intact moving all 3 limbs excluding amputated  area  Data Reviewed: personally reviewed   CBC    Component Value Date/Time   WBC 19.6 (H) 11/29/2020 0206   RBC 3.63 (L) 11/29/2020 0206   HGB 10.0 (L) 11/29/2020 0206   HCT 30.4 (L) 11/29/2020 0206   PLT 481 (H) 11/29/2020 0206   MCV 83.7 11/29/2020 0206   MCH 27.5 11/29/2020 0206   MCHC 32.9 11/29/2020 0206   RDW 17.2 (H) 11/29/2020 0206   LYMPHSABS 1.7 11/28/2020 0418   MONOABS 0.7 11/28/2020 0418   EOSABS 0.0 11/28/2020 0418   BASOSABS 0.1 11/28/2020 0418   CMP Latest Ref Rng & Units 11/30/2020  11/29/2020 11/28/2020  Glucose 70 - 99 mg/dL 92 96 115(H)  BUN 8 - 23 mg/dL 15 13 12   Creatinine 0.61 - 1.24 mg/dL 0.77 0.71 0.78  Sodium 135 - 145 mmol/L 138 137 136  Potassium 3.5 - 5.1 mmol/L 3.9 4.2 4.5  Chloride 98 - 111 mmol/L 103 104 101  CO2 22 - 32 mmol/L 29 30 30   Calcium 8.9 - 10.3 mg/dL 7.9(L) 8.1(L) 8.1(L)  Total Protein 6.5 - 8.1 g/dL 5.0(L) - 5.1(L)  Total Bilirubin 0.3 - 1.2 mg/dL 0.5 - 0.5  Alkaline Phos 38 - 126 U/L 49 - 53  AST 15 - 41 U/L 38 - 34  ALT 0 - 44 U/L 16 - 11     Radiology Studies: [place] EKG Site Rite  Result Date: 11/28/2020 If Site Rite image not attached, placement could not be confirmed due to current cardiac rhythm.    Scheduled Meds:  (feeding supplement) PROSource Plus  30 mL Oral BID BM   acetaminophen  650 mg Oral Q6H   vitamin C  500 mg Oral Daily   Chlorhexidine Gluconate Cloth  6 each Topical Daily   cholecalciferol  1,000 Units Oral Daily   cyclobenzaprine  10 mg Oral QHS   docusate sodium  100 mg Oral Daily   feeding supplement  237 mL Oral TID BM   folic acid  1 mg Oral Daily   furosemide  20 mg Oral Daily   gabapentin  300 mg Oral BID   losartan  25 mg Oral Daily   melatonin  10 mg Oral QHS   mometasone-formoterol  2 puff Inhalation BID   And   umeclidinium bromide  1 puff Inhalation Daily   multivitamin with minerals  1 tablet Oral Daily   nutrition supplement (JUVEN)  1 packet Oral BID BM   pantoprazole  40 mg Oral Daily   predniSONE  20 mg Oral Q breakfast   saccharomyces boulardii  250 mg Oral BID   thiamine  100 mg Oral Daily   zinc sulfate  220 mg Oral Daily   Continuous Infusions:  ampicillin-sulbactam (UNASYN) IV 3 g (11/30/20 0929)   [START ON 12/02/2020]  ceFAZolin (ANCEF) IV       LOS: 7 days   Time spent: 38  Nita Sells, MD [facility] To contact the attending provider between 7A-7P or the covering provider during after hours 7P-7A, please log into the web site [email or web address] and access using  universal [place] password for that web site. If you do not have the password, please call the hospital operator.  11/30/2020, 10:04 AM

## 2020-12-01 DIAGNOSIS — B9561 Methicillin susceptible Staphylococcus aureus infection as the cause of diseases classified elsewhere: Secondary | ICD-10-CM | POA: Diagnosis not present

## 2020-12-01 DIAGNOSIS — R7881 Bacteremia: Secondary | ICD-10-CM | POA: Diagnosis not present

## 2020-12-01 LAB — GLUCOSE, CAPILLARY
Glucose-Capillary: 82 mg/dL (ref 70–99)
Glucose-Capillary: 113 mg/dL — ABNORMAL HIGH (ref 70–99)
Glucose-Capillary: 148 mg/dL — ABNORMAL HIGH (ref 70–99)
Glucose-Capillary: 103 mg/dL — ABNORMAL HIGH (ref 70–99)

## 2020-12-01 LAB — SURGICAL PATHOLOGY

## 2020-12-01 MED ORDER — GABAPENTIN 600 MG PO TABS: 300.0000 mg | ORAL_TABLET | Freq: Two times a day (BID) | ORAL | 0 refills | Status: DC

## 2020-12-01 MED ORDER — COVID-19 MRNA VAC-TRIS(PFIZER) 30 MCG/0.3ML IM SUSP
0.3000 mL | Freq: Once | INTRAMUSCULAR | Status: AC
  Administered 2020-12-01: 0.3 mL via INTRAMUSCULAR
  Filled 2020-12-01: qty 0.3

## 2020-12-01 MED ORDER — LOSARTAN POTASSIUM 25 MG PO TABS: 25.0000 mg | ORAL_TABLET | Freq: Every day | ORAL | Status: DC

## 2020-12-01 MED ORDER — CEFAZOLIN IV (FOR PTA / DISCHARGE USE ONLY): 2.0000 g | Freq: Three times a day (TID) | INTRAVENOUS | 0 refills | Status: AC

## 2020-12-01 MED ORDER — ACETAMINOPHEN 325 MG PO TABS: 650.0000 mg | ORAL_TABLET | Freq: Four times a day (QID) | ORAL | Status: AC

## 2020-12-01 MED ORDER — OXYCODONE HCL 5 MG PO TABS: 5.0000 mg | ORAL_TABLET | ORAL | 0 refills | Status: DC | PRN

## 2020-12-01 MED ORDER — SODIUM CHLORIDE 0.9 % IV SOLN
INTRAVENOUS | Status: DC | PRN
  Administered 2020-12-01: 250 mL via INTRAVENOUS
  Administered 2020-12-03: 500 mL via INTRAVENOUS
  Administered 2020-12-04: 250 mL via INTRAVENOUS
  Administered 2020-12-07: 500 mL via INTRAVENOUS
  Administered 2020-12-08: 250 mL via INTRAVENOUS

## 2020-12-01 MED ORDER — POLYETHYLENE GLYCOL 3350 17 G PO PACK: 17.0000 g | PACK | Freq: Every day | ORAL | 0 refills | Status: DC | PRN

## 2020-12-01 MED ORDER — PANTOPRAZOLE SODIUM 40 MG PO TBEC
40.0000 mg | DELAYED_RELEASE_TABLET | Freq: Every day | ORAL | Status: DC
Start: 2020-12-02 — End: 2021-07-19

## 2020-12-01 NOTE — NC FL2 (Signed)
[place] LEVEL OF CARE SCREENING TOOL     IDENTIFICATION  Patient Name: Joel Perez Birthdate: 19-Dec-1945 Sex: male Admission Date (Current Location): 11/23/2020  [facility] and [place] Number:  [employer] and Address:  [facility] [place], [place] [address] [place], [place] [address]      Provider Number: [id number]  Attending Physician Name and Address:  Nita Sells, MD  Relative Name and Phone Number:  Alazar Cherian - [address] [place], [place]  [address]    Current Level of Care: Hospital Recommended Level of Care: [place] Prior Approval Number:    Date Approved/Denied:   PASRR Number: [id number]  Discharge Plan: SNF    Current Diagnoses: Patient Active Problem List   Diagnosis Date Noted   MSSA bacteremia 11/26/2020   Osteomyelitis of ankle or foot, acute, left ([place]) 11/23/2020   RLL pneumonia 11/23/2020   Antibiotic-associated diarrhea 09/16/2020   PICC (peripherally inserted central catheter) in place 09/16/2020   Osteomyelitis ([place]) 08/25/2020   Multiple pulmonary nodules determined by computed tomography of lung 08/14/2020   Pressure injury of skin 07/23/2020   Sepsis ([place]) 07/22/2020   Edema of both legs 12/19/2018   PVD (peripheral vascular disease) ([place]) 12/19/2018   DOE (dyspnea on exertion) 11/14/2018   Chronic respiratory failure with hypoxia ([place]) 04/06/2018   Decreased pedal pulses 01/17/2017   Mild aortic valve regurgitation 01/17/2017   Leg swelling 12/30/2016   Neuropathy, alcoholic ([place]) 27/10/2374   COPD III with marked reversibility 12/27/2012    Orientation RESPIRATION BLADDER Height & Weight     Self, Time, Situation, Place  O2 (4 Liters chronic) Continent, External catheter (Foley cath placed 7/3) Weight: 236 lb 12.4 oz (107.4 kg) Height:     BEHAVIORAL SYMPTOMS/MOOD NEUROLOGICAL BOWEL NUTRITION STATUS      Incontinent Diet (Low-sodium, heart healthy)   AMBULATORY STATUS COMMUNICATION OF NEEDS Skin   Total Care (Patient not currently ambulating due to non-weight bearing- right extremity amputation) Verbally Other (Comment), Wound Vac, Surgical wounds (Stg 3 pressure injury right lower buttocks; MASD left/right buttocks; Right/left posterior Tibial non-pressure wound; Left posterior Tibial-Red; Right pretibial wound; Celulitis left leg with foam dressing; Excoriated groin and buttocks bilateral.)                       Personal Care Assistance Level of Assistance  Bathing, Feeding, Dressing, Total care Bathing Assistance: Limited assistance Feeding assistance: Independent Dressing Assistance: Limited assistance Total Care Assistance: Limited assistance   Functional Limitations Info  Sight, Hearing, Speech Sight Info: Impaired (Wears reading glasses) Hearing Info: Impaired Speech Info: Adequate    SPECIAL CARE FACTORS FREQUENCY  PT (By licensed PT), OT (By licensed OT)     PT Frequency: Evaluated 7/8. PT at SNF a minimum of 5 days a week OT Frequency: OT evaluation pending            Contractures Contractures Info: Not present    Additional Factors Info  Code Status, Allergies Code Status Info: Full Allergies Info: Other, Codeine, Sulfa Antibiotics           Current Medications (12/01/2020):  This is the current hospital active medication list Current Facility-Administered Medications  Medication Dose Route Frequency Provider Last Rate Last Admin   (feeding supplement) PROSource Plus liquid 30 mL  30 mL Oral BID BM Vann, Jessica U, DO   30 mL at 12/01/20 0850   acetaminophen (TYLENOL) tablet 650 mg  650  mg Oral Q6H Cherre Robins, MD   650 mg at 12/01/20 1305   albuterol (VENTOLIN HFA) 108 (90 Base) MCG/ACT inhaler 2 puff  2 puff Inhalation Q6H PRN Vashti Hey, MD   2 puff at 11/29/20 1538   alum & mag hydroxide-simeth (MAALOX/MYLANTA) 200-200-20 MG/5ML suspension 15-30 mL  15-30 mL Oral Q2H PRN  Cherre Robins, MD       Ampicillin-Sulbactam (UNASYN) 3 g in sodium chloride 0.9 % 100 mL IVPB  3 g Intravenous Q6H Robertson, Crystal S, RPH 200 mL/hr at 12/01/20 1154 3 g at 12/01/20 1154   ascorbic acid (VITAMIN C) tablet 500 mg  500 mg Oral Daily Irene Pap N, DO   500 mg at 12/01/20 0850   bisacodyl (DULCOLAX) EC tablet 5 mg  5 mg Oral Daily PRN Cherre Robins, MD   5 mg at 11/30/20 1324   bisacodyl (DULCOLAX) suppository 10 mg  10 mg Rectal Daily PRN Vashti Hey, MD       [START ON 12/02/2020] ceFAZolin (ANCEF) IVPB 2g/100 mL premix  2 g Intravenous Q8H Robertson, Crystal S, RPH       Chlorhexidine Gluconate Cloth 2 % PADS 6 each  6 each Topical Daily Nita Sells, MD       cholecalciferol (VITAMIN D3) tablet 1,000 Units  1,000 Units Oral Daily Irene Pap N, DO   1,000 Units at 12/01/20 0850   cyclobenzaprine (FLEXERIL) tablet 10 mg  10 mg Oral QHS Vann, Jessica U, DO   10 mg at 11/30/20 2128   docusate sodium (COLACE) capsule 100 mg  100 mg Oral Daily Cherre Robins, MD   100 mg at 12/01/20 0850   feeding supplement (ENSURE ENLIVE / ENSURE PLUS) liquid 237 mL  237 mL Oral TID BM Vann, Jessica U, DO   237 mL at 40/10/27 2536   folic acid (FOLVITE) tablet 1 mg  1 mg Oral Daily Lisco, Carole N, DO   1 mg at 12/01/20 0850   furosemide (LASIX) tablet 20 mg  20 mg Oral Daily Nita Sells, MD   20 mg at 12/01/20 0850   gabapentin (NEURONTIN) tablet 300 mg  300 mg Oral BID Nita Sells, MD   300 mg at 12/01/20 0849   guaiFENesin-dextromethorphan (ROBITUSSIN DM) 100-10 MG/5ML syrup 10 mL  10 mL Oral Q4H PRN Irene Pap N, DO       hydrALAZINE (APRESOLINE) injection 5 mg  5 mg Intravenous Q20 Min PRN Cherre Robins, MD       HYDROmorphone (DILAUDID) injection 0.5 mg  0.5 mg Intravenous Q4H PRN Cherre Robins, MD   0.5 mg at 12/01/20 1305   liver oil-zinc oxide (DESITIN) 40 % ointment 1 application  1 application Topical Daily PRN Eulogio Bear U, DO    1 application at 64/40/34 1454   losartan (COZAAR) tablet 25 mg  25 mg Oral Daily Nita Sells, MD   25 mg at 12/01/20 0850   magnesium citrate solution 1 Bottle  1 Bottle Oral Once PRN Vashti Hey, MD       melatonin tablet 10 mg  10 mg Oral QHS Pierce, Dwayne A, RPH   10 mg at 11/30/20 2128   metoprolol tartrate (LOPRESSOR) injection 2-5 mg  2-5 mg Intravenous Q2H PRN Cherre Robins, MD       mometasone-formoterol [facility]) 100-5 MCG/ACT inhaler 2 puff  2 puff Inhalation BID Skeet Simmer, RPH   2 puff at 12/01/20 [phone]  And   umeclidinium bromide (INCRUSE ELLIPTA) 62.5 MCG/INH 1 puff  1 puff Inhalation Daily Skeet Simmer, RPH   1 puff at 12/01/20 0857   multivitamin with minerals tablet 1 tablet  1 tablet Oral Daily Geradine Girt, DO   1 tablet at 12/01/20 0923   nutrition supplement (JUVEN) (JUVEN) powder packet 1 packet  1 packet Oral BID BM Cherre Robins, MD   1 packet at 12/01/20 0850   ondansetron (ZOFRAN) injection 4 mg  4 mg Intravenous Q6H PRN Cherre Robins, MD       oxyCODONE (Oxy IR/ROXICODONE) immediate release tablet 5-10 mg  5-10 mg Oral Q4H PRN Cherre Robins, MD   10 mg at 11/30/20 2128   pantoprazole (PROTONIX) EC tablet 40 mg  40 mg Oral Daily Cherre Robins, MD   40 mg at 12/01/20 0850   phenol (CHLORASEPTIC) mouth spray 1 spray  1 spray Mouth/Throat PRN Cherre Robins, MD       polyethylene glycol (MIRALAX / GLYCOLAX) packet 17 g  17 g Oral Daily PRN Bonnell Public Tublu, MD       potassium chloride SA (KLOR-CON) CR tablet 20-40 mEq  20-40 mEq Oral Daily PRN Cherre Robins, MD       predniSONE (DELTASONE) tablet 20 mg  20 mg Oral Q breakfast Bonnell Public Tublu, MD   20 mg at 12/01/20 0850   saccharomyces boulardii (FLORASTOR) capsule 250 mg  250 mg Oral BID Eulogio Bear U, DO   250 mg at 12/01/20 0850   thiamine tablet 100 mg  100 mg Oral Daily Irene Pap N, DO   100 mg at 12/01/20 0850   zinc sulfate capsule 220 mg   220 mg Oral Daily Irene Pap N, DO   220 mg at 12/01/20 3007     Discharge Medications: Please see discharge summary for a list of discharge medications.  Relevant Imaging Results:  Relevant Lab Results:   Additional Information ss#[id number]. Patient has had the [place] vaccinations: 06/15/19 and 07/06/19. The Moderna booster given on 04/07/20. Patient right BKA has foam dressing and a wound vac. Patient has sacral wound-upper posterior thighs  [name] Mila Homer, LCSW

## 2020-12-01 NOTE — Progress Notes (Addendum)
Progress Note    12/01/2020 12:39 PM 4 Days Post-Op  Subjective:  No significant post-op pain. Wife at bedside.   Vitals:   12/01/20 0621 12/01/20 0907  BP: 117/70 117/90  Pulse: (!) 106 (!) 105  Resp: 20 18  Temp: 97.6 F (36.4 C) 97.6 F (36.4 C)  SpO2: 92% 96%    Physical Exam:  General appearance: Awake, alert in no apparent distress Cardiac: Heart rate and rhythm are regular Respirations: Nonlabored Extremities: Ampu-shield, sock and VAC dressing removed. Amputation site incision is well approximated without hematoma. Small amount of oozing. Erythematous patch noted along lateral posterior flap. Anterior and posterior flaps are warm and well-perfused   medial  lateral   CBC    Component Value Date/Time   WBC 19.6 (H) 11/29/2020 0206   RBC 3.63 (L) 11/29/2020 0206   HGB 10.0 (L) 11/29/2020 0206   HCT 30.4 (L) 11/29/2020 0206   PLT 481 (H) 11/29/2020 0206   MCV 83.7 11/29/2020 0206   MCH 27.5 11/29/2020 0206   MCHC 32.9 11/29/2020 0206   RDW 17.2 (H) 11/29/2020 0206   LYMPHSABS 1.7 11/28/2020 0418   MONOABS 0.7 11/28/2020 0418   EOSABS 0.0 11/28/2020 0418   BASOSABS 0.1 11/28/2020 0418    BMET    Component Value Date/Time   NA 138 11/30/2020 0446   NA 140 01/04/2019 1451   K 3.9 11/30/2020 0446   CL 103 11/30/2020 0446   CO2 29 11/30/2020 0446   GLUCOSE 92 11/30/2020 0446   BUN 15 11/30/2020 0446   BUN 12 01/04/2019 1451   CREATININE 0.77 11/30/2020 0446   CALCIUM 7.9 (L) 11/30/2020 0446   GFRNONAA >60 11/30/2020 0446   GFRAA 91 01/04/2019 1451     Intake/Output Summary (Last 24 hours) at 12/01/2020 1239 Last data filed at 12/01/2020 0900 Gross per 24 hour  Intake 1120 ml  Output 1425 ml  Net -305 ml    HOSPITAL MEDICATIONS Scheduled Meds:  (feeding supplement) PROSource Plus  30 mL Oral BID BM   acetaminophen  650 mg Oral Q6H   vitamin C  500 mg Oral Daily   Chlorhexidine Gluconate Cloth  6 each Topical Daily   cholecalciferol   1,000 Units Oral Daily   cyclobenzaprine  10 mg Oral QHS   docusate sodium  100 mg Oral Daily   feeding supplement  237 mL Oral TID BM   folic acid  1 mg Oral Daily   furosemide  20 mg Oral Daily   gabapentin  300 mg Oral BID   losartan  25 mg Oral Daily   melatonin  10 mg Oral QHS   mometasone-formoterol  2 puff Inhalation BID   And   umeclidinium bromide  1 puff Inhalation Daily   multivitamin with minerals  1 tablet Oral Daily   nutrition supplement (JUVEN)  1 packet Oral BID BM   pantoprazole  40 mg Oral Daily   predniSONE  20 mg Oral Q breakfast   saccharomyces boulardii  250 mg Oral BID   thiamine  100 mg Oral Daily   zinc sulfate  220 mg Oral Daily   Continuous Infusions:  ampicillin-sulbactam (UNASYN) IV 3 g (12/01/20 1154)   [START ON 12/02/2020]  ceFAZolin (ANCEF) IV     PRN Meds:.albuterol, alum & mag hydroxide-simeth, bisacodyl, bisacodyl, guaiFENesin-dextromethorphan, hydrALAZINE, HYDROmorphone (DILAUDID) injection, liver oil-zinc oxide, magnesium citrate, metoprolol tartrate, ondansetron, oxyCODONE, phenol, polyethylene glycol, potassium chloride  Assessment and Plan: POD 4 Right foot osteomyelitis without options for  revascularization s/p Right BKA. Flaps viable. Foam border dressing and shrinker sock applied. Keep Ampushield on right residual limb except for bathing/dressing.  OK to discharge to SNF when bed available. Will continue to  monitor flaps while inpatient. Staples will remain for 4-6 weeks.  Risa Grill, PA-C [facility] [phone] 12/01/2020  12:39 PM   Agree with above.  Skin edge marginal on the lateral aspect.  Watchful waiting for now.  Ok for d/c as this will declare itself over the next few weeks.  Ruta Hinds, MD [facility] [place] Office: [phone]

## 2020-12-01 NOTE — Progress Notes (Signed)
Patient seen and examined and reviewed today It looks like he is not been approved for CIR because of functional debility and inability to participate for 3 hours so he will be discharging to skilled facility whenever this can be arranged  I have ordered the COVID in anticipation of the same Please see my discharge summary dated today for details with regards to history  Verneita Griffes, MD [facility] 1:48 PM

## 2020-12-01 NOTE — Discharge Summary (Signed)
Physician Discharge Summary  Joel Perez QQV:[id number] DOB: 21-Apr-1946 DOA: 11/23/2020  PCP: [name], MD  Admit date: 11/23/2020 Discharge date: 12/01/2020  Time spent: 37 minutes  Recommendations for Outpatient Follow-up:  Requires outpatient amputation management/postop care as per vascular surgeon Dr. Vanessa Barbara to remain in wound until 12/27/2020, weightbearing status deferred to surgeon's but probably nonweightbearing Continue Ancef as per OPAT orders in the outpatient setting for at least 6 weeks ending on 01/09/2019-[facility] physician Dr. Juleen China to follow-up in the outpatient setting regarding extension of this versus not Needs CBC Chem-12 1 week in outpatient setting Needs outpatient follow-up with Dr. [name] (pulmonology) regarding tapering of prednisone and follow-up right upper lobe 1.5 spiculated mass-I will CC him for this to be arranged Note dosage changes of various medications as per [facility] Will need outpatient discussion regarding prediabetes and sugars in addition to BMI 32 although suspect will be difficult with amputation for significant physical activity at this time Requires oxygen 4 L at all times given advanced COPD    Discharge Diagnoses:  MAIN problem for hospitalization   nonhealing right fifth toe resulting in R BKA 11/25/2020 MSSA bacteremia still present-extension of course of antibiotics till 8/18 PICC line placed 7/10 to be removed at the end of therapy or as per infectious disease physician Dr. Juleen China  Please see below for itemized issues addressed in [place] [place]- refer to other progress notes for clarity if needed  Discharge Condition: Fair  Diet recommendation: Heart healthy low-salt-consider diabetic diet in the outpatient setting  Filed Weights   11/23/20 1524 11/29/20 0542 12/01/20 3329  Weight: 103.3 kg 106.3 kg 107.4 kg    History of present illness:  MSSA s/p R sided BKA 7/5 PM Blood culture X2 from 7/3 confirmed staph-surveillance cultures  7/5 neg Echocardiogram 7/6 no valvular vegetations--- TEE contemplated but deferred secondary to high oxygen requirement and risk of procedure therefore ID rec's 6 wk Abx ANCEF ending 01/08/21--PICC  placed 7/10-please remove PICC line once no longer required Oxy IR 5-10 every 4 --Gabapentin 300 twice daily for nerve pain, continue Flexeril 10 at bedtime in addition Stable for DC to skilled facility when bed available R LL infiltrate?  Postobstructive CXR 7/7 possible PNA-[unlikely--no sputum, no cough worse than prior]--completed 7/11-Ancef as above RUL 1.5 spiculated mass Severe COPD on 4 L of oxygen Outpatient follow-up Dr. Molli Posey will CC Dr. [name] on this discharge summary for care coordination regarding mass, COPD etc. Attempt in the outpatient setting to de-escalate steroids and will CC Dr. [name] to coordinate this Recent COVID-19 infection Treated--curently relatively asymptomatic HFpEF EF 60-65% Losartan resumed at 25 mg dose-May need to titrate in the outpatient Lasix 20 on hold Revatio 20 on hold till Op re-evl ?  DM TY 2 Obesity BMI 32 Not on any prior to admission meds-sugars trending below 180 Discontinue sliding scale   Procedures: BKA as above  Consultations: Vascular surgery  Discharge Exam: Vitals:   12/01/20 0621 12/01/20 0907  BP: 117/70 117/90  Pulse: (!) 106 (!) 105  Resp: 20 18  Temp: 97.6 F (36.4 C) 97.6 F (36.4 C)  SpO2: 92% 96%    Subj on day of d/c   Awake coherent-eating drinking well  General Exam on discharge  No distress EOMI NCAT no focal deficit Moving 4 limbs equally although pain in right stump to some degree with movement Chest is clear no rales no rhonchi On oxygen No mass no swelling no rash Picture of wound from 7/10 included as below  Discharge Instructions   Discharge Instructions     [facility] pharmacist to adjust dose for Vancomycin, Aminoglycosides and other anti-infective therapies as requested by  physician.   Complete by: As directed    [facility] to provide Cath Flo 2mg    Complete by: As directed    Administer for PICC line occlusion and as ordered by physician for other access device issues.   Anaphylaxis Kit: Provided to treat any anaphylactic reaction to the medication being provided to the patient if First Dose or when requested by physician   Complete by: As directed    Epinephrine 1mg /ml vial / amp: Administer 0.3mg  (0.54ml) subcutaneously once for moderate to severe anaphylaxis, nurse to call physician and pharmacy when reaction occurs and call 911 if needed for immediate care   Diphenhydramine 50mg /ml IV vial: Administer 25-50mg  IV/IM PRN for first dose reaction, rash, itching, mild reaction, nurse to call physician and pharmacy when reaction occurs   Sodium Chloride 0.9% NS 513ml IV: Administer if needed for hypovolemic blood pressure drop or as ordered by physician after call to physician with anaphylactic reaction   Change dressing on IV access line weekly and PRN   Complete by: As directed    Diet - low sodium heart healthy   Complete by: As directed    Discharge wound care:   Complete by: As directed    As above   Flush IV access with Sodium Chloride 0.9% and Heparin 10 units/ml or 100 units/ml   Complete by: As directed    Home infusion instructions - [facility]   Complete by: As directed    Instructions: Flush IV access with Sodium Chloride 0.9% and Heparin 10units/ml or 100units/ml   Change dressing on IV access line: Weekly and PRN   Instructions Cath Flo 2mg : Administer for PICC Line occlusion and as ordered by physician for other access device   [facility] pharmacist to adjust dose for: Vancomycin, Aminoglycosides and other anti-infective therapies as requested by physician   Increase activity slowly   Complete by: As directed    Method of administration may be changed at the discretion of home infusion pharmacist based upon  assessment of the patient and/or caregiver's ability to self-administer the medication ordered   Complete by: As directed       Allergies as of 12/01/2020       Reactions   Other Swelling, Other (See Comments)   Farmed Fish (tightness in throat & lip swelling)   Codeine Rash   Sulfa Antibiotics Hives        Medication List     STOP taking these medications    loperamide 2 MG capsule Commonly known as: IMODIUM   oxyCODONE-acetaminophen 10-325 MG tablet Commonly known as: PERCOCET   saccharomyces boulardii 250 MG capsule Commonly known as: FLORASTOR   sildenafil 20 MG tablet Commonly known as: REVATIO       TAKE these medications    acetaminophen 325 MG tablet Commonly known as: TYLENOL Take 2 tablets (650 mg total) by mouth every 6 (six) hours.   albuterol (2.5 MG/3ML) 0.083% nebulizer solution Commonly known as: PROVENTIL USE 1 VIAL IN NEBULIZER EVERY 6 HOURS AS NEEDED FOR WHEEZING AND FOR SHORTNESS OF BREATH What changed: See the new instructions.   ProAir HFA 108 (90 Base) MCG/ACT inhaler Generic drug: albuterol INHALE 2 PUFFS BY MOUTH EVERY 6 HOURS AS NEEDED FOR WHEEZING FOR SHORTNESS OF BREATH What changed: See the new instructions.   aspirin  EC 81 MG tablet Take 81 mg by mouth daily. Swallow whole.   atorvastatin 40 MG tablet Commonly known as: LIPITOR TAKE 1 TABLET BY MOUTH ONCE DAILY . APPOINTMENT REQUIRED FOR FUTURE REFILLS What changed: See the new instructions.   Breztri Aerosphere 160-9-4.8 MCG/ACT Aero Generic drug: Budeson-Glycopyrrol-Formoterol Take 2 puffs first thing in am and then another 2 puffs about 12 hours later. What changed:  how much to take how to take this when to take this additional instructions   ceFAZolin  IVPB Commonly known as: ANCEF Inject 2 g into the vein every 8 (eight) hours. Indication:  osteomyelitis and recurrent MSSA bacteremia First Dose: Yes Last Day of Therapy:  01/08/2021 Labs - Once weekly:  CBC/D  and BMP, Labs - Every other week:  ESR and CRP Method of administration: IV Push Method of administration may be changed at the discretion of home infusion pharmacist based upon assessment of the patient and/or caregiver's ability to self-administer the medication ordered. Start taking on: December 02, 2020   cyclobenzaprine 10 MG tablet Commonly known as: FLEXERIL Take 10 mg by mouth at bedtime.   furosemide 20 MG tablet Commonly known as: LASIX TAKE 1 TABLET BY MOUTH ONCE DAILY What changed:  how much to take how to take this when to take this additional instructions   furosemide 20 MG tablet Commonly known as: LASIX Take 1 tablet (20 mg total) by mouth daily. What changed: Another medication with the same name was changed. Make sure you understand how and when to take each.   gabapentin 600 MG tablet Commonly known as: NEURONTIN Take 0.5 tablets (300 mg total) by mouth 2 (two) times daily. What changed:  how much to take when to take this   losartan 25 MG tablet Commonly known as: COZAAR Take 1 tablet (25 mg total) by mouth daily. Start taking on: December 02, 2020 What changed:  medication strength how much to take   Melatonin 10 MG Tabs Take 1 tablet by mouth at bedtime.   multivitamin with minerals Tabs tablet Take 1 tablet by mouth daily.   oxyCODONE 5 MG immediate release tablet Commonly known as: Oxy IR/ROXICODONE Take 1-2 tablets (5-10 mg total) by mouth every 4 (four) hours as needed for severe pain.   pantoprazole 40 MG tablet Commonly known as: PROTONIX Take 1 tablet (40 mg total) by mouth daily. Start taking on: December 02, 2020   polyethylene glycol 17 g packet Commonly known as: MIRALAX / GLYCOLAX Take 17 g by mouth daily as needed for mild constipation.   predniSONE 10 MG tablet Commonly known as: DELTASONE 2 until better then one daily What changed: when to take this   sodium chloride 0.65 % nasal spray Commonly known as: OCEAN Place 1 spray  into the nose daily as needed for congestion.   vitamin B-12 1000 MCG tablet Commonly known as: CYANOCOBALAMIN Take 1,000 mcg by mouth daily.   vitamin C 500 MG tablet Commonly known as: ASCORBIC ACID Take 500 mg by mouth daily.               Discharge Care Instructions  (From admission, onward)           Start     Ordered   12/01/20 0000  Change dressing on IV access line weekly and PRN  (Home infusion instructions - [facility] )        12/01/20 1331   12/01/20 0000  Discharge wound care:  Comments: As above   12/01/20 1331           Allergies  Allergen Reactions   Other Swelling and Other (See Comments)    Farmed Fish (tightness in throat & lip swelling)   Codeine Rash   Sulfa Antibiotics Hives    Follow-up Information     [facility] Follow up in 1 month(s).   Why: The office will call the patient with an appointment Contact information: [address], [place], [place] Follow up.   Specialties: Infectious Diseases, Internal Medicine Why: 12/26/20 at 9am. Please call to schedule if you are not able to make this appointment. Contact information: [place] [address] [phone]                  The results of significant diagnostics from this hospitalization (including imaging, microbiology, ancillary and laboratory) are listed below for reference.    Significant Diagnostic Studies: DG Chest 1 View  Result Date: 11/23/2020 CLINICAL DATA:  Concern for osteomyelitis of the RIGHT foot. Recent partial RIGHT foot amputation. Weakness. EXAM: CHEST  1 VIEW COMPARISON:  07/22/2020 FINDINGS: Stable cardiomegaly. Patchy opacity is identified in the RIGHT lung base, consistent with infectious infiltrate. There is no pulmonary edema. Within the RIGHT UPPER lobe, a 1.5 centimeter spiculated mass and adjacent satellite nodule are  again noted. IMPRESSION: 1. RIGHT LOWER lobe infiltrates. 2. RIGHT UPPER lobe masses, as seen on prior exams. Electronically Signed   By: Nolon Nations M.D.   On: 11/23/2020 14:35   DG Chest 2 View  Result Date: 11/27/2020 CLINICAL DATA:  Pneumonia follow-up EXAM: CHEST - 2 VIEW COMPARISON:  11/23/2020 FINDINGS: Persistent opacity at the right lung base. Nodular opacities are identified in the right lung. Increased density at the left lung base. Small bilateral pleural effusions. Similar cardiomediastinal contours. No pneumothorax. IMPRESSION: Persistent opacities at the right lung base and right lung nodules. Increased left basilar atelectasis/consolidation. Small bilateral pleural effusions. Electronically Signed   By: Macy Mis M.D.   On: 11/27/2020 09:08   MR FOOT RIGHT WO CONTRAST  Result Date: 11/25/2020 CLINICAL DATA:  History of partial fifth ray amputation 08/28/2020. Pain, swelling and open wound with hernial and discharge for 2 days. EXAM: MRI OF THE RIGHT FOREFOOT WITHOUT CONTRAST TECHNIQUE: Multiplanar, multisequence MR imaging of the right foot was performed. No intravenous contrast was administered. COMPARISON:  Radiograph 11/23/2020 FINDINGS: Extensive and advanced changes of osteomyelitis involving the residual fifth metatarsal which is destroyed. Adjacent fluid collection measures 4.5 cm and is highly suspicious for an abscess. There is also an open wound in this area. There is severe diffuse cellulitis and myofasciitis. The other bony structures are intact. IMPRESSION: 1. Extensive and advanced changes of osteomyelitis involving the residual fifth metatarsal. 2. Adjacent 4.5 cm fluid collection highly suspicious for an abscess. 3. Severe diffuse cellulitis and myofasciitis. Electronically Signed   By: Marijo Sanes M.D.   On: 11/25/2020 07:55   DG Foot Complete Right  Result Date: 11/23/2020 CLINICAL DATA:  Recent partial amputation, concern for osteomyelitis. EXAM: RIGHT FOOT  COMPLETE - 3+ VIEW COMPARISON:  08/25/2020 FINDINGS: Prior amputation of the small toe. Recognizable 3.1 cm proximal portion of the fifth metatarsal noted, lung distal margin of this there is a 3.8 cm region of amorphous hazy calcification suspicious for a destroyed bony fragment from osteomyelitis, less likely to be heterotopic  ossification along the prior amputation. No appreciable destructive findings along the remaining metatarsals or phalanges. Mildly exaggerated longitudinal arch of the foot on the lateral projection which is not obtained with the patient standing. Extensive soft tissue swelling along the forefoot especially dorsally. Irregularity of the cutaneous surface along the amputation site and lateral to the fifth metatarsal. Small Achilles calcaneal spur. IMPRESSION: 1. 3.8 cm region of amorphous hazy calcification corresponding to the shaft of the fifth metatarsal, probably bony destruction from active osteomyelitis and less likely to from heterotopic ossification along the site of prior amputation. There is a 3.1 cm fragment of proximal fifth metatarsal which appears recognizable, proximal to this hazy calcification. Soft tissue swelling and irregularity of the overlying soft tissues. Electronically Signed   By: Van Clines M.D.   On: 11/23/2020 15:10   ECHOCARDIOGRAM COMPLETE  Result Date: 11/26/2020    ECHOCARDIOGRAM REPORT   Patient Name:   [name] Date of Exam: 11/26/2020 Medical Rec #:  [id number]     Height:       70.0 in Accession #:    [id number]    Weight:       227.7 lb Date of Birth:  1945/06/04     BSA:          2.206 m Patient Age:    15 years      BP:           135/63 mmHg Patient Gender: M             HR:           92 bpm. Exam Location:  Inpatient Procedure: 2D Echo, Cardiac Doppler and Color Doppler Indications:    bacteremia  History:        Patient has prior history of Echocardiogram examinations, most                 recent 07/22/2020. Risk Factors:Former Smoker.   Sonographer:    Cammy Brochure Referring Phys: [id number] [place]  1. Left ventricular ejection fraction, by estimation, is 60 to 65%. The left ventricle has normal function. The left ventricle has no regional wall motion abnormalities. There is mild left ventricular hypertrophy. Left ventricular diastolic parameters were normal.  2. Right ventricular systolic function is normal. The right ventricular size is normal.  3. The mitral valve is normal in structure. Trivial mitral valve regurgitation. No evidence of mitral stenosis.  4. The aortic valve is calcified. There is moderate calcification of the aortic valve. There is moderate thickening of the aortic valve. Aortic valve regurgitation is trivial. Mild aortic valve stenosis.  5. The inferior vena cava is normal in size with greater than 50% respiratory variability, suggesting right atrial pressure of 3 mmHg. Comparison(s): No significant change from prior study. Conclusion(s)/Recommendation(s): No evidence of valvular vegetations on this transthoracic echocardiogram. Would recommend a transesophageal echocardiogram to exclude infective endocarditis if clinically indicated. FINDINGS  Left Ventricle: Left ventricular ejection fraction, by estimation, is 60 to 65%. The left ventricle has normal function. The left ventricle has no regional wall motion abnormalities. The left ventricular internal cavity size was normal in size. There is  mild left ventricular hypertrophy. Left ventricular diastolic parameters were normal. Right Ventricle: The right ventricular size is normal. No increase in right ventricular wall thickness. Right ventricular systolic function is normal. Left Atrium: Left atrial size was normal in size. Right Atrium: Right atrial size was normal in size. Pericardium: There is no evidence of pericardial  effusion. Mitral Valve: The mitral valve is normal in structure. Trivial mitral valve regurgitation. No evidence of mitral valve  stenosis. Tricuspid Valve: The tricuspid valve is normal in structure. Tricuspid valve regurgitation is trivial. No evidence of tricuspid stenosis. Aortic Valve: The aortic valve is calcified. There is moderate calcification of the aortic valve. There is moderate thickening of the aortic valve. Aortic valve regurgitation is trivial. Mild aortic stenosis is present. Aortic valve mean gradient measures 5.7 mmHg. Aortic valve peak gradient measures 10.2 mmHg. Aortic valve area, by VTI measures 2.80 cm. Pulmonic Valve: The pulmonic valve was not well visualized. Pulmonic valve regurgitation is not visualized. Aorta: The aortic root and ascending aorta are structurally normal, with no evidence of dilitation. Venous: The inferior vena cava is normal in size with greater than 50% respiratory variability, suggesting right atrial pressure of 3 mmHg. IAS/Shunts: The atrial septum is grossly normal.  LEFT VENTRICLE PLAX 2D LVIDd:         4.50 cm LVIDs:         3.00 cm LV PW:         1.40 cm LV IVS:        0.90 cm LVOT diam:     2.10 cm LV SV:         75 LV SV Index:   34 LVOT Area:     3.46 cm  RIGHT VENTRICLE          IVC RV Basal diam:  3.50 cm  IVC diam: 0.70 cm LEFT ATRIUM             Index       RIGHT ATRIUM           Index LA diam:        3.00 cm 1.36 cm/m  RA Area:     11.50 cm LA Vol (A2C):   54.9 ml 24.89 ml/m RA Volume:   23.70 ml  10.74 ml/m LA Vol (A4C):   50.9 ml 23.08 ml/m LA Biplane Vol: 54.3 ml 24.62 ml/m  AORTIC VALVE AV Area (Vmax):    2.86 cm AV Area (Vmean):   2.82 cm AV Area (VTI):     2.80 cm AV Vmax:           159.33 cm/s AV Vmean:          110.000 cm/s AV VTI:            0.269 m AV Peak Grad:      10.2 mmHg AV Mean Grad:      5.7 mmHg LVOT Vmax:         131.67 cm/s LVOT Vmean:        89.567 cm/s LVOT VTI:          0.217 m LVOT/AV VTI ratio: 0.81  AORTA Ao Root diam: 3.30 cm Ao Asc diam:  2.90 cm  SHUNTS Systemic VTI:  0.22 m Systemic Diam: 2.10 cm Buford Dresser MD Electronically  signed by Buford Dresser MD Signature Date/Time: 11/26/2020/2:45:08 PM    Final    [place] EKG Site Rite  Result Date: 11/28/2020 If Site Rite image not attached, placement could not be confirmed due to current cardiac rhythm.   Microbiology: Recent Results (from the past 240 hour(s))  Blood culture (routine single)     Status: Abnormal   Collection Time: 11/23/20  1:58 PM   Specimen: BLOOD RIGHT HAND  Result Value Ref Range Status   Specimen Description BLOOD RIGHT HAND  Final  Special Requests   Final    BOTTLES DRAWN AEROBIC AND ANAEROBIC Blood Culture adequate volume   Culture  Setup Time   Final    GRAM POSITIVE COCCI IN CLUSTERS AEROBIC BOTTLE ONLY CRITICAL RESULT CALLED TO, READ BACK BY AND VERIFIED WITH: Lanae Boast [facility] 2011 11/24/20 A BROWNING Performed at [facility], [place] [address], [place], [place] [address]    Culture STAPHYLOCOCCUS AUREUS (A)  Final   Report Status 11/26/2020 FINAL  Final   Organism ID, Bacteria STAPHYLOCOCCUS AUREUS  Final      Susceptibility   Staphylococcus aureus - MIC*    CIPROFLOXACIN <=0.5 SENSITIVE Sensitive     ERYTHROMYCIN >=8 RESISTANT Resistant     GENTAMICIN <=0.5 SENSITIVE Sensitive     OXACILLIN <=0.25 SENSITIVE Sensitive     TETRACYCLINE <=1 SENSITIVE Sensitive     VANCOMYCIN <=0.5 SENSITIVE Sensitive     TRIMETH/SULFA <=10 SENSITIVE Sensitive     CLINDAMYCIN >=8 RESISTANT Resistant     RIFAMPIN <=0.5 SENSITIVE Sensitive     Inducible Clindamycin NEGATIVE Sensitive     * STAPHYLOCOCCUS AUREUS  Blood Culture ID Panel (Reflexed)     Status: Abnormal   Collection Time: 11/23/20  1:58 PM  Result Value Ref Range Status   Enterococcus faecalis NOT DETECTED NOT DETECTED Final   Enterococcus Faecium NOT DETECTED NOT DETECTED Final   Listeria monocytogenes NOT DETECTED NOT DETECTED Final   Staphylococcus species DETECTED (A) NOT DETECTED Final    Comment: CRITICAL RESULT CALLED TO, READ BACK BY AND VERIFIED WITH: K HURTH PHARMD  2011 11/24/20 A BROWNING    Staphylococcus aureus (BCID) DETECTED (A) NOT DETECTED Final    Comment: CRITICAL RESULT CALLED TO, READ BACK BY AND VERIFIED WITH: Lanae Boast PHARMD 2011 11/24/20 A BROWNING    Staphylococcus epidermidis NOT DETECTED NOT DETECTED Final   Staphylococcus lugdunensis NOT DETECTED NOT DETECTED Final   Streptococcus species NOT DETECTED NOT DETECTED Final   Streptococcus agalactiae NOT DETECTED NOT DETECTED Final   Streptococcus pneumoniae NOT DETECTED NOT DETECTED Final   Streptococcus pyogenes NOT DETECTED NOT DETECTED Final   A.calcoaceticus-baumannii NOT DETECTED NOT DETECTED Final   Bacteroides fragilis NOT DETECTED NOT DETECTED Final   Enterobacterales NOT DETECTED NOT DETECTED Final   Enterobacter cloacae complex NOT DETECTED NOT DETECTED Final   Escherichia coli NOT DETECTED NOT DETECTED Final   Klebsiella aerogenes NOT DETECTED NOT DETECTED Final   Klebsiella oxytoca NOT DETECTED NOT DETECTED Final   Klebsiella pneumoniae NOT DETECTED NOT DETECTED Final   Proteus species NOT DETECTED NOT DETECTED Final   Salmonella species NOT DETECTED NOT DETECTED Final   Serratia marcescens NOT DETECTED NOT DETECTED Final   Haemophilus influenzae NOT DETECTED NOT DETECTED Final   Neisseria meningitidis NOT DETECTED NOT DETECTED Final   Pseudomonas aeruginosa NOT DETECTED NOT DETECTED Final   Stenotrophomonas maltophilia NOT DETECTED NOT DETECTED Final   Candida albicans NOT DETECTED NOT DETECTED Final   Candida auris NOT DETECTED NOT DETECTED Final   Candida glabrata NOT DETECTED NOT DETECTED Final   Candida krusei NOT DETECTED NOT DETECTED Final   Candida parapsilosis NOT DETECTED NOT DETECTED Final   Candida tropicalis NOT DETECTED NOT DETECTED Final   Cryptococcus neoformans/gattii NOT DETECTED NOT DETECTED Final   Meth resistant mecA/C and MREJ NOT DETECTED NOT DETECTED Final    Comment: Performed at [facility], [place]. [address] [place],  [address]   Culture, blood (routine x 2)     Status:  None   Collection Time: 11/23/20  3:39 PM   Specimen: BLOOD  Result Value Ref Range Status   Specimen Description BLOOD SITE NOT SPECIFIED  Final   Special Requests   Final    BOTTLES DRAWN AEROBIC AND ANAEROBIC Blood Culture results may not be optimal due to an excessive volume of blood received in culture bottles   Culture   Final    NO GROWTH 5 DAYS Performed at [facility], [place] [address] [place], [place] [address]    Report Status 11/28/2020 FINAL  Final  Culture, blood (routine x 2)     Status: None   Collection Time: 11/23/20  3:57 PM   Specimen: BLOOD  Result Value Ref Range Status   Specimen Description BLOOD SITE NOT SPECIFIED  Final   Special Requests   Final    BOTTLES DRAWN AEROBIC AND ANAEROBIC Blood Culture results may not be optimal due to an inadequate volume of blood received in culture bottles   Culture   Final    NO GROWTH 5 DAYS Performed at [place] [facility], [place] [address], [place], [place] [address]    Report Status 11/28/2020 FINAL  Final  Resp Panel by RT-PCR (Flu A&B, Covid) Nasopharyngeal Swab     Status: Abnormal   Collection Time: 11/23/20  7:27 PM   Specimen: Nasopharyngeal Swab; Nasopharyngeal(NP) swabs in vial transport medium  Result Value Ref Range Status   SARS Coronavirus 2 by RT PCR POSITIVE (A) NEGATIVE Final    Comment: RESULT CALLED TO, READ BACK BY AND VERIFIED WITH: RN BOKIAGON C. BY MESSAN H. AT 2055 ON 11/23/2020 (NOTE) SARS-CoV-2 target nucleic acids are DETECTED.  The SARS-CoV-2 RNA is generally detectable in upper respiratory specimens during the acute phase of infection. Positive results are indicative of the presence of the identified virus, but do not rule out bacterial infection or co-infection with other pathogens not detected by the test. Clinical correlation with patient history and other diagnostic information is necessary to determine patient infection status. The  expected result is Negative.  Fact Sheet for Patients: EntrepreneurPulse.com.au  Fact Sheet for Healthcare Providers: IncredibleEmployment.be  This test is not yet approved or cleared by the [place] FDA and  has been authorized for detection and/or diagnosis of SARS-CoV-2 by FDA under an Emergency Use Authorization (EUA).  This EUA will remain in effect (meaning th is test can be used) for the duration of  the COVID-19 declaration under Section 564(b)(1) of the Act, 21 U.S.C. section 360bbb-3(b)(1), unless the authorization is terminated or revoked sooner.     Influenza A by PCR NEGATIVE NEGATIVE Final   Influenza B by PCR NEGATIVE NEGATIVE Final    Comment: (NOTE) The Xpert Xpress SARS-CoV-2/FLU/RSV plus assay is intended as an aid in the diagnosis of influenza from Nasopharyngeal swab specimens and should not be used as a sole basis for treatment. Nasal washings and aspirates are unacceptable for Xpert Xpress SARS-CoV-2/FLU/RSV testing.  Fact Sheet for Patients: EntrepreneurPulse.com.au  Fact Sheet for Healthcare Providers: IncredibleEmployment.be  This test is not yet approved or cleared by the [place] FDA and has been authorized for detection and/or diagnosis of SARS-CoV-2 by FDA under an Emergency Use Authorization (EUA). This EUA will remain in effect (meaning this test can be used) for the duration of the COVID-19 declaration under Section 564(b)(1) of the Act, 21 U.S.C. section 360bbb-3(b)(1), unless the authorization is terminated or revoked.  Performed at [facility], [place] [address]  [place], [place] [address]   Culture, blood (routine x 2)     Status: None   Collection Time: 11/25/20 12:46 PM   Specimen: BLOOD LEFT WRIST  Result Value Ref Range Status   Specimen Description BLOOD LEFT WRIST  Final   Special Requests   Final    BOTTLES DRAWN AEROBIC AND ANAEROBIC Blood  Culture adequate volume   Culture   Final    NO GROWTH 5 DAYS Performed at [facility], [address] [place], [place] [address]    Report Status 11/30/2020 FINAL  Final  Culture, blood (routine x 2)     Status: None   Collection Time: 11/25/20 12:48 PM   Specimen: BLOOD  Result Value Ref Range Status   Specimen Description BLOOD LEFT ANTECUBITAL  Final   Special Requests   Final    BOTTLES DRAWN AEROBIC AND ANAEROBIC Blood Culture adequate volume   Culture   Final    NO GROWTH 5 DAYS Performed at [facility], [place] [address], [place], [place] [address]    Report Status 11/30/2020 FINAL  Final     Labs: Basic Metabolic Panel: Recent Labs  Lab 11/26/20 0243 11/27/20 0445 11/28/20 0418 11/29/20 0206 11/30/20 0446  NA 136 136 136 137 138  K 4.1 4.4 4.5 4.2 3.9  CL 104 103 101 104 103  CO2 $Re'25 26 30 30 29  'kii$ GLUCOSE 88 82 115* 96 92  BUN $Re'15 13 12 13 15  'tGD$ CREATININE 0.75 0.75 0.78 0.71 0.77  CALCIUM 7.9* 8.1* 8.1* 8.1* 7.9*   Liver Function Tests: Recent Labs  Lab 11/25/20 0308 11/26/20 0243 11/27/20 0445 11/28/20 0418 11/30/20 0446  AST 33 32 29 34 38  ALT $Re'25 19 12 11 16  'FGl$ ALKPHOS 62 53 57 53 49  BILITOT 0.6 0.6 0.5 0.5 0.5  PROT 4.8* 4.8* 5.1* 5.1* 5.0*  ALBUMIN 1.7* 1.8* 2.0* 1.9* 1.9*   No results for input(s): LIPASE, AMYLASE in the last 168 hours. No results for input(s): AMMONIA in the last 168 hours. CBC: Recent Labs  Lab 11/25/20 0308 11/26/20 0243 11/27/20 0445 11/28/20 0418 11/29/20 0206  WBC 18.8* 18.4* 19.9* 23.3* 19.6*  NEUTROABS 14.5* 13.7* 14.1* 19.9*  --   HGB 10.5* 10.3* 10.7* 10.3* 10.0*  HCT 32.2* 32.6* 33.1* 32.7* 30.4*  MCV 84.1 84.2 84.0 85.4 83.7  PLT 500* 473* 509* 510* 481*   Cardiac Enzymes: No results for input(s): CKTOTAL, CKMB, CKMBINDEX, TROPONINI in the last 168 hours. BNP: BNP (last 3 results) Recent Labs    07/24/20 0411 07/25/20 0501 07/26/20 0622  BNP 105.0* 123.0* 71.0    ProBNP (last 3  results) No results for input(s): PROBNP in the last 8760 hours.  CBG: Recent Labs  Lab 11/30/20 1128 11/30/20 1633 11/30/20 2229 12/01/20 0723 12/01/20 1121  GLUCAP 180* 171* 145* 82 113*       Signed:  Nita Sells MD   [facility] 12/01/2020, 1:31 PM

## 2020-12-01 NOTE — Progress Notes (Signed)
RD Sign Off Note  Pt is on hospital day 8.   Admitting Dx: Weakness [R53.1] Foot infection [L08.9] Osteomyelitis of ankle or foot, acute, left (HCC) [M86.172] Leukocytosis, unspecified type [D72.829] PMH:  Past Medical History:  Diagnosis Date   Blind right eye 1985   Following a work accident   BPH (benign prostatic hyperplasia)    Capsular cataract of left eye    COPD (chronic obstructive pulmonary disease) ([place])    Dr. [name]   Current smoker    Long-term smoker. Not interested in quitting   Diverticulosis    With intermittent diverticulitis   Erectile dysfunction    Essential hypertension    Several recorded blood pressures greater than 140/90.   H/O mitral valve prolapse    By report, but not confirmed by echo.   Low back pain    Chronic. Followed by Dr. Suella Broad   Mild aortic regurgitation 12/2013   Mild to moderate aortic regurgitation on echo   Neuropathy    Medications:  Scheduled Meds:  (feeding supplement) PROSource Plus  30 mL Oral BID BM   acetaminophen  650 mg Oral Q6H   vitamin C  500 mg Oral Daily   Chlorhexidine Gluconate Cloth  6 each Topical Daily   cholecalciferol  1,000 Units Oral Daily   cyclobenzaprine  10 mg Oral QHS   docusate sodium  100 mg Oral Daily   feeding supplement  237 mL Oral TID BM   folic acid  1 mg Oral Daily   furosemide  20 mg Oral Daily   gabapentin  300 mg Oral BID   losartan  25 mg Oral Daily   melatonin  10 mg Oral QHS   mometasone-formoterol  2 puff Inhalation BID   And   umeclidinium bromide  1 puff Inhalation Daily   multivitamin with minerals  1 tablet Oral Daily   nutrition supplement (JUVEN)  1 packet Oral BID BM   pantoprazole  40 mg Oral Daily   predniSONE  20 mg Oral Q breakfast   saccharomyces boulardii  250 mg Oral BID   thiamine  100 mg Oral Daily   zinc sulfate  220 mg Oral Daily   Continuous Infusions:  ampicillin-sulbactam (UNASYN) IV 3 g (12/01/20 1154)   [START ON 12/02/2020]  ceFAZolin (ANCEF)  IV      Labs: Recent Labs  Lab 11/28/20 0418 11/29/20 0206 11/30/20 0446  NA 136 137 138  K 4.5 4.2 3.9  CL 101 104 103  CO2 30 30 29   BUN 12 13 15   CREATININE 0.78 0.71 0.77  CALCIUM 8.1* 8.1* 7.9*  GLUCOSE 115* 96 92    Wt Readings from Last 15 Encounters:  12/01/20 107.4 kg  10/21/20 109.6 kg  09/25/20 110 kg  09/16/20 108.9 kg  09/10/20 105.2 kg  08/30/20 104.6 kg  08/13/20 112.9 kg  07/26/20 111.6 kg  06/18/20 110.2 kg  03/18/20 99.8 kg  01/08/20 99.8 kg  12/07/19 103.2 kg  10/18/19 104.3 kg  05/21/19 106.5 kg  02/13/19 103 kg    Body mass index is 33.97 kg/m. Patient meets criteria for obese based on current BMI.   Current diet order is carb modified, patient is consuming approximately 87.5% of meals at this time. Pt receiving Ensure TID and Prosource Plus BID and, per RN, has consistently consumed supplements well. No additional nutrition interventions warranted at this time. Given pt is pending discharge to SNF once bed is available (per MD) and pt has good po  intake with good supplement acceptance, RD will sign off at this time. If further nutrition issues arise, please re-consult RD.    Larkin Ina, MS, RD, LDN (she/her/hers) RD pager number and weekend/on-call pager number located in [place].

## 2020-12-01 NOTE — Progress Notes (Signed)
Physical Therapy Treatment Patient Details Name: Joel Perez MRN: [id number] DOB: September 12, 1945 Today's Date: 12/01/2020    History of Present Illness pt is a 75 y/o male admitted 7/7 with worsening weakness and R foot shown to have frank purulence.  R foot found to have osteomyelitis.  Pt has no furthe options for revascularization.  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  PMHx:  neuropathy, HTN, COPD, blindness R eye    PT Comments    Pt with multiple interruptions during therapy, frustrating pt. Pt reports that he has done well with therapy in the past but that he is having difficulty with moving after his BKA. CIR Admissions Coordinator in room and spoke with wife about pt ability to participate in therapy for 3 hours. Pt limited today by R LE pain, 4/4 DoE and fatigue in presence of decreased strength. Pt is currently modAx2-total A for bed mobility and maxAx2 for lateral scoot transfers which pt needed rest break after various techniques for hip movement. After rest break pt able to scoot approximately 8 inches before requesting to return to bed. Return to bed requires total A. Pt in agreement that CIR would be too much for him. PT recommending SNF level rehab prior to returning home. PT will continue to follow acutely.    Follow Up Recommendations  SNF     Equipment Recommendations  3in1 (PT);Other (comment);Wheelchair (measurements PT);Wheelchair cushion (measurements PT) (tub or shower seat)       Precautions / Restrictions Precautions Precautions: Fall Restrictions Weight Bearing Restrictions: Yes RLE Weight Bearing: Non weight bearing Other Position/Activity Restrictions: limb protector for mobility    Mobility  Bed Mobility Overal bed mobility: Needs Assistance Bed Mobility: Supine to Sit;Sit to Supine;Rolling Rolling: Mod assist;+2 for physical assistance   Supine to sit: Mod assist;+2 for physical assistance Sit to supine: Total assist   General bed mobility comments: modAx2 for managing  LE off bed and for for bringing trunk into upright, pt unable to scoot towards [facility] and ultimately foot board needed to be removed to safely return LE back to bed, once back in bed requires modA for rolling for pericare and clean pad placement and total A for pad scoot to HoB    Transfers Overall transfer level: Needs assistance Equipment used: None Transfers: Lateral/Scoot Transfers          Lateral/Scoot Transfers: Max assist;+2 physical assistance General transfer comment: despite multimodal cuing pt with increased anxiety with forward lean to offweight Hips to scoot towards HoB, despite multiple attempts with and without therapist assit, attempting to clear both hips and one hip at a time, pt requires 2x rest breaks for 4/4DoE, ultimately only able to scoot 8 inches towards EoB       Balance Overall balance assessment: Needs assistance Sitting-balance support: No upper extremity supported;Feet supported;Feet unsupported Sitting balance-Leahy Scale: Fair Sitting balance - Comments: falls posteriorly with more than minimal challenge                                    Cognition Arousal/Alertness: Awake/alert Behavior During Therapy: Flat affect Overall Cognitive Status: Within Functional Limits for tasks assessed                                 General Comments:  (wife present in room)      Exercises Amputee Exercises Knee Extension:  AROM;Both;5 reps;Seated    General Comments General comments (skin integrity, edema, etc.): Pt on 4L O2 via [place] experiencing 4/4 DoE cued in purse lip breathing, pt reports know all about purse lip breathing but even with cuing continues to breath in through his mouth.      Pertinent Vitals/Pain Pain Assessment: Faces Faces Pain Scale: Hurts even more Pain Location: R stump Pain Descriptors / Indicators: Sore;Tender Pain Intervention(s): Limited activity within patient's tolerance;Monitored during  session;Repositioned     PT Goals (current goals can now be found in the care plan section) Acute Rehab PT Goals Patient Stated Goal: pt agreed home as soon as possible, as able as possible and get prosthesis PT Goal Formulation: With patient Time For Goal Achievement: 12/12/20 Potential to Achieve Goals: Good Progress towards PT goals: Not progressing toward goals - comment (pain and fatigue)    Frequency    Min 3X/week      PT Plan Discharge plan needs to be updated       AM-PAC PT "6 Clicks" Mobility   Outcome Measure  Help needed turning from your back to your side while in a flat bed without using bedrails?: A Little Help needed moving from lying on your back to sitting on the side of a flat bed without using bedrails?: A Little Help needed moving to and from a bed to a chair (including a wheelchair)?: Total Help needed standing up from a chair using your arms (e.g., wheelchair or bedside chair)?: Total Help needed to walk in hospital room?: Total Help needed climbing 3-5 steps with a railing? : Total 6 Click Score: 10    End of Session Equipment Utilized During Treatment: Oxygen Activity Tolerance: Patient limited by fatigue Patient left: in bed;with call bell/phone within reach;with bed alarm set;with family/visitor present Nurse Communication: Mobility status PT Visit Diagnosis: Other abnormalities of gait and mobility (R26.89);Pain Pain - Right/Left: Right Pain - part of body: Leg     Time: 1140-1210 PT Time Calculation (min) (ACUTE ONLY): 30 min  Charges:  $Therapeutic Exercise: 8-22 mins $Therapeutic Activity: 8-22 mins                     Joel Perez B. Migdalia Dk PT, DPT Acute Rehabilitation Services Pager [phone] Office [phone]    [place] 12/01/2020, 4:52 PM

## 2020-12-01 NOTE — Progress Notes (Signed)
Inpatient Rehab Admissions Coordinator:   Following discussion with family and PT, it was decided that Pt. Likely cannot tolerate intensity of CIR. He would benefit from STR at [facility]. CIR will sign off.   Clemens Catholic, [place], [place] Admissions Coordinator  [phone] ([place]) [phone] (office)

## 2020-12-01 NOTE — Consult Note (Signed)
WOC Nurse Consult Note: Patient receiving care in [facility] 5M16. Reason for Consult: sacral wound Wound type: small, scattered partial thickness areas related to frequent stooling. Also scattered areas on upper posterior thighs from same. Pressure Injury POA: Yes/No/NA Measurement: Wound bed: pink Drainage (amount, consistency, odor) none Periwound: intact Dressing procedure/placement/frequency: Patient seen 6 days ago for same. Order in record for Desitin to sacrum without foam dressing. I have entered a daily order for the thighs as follows: Cleanse wounds on the upper, posterior thighs with soap and water, pat dry. Place foam dressings over the wounds.  Perform daily and prn dislodgement or soiling. [place] nurse will not follow at this time.  Please re-consult the [place] team if needed.  Val Riles, RN, MSN, CWOCN, CNS-BC, pager [phone]

## 2020-12-01 NOTE — Progress Notes (Signed)
OT Cancellation Note  Patient Details Name: Joel Perez MRN: [id number] DOB: February 16, 1946   Cancelled Treatment:    Reason Eval/Treat Not Completed: Patient declined, no reason specified  Pt declined therapy this date. Educated pt on demands for CIR (3hours therapy / day) and importance of participating with OT. Pt verbalized understanding and continued to decline therapy this date. Discussed benefits for rehab at SNF level to better suite pt's current activity tolerance and progression toward maximizing independence with self-care and functional mobility. Pt verbalized understanding. OT will return tomorrow for OT session.   [facility] OTR/L Acute Rehabilitation Services Office: [place] 12/01/2020, 2:37 PM

## 2020-12-01 NOTE — Progress Notes (Signed)
  Progress Note    12/01/2020 7:44 AM 4 Days Post-Op  Subjective:  eating breakfast.  Otherwise no complaints  Afebrile  Vitals:   11/30/20 2227 12/01/20 0621  BP: (!) 108/59 117/70  Pulse: (!) 102 (!) 106  Resp: 20 20  Temp: 97.8 F (36.6 C) 97.6 F (36.4 C)  SpO2: 98% 92%    Physical Exam: Not done due to pt eating breakfast.    CBC    Component Value Date/Time   WBC 19.6 (H) 11/29/2020 0206   RBC 3.63 (L) 11/29/2020 0206   HGB 10.0 (L) 11/29/2020 0206   HCT 30.4 (L) 11/29/2020 0206   PLT 481 (H) 11/29/2020 0206   MCV 83.7 11/29/2020 0206   MCH 27.5 11/29/2020 0206   MCHC 32.9 11/29/2020 0206   RDW 17.2 (H) 11/29/2020 0206   LYMPHSABS 1.7 11/28/2020 0418   MONOABS 0.7 11/28/2020 0418   EOSABS 0.0 11/28/2020 0418   BASOSABS 0.1 11/28/2020 0418    BMET    Component Value Date/Time   NA 138 11/30/2020 0446   NA 140 01/04/2019 1451   K 3.9 11/30/2020 0446   CL 103 11/30/2020 0446   CO2 29 11/30/2020 0446   GLUCOSE 92 11/30/2020 0446   BUN 15 11/30/2020 0446   BUN 12 01/04/2019 1451   CREATININE 0.77 11/30/2020 0446   CALCIUM 7.9 (L) 11/30/2020 0446   GFRNONAA >60 11/30/2020 0446   GFRAA 91 01/04/2019 1451    INR    Component Value Date/Time   INR 1.0 11/23/2020 1358     Intake/Output Summary (Last 24 hours) at 12/01/2020 0744 Last data filed at 12/01/2020 0354 Gross per 24 hour  Intake 1000 ml  Output 1475 ml  Net -475 ml     Assessment/Plan:  75 y.o. male is s/p right below knee amputation  4 Days Post-Op  -pt's wound vac and dressing can be removed today.  Per Dr. Stanford Breed, vac off and black sock back on stump.   -pt was eating breakfast and asked if it could be changed later.   -we will be by later today to change dressing.    Leontine Locket, PA-C [facility] [phone] 12/01/2020 7:44 AM

## 2020-12-01 NOTE — TOC Progression Note (Signed)
Transition of Care [facility]) - Progression Note    Patient Details  Name: SMARAN GAUS MRN: [id number] Date of Birth: 13-May-1946  [facility] of [facility]) CM/SW Contact  [name] Mila Homer, LCSW Phone Number: 12/01/2020, 3:33 PM  Clinical Narrative:  CSW talked with patient and his wife [name] at the bedside regarding his discharge disposition and recommendation of ST rehab. Mr. Gavin was sitting up in bed and was alert, oriented, pleasant and willing to talk with CSW. When asked, patient responded that he has never been to a skilled nursing facility for [place], however he worked at [employer] for 17 years and this is where he would like to go for rehab. This was discussed and patient/wife agreeable to CSW sending his information out to [facility] and [facility]. During the conversation, CSW informed that they each have an adult son (wife) and daughter (patient) from previous marriages.  Mr. Weinkauf reported that he has had the [place] vaccinations (06/15/19 & 07/06/19) and 1st booster (04/07/20) and is agreeable to getting the 2nd booster at the hospital before discharge. His wife had his vaccination card and it was copied and placed in his chart and MD advised.  Call made to [facility], admissions director at [facility] regarding patient and she will review his information and check on bed availability.      Barriers to Discharge: Continued Medical Work up  Expected Discharge Plan and Services     Discharge Planning Services: CM Consult   Living arrangements for the past 2 months: Single Family Home                                       Social Determinants of Health (SDOH) Interventions  No SDOH interventions requested or needed at discharge.  Readmission Risk Interventions No flowsheet data found.

## 2020-12-02 LAB — GLUCOSE, CAPILLARY
Glucose-Capillary: 91 mg/dL (ref 70–99)
Glucose-Capillary: 121 mg/dL — ABNORMAL HIGH (ref 70–99)
Glucose-Capillary: 180 mg/dL — ABNORMAL HIGH (ref 70–99)
Glucose-Capillary: 121 mg/dL — ABNORMAL HIGH (ref 70–99)

## 2020-12-02 LAB — SARS CORONAVIRUS 2 (TAT 6-24 HRS): SARS Coronavirus 2: NEGATIVE

## 2020-12-02 NOTE — Progress Notes (Signed)
Occupational Therapy Treatment Patient Details Name: Joel Perez MRN: [id number] DOB: Jul 25, 1945 Today's Date: 12/02/2020    History of present illness pt is a 75 y/o male admitted 7/7 with worsening weakness and R foot shown to have frank purulence.  R foot found to have osteomyelitis.  Pt has no furthe options for revascularization.  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  PMHx:  neuropathy, HTN, COPD, blindness R eye   OT comments  Pt received in bed, saturated in urine. Pt aware but did not alert staff. Pt required minA to rollin in bed and totalA for posterior and pericare. Noted wound area on pts right buttcheek. Notified RN. Educated pt on importance of positional changes and maintaining skin integrity. Pt educated on grieving process associate with an amputation and resources available to him. Pt limited to rolling this date due to bed malfunctioning. When bed was elevated, it would slowly start to lower the bed entirely and trendelenburg towards feet. RN aware. Pt will continue to benefit from skilled OT services to maximize safety and independence with ADL/IADL and functional mobility. Will continue to follow acutely and progress as tolerated.    Follow Up Recommendations  SNF    Equipment Recommendations  None recommended by OT    Recommendations for Other Services      Precautions / Restrictions Precautions Precautions: Fall Restrictions Weight Bearing Restrictions: Yes RLE Weight Bearing: Non weight bearing Other Position/Activity Restrictions: limb protector for mobility       Mobility Bed Mobility Overal bed mobility: Needs Assistance Bed Mobility: Rolling Rolling: Min assist         General bed mobility comments: minA for rolling to sidelying, pt utilizing bed rails, cues to bend LLE to assist with rolling to the right. Pt assisting with pulling self up in bed while bed in trendelenburg position    Transfers                      Balance       Sitting balance -  Comments: pt not functioning properly, defferred sitting EOB for safety                                   ADL either performed or assessed with clinical judgement   ADL Overall ADL's : Needs assistance/impaired Eating/Feeding: Independent                   Lower Body Dressing: Total assistance;Bed level     Toilet Transfer Details (indicate cue type and reason): pt unable to transfer, limited to rolling R<>L in bed, pt incontinent of bowels and urine this date Toileting- Clothing Manipulation and Hygiene: Total assistance Toileting - Clothing Manipulation Details (indicate cue type and reason): totalA for posterior and pericare     Functional mobility during ADLs:  (unable to stand) General ADL Comments: pt limited to rolling R<>L in bed, pt was saturated in urine and had BM, required assistance for cleaning. noted open area on pt's right butt cheek, notified RN     Vision       Perception     Praxis      Cognition Arousal/Alertness: Awake/alert Behavior During Therapy: Flat affect Overall Cognitive Status: Within Functional Limits for tasks assessed  General Comments: discussed the grieving process with pt and his wife. pt endorses feelings of depression. discussed available resources and support to address pt's mental well being (wife present in room)        Exercises Exercises: Other exercises;Amputee Other Exercises Other Exercises: educated pt on importance of frequent positional changes and notifying staff when he is soiled to unsure keeping skin integrity intact   Shoulder Instructions       General Comments VSS on 4lnc    Pertinent Vitals/ Pain       Pain Assessment: 0-10 Pain Score: 6  Pain Location: amputation site Pain Descriptors / Indicators: Sore;Tender Pain Intervention(s): Limited activity within patient's tolerance;Monitored during session  Home Living                                           Prior Functioning/Environment              Frequency  Min 2X/week        Progress Toward Goals  OT Goals(current goals can now be found in the care plan section)  Progress towards OT goals: Progressing toward goals  Acute Rehab OT Goals Patient Stated Goal: to be able to play the keyboard at church OT Goal Formulation: With patient Time For Goal Achievement: 12/12/20 Potential to Achieve Goals: Good ADL Goals Pt Will Perform Grooming: with modified independence;sitting Pt Will Perform Upper Body Bathing: with set-up;sitting Pt Will Perform Lower Body Bathing: with min assist;sitting/lateral leans Pt Will Perform Upper Body Dressing: with set-up;sitting Pt Will Perform Lower Body Dressing: with min assist;sitting/lateral leans Pt Will Transfer to Toilet: with min assist;squat pivot transfer Pt Will Perform Toileting - Clothing Manipulation and hygiene: with min assist;sitting/lateral leans  Plan Discharge plan needs to be updated    Co-evaluation                 AM-PAC OT "6 Clicks" Daily Activity     Outcome Measure   Help from another person eating meals?: A Little Help from another person taking care of personal grooming?: A Little Help from another person toileting, which includes using toliet, bedpan, or urinal?: Total Help from another person bathing (including washing, rinsing, drying)?: A Lot Help from another person to put on and taking off regular upper body clothing?: A Little Help from another person to put on and taking off regular lower body clothing?: Total 6 Click Score: 13    End of Session Equipment Utilized During Treatment: Oxygen  OT Visit Diagnosis: Unsteadiness on feet (R26.81);Other abnormalities of gait and mobility (R26.89);Muscle weakness (generalized) (M62.81)   Activity Tolerance Patient tolerated treatment well (treatment limited to bed malfunctioning)   Patient Left in bed;with call  bell/phone within reach;with nursing/sitter in room;with family/visitor present   Nurse Communication Mobility status        Time: 3428-7681 OT Time Calculation (min): 26 min  Charges: OT General Charges $OT Visit: 1 Visit OT Treatments $Self Care/Home Management : 23-37 mins  Helene Kelp OTR/L Acute Rehabilitation Services Office: [place] 12/02/2020, 9:53 AM

## 2020-12-02 NOTE — Care Management Important Message (Signed)
Important Message  Patient Details  Name: Joel Perez MRN: [id number] Date of Birth: Feb 05, 1946   Medicare Important Message Given:  Yes - Important Message mailed due to current National Emergency   Verbal consent obtained due to current National Emergency  Relationship to patient: Self Contact Name: Gilberto Call Date: 12/02/20  Time: 1337 Phone: [phone] Outcome: Spoke with contact Important Message mailed to: Patient address on file    [name] 12/02/2020, 1:38 PM

## 2020-12-02 NOTE — Discharge Summary (Signed)
Physician Discharge Summary  Joel Perez JIR:[id number] DOB: 10-22-1945 DOA: 11/23/2020  PCP: [name], MD  Admit date: 11/23/2020 Discharge date: 12/02/2020  Time spent: 37 minutes  Recommendations for Outpatient Follow-up:  Requires outpatient amputation management/postop care as per vascular surgeon Dr. Vanessa Barbara to remain in wound until 12/27/2020, weightbearing status deferred to surgeon's but probably nonweightbearing Continue Ancef as per OPAT orders in the outpatient setting for at least 6 weeks ending on 01/09/2019-[facility] physician Dr. Juleen China to follow-up in the outpatient setting regarding extension of this versus not Needs CBC Chem-12 1 week in outpatient setting Needs outpatient follow-up with Dr. [name] (pulmonology) regarding tapering of prednisone and follow-up right upper lobe 1.5 spiculated mass-I will CC him for this to be arranged Note dosage changes of various medications as per [facility] Will need outpatient discussion regarding prediabetes and sugars in addition to BMI 32 although suspect will be difficult with amputation for significant physical activity at this time Requires oxygen 4 L at all times given advanced COPD    Discharge Diagnoses:  MAIN problem for hospitalization   nonhealing right fifth toe resulting in R BKA 11/25/2020 MSSA bacteremia still present-extension of course of antibiotics till 8/18 PICC line placed 7/10 to be removed at the end of therapy or as per infectious disease physician Dr. Juleen China  Please see below for itemized issues addressed in [place]- refer to other progress notes for clarity if needed  Discharge Condition: Fair  Diet recommendation: Heart healthy low-salt-consider diabetic diet in the outpatient setting  Filed Weights   11/29/20 0542 12/01/20 0621 12/01/20 2134  Weight: 106.3 kg 107.4 kg 107.4 kg    History of present illness:  MSSA s/p R sided BKA 7/5 PM Blood culture X2 from 7/3 confirmed staph-surveillance cultures  7/5 neg Echocardiogram 7/6 no valvular vegetations--- TEE contemplated but deferred secondary to high oxygen requirement and risk of procedure therefore ID rec's 6 wk Abx ANCEF ending 01/08/21--PICC  placed 7/10-please remove PICC line once no longer required Oxy IR 5-10 every 4 --Gabapentin 300 twice daily for nerve pain, continue Flexeril 10 at bedtime in addition Stable for DC to skilled facility when bed available R LL infiltrate?  Postobstructive CXR 7/7 possible PNA-[unlikely--no sputum, no cough worse than prior]--completed 7/11-Ancef as above RUL 1.5 spiculated mass Severe COPD on 4 L of oxygen Outpatient follow-up Dr. Molli Posey will CC Dr. [name] on this discharge summary for care coordination regarding mass, COPD etc. Attempt in the outpatient setting to de-escalate steroids and will CC Dr. [name] to coordinate this Recent COVID-19 infection Treated--curently relatively asymptomatic HFpEF EF 60-65% Losartan resumed at 25 mg dose-May need to titrate in the outpatient Lasix 20 on hold Revatio 20 on hold till Op re-evl ?  DM TY 2 Obesity BMI 32 Not on any prior to admission meds-sugars trending below 180 Discontinue sliding scale   Procedures: BKA as above  Consultations: Vascular surgery  Discharge Exam: Vitals:   12/02/20 0910 12/02/20 0930  BP:  (!) 142/74  Pulse: (!) 103 100  Resp: 18 19  Temp:  98.2 F (36.8 C)  SpO2: 95% 96%    Subj on day of d/c   Well no issues On oxygen  General Exam on discharge  No distress  EOMI NCAT no focal deficit Moving limbs Chest is clear  No mass no swelling no rash Picture of wound from 7/10 included as below    Discharge Instructions   Discharge Instructions     [facility] pharmacist to adjust  dose for Vancomycin, Aminoglycosides and other anti-infective therapies as requested by physician.   Complete by: As directed    [facility] to provide Cath Flo 2mg    Complete by: As directed    Administer  for PICC line occlusion and as ordered by physician for other access device issues.   Anaphylaxis Kit: Provided to treat any anaphylactic reaction to the medication being provided to the patient if First Dose or when requested by physician   Complete by: As directed    Epinephrine 1mg /ml vial / amp: Administer 0.3mg  (0.1ml) subcutaneously once for moderate to severe anaphylaxis, nurse to call physician and pharmacy when reaction occurs and call 911 if needed for immediate care   Diphenhydramine 50mg /ml IV vial: Administer 25-50mg  IV/IM PRN for first dose reaction, rash, itching, mild reaction, nurse to call physician and pharmacy when reaction occurs   Sodium Chloride 0.9% NS 521ml IV: Administer if needed for hypovolemic blood pressure drop or as ordered by physician after call to physician with anaphylactic reaction   Change dressing on IV access line weekly and PRN   Complete by: As directed    Diet - low sodium heart healthy   Complete by: As directed    Discharge wound care:   Complete by: As directed    As above   Flush IV access with Sodium Chloride 0.9% and Heparin 10 units/ml or 100 units/ml   Complete by: As directed    Home infusion instructions - [facility]   Complete by: As directed    Instructions: Flush IV access with Sodium Chloride 0.9% and Heparin 10units/ml or 100units/ml   Change dressing on IV access line: Weekly and PRN   Instructions Cath Flo 2mg : Administer for PICC Line occlusion and as ordered by physician for other access device   [facility] pharmacist to adjust dose for: Vancomycin, Aminoglycosides and other anti-infective therapies as requested by physician   Increase activity slowly   Complete by: As directed    Method of administration may be changed at the discretion of home infusion pharmacist based upon assessment of the patient and/or caregiver's ability to self-administer the medication ordered   Complete by: As directed        Allergies as of 12/02/2020       Reactions   Other Swelling, Other (See Comments)   Farmed Fish (tightness in throat & lip swelling)   Codeine Rash   Sulfa Antibiotics Hives        Medication List     STOP taking these medications    loperamide 2 MG capsule Commonly known as: IMODIUM   oxyCODONE-acetaminophen 10-325 MG tablet Commonly known as: PERCOCET   saccharomyces boulardii 250 MG capsule Commonly known as: FLORASTOR   sildenafil 20 MG tablet Commonly known as: REVATIO       TAKE these medications    acetaminophen 325 MG tablet Commonly known as: TYLENOL Take 2 tablets (650 mg total) by mouth every 6 (six) hours.   albuterol (2.5 MG/3ML) 0.083% nebulizer solution Commonly known as: PROVENTIL USE 1 VIAL IN NEBULIZER EVERY 6 HOURS AS NEEDED FOR WHEEZING AND FOR SHORTNESS OF BREATH What changed: See the new instructions.   ProAir HFA 108 (90 Base) MCG/ACT inhaler Generic drug: albuterol INHALE 2 PUFFS BY MOUTH EVERY 6 HOURS AS NEEDED FOR WHEEZING FOR SHORTNESS OF BREATH What changed: See the new instructions.   aspirin EC 81 MG tablet Take 81 mg by mouth daily. Swallow whole.   atorvastatin 40  MG tablet Commonly known as: LIPITOR TAKE 1 TABLET BY MOUTH ONCE DAILY . APPOINTMENT REQUIRED FOR FUTURE REFILLS What changed: See the new instructions.   Breztri Aerosphere 160-9-4.8 MCG/ACT Aero Generic drug: Budeson-Glycopyrrol-Formoterol Take 2 puffs first thing in am and then another 2 puffs about 12 hours later. What changed:  how much to take how to take this when to take this additional instructions   ceFAZolin  IVPB Commonly known as: ANCEF Inject 2 g into the vein every 8 (eight) hours. Indication:  osteomyelitis and recurrent MSSA bacteremia First Dose: Yes Last Day of Therapy:  01/08/2021 Labs - Once weekly:  CBC/D and BMP, Labs - Every other week:  ESR and CRP Method of administration: IV Push Method of administration may be changed at the  discretion of home infusion pharmacist based upon assessment of the patient and/or caregiver's ability to self-administer the medication ordered.   cyclobenzaprine 10 MG tablet Commonly known as: FLEXERIL Take 10 mg by mouth at bedtime.   furosemide 20 MG tablet Commonly known as: LASIX TAKE 1 TABLET BY MOUTH ONCE DAILY What changed:  how much to take how to take this when to take this additional instructions   furosemide 20 MG tablet Commonly known as: LASIX Take 1 tablet (20 mg total) by mouth daily. What changed: Another medication with the same name was changed. Make sure you understand how and when to take each.   gabapentin 600 MG tablet Commonly known as: NEURONTIN Take 0.5 tablets (300 mg total) by mouth 2 (two) times daily. What changed:  how much to take when to take this   losartan 25 MG tablet Commonly known as: COZAAR Take 1 tablet (25 mg total) by mouth daily. What changed:  medication strength how much to take   Melatonin 10 MG Tabs Take 1 tablet by mouth at bedtime.   multivitamin with minerals Tabs tablet Take 1 tablet by mouth daily.   oxyCODONE 5 MG immediate release tablet Commonly known as: Oxy IR/ROXICODONE Take 1-2 tablets (5-10 mg total) by mouth every 4 (four) hours as needed for severe pain.   pantoprazole 40 MG tablet Commonly known as: PROTONIX Take 1 tablet (40 mg total) by mouth daily.   polyethylene glycol 17 g packet Commonly known as: MIRALAX / GLYCOLAX Take 17 g by mouth daily as needed for mild constipation.   predniSONE 10 MG tablet Commonly known as: DELTASONE 2 until better then one daily What changed: when to take this   sodium chloride 0.65 % nasal spray Commonly known as: OCEAN Place 1 spray into the nose daily as needed for congestion.   vitamin B-12 1000 MCG tablet Commonly known as: CYANOCOBALAMIN Take 1,000 mcg by mouth daily.   vitamin C 500 MG tablet Commonly known as: ASCORBIC ACID Take 500 mg by mouth  daily.               Discharge Care Instructions  (From admission, onward)           Start     Ordered   12/01/20 0000  Change dressing on IV access line weekly and PRN  (Home infusion instructions - [facility] )        12/01/20 1331   12/01/20 0000  Discharge wound care:       Comments: As above   12/01/20 1331           Allergies  Allergen Reactions   Other Swelling and Other (See Comments)    Amado Coe  Fish (tightness in throat & lip swelling)   Codeine Rash   Sulfa Antibiotics Hives    Follow-up Information     [facility] Follow up in 1 month(s).   Why: The office will call the patient with an appointment Contact information: [place], [place], [place] Follow up.   Specialties: Infectious Diseases, Internal Medicine Why: 12/26/20 at 9am. Please call to schedule if you are not able to make this appointment. Contact information: [place] [address] [phone]                  The results of significant diagnostics from this hospitalization (including imaging, microbiology, ancillary and laboratory) are listed below for reference.    Significant Diagnostic Studies: DG Chest 1 View  Result Date: 11/23/2020 CLINICAL DATA:  Concern for osteomyelitis of the RIGHT foot. Recent partial RIGHT foot amputation. Weakness. EXAM: CHEST  1 VIEW COMPARISON:  07/22/2020 FINDINGS: Stable cardiomegaly. Patchy opacity is identified in the RIGHT lung base, consistent with infectious infiltrate. There is no pulmonary edema. Within the RIGHT UPPER lobe, a 1.5 centimeter spiculated mass and adjacent satellite nodule are again noted. IMPRESSION: 1. RIGHT LOWER lobe infiltrates. 2. RIGHT UPPER lobe masses, as seen on prior exams. Electronically Signed   By: Nolon Nations M.D.   On: 11/23/2020 14:35   DG Chest 2 View  Result Date:  11/27/2020 CLINICAL DATA:  Pneumonia follow-up EXAM: CHEST - 2 VIEW COMPARISON:  11/23/2020 FINDINGS: Persistent opacity at the right lung base. Nodular opacities are identified in the right lung. Increased density at the left lung base. Small bilateral pleural effusions. Similar cardiomediastinal contours. No pneumothorax. IMPRESSION: Persistent opacities at the right lung base and right lung nodules. Increased left basilar atelectasis/consolidation. Small bilateral pleural effusions. Electronically Signed   By: Macy Mis M.D.   On: 11/27/2020 09:08   MR FOOT RIGHT WO CONTRAST  Result Date: 11/25/2020 CLINICAL DATA:  History of partial fifth ray amputation 08/28/2020. Pain, swelling and open wound with hernial and discharge for 2 days. EXAM: MRI OF THE RIGHT FOREFOOT WITHOUT CONTRAST TECHNIQUE: Multiplanar, multisequence MR imaging of the right foot was performed. No intravenous contrast was administered. COMPARISON:  Radiograph 11/23/2020 FINDINGS: Extensive and advanced changes of osteomyelitis involving the residual fifth metatarsal which is destroyed. Adjacent fluid collection measures 4.5 cm and is highly suspicious for an abscess. There is also an open wound in this area. There is severe diffuse cellulitis and myofasciitis. The other bony structures are intact. IMPRESSION: 1. Extensive and advanced changes of osteomyelitis involving the residual fifth metatarsal. 2. Adjacent 4.5 cm fluid collection highly suspicious for an abscess. 3. Severe diffuse cellulitis and myofasciitis. Electronically Signed   By: Marijo Sanes M.D.   On: 11/25/2020 07:55   DG Foot Complete Right  Result Date: 11/23/2020 CLINICAL DATA:  Recent partial amputation, concern for osteomyelitis. EXAM: RIGHT FOOT COMPLETE - 3+ VIEW COMPARISON:  08/25/2020 FINDINGS: Prior amputation of the small toe. Recognizable 3.1 cm proximal portion of the fifth metatarsal noted, lung distal margin of this there is a 3.8 cm region of amorphous  hazy calcification suspicious for a destroyed bony fragment from osteomyelitis, less likely to be heterotopic ossification along the prior amputation. No appreciable destructive findings along the remaining metatarsals or phalanges. Mildly exaggerated longitudinal arch of the foot on the lateral projection which is not obtained with the patient  standing. Extensive soft tissue swelling along the forefoot especially dorsally. Irregularity of the cutaneous surface along the amputation site and lateral to the fifth metatarsal. Small Achilles calcaneal spur. IMPRESSION: 1. 3.8 cm region of amorphous hazy calcification corresponding to the shaft of the fifth metatarsal, probably bony destruction from active osteomyelitis and less likely to from heterotopic ossification along the site of prior amputation. There is a 3.1 cm fragment of proximal fifth metatarsal which appears recognizable, proximal to this hazy calcification. Soft tissue swelling and irregularity of the overlying soft tissues. Electronically Signed   By: Van Clines M.D.   On: 11/23/2020 15:10   ECHOCARDIOGRAM COMPLETE  Result Date: 11/26/2020    ECHOCARDIOGRAM REPORT   Patient Name:   [name] Date of Exam: 11/26/2020 Medical Rec #:  [id number]     Height:       70.0 in Accession #:    [id number]    Weight:       227.7 lb Date of Birth:  [birth year]     BSA:          2.206 m Patient Age:    10 years      BP:           135/63 mmHg Patient Gender: M             HR:           92 bpm. Exam Location:  Inpatient Procedure: 2D Echo, Cardiac Doppler and Color Doppler Indications:    bacteremia  History:        Patient has prior history of Echocardiogram examinations, most                 recent 07/22/2020. Risk Factors:Former Smoker.  Sonographer:    Cammy Brochure Referring Phys: [id number] [place]  1. Left ventricular ejection fraction, by estimation, is 60 to 65%. The left ventricle has normal function. The left ventricle has no  regional wall motion abnormalities. There is mild left ventricular hypertrophy. Left ventricular diastolic parameters were normal.  2. Right ventricular systolic function is normal. The right ventricular size is normal.  3. The mitral valve is normal in structure. Trivial mitral valve regurgitation. No evidence of mitral stenosis.  4. The aortic valve is calcified. There is moderate calcification of the aortic valve. There is moderate thickening of the aortic valve. Aortic valve regurgitation is trivial. Mild aortic valve stenosis.  5. The inferior vena cava is normal in size with greater than 50% respiratory variability, suggesting right atrial pressure of 3 mmHg. Comparison(s): No significant change from prior study. Conclusion(s)/Recommendation(s): No evidence of valvular vegetations on this transthoracic echocardiogram. Would recommend a transesophageal echocardiogram to exclude infective endocarditis if clinically indicated. FINDINGS  Left Ventricle: Left ventricular ejection fraction, by estimation, is 60 to 65%. The left ventricle has normal function. The left ventricle has no regional wall motion abnormalities. The left ventricular internal cavity size was normal in size. There is  mild left ventricular hypertrophy. Left ventricular diastolic parameters were normal. Right Ventricle: The right ventricular size is normal. No increase in right ventricular wall thickness. Right ventricular systolic function is normal. Left Atrium: Left atrial size was normal in size. Right Atrium: Right atrial size was normal in size. Pericardium: There is no evidence of pericardial effusion. Mitral Valve: The mitral valve is normal in structure. Trivial mitral valve regurgitation. No evidence of mitral valve stenosis. Tricuspid Valve: The tricuspid valve is normal in structure. Tricuspid valve regurgitation is  trivial. No evidence of tricuspid stenosis. Aortic Valve: The aortic valve is calcified. There is moderate  calcification of the aortic valve. There is moderate thickening of the aortic valve. Aortic valve regurgitation is trivial. Mild aortic stenosis is present. Aortic valve mean gradient measures 5.7 mmHg. Aortic valve peak gradient measures 10.2 mmHg. Aortic valve area, by VTI measures 2.80 cm. Pulmonic Valve: The pulmonic valve was not well visualized. Pulmonic valve regurgitation is not visualized. Aorta: The aortic root and ascending aorta are structurally normal, with no evidence of dilitation. Venous: The inferior vena cava is normal in size with greater than 50% respiratory variability, suggesting right atrial pressure of 3 mmHg. IAS/Shunts: The atrial septum is grossly normal.  LEFT VENTRICLE PLAX 2D LVIDd:         4.50 cm LVIDs:         3.00 cm LV PW:         1.40 cm LV IVS:        0.90 cm LVOT diam:     2.10 cm LV SV:         75 LV SV Index:   34 LVOT Area:     3.46 cm  RIGHT VENTRICLE          IVC RV Basal diam:  3.50 cm  IVC diam: 0.70 cm LEFT ATRIUM             Index       RIGHT ATRIUM           Index LA diam:        3.00 cm 1.36 cm/m  RA Area:     11.50 cm LA Vol (A2C):   54.9 ml 24.89 ml/m RA Volume:   23.70 ml  10.74 ml/m LA Vol (A4C):   50.9 ml 23.08 ml/m LA Biplane Vol: 54.3 ml 24.62 ml/m  AORTIC VALVE AV Area (Vmax):    2.86 cm AV Area (Vmean):   2.82 cm AV Area (VTI):     2.80 cm AV Vmax:           159.33 cm/s AV Vmean:          110.000 cm/s AV VTI:            0.269 m AV Peak Grad:      10.2 mmHg AV Mean Grad:      5.7 mmHg LVOT Vmax:         131.67 cm/s LVOT Vmean:        89.567 cm/s LVOT VTI:          0.217 m LVOT/AV VTI ratio: 0.81  AORTA Ao Root diam: 3.30 cm Ao Asc diam:  2.90 cm  SHUNTS Systemic VTI:  0.22 m Systemic Diam: 2.10 cm Buford Dresser MD Electronically signed by Buford Dresser MD Signature Date/Time: 11/26/2020/2:45:08 PM    Final    [place] EKG Site Rite  Result Date: 11/28/2020 If Site Rite image not attached, placement could not be confirmed due to current  cardiac rhythm.   Microbiology: Recent Results (from the past 240 hour(s))  Blood culture (routine single)     Status: Abnormal   Collection Time: 11/23/20  1:58 PM   Specimen: BLOOD RIGHT HAND  Result Value Ref Range Status   Specimen Description BLOOD RIGHT HAND  Final   Special Requests   Final    BOTTLES DRAWN AEROBIC AND ANAEROBIC Blood Culture adequate volume   Culture  Setup Time   Final    GRAM POSITIVE COCCI  IN CLUSTERS AEROBIC BOTTLE ONLY CRITICAL RESULT CALLED TO, READ BACK BY AND VERIFIED WITH: Lanae Boast [facility] 2011 11/24/20 A BROWNING Performed at [facility], [place] [address] [place], [place] [address]    Culture STAPHYLOCOCCUS AUREUS (A)  Final   Report Status 11/26/2020 FINAL  Final   Organism ID, Bacteria STAPHYLOCOCCUS AUREUS  Final      Susceptibility   Staphylococcus aureus - MIC*    CIPROFLOXACIN <=0.5 SENSITIVE Sensitive     ERYTHROMYCIN >=8 RESISTANT Resistant     GENTAMICIN <=0.5 SENSITIVE Sensitive     OXACILLIN <=0.25 SENSITIVE Sensitive     TETRACYCLINE <=1 SENSITIVE Sensitive     VANCOMYCIN <=0.5 SENSITIVE Sensitive     TRIMETH/SULFA <=10 SENSITIVE Sensitive     CLINDAMYCIN >=8 RESISTANT Resistant     RIFAMPIN <=0.5 SENSITIVE Sensitive     Inducible Clindamycin NEGATIVE Sensitive     * STAPHYLOCOCCUS AUREUS  Blood Culture ID Panel (Reflexed)     Status: Abnormal   Collection Time: 11/23/20  1:58 PM  Result Value Ref Range Status   Enterococcus faecalis NOT DETECTED NOT DETECTED Final   Enterococcus Faecium NOT DETECTED NOT DETECTED Final   Listeria monocytogenes NOT DETECTED NOT DETECTED Final   Staphylococcus species DETECTED (A) NOT DETECTED Final    Comment: CRITICAL RESULT CALLED TO, READ BACK BY AND VERIFIED WITH: K HURTH PHARMD 2011 11/24/20 A BROWNING    Staphylococcus aureus (BCID) DETECTED (A) NOT DETECTED Final    Comment: CRITICAL RESULT CALLED TO, READ BACK BY AND VERIFIED WITH: Lanae Boast PHARMD 2011 11/24/20 A BROWNING     Staphylococcus epidermidis NOT DETECTED NOT DETECTED Final   Staphylococcus lugdunensis NOT DETECTED NOT DETECTED Final   Streptococcus species NOT DETECTED NOT DETECTED Final   Streptococcus agalactiae NOT DETECTED NOT DETECTED Final   Streptococcus pneumoniae NOT DETECTED NOT DETECTED Final   Streptococcus pyogenes NOT DETECTED NOT DETECTED Final   A.calcoaceticus-baumannii NOT DETECTED NOT DETECTED Final   Bacteroides fragilis NOT DETECTED NOT DETECTED Final   Enterobacterales NOT DETECTED NOT DETECTED Final   Enterobacter cloacae complex NOT DETECTED NOT DETECTED Final   Escherichia coli NOT DETECTED NOT DETECTED Final   Klebsiella aerogenes NOT DETECTED NOT DETECTED Final   Klebsiella oxytoca NOT DETECTED NOT DETECTED Final   Klebsiella pneumoniae NOT DETECTED NOT DETECTED Final   Proteus species NOT DETECTED NOT DETECTED Final   Salmonella species NOT DETECTED NOT DETECTED Final   Serratia marcescens NOT DETECTED NOT DETECTED Final   Haemophilus influenzae NOT DETECTED NOT DETECTED Final   Neisseria meningitidis NOT DETECTED NOT DETECTED Final   Pseudomonas aeruginosa NOT DETECTED NOT DETECTED Final   Stenotrophomonas maltophilia NOT DETECTED NOT DETECTED Final   Candida albicans NOT DETECTED NOT DETECTED Final   Candida auris NOT DETECTED NOT DETECTED Final   Candida glabrata NOT DETECTED NOT DETECTED Final   Candida krusei NOT DETECTED NOT DETECTED Final   Candida parapsilosis NOT DETECTED NOT DETECTED Final   Candida tropicalis NOT DETECTED NOT DETECTED Final   Cryptococcus neoformans/gattii NOT DETECTED NOT DETECTED Final   Meth resistant mecA/C and MREJ NOT DETECTED NOT DETECTED Final    Comment: Performed at [facility], [place]. [address], [place], [place] [address]  Culture, blood (routine x 2)     Status: None   Collection Time: 11/23/20  3:39 PM   Specimen: BLOOD  Result Value Ref Range Status   Specimen Description BLOOD SITE NOT SPECIFIED  Final   Special  Requests   Final    BOTTLES DRAWN AEROBIC AND ANAEROBIC Blood Culture results may not be optimal due to an excessive volume of blood received in culture bottles   Culture   Final    NO GROWTH 5 DAYS Performed at [facility], [place] [address], [place], [place] [address]    Report Status 11/28/2020 FINAL  Final  Culture, blood (routine x 2)     Status: None   Collection Time: 11/23/20  3:57 PM   Specimen: BLOOD  Result Value Ref Range Status   Specimen Description BLOOD SITE NOT SPECIFIED  Final   Special Requests   Final    BOTTLES DRAWN AEROBIC AND ANAEROBIC Blood Culture results may not be optimal due to an inadequate volume of blood received in culture bottles   Culture   Final    NO GROWTH 5 DAYS Performed at [facility], [place] [address], [place], [place] [address]    Report Status 11/28/2020 FINAL  Final  Resp Panel by RT-PCR (Flu A&B, Covid) Nasopharyngeal Swab     Status: Abnormal   Collection Time: 11/23/20  7:27 PM   Specimen: Nasopharyngeal Swab; Nasopharyngeal(NP) swabs in vial transport medium  Result Value Ref Range Status   SARS Coronavirus 2 by RT PCR POSITIVE (A) NEGATIVE Final    Comment: RESULT CALLED TO, READ BACK BY AND VERIFIED WITH: RN BOKIAGON C. BY MESSAN H. AT 2055 ON 11/23/2020 (NOTE) SARS-CoV-2 target nucleic acids are DETECTED.  The SARS-CoV-2 RNA is generally detectable in upper respiratory specimens during the acute phase of infection. Positive results are indicative of the presence of the identified virus, but do not rule out bacterial infection or co-infection with other pathogens not detected by the test. Clinical correlation with patient history and other diagnostic information is necessary to determine patient infection status. The expected result is Negative.  Fact Sheet for Patients: EntrepreneurPulse.com.au  Fact Sheet for Healthcare Providers: IncredibleEmployment.be  This test is not yet  approved or cleared by the [place] FDA and  has been authorized for detection and/or diagnosis of SARS-CoV-2 by FDA under an Emergency Use Authorization (EUA).  This EUA will remain in effect (meaning th is test can be used) for the duration of  the COVID-19 declaration under Section 564(b)(1) of the Act, 21 U.S.C. section 360bbb-3(b)(1), unless the authorization is terminated or revoked sooner.     Influenza A by PCR NEGATIVE NEGATIVE Final   Influenza B by PCR NEGATIVE NEGATIVE Final    Comment: (NOTE) The Xpert Xpress SARS-CoV-2/FLU/RSV plus assay is intended as an aid in the diagnosis of influenza from Nasopharyngeal swab specimens and should not be used as a sole basis for treatment. Nasal washings and aspirates are unacceptable for Xpert Xpress SARS-CoV-2/FLU/RSV testing.  Fact Sheet for Patients: EntrepreneurPulse.com.au  Fact Sheet for Healthcare Providers: IncredibleEmployment.be  This test is not yet approved or cleared by the [place] FDA and has been authorized for detection and/or diagnosis of SARS-CoV-2 by FDA under an Emergency Use Authorization (EUA). This EUA will remain in effect (meaning this test can be used) for the duration of the COVID-19 declaration under Section 564(b)(1) of the Act, 21 U.S.C. section 360bbb-3(b)(1), unless the authorization is terminated or revoked.  Performed at [facility], [place] [address], [place], [place] [address]   Culture, blood (routine x 2)     Status: None   Collection Time: 11/25/20 12:46 PM   Specimen: BLOOD LEFT WRIST  Result Value  Ref Range Status   Specimen Description BLOOD LEFT WRIST  Final   Special Requests   Final    BOTTLES DRAWN AEROBIC AND ANAEROBIC Blood Culture adequate volume   Culture   Final    NO GROWTH 5 DAYS Performed at [facility], [address] [place], [place] [address]    Report Status 11/30/2020 FINAL  Final  Culture, blood  (routine x 2)     Status: None   Collection Time: 11/25/20 12:48 PM   Specimen: BLOOD  Result Value Ref Range Status   Specimen Description BLOOD LEFT ANTECUBITAL  Final   Special Requests   Final    BOTTLES DRAWN AEROBIC AND ANAEROBIC Blood Culture adequate volume   Culture   Final    NO GROWTH 5 DAYS Performed at [facility], [place] [address] [place], [place] [address]    Report Status 11/30/2020 FINAL  Final  SARS CORONAVIRUS 2 (TAT 6-24 HRS) Nasopharyngeal Nasopharyngeal Swab     Status: None   Collection Time: 12/01/20  2:40 PM   Specimen: Nasopharyngeal Swab  Result Value Ref Range Status   SARS Coronavirus 2 NEGATIVE NEGATIVE Final    Comment: (NOTE) SARS-CoV-2 target nucleic acids are NOT DETECTED.  The SARS-CoV-2 RNA is generally detectable in upper and lower respiratory specimens during the acute phase of infection. Negative results do not preclude SARS-CoV-2 infection, do not rule out co-infections with other pathogens, and should not be used as the sole basis for treatment or other patient management decisions. Negative results must be combined with clinical observations, patient history, and epidemiological information. The expected result is Negative.  Fact Sheet for Patients: SugarRoll.be  Fact Sheet for Healthcare Providers: https://www.woods-mathews.com/  This test is not yet approved or cleared by the [place] FDA and  has been authorized for detection and/or diagnosis of SARS-CoV-2 by FDA under an Emergency Use Authorization (EUA). This EUA will remain  in effect (meaning this test can be used) for the duration of the COVID-19 declaration under Se ction 564(b)(1) of the Act, 21 U.S.C. section 360bbb-3(b)(1), unless the authorization is terminated or revoked sooner.  Performed at [facility], [place] [address] [place], [place] [address]      Labs: Basic Metabolic Panel: Recent Labs  Lab  11/26/20 0243 11/27/20 0445 11/28/20 0418 11/29/20 0206 11/30/20 0446  NA 136 136 136 137 138  K 4.1 4.4 4.5 4.2 3.9  CL 104 103 101 104 103  CO2 $Re'25 26 30 30 29  'uYe$ GLUCOSE 88 82 115* 96 92  BUN $Re'15 13 12 13 15  'svL$ CREATININE 0.75 0.75 0.78 0.71 0.77  CALCIUM 7.9* 8.1* 8.1* 8.1* 7.9*    Liver Function Tests: Recent Labs  Lab 11/26/20 0243 11/27/20 0445 11/28/20 0418 11/30/20 0446  AST 32 29 34 38  ALT $Re'19 12 11 16  'FcH$ ALKPHOS 53 57 53 49  BILITOT 0.6 0.5 0.5 0.5  PROT 4.8* 5.1* 5.1* 5.0*  ALBUMIN 1.8* 2.0* 1.9* 1.9*    No results for input(s): LIPASE, AMYLASE in the last 168 hours. No results for input(s): AMMONIA in the last 168 hours. CBC: Recent Labs  Lab 11/26/20 0243 11/27/20 0445 11/28/20 0418 11/29/20 0206  WBC 18.4* 19.9* 23.3* 19.6*  NEUTROABS 13.7* 14.1* 19.9*  --   HGB 10.3* 10.7* 10.3* 10.0*  HCT 32.6* 33.1* 32.7* 30.4*  MCV 84.2 84.0 85.4 83.7  PLT 473* 509* 510* 481*    Cardiac Enzymes: No results for input(s): CKTOTAL,  CKMB, CKMBINDEX, TROPONINI in the last 168 hours. BNP: BNP (last 3 results) Recent Labs    07/24/20 0411 07/25/20 0501 07/26/20 0622  BNP 105.0* 123.0* 71.0     ProBNP (last 3 results) No results for input(s): PROBNP in the last 8760 hours.  CBG: Recent Labs  Lab 12/01/20 1121 12/01/20 1719 12/01/20 2134 12/02/20 0639 12/02/20 1123  GLUCAP 113* 148* 103* 91 121*        Signed:  Nita Sells MD   [facility] 12/02/2020, 1:32 PM

## 2020-12-02 NOTE — Progress Notes (Signed)
Seen Stable for d/c See updated d/c summary  Verneita Griffes, MD [facility] 1:32 PM

## 2020-12-02 NOTE — Progress Notes (Signed)
[facility]  Subjective  - not much pain   Objective (!) 142/74 100 98.2 F (36.8 C) (Oral) 19 96%  Intake/Output Summary (Last 24 hours) at 12/02/2020 1524 Last data filed at 12/02/2020 1250 Gross per 24 hour  Intake 1069.76 ml  Output 1875 ml  Net -805.24 ml   Right BKA still with some edema superficial skin slough right lateral aspect near incision line  Assessment/Planning: Xeroform gauze 4 x 4 kerlix and compression sock right BKA Will arrange follow up with our office in one month Call if questions  Ruta Hinds 12/02/2020 3:24 PM --  Laboratory Lab Results: No results for input(s): WBC, HGB, HCT, PLT in the last 72 hours. BMET Recent Labs    11/30/20 0446  NA 138  K 3.9  CL 103  CO2 29  GLUCOSE 92  BUN 15  CREATININE 0.77  CALCIUM 7.9*    COAG Lab Results  Component Value Date   INR 1.0 11/23/2020   No results found for: PTT

## 2020-12-02 NOTE — TOC Progression Note (Signed)
Transition of Care [facility]) - Progression Note    Patient Details  Name: Joel Perez MRN: [id number] Date of Birth: 1946/04/28  [facility] CM/SW Contact  [name] Mila Homer, LCSW Phone Number: 12/02/2020, 6:28 PM  Clinical Narrative:   CSW visited with patient and wife twice today regarding his discharge to SNF. Interest was expressed in [place] during first visit and call made to [facility] and talked with admissions director and was advised that she will review patient's information after her meeting and respond in [place]. CSW checked later and Miquel Dunn declined due to cost of meds/treatment and patient/wife informed. Patient and wife then chose [facility] in [place]. Call made to admissions director Irven Shelling (4:07 pm) and message left. CSW will follow-up with Debbie on Wednesday.     Barriers to Discharge: Continued Medical Work up  Expected Discharge Plan and Services     Discharge Planning Services: CM Consult   Living arrangements for the past 2 months: Single Family Home Expected Discharge Date: 12/02/20                                   Social Determinants of Health (SDOH) Interventions  No SDOH interventions requested or needed at this time.  Readmission Risk Interventions No flowsheet data found.

## 2020-12-03 DIAGNOSIS — B9561 Methicillin susceptible Staphylococcus aureus infection as the cause of diseases classified elsewhere: Secondary | ICD-10-CM | POA: Diagnosis not present

## 2020-12-03 DIAGNOSIS — R7881 Bacteremia: Secondary | ICD-10-CM | POA: Diagnosis not present

## 2020-12-03 LAB — GLUCOSE, CAPILLARY
Glucose-Capillary: 73 mg/dL (ref 70–99)
Glucose-Capillary: 112 mg/dL — ABNORMAL HIGH (ref 70–99)
Glucose-Capillary: 165 mg/dL — ABNORMAL HIGH (ref 70–99)
Glucose-Capillary: 102 mg/dL — ABNORMAL HIGH (ref 70–99)

## 2020-12-03 MED ORDER — ONDANSETRON HCL 4 MG/2ML IJ SOLN: 4.0000 mg | Freq: Once | INTRAMUSCULAR | Status: DC

## 2020-12-03 NOTE — Discharge Summary (Signed)
Physician Discharge Summary  Joel Perez PTW:[id number] DOB: 01-25-1946 DOA: 11/23/2020  PCP: [name], MD  Admit date: 11/23/2020 Discharge date: 12/03/2020  Time spent: 37 minutes  Patient remains medically stable and agreeable for discharge to SNF - awaiting safe placement/disposition at this point.   Recommendations for Outpatient Follow-up:  Requires outpatient amputation management/postop care as per vascular surgeon Dr. Vanessa Barbara to remain in wound until 12/27/2020, weightbearing status deferred to surgeon's but probably nonweightbearing Continue Ancef as per OPAT orders in the outpatient setting for at least 6 weeks ending on 01/09/2019-[facility] physician Dr. Juleen China to follow-up in the outpatient setting regarding extension of this versus not Needs CBC Chem-12 1 week in outpatient setting Needs outpatient follow-up with Dr. [name] (pulmonology) regarding tapering of prednisone and follow-up right upper lobe 1.5 spiculated mass-I will CC him for this to be arranged Note dosage changes of various medications as per [facility] Will need outpatient discussion regarding prediabetes and sugars in addition to BMI 32 although suspect will be difficult with amputation for significant physical activity at this time Requires oxygen 4 L at all times given advanced COPD    Discharge Diagnoses:  Nonhealing right fifth toe resulting in R BKA 11/25/2020 MSSA bacteremia still present-extension of course of antibiotics till 8/18 - PICC line placed 7/10 to be removed at the end of therapy or as per infectious disease physician Dr. Juleen China  Please see below for itemized issues addressed in [facility]- refer to other progress notes for clarity if needed  Discharge Condition: Fair  Diet recommendation: Heart healthy low-salt-consider diabetic diet in the outpatient setting  Filed Weights   11/29/20 0542 12/01/20 0621 12/01/20 2134  Weight: 106.3 kg 107.4 kg 107.4 kg    History of present illness:    MSSA s/p R sided BKA 7/5 PM Blood culture X2 from 7/3 confirmed staph-surveillance cultures 7/5 neg Echocardiogram 7/6 no valvular vegetations--- TEE contemplated but deferred secondary to high oxygen requirement and risk of procedure therefore ID rec's 6 wk Abx ANCEF ending 01/08/21--PICC  placed 7/10-please remove PICC line once no longer required Oxy IR 5-10 every 4 --Gabapentin 300 twice daily for nerve pain, continue Flexeril 10 at bedtime in addition Stable for DC to skilled facility when bed available RLL infiltrate?  Postobstructive CXR 7/7 possible PNA-[unlikely--no sputum, no cough worse than prior]--completed 7/11-Ancef as above RUL 1.5 spiculated mass Severe COPD on 4 L of oxygen Outpatient follow-up Dr. Molli Posey will CC Dr. [name] on this discharge summary for care coordination regarding mass, COPD etc. Attempt in the outpatient setting to de-escalate steroids and will CC Dr. [name] to coordinate this Recent COVID-19 infection Status post treatment --curently asymptomatic, completed CDC guided quarantine HFpEF EF 60-65% Losartan resumed at 25 mg dose-May need to titrate in the outpatient Lasix 20 on hold Revatio 20 on hold till Op re-evl DM type 2, questionable history Lab Results  Component Value Date   HGBA1C 6.5 (H) 11/23/2020   Obesity BMI 32 Not on any prior to admission meds-sugars trending below 180 Discontinue sliding scale   Procedures: BKA as above  Consultations: Vascular surgery  Discharge Exam: Vitals:   12/02/20 2049 12/03/20 0424  BP: 134/63 127/73  Pulse: 88 89  Resp: 18 19  Temp: 97.6 F (36.4 C) 97.7 F (36.5 C)  SpO2: 99% 97%    Subj on day of d/c   Well no issues On oxygen  General Exam on discharge  No distress  EOMI NCAT no focal deficit Moving limbs Chest is  clear  No mass no swelling no rash Picture of wound from 7/10 included as below; bandage clean/dry/intact today    Discharge Instructions   Discharge Instructions      [facility] pharmacist to adjust dose for Vancomycin, Aminoglycosides and other anti-infective therapies as requested by physician.   Complete by: As directed    [facility] to provide Cath Flo 2mg    Complete by: As directed    Administer for PICC line occlusion and as ordered by physician for other access device issues.   Anaphylaxis Kit: Provided to treat any anaphylactic reaction to the medication being provided to the patient if First Dose or when requested by physician   Complete by: As directed    Epinephrine 1mg /ml vial / amp: Administer 0.3mg  (0.40ml) subcutaneously once for moderate to severe anaphylaxis, nurse to call physician and pharmacy when reaction occurs and call 911 if needed for immediate care   Diphenhydramine 50mg /ml IV vial: Administer 25-50mg  IV/IM PRN for first dose reaction, rash, itching, mild reaction, nurse to call physician and pharmacy when reaction occurs   Sodium Chloride 0.9% NS 581ml IV: Administer if needed for hypovolemic blood pressure drop or as ordered by physician after call to physician with anaphylactic reaction   Change dressing on IV access line weekly and PRN   Complete by: As directed    Diet - low sodium heart healthy   Complete by: As directed    Discharge wound care:   Complete by: As directed    As above   Flush IV access with Sodium Chloride 0.9% and Heparin 10 units/ml or 100 units/ml   Complete by: As directed    Home infusion instructions - [facility]   Complete by: As directed    Instructions: Flush IV access with Sodium Chloride 0.9% and Heparin 10units/ml or 100units/ml   Change dressing on IV access line: Weekly and PRN   Instructions Cath Flo 2mg : Administer for PICC Line occlusion and as ordered by physician for other access device   [facility] pharmacist to adjust dose for: Vancomycin, Aminoglycosides and other anti-infective therapies as requested by physician   Increase activity  slowly   Complete by: As directed    Method of administration may be changed at the discretion of home infusion pharmacist based upon assessment of the patient and/or caregiver's ability to self-administer the medication ordered   Complete by: As directed       Allergies as of 12/03/2020       Reactions   Other Swelling, Other (See Comments)   Farmed Fish (tightness in throat & lip swelling)   Codeine Rash   Sulfa Antibiotics Hives        Medication List     STOP taking these medications    loperamide 2 MG capsule Commonly known as: IMODIUM   oxyCODONE-acetaminophen 10-325 MG tablet Commonly known as: PERCOCET   saccharomyces boulardii 250 MG capsule Commonly known as: FLORASTOR   sildenafil 20 MG tablet Commonly known as: REVATIO       TAKE these medications    acetaminophen 325 MG tablet Commonly known as: TYLENOL Take 2 tablets (650 mg total) by mouth every 6 (six) hours.   albuterol (2.5 MG/3ML) 0.083% nebulizer solution Commonly known as: PROVENTIL USE 1 VIAL IN NEBULIZER EVERY 6 HOURS AS NEEDED FOR WHEEZING AND FOR SHORTNESS OF BREATH What changed: See the new instructions.   ProAir HFA 108 (90 Base) MCG/ACT inhaler Generic drug: albuterol INHALE 2 PUFFS  BY MOUTH EVERY 6 HOURS AS NEEDED FOR WHEEZING FOR SHORTNESS OF BREATH What changed: See the new instructions.   aspirin EC 81 MG tablet Take 81 mg by mouth daily. Swallow whole.   atorvastatin 40 MG tablet Commonly known as: LIPITOR TAKE 1 TABLET BY MOUTH ONCE DAILY . APPOINTMENT REQUIRED FOR FUTURE REFILLS What changed: See the new instructions.   Breztri Aerosphere 160-9-4.8 MCG/ACT Aero Generic drug: Budeson-Glycopyrrol-Formoterol Take 2 puffs first thing in am and then another 2 puffs about 12 hours later. What changed:  how much to take how to take this when to take this additional instructions   ceFAZolin  IVPB Commonly known as: ANCEF Inject 2 g into the vein every 8 (eight) hours.  Indication:  osteomyelitis and recurrent MSSA bacteremia First Dose: Yes Last Day of Therapy:  01/08/2021 Labs - Once weekly:  CBC/D and BMP, Labs - Every other week:  ESR and CRP Method of administration: IV Push Method of administration may be changed at the discretion of home infusion pharmacist based upon assessment of the patient and/or caregiver's ability to self-administer the medication ordered.   cyclobenzaprine 10 MG tablet Commonly known as: FLEXERIL Take 10 mg by mouth at bedtime.   furosemide 20 MG tablet Commonly known as: LASIX TAKE 1 TABLET BY MOUTH ONCE DAILY What changed:  how much to take how to take this when to take this additional instructions   furosemide 20 MG tablet Commonly known as: LASIX Take 1 tablet (20 mg total) by mouth daily. What changed: Another medication with the same name was changed. Make sure you understand how and when to take each.   gabapentin 600 MG tablet Commonly known as: NEURONTIN Take 0.5 tablets (300 mg total) by mouth 2 (two) times daily. What changed:  how much to take when to take this   losartan 25 MG tablet Commonly known as: COZAAR Take 1 tablet (25 mg total) by mouth daily. What changed:  medication strength how much to take   Melatonin 10 MG Tabs Take 1 tablet by mouth at bedtime.   multivitamin with minerals Tabs tablet Take 1 tablet by mouth daily.   oxyCODONE 5 MG immediate release tablet Commonly known as: Oxy IR/ROXICODONE Take 1-2 tablets (5-10 mg total) by mouth every 4 (four) hours as needed for severe pain.   pantoprazole 40 MG tablet Commonly known as: PROTONIX Take 1 tablet (40 mg total) by mouth daily.   polyethylene glycol 17 g packet Commonly known as: MIRALAX / GLYCOLAX Take 17 g by mouth daily as needed for mild constipation.   predniSONE 10 MG tablet Commonly known as: DELTASONE 2 until better then one daily What changed: when to take this   sodium chloride 0.65 % nasal  spray Commonly known as: OCEAN Place 1 spray into the nose daily as needed for congestion.   vitamin B-12 1000 MCG tablet Commonly known as: CYANOCOBALAMIN Take 1,000 mcg by mouth daily.   vitamin C 500 MG tablet Commonly known as: ASCORBIC ACID Take 500 mg by mouth daily.               Discharge Care Instructions  (From admission, onward)           Start     Ordered   12/01/20 0000  Change dressing on IV access line weekly and PRN  (Home infusion instructions - [facility] )        12/01/20 1331   12/01/20 0000  Discharge wound care:  Comments: As above   12/01/20 1331           Allergies  Allergen Reactions   Other Swelling and Other (See Comments)    Farmed Fish (tightness in throat & lip swelling)   Codeine Rash   Sulfa Antibiotics Hives    Follow-up Information     [facility] Follow up in 1 month(s).   Why: The office will call the patient with an appointment Contact information: [place], [place], [place] Follow up.   Specialties: Infectious Diseases, Internal Medicine Why: 12/26/20 at 9am. Please call to schedule if you are not able to make this appointment. Contact information: [place] [address] [phone]                  The results of significant diagnostics from this hospitalization (including imaging, microbiology, ancillary and laboratory) are listed below for reference.    Significant Diagnostic Studies: DG Chest 1 View  Result Date: 11/23/2020 CLINICAL DATA:  Concern for osteomyelitis of the RIGHT foot. Recent partial RIGHT foot amputation. Weakness. EXAM: CHEST  1 VIEW COMPARISON:  07/22/2020 FINDINGS: Stable cardiomegaly. Patchy opacity is identified in the RIGHT lung base, consistent with infectious infiltrate. There is no pulmonary edema. Within the RIGHT UPPER lobe, a 1.5 centimeter  spiculated mass and adjacent satellite nodule are again noted. IMPRESSION: 1. RIGHT LOWER lobe infiltrates. 2. RIGHT UPPER lobe masses, as seen on prior exams. Electronically Signed   By: Nolon Nations M.D.   On: 11/23/2020 14:35   DG Chest 2 View  Result Date: 11/27/2020 CLINICAL DATA:  Pneumonia follow-up EXAM: CHEST - 2 VIEW COMPARISON:  11/23/2020 FINDINGS: Persistent opacity at the right lung base. Nodular opacities are identified in the right lung. Increased density at the left lung base. Small bilateral pleural effusions. Similar cardiomediastinal contours. No pneumothorax. IMPRESSION: Persistent opacities at the right lung base and right lung nodules. Increased left basilar atelectasis/consolidation. Small bilateral pleural effusions. Electronically Signed   By: Macy Mis M.D.   On: 11/27/2020 09:08   MR FOOT RIGHT WO CONTRAST  Result Date: 11/25/2020 CLINICAL DATA:  History of partial fifth ray amputation 08/28/2020. Pain, swelling and open wound with hernial and discharge for 2 days. EXAM: MRI OF THE RIGHT FOREFOOT WITHOUT CONTRAST TECHNIQUE: Multiplanar, multisequence MR imaging of the right foot was performed. No intravenous contrast was administered. COMPARISON:  Radiograph 11/23/2020 FINDINGS: Extensive and advanced changes of osteomyelitis involving the residual fifth metatarsal which is destroyed. Adjacent fluid collection measures 4.5 cm and is highly suspicious for an abscess. There is also an open wound in this area. There is severe diffuse cellulitis and myofasciitis. The other bony structures are intact. IMPRESSION: 1. Extensive and advanced changes of osteomyelitis involving the residual fifth metatarsal. 2. Adjacent 4.5 cm fluid collection highly suspicious for an abscess. 3. Severe diffuse cellulitis and myofasciitis. Electronically Signed   By: Marijo Sanes M.D.   On: 11/25/2020 07:55   DG Foot Complete Right  Result Date: 11/23/2020 CLINICAL DATA:  Recent partial  amputation, concern for osteomyelitis. EXAM: RIGHT FOOT COMPLETE - 3+ VIEW COMPARISON:  08/25/2020 FINDINGS: Prior amputation of the small toe. Recognizable 3.1 cm proximal portion of the fifth metatarsal noted, lung distal margin of this there is a 3.8 cm region of amorphous hazy calcification suspicious for a destroyed bony fragment from osteomyelitis, less likely to be heterotopic  ossification along the prior amputation. No appreciable destructive findings along the remaining metatarsals or phalanges. Mildly exaggerated longitudinal arch of the foot on the lateral projection which is not obtained with the patient standing. Extensive soft tissue swelling along the forefoot especially dorsally. Irregularity of the cutaneous surface along the amputation site and lateral to the fifth metatarsal. Small Achilles calcaneal spur. IMPRESSION: 1. 3.8 cm region of amorphous hazy calcification corresponding to the shaft of the fifth metatarsal, probably bony destruction from active osteomyelitis and less likely to from heterotopic ossification along the site of prior amputation. There is a 3.1 cm fragment of proximal fifth metatarsal which appears recognizable, proximal to this hazy calcification. Soft tissue swelling and irregularity of the overlying soft tissues. Electronically Signed   By: Van Clines M.D.   On: 11/23/2020 15:10   ECHOCARDIOGRAM COMPLETE  Result Date: 11/26/2020    ECHOCARDIOGRAM REPORT   Patient Name:   [name] Date of Exam: 11/26/2020 Medical Rec #:  [id number]     Height:       70.0 in Accession #:    [id number]    Weight:       227.7 lb Date of Birth:  1945-09-08     BSA:          2.206 m Patient Age:    54 years      BP:           135/63 mmHg Patient Gender: M             HR:           92 bpm. Exam Location:  Inpatient Procedure: 2D Echo, Cardiac Doppler and Color Doppler Indications:    bacteremia  History:        Patient has prior history of Echocardiogram examinations, most                  recent 07/22/2020. Risk Factors:Former Smoker.  Sonographer:    Cammy Brochure Referring Phys: [id number] [place]  1. Left ventricular ejection fraction, by estimation, is 60 to 65%. The left ventricle has normal function. The left ventricle has no regional wall motion abnormalities. There is mild left ventricular hypertrophy. Left ventricular diastolic parameters were normal.  2. Right ventricular systolic function is normal. The right ventricular size is normal.  3. The mitral valve is normal in structure. Trivial mitral valve regurgitation. No evidence of mitral stenosis.  4. The aortic valve is calcified. There is moderate calcification of the aortic valve. There is moderate thickening of the aortic valve. Aortic valve regurgitation is trivial. Mild aortic valve stenosis.  5. The inferior vena cava is normal in size with greater than 50% respiratory variability, suggesting right atrial pressure of 3 mmHg. Comparison(s): No significant change from prior study. Conclusion(s)/Recommendation(s): No evidence of valvular vegetations on this transthoracic echocardiogram. Would recommend a transesophageal echocardiogram to exclude infective endocarditis if clinically indicated. FINDINGS  Left Ventricle: Left ventricular ejection fraction, by estimation, is 60 to 65%. The left ventricle has normal function. The left ventricle has no regional wall motion abnormalities. The left ventricular internal cavity size was normal in size. There is  mild left ventricular hypertrophy. Left ventricular diastolic parameters were normal. Right Ventricle: The right ventricular size is normal. No increase in right ventricular wall thickness. Right ventricular systolic function is normal. Left Atrium: Left atrial size was normal in size. Right Atrium: Right atrial size was normal in size. Pericardium: There is no evidence of pericardial  effusion. Mitral Valve: The mitral valve is normal in structure. Trivial mitral  valve regurgitation. No evidence of mitral valve stenosis. Tricuspid Valve: The tricuspid valve is normal in structure. Tricuspid valve regurgitation is trivial. No evidence of tricuspid stenosis. Aortic Valve: The aortic valve is calcified. There is moderate calcification of the aortic valve. There is moderate thickening of the aortic valve. Aortic valve regurgitation is trivial. Mild aortic stenosis is present. Aortic valve mean gradient measures 5.7 mmHg. Aortic valve peak gradient measures 10.2 mmHg. Aortic valve area, by VTI measures 2.80 cm. Pulmonic Valve: The pulmonic valve was not well visualized. Pulmonic valve regurgitation is not visualized. Aorta: The aortic root and ascending aorta are structurally normal, with no evidence of dilitation. Venous: The inferior vena cava is normal in size with greater than 50% respiratory variability, suggesting right atrial pressure of 3 mmHg. IAS/Shunts: The atrial septum is grossly normal.  LEFT VENTRICLE PLAX 2D LVIDd:         4.50 cm LVIDs:         3.00 cm LV PW:         1.40 cm LV IVS:        0.90 cm LVOT diam:     2.10 cm LV SV:         75 LV SV Index:   34 LVOT Area:     3.46 cm  RIGHT VENTRICLE          IVC RV Basal diam:  3.50 cm  IVC diam: 0.70 cm LEFT ATRIUM             Index       RIGHT ATRIUM           Index LA diam:        3.00 cm 1.36 cm/m  RA Area:     11.50 cm LA Vol (A2C):   54.9 ml 24.89 ml/m RA Volume:   23.70 ml  10.74 ml/m LA Vol (A4C):   50.9 ml 23.08 ml/m LA Biplane Vol: 54.3 ml 24.62 ml/m  AORTIC VALVE AV Area (Vmax):    2.86 cm AV Area (Vmean):   2.82 cm AV Area (VTI):     2.80 cm AV Vmax:           159.33 cm/s AV Vmean:          110.000 cm/s AV VTI:            0.269 m AV Peak Grad:      10.2 mmHg AV Mean Grad:      5.7 mmHg LVOT Vmax:         131.67 cm/s LVOT Vmean:        89.567 cm/s LVOT VTI:          0.217 m LVOT/AV VTI ratio: 0.81  AORTA Ao Root diam: 3.30 cm Ao Asc diam:  2.90 cm  SHUNTS Systemic VTI:  0.22 m Systemic Diam: 2.10  cm Buford Dresser MD Electronically signed by Buford Dresser MD Signature Date/Time: 11/26/2020/2:45:08 PM    Final    [place] EKG Site Rite  Result Date: 11/28/2020 If Site Rite image not attached, placement could not be confirmed due to current cardiac rhythm.   Microbiology: Recent Results (from the past 240 hour(s))  Blood culture (routine single)     Status: Abnormal   Collection Time: 11/23/20  1:58 PM   Specimen: BLOOD RIGHT HAND  Result Value Ref Range Status   Specimen Description BLOOD RIGHT HAND  Final  Special Requests   Final    BOTTLES DRAWN AEROBIC AND ANAEROBIC Blood Culture adequate volume   Culture  Setup Time   Final    GRAM POSITIVE COCCI IN CLUSTERS AEROBIC BOTTLE ONLY CRITICAL RESULT CALLED TO, READ BACK BY AND VERIFIED WITH: Lanae Boast [facility] 2011 11/24/20 A BROWNING Performed at [facility], [place] [address], [place], [place] [address]    Culture STAPHYLOCOCCUS AUREUS (A)  Final   Report Status 11/26/2020 FINAL  Final   Organism ID, Bacteria STAPHYLOCOCCUS AUREUS  Final      Susceptibility   Staphylococcus aureus - MIC*    CIPROFLOXACIN <=0.5 SENSITIVE Sensitive     ERYTHROMYCIN >=8 RESISTANT Resistant     GENTAMICIN <=0.5 SENSITIVE Sensitive     OXACILLIN <=0.25 SENSITIVE Sensitive     TETRACYCLINE <=1 SENSITIVE Sensitive     VANCOMYCIN <=0.5 SENSITIVE Sensitive     TRIMETH/SULFA <=10 SENSITIVE Sensitive     CLINDAMYCIN >=8 RESISTANT Resistant     RIFAMPIN <=0.5 SENSITIVE Sensitive     Inducible Clindamycin NEGATIVE Sensitive     * STAPHYLOCOCCUS AUREUS  Blood Culture ID Panel (Reflexed)     Status: Abnormal   Collection Time: 11/23/20  1:58 PM  Result Value Ref Range Status   Enterococcus faecalis NOT DETECTED NOT DETECTED Final   Enterococcus Faecium NOT DETECTED NOT DETECTED Final   Listeria monocytogenes NOT DETECTED NOT DETECTED Final   Staphylococcus species DETECTED (A) NOT DETECTED Final    Comment: CRITICAL RESULT CALLED TO, READ  BACK BY AND VERIFIED WITH: K HURTH PHARMD 2011 11/24/20 A BROWNING    Staphylococcus aureus (BCID) DETECTED (A) NOT DETECTED Final    Comment: CRITICAL RESULT CALLED TO, READ BACK BY AND VERIFIED WITH: Lanae Boast PHARMD 2011 11/24/20 A BROWNING    Staphylococcus epidermidis NOT DETECTED NOT DETECTED Final   Staphylococcus lugdunensis NOT DETECTED NOT DETECTED Final   Streptococcus species NOT DETECTED NOT DETECTED Final   Streptococcus agalactiae NOT DETECTED NOT DETECTED Final   Streptococcus pneumoniae NOT DETECTED NOT DETECTED Final   Streptococcus pyogenes NOT DETECTED NOT DETECTED Final   A.calcoaceticus-baumannii NOT DETECTED NOT DETECTED Final   Bacteroides fragilis NOT DETECTED NOT DETECTED Final   Enterobacterales NOT DETECTED NOT DETECTED Final   Enterobacter cloacae complex NOT DETECTED NOT DETECTED Final   Escherichia coli NOT DETECTED NOT DETECTED Final   Klebsiella aerogenes NOT DETECTED NOT DETECTED Final   Klebsiella oxytoca NOT DETECTED NOT DETECTED Final   Klebsiella pneumoniae NOT DETECTED NOT DETECTED Final   Proteus species NOT DETECTED NOT DETECTED Final   Salmonella species NOT DETECTED NOT DETECTED Final   Serratia marcescens NOT DETECTED NOT DETECTED Final   Haemophilus influenzae NOT DETECTED NOT DETECTED Final   Neisseria meningitidis NOT DETECTED NOT DETECTED Final   Pseudomonas aeruginosa NOT DETECTED NOT DETECTED Final   Stenotrophomonas maltophilia NOT DETECTED NOT DETECTED Final   Candida albicans NOT DETECTED NOT DETECTED Final   Candida auris NOT DETECTED NOT DETECTED Final   Candida glabrata NOT DETECTED NOT DETECTED Final   Candida krusei NOT DETECTED NOT DETECTED Final   Candida parapsilosis NOT DETECTED NOT DETECTED Final   Candida tropicalis NOT DETECTED NOT DETECTED Final   Cryptococcus neoformans/gattii NOT DETECTED NOT DETECTED Final   Meth resistant mecA/C and MREJ NOT DETECTED NOT DETECTED Final    Comment: Performed at [facility],  [place]. [address] [place],  [address]  Culture, blood (routine x 2)     Status:  None   Collection Time: 11/23/20  3:39 PM   Specimen: BLOOD  Result Value Ref Range Status   Specimen Description BLOOD SITE NOT SPECIFIED  Final   Special Requests   Final    BOTTLES DRAWN AEROBIC AND ANAEROBIC Blood Culture results may not be optimal due to an excessive volume of blood received in culture bottles   Culture   Final    NO GROWTH 5 DAYS Performed at [facility], [place] [address], [place], [place] [address]    Report Status 11/28/2020 FINAL  Final  Culture, blood (routine x 2)     Status: None   Collection Time: 11/23/20  3:57 PM   Specimen: BLOOD  Result Value Ref Range Status   Specimen Description BLOOD SITE NOT SPECIFIED  Final   Special Requests   Final    BOTTLES DRAWN AEROBIC AND ANAEROBIC Blood Culture results may not be optimal due to an inadequate volume of blood received in culture bottles   Culture   Final    NO GROWTH 5 DAYS Performed at [facility], [place] [address] [place], [place] [address]    Report Status 11/28/2020 FINAL  Final  Resp Panel by RT-PCR (Flu A&B, Covid) Nasopharyngeal Swab     Status: Abnormal   Collection Time: 11/23/20  7:27 PM   Specimen: Nasopharyngeal Swab; Nasopharyngeal(NP) swabs in vial transport medium  Result Value Ref Range Status   SARS Coronavirus 2 by RT PCR POSITIVE (A) NEGATIVE Final    Comment: RESULT CALLED TO, READ BACK BY AND VERIFIED WITH: RN BOKIAGON C. BY MESSAN H. AT 2055 ON 11/23/2020 (NOTE) SARS-CoV-2 target nucleic acids are DETECTED.  The SARS-CoV-2 RNA is generally detectable in upper respiratory specimens during the acute phase of infection. Positive results are indicative of the presence of the identified virus, but do not rule out bacterial infection or co-infection with other pathogens not detected by the test. Clinical correlation with patient history and other diagnostic information is necessary to determine  patient infection status. The expected result is Negative.  Fact Sheet for Patients: EntrepreneurPulse.com.au  Fact Sheet for Healthcare Providers: IncredibleEmployment.be  This test is not yet approved or cleared by the [place] FDA and  has been authorized for detection and/or diagnosis of SARS-CoV-2 by FDA under an Emergency Use Authorization (EUA).  This EUA will remain in effect (meaning th is test can be used) for the duration of  the COVID-19 declaration under Section 564(b)(1) of the Act, 21 U.S.C. section 360bbb-3(b)(1), unless the authorization is terminated or revoked sooner.     Influenza A by PCR NEGATIVE NEGATIVE Final   Influenza B by PCR NEGATIVE NEGATIVE Final    Comment: (NOTE) The Xpert Xpress SARS-CoV-2/FLU/RSV plus assay is intended as an aid in the diagnosis of influenza from Nasopharyngeal swab specimens and should not be used as a sole basis for treatment. Nasal washings and aspirates are unacceptable for Xpert Xpress SARS-CoV-2/FLU/RSV testing.  Fact Sheet for Patients: EntrepreneurPulse.com.au  Fact Sheet for Healthcare Providers: IncredibleEmployment.be  This test is not yet approved or cleared by the [place] FDA and has been authorized for detection and/or diagnosis of SARS-CoV-2 by FDA under an Emergency Use Authorization (EUA). This EUA will remain in effect (meaning this test can be used) for the duration of the COVID-19 declaration under Section 564(b)(1) of the Act, 21 U.S.C. section 360bbb-3(b)(1), unless the authorization is terminated or revoked.  Performed at [facility], [place] [address]  [place], [place] [address]   Culture, blood (routine x 2)     Status: None   Collection Time: 11/25/20 12:46 PM   Specimen: BLOOD LEFT WRIST  Result Value Ref Range Status   Specimen Description BLOOD LEFT WRIST  Final   Special Requests   Final    BOTTLES  DRAWN AEROBIC AND ANAEROBIC Blood Culture adequate volume   Culture   Final    NO GROWTH 5 DAYS Performed at [facility], [address], [place], [place] [address]    Report Status 11/30/2020 FINAL  Final  Culture, blood (routine x 2)     Status: None   Collection Time: 11/25/20 12:48 PM   Specimen: BLOOD  Result Value Ref Range Status   Specimen Description BLOOD LEFT ANTECUBITAL  Final   Special Requests   Final    BOTTLES DRAWN AEROBIC AND ANAEROBIC Blood Culture adequate volume   Culture   Final    NO GROWTH 5 DAYS Performed at [facility], [place] [address] [place], [place] [address]    Report Status 11/30/2020 FINAL  Final  SARS CORONAVIRUS 2 (TAT 6-24 HRS) Nasopharyngeal Nasopharyngeal Swab     Status: None   Collection Time: 12/01/20  2:40 PM   Specimen: Nasopharyngeal Swab  Result Value Ref Range Status   SARS Coronavirus 2 NEGATIVE NEGATIVE Final    Comment: (NOTE) SARS-CoV-2 target nucleic acids are NOT DETECTED.  The SARS-CoV-2 RNA is generally detectable in upper and lower respiratory specimens during the acute phase of infection. Negative results do not preclude SARS-CoV-2 infection, do not rule out co-infections with other pathogens, and should not be used as the sole basis for treatment or other patient management decisions. Negative results must be combined with clinical observations, patient history, and epidemiological information. The expected result is Negative.  Fact Sheet for Patients: SugarRoll.be  Fact Sheet for Healthcare Providers: https://www.woods-mathews.com/  This test is not yet approved or cleared by the [place] FDA and  has been authorized for detection and/or diagnosis of SARS-CoV-2 by FDA under an Emergency Use Authorization (EUA). This EUA will remain  in effect (meaning this test can be used) for the duration of the COVID-19 declaration under Se ction 564(b)(1) of the Act, 21  U.S.C. section 360bbb-3(b)(1), unless the authorization is terminated or revoked sooner.  Performed at [facility], [place] [address] [place], [place] [address]      Labs: Basic Metabolic Panel: Recent Labs  Lab 11/27/20 0445 11/28/20 0418 11/29/20 0206 11/30/20 0446  NA 136 136 137 138  K 4.4 4.5 4.2 3.9  CL 103 101 104 103  CO2 $Re'26 30 30 29  'UlO$ GLUCOSE 82 115* 96 92  BUN $Re'13 12 13 15  'tUn$ CREATININE 0.75 0.78 0.71 0.77  CALCIUM 8.1* 8.1* 8.1* 7.9*    Liver Function Tests: Recent Labs  Lab 11/27/20 0445 11/28/20 0418 11/30/20 0446  AST 29 34 38  ALT $Re'12 11 16  'bci$ ALKPHOS 57 53 49  BILITOT 0.5 0.5 0.5  PROT 5.1* 5.1* 5.0*  ALBUMIN 2.0* 1.9* 1.9*    No results for input(s): LIPASE, AMYLASE in the last 168 hours. No results for input(s): AMMONIA in the last 168 hours. CBC: Recent Labs  Lab 11/27/20 0445 11/28/20 0418 11/29/20 0206  WBC 19.9* 23.3* 19.6*  NEUTROABS 14.1* 19.9*  --   HGB 10.7* 10.3* 10.0*  HCT 33.1* 32.7* 30.4*  MCV 84.0 85.4 83.7  PLT 509* 510* 481*    Cardiac  Enzymes: No results for input(s): CKTOTAL, CKMB, CKMBINDEX, TROPONINI in the last 168 hours. BNP: BNP (last 3 results) Recent Labs    07/24/20 0411 07/25/20 0501 07/26/20 0622  BNP 105.0* 123.0* 71.0     ProBNP (last 3 results) No results for input(s): PROBNP in the last 8760 hours.  CBG: Recent Labs  Lab 12/02/20 0639 12/02/20 1123 12/02/20 1636 12/02/20 2047 12/03/20 0718  GLUCAP 91 121* 180* 121* 73        Signed:  Little Ishikawa DO   [facility] 12/03/2020, 8:12 AM

## 2020-12-03 NOTE — Progress Notes (Signed)
Physical Therapy Treatment Patient Details Name: Joel Perez MRN: [id number] DOB: 04/24/1946 Today's Date: 12/03/2020    History of Present Illness pt is a 75 y/o male admitted 7/7 with worsening weakness and R foot shown to have frank purulence.  R foot found to have osteomyelitis.  Pt has no furthe options for revascularization.  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  PMHx:  neuropathy, HTN, COPD, blindness R eye    PT Comments    Pt is making progress towards his goals today, however continues to be limited in safe mobility by R LE pain with movement, in presence of decreased strength, change of CoG affecting balance, as well as incontinence of urine and stool in movement. Pt performed LE AROM prior to mobilization. Pt currently requires min A for bed mobility and min guard for scooting transfer along EoB. Unable to attempt standing due to incontinence of stool and urine. D/c plans remain appropriate at this time. PT will continue to follow acutely.     Follow Up Recommendations  SNF     Equipment Recommendations  3in1 (PT);Other (comment);Wheelchair (measurements PT);Wheelchair cushion (measurements PT) (tub or shower seat)       Precautions / Restrictions Precautions Precautions: Fall Restrictions Weight Bearing Restrictions: Yes RLE Weight Bearing: Non weight bearing    Mobility  Bed Mobility Overal bed mobility: Needs Assistance Bed Mobility: Supine to Sit;Sit to Supine;Rolling Rolling: Min guard   Supine to sit: Min assist Sit to supine: Min assist   General bed mobility comments: maximal vc for sequencing to come to EoB, min A for pad scoot to square hips at EoB, min A for returning LE to bed, min guard for rolling to perform pericare    Transfers Overall transfer level: Needs assistance Equipment used: None Transfers: Lateral/Scoot Transfers          Lateral/Scoot Transfers: Min guard General transfer comment: decreased cuing due to pt agravation, told to scoot his hips up the  bed to the bed rail, able to scoot 1 foot before requesting to return to bed.        Balance Overall balance assessment: Needs assistance Sitting-balance support: No upper extremity supported;Feet supported;Feet unsupported Sitting balance-Leahy Scale: Fair                                      Cognition Arousal/Alertness: Awake/alert Behavior During Therapy: Flat affect;Agitated Overall Cognitive Status: Within Functional Limits for tasks assessed                                 General Comments: pt agravated by therapist voice, so very short, PT tried to limit vocalization      Exercises General Exercises - Lower Extremity Heel Slides: AROM;Left;5 reps;Supine Amputee Exercises Quad Sets: AROM;Both;5 reps;Supine Gluteal Sets: AROM;Both;10 reps;Supine Hip ABduction/ADduction: AROM;Both;5 reps;Supine Knee Flexion: AROM;Right;5 reps;Supine Knee Extension: AROM;Both;5 reps;Seated Straight Leg Raises: AROM;Both;5 reps;Supine Other Exercises Other Exercises: IS x5 max inhalation 839mL, cuing for slow, deep breath instead of short burst of inhalation    General Comments General comments (skin integrity, edema, etc.): VSS on 4L O2 via [place], wife present, Pure Flow in place working intermittently      Pertinent Vitals/Pain Pain Assessment: Faces Faces Pain Scale: Hurts little more Pain Location: amputation site Pain Descriptors / Indicators: Sore;Tender     PT Goals (current goals can  now be found in the care plan section) Acute Rehab PT Goals Patient Stated Goal: pt agreed home as soon as possible, as able as possible and get prosthesis PT Goal Formulation: With patient Time For Goal Achievement: 12/12/20 Potential to Achieve Goals: Good Progress towards PT goals: Progressing toward goals    Frequency    Min 3X/week      PT Plan Discharge plan needs to be updated    Co-evaluation              AM-PAC PT "6 Clicks" Mobility    Outcome Measure  Help needed turning from your back to your side while in a flat bed without using bedrails?: A Little Help needed moving from lying on your back to sitting on the side of a flat bed without using bedrails?: A Little Help needed moving to and from a bed to a chair (including a wheelchair)?: Total Help needed standing up from a chair using your arms (e.g., wheelchair or bedside chair)?: Total Help needed to walk in hospital room?: Total Help needed climbing 3-5 steps with a railing? : Total 6 Click Score: 10    End of Session Equipment Utilized During Treatment: Oxygen Activity Tolerance: Patient tolerated treatment well Patient left: in bed;with call bell/phone within reach;with bed alarm set;with family/visitor present Nurse Communication: Mobility status PT Visit Diagnosis: Other abnormalities of gait and mobility (R26.89);Pain Pain - Right/Left: Right Pain - part of body: Leg     Time: 2575-0518 PT Time Calculation (min) (ACUTE ONLY): 23 min  Charges:  $Therapeutic Exercise: 8-22 mins $Therapeutic Activity: 8-22 mins                     Onnie Hatchel B. Migdalia Dk PT, DPT Acute Rehabilitation Services Pager [phone] Office [phone]    [place] 12/03/2020, 11:11 AM

## 2020-12-03 NOTE — TOC Progression Note (Signed)
Transition of Care [facility]) - Progression Note    Patient Details  Name: Joel Perez MRN: [id number] Date of Birth: 1945-10-06  [facility] CM/SW Contact  [name] Mila Homer, LCSW Phone Number: 12/03/2020, 6:08 PM  Clinical Narrative:  CSW contacted Irven Shelling, admissions director to confirm that they can take patient for [facility]. Jackelyn Poling was not in the office and indicated that she would f/u with CSW once she returned to facility and again reviewed d at patient's information. CSW will f/u with Debbie on Wednesday.        Barriers to Discharge: Continued Medical Work up  Expected Discharge Plan and Services     Discharge Planning Services: CM Consult   Living arrangements for the past 2 months: Single Family Home Expected Discharge Date: 12/02/20                                    Social Determinants of Health (SDOH) Interventions  None needed or requested at this time.  Readmission Risk Interventions No flowsheet data found.

## 2020-12-03 NOTE — Consult Note (Signed)
   [facility] CM Inpatient Consult   12/03/2020  Joel Perez [birth year] [id number]  [place] [facility] [ACO] Patient: [employer]  Primary Care Provider:  [name], MD, listed.  Patient is noted for a skilled nursing facility transition for post facility care or complex disease management.  Plan:  No current [facility] Care Management needs assessed and post hospital care is for a skilled nursing facility level of care.  For questions or referrals, please contact:   Natividad Brood, RN BSN [facility] Liaison  [phone] business mobile phone Toll free office [phone]  Fax number: [phone] [email or web address]

## 2020-12-04 DIAGNOSIS — B9561 Methicillin susceptible Staphylococcus aureus infection as the cause of diseases classified elsewhere: Secondary | ICD-10-CM | POA: Diagnosis not present

## 2020-12-04 DIAGNOSIS — R7881 Bacteremia: Secondary | ICD-10-CM | POA: Diagnosis not present

## 2020-12-04 LAB — GLUCOSE, CAPILLARY
Glucose-Capillary: 87 mg/dL (ref 70–99)
Glucose-Capillary: 109 mg/dL — ABNORMAL HIGH (ref 70–99)
Glucose-Capillary: 140 mg/dL — ABNORMAL HIGH (ref 70–99)
Glucose-Capillary: 171 mg/dL — ABNORMAL HIGH (ref 70–99)

## 2020-12-04 NOTE — Progress Notes (Signed)
Physician Discharge Summary  Joel Perez HYW:[id number] DOB: [birth year] DOA: 11/23/2020  PCP: [name], MD  Admit date: 11/23/2020 Discharge date: 12/04/2020  Time spent: 37 minutes  Patient remains medically stable and agreeable for discharge to SNF - awaiting safe placement/disposition at this point.  No acute issues/events overnight.  Recommendations for Outpatient Follow-up:  Requires outpatient amputation management/postop care as per vascular surgeon Dr. Vanessa Barbara to remain in wound until 12/27/2020, weightbearing status deferred to surgeon's but probably nonweightbearing Continue Ancef as per OPAT orders in the outpatient setting for at least 6 weeks ending on 01/09/2019-[facility] physician Dr. Juleen China to follow-up in the outpatient setting regarding extension of this versus not Needs CBC Chem-12 1 week in outpatient setting Needs outpatient follow-up with Dr. [name] (pulmonology) regarding tapering of prednisone and follow-up right upper lobe 1.5 spiculated mass-I will CC him for this to be arranged Note dosage changes of various medications as per [facility] Will need outpatient discussion regarding prediabetes and sugars in addition to BMI 32 although suspect will be difficult with amputation for significant physical activity at this time Requires oxygen 4 L at all times given advanced COPD  Discharge Diagnoses:  Nonhealing right fifth toe resulting in R BKA 11/25/2020 MSSA bacteremia still present-extension of course of antibiotics till 8/18 - PICC line placed 7/10 to be removed at the end of therapy or as per infectious disease physician Dr. Juleen China  Please see below for itemized issues addressed in [facility]- refer to other progress notes for clarity if needed  Discharge Condition: Fair  Diet recommendation: Heart healthy low-salt-consider diabetic diet in the outpatient setting  Filed Weights   12/01/20 0621 12/01/20 2134 12/03/20 2055  Weight: 107.4 kg 107.4 kg 107.4 kg     History of present illness:   MSSA s/p R sided BKA 7/5 PM Blood culture X2 from 7/3 confirmed staph-surveillance cultures 7/5 neg Echocardiogram 7/6 no valvular vegetations--- TEE contemplated but deferred secondary to high oxygen requirement and risk of procedure therefore ID rec's 6 wk Abx ANCEF ending 01/08/21--PICC  placed 7/10-please remove PICC line once no longer required Oxy IR 5-10 every 4 --Gabapentin 300 twice daily for nerve pain, continue Flexeril 10 at bedtime in addition Stable for DC to skilled facility when bed available RLL infiltrate?  Postobstructive CXR 7/7 possible PNA-[unlikely--no sputum, no cough worse than prior]--completed 7/11-Ancef as above RUL 1.5 spiculated mass Severe COPD on 4 L of oxygen Outpatient follow-up Dr. Molli Posey will CC Dr. [name] on this discharge summary for care coordination regarding mass, COPD etc. Attempt in the outpatient setting to de-escalate steroids and will CC Dr. [name] to coordinate this Recent COVID-19 infection Status post treatment --curently asymptomatic, completed CDC guided quarantine HFpEF EF 60-65% Losartan resumed at 25 mg dose-May need to titrate in the outpatient Lasix 20 on hold Revatio 20 on hold till Op re-evl DM type 2, questionable history Lab Results  Component Value Date   HGBA1C 6.5 (H) 11/23/2020   Obesity BMI 32 Not on any prior to admission meds-sugars trending below 180 Discontinue sliding scale   Procedures: BKA as above  Consultations: Vascular surgery  Discharge Exam: Vitals:   12/03/20 2112 12/04/20 0507  BP:  131/73  Pulse:  86  Resp:  19  Temp:  97.8 F (36.6 C)  SpO2: 96% 99%    Subj on day of d/c   Well no issues On oxygen  General Exam on discharge  No distress  EOMI NCAT no focal deficit Moving limbs Chest is clear  No mass no swelling no rash Picture of wound from 7/10 included as below; bandage clean/dry/intact today    Discharge Instructions   Discharge  Instructions     [facility] pharmacist to adjust dose for Vancomycin, Aminoglycosides and other anti-infective therapies as requested by physician.   Complete by: As directed    [facility] to provide Cath Flo 36m   Complete by: As directed    Administer for PICC line occlusion and as ordered by physician for other access device issues.   Anaphylaxis Kit: Provided to treat any anaphylactic reaction to the medication being provided to the patient if First Dose or when requested by physician   Complete by: As directed    Epinephrine 124mml vial / amp: Administer 0.50m24m0.50ml56mubcutaneously once for moderate to severe anaphylaxis, nurse to call physician and pharmacy when reaction occurs and call 911 if needed for immediate care   Diphenhydramine 50mg15mIV vial: Administer 25-50mg 79mM PRN for first dose reaction, rash, itching, mild reaction, nurse to call physician and pharmacy when reaction occurs   Sodium Chloride 0.9% NS 500ml I67mdminister if needed for hypovolemic blood pressure drop or as ordered by physician after call to physician with anaphylactic reaction   Change dressing on IV access line weekly and PRN   Complete by: As directed    Diet - low sodium heart healthy   Complete by: As directed    Discharge wound care:   Complete by: As directed    As above   Flush IV access with Sodium Chloride 0.9% and Heparin 10 units/ml or 100 units/ml   Complete by: As directed    Home infusion instructions - [facility]   Complete by: As directed    Instructions: Flush IV access with Sodium Chloride 0.9% and Heparin 10units/ml or 100units/ml   Change dressing on IV access line: Weekly and PRN   Instructions Cath Flo 2mg: Ad49mister for PICC Line occlusion and as ordered by physician for other access device   [facility] pharmacist to adjust dose for: Vancomycin, Aminoglycosides and other anti-infective therapies as requested by physician    Increase activity slowly   Complete by: As directed    Method of administration may be changed at the discretion of home infusion pharmacist based upon assessment of the patient and/or caregiver's ability to self-administer the medication ordered   Complete by: As directed       Allergies as of 12/04/2020       Reactions   Other Swelling, Other (See Comments)   Farmed Fish (tightness in throat & lip swelling)   Codeine Rash   Sulfa Antibiotics Hives        Medication List     STOP taking these medications    loperamide 2 MG capsule Commonly known as: IMODIUM   oxyCODONE-acetaminophen 10-325 MG tablet Commonly known as: PERCOCET   saccharomyces boulardii 250 MG capsule Commonly known as: FLORASTOR   sildenafil 20 MG tablet Commonly known as: REVATIO       TAKE these medications    acetaminophen 325 MG tablet Commonly known as: TYLENOL Take 2 tablets (650 mg total) by mouth every 6 (six) hours.   albuterol (2.5 MG/3ML) 0.083% nebulizer solution Commonly known as: PROVENTIL USE 1 VIAL IN NEBULIZER EVERY 6 HOURS AS NEEDED FOR WHEEZING AND FOR SHORTNESS OF BREATH What changed: See the new instructions.   ProAir HFA 108 (90 Base) MCG/ACT inhaler Generic drug: albuterol INHALE 2 PUFFS BY MOUTH  EVERY 6 HOURS AS NEEDED FOR WHEEZING FOR SHORTNESS OF BREATH What changed: See the new instructions.   aspirin EC 81 MG tablet Take 81 mg by mouth daily. Swallow whole.   atorvastatin 40 MG tablet Commonly known as: LIPITOR TAKE 1 TABLET BY MOUTH ONCE DAILY . APPOINTMENT REQUIRED FOR FUTURE REFILLS What changed: See the new instructions.   Breztri Aerosphere 160-9-4.8 MCG/ACT Aero Generic drug: Budeson-Glycopyrrol-Formoterol Take 2 puffs first thing in am and then another 2 puffs about 12 hours later. What changed:  how much to take how to take this when to take this additional instructions   ceFAZolin  IVPB Commonly known as: ANCEF Inject 2 g into the vein every  8 (eight) hours. Indication:  osteomyelitis and recurrent MSSA bacteremia First Dose: Yes Last Day of Therapy:  01/08/2021 Labs - Once weekly:  CBC/D and BMP, Labs - Every other week:  ESR and CRP Method of administration: IV Push Method of administration may be changed at the discretion of home infusion pharmacist based upon assessment of the patient and/or caregiver's ability to self-administer the medication ordered.   cyclobenzaprine 10 MG tablet Commonly known as: FLEXERIL Take 10 mg by mouth at bedtime.   furosemide 20 MG tablet Commonly known as: LASIX TAKE 1 TABLET BY MOUTH ONCE DAILY What changed:  how much to take how to take this when to take this additional instructions   furosemide 20 MG tablet Commonly known as: LASIX Take 1 tablet (20 mg total) by mouth daily. What changed: Another medication with the same name was changed. Make sure you understand how and when to take each.   gabapentin 600 MG tablet Commonly known as: NEURONTIN Take 0.5 tablets (300 mg total) by mouth 2 (two) times daily. What changed:  how much to take when to take this   losartan 25 MG tablet Commonly known as: COZAAR Take 1 tablet (25 mg total) by mouth daily. What changed:  medication strength how much to take   Melatonin 10 MG Tabs Take 1 tablet by mouth at bedtime.   multivitamin with minerals Tabs tablet Take 1 tablet by mouth daily.   oxyCODONE 5 MG immediate release tablet Commonly known as: Oxy IR/ROXICODONE Take 1-2 tablets (5-10 mg total) by mouth every 4 (four) hours as needed for severe pain.   pantoprazole 40 MG tablet Commonly known as: PROTONIX Take 1 tablet (40 mg total) by mouth daily.   polyethylene glycol 17 g packet Commonly known as: MIRALAX / GLYCOLAX Take 17 g by mouth daily as needed for mild constipation.   predniSONE 10 MG tablet Commonly known as: DELTASONE 2 until better then one daily What changed: when to take this   sodium chloride 0.65 %  nasal spray Commonly known as: OCEAN Place 1 spray into the nose daily as needed for congestion.   vitamin B-12 1000 MCG tablet Commonly known as: CYANOCOBALAMIN Take 1,000 mcg by mouth daily.   vitamin C 500 MG tablet Commonly known as: ASCORBIC ACID Take 500 mg by mouth daily.               Discharge Care Instructions  (From admission, onward)           Start     Ordered   12/01/20 0000  Change dressing on IV access line weekly and PRN  (Home infusion instructions - [facility] )        12/01/20 1331   12/01/20 0000  Discharge wound care:  Comments: As above   12/01/20 1331           Allergies  Allergen Reactions   Other Swelling and Other (See Comments)    Farmed Fish (tightness in throat & lip swelling)   Codeine Rash   Sulfa Antibiotics Hives    Follow-up Information     [facility] Follow up in 1 month(s).   Why: The office will call the patient with an appointment Contact information: [place], [place], [place] Follow up.   Specialties: Infectious Diseases, Internal Medicine Why: 12/26/20 at 9am. Please call to schedule if you are not able to make this appointment. Contact information: [place] [place] [address] [phone]                  The results of significant diagnostics from this hospitalization (including imaging, microbiology, ancillary and laboratory) are listed below for reference.    Significant Diagnostic Studies: DG Chest 1 View  Result Date: 11/23/2020 CLINICAL DATA:  Concern for osteomyelitis of the RIGHT foot. Recent partial RIGHT foot amputation. Weakness. EXAM: CHEST  1 VIEW COMPARISON:  07/22/2020 FINDINGS: Stable cardiomegaly. Patchy opacity is identified in the RIGHT lung base, consistent with infectious infiltrate. There is no pulmonary edema. Within the RIGHT UPPER lobe, a 1.5 centimeter  spiculated mass and adjacent satellite nodule are again noted. IMPRESSION: 1. RIGHT LOWER lobe infiltrates. 2. RIGHT UPPER lobe masses, as seen on prior exams. Electronically Signed   By: Nolon Nations M.D.   On: 11/23/2020 14:35   DG Chest 2 View  Result Date: 11/27/2020 CLINICAL DATA:  Pneumonia follow-up EXAM: CHEST - 2 VIEW COMPARISON:  11/23/2020 FINDINGS: Persistent opacity at the right lung base. Nodular opacities are identified in the right lung. Increased density at the left lung base. Small bilateral pleural effusions. Similar cardiomediastinal contours. No pneumothorax. IMPRESSION: Persistent opacities at the right lung base and right lung nodules. Increased left basilar atelectasis/consolidation. Small bilateral pleural effusions. Electronically Signed   By: Macy Mis M.D.   On: 11/27/2020 09:08   MR FOOT RIGHT WO CONTRAST  Result Date: 11/25/2020 CLINICAL DATA:  History of partial fifth ray amputation 08/28/2020. Pain, swelling and open wound with hernial and discharge for 2 days. EXAM: MRI OF THE RIGHT FOREFOOT WITHOUT CONTRAST TECHNIQUE: Multiplanar, multisequence MR imaging of the right foot was performed. No intravenous contrast was administered. COMPARISON:  Radiograph 11/23/2020 FINDINGS: Extensive and advanced changes of osteomyelitis involving the residual fifth metatarsal which is destroyed. Adjacent fluid collection measures 4.5 cm and is highly suspicious for an abscess. There is also an open wound in this area. There is severe diffuse cellulitis and myofasciitis. The other bony structures are intact. IMPRESSION: 1. Extensive and advanced changes of osteomyelitis involving the residual fifth metatarsal. 2. Adjacent 4.5 cm fluid collection highly suspicious for an abscess. 3. Severe diffuse cellulitis and myofasciitis. Electronically Signed   By: Marijo Sanes M.D.   On: 11/25/2020 07:55   DG Foot Complete Right  Result Date: 11/23/2020 CLINICAL DATA:  Recent partial  amputation, concern for osteomyelitis. EXAM: RIGHT FOOT COMPLETE - 3+ VIEW COMPARISON:  08/25/2020 FINDINGS: Prior amputation of the small toe. Recognizable 3.1 cm proximal portion of the fifth metatarsal noted, lung distal margin of this there is a 3.8 cm region of amorphous hazy calcification suspicious for a destroyed bony fragment from osteomyelitis, less likely to be heterotopic  ossification along the prior amputation. No appreciable destructive findings along the remaining metatarsals or phalanges. Mildly exaggerated longitudinal arch of the foot on the lateral projection which is not obtained with the patient standing. Extensive soft tissue swelling along the forefoot especially dorsally. Irregularity of the cutaneous surface along the amputation site and lateral to the fifth metatarsal. Small Achilles calcaneal spur. IMPRESSION: 1. 3.8 cm region of amorphous hazy calcification corresponding to the shaft of the fifth metatarsal, probably bony destruction from active osteomyelitis and less likely to from heterotopic ossification along the site of prior amputation. There is a 3.1 cm fragment of proximal fifth metatarsal which appears recognizable, proximal to this hazy calcification. Soft tissue swelling and irregularity of the overlying soft tissues. Electronically Signed   By: Van Clines M.D.   On: 11/23/2020 15:10   ECHOCARDIOGRAM COMPLETE  Result Date: 11/26/2020    ECHOCARDIOGRAM REPORT   Patient Name:   Joel Perez Date of Exam: 11/26/2020 Medical Rec #:  [id number]     Height:       70.0 in Accession #:    [id number]    Weight:       227.7 lb Date of Birth:  1945-09-08     BSA:          2.206 m Patient Age:    54 years      BP:           135/63 mmHg Patient Gender: M             HR:           92 bpm. Exam Location:  Inpatient Procedure: 2D Echo, Cardiac Doppler and Color Doppler Indications:    bacteremia  History:        Patient has prior history of Echocardiogram examinations, most                  recent 07/22/2020. Risk Factors:Former Smoker.  Sonographer:    Cammy Brochure Referring Phys: [id number] [place]  1. Left ventricular ejection fraction, by estimation, is 60 to 65%. The left ventricle has normal function. The left ventricle has no regional wall motion abnormalities. There is mild left ventricular hypertrophy. Left ventricular diastolic parameters were normal.  2. Right ventricular systolic function is normal. The right ventricular size is normal.  3. The mitral valve is normal in structure. Trivial mitral valve regurgitation. No evidence of mitral stenosis.  4. The aortic valve is calcified. There is moderate calcification of the aortic valve. There is moderate thickening of the aortic valve. Aortic valve regurgitation is trivial. Mild aortic valve stenosis.  5. The inferior vena cava is normal in size with greater than 50% respiratory variability, suggesting right atrial pressure of 3 mmHg. Comparison(s): No significant change from prior study. Conclusion(s)/Recommendation(s): No evidence of valvular vegetations on this transthoracic echocardiogram. Would recommend a transesophageal echocardiogram to exclude infective endocarditis if clinically indicated. FINDINGS  Left Ventricle: Left ventricular ejection fraction, by estimation, is 60 to 65%. The left ventricle has normal function. The left ventricle has no regional wall motion abnormalities. The left ventricular internal cavity size was normal in size. There is  mild left ventricular hypertrophy. Left ventricular diastolic parameters were normal. Right Ventricle: The right ventricular size is normal. No increase in right ventricular wall thickness. Right ventricular systolic function is normal. Left Atrium: Left atrial size was normal in size. Right Atrium: Right atrial size was normal in size. Pericardium: There is no evidence of pericardial  effusion. Mitral Valve: The mitral valve is normal in structure. Trivial mitral  valve regurgitation. No evidence of mitral valve stenosis. Tricuspid Valve: The tricuspid valve is normal in structure. Tricuspid valve regurgitation is trivial. No evidence of tricuspid stenosis. Aortic Valve: The aortic valve is calcified. There is moderate calcification of the aortic valve. There is moderate thickening of the aortic valve. Aortic valve regurgitation is trivial. Mild aortic stenosis is present. Aortic valve mean gradient measures 5.7 mmHg. Aortic valve peak gradient measures 10.2 mmHg. Aortic valve area, by VTI measures 2.80 cm. Pulmonic Valve: The pulmonic valve was not well visualized. Pulmonic valve regurgitation is not visualized. Aorta: The aortic root and ascending aorta are structurally normal, with no evidence of dilitation. Venous: The inferior vena cava is normal in size with greater than 50% respiratory variability, suggesting right atrial pressure of 3 mmHg. IAS/Shunts: The atrial septum is grossly normal.  LEFT VENTRICLE PLAX 2D LVIDd:         4.50 cm LVIDs:         3.00 cm LV PW:         1.40 cm LV IVS:        0.90 cm LVOT diam:     2.10 cm LV SV:         75 LV SV Index:   34 LVOT Area:     3.46 cm  RIGHT VENTRICLE          IVC RV Basal diam:  3.50 cm  IVC diam: 0.70 cm LEFT ATRIUM             Index       RIGHT ATRIUM           Index LA diam:        3.00 cm 1.36 cm/m  RA Area:     11.50 cm LA Vol (A2C):   54.9 ml 24.89 ml/m RA Volume:   23.70 ml  10.74 ml/m LA Vol (A4C):   50.9 ml 23.08 ml/m LA Biplane Vol: 54.3 ml 24.62 ml/m  AORTIC VALVE AV Area (Vmax):    2.86 cm AV Area (Vmean):   2.82 cm AV Area (VTI):     2.80 cm AV Vmax:           159.33 cm/s AV Vmean:          110.000 cm/s AV VTI:            0.269 m AV Peak Grad:      10.2 mmHg AV Mean Grad:      5.7 mmHg LVOT Vmax:         131.67 cm/s LVOT Vmean:        89.567 cm/s LVOT VTI:          0.217 m LVOT/AV VTI ratio: 0.81  AORTA Ao Root diam: 3.30 cm Ao Asc diam:  2.90 cm  SHUNTS Systemic VTI:  0.22 m Systemic Diam: 2.10  cm Buford Dresser MD Electronically signed by Buford Dresser MD Signature Date/Time: 11/26/2020/2:45:08 PM    Final    [place] EKG Site Rite  Result Date: 11/28/2020 If Site Rite image not attached, placement could not be confirmed due to current cardiac rhythm.   Microbiology: Recent Results (from the past 240 hour(s))  Culture, blood (routine x 2)     Status: None   Collection Time: 11/25/20 12:46 PM   Specimen: BLOOD LEFT WRIST  Result Value Ref Range Status   Specimen Description BLOOD LEFT WRIST  Final  Special Requests   Final    BOTTLES DRAWN AEROBIC AND ANAEROBIC Blood Culture adequate volume   Culture   Final    NO GROWTH 5 DAYS Performed at [facility], [place] [address] [place], [place] [address]    Report Status 11/30/2020 FINAL  Final  Culture, blood (routine x 2)     Status: None   Collection Time: 11/25/20 12:48 PM   Specimen: BLOOD  Result Value Ref Range Status   Specimen Description BLOOD LEFT ANTECUBITAL  Final   Special Requests   Final    BOTTLES DRAWN AEROBIC AND ANAEROBIC Blood Culture adequate volume   Culture   Final    NO GROWTH 5 DAYS Performed at [facility], [place] [address], [place], [place] [address]    Report Status 11/30/2020 FINAL  Final  SARS CORONAVIRUS 2 (TAT 6-24 HRS) Nasopharyngeal Nasopharyngeal Swab     Status: None   Collection Time: 12/01/20  2:40 PM   Specimen: Nasopharyngeal Swab  Result Value Ref Range Status   SARS Coronavirus 2 NEGATIVE NEGATIVE Final    Comment: (NOTE) SARS-CoV-2 target nucleic acids are NOT DETECTED.  The SARS-CoV-2 RNA is generally detectable in upper and lower respiratory specimens during the acute phase of infection. Negative results do not preclude SARS-CoV-2 infection, do not rule out co-infections with other pathogens, and should not be used as the sole basis for treatment or other patient management decisions. Negative results must be combined with clinical observations, patient  history, and epidemiological information. The expected result is Negative.  Fact Sheet for Patients: SugarRoll.be  Fact Sheet for Healthcare Providers: https://www.woods-mathews.com/  This test is not yet approved or cleared by the [place] FDA and  has been authorized for detection and/or diagnosis of SARS-CoV-2 by FDA under an Emergency Use Authorization (EUA). This EUA will remain  in effect (meaning this test can be used) for the duration of the COVID-19 declaration under Se ction 564(b)(1) of the Act, 21 U.S.C. section 360bbb-3(b)(1), unless the authorization is terminated or revoked sooner.  Performed at [facility], [place] [address] [place], [place] [address]      Labs: Basic Metabolic Panel: Recent Labs  Lab 11/28/20 0418 11/29/20 0206 11/30/20 0446  NA 136 137 138  K 4.5 4.2 3.9  CL 101 104 103  CO2 _0 GLUCOSE 115* 96 92  BUN _1 CREATININE 0.78 0.71 0.77  CALCIUM 8.1* 8.1* 7.9*    Liver Function Tests: Recent Labs  Lab 11/28/20 0418 11/30/20 0446  AST 34 38  ALT 11 16  ALKPHOS 53 49  BILITOT 0.5 0.5  PROT 5.1* 5.0*  ALBUMIN 1.9* 1.9*    No results for input(s): LIPASE, AMYLASE in the last 168 hours. No results for input(s): AMMONIA in the last 168 hours. CBC: Recent Labs  Lab 11/28/20 0418 11/29/20 0206  WBC 23.3* 19.6*  NEUTROABS 19.9*  --   HGB 10.3* 10.0*  HCT 32.7* 30.4*  MCV 85.4 83.7  PLT 510* 481*    Cardiac Enzymes: No results for input(s): CKTOTAL, CKMB, CKMBINDEX, TROPONINI in the last 168 hours. BNP: BNP (last 3 results) Recent Labs    07/24/20 0411 07/25/20 0501 07/26/20 0622  BNP 105.0* 123.0* 71.0     ProBNP (last 3 results) No results for input(s): PROBNP in the last 8760 hours.  CBG: Recent Labs  Lab 12/03/20 0718 12/03/20 1131 12/03/20 1701 12/03/20 2125 12/04/20 0640  GLUCAP 73 112*  165* 102* 7        Signed:  Little Ishikawa DO   [facility] 12/04/2020, 8:08 AM

## 2020-12-05 DIAGNOSIS — B9561 Methicillin susceptible Staphylococcus aureus infection as the cause of diseases classified elsewhere: Secondary | ICD-10-CM | POA: Diagnosis not present

## 2020-12-05 DIAGNOSIS — R7881 Bacteremia: Secondary | ICD-10-CM | POA: Diagnosis not present

## 2020-12-05 LAB — GLUCOSE, CAPILLARY
Glucose-Capillary: 80 mg/dL (ref 70–99)
Glucose-Capillary: 105 mg/dL — ABNORMAL HIGH (ref 70–99)
Glucose-Capillary: 161 mg/dL — ABNORMAL HIGH (ref 70–99)
Glucose-Capillary: 117 mg/dL — ABNORMAL HIGH (ref 70–99)

## 2020-12-05 LAB — SARS CORONAVIRUS 2 (TAT 6-24 HRS): SARS Coronavirus 2: NEGATIVE

## 2020-12-05 NOTE — Progress Notes (Signed)
Physical Therapy Treatment Patient Details Name: Joel Perez MRN: [id number] DOB: Jul 01, 1945 Today's Date: 12/05/2020    History of Present Illness pt is a 75 y/o male admitted 7/7 with worsening weakness and R foot shown to have frank purulence.  R foot found to have osteomyelitis.  Pt has no furthe options for revascularization.  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  PMHx:  neuropathy, HTN, COPD, blindness R eye    PT Comments    Pt agreeable to therapy after he finishes his Ensure. Pt reports reluctance as his Pure Flow has not been leaking. PT encouraged him to use Pure Flow before getting up to EoB. Large amount of urine noted to enter canister. Pt then able to sit EoB with min guard but increased pt effort. Pt inquired about trying to come to standing. PT happy to oblige, unfortunately pt unable to clear hips from EOB x2 with total A from therapist. With attempt to stand pt bed noted to be saturated in urine. Pt sat EoB without assist for 5 minutes while dry linens and additional help procured to change linens. Pt requires min A for return of LE to bed and to roll onto back. Pt with improved participation today. PT continues to recommend SNF level rehab prior to return home. PT will continue to follow acutely.     Follow Up Recommendations  SNF     Equipment Recommendations  3in1 (PT);Other (comment);Wheelchair (measurements PT);Wheelchair cushion (measurements PT) (tub or shower seat)    Recommendations for Other Services       Precautions / Restrictions Precautions Precautions: Fall Restrictions Weight Bearing Restrictions: Yes RLE Weight Bearing: Non weight bearing    Mobility  Bed Mobility Overal bed mobility: Needs Assistance Bed Mobility: Supine to Sit;Sit to Supine     Supine to sit: Min guard;HOB elevated Sit to supine: Min assist   General bed mobility comments: min guard for safety with coming to EoB, pt with increased effort, holding his breath will coming to EoB despite cuing  for exahalation while exerting himself    Transfers Overall transfer level: Needs assistance Equipment used: Rolling walker (2 wheeled) Transfers: Sit to/from Stand Sit to Stand: From elevated surface;Total assist         General transfer comment: 2x attempts to come to standigng with total A of therapist, unable to clear hips from bed surface,vc for looking up and for pelvic rotationpt continues to hold breath during exertion despite cuing      Balance Overall balance assessment: Needs assistance Sitting-balance support: No upper extremity supported;Feet supported;Feet unsupported Sitting balance-Leahy Scale: Fair         Standing balance comment: unable                            Cognition Arousal/Alertness: Awake/alert Behavior During Therapy: Flat affect;Agitated Overall Cognitive Status: Within Functional Limits for tasks assessed                                 General Comments: pt apologized for behavior earlier in the week, reports having difficulty with coming to terms with amputaion         General Comments General comments (skin integrity, edema, etc.): Pt on 4L O2 via [place], RN reports pt with 10 beat of VTach during attempts to stand      Pertinent Vitals/Pain Pain Assessment: Faces Faces Pain Scale: Hurts little  more Pain Location: amputation site Pain Descriptors / Indicators: Sore;Tender Pain Intervention(s): Limited activity within patient's tolerance;Monitored during session;Repositioned     PT Goals (current goals can now be found in the care plan section) Acute Rehab PT Goals Patient Stated Goal: pt agreed home as soon as possible, as able as possible and get prosthesis PT Goal Formulation: With patient Time For Goal Achievement: 12/12/20 Potential to Achieve Goals: Good Progress towards PT goals: Progressing toward goals    Frequency    Min 3X/week      PT Plan Discharge plan needs to be updated        AM-PAC PT "6 Clicks" Mobility   Outcome Measure  Help needed turning from your back to your side while in a flat bed without using bedrails?: A Little Help needed moving from lying on your back to sitting on the side of a flat bed without using bedrails?: A Little Help needed moving to and from a bed to a chair (including a wheelchair)?: Total Help needed standing up from a chair using your arms (e.g., wheelchair or bedside chair)?: Total Help needed to walk in hospital room?: Total Help needed climbing 3-5 steps with a railing? : Total 6 Click Score: 10    End of Session Equipment Utilized During Treatment: Oxygen Activity Tolerance: Patient tolerated treatment well Patient left: in bed;with call bell/phone within reach;with bed alarm set;with family/visitor present Nurse Communication: Mobility status PT Visit Diagnosis: Other abnormalities of gait and mobility (R26.89);Pain Pain - Right/Left: Right Pain - part of body: Leg     Time: 7544-9201 PT Time Calculation (min) (ACUTE ONLY): 26 min  Charges:  $Therapeutic Activity: 8-22 mins                     Coe Angelos B. Migdalia Dk PT, DPT Acute Rehabilitation Services Pager [phone] Office [phone]    [place] 12/05/2020, 3:32 PM

## 2020-12-05 NOTE — Progress Notes (Signed)
Occupational Therapy Treatment Patient Details Name: Joel Perez MRN: [id number] DOB: January 12, 1946 Today's Date: 12/05/2020    History of present illness pt is a 75 y/o male admitted 7/7 with worsening weakness and R foot shown to have frank purulence.  R foot found to have osteomyelitis.  Pt has no furthe options for revascularization.  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  PMHx:  neuropathy, HTN, COPD, blindness R eye   OT comments  Pt reluctantly agreeable to OT session. Pt appeared agitated throughout session, was very short verbally with therapist at end of session pt stated "I'm not a mean person, I just haven't accepted this yet." Therapist validated pt's feelings and and reinforced the grieving process associated with amputations. Pt required minguard for bed mobility and completed grooming after setup assistance while sitting EOB. He required minguard for lateral scooting toward HOB. Pt demonstrated heavy reliance of use of BUE and momentum. Pt limited by decreased activity tolerance, generalized weakness, recent R BKA, pain and instability. Pt will continue to benefit from skilled OT services to maximize safety and independence with ADL/IADL and functional mobility. Will continue to follow acutely and progress as tolerated.    Follow Up Recommendations  SNF    Equipment Recommendations  None recommended by OT    Recommendations for Other Services      Precautions / Restrictions Precautions Precautions: Fall Restrictions Weight Bearing Restrictions: Yes RLE Weight Bearing: Non weight bearing Other Position/Activity Restrictions: limb protector for mobility       Mobility Bed Mobility Overal bed mobility: Needs Assistance Bed Mobility: Supine to Sit;Sit to Supine     Supine to sit: Min guard Sit to supine: Min guard   General bed mobility comments: minguard for safety, pt with heavy reliance on bed rails and heavy use of momentum, increased time and effort    Transfers Overall transfer  level: Needs assistance Equipment used: None Transfers: Lateral/Scoot Transfers          Lateral/Scoot Transfers: Min guard General transfer comment: pt requesting therapist to not cue pt during mobility, he required minguard for scooting toward [facility] with heavy reliance on BUE use and minimal use of LLE to assist with scooting. Educated pt on importance of therapist providing instruction, pt continued to appear agitated    Balance Overall balance assessment: Needs assistance Sitting-balance support: No upper extremity supported;Feet supported;Feet unsupported Sitting balance-Leahy Scale: Fair                                     ADL either performed or assessed with clinical judgement   ADL Overall ADL's : Needs assistance/impaired Eating/Feeding: Independent   Grooming: Wash/dry face;Set up                         [place] Manipulation Details (indicate cue type and reason): totalA for toileting, pt unable to use bed pan due sacral wounds     Functional mobility during ADLs:  (unable to stand) General ADL Comments: lateral scooting on EOB to progress mobility, pt required minguard for functional mobility     Vision       Perception     Praxis      Cognition Arousal/Alertness: Awake/alert Behavior During Therapy: Flat affect;Agitated Overall Cognitive Status: Within Functional Limits for tasks assessed  General Comments: pt appeared agitated, very short with therapist and frequently stating "lets get this over with". at the end of the session pt stated "I'm not a mean person, I just haven't accepted this yet", communicated with pt that these feelings are valid and part of the grieving process associated with amputations        Exercises     Shoulder Instructions       General Comments VSS on 4lnc    Pertinent Vitals/ Pain       Pain Assessment: 0-10 Pain Score: 5  Pain  Location: amputation site Pain Descriptors / Indicators: Sore;Tender Pain Intervention(s): Limited activity within patient's tolerance;Monitored during session  Home Living                                          Prior Functioning/Environment              Frequency  Min 2X/week        Progress Toward Goals  OT Goals(current goals can now be found in the care plan section)  Progress towards OT goals: Progressing toward goals  Acute Rehab OT Goals Patient Stated Goal: pt agreed home as soon as possible, as able as possible and get prosthesis OT Goal Formulation: With patient Time For Goal Achievement: 12/12/20 Potential to Achieve Goals: Good ADL Goals Pt Will Perform Grooming: with modified independence;sitting Pt Will Perform Upper Body Bathing: with set-up;sitting Pt Will Perform Lower Body Bathing: with min assist;sitting/lateral leans Pt Will Perform Upper Body Dressing: with set-up;sitting Pt Will Perform Lower Body Dressing: with min assist;sitting/lateral leans Pt Will Transfer to Toilet: with min assist;squat pivot transfer Pt Will Perform Toileting - Clothing Manipulation and hygiene: with min assist;sitting/lateral leans  Plan Discharge plan needs to be updated    Co-evaluation                 AM-PAC OT "6 Clicks" Daily Activity     Outcome Measure   Help from another person eating meals?: A Little Help from another person taking care of personal grooming?: A Little Help from another person toileting, which includes using toliet, bedpan, or urinal?: Total Help from another person bathing (including washing, rinsing, drying)?: A Lot Help from another person to put on and taking off regular upper body clothing?: A Little Help from another person to put on and taking off regular lower body clothing?: Total 6 Click Score: 13    End of Session Equipment Utilized During Treatment: Oxygen  OT Visit Diagnosis: Unsteadiness on feet  (R26.81);Other abnormalities of gait and mobility (R26.89);Muscle weakness (generalized) (M62.81)   Activity Tolerance Patient tolerated treatment well   Patient Left in bed;with call bell/phone within reach;with nursing/sitter in room;with family/visitor present   Nurse Communication Mobility status        Time: 0370-4888 OT Time Calculation (min): 23 min  Charges: OT General Charges $OT Visit: 1 Visit OT Treatments $Self Care/Home Management : 23-37 mins  Helene Kelp OTR/L Acute Rehabilitation Services Office: [place] 12/05/2020, 11:53 AM

## 2020-12-05 NOTE — Care Management Important Message (Signed)
Important Message  Patient Details  Name: Joel Perez MRN: [id number] Date of Birth: 08-01-1945   Medicare Important Message Given:  Yes - Important Message mailed due to current National Emergency   Verbal consent obtained due to current National Emergency  Relationship to patient: Self Contact Name: Samanyu Call Date: 12/05/20  Time: 1047 Phone: [phone] Outcome: Spoke with contact Important Message mailed to: Patient address on file    [name] 12/05/2020, 10:48 AM

## 2020-12-05 NOTE — TOC Progression Note (Addendum)
Transition of Care [facility]) - Progression Note    Patient Details  Name: Joel Perez MRN: [id number] Date of Birth: 05/04/1946  Transition of Care [facility] CM/SW Contact  [name] Mila Homer, LCSW Phone Number: 12/05/2020, 3:20 PM  Clinical Narrative:  Visited with patient and his wife,Joel Perez at bedside regarding his discharge to a SNF for ST rehab. They were informed that CSW had contacted the admissions director Debbie at [facility] twice (talked to her first phone call and message left second call) and was awaiting a call back to determine if they could accept. CSW explained that Jackelyn Poling had to check to assure they could pay for the IV abx - Cefazolin IV PB.      Contacted Debbie while in room with patient and wife (2:25 pm) and they can take patient. Debbie advised that CSW would initiate insurance authorization and per Jackelyn Poling, they can take patient tomorrow. She requested that SW call her at [phone] on Saturday regarding the discharge. MD advised and COVID test requested.  Patient is fully vaccinated: 06/15/19, 07/06/19, 04/07/20 (1st booster), and 12/01/20 (2nd booster).   **Submitted for insurance authorization today and received approval: Effective 7/16-7/19; Alen Bleacher #[id number]; next review date 12/09/20. Debbie at [facility] provided with Josem Kaufmann information and can take patient on Saturday. She requested this number [phone]) be used to reach her on Saturday, 7/16.     Barriers to Discharge: Continued Medical Work up  Expected Discharge Plan and Services     Discharge Planning Services: CM Consult   Living arrangements for the past 2 months: Single Family Home Expected Discharge Date: 12/02/20                                     Social Determinants of Health (SDOH) Interventions  No SDOH interventions requested or needed at this time.  Readmission Risk Interventions No flowsheet data found.

## 2020-12-05 NOTE — Progress Notes (Signed)
OT Cancellation Note  Patient Details Name: Joel Perez MRN: [id number] DOB: [birth year]   Cancelled Treatment:    Reason Eval/Treat Not Completed: Patient declined, no reason specified Pt appeared agitated when prompted to participate in OT session. Pt stated "I just want to be left alone". Pt reported need for BM and asked therapist to return later this date. OT will return as time allows and pt is appropriate.   [facility] OTR/L Acute Rehabilitation Services Office: [place] 12/05/2020, 10:06 AM

## 2020-12-05 NOTE — Plan of Care (Signed)
  Problem: Education: Goal: Knowledge of General Education information will improve Description Including pain rating scale, medication(s)/side effects and non-pharmacologic comfort measures Outcome: Progressing   

## 2020-12-05 NOTE — Progress Notes (Signed)
Physician Discharge Summary  Joel Perez WVP:[id number] DOB: [birth year] DOA: 11/23/2020  PCP: [name], MD  Admit date: 11/23/2020 Discharge date: 12/05/2020  Time spent: 37 minutes  Patient remains medically stable and agreeable for discharge to SNF - awaiting safe placement/disposition at this point.  No acute issues/events overnight.  Recommendations for Outpatient Follow-up:  Requires outpatient amputation management/postop care as per vascular surgeon Dr. Vanessa Barbara to remain in wound until 12/27/2020, weightbearing status deferred to surgeon's but probably nonweightbearing Continue Ancef as per OPAT orders in the outpatient setting for at least 6 weeks ending on 01/09/2019-[facility] physician Dr. Juleen China to follow-up in the outpatient setting regarding extension of this versus not Needs CBC Chem-12 1 week in outpatient setting Needs outpatient follow-up with Dr. [name] (pulmonology) regarding tapering of prednisone and follow-up right upper lobe 1.5 spiculated mass-I will CC him for this to be arranged Note dosage changes of various medications as per [facility] Will need outpatient discussion regarding prediabetes and sugars in addition to BMI 32 although suspect will be difficult with amputation for significant physical activity at this time Requires oxygen 4 L at all times given advanced COPD  Discharge Diagnoses:  Nonhealing right fifth toe resulting in R BKA 11/25/2020 MSSA bacteremia still present-extension of course of antibiotics till 8/18 - PICC line placed 7/10 to be removed at the end of therapy or as per infectious disease physician Dr. Juleen China  Please see below for itemized issues addressed in [facility]- refer to other progress notes for clarity if needed  Discharge Condition: Fair  Diet recommendation: Heart healthy low-salt-consider diabetic diet in the outpatient setting  Filed Weights   12/01/20 0621 12/01/20 2134 12/03/20 2055  Weight: 107.4 kg 107.4 kg 107.4 kg     History of present illness:   MSSA s/p R sided BKA 7/5 PM Blood culture X2 from 7/3 confirmed staph-surveillance cultures 7/5 neg Echocardiogram 7/6 no valvular vegetations--- TEE contemplated but deferred secondary to high oxygen requirement and risk of procedure therefore ID rec's 6 wk Abx ANCEF ending 01/08/21--PICC  placed 7/10-please remove PICC line once no longer required Oxy IR 5-10 every 4 --Gabapentin 300 twice daily for nerve pain, continue Flexeril 10 at bedtime in addition Stable for DC to skilled facility when bed available RLL infiltrate?  Postobstructive CXR 7/7 possible PNA-[unlikely--no sputum, no cough worse than prior]--completed 7/11-Ancef as above RUL 1.5 spiculated mass Severe COPD on 4 L of oxygen Outpatient follow-up Dr. Molli Posey will CC Dr. [name] on this discharge summary for care coordination regarding mass, COPD etc. Attempt in the outpatient setting to de-escalate steroids and will CC Dr. [name] to coordinate this Recent COVID-19 infection Status post treatment --curently asymptomatic, completed CDC guided quarantine HFpEF EF 60-65% Losartan resumed at 25 mg dose-May need to titrate in the outpatient Lasix 20 on hold Revatio 20 on hold till Op re-evl DM type 2, questionable history Lab Results  Component Value Date   HGBA1C 6.5 (H) 11/23/2020   Obesity BMI 32 Not on any prior to admission meds-sugars trending below 180 Discontinue sliding scale   Procedures: BKA as above  Consultations: Vascular surgery  Discharge Exam: Vitals:   12/05/20 0533 12/05/20 0742  BP: 125/69   Pulse: 82   Resp: 18   Temp: (!) 97.5 F (36.4 C)   SpO2: 98% 96%    Subj on day of d/c   Well no issues On oxygen  General Exam on discharge  No distress  EOMI NCAT no focal deficit Moving limbs Chest is  clear  No mass no swelling no rash Picture of wound from 7/10 included as below; bandage clean/dry/intact today    Discharge Instructions   Discharge  Instructions     [facility] pharmacist to adjust dose for Vancomycin, Aminoglycosides and other anti-infective therapies as requested by physician.   Complete by: As directed    [facility] to provide Cath Flo 2mg    Complete by: As directed    Administer for PICC line occlusion and as ordered by physician for other access device issues.   Anaphylaxis Kit: Provided to treat any anaphylactic reaction to the medication being provided to the patient if First Dose or when requested by physician   Complete by: As directed    Epinephrine 1mg /ml vial / amp: Administer 0.3mg  (0.31ml) subcutaneously once for moderate to severe anaphylaxis, nurse to call physician and pharmacy when reaction occurs and call 911 if needed for immediate care   Diphenhydramine 50mg /ml IV vial: Administer 25-50mg  IV/IM PRN for first dose reaction, rash, itching, mild reaction, nurse to call physician and pharmacy when reaction occurs   Sodium Chloride 0.9% NS 554ml IV: Administer if needed for hypovolemic blood pressure drop or as ordered by physician after call to physician with anaphylactic reaction   Change dressing on IV access line weekly and PRN   Complete by: As directed    Diet - low sodium heart healthy   Complete by: As directed    Discharge wound care:   Complete by: As directed    As above   Flush IV access with Sodium Chloride 0.9% and Heparin 10 units/ml or 100 units/ml   Complete by: As directed    Home infusion instructions - [facility]   Complete by: As directed    Instructions: Flush IV access with Sodium Chloride 0.9% and Heparin 10units/ml or 100units/ml   Change dressing on IV access line: Weekly and PRN   Instructions Cath Flo 2mg : Administer for PICC Line occlusion and as ordered by physician for other access device   [facility] pharmacist to adjust dose for: Vancomycin, Aminoglycosides and other anti-infective therapies as requested by physician    Increase activity slowly   Complete by: As directed    Method of administration may be changed at the discretion of home infusion pharmacist based upon assessment of the patient and/or caregiver's ability to self-administer the medication ordered   Complete by: As directed       Allergies as of 12/05/2020       Reactions   Other Swelling, Other (See Comments)   Farmed Fish (tightness in throat & lip swelling)   Codeine Rash   Sulfa Antibiotics Hives        Medication List     STOP taking these medications    loperamide 2 MG capsule Commonly known as: IMODIUM   oxyCODONE-acetaminophen 10-325 MG tablet Commonly known as: PERCOCET   saccharomyces boulardii 250 MG capsule Commonly known as: FLORASTOR   sildenafil 20 MG tablet Commonly known as: REVATIO       TAKE these medications    acetaminophen 325 MG tablet Commonly known as: TYLENOL Take 2 tablets (650 mg total) by mouth every 6 (six) hours.   albuterol (2.5 MG/3ML) 0.083% nebulizer solution Commonly known as: PROVENTIL USE 1 VIAL IN NEBULIZER EVERY 6 HOURS AS NEEDED FOR WHEEZING AND FOR SHORTNESS OF BREATH What changed: See the new instructions.   ProAir HFA 108 (90 Base) MCG/ACT inhaler Generic drug: albuterol INHALE 2 PUFFS  BY MOUTH EVERY 6 HOURS AS NEEDED FOR WHEEZING FOR SHORTNESS OF BREATH What changed: See the new instructions.   aspirin EC 81 MG tablet Take 81 mg by mouth daily. Swallow whole.   atorvastatin 40 MG tablet Commonly known as: LIPITOR TAKE 1 TABLET BY MOUTH ONCE DAILY . APPOINTMENT REQUIRED FOR FUTURE REFILLS What changed: See the new instructions.   Breztri Aerosphere 160-9-4.8 MCG/ACT Aero Generic drug: Budeson-Glycopyrrol-Formoterol Take 2 puffs first thing in am and then another 2 puffs about 12 hours later. What changed:  how much to take how to take this when to take this additional instructions   ceFAZolin  IVPB Commonly known as: ANCEF Inject 2 g into the vein every  8 (eight) hours. Indication:  osteomyelitis and recurrent MSSA bacteremia First Dose: Yes Last Day of Therapy:  01/08/2021 Labs - Once weekly:  CBC/D and BMP, Labs - Every other week:  ESR and CRP Method of administration: IV Push Method of administration may be changed at the discretion of home infusion pharmacist based upon assessment of the patient and/or caregiver's ability to self-administer the medication ordered.   cyclobenzaprine 10 MG tablet Commonly known as: FLEXERIL Take 10 mg by mouth at bedtime.   furosemide 20 MG tablet Commonly known as: LASIX TAKE 1 TABLET BY MOUTH ONCE DAILY What changed:  how much to take how to take this when to take this additional instructions   furosemide 20 MG tablet Commonly known as: LASIX Take 1 tablet (20 mg total) by mouth daily. What changed: Another medication with the same name was changed. Make sure you understand how and when to take each.   gabapentin 600 MG tablet Commonly known as: NEURONTIN Take 0.5 tablets (300 mg total) by mouth 2 (two) times daily. What changed:  how much to take when to take this   losartan 25 MG tablet Commonly known as: COZAAR Take 1 tablet (25 mg total) by mouth daily. What changed:  medication strength how much to take   Melatonin 10 MG Tabs Take 1 tablet by mouth at bedtime.   multivitamin with minerals Tabs tablet Take 1 tablet by mouth daily.   oxyCODONE 5 MG immediate release tablet Commonly known as: Oxy IR/ROXICODONE Take 1-2 tablets (5-10 mg total) by mouth every 4 (four) hours as needed for severe pain.   pantoprazole 40 MG tablet Commonly known as: PROTONIX Take 1 tablet (40 mg total) by mouth daily.   polyethylene glycol 17 g packet Commonly known as: MIRALAX / GLYCOLAX Take 17 g by mouth daily as needed for mild constipation.   predniSONE 10 MG tablet Commonly known as: DELTASONE 2 until better then one daily What changed: when to take this   sodium chloride 0.65 %  nasal spray Commonly known as: OCEAN Place 1 spray into the nose daily as needed for congestion.   vitamin B-12 1000 MCG tablet Commonly known as: CYANOCOBALAMIN Take 1,000 mcg by mouth daily.   vitamin C 500 MG tablet Commonly known as: ASCORBIC ACID Take 500 mg by mouth daily.               Discharge Care Instructions  (From admission, onward)           Start     Ordered   12/01/20 0000  Change dressing on IV access line weekly and PRN  (Home infusion instructions - [facility] )        12/01/20 1331   12/01/20 0000  Discharge wound care:  Comments: As above   12/01/20 1331           Allergies  Allergen Reactions   Other Swelling and Other (See Comments)    Farmed Fish (tightness in throat & lip swelling)   Codeine Rash   Sulfa Antibiotics Hives    Follow-up Information     [facility] Follow up in 1 month(s).   Why: The office will call the patient with an appointment Contact information: [place], [place], DO Follow up.   Specialties: Infectious Diseases, Internal Medicine Why: 12/26/20 at 9am. Please call to schedule if you are not able to make this appointment. Contact information: [place] [address] [phone]                  The results of significant diagnostics from this hospitalization (including imaging, microbiology, ancillary and laboratory) are listed below for reference.    Significant Diagnostic Studies: DG Chest 1 View  Result Date: 11/23/2020 CLINICAL DATA:  Concern for osteomyelitis of the RIGHT foot. Recent partial RIGHT foot amputation. Weakness. EXAM: CHEST  1 VIEW COMPARISON:  07/22/2020 FINDINGS: Stable cardiomegaly. Patchy opacity is identified in the RIGHT lung base, consistent with infectious infiltrate. There is no pulmonary edema. Within the RIGHT UPPER lobe, a 1.5 centimeter  spiculated mass and adjacent satellite nodule are again noted. IMPRESSION: 1. RIGHT LOWER lobe infiltrates. 2. RIGHT UPPER lobe masses, as seen on prior exams. Electronically Signed   By: Nolon Nations M.D.   On: 11/23/2020 14:35   DG Chest 2 View  Result Date: 11/27/2020 CLINICAL DATA:  Pneumonia follow-up EXAM: CHEST - 2 VIEW COMPARISON:  11/23/2020 FINDINGS: Persistent opacity at the right lung base. Nodular opacities are identified in the right lung. Increased density at the left lung base. Small bilateral pleural effusions. Similar cardiomediastinal contours. No pneumothorax. IMPRESSION: Persistent opacities at the right lung base and right lung nodules. Increased left basilar atelectasis/consolidation. Small bilateral pleural effusions. Electronically Signed   By: Macy Mis M.D.   On: 11/27/2020 09:08   MR FOOT RIGHT WO CONTRAST  Result Date: 11/25/2020 CLINICAL DATA:  History of partial fifth ray amputation 08/28/2020. Pain, swelling and open wound with hernial and discharge for 2 days. EXAM: MRI OF THE RIGHT FOREFOOT WITHOUT CONTRAST TECHNIQUE: Multiplanar, multisequence MR imaging of the right foot was performed. No intravenous contrast was administered. COMPARISON:  Radiograph 11/23/2020 FINDINGS: Extensive and advanced changes of osteomyelitis involving the residual fifth metatarsal which is destroyed. Adjacent fluid collection measures 4.5 cm and is highly suspicious for an abscess. There is also an open wound in this area. There is severe diffuse cellulitis and myofasciitis. The other bony structures are intact. IMPRESSION: 1. Extensive and advanced changes of osteomyelitis involving the residual fifth metatarsal. 2. Adjacent 4.5 cm fluid collection highly suspicious for an abscess. 3. Severe diffuse cellulitis and myofasciitis. Electronically Signed   By: Marijo Sanes M.D.   On: 11/25/2020 07:55   DG Foot Complete Right  Result Date: 11/23/2020 CLINICAL DATA:  Recent partial  amputation, concern for osteomyelitis. EXAM: RIGHT FOOT COMPLETE - 3+ VIEW COMPARISON:  08/25/2020 FINDINGS: Prior amputation of the small toe. Recognizable 3.1 cm proximal portion of the fifth metatarsal noted, lung distal margin of this there is a 3.8 cm region of amorphous hazy calcification suspicious for a destroyed bony fragment from osteomyelitis, less likely to be heterotopic  ossification along the prior amputation. No appreciable destructive findings along the remaining metatarsals or phalanges. Mildly exaggerated longitudinal arch of the foot on the lateral projection which is not obtained with the patient standing. Extensive soft tissue swelling along the forefoot especially dorsally. Irregularity of the cutaneous surface along the amputation site and lateral to the fifth metatarsal. Small Achilles calcaneal spur. IMPRESSION: 1. 3.8 cm region of amorphous hazy calcification corresponding to the shaft of the fifth metatarsal, probably bony destruction from active osteomyelitis and less likely to from heterotopic ossification along the site of prior amputation. There is a 3.1 cm fragment of proximal fifth metatarsal which appears recognizable, proximal to this hazy calcification. Soft tissue swelling and irregularity of the overlying soft tissues. Electronically Signed   By: Van Clines M.D.   On: 11/23/2020 15:10   ECHOCARDIOGRAM COMPLETE  Result Date: 11/26/2020    ECHOCARDIOGRAM REPORT   Patient Name:   [name] Date of Exam: 11/26/2020 Medical Rec #:  [id number]     Height:       70.0 in Accession #:    [id number]    Weight:       227.7 lb Date of Birth:  1945-09-08     BSA:          2.206 m Patient Age:    54 years      BP:           135/63 mmHg Patient Gender: M             HR:           92 bpm. Exam Location:  Inpatient Procedure: 2D Echo, Cardiac Doppler and Color Doppler Indications:    bacteremia  History:        Patient has prior history of Echocardiogram examinations, most                  recent 07/22/2020. Risk Factors:Former Smoker.  Sonographer:    Cammy Brochure Referring Phys: [id number] [place]  1. Left ventricular ejection fraction, by estimation, is 60 to 65%. The left ventricle has normal function. The left ventricle has no regional wall motion abnormalities. There is mild left ventricular hypertrophy. Left ventricular diastolic parameters were normal.  2. Right ventricular systolic function is normal. The right ventricular size is normal.  3. The mitral valve is normal in structure. Trivial mitral valve regurgitation. No evidence of mitral stenosis.  4. The aortic valve is calcified. There is moderate calcification of the aortic valve. There is moderate thickening of the aortic valve. Aortic valve regurgitation is trivial. Mild aortic valve stenosis.  5. The inferior vena cava is normal in size with greater than 50% respiratory variability, suggesting right atrial pressure of 3 mmHg. Comparison(s): No significant change from prior study. Conclusion(s)/Recommendation(s): No evidence of valvular vegetations on this transthoracic echocardiogram. Would recommend a transesophageal echocardiogram to exclude infective endocarditis if clinically indicated. FINDINGS  Left Ventricle: Left ventricular ejection fraction, by estimation, is 60 to 65%. The left ventricle has normal function. The left ventricle has no regional wall motion abnormalities. The left ventricular internal cavity size was normal in size. There is  mild left ventricular hypertrophy. Left ventricular diastolic parameters were normal. Right Ventricle: The right ventricular size is normal. No increase in right ventricular wall thickness. Right ventricular systolic function is normal. Left Atrium: Left atrial size was normal in size. Right Atrium: Right atrial size was normal in size. Pericardium: There is no evidence of pericardial  effusion. Mitral Valve: The mitral valve is normal in structure. Trivial mitral  valve regurgitation. No evidence of mitral valve stenosis. Tricuspid Valve: The tricuspid valve is normal in structure. Tricuspid valve regurgitation is trivial. No evidence of tricuspid stenosis. Aortic Valve: The aortic valve is calcified. There is moderate calcification of the aortic valve. There is moderate thickening of the aortic valve. Aortic valve regurgitation is trivial. Mild aortic stenosis is present. Aortic valve mean gradient measures 5.7 mmHg. Aortic valve peak gradient measures 10.2 mmHg. Aortic valve area, by VTI measures 2.80 cm. Pulmonic Valve: The pulmonic valve was not well visualized. Pulmonic valve regurgitation is not visualized. Aorta: The aortic root and ascending aorta are structurally normal, with no evidence of dilitation. Venous: The inferior vena cava is normal in size with greater than 50% respiratory variability, suggesting right atrial pressure of 3 mmHg. IAS/Shunts: The atrial septum is grossly normal.  LEFT VENTRICLE PLAX 2D LVIDd:         4.50 cm LVIDs:         3.00 cm LV PW:         1.40 cm LV IVS:        0.90 cm LVOT diam:     2.10 cm LV SV:         75 LV SV Index:   34 LVOT Area:     3.46 cm  RIGHT VENTRICLE          IVC RV Basal diam:  3.50 cm  IVC diam: 0.70 cm LEFT ATRIUM             Index       RIGHT ATRIUM           Index LA diam:        3.00 cm 1.36 cm/m  RA Area:     11.50 cm LA Vol (A2C):   54.9 ml 24.89 ml/m RA Volume:   23.70 ml  10.74 ml/m LA Vol (A4C):   50.9 ml 23.08 ml/m LA Biplane Vol: 54.3 ml 24.62 ml/m  AORTIC VALVE AV Area (Vmax):    2.86 cm AV Area (Vmean):   2.82 cm AV Area (VTI):     2.80 cm AV Vmax:           159.33 cm/s AV Vmean:          110.000 cm/s AV VTI:            0.269 m AV Peak Grad:      10.2 mmHg AV Mean Grad:      5.7 mmHg LVOT Vmax:         131.67 cm/s LVOT Vmean:        89.567 cm/s LVOT VTI:          0.217 m LVOT/AV VTI ratio: 0.81  AORTA Ao Root diam: 3.30 cm Ao Asc diam:  2.90 cm  SHUNTS Systemic VTI:  0.22 m Systemic Diam: 2.10  cm Buford Dresser MD Electronically signed by Buford Dresser MD Signature Date/Time: 11/26/2020/2:45:08 PM    Final    [place] EKG Site Rite  Result Date: 11/28/2020 If Site Rite image not attached, placement could not be confirmed due to current cardiac rhythm.   Microbiology: Recent Results (from the past 240 hour(s))  Culture, blood (routine x 2)     Status: None   Collection Time: 11/25/20 12:46 PM   Specimen: BLOOD LEFT WRIST  Result Value Ref Range Status   Specimen Description BLOOD LEFT WRIST  Final  Special Requests   Final    BOTTLES DRAWN AEROBIC AND ANAEROBIC Blood Culture adequate volume   Culture   Final    NO GROWTH 5 DAYS Performed at [facility], [place] [address], [place], [place] [address]    Report Status 11/30/2020 FINAL  Final  Culture, blood (routine x 2)     Status: None   Collection Time: 11/25/20 12:48 PM   Specimen: BLOOD  Result Value Ref Range Status   Specimen Description BLOOD LEFT ANTECUBITAL  Final   Special Requests   Final    BOTTLES DRAWN AEROBIC AND ANAEROBIC Blood Culture adequate volume   Culture   Final    NO GROWTH 5 DAYS Performed at [facility], [place] [address], [place], [place] [address]    Report Status 11/30/2020 FINAL  Final  SARS CORONAVIRUS 2 (TAT 6-24 HRS) Nasopharyngeal Nasopharyngeal Swab     Status: None   Collection Time: 12/01/20  2:40 PM   Specimen: Nasopharyngeal Swab  Result Value Ref Range Status   SARS Coronavirus 2 NEGATIVE NEGATIVE Final    Comment: (NOTE) SARS-CoV-2 target nucleic acids are NOT DETECTED.  The SARS-CoV-2 RNA is generally detectable in upper and lower respiratory specimens during the acute phase of infection. Negative results do not preclude SARS-CoV-2 infection, do not rule out co-infections with other pathogens, and should not be used as the sole basis for treatment or other patient management decisions. Negative results must be combined with clinical observations, patient  history, and epidemiological information. The expected result is Negative.  Fact Sheet for Patients: SugarRoll.be  Fact Sheet for Healthcare Providers: https://www.woods-mathews.com/  This test is not yet approved or cleared by the [place] FDA and  has been authorized for detection and/or diagnosis of SARS-CoV-2 by FDA under an Emergency Use Authorization (EUA). This EUA will remain  in effect (meaning this test can be used) for the duration of the COVID-19 declaration under Se ction 564(b)(1) of the Act, 21 U.S.C. section 360bbb-3(b)(1), unless the authorization is terminated or revoked sooner.  Performed at [facility], [place] [address] [place], [place] [address]      Labs: Basic Metabolic Panel: Recent Labs  Lab 11/29/20 0206 11/30/20 0446  NA 137 138  K 4.2 3.9  CL 104 103  CO2 30 29  GLUCOSE 96 92  BUN 13 15  CREATININE 0.71 0.77  CALCIUM 8.1* 7.9*    Liver Function Tests: Recent Labs  Lab 11/30/20 0446  AST 38  ALT 16  ALKPHOS 49  BILITOT 0.5  PROT 5.0*  ALBUMIN 1.9*    No results for input(s): LIPASE, AMYLASE in the last 168 hours. No results for input(s): AMMONIA in the last 168 hours. CBC: Recent Labs  Lab 11/29/20 0206  WBC 19.6*  HGB 10.0*  HCT 30.4*  MCV 83.7  PLT 481*    Cardiac Enzymes: No results for input(s): CKTOTAL, CKMB, CKMBINDEX, TROPONINI in the last 168 hours. BNP: BNP (last 3 results) Recent Labs    07/24/20 0411 07/25/20 0501 07/26/20 0622  BNP 105.0* 123.0* 71.0     ProBNP (last 3 results) No results for input(s): PROBNP in the last 8760 hours.  CBG: Recent Labs  Lab 12/04/20 0640 12/04/20 1153 12/04/20 1642 12/04/20 2105 12/05/20 0633  GLUCAP 87 109* 140* 171* 80        Signed:  Little Ishikawa DO   [facility] 12/05/2020, 8:08 AM

## 2020-12-06 DIAGNOSIS — R7881 Bacteremia: Secondary | ICD-10-CM | POA: Diagnosis not present

## 2020-12-06 DIAGNOSIS — B9561 Methicillin susceptible Staphylococcus aureus infection as the cause of diseases classified elsewhere: Secondary | ICD-10-CM | POA: Diagnosis not present

## 2020-12-06 LAB — GLUCOSE, CAPILLARY
Glucose-Capillary: 82 mg/dL (ref 70–99)
Glucose-Capillary: 149 mg/dL — ABNORMAL HIGH (ref 70–99)
Glucose-Capillary: 160 mg/dL — ABNORMAL HIGH (ref 70–99)

## 2020-12-06 NOTE — Discharge Summary (Signed)
Physician Discharge Summary  Joel Perez YCX:[id number] DOB: Feb 04, 1946 DOA: 11/23/2020  PCP: [name], MD  Admit date: 11/23/2020 Discharge date: 12/06/2020  Time spent: 37 minutes  Patient remains medically stable and agreeable for discharge to SNF - awaiting safe placement/disposition at this point.  No acute issues/events overnight.  Recommendations for Outpatient Follow-up:  Requires outpatient amputation management/postop care as per vascular surgeon Dr. Vanessa Barbara to remain in wound until 12/27/2020, weightbearing status deferred to surgeon's but probably nonweightbearing Continue Ancef as per OPAT orders in the outpatient setting for at least 6 weeks ending on 01/09/2019-[facility] physician Dr. Juleen China to follow-up in the outpatient setting regarding extension of this versus not Needs CBC Chem-12 1 week in outpatient setting Needs outpatient follow-up with Dr. [name] (pulmonology) regarding tapering of prednisone and follow-up right upper lobe 1.5 spiculated mass-I will CC him for this to be arranged Note dosage changes of various medications as per [facility] Will need outpatient discussion regarding prediabetes and sugars in addition to BMI 32 although suspect will be difficult with amputation for significant physical activity at this time Requires oxygen 4 L at all times given advanced COPD  Discharge Diagnoses:  Nonhealing right fifth toe resulting in R BKA 11/25/2020 MSSA bacteremia still present-extension of course of antibiotics till 8/18 - PICC line placed 7/10 to be removed at the end of therapy or as per infectious disease physician Dr. Juleen China  Please see below for itemized issues addressed in [facility]- refer to other progress notes for clarity if needed  Discharge Condition: Fair  Diet recommendation: Heart healthy low-salt-consider diabetic diet in the outpatient setting  Filed Weights   12/01/20 0621 12/01/20 2134 12/03/20 2055  Weight: 107.4 kg 107.4 kg 107.4 kg     History of present illness:   MSSA s/p R sided BKA 7/5 PM Blood culture X2 from 7/3 confirmed staph-surveillance cultures 7/5 neg Echocardiogram 7/6 no valvular vegetations--- TEE contemplated but deferred secondary to high oxygen requirement and risk of procedure therefore ID rec's 6 wk Abx ANCEF ending 01/08/21--PICC  placed 7/10-please remove PICC line once no longer required Oxy IR 5-10 every 4 --Gabapentin 300 twice daily for nerve pain, continue Flexeril 10 at bedtime in addition Stable for DC to skilled facility when bed available RLL infiltrate?  Postobstructive CXR 7/7 possible PNA-[unlikely--no sputum, no cough worse than prior]--completed 7/11-Ancef as above RUL 1.5 spiculated mass Severe COPD on 4 L of oxygen Outpatient follow-up Dr. Molli Posey will CC Dr. [name] on this discharge summary for care coordination regarding mass, COPD etc. Attempt in the outpatient setting to de-escalate steroids and will CC Dr. [name] to coordinate this Recent COVID-19 infection Status post treatment --curently asymptomatic, completed CDC guided quarantine HFpEF EF 60-65% Losartan resumed at 25 mg dose-May need to titrate in the outpatient Lasix 20 on hold Revatio 20 on hold till Op re-evl DM type 2, questionable history Lab Results  Component Value Date   HGBA1C 6.5 (H) 11/23/2020   Obesity BMI 32 Not on any prior to admission meds-sugars trending below 180 Discontinue sliding scale   Procedures: BKA as above  Consultations: Vascular surgery  Discharge Exam: Vitals:   12/06/20 0524 12/06/20 0656  BP: 133/75 138/72  Pulse: 85 84  Resp: 16 18  Temp: 97.7 F (36.5 C) 97.8 F (36.6 C)  SpO2: 97% 100%    Subj on day of d/c   Well no issues On oxygen  General Exam on discharge  No distress  EOMI NCAT no focal deficit Moving limbs  Chest is clear  No mass no swelling no rash Picture of wound from 7/10 included as below; bandage clean/dry/intact today    Discharge  Instructions   Discharge Instructions     [facility] pharmacist to adjust dose for Vancomycin, Aminoglycosides and other anti-infective therapies as requested by physician.   Complete by: As directed    [facility] to provide Cath Flo 20m   Complete by: As directed    Administer for PICC line occlusion and as ordered by physician for other access device issues.   Anaphylaxis Kit: Provided to treat any anaphylactic reaction to the medication being provided to the patient if First Dose or when requested by physician   Complete by: As directed    Epinephrine 127mml vial / amp: Administer 0.25m71m0.25ml30mubcutaneously once for moderate to severe anaphylaxis, nurse to call physician and pharmacy when reaction occurs and call 911 if needed for immediate care   Diphenhydramine 50mg725mIV vial: Administer 25-50mg 67mM PRN for first dose reaction, rash, itching, mild reaction, nurse to call physician and pharmacy when reaction occurs   Sodium Chloride 0.9% NS 500ml I56mdminister if needed for hypovolemic blood pressure drop or as ordered by physician after call to physician with anaphylactic reaction   Call MD for:  redness, tenderness, or signs of infection (pain, swelling, redness, odor or green/yellow discharge around incision site)   Complete by: As directed    Call MD for:  severe uncontrolled pain   Complete by: As directed    Call MD for:  temperature >100.4   Complete by: As directed    Change dressing on IV access line weekly and PRN   Complete by: As directed    Diet - low sodium heart healthy   Complete by: As directed    Discharge wound care:   Complete by: As directed    As above   Discharge wound care:   Complete by: As directed    Wound Care: Every morning      Comments: Xeroform gauze over right bKA incision.  Cover with 4 x 4 and kerlix then compression sock over this.  Please change once daily   Wound care  Daily      Comments: Cleanse wounds on the  upper, posterior thighs with soap and water, pat dry. Place foam dressings over the wounds.  Perform daily and prn dislodgement or soiling   Flush IV access with Sodium Chloride 0.9% and Heparin 10 units/ml or 100 units/ml   Complete by: As directed    Home infusion instructions - [facility]   Complete by: As directed    Instructions: Flush IV access with Sodium Chloride 0.9% and Heparin 10units/ml or 100units/ml   Change dressing on IV access line: Weekly and PRN   Instructions Cath Flo 2mg: Ad62mister for PICC Line occlusion and as ordered by physician for other access device   [facility] pharmacist to adjust dose for: Vancomycin, Aminoglycosides and other anti-infective therapies as requested by physician   Increase activity slowly   Complete by: As directed    Increase activity slowly   Complete by: As directed    Method of administration may be changed at the discretion of home infusion pharmacist based upon assessment of the patient and/or caregiver's ability to self-administer the medication ordered   Complete by: As directed       Allergies as of 12/06/2020       Reactions   Other Swelling, Other (See  Comments)   Farmed Fish (tightness in throat & lip swelling)   Codeine Rash   Sulfa Antibiotics Hives        Medication List     STOP taking these medications    loperamide 2 MG capsule Commonly known as: IMODIUM   oxyCODONE-acetaminophen 10-325 MG tablet Commonly known as: PERCOCET   saccharomyces boulardii 250 MG capsule Commonly known as: FLORASTOR   sildenafil 20 MG tablet Commonly known as: REVATIO       TAKE these medications    acetaminophen 325 MG tablet Commonly known as: TYLENOL Take 2 tablets (650 mg total) by mouth every 6 (six) hours.   albuterol (2.5 MG/3ML) 0.083% nebulizer solution Commonly known as: PROVENTIL USE 1 VIAL IN NEBULIZER EVERY 6 HOURS AS NEEDED FOR WHEEZING AND FOR SHORTNESS OF BREATH What changed: See  the new instructions.   ProAir HFA 108 (90 Base) MCG/ACT inhaler Generic drug: albuterol INHALE 2 PUFFS BY MOUTH EVERY 6 HOURS AS NEEDED FOR WHEEZING FOR SHORTNESS OF BREATH What changed: See the new instructions.   aspirin EC 81 MG tablet Take 81 mg by mouth daily. Swallow whole.   atorvastatin 40 MG tablet Commonly known as: LIPITOR TAKE 1 TABLET BY MOUTH ONCE DAILY . APPOINTMENT REQUIRED FOR FUTURE REFILLS What changed: See the new instructions.   Breztri Aerosphere 160-9-4.8 MCG/ACT Aero Generic drug: Budeson-Glycopyrrol-Formoterol Take 2 puffs first thing in am and then another 2 puffs about 12 hours later. What changed:  how much to take how to take this when to take this additional instructions   ceFAZolin  IVPB Commonly known as: ANCEF Inject 2 g into the vein every 8 (eight) hours. Indication:  osteomyelitis and recurrent MSSA bacteremia First Dose: Yes Last Day of Therapy:  01/08/2021 Labs - Once weekly:  CBC/D and BMP, Labs - Every other week:  ESR and CRP Method of administration: IV Push Method of administration may be changed at the discretion of home infusion pharmacist based upon assessment of the patient and/or caregiver's ability to self-administer the medication ordered.   cyclobenzaprine 10 MG tablet Commonly known as: FLEXERIL Take 10 mg by mouth at bedtime.   furosemide 20 MG tablet Commonly known as: LASIX TAKE 1 TABLET BY MOUTH ONCE DAILY What changed:  how much to take how to take this when to take this additional instructions   furosemide 20 MG tablet Commonly known as: LASIX Take 1 tablet (20 mg total) by mouth daily. What changed: Another medication with the same name was changed. Make sure you understand how and when to take each.   gabapentin 600 MG tablet Commonly known as: NEURONTIN Take 0.5 tablets (300 mg total) by mouth 2 (two) times daily. What changed:  how much to take when to take this   losartan 25 MG tablet Commonly  known as: COZAAR Take 1 tablet (25 mg total) by mouth daily. What changed:  medication strength how much to take   Melatonin 10 MG Tabs Take 1 tablet by mouth at bedtime.   multivitamin with minerals Tabs tablet Take 1 tablet by mouth daily.   oxyCODONE 5 MG immediate release tablet Commonly known as: Oxy IR/ROXICODONE Take 1-2 tablets (5-10 mg total) by mouth every 4 (four) hours as needed for severe pain.   pantoprazole 40 MG tablet Commonly known as: PROTONIX Take 1 tablet (40 mg total) by mouth daily.   polyethylene glycol 17 g packet Commonly known as: MIRALAX / GLYCOLAX Take 17 g by mouth daily as needed  for mild constipation.   predniSONE 10 MG tablet Commonly known as: DELTASONE 2 until better then one daily What changed: when to take this   sodium chloride 0.65 % nasal spray Commonly known as: OCEAN Place 1 spray into the nose daily as needed for congestion.   vitamin B-12 1000 MCG tablet Commonly known as: CYANOCOBALAMIN Take 1,000 mcg by mouth daily.   vitamin C 500 MG tablet Commonly known as: ASCORBIC ACID Take 500 mg by mouth daily.               Discharge Care Instructions  (From admission, onward)           Start     Ordered   12/06/20 0000  Discharge wound care:       Comments: Wound Care: Every morning      Comments: Xeroform gauze over right bKA incision.  Cover with 4 x 4 and kerlix then compression sock over this.  Please change once daily   Wound care  Daily      Comments: Cleanse wounds on the upper, posterior thighs with soap and water, pat dry. Place foam dressings over the wounds.  Perform daily and prn dislodgement or soiling   12/06/20 0755   12/01/20 0000  Change dressing on IV access line weekly and PRN  (Home infusion instructions - [facility] )        12/01/20 1331   12/01/20 0000  Discharge wound care:       Comments: As above   12/01/20 1331           Allergies  Allergen Reactions   Other  Swelling and Other (See Comments)    Farmed Fish (tightness in throat & lip swelling)   Codeine Rash   Sulfa Antibiotics Hives    Follow-up Information     [facility] Follow up in 1 month(s).   Why: The office will call the patient with an appointment Contact information: [place], [place], [place] Follow up.   Specialties: Infectious Diseases, Internal Medicine Why: 12/26/20 at 9am. Please call to schedule if you are not able to make this appointment. Contact information: [place] [address] [phone]                  The results of significant diagnostics from this hospitalization (including imaging, microbiology, ancillary and laboratory) are listed below for reference.    Significant Diagnostic Studies: DG Chest 1 View  Result Date: 11/23/2020 CLINICAL DATA:  Concern for osteomyelitis of the RIGHT foot. Recent partial RIGHT foot amputation. Weakness. EXAM: CHEST  1 VIEW COMPARISON:  07/22/2020 FINDINGS: Stable cardiomegaly. Patchy opacity is identified in the RIGHT lung base, consistent with infectious infiltrate. There is no pulmonary edema. Within the RIGHT UPPER lobe, a 1.5 centimeter spiculated mass and adjacent satellite nodule are again noted. IMPRESSION: 1. RIGHT LOWER lobe infiltrates. 2. RIGHT UPPER lobe masses, as seen on prior exams. Electronically Signed   By: Nolon Nations M.D.   On: 11/23/2020 14:35   DG Chest 2 View  Result Date: 11/27/2020 CLINICAL DATA:  Pneumonia follow-up EXAM: CHEST - 2 VIEW COMPARISON:  11/23/2020 FINDINGS: Persistent opacity at the right lung base. Nodular opacities are identified in the right lung. Increased density at the left lung base. Small bilateral pleural effusions. Similar cardiomediastinal contours. No pneumothorax. IMPRESSION: Persistent opacities at the right  lung base and right lung nodules. Increased left  basilar atelectasis/consolidation. Small bilateral pleural effusions. Electronically Signed   By: Macy Mis M.D.   On: 11/27/2020 09:08   MR FOOT RIGHT WO CONTRAST  Result Date: 11/25/2020 CLINICAL DATA:  History of partial fifth ray amputation 08/28/2020. Pain, swelling and open wound with hernial and discharge for 2 days. EXAM: MRI OF THE RIGHT FOREFOOT WITHOUT CONTRAST TECHNIQUE: Multiplanar, multisequence MR imaging of the right foot was performed. No intravenous contrast was administered. COMPARISON:  Radiograph 11/23/2020 FINDINGS: Extensive and advanced changes of osteomyelitis involving the residual fifth metatarsal which is destroyed. Adjacent fluid collection measures 4.5 cm and is highly suspicious for an abscess. There is also an open wound in this area. There is severe diffuse cellulitis and myofasciitis. The other bony structures are intact. IMPRESSION: 1. Extensive and advanced changes of osteomyelitis involving the residual fifth metatarsal. 2. Adjacent 4.5 cm fluid collection highly suspicious for an abscess. 3. Severe diffuse cellulitis and myofasciitis. Electronically Signed   By: Marijo Sanes M.D.   On: 11/25/2020 07:55   DG Foot Complete Right  Result Date: 11/23/2020 CLINICAL DATA:  Recent partial amputation, concern for osteomyelitis. EXAM: RIGHT FOOT COMPLETE - 3+ VIEW COMPARISON:  08/25/2020 FINDINGS: Prior amputation of the small toe. Recognizable 3.1 cm proximal portion of the fifth metatarsal noted, lung distal margin of this there is a 3.8 cm region of amorphous hazy calcification suspicious for a destroyed bony fragment from osteomyelitis, less likely to be heterotopic ossification along the prior amputation. No appreciable destructive findings along the remaining metatarsals or phalanges. Mildly exaggerated longitudinal arch of the foot on the lateral projection which is not obtained with the patient standing. Extensive soft tissue swelling along the forefoot especially  dorsally. Irregularity of the cutaneous surface along the amputation site and lateral to the fifth metatarsal. Small Achilles calcaneal spur. IMPRESSION: 1. 3.8 cm region of amorphous hazy calcification corresponding to the shaft of the fifth metatarsal, probably bony destruction from active osteomyelitis and less likely to from heterotopic ossification along the site of prior amputation. There is a 3.1 cm fragment of proximal fifth metatarsal which appears recognizable, proximal to this hazy calcification. Soft tissue swelling and irregularity of the overlying soft tissues. Electronically Signed   By: Van Clines M.D.   On: 11/23/2020 15:10   ECHOCARDIOGRAM COMPLETE  Result Date: 11/26/2020    ECHOCARDIOGRAM REPORT   Patient Name:   [name] Date of Exam: 11/26/2020 Medical Rec #:  [id number]     Height:       70.0 in Accession #:    [id number]    Weight:       227.7 lb Date of Birth:  1945-09-17     BSA:          2.206 m Patient Age:    23 years      BP:           135/63 mmHg Patient Gender: M             HR:           92 bpm. Exam Location:  Inpatient Procedure: 2D Echo, Cardiac Doppler and Color Doppler Indications:    bacteremia  History:        Patient has prior history of Echocardiogram examinations, most                 recent 07/22/2020. Risk Factors:Former Smoker.  Sonographer:    Cammy Brochure Referring Phys:  [id number] [place]  1. Left ventricular ejection fraction, by estimation, is 60 to 65%. The left ventricle has normal function. The left ventricle has no regional wall motion abnormalities. There is mild left ventricular hypertrophy. Left ventricular diastolic parameters were normal.  2. Right ventricular systolic function is normal. The right ventricular size is normal.  3. The mitral valve is normal in structure. Trivial mitral valve regurgitation. No evidence of mitral stenosis.  4. The aortic valve is calcified. There is moderate calcification of the aortic valve.  There is moderate thickening of the aortic valve. Aortic valve regurgitation is trivial. Mild aortic valve stenosis.  5. The inferior vena cava is normal in size with greater than 50% respiratory variability, suggesting right atrial pressure of 3 mmHg. Comparison(s): No significant change from prior study. Conclusion(s)/Recommendation(s): No evidence of valvular vegetations on this transthoracic echocardiogram. Would recommend a transesophageal echocardiogram to exclude infective endocarditis if clinically indicated. FINDINGS  Left Ventricle: Left ventricular ejection fraction, by estimation, is 60 to 65%. The left ventricle has normal function. The left ventricle has no regional wall motion abnormalities. The left ventricular internal cavity size was normal in size. There is  mild left ventricular hypertrophy. Left ventricular diastolic parameters were normal. Right Ventricle: The right ventricular size is normal. No increase in right ventricular wall thickness. Right ventricular systolic function is normal. Left Atrium: Left atrial size was normal in size. Right Atrium: Right atrial size was normal in size. Pericardium: There is no evidence of pericardial effusion. Mitral Valve: The mitral valve is normal in structure. Trivial mitral valve regurgitation. No evidence of mitral valve stenosis. Tricuspid Valve: The tricuspid valve is normal in structure. Tricuspid valve regurgitation is trivial. No evidence of tricuspid stenosis. Aortic Valve: The aortic valve is calcified. There is moderate calcification of the aortic valve. There is moderate thickening of the aortic valve. Aortic valve regurgitation is trivial. Mild aortic stenosis is present. Aortic valve mean gradient measures 5.7 mmHg. Aortic valve peak gradient measures 10.2 mmHg. Aortic valve area, by VTI measures 2.80 cm. Pulmonic Valve: The pulmonic valve was not well visualized. Pulmonic valve regurgitation is not visualized. Aorta: The aortic root and  ascending aorta are structurally normal, with no evidence of dilitation. Venous: The inferior vena cava is normal in size with greater than 50% respiratory variability, suggesting right atrial pressure of 3 mmHg. IAS/Shunts: The atrial septum is grossly normal.  LEFT VENTRICLE PLAX 2D LVIDd:         4.50 cm LVIDs:         3.00 cm LV PW:         1.40 cm LV IVS:        0.90 cm LVOT diam:     2.10 cm LV SV:         75 LV SV Index:   34 LVOT Area:     3.46 cm  RIGHT VENTRICLE          IVC RV Basal diam:  3.50 cm  IVC diam: 0.70 cm LEFT ATRIUM             Index       RIGHT ATRIUM           Index LA diam:        3.00 cm 1.36 cm/m  RA Area:     11.50 cm LA Vol (A2C):   54.9 ml 24.89 ml/m RA Volume:   23.70 ml  10.74 ml/m LA Vol (A4C):   50.9  ml 23.08 ml/m LA Biplane Vol: 54.3 ml 24.62 ml/m  AORTIC VALVE AV Area (Vmax):    2.86 cm AV Area (Vmean):   2.82 cm AV Area (VTI):     2.80 cm AV Vmax:           159.33 cm/s AV Vmean:          110.000 cm/s AV VTI:            0.269 m AV Peak Grad:      10.2 mmHg AV Mean Grad:      5.7 mmHg LVOT Vmax:         131.67 cm/s LVOT Vmean:        89.567 cm/s LVOT VTI:          0.217 m LVOT/AV VTI ratio: 0.81  AORTA Ao Root diam: 3.30 cm Ao Asc diam:  2.90 cm  SHUNTS Systemic VTI:  0.22 m Systemic Diam: 2.10 cm Buford Dresser MD Electronically signed by Buford Dresser MD Signature Date/Time: 11/26/2020/2:45:08 PM    Final    [place] EKG Site Rite  Result Date: 11/28/2020 If Site Rite image not attached, placement could not be confirmed due to current cardiac rhythm.   Microbiology: Recent Results (from the past 240 hour(s))  SARS CORONAVIRUS 2 (TAT 6-24 HRS) Nasopharyngeal Nasopharyngeal Swab     Status: None   Collection Time: 12/01/20  2:40 PM   Specimen: Nasopharyngeal Swab  Result Value Ref Range Status   SARS Coronavirus 2 NEGATIVE NEGATIVE Final    Comment: (NOTE) SARS-CoV-2 target nucleic acids are NOT DETECTED.  The SARS-CoV-2 RNA is generally detectable  in upper and lower respiratory specimens during the acute phase of infection. Negative results do not preclude SARS-CoV-2 infection, do not rule out co-infections with other pathogens, and should not be used as the sole basis for treatment or other patient management decisions. Negative results must be combined with clinical observations, patient history, and epidemiological information. The expected result is Negative.  Fact Sheet for Patients: SugarRoll.be  Fact Sheet for Healthcare Providers: https://www.woods-mathews.com/  This test is not yet approved or cleared by the [place] FDA and  has been authorized for detection and/or diagnosis of SARS-CoV-2 by FDA under an Emergency Use Authorization (EUA). This EUA will remain  in effect (meaning this test can be used) for the duration of the COVID-19 declaration under Se ction 564(b)(1) of the Act, 21 U.S.C. section 360bbb-3(b)(1), unless the authorization is terminated or revoked sooner.  Performed at [facility], [place] [address], [place], [place] [address]   SARS CORONAVIRUS 2 (TAT 6-24 HRS) Nasopharyngeal Nasopharyngeal Swab     Status: None   Collection Time: 12/05/20  3:10 PM   Specimen: Nasopharyngeal Swab  Result Value Ref Range Status   SARS Coronavirus 2 NEGATIVE NEGATIVE Final    Comment: (NOTE) SARS-CoV-2 target nucleic acids are NOT DETECTED.  The SARS-CoV-2 RNA is generally detectable in upper and lower respiratory specimens during the acute phase of infection. Negative results do not preclude SARS-CoV-2 infection, do not rule out co-infections with other pathogens, and should not be used as the sole basis for treatment or other patient management decisions. Negative results must be combined with clinical observations, patient history, and epidemiological information. The expected result is Negative.  Fact Sheet for  Patients: SugarRoll.be  Fact Sheet for Healthcare Providers: https://www.woods-mathews.com/  This test is not yet approved or cleared by the [place] FDA and  has been authorized for detection and/or  diagnosis of SARS-CoV-2 by FDA under an Emergency Use Authorization (EUA). This EUA will remain  in effect (meaning this test can be used) for the duration of the COVID-19 declaration under Se ction 564(b)(1) of the Act, 21 U.S.C. section 360bbb-3(b)(1), unless the authorization is terminated or revoked sooner.  Performed at [facility], [place] [address], [place], [place] [address]      Labs: Basic Metabolic Panel: Recent Labs  Lab 11/30/20 0446  NA 138  K 3.9  CL 103  CO2 29  GLUCOSE 92  BUN 15  CREATININE 0.77  CALCIUM 7.9*    Liver Function Tests: Recent Labs  Lab 11/30/20 0446  AST 38  ALT 16  ALKPHOS 49  BILITOT 0.5  PROT 5.0*  ALBUMIN 1.9*    No results for input(s): LIPASE, AMYLASE in the last 168 hours. No results for input(s): AMMONIA in the last 168 hours. CBC: No results for input(s): WBC, NEUTROABS, HGB, HCT, MCV, PLT in the last 168 hours.  Cardiac Enzymes: No results for input(s): CKTOTAL, CKMB, CKMBINDEX, TROPONINI in the last 168 hours. BNP: BNP (last 3 results) Recent Labs    07/24/20 0411 07/25/20 0501 07/26/20 0622  BNP 105.0* 123.0* 71.0     ProBNP (last 3 results) No results for input(s): PROBNP in the last 8760 hours.  CBG: Recent Labs  Lab 12/05/20 0633 12/05/20 1133 12/05/20 1655 12/05/20 2045 12/06/20 0658  GLUCAP 80 105* 161* 117* 82        Signed:  Little Ishikawa DO   [facility] 12/06/2020, 7:56 AM

## 2020-12-06 NOTE — TOC Progression Note (Signed)
Transition of Care [facility]) - Progression Note    Patient Details  Name: Joel Perez MRN: [id number] Date of Birth: 26-Sep-1945  Transition [facility] CM/SW [place], [place] Phone Number: 12/06/2020, 9:44 AM  Clinical Narrative:    CSW contacted the facility to confirm DC today. Debbie at [facility] noted they had a pt test covid + last night and need to push the DC until Monday to ensure proper quarantine and safety. CSW updated MD, nurse, and family. SW will continue to follow for DC.     Barriers to Discharge: Continued Medical Work up  Expected Discharge Plan and Services     Discharge Planning Services: CM Consult   Living arrangements for the past 2 months: Single Family Home Expected Discharge Date: 12/06/20                                     Social Determinants of Health (SDOH) Interventions    Readmission Risk Interventions No flowsheet data found.

## 2020-12-07 DIAGNOSIS — B9561 Methicillin susceptible Staphylococcus aureus infection as the cause of diseases classified elsewhere: Secondary | ICD-10-CM | POA: Diagnosis not present

## 2020-12-07 DIAGNOSIS — R7881 Bacteremia: Secondary | ICD-10-CM | POA: Diagnosis not present

## 2020-12-07 LAB — CREATININE, SERUM
Creatinine, Ser: 0.76 mg/dL (ref 0.61–1.24)
GFR, Estimated: >60 mL/min (ref >60)

## 2020-12-07 LAB — GLUCOSE, CAPILLARY
Glucose-Capillary: 82 mg/dL (ref 70–99)
Glucose-Capillary: 135 mg/dL — ABNORMAL HIGH (ref 70–99)
Glucose-Capillary: 146 mg/dL — ABNORMAL HIGH (ref 70–99)
Glucose-Capillary: 163 mg/dL — ABNORMAL HIGH (ref 70–99)

## 2020-12-07 MED ORDER — GABAPENTIN 300 MG PO CAPS
300.0000 mg | ORAL_CAPSULE | Freq: Two times a day (BID) | ORAL | Status: DC
  Administered 2020-12-08 – 2020-12-09 (×3): 300 mg via ORAL
  Filled 2020-12-07 (×3): qty 1

## 2020-12-07 NOTE — Progress Notes (Signed)
Called into patient's room for 17 beats of V-tach assessment.IV TEAM is working with his Picc line.Patient is asymptomatic.

## 2020-12-07 NOTE — Discharge Summary (Signed)
Physician Discharge Summary  Joel Perez:[id number] DOB: Mar 31, 1946 DOA: 11/23/2020  PCP: [name], MD  Admit date: 11/23/2020 Discharge date: 12/07/2020  Time spent: 37 minutes  Patient remains medically stable and agreeable for discharge to SNF - facility is reporting a covid outbreak and unable to accept the patient until Monday.  No acute issues/events overnight.  Recommendations for Outpatient Follow-up:  Requires outpatient amputation management/postop care as per vascular surgeon Dr. Vanessa Barbara to remain in wound until 12/27/2020, weightbearing status deferred to surgeon's but probably nonweightbearing Continue Ancef as per OPAT orders in the outpatient setting for at least 6 weeks ending on 01/09/76-[facility] physician Dr. Juleen China to follow-up in the outpatient setting regarding extension of this versus not Needs CBC Chem-12 1 week in outpatient setting Needs outpatient follow-up with Dr. [name] (pulmonology) regarding tapering of prednisone and follow-up right upper lobe 1.5 spiculated mass-I will CC him for this to be arranged Note dosage changes of various medications as per [facility] Will need outpatient discussion regarding prediabetes and sugars in addition to BMI 32 although suspect will be difficult with amputation for significant physical activity at this time Requires oxygen 4 L at all times given advanced COPD  Discharge Diagnoses:  Nonhealing right fifth toe resulting in R BKA 11/25/2020 MSSA bacteremia still present-extension of course of antibiotics till 8/18 - PICC line placed 7/10 to be removed at the end of therapy or as per infectious disease physician Dr. Juleen China  Please see below for itemized issues addressed in Hops[facility]- refer to other progress notes for clarity if needed  Discharge Condition: Fair  Diet recommendation: Heart healthy low-salt-consider diabetic diet in the outpatient setting  Filed Weights   12/01/20 2134 12/03/20 2055 12/07/20 0700  Weight:  107.4 kg 107.4 kg 100.4 kg    History of present illness:   MSSA s/p R sided BKA 7/5 PM Blood culture X2 from 7/3 confirmed staph-surveillance cultures 7/5 neg Echocardiogram 7/6 no valvular vegetations--- TEE contemplated but deferred secondary to high oxygen requirement and risk of procedure therefore ID rec's 6 wk Abx ANCEF ending 01/08/21--PICC  placed 7/10-please remove PICC line once no longer required Oxy IR 5-10 every 4 --Gabapentin 300 twice daily for nerve pain, continue Flexeril 10 at bedtime in addition Stable for DC to skilled facility when bed available RLL infiltrate?  Postobstructive CXR 7/7 possible PNA-[unlikely--no sputum, no cough worse than prior]--completed 7/11-Ancef as above RUL 1.5 spiculated mass Severe COPD on 4 L of oxygen Outpatient follow-up Dr. Molli Posey will CC Dr. [name] on this discharge summary for care coordination regarding mass, COPD etc. Attempt in the outpatient setting to de-escalate steroids and will CC Dr. [name] to coordinate this Recent COVID-19 infection Status post treatment --curently asymptomatic, completed CDC guided quarantine HFpEF EF 60-65% Losartan resumed at 25 mg dose-May need to titrate in the outpatient Lasix 20 on hold Revatio 20 on hold till Op re-evl DM type 2, questionable history Lab Results  Component Value Date   HGBA1C 6.5 (H) 11/23/2020   Obesity BMI 32 Not on any prior to admission meds-sugars trending below 180 Discontinue sliding scale   Procedures: BKA as above  Consultations: Vascular surgery  Discharge Exam: Vitals:   12/07/20 0559 12/07/20 1041  BP: 137/73 (!) 110/54  Pulse: 85 90  Resp: 17 16  Temp: 98.1 F (36.7 C) 98 F (36.7 C)  SpO2: 97% 98%    Subj on day of d/c   Well no issues On oxygen  General Exam on discharge  No  distress  EOMI NCAT no focal deficit Moving limbs Chest is clear  No mass no swelling no rash Picture of wound from 7/10 included as below; bandage clean/dry/intact  today    Discharge Instructions   Discharge Instructions     [facility] pharmacist to adjust dose for Vancomycin, Aminoglycosides and other anti-infective therapies as requested by physician.   Complete by: As directed    [facility] to provide Cath Flo 2mg    Complete by: As directed    Administer for PICC line occlusion and as ordered by physician for other access device issues.   Anaphylaxis Kit: Provided to treat any anaphylactic reaction to the medication being provided to the patient if First Dose or when requested by physician   Complete by: As directed    Epinephrine 1mg /ml vial / amp: Administer 0.3mg  (0.59ml) subcutaneously once for moderate to severe anaphylaxis, nurse to call physician and pharmacy when reaction occurs and call 911 if needed for immediate care   Diphenhydramine 50mg /ml IV vial: Administer 25-50mg  IV/IM PRN for first dose reaction, rash, itching, mild reaction, nurse to call physician and pharmacy when reaction occurs   Sodium Chloride 0.9% NS 59ml IV: Administer if needed for hypovolemic blood pressure drop or as ordered by physician after call to physician with anaphylactic reaction   Call MD for:  redness, tenderness, or signs of infection (pain, swelling, redness, odor or green/yellow discharge around incision site)   Complete by: As directed    Call MD for:  severe uncontrolled pain   Complete by: As directed    Call MD for:  temperature >100.4   Complete by: As directed    Change dressing on IV access line weekly and PRN   Complete by: As directed    Diet - low sodium heart healthy   Complete by: As directed    Discharge wound care:   Complete by: As directed    As above   Discharge wound care:   Complete by: As directed    Wound Care: Every morning      Comments: Xeroform gauze over right bKA incision.  Cover with 4 x 4 and kerlix then compression sock over this.  Please change once daily   Wound care  Daily      Comments:  Cleanse wounds on the upper, posterior thighs with soap and water, pat dry. Place foam dressings over the wounds.  Perform daily and prn dislodgement or soiling   Flush IV access with Sodium Chloride 0.9% and Heparin 10 units/ml or 100 units/ml   Complete by: As directed    Home infusion instructions - [facility]   Complete by: As directed    Instructions: Flush IV access with Sodium Chloride 0.9% and Heparin 10units/ml or 100units/ml   Change dressing on IV access line: Weekly and PRN   Instructions Cath Flo 2mg : Administer for PICC Line occlusion and as ordered by physician for other access device   [facility] pharmacist to adjust dose for: Vancomycin, Aminoglycosides and other anti-infective therapies as requested by physician   Increase activity slowly   Complete by: As directed    Increase activity slowly   Complete by: As directed    Method of administration may be changed at the discretion of home infusion pharmacist based upon assessment of the patient and/or caregiver's ability to self-administer the medication ordered   Complete by: As directed       Allergies as of 12/07/2020  Reactions   Other Swelling, Other (See Comments)   Farmed Fish (tightness in throat & lip swelling)   Codeine Rash   Sulfa Antibiotics Hives        Medication List     STOP taking these medications    loperamide 2 MG capsule Commonly known as: IMODIUM   oxyCODONE-acetaminophen 10-325 MG tablet Commonly known as: PERCOCET   saccharomyces boulardii 250 MG capsule Commonly known as: FLORASTOR   sildenafil 20 MG tablet Commonly known as: REVATIO       TAKE these medications    acetaminophen 325 MG tablet Commonly known as: TYLENOL Take 2 tablets (650 mg total) by mouth every 6 (six) hours.   albuterol (2.5 MG/3ML) 0.083% nebulizer solution Commonly known as: PROVENTIL USE 1 VIAL IN NEBULIZER EVERY 6 HOURS AS NEEDED FOR WHEEZING AND FOR SHORTNESS OF  BREATH What changed: See the new instructions.   ProAir HFA 108 (90 Base) MCG/ACT inhaler Generic drug: albuterol INHALE 2 PUFFS BY MOUTH EVERY 6 HOURS AS NEEDED FOR WHEEZING FOR SHORTNESS OF BREATH What changed: See the new instructions.   aspirin EC 81 MG tablet Take 81 mg by mouth daily. Swallow whole.   atorvastatin 40 MG tablet Commonly known as: LIPITOR TAKE 1 TABLET BY MOUTH ONCE DAILY . APPOINTMENT REQUIRED FOR FUTURE REFILLS What changed: See the new instructions.   Breztri Aerosphere 160-9-4.8 MCG/ACT Aero Generic drug: Budeson-Glycopyrrol-Formoterol Take 2 puffs first thing in am and then another 2 puffs about 12 hours later. What changed:  how much to take how to take this when to take this additional instructions   ceFAZolin  IVPB Commonly known as: ANCEF Inject 2 g into the vein every 8 (eight) hours. Indication:  osteomyelitis and recurrent MSSA bacteremia First Dose: Yes Last Day of Therapy:  01/08/2021 Labs - Once weekly:  CBC/D and BMP, Labs - Every other week:  ESR and CRP Method of administration: IV Push Method of administration may be changed at the discretion of home infusion pharmacist based upon assessment of the patient and/or caregiver's ability to self-administer the medication ordered.   cyclobenzaprine 10 MG tablet Commonly known as: FLEXERIL Take 10 mg by mouth at bedtime.   furosemide 20 MG tablet Commonly known as: LASIX TAKE 1 TABLET BY MOUTH ONCE DAILY What changed:  how much to take how to take this when to take this additional instructions   furosemide 20 MG tablet Commonly known as: LASIX Take 1 tablet (20 mg total) by mouth daily. What changed: Another medication with the same name was changed. Make sure you understand how and when to take each.   gabapentin 600 MG tablet Commonly known as: NEURONTIN Take 0.5 tablets (300 mg total) by mouth 2 (two) times daily. What changed:  how much to take when to take this    losartan 25 MG tablet Commonly known as: COZAAR Take 1 tablet (25 mg total) by mouth daily. What changed:  medication strength how much to take   Melatonin 10 MG Tabs Take 1 tablet by mouth at bedtime.   multivitamin with minerals Tabs tablet Take 1 tablet by mouth daily.   oxyCODONE 5 MG immediate release tablet Commonly known as: Oxy IR/ROXICODONE Take 1-2 tablets (5-10 mg total) by mouth every 4 (four) hours as needed for severe pain.   pantoprazole 40 MG tablet Commonly known as: PROTONIX Take 1 tablet (40 mg total) by mouth daily.   polyethylene glycol 17 g packet Commonly known as: MIRALAX / GLYCOLAX Take  17 g by mouth daily as needed for mild constipation.   predniSONE 10 MG tablet Commonly known as: DELTASONE 2 until better then one daily What changed: when to take this   sodium chloride 0.65 % nasal spray Commonly known as: OCEAN Place 1 spray into the nose daily as needed for congestion.   vitamin B-12 1000 MCG tablet Commonly known as: CYANOCOBALAMIN Take 1,000 mcg by mouth daily.   vitamin C 500 MG tablet Commonly known as: ASCORBIC ACID Take 500 mg by mouth daily.               Discharge Care Instructions  (From admission, onward)           Start     Ordered   12/06/20 0000  Discharge wound care:       Comments: Wound Care: Every morning      Comments: Xeroform gauze over right bKA incision.  Cover with 4 x 4 and kerlix then compression sock over this.  Please change once daily   Wound care  Daily      Comments: Cleanse wounds on the upper, posterior thighs with soap and water, pat dry. Place foam dressings over the wounds.  Perform daily and prn dislodgement or soiling   12/06/20 0755   12/01/20 0000  Change dressing on IV access line weekly and PRN  (Home infusion instructions - [facility] )        12/01/20 1331   12/01/20 0000  Discharge wound care:       Comments: As above   12/01/20 1331           Allergies   Allergen Reactions   Other Swelling and Other (See Comments)    Farmed Fish (tightness in throat & lip swelling)   Codeine Rash   Sulfa Antibiotics Hives    Follow-up Information     [facility] Follow up in 1 month(s).   Why: The office will call the patient with an appointment Contact information: [address] [place], [place], DO Follow up.   Specialties: Infectious Diseases, Internal Medicine Why: 12/26/20 at 9am. Please call to schedule if you are not able to make this appointment. Contact information: [place] [address] [phone]                  The results of significant diagnostics from this hospitalization (including imaging, microbiology, ancillary and laboratory) are listed below for reference.    Significant Diagnostic Studies: DG Chest 1 View  Result Date: 11/23/2020 CLINICAL DATA:  Concern for osteomyelitis of the RIGHT foot. Recent partial RIGHT foot amputation. Weakness. EXAM: CHEST  1 VIEW COMPARISON:  07/22/2020 FINDINGS: Stable cardiomegaly. Patchy opacity is identified in the RIGHT lung base, consistent with infectious infiltrate. There is no pulmonary edema. Within the RIGHT UPPER lobe, a 1.5 centimeter spiculated mass and adjacent satellite nodule are again noted. IMPRESSION: 1. RIGHT LOWER lobe infiltrates. 2. RIGHT UPPER lobe masses, as seen on prior exams. Electronically Signed   By: Nolon Nations M.D.   On: 11/23/2020 14:35   DG Chest 2 View  Result Date: 11/27/2020 CLINICAL DATA:  Pneumonia follow-up EXAM: CHEST - 2 VIEW COMPARISON:  11/23/2020 FINDINGS: Persistent opacity at the right lung base. Nodular opacities are identified in the right lung. Increased density at the left lung base. Small bilateral pleural effusions. Similar cardiomediastinal contours. No  pneumothorax. IMPRESSION: Persistent opacities at the right lung base and right  lung nodules. Increased left basilar atelectasis/consolidation. Small bilateral pleural effusions. Electronically Signed   By: Macy Mis M.D.   On: 11/27/2020 09:08   MR FOOT RIGHT WO CONTRAST  Result Date: 11/25/2020 CLINICAL DATA:  History of partial fifth ray amputation 08/28/2020. Pain, swelling and open wound with hernial and discharge for 2 days. EXAM: MRI OF THE RIGHT FOREFOOT WITHOUT CONTRAST TECHNIQUE: Multiplanar, multisequence MR imaging of the right foot was performed. No intravenous contrast was administered. COMPARISON:  Radiograph 11/23/2020 FINDINGS: Extensive and advanced changes of osteomyelitis involving the residual fifth metatarsal which is destroyed. Adjacent fluid collection measures 4.5 cm and is highly suspicious for an abscess. There is also an open wound in this area. There is severe diffuse cellulitis and myofasciitis. The other bony structures are intact. IMPRESSION: 1. Extensive and advanced changes of osteomyelitis involving the residual fifth metatarsal. 2. Adjacent 4.5 cm fluid collection highly suspicious for an abscess. 3. Severe diffuse cellulitis and myofasciitis. Electronically Signed   By: Marijo Sanes M.D.   On: 11/25/2020 07:55   DG Foot Complete Right  Result Date: 11/23/2020 CLINICAL DATA:  Recent partial amputation, concern for osteomyelitis. EXAM: RIGHT FOOT COMPLETE - 3+ VIEW COMPARISON:  08/25/2020 FINDINGS: Prior amputation of the small toe. Recognizable 3.1 cm proximal portion of the fifth metatarsal noted, lung distal margin of this there is a 3.8 cm region of amorphous hazy calcification suspicious for a destroyed bony fragment from osteomyelitis, less likely to be heterotopic ossification along the prior amputation. No appreciable destructive findings along the remaining metatarsals or phalanges. Mildly exaggerated longitudinal arch of the foot on the lateral projection which is not obtained with the patient standing. Extensive soft tissue swelling  along the forefoot especially dorsally. Irregularity of the cutaneous surface along the amputation site and lateral to the fifth metatarsal. Small Achilles calcaneal spur. IMPRESSION: 1. 3.8 cm region of amorphous hazy calcification corresponding to the shaft of the fifth metatarsal, probably bony destruction from active osteomyelitis and less likely to from heterotopic ossification along the site of prior amputation. There is a 3.1 cm fragment of proximal fifth metatarsal which appears recognizable, proximal to this hazy calcification. Soft tissue swelling and irregularity of the overlying soft tissues. Electronically Signed   By: Van Clines M.D.   On: 11/23/2020 15:10   ECHOCARDIOGRAM COMPLETE  Result Date: 11/26/2020    ECHOCARDIOGRAM REPORT   Patient Name:   [name] Date of Exam: 11/26/2020 Medical Rec #:  [id number]     Height:       70.0 in Accession #:    [id number]    Weight:       227.7 lb Date of Birth:  [birth year]     BSA:          2.206 m Patient Age:    35 years      BP:           135/63 mmHg Patient Gender: M             HR:           92 bpm. Exam Location:  Inpatient Procedure: 2D Echo, Cardiac Doppler and Color Doppler Indications:    bacteremia  History:        Patient has prior history of Echocardiogram examinations, most                 recent 07/22/2020. Risk Factors:Former Smoker.  Sonographer:  Cammy Brochure Referring Phys: [id number] Marcus  1. Left ventricular ejection fraction, by estimation, is 60 to 65%. The left ventricle has normal function. The left ventricle has no regional wall motion abnormalities. There is mild left ventricular hypertrophy. Left ventricular diastolic parameters were normal.  2. Right ventricular systolic function is normal. The right ventricular size is normal.  3. The mitral valve is normal in structure. Trivial mitral valve regurgitation. No evidence of mitral stenosis.  4. The aortic valve is calcified. There is moderate  calcification of the aortic valve. There is moderate thickening of the aortic valve. Aortic valve regurgitation is trivial. Mild aortic valve stenosis.  5. The inferior vena cava is normal in size with greater than 50% respiratory variability, suggesting right atrial pressure of 3 mmHg. Comparison(s): No significant change from prior study. Conclusion(s)/Recommendation(s): No evidence of valvular vegetations on this transthoracic echocardiogram. Would recommend a transesophageal echocardiogram to exclude infective endocarditis if clinically indicated. FINDINGS  Left Ventricle: Left ventricular ejection fraction, by estimation, is 60 to 65%. The left ventricle has normal function. The left ventricle has no regional wall motion abnormalities. The left ventricular internal cavity size was normal in size. There is  mild left ventricular hypertrophy. Left ventricular diastolic parameters were normal. Right Ventricle: The right ventricular size is normal. No increase in right ventricular wall thickness. Right ventricular systolic function is normal. Left Atrium: Left atrial size was normal in size. Right Atrium: Right atrial size was normal in size. Pericardium: There is no evidence of pericardial effusion. Mitral Valve: The mitral valve is normal in structure. Trivial mitral valve regurgitation. No evidence of mitral valve stenosis. Tricuspid Valve: The tricuspid valve is normal in structure. Tricuspid valve regurgitation is trivial. No evidence of tricuspid stenosis. Aortic Valve: The aortic valve is calcified. There is moderate calcification of the aortic valve. There is moderate thickening of the aortic valve. Aortic valve regurgitation is trivial. Mild aortic stenosis is present. Aortic valve mean gradient measures 5.7 mmHg. Aortic valve peak gradient measures 10.2 mmHg. Aortic valve area, by VTI measures 2.80 cm. Pulmonic Valve: The pulmonic valve was not well visualized. Pulmonic valve regurgitation is not  visualized. Aorta: The aortic root and ascending aorta are structurally normal, with no evidence of dilitation. Venous: The inferior vena cava is normal in size with greater than 50% respiratory variability, suggesting right atrial pressure of 3 mmHg. IAS/Shunts: The atrial septum is grossly normal.  LEFT VENTRICLE PLAX 2D LVIDd:         4.50 cm LVIDs:         3.00 cm LV PW:         1.40 cm LV IVS:        0.90 cm LVOT diam:     2.10 cm LV SV:         75 LV SV Index:   34 LVOT Area:     3.46 cm  RIGHT VENTRICLE          IVC RV Basal diam:  3.50 cm  IVC diam: 0.70 cm LEFT ATRIUM             Index       RIGHT ATRIUM           Index LA diam:        3.00 cm 1.36 cm/m  RA Area:     11.50 cm LA Vol (A2C):   54.9 ml 24.89 ml/m RA Volume:   23.70 ml  10.74 ml/m LA  Vol (A4C):   50.9 ml 23.08 ml/m LA Biplane Vol: 54.3 ml 24.62 ml/m  AORTIC VALVE AV Area (Vmax):    2.86 cm AV Area (Vmean):   2.82 cm AV Area (VTI):     2.80 cm AV Vmax:           159.33 cm/s AV Vmean:          110.000 cm/s AV VTI:            0.269 m AV Peak Grad:      10.2 mmHg AV Mean Grad:      5.7 mmHg LVOT Vmax:         131.67 cm/s LVOT Vmean:        89.567 cm/s LVOT VTI:          0.217 m LVOT/AV VTI ratio: 0.81  AORTA Ao Root diam: 3.30 cm Ao Asc diam:  2.90 cm  SHUNTS Systemic VTI:  0.22 m Systemic Diam: 2.10 cm Buford Dresser MD Electronically signed by Buford Dresser MD Signature Date/Time: 11/26/2020/2:45:08 PM    Final    [place] EKG Site Rite  Result Date: 11/28/2020 If Site Rite image not attached, placement could not be confirmed due to current cardiac rhythm.   Microbiology: Recent Results (from the past 240 hour(s))  SARS CORONAVIRUS 2 (TAT 6-24 HRS) Nasopharyngeal Nasopharyngeal Swab     Status: None   Collection Time: 12/01/20  2:40 PM   Specimen: Nasopharyngeal Swab  Result Value Ref Range Status   SARS Coronavirus 2 NEGATIVE NEGATIVE Final    Comment: (NOTE) SARS-CoV-2 target nucleic acids are NOT DETECTED.  The  SARS-CoV-2 RNA is generally detectable in upper and lower respiratory specimens during the acute phase of infection. Negative results do not preclude SARS-CoV-2 infection, do not rule out co-infections with other pathogens, and should not be used as the sole basis for treatment or other patient management decisions. Negative results must be combined with clinical observations, patient history, and epidemiological information. The expected result is Negative.  Fact Sheet for Patients: SugarRoll.be  Fact Sheet for Healthcare Providers: https://www.woods-mathews.com/  This test is not yet approved or cleared by the [place] FDA and  has been authorized for detection and/or diagnosis of SARS-CoV-2 by FDA under an Emergency Use Authorization (EUA). This EUA will remain  in effect (meaning this test can be used) for the duration of the COVID-19 declaration under Se ction 564(b)(1) of the Act, 21 U.S.C. section 360bbb-3(b)(1), unless the authorization is terminated or revoked sooner.  Performed at [facility], [place] [address], [place], [place] [address]   SARS CORONAVIRUS 2 (TAT 6-24 HRS) Nasopharyngeal Nasopharyngeal Swab     Status: None   Collection Time: 12/05/20  3:10 PM   Specimen: Nasopharyngeal Swab  Result Value Ref Range Status   SARS Coronavirus 2 NEGATIVE NEGATIVE Final    Comment: (NOTE) SARS-CoV-2 target nucleic acids are NOT DETECTED.  The SARS-CoV-2 RNA is generally detectable in upper and lower respiratory specimens during the acute phase of infection. Negative results do not preclude SARS-CoV-2 infection, do not rule out co-infections with other pathogens, and should not be used as the sole basis for treatment or other patient management decisions. Negative results must be combined with clinical observations, patient history, and epidemiological information. The expected result is Negative.  Fact Sheet for  Patients: SugarRoll.be  Fact Sheet for Healthcare Providers: https://www.woods-mathews.com/  This test is not yet approved or cleared by the [place] FDA and  has  been authorized for detection and/or diagnosis of SARS-CoV-2 by FDA under an Emergency Use Authorization (EUA). This EUA will remain  in effect (meaning this test can be used) for the duration of the COVID-19 declaration under Se ction 564(b)(1) of the Act, 21 U.S.C. section 360bbb-3(b)(1), unless the authorization is terminated or revoked sooner.  Performed at [facility], [place] [address] [place], [place] [address]      Labs: Basic Metabolic Panel: No results for input(s): NA, K, CL, CO2, GLUCOSE, BUN, CREATININE, CALCIUM, MG, PHOS in the last 168 hours.  Liver Function Tests: No results for input(s): AST, ALT, ALKPHOS, BILITOT, PROT, ALBUMIN in the last 168 hours.  No results for input(s): LIPASE, AMYLASE in the last 168 hours. No results for input(s): AMMONIA in the last 168 hours. CBC: No results for input(s): WBC, NEUTROABS, HGB, HCT, MCV, PLT in the last 168 hours.  Cardiac Enzymes: No results for input(s): CKTOTAL, CKMB, CKMBINDEX, TROPONINI in the last 168 hours. BNP: BNP (last 3 results) Recent Labs    07/24/20 0411 07/25/20 0501 07/26/20 0622  BNP 105.0* 123.0* 71.0     ProBNP (last 3 results) No results for input(s): PROBNP in the last 8760 hours.  CBG: Recent Labs  Lab 12/06/20 0658 12/06/20 1151 12/06/20 2058 12/07/20 0651 12/07/20 1123  GLUCAP 82 149* 160* 82 135*        Signed:  Little Ishikawa DO   [facility] 12/07/2020, 2:55 PM

## 2020-12-08 DIAGNOSIS — R7881 Bacteremia: Secondary | ICD-10-CM | POA: Diagnosis not present

## 2020-12-08 DIAGNOSIS — B9561 Methicillin susceptible Staphylococcus aureus infection as the cause of diseases classified elsewhere: Secondary | ICD-10-CM | POA: Diagnosis not present

## 2020-12-08 LAB — GLUCOSE, CAPILLARY
Glucose-Capillary: 74 mg/dL (ref 70–99)
Glucose-Capillary: 120 mg/dL — ABNORMAL HIGH (ref 70–99)
Glucose-Capillary: 202 mg/dL — ABNORMAL HIGH (ref 70–99)
Glucose-Capillary: 138 mg/dL — ABNORMAL HIGH (ref 70–99)

## 2020-12-08 LAB — SARS CORONAVIRUS 2 (TAT 6-24 HRS): SARS Coronavirus 2: NEGATIVE

## 2020-12-08 MED ORDER — HEPARIN SOD (PORK) LOCK FLUSH 100 UNIT/ML IV SOLN
250.0000 [IU] | INTRAVENOUS | Status: AC | PRN
  Administered 2020-12-09: 250 [IU]
  Filled 2020-12-08: qty 2.5

## 2020-12-08 NOTE — Care Management Important Message (Signed)
Important Message  Patient Details  Name: Joel Perez MRN: [id number] Date of Birth: 24-Mar-1946   Medicare Important Message Given:  Yes - Important Message mailed due to current National Emergency  Verbal consent obtained due to current National Emergency  Relationship to patient: Self Contact Name: Valentine Call Date: 12/08/20  Time: 1333 Phone: [phone] Outcome: Spoke with contact Important Message mailed to: Patient address on file    [name] 12/08/2020, 1:33 PM

## 2020-12-08 NOTE — Discharge Summary (Signed)
Physician Discharge Summary  Joel Perez CBJ:[id number] DOB: [birth year] DOA: 11/23/2020  PCP: [name], MD  Admit date: 11/23/2020 Discharge date: 12/08/2020  Time spent: 37 minutes  Patient remains medically stable and agreeable for discharge to SNF - facility is reporting a covid outbreak - supposedly able to accept the patient today.  No acute issues/events overnight.  Recommendations for Outpatient Follow-up:  Requires outpatient amputation management/postop care as per vascular surgeon Dr. Vanessa Barbara to remain in wound until 12/27/2020, weightbearing status deferred to surgeon's but probably nonweightbearing Continue Ancef as per OPAT orders in the outpatient setting for at least 6 weeks ending on 01/09/2019-[facility] physician Dr. Juleen China to follow-up in the outpatient setting regarding extension of this versus not Needs CBC Chem-12 1 week in outpatient setting Needs outpatient follow-up with Dr. [name] (pulmonology) regarding tapering of prednisone and follow-up right upper lobe 1.5 spiculated mass-I will CC him for this to be arranged Note dosage changes of various medications as per [facility] ([place]) Will need outpatient discussion regarding prediabetes and sugars in addition to BMI 32 although suspect will be difficult with amputation for significant physical activity at this time Requires oxygen 4 L at all times given advanced COPD  Discharge Diagnoses:  Nonhealing right fifth toe resulting in R BKA 11/25/2020 MSSA bacteremia still present-extension of course of antibiotics till 8/18 - PICC line placed 7/10 to be removed at the end of therapy or as per infectious disease physician Dr. Juleen China  Please see below for itemized issues addressed in Hopsital- refer to other progress notes for clarity if needed  Discharge Condition: Fair  Diet recommendation: Heart healthy low-salt-consider diabetic diet in the outpatient setting  Filed Weights   12/03/20 2055 12/07/20 0700 12/08/20 1607  Weight:  107.4 kg 100.4 kg 101.2 kg    History of present illness:   MSSA s/p R sided BKA 7/5 PM Blood culture X2 from 7/3 confirmed staph-surveillance cultures 7/5 neg Echocardiogram 7/6 no valvular vegetations--- TEE contemplated but deferred secondary to high oxygen requirement and risk of procedure therefore ID rec's 6 wk Abx ANCEF ending 01/08/21--PICC  placed 7/10-please remove PICC line once no longer required Oxy IR 5-10 every 4 --Gabapentin 300 twice daily for nerve pain, continue Flexeril 10 at bedtime in addition Stable for DC to skilled facility when bed available RLL infiltrate?  Postobstructive CXR 7/7 possible PNA-[unlikely--no sputum, no cough worse than prior]--completed 7/11-Ancef as above RUL 1.5 spiculated mass Severe COPD on 4 L of oxygen Outpatient follow-up Dr. Molli Posey will CC Dr. [name] on this discharge summary for care coordination regarding mass, COPD etc. Attempt in the outpatient setting to de-escalate steroids and will CC Dr. [name] to coordinate this Recent COVID-19 infection Status post treatment --curently asymptomatic, completed CDC guided quarantine HFpEF EF 60-65% Losartan resumed at 25 mg dose-May need to titrate in the outpatient Lasix 20 on hold Revatio 20 on hold till Op re-evl DM type 2, questionable history Lab Results  Component Value Date   HGBA1C 6.5 (H) 11/23/2020   Obesity BMI 32 Not on any prior to admission meds-sugars trending below 180 Discontinue sliding scale   Procedures: BKA as above  Consultations: Vascular surgery  Discharge Exam: Vitals:   12/07/20 2123 12/08/20 0611  BP: 124/67 109/75  Pulse: 90 96  Resp: 18 18  Temp: 97.7 F (36.5 C) 98 F (36.7 C)  SpO2: 99% 99%    Subj on day of d/c   Well no issues On oxygen  General Exam on discharge  No distress  EOMI NCAT no focal deficit Moving limbs Chest is clear  No mass no swelling no rash Picture of wound from 7/10 included as below; bandage clean/dry/intact  today    Discharge Instructions   Discharge Instructions     [facility] pharmacist to adjust dose for Vancomycin, Aminoglycosides and other anti-infective therapies as requested by physician.   Complete by: As directed    [facility] to provide Cath Flo 62m   Complete by: As directed    Administer for PICC line occlusion and as ordered by physician for other access device issues.   Anaphylaxis Kit: Provided to treat any anaphylactic reaction to the medication being provided to the patient if First Dose or when requested by physician   Complete by: As directed    Epinephrine 1524mml vial / amp: Administer 0.24m65m0.24ml89mubcutaneously once for moderate to severe anaphylaxis, nurse to call physician and pharmacy when reaction occurs and call 911 if needed for immediate care   Diphenhydramine 50mg6mIV vial: Administer 25-50mg 46mM PRN for first dose reaction, rash, itching, mild reaction, nurse to call physician and pharmacy when reaction occurs   Sodium Chloride 0.9% NS 500ml I86mdminister if needed for hypovolemic blood pressure drop or as ordered by physician after call to physician with anaphylactic reaction   Call MD for:  redness, tenderness, or signs of infection (pain, swelling, redness, odor or green/yellow discharge around incision site)   Complete by: As directed    Call MD for:  severe uncontrolled pain   Complete by: As directed    Call MD for:  temperature >100.4   Complete by: As directed    Change dressing on IV access line weekly and PRN   Complete by: As directed    Diet - low sodium heart healthy   Complete by: As directed    Discharge wound care:   Complete by: As directed    As above   Discharge wound care:   Complete by: As directed    Wound Care: Every morning      Comments: Xeroform gauze over right bKA incision.  Cover with 4 x 4 and kerlix then compression sock over this.  Please change once daily   Wound care  Daily      Comments:  Cleanse wounds on the upper, posterior thighs with soap and water, pat dry. Place foam dressings over the wounds.  Perform daily and prn dislodgement or soiling   Flush IV access with Sodium Chloride 0.9% and Heparin 10 units/ml or 100 units/ml   Complete by: As directed    Home infusion instructions - [facility]   Complete by: As directed    Instructions: Flush IV access with Sodium Chloride 0.9% and Heparin 10units/ml or 100units/ml   Change dressing on IV access line: Weekly and PRN   Instructions Cath Flo 2mg: Ad61mister for PICC Line occlusion and as ordered by physician for other access device   [facility] pharmacist to adjust dose for: Vancomycin, Aminoglycosides and other anti-infective therapies as requested by physician   Increase activity slowly   Complete by: As directed    Increase activity slowly   Complete by: As directed    Method of administration may be changed at the discretion of home infusion pharmacist based upon assessment of the patient and/or caregiver's ability to self-administer the medication ordered   Complete by: As directed       Allergies as of 12/08/2020  Reactions   Other Swelling, Other (See Comments)   Farmed Fish (tightness in throat & lip swelling)   Codeine Rash   Sulfa Antibiotics Hives        Medication List     STOP taking these medications    loperamide 2 MG capsule Commonly known as: IMODIUM   oxyCODONE-acetaminophen 10-325 MG tablet Commonly known as: PERCOCET   saccharomyces boulardii 250 MG capsule Commonly known as: FLORASTOR   sildenafil 20 MG tablet Commonly known as: REVATIO       TAKE these medications    acetaminophen 325 MG tablet Commonly known as: TYLENOL Take 2 tablets (650 mg total) by mouth every 6 (six) hours.   albuterol (2.5 MG/3ML) 0.083% nebulizer solution Commonly known as: PROVENTIL USE 1 VIAL IN NEBULIZER EVERY 6 HOURS AS NEEDED FOR WHEEZING AND FOR SHORTNESS OF  BREATH What changed: See the new instructions.   ProAir HFA 108 (90 Base) MCG/ACT inhaler Generic drug: albuterol INHALE 2 PUFFS BY MOUTH EVERY 6 HOURS AS NEEDED FOR WHEEZING FOR SHORTNESS OF BREATH What changed: See the new instructions.   aspirin EC 81 MG tablet Take 81 mg by mouth daily. Swallow whole.   atorvastatin 40 MG tablet Commonly known as: LIPITOR TAKE 1 TABLET BY MOUTH ONCE DAILY . APPOINTMENT REQUIRED FOR FUTURE REFILLS What changed: See the new instructions.   Breztri Aerosphere 160-9-4.8 MCG/ACT Aero Generic drug: Budeson-Glycopyrrol-Formoterol Take 2 puffs first thing in am and then another 2 puffs about 12 hours later. What changed:  how much to take how to take this when to take this additional instructions   ceFAZolin  IVPB Commonly known as: ANCEF Inject 2 g into the vein every 8 (eight) hours. Indication:  osteomyelitis and recurrent MSSA bacteremia First Dose: Yes Last Day of Therapy:  01/08/2021 Labs - Once weekly:  CBC/D and BMP, Labs - Every other week:  ESR and CRP Method of administration: IV Push Method of administration may be changed at the discretion of home infusion pharmacist based upon assessment of the patient and/or caregiver's ability to self-administer the medication ordered.   cyclobenzaprine 10 MG tablet Commonly known as: FLEXERIL Take 10 mg by mouth at bedtime.   furosemide 20 MG tablet Commonly known as: LASIX TAKE 1 TABLET BY MOUTH ONCE DAILY What changed:  how much to take how to take this when to take this additional instructions   furosemide 20 MG tablet Commonly known as: LASIX Take 1 tablet (20 mg total) by mouth daily. What changed: Another medication with the same name was changed. Make sure you understand how and when to take each.   gabapentin 600 MG tablet Commonly known as: NEURONTIN Take 0.5 tablets (300 mg total) by mouth 2 (two) times daily. What changed:  how much to take when to take this    losartan 25 MG tablet Commonly known as: COZAAR Take 1 tablet (25 mg total) by mouth daily. What changed:  medication strength how much to take   Melatonin 10 MG Tabs Take 1 tablet by mouth at bedtime.   multivitamin with minerals Tabs tablet Take 1 tablet by mouth daily.   oxyCODONE 5 MG immediate release tablet Commonly known as: Oxy IR/ROXICODONE Take 1-2 tablets (5-10 mg total) by mouth every 4 (four) hours as needed for severe pain.   pantoprazole 40 MG tablet Commonly known as: PROTONIX Take 1 tablet (40 mg total) by mouth daily.   polyethylene glycol 17 g packet Commonly known as: MIRALAX / GLYCOLAX Take  17 g by mouth daily as needed for mild constipation.   predniSONE 10 MG tablet Commonly known as: DELTASONE 2 until better then one daily What changed: when to take this   sodium chloride 0.65 % nasal spray Commonly known as: OCEAN Place 1 spray into the nose daily as needed for congestion.   vitamin B-12 1000 MCG tablet Commonly known as: CYANOCOBALAMIN Take 1,000 mcg by mouth daily.   vitamin C 500 MG tablet Commonly known as: ASCORBIC ACID Take 500 mg by mouth daily.               Discharge Care Instructions  (From admission, onward)           Start     Ordered   12/06/20 0000  Discharge wound care:       Comments: Wound Care: Every morning      Comments: Xeroform gauze over right bKA incision.  Cover with 4 x 4 and kerlix then compression sock over this.  Please change once daily   Wound care  Daily      Comments: Cleanse wounds on the upper, posterior thighs with soap and water, pat dry. Place foam dressings over the wounds.  Perform daily and prn dislodgement or soiling   12/06/20 0755   12/01/20 0000  Change dressing on IV access line weekly and PRN  (Home infusion instructions - [facility] )        12/01/20 1331   12/01/20 0000  Discharge wound care:       Comments: As above   12/01/20 1331           Allergies   Allergen Reactions   Other Swelling and Other (See Comments)    Farmed Fish (tightness in throat & lip swelling)   Codeine Rash   Sulfa Antibiotics Hives    Follow-up Information     [facility] Follow up in 1 month(s).   Why: The office will call the patient with an appointment Contact information: [address] [place], [place], DO Follow up.   Specialties: Infectious Diseases, Internal Medicine Why: 12/26/20 at 9am. Please call to schedule if you are not able to make this appointment. Contact information: [place] [address] [phone]                  The results of significant diagnostics from this hospitalization (including imaging, microbiology, ancillary and laboratory) are listed below for reference.    Significant Diagnostic Studies: DG Chest 1 View  Result Date: 11/23/2020 CLINICAL DATA:  Concern for osteomyelitis of the RIGHT foot. Recent partial RIGHT foot amputation. Weakness. EXAM: CHEST  1 VIEW COMPARISON:  07/22/2020 FINDINGS: Stable cardiomegaly. Patchy opacity is identified in the RIGHT lung base, consistent with infectious infiltrate. There is no pulmonary edema. Within the RIGHT UPPER lobe, a 1.5 centimeter spiculated mass and adjacent satellite nodule are again noted. IMPRESSION: 1. RIGHT LOWER lobe infiltrates. 2. RIGHT UPPER lobe masses, as seen on prior exams. Electronically Signed   By: Nolon Nations M.D.   On: 11/23/2020 14:35   DG Chest 2 View  Result Date: 11/27/2020 CLINICAL DATA:  Pneumonia follow-up EXAM: CHEST - 2 VIEW COMPARISON:  11/23/2020 FINDINGS: Persistent opacity at the right lung base. Nodular opacities are identified in the right lung. Increased density at the left lung base. Small bilateral pleural effusions. Similar cardiomediastinal contours. No  pneumothorax. IMPRESSION: Persistent opacities at the right lung base and right  lung nodules. Increased left basilar atelectasis/consolidation. Small bilateral pleural effusions. Electronically Signed   By: Macy Mis M.D.   On: 11/27/2020 09:08   MR FOOT RIGHT WO CONTRAST  Result Date: 11/25/2020 CLINICAL DATA:  History of partial fifth ray amputation 08/28/2020. Pain, swelling and open wound with hernial and discharge for 2 days. EXAM: MRI OF THE RIGHT FOREFOOT WITHOUT CONTRAST TECHNIQUE: Multiplanar, multisequence MR imaging of the right foot was performed. No intravenous contrast was administered. COMPARISON:  Radiograph 11/23/2020 FINDINGS: Extensive and advanced changes of osteomyelitis involving the residual fifth metatarsal which is destroyed. Adjacent fluid collection measures 4.5 cm and is highly suspicious for an abscess. There is also an open wound in this area. There is severe diffuse cellulitis and myofasciitis. The other bony structures are intact. IMPRESSION: 1. Extensive and advanced changes of osteomyelitis involving the residual fifth metatarsal. 2. Adjacent 4.5 cm fluid collection highly suspicious for an abscess. 3. Severe diffuse cellulitis and myofasciitis. Electronically Signed   By: Marijo Sanes M.D.   On: 11/25/2020 07:55   DG Foot Complete Right  Result Date: 11/23/2020 CLINICAL DATA:  Recent partial amputation, concern for osteomyelitis. EXAM: RIGHT FOOT COMPLETE - 3+ VIEW COMPARISON:  08/25/2020 FINDINGS: Prior amputation of the small toe. Recognizable 3.1 cm proximal portion of the fifth metatarsal noted, lung distal margin of this there is a 3.8 cm region of amorphous hazy calcification suspicious for a destroyed bony fragment from osteomyelitis, less likely to be heterotopic ossification along the prior amputation. No appreciable destructive findings along the remaining metatarsals or phalanges. Mildly exaggerated longitudinal arch of the foot on the lateral projection which is not obtained with the patient standing. Extensive soft tissue swelling  along the forefoot especially dorsally. Irregularity of the cutaneous surface along the amputation site and lateral to the fifth metatarsal. Small Achilles calcaneal spur. IMPRESSION: 1. 3.8 cm region of amorphous hazy calcification corresponding to the shaft of the fifth metatarsal, probably bony destruction from active osteomyelitis and less likely to from heterotopic ossification along the site of prior amputation. There is a 3.1 cm fragment of proximal fifth metatarsal which appears recognizable, proximal to this hazy calcification. Soft tissue swelling and irregularity of the overlying soft tissues. Electronically Signed   By: Van Clines M.D.   On: 11/23/2020 15:10   ECHOCARDIOGRAM COMPLETE  Result Date: 11/26/2020    ECHOCARDIOGRAM REPORT   Patient Name:   [name] Date of Exam: 11/26/2020 Medical Rec #:  [id number]     Height:       70.0 in Accession #:    [id number]    Weight:       227.7 lb Date of Birth:  [birth year]     BSA:          2.206 m Patient Age:    35 years      BP:           135/63 mmHg Patient Gender: M             HR:           92 bpm. Exam Location:  Inpatient Procedure: 2D Echo, Cardiac Doppler and Color Doppler Indications:    bacteremia  History:        Patient has prior history of Echocardiogram examinations, most                 recent 07/22/2020. Risk Factors:Former Smoker.  Sonographer:  Cammy Brochure Referring Phys: [id number] Marcus  1. Left ventricular ejection fraction, by estimation, is 60 to 65%. The left ventricle has normal function. The left ventricle has no regional wall motion abnormalities. There is mild left ventricular hypertrophy. Left ventricular diastolic parameters were normal.  2. Right ventricular systolic function is normal. The right ventricular size is normal.  3. The mitral valve is normal in structure. Trivial mitral valve regurgitation. No evidence of mitral stenosis.  4. The aortic valve is calcified. There is moderate  calcification of the aortic valve. There is moderate thickening of the aortic valve. Aortic valve regurgitation is trivial. Mild aortic valve stenosis.  5. The inferior vena cava is normal in size with greater than 50% respiratory variability, suggesting right atrial pressure of 3 mmHg. Comparison(s): No significant change from prior study. Conclusion(s)/Recommendation(s): No evidence of valvular vegetations on this transthoracic echocardiogram. Would recommend a transesophageal echocardiogram to exclude infective endocarditis if clinically indicated. FINDINGS  Left Ventricle: Left ventricular ejection fraction, by estimation, is 60 to 65%. The left ventricle has normal function. The left ventricle has no regional wall motion abnormalities. The left ventricular internal cavity size was normal in size. There is  mild left ventricular hypertrophy. Left ventricular diastolic parameters were normal. Right Ventricle: The right ventricular size is normal. No increase in right ventricular wall thickness. Right ventricular systolic function is normal. Left Atrium: Left atrial size was normal in size. Right Atrium: Right atrial size was normal in size. Pericardium: There is no evidence of pericardial effusion. Mitral Valve: The mitral valve is normal in structure. Trivial mitral valve regurgitation. No evidence of mitral valve stenosis. Tricuspid Valve: The tricuspid valve is normal in structure. Tricuspid valve regurgitation is trivial. No evidence of tricuspid stenosis. Aortic Valve: The aortic valve is calcified. There is moderate calcification of the aortic valve. There is moderate thickening of the aortic valve. Aortic valve regurgitation is trivial. Mild aortic stenosis is present. Aortic valve mean gradient measures 5.7 mmHg. Aortic valve peak gradient measures 10.2 mmHg. Aortic valve area, by VTI measures 2.80 cm. Pulmonic Valve: The pulmonic valve was not well visualized. Pulmonic valve regurgitation is not  visualized. Aorta: The aortic root and ascending aorta are structurally normal, with no evidence of dilitation. Venous: The inferior vena cava is normal in size with greater than 50% respiratory variability, suggesting right atrial pressure of 3 mmHg. IAS/Shunts: The atrial septum is grossly normal.  LEFT VENTRICLE PLAX 2D LVIDd:         4.50 cm LVIDs:         3.00 cm LV PW:         1.40 cm LV IVS:        0.90 cm LVOT diam:     2.10 cm LV SV:         75 LV SV Index:   34 LVOT Area:     3.46 cm  RIGHT VENTRICLE          IVC RV Basal diam:  3.50 cm  IVC diam: 0.70 cm LEFT ATRIUM             Index       RIGHT ATRIUM           Index LA diam:        3.00 cm 1.36 cm/m  RA Area:     11.50 cm LA Vol (A2C):   54.9 ml 24.89 ml/m RA Volume:   23.70 ml  10.74 ml/m LA  Vol (A4C):   50.9 ml 23.08 ml/m LA Biplane Vol: 54.3 ml 24.62 ml/m  AORTIC VALVE AV Area (Vmax):    2.86 cm AV Area (Vmean):   2.82 cm AV Area (VTI):     2.80 cm AV Vmax:           159.33 cm/s AV Vmean:          110.000 cm/s AV VTI:            0.269 m AV Peak Grad:      10.2 mmHg AV Mean Grad:      5.7 mmHg LVOT Vmax:         131.67 cm/s LVOT Vmean:        89.567 cm/s LVOT VTI:          0.217 m LVOT/AV VTI ratio: 0.81  AORTA Ao Root diam: 3.30 cm Ao Asc diam:  2.90 cm  SHUNTS Systemic VTI:  0.22 m Systemic Diam: 2.10 cm Buford Dresser MD Electronically signed by Buford Dresser MD Signature Date/Time: 11/26/2020/2:45:08 PM    Final    [place] EKG Site Rite  Result Date: 11/28/2020 If Site Rite image not attached, placement could not be confirmed due to current cardiac rhythm.   Microbiology: Recent Results (from the past 240 hour(s))  SARS CORONAVIRUS 2 (TAT 6-24 HRS) Nasopharyngeal Nasopharyngeal Swab     Status: None   Collection Time: 12/01/20  2:40 PM   Specimen: Nasopharyngeal Swab  Result Value Ref Range Status   SARS Coronavirus 2 NEGATIVE NEGATIVE Final    Comment: (NOTE) SARS-CoV-2 target nucleic acids are NOT DETECTED.  The  SARS-CoV-2 RNA is generally detectable in upper and lower respiratory specimens during the acute phase of infection. Negative results do not preclude SARS-CoV-2 infection, do not rule out co-infections with other pathogens, and should not be used as the sole basis for treatment or other patient management decisions. Negative results must be combined with clinical observations, patient history, and epidemiological information. The expected result is Negative.  Fact Sheet for Patients: SugarRoll.be  Fact Sheet for Healthcare Providers: https://www.woods-mathews.com/  This test is not yet approved or cleared by the [place] FDA and  has been authorized for detection and/or diagnosis of SARS-CoV-2 by FDA under an Emergency Use Authorization (EUA). This EUA will remain  in effect (meaning this test can be used) for the duration of the COVID-19 declaration under Se ction 564(b)(1) of the Act, 21 U.S.C. section 360bbb-3(b)(1), unless the authorization is terminated or revoked sooner.  Performed at [facility], [place] [address], [place], [place] [address]   SARS CORONAVIRUS 2 (TAT 6-24 HRS) Nasopharyngeal Nasopharyngeal Swab     Status: None   Collection Time: 12/05/20  3:10 PM   Specimen: Nasopharyngeal Swab  Result Value Ref Range Status   SARS Coronavirus 2 NEGATIVE NEGATIVE Final    Comment: (NOTE) SARS-CoV-2 target nucleic acids are NOT DETECTED.  The SARS-CoV-2 RNA is generally detectable in upper and lower respiratory specimens during the acute phase of infection. Negative results do not preclude SARS-CoV-2 infection, do not rule out co-infections with other pathogens, and should not be used as the sole basis for treatment or other patient management decisions. Negative results must be combined with clinical observations, patient history, and epidemiological information. The expected result is Negative.  Fact Sheet for  Patients: SugarRoll.be  Fact Sheet for Healthcare Providers: https://www.woods-mathews.com/  This test is not yet approved or cleared by the [place] FDA and  has  been authorized for detection and/or diagnosis of SARS-CoV-2 by FDA under an Emergency Use Authorization (EUA). This EUA will remain  in effect (meaning this test can be used) for the duration of the COVID-19 declaration under Se ction 564(b)(1) of the Act, 21 U.S.C. section 360bbb-3(b)(1), unless the authorization is terminated or revoked sooner.  Performed at [facility], [place] [address] [place], [place] [address]   SARS CORONAVIRUS 2 (TAT 6-24 HRS) Nasopharyngeal Nasopharyngeal Swab     Status: None   Collection Time: 12/07/20 12:34 PM   Specimen: Nasopharyngeal Swab  Result Value Ref Range Status   SARS Coronavirus 2 NEGATIVE NEGATIVE Final    Comment: (NOTE) SARS-CoV-2 target nucleic acids are NOT DETECTED.  The SARS-CoV-2 RNA is generally detectable in upper and lower respiratory specimens during the acute phase of infection. Negative results do not preclude SARS-CoV-2 infection, do not rule out co-infections with other pathogens, and should not be used as the sole basis for treatment or other patient management decisions. Negative results must be combined with clinical observations, patient history, and epidemiological information. The expected result is Negative.  Fact Sheet for Patients: SugarRoll.be  Fact Sheet for Healthcare Providers: https://www.woods-mathews.com/  This test is not yet approved or cleared by the [place] FDA and  has been authorized for detection and/or diagnosis of SARS-CoV-2 by FDA under an Emergency Use Authorization (EUA). This EUA will remain  in effect (meaning this test can be used) for the duration of the COVID-19 declaration under Se ction 564(b)(1) of the Act, 21 U.S.C. section  360bbb-3(b)(1), unless the authorization is terminated or revoked sooner.  Performed at [facility], [place] [address], [place], [place] [address]      Labs: Basic Metabolic Panel: Recent Labs  Lab 12/07/20 1632  CREATININE 0.76   Liver Function Tests: No results for input(s): AST, ALT, ALKPHOS, BILITOT, PROT, ALBUMIN in the last 168 hours.  No results for input(s): LIPASE, AMYLASE in the last 168 hours. No results for input(s): AMMONIA in the last 168 hours. CBC: No results for input(s): WBC, NEUTROABS, HGB, HCT, MCV, PLT in the last 168 hours.  Cardiac Enzymes: No results for input(s): CKTOTAL, CKMB, CKMBINDEX, TROPONINI in the last 168 hours. BNP: BNP (last 3 results) Recent Labs    07/24/20 0411 07/25/20 0501 07/26/20 0622  BNP 105.0* 123.0* 71.0    ProBNP (last 3 results) No results for input(s): PROBNP in the last 8760 hours.  CBG: Recent Labs  Lab 12/07/20 0651 12/07/20 1123 12/07/20 1716 12/07/20 2056 12/08/20 0642  GLUCAP 82 135* 146* 163* 74       Signed:  [facility] 12/08/2020, 8:28 AM

## 2020-12-08 NOTE — Progress Notes (Signed)
Physical Therapy Treatment Patient Details Name: Joel Perez MRN: [id number] DOB: 04-22-1946 Today's Date: 12/08/2020    History of Present Illness pt is a 75 y/o male admitted 7/7 with worsening weakness and R foot shown to have frank purulence.  R foot found to have osteomyelitis.  Pt has no furthe options for revascularization.  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  PMHx:  neuropathy, HTN, COPD, blindness R eye    PT Comments    Pt supine in bed on entry, voices frustration about not making it to SNF yet. Discussed getting up, however pt refuses due to incontinence of bowel and bladder every time he gets up. Pt able to participate in sidelying and supine bed exercises today before pulling himself up in the bed when placed in Trendelenburg. Pt hopeful for d/c to SNF today, otherwise PT will continue to follow acutely.    Follow Up Recommendations  SNF     Equipment Recommendations  3in1 (PT);Other (comment);Wheelchair (measurements PT);Wheelchair cushion (measurements PT) (tub or shower seat)       Precautions / Restrictions Precautions Precautions: Fall Restrictions Weight Bearing Restrictions: Yes RLE Weight Bearing: Non weight bearing Other Position/Activity Restrictions: limb protector for mobility    Mobility  Bed Mobility Overal bed mobility: Needs Assistance Bed Mobility: Supine to Sit;Sit to Supine Rolling: Min guard         General bed mobility comments: min guard for safety for rolling on L side for extension exercises of residual limb, with bed in [place] pt able to pull himself up in bed.           Balance Overall balance assessment: Needs assistance Sitting-balance support: No upper extremity supported;Feet supported;Feet unsupported Sitting balance-Leahy Scale: Fair         Standing balance comment: unable                            Cognition Arousal/Alertness: Awake/alert Behavior During Therapy: Flat affect Overall Cognitive Status: Within  Functional Limits for tasks assessed                                 General Comments: pt frustrated that he has not made it to SNF yet      Exercises Amputee Exercises Hip Extension: AROM;Right;Sidelying Hip ABduction/ADduction: AROM;Right;10 reps Hip Flexion/Marching: AROM;Right;10 reps;Supine Knee Flexion: AROM;Right;10 reps Knee Extension: AROM;Right;10 reps Straight Leg Raises: AROM;Right;10 reps;Supine    General Comments General comments (skin integrity, edema, etc.): Pt on 4L O2 via [place], VSS throughout. Pt reports not wanting to mobilize because his Pure Flow does not work with therapy. Educated pt on need to use urinal at SNF. Pt states he will just wait until he gets there.      Pertinent Vitals/Pain Pain Assessment: Faces Faces Pain Scale: Hurts little more Pain Location: amputation site Pain Descriptors / Indicators: Sore;Tender Pain Intervention(s): Limited activity within patient's tolerance;Monitored during session;Repositioned           PT Goals (current goals can now be found in the care plan section) Acute Rehab PT Goals Patient Stated Goal: pt agreed home as soon as possible, as able as possible and get prosthesis PT Goal Formulation: With patient Time For Goal Achievement: 12/12/20 Potential to Achieve Goals: Good Progress towards PT goals: Not progressing toward goals - comment (does not want to mobilize today)    Frequency    Min 3X/week  PT Plan Current plan remains appropriate       AM-PAC PT "6 Clicks" Mobility   Outcome Measure  Help needed turning from your back to your side while in a flat bed without using bedrails?: A Little Help needed moving from lying on your back to sitting on the side of a flat bed without using bedrails?: A Little Help needed moving to and from a bed to a chair (including a wheelchair)?: Total Help needed standing up from a chair using your arms (e.g., wheelchair or bedside chair)?:  Total Help needed to walk in hospital room?: Total Help needed climbing 3-5 steps with a railing? : Total 6 Click Score: 10    End of Session Equipment Utilized During Treatment: Oxygen Activity Tolerance: Patient tolerated treatment well Patient left: in bed;with call bell/phone within reach;with bed alarm set;with family/visitor present Nurse Communication: Mobility status PT Visit Diagnosis: Other abnormalities of gait and mobility (R26.89);Pain Pain - Right/Left: Right Pain - part of body: Leg     Time: 8921-1941 PT Time Calculation (min) (ACUTE ONLY): 15 min  Charges:  $Therapeutic Exercise: 8-22 mins                     Albana Saperstein B. Migdalia Dk PT, DPT Acute Rehabilitation Services Pager [phone] Office [phone]    [place] 12/08/2020, 12:43 PM

## 2020-12-08 NOTE — TOC Progression Note (Signed)
Transition of Care [facility]) - Progression Note    Patient Details  Name: JADAVION SPOELSTRA MRN: [id number] Date of Birth: [birth year]  [facility] CM/SW Contact  [name] Mila Homer, LCSW Phone Number: 12/08/2020, 5:29 PM  Clinical Narrative:  CSW talked with Irven Shelling, admissions director regarding patient discharging today to [facility]. CSW advised that she was just informed by upper management that patient was declined because his IV medication will cost $129.80 per day and insurance won't do a carve-out because his meds are less than $200 per day and they can't go over $65 per day for all meds. CSW visited with patient and wife and informed them.   Contact made with [facility] and [facility] and both can take patient. Patient and wife requested [facility] and per Narda Rutherford, they can take patient tomorrow, 7/19. Patient insurance authorization good through 7/19.   Barriers to Discharge: Continued Medical Work up  Expected Discharge Plan and Services     Discharge Planning Services: CM Consult   Living arrangements for the past 2 months: Single Family Home Expected Discharge Date: 12/08/20                                     Social Determinants of Health (SDOH) Interventions  None needed or requested at this time  Readmission Risk Interventions No flowsheet data found.

## 2020-12-09 ENCOUNTER — Ambulatory Visit: Payer: Medicare Other

## 2020-12-09 ENCOUNTER — Ambulatory Visit: Payer: Medicare Other | Admitting: Internal Medicine

## 2020-12-09 DIAGNOSIS — I739 Peripheral vascular disease, unspecified: Secondary | ICD-10-CM | POA: Diagnosis not present

## 2020-12-09 DIAGNOSIS — R5381 Other malaise: Secondary | ICD-10-CM | POA: Diagnosis not present

## 2020-12-09 DIAGNOSIS — Z89511 Acquired absence of right leg below knee: Secondary | ICD-10-CM | POA: Diagnosis not present

## 2020-12-09 DIAGNOSIS — L98419 Non-pressure chronic ulcer of buttock with unspecified severity: Secondary | ICD-10-CM | POA: Diagnosis not present

## 2020-12-09 DIAGNOSIS — R531 Weakness: Secondary | ICD-10-CM | POA: Diagnosis not present

## 2020-12-09 DIAGNOSIS — R918 Other nonspecific abnormal finding of lung field: Secondary | ICD-10-CM | POA: Diagnosis not present

## 2020-12-09 DIAGNOSIS — Z452 Encounter for adjustment and management of vascular access device: Secondary | ICD-10-CM | POA: Diagnosis not present

## 2020-12-09 DIAGNOSIS — M86172 Other acute osteomyelitis, left ankle and foot: Secondary | ICD-10-CM | POA: Diagnosis not present

## 2020-12-09 DIAGNOSIS — G629 Polyneuropathy, unspecified: Secondary | ICD-10-CM | POA: Diagnosis not present

## 2020-12-09 DIAGNOSIS — R7881 Bacteremia: Secondary | ICD-10-CM | POA: Diagnosis not present

## 2020-12-09 DIAGNOSIS — Z743 Need for continuous supervision: Secondary | ICD-10-CM | POA: Diagnosis not present

## 2020-12-09 DIAGNOSIS — M6281 Muscle weakness (generalized): Secondary | ICD-10-CM | POA: Diagnosis not present

## 2020-12-09 DIAGNOSIS — I10 Essential (primary) hypertension: Secondary | ICD-10-CM | POA: Diagnosis not present

## 2020-12-09 DIAGNOSIS — E119 Type 2 diabetes mellitus without complications: Secondary | ICD-10-CM | POA: Diagnosis not present

## 2020-12-09 DIAGNOSIS — R2689 Other abnormalities of gait and mobility: Secondary | ICD-10-CM | POA: Diagnosis not present

## 2020-12-09 DIAGNOSIS — R0609 Other forms of dyspnea: Secondary | ICD-10-CM | POA: Diagnosis not present

## 2020-12-09 DIAGNOSIS — J449 Chronic obstructive pulmonary disease, unspecified: Secondary | ICD-10-CM | POA: Diagnosis not present

## 2020-12-09 DIAGNOSIS — I5030 Unspecified diastolic (congestive) heart failure: Secondary | ICD-10-CM | POA: Diagnosis not present

## 2020-12-09 DIAGNOSIS — E785 Hyperlipidemia, unspecified: Secondary | ICD-10-CM | POA: Diagnosis not present

## 2020-12-09 DIAGNOSIS — R2681 Unsteadiness on feet: Secondary | ICD-10-CM | POA: Diagnosis not present

## 2020-12-09 DIAGNOSIS — A419 Sepsis, unspecified organism: Secondary | ICD-10-CM | POA: Diagnosis not present

## 2020-12-09 DIAGNOSIS — G479 Sleep disorder, unspecified: Secondary | ICD-10-CM | POA: Diagnosis not present

## 2020-12-09 DIAGNOSIS — I351 Nonrheumatic aortic (valve) insufficiency: Secondary | ICD-10-CM | POA: Diagnosis not present

## 2020-12-09 DIAGNOSIS — Z7401 Bed confinement status: Secondary | ICD-10-CM | POA: Diagnosis not present

## 2020-12-09 DIAGNOSIS — K5900 Constipation, unspecified: Secondary | ICD-10-CM | POA: Diagnosis not present

## 2020-12-09 DIAGNOSIS — M869 Osteomyelitis, unspecified: Secondary | ICD-10-CM | POA: Diagnosis not present

## 2020-12-09 DIAGNOSIS — B9561 Methicillin susceptible Staphylococcus aureus infection as the cause of diseases classified elsewhere: Secondary | ICD-10-CM | POA: Diagnosis not present

## 2020-12-09 DIAGNOSIS — J9611 Chronic respiratory failure with hypoxia: Secondary | ICD-10-CM | POA: Diagnosis not present

## 2020-12-09 LAB — GLUCOSE, CAPILLARY
Glucose-Capillary: 72 mg/dL (ref 70–99)
Glucose-Capillary: 134 mg/dL — ABNORMAL HIGH (ref 70–99)

## 2020-12-09 NOTE — Progress Notes (Signed)
Patient report called to Maudie Mercury at [facility].  Haydee Salter, RN

## 2020-12-09 NOTE — Plan of Care (Signed)
  Problem: Health Behavior/Discharge Planning: Goal: Ability to manage health-related needs will improve Outcome: Progressing   Problem: Clinical Measurements: Goal: Ability to maintain clinical measurements within normal limits will improve Outcome: Progressing Goal: Will remain free from infection Outcome: Progressing Goal: Cardiovascular complication will be avoided Outcome: Progressing   Problem: Nutrition: Goal: Adequate nutrition will be maintained Outcome: Progressing   Problem: Coping: Goal: Level of anxiety will decrease Outcome: Progressing   Problem: Pain Managment: Goal: General experience of comfort will improve Outcome: Progressing   Problem: Safety: Goal: Ability to remain free from injury will improve Outcome: Progressing

## 2020-12-09 NOTE — Progress Notes (Signed)
[facility] here to pick up patient and transport to [place].  Haydee Salter, RN

## 2020-12-09 NOTE — Progress Notes (Signed)
Attempted to call report to [facility]. Nurse unavailable, will call back.  Haydee Salter, RN

## 2020-12-09 NOTE — Plan of Care (Signed)
  Problem: Health Behavior/Discharge Planning: Goal: Ability to manage health-related needs will improve Outcome: Adequate for Discharge   Problem: Clinical Measurements: Goal: Ability to maintain clinical measurements within normal limits will improve Outcome: Adequate for Discharge Goal: Will remain free from infection Outcome: Adequate for Discharge Goal: Diagnostic test results will improve Outcome: Adequate for Discharge Goal: Respiratory complications will improve Outcome: Adequate for Discharge Goal: Cardiovascular complication will be avoided Outcome: Adequate for Discharge   Problem: Activity: Goal: Risk for activity intolerance will decrease Outcome: Adequate for Discharge   Problem: Nutrition: Goal: Adequate nutrition will be maintained Outcome: Adequate for Discharge   Problem: Coping: Goal: Level of anxiety will decrease Outcome: Adequate for Discharge   Problem: Elimination: Goal: Will not experience complications related to bowel motility Outcome: Adequate for Discharge Goal: Will not experience complications related to urinary retention Outcome: Adequate for Discharge   Problem: Pain Managment: Goal: General experience of comfort will improve Outcome: Adequate for Discharge   Problem: Safety: Goal: Ability to remain free from injury will improve Outcome: Adequate for Discharge   Problem: Skin Integrity: Goal: Risk for impaired skin integrity will decrease Outcome: Adequate for Discharge   Haydee Salter

## 2020-12-09 NOTE — Discharge Summary (Signed)
Physician Discharge Summary  Joel Perez NAT:[id number] DOB: 30-Dec-1945 DOA: 11/23/2020  PCP: [name], MD  Admit date: 11/23/2020 Discharge date: 12/09/2020  Time spent: 37 minutes  Patient remains medically stable and agreeable for discharge to SNF. Continues to have issues with antibiotic coverage due to cost despite being a generic cephalosporin.  No acute issues/events overnight.  Recommendations for Outpatient Follow-up:  Requires outpatient amputation management/postop care as per vascular surgeon Dr. Vanessa Barbara to remain in wound until 12/27/2020, weightbearing status deferred to surgeon's but probably nonweightbearing Continue Ancef as per OPAT orders in the outpatient setting for at least 6 weeks ending on 01/09/2019-[facility] physician Dr. Juleen China to follow-up in the outpatient setting regarding extension of this versus not Needs CBC Chem-12 1 week in outpatient setting Needs outpatient follow-up with Dr. [name] (pulmonology) regarding tapering of prednisone and follow-up right upper lobe 1.5 spiculated mass-I will CC him for this to be arranged Note dosage changes of various medications as per [facility] Will need outpatient discussion regarding prediabetes and sugars in addition to BMI 32 although suspect will be difficult with amputation for significant physical activity at this time Requires oxygen 4 L at all times given advanced COPD  Discharge Diagnoses:  Nonhealing right fifth toe resulting in R BKA 11/25/2020 MSSA bacteremia still present-extension of course of antibiotics till 8/18 - PICC line placed 7/10 to be removed at the end of therapy or as per infectious disease physician Dr. Juleen China  Please see below for itemized issues addressed in [facility]- refer to other progress notes for clarity if needed  Discharge Condition: Fair  Diet recommendation: Heart healthy low-salt-consider diabetic diet in the outpatient setting  Filed Weights   12/07/20 0700 12/08/20 0611 12/09/20  0439  Weight: 100.4 kg 101.2 kg 101.2 kg    History of present illness:   MSSA s/p R sided BKA 7/5 PM Blood culture X2 from 7/3 confirmed staph-surveillance cultures 7/5 neg Echocardiogram 7/6 no valvular vegetations--- TEE contemplated but deferred secondary to high oxygen requirement and risk of procedure therefore ID rec's 6 wk Abx ANCEF ending 01/08/21--PICC  placed 7/10-please remove PICC line once no longer required Oxy IR 5-10 every 4 --Gabapentin 300 twice daily for nerve pain, continue Flexeril 10 at bedtime in addition Stable for DC to skilled facility when bed available RLL infiltrate?  Postobstructive CXR 7/7 possible PNA-[unlikely--no sputum, no cough worse than prior]--completed 7/11-Ancef as above RUL 1.5 spiculated mass Severe COPD on 4 L of oxygen Outpatient follow-up Dr. Molli Posey will CC Dr. [name] on this discharge summary for care coordination regarding mass, COPD etc. Attempt in the outpatient setting to de-escalate steroids and will CC Dr. [name] to coordinate this Recent COVID-19 infection Status post treatment --curently asymptomatic, completed CDC guided quarantine HFpEF EF 60-65% Losartan resumed at 25 mg dose-May need to titrate in the outpatient Lasix 20 on hold Revatio 20 on hold till Op re-evl DM type 2, questionable history Lab Results  Component Value Date   HGBA1C 6.5 (H) 11/23/2020   Obesity BMI 32 Not on any prior to admission meds-sugars trending below 180 Discontinue sliding scale   Procedures: BKA as above  Consultations: Vascular surgery  Discharge Exam: Vitals:   12/08/20 2331 12/09/20 0439  BP: 119/75 138/78  Pulse: 87 77  Resp: 16 16  Temp: 97.6 F (36.4 C) (!) 97.4 F (36.3 C)  SpO2: 98% 98%    Subj on day of d/c   Well no issues On oxygen  General Exam on discharge  No  distress  EOMI NCAT no focal deficit Moving limbs Chest is clear  No mass no swelling no rash Picture of wound from 7/10 included as below; bandage  clean/dry/intact today    Discharge Instructions   Discharge Instructions     [facility] pharmacist to adjust dose for Vancomycin, Aminoglycosides and other anti-infective therapies as requested by physician.   Complete by: As directed    [facility] to provide Cath Flo 2mg    Complete by: As directed    Administer for PICC line occlusion and as ordered by physician for other access device issues.   Anaphylaxis Kit: Provided to treat any anaphylactic reaction to the medication being provided to the patient if First Dose or when requested by physician   Complete by: As directed    Epinephrine 1mg /ml vial / amp: Administer 0.3mg  (0.33ml) subcutaneously once for moderate to severe anaphylaxis, nurse to call physician and pharmacy when reaction occurs and call 911 if needed for immediate care   Diphenhydramine 50mg /ml IV vial: Administer 25-50mg  IV/IM PRN for first dose reaction, rash, itching, mild reaction, nurse to call physician and pharmacy when reaction occurs   Sodium Chloride 0.9% NS 544ml IV: Administer if needed for hypovolemic blood pressure drop or as ordered by physician after call to physician with anaphylactic reaction   Call MD for:  redness, tenderness, or signs of infection (pain, swelling, redness, odor or green/yellow discharge around incision site)   Complete by: As directed    Call MD for:  severe uncontrolled pain   Complete by: As directed    Call MD for:  temperature >100.4   Complete by: As directed    Change dressing on IV access line weekly and PRN   Complete by: As directed    Diet - low sodium heart healthy   Complete by: As directed    Discharge wound care:   Complete by: As directed    As above   Discharge wound care:   Complete by: As directed    Wound Care: Every morning      Comments: Xeroform gauze over right bKA incision.  Cover with 4 x 4 and kerlix then compression sock over this.  Please change once daily   Wound care   Daily      Comments: Cleanse wounds on the upper, posterior thighs with soap and water, pat dry. Place foam dressings over the wounds.  Perform daily and prn dislodgement or soiling   Flush IV access with Sodium Chloride 0.9% and Heparin 10 units/ml or 100 units/ml   Complete by: As directed    Home infusion instructions - [facility]   Complete by: As directed    Instructions: Flush IV access with Sodium Chloride 0.9% and Heparin 10units/ml or 100units/ml   Change dressing on IV access line: Weekly and PRN   Instructions Cath Flo 2mg : Administer for PICC Line occlusion and as ordered by physician for other access device   [facility] pharmacist to adjust dose for: Vancomycin, Aminoglycosides and other anti-infective therapies as requested by physician   Increase activity slowly   Complete by: As directed    Increase activity slowly   Complete by: As directed    Method of administration may be changed at the discretion of home infusion pharmacist based upon assessment of the patient and/or caregiver's ability to self-administer the medication ordered   Complete by: As directed       Allergies as of 12/09/2020  Reactions   Other Swelling, Other (See Comments)   Farmed Fish (tightness in throat & lip swelling)   Codeine Rash   Sulfa Antibiotics Hives        Medication List     STOP taking these medications    loperamide 2 MG capsule Commonly known as: IMODIUM   oxyCODONE-acetaminophen 10-325 MG tablet Commonly known as: PERCOCET   saccharomyces boulardii 250 MG capsule Commonly known as: FLORASTOR   sildenafil 20 MG tablet Commonly known as: REVATIO       TAKE these medications    acetaminophen 325 MG tablet Commonly known as: TYLENOL Take 2 tablets (650 mg total) by mouth every 6 (six) hours.   albuterol (2.5 MG/3ML) 0.083% nebulizer solution Commonly known as: PROVENTIL USE 1 VIAL IN NEBULIZER EVERY 6 HOURS AS NEEDED FOR WHEEZING AND  FOR SHORTNESS OF BREATH What changed: See the new instructions.   ProAir HFA 108 (90 Base) MCG/ACT inhaler Generic drug: albuterol INHALE 2 PUFFS BY MOUTH EVERY 6 HOURS AS NEEDED FOR WHEEZING FOR SHORTNESS OF BREATH What changed: See the new instructions.   aspirin EC 81 MG tablet Take 81 mg by mouth daily. Swallow whole.   atorvastatin 40 MG tablet Commonly known as: LIPITOR TAKE 1 TABLET BY MOUTH ONCE DAILY . APPOINTMENT REQUIRED FOR FUTURE REFILLS What changed: See the new instructions.   Breztri Aerosphere 160-9-4.8 MCG/ACT Aero Generic drug: Budeson-Glycopyrrol-Formoterol Take 2 puffs first thing in am and then another 2 puffs about 12 hours later. What changed:  how much to take how to take this when to take this additional instructions   ceFAZolin  IVPB Commonly known as: ANCEF Inject 2 g into the vein every 8 (eight) hours. Indication:  osteomyelitis and recurrent MSSA bacteremia First Dose: Yes Last Day of Therapy:  01/08/2021 Labs - Once weekly:  CBC/D and BMP, Labs - Every other week:  ESR and CRP Method of administration: IV Push Method of administration may be changed at the discretion of home infusion pharmacist based upon assessment of the patient and/or caregiver's ability to self-administer the medication ordered.   cyclobenzaprine 10 MG tablet Commonly known as: FLEXERIL Take 10 mg by mouth at bedtime.   furosemide 20 MG tablet Commonly known as: LASIX TAKE 1 TABLET BY MOUTH ONCE DAILY What changed:  how much to take how to take this when to take this additional instructions Another medication with the same name was removed. Continue taking this medication, and follow the directions you see here.   gabapentin 600 MG tablet Commonly known as: NEURONTIN Take 0.5 tablets (300 mg total) by mouth 2 (two) times daily. What changed:  how much to take when to take this   losartan 25 MG tablet Commonly known as: COZAAR Take 1 tablet (25 mg total) by  mouth daily. What changed:  medication strength how much to take   Melatonin 10 MG Tabs Take 1 tablet by mouth at bedtime.   multivitamin with minerals Tabs tablet Take 1 tablet by mouth daily.   oxyCODONE 5 MG immediate release tablet Commonly known as: Oxy IR/ROXICODONE Take 1-2 tablets (5-10 mg total) by mouth every 4 (four) hours as needed for severe pain.   pantoprazole 40 MG tablet Commonly known as: PROTONIX Take 1 tablet (40 mg total) by mouth daily.   polyethylene glycol 17 g packet Commonly known as: MIRALAX / GLYCOLAX Take 17 g by mouth daily as needed for mild constipation.   predniSONE 10 MG tablet Commonly known as: DELTASONE  2 until better then one daily What changed: when to take this   sodium chloride 0.65 % nasal spray Commonly known as: OCEAN Place 1 spray into the nose daily as needed for congestion.   vitamin B-12 1000 MCG tablet Commonly known as: CYANOCOBALAMIN Take 1,000 mcg by mouth daily.   vitamin C 500 MG tablet Commonly known as: ASCORBIC ACID Take 500 mg by mouth daily.               Discharge Care Instructions  (From admission, onward)           Start     Ordered   12/06/20 0000  Discharge wound care:       Comments: Wound Care: Every morning      Comments: Xeroform gauze over right bKA incision.  Cover with 4 x 4 and kerlix then compression sock over this.  Please change once daily   Wound care  Daily      Comments: Cleanse wounds on the upper, posterior thighs with soap and water, pat dry. Place foam dressings over the wounds.  Perform daily and prn dislodgement or soiling   12/06/20 0755   12/01/20 0000  Change dressing on IV access line weekly and PRN  (Home infusion instructions - [facility] )        12/01/20 1331   12/01/20 0000  Discharge wound care:       Comments: As above   12/01/20 1331           Allergies  Allergen Reactions   Other Swelling and Other (See Comments)    Farmed Fish  (tightness in throat & lip swelling)   Codeine Rash   Sulfa Antibiotics Hives    Follow-up Information     [facility] Follow up in 1 month(s).   Why: The office will call the patient with an appointment Contact information: [address] [place] [address] [phone]        Kathlynn Grate, DO Follow up.   Specialties: Infectious Diseases, Internal Medicine Why: 12/26/20 at 9am. Please call to schedule if you are not able to make this appointment. Contact information: [address] [employer] [address] [place] [address] [phone]                  The results of significant diagnostics from this hospitalization (including imaging, microbiology, ancillary and laboratory) are listed below for reference.    Significant Diagnostic Studies: DG Chest 1 View  Result Date: 11/23/2020 CLINICAL DATA:  Concern for osteomyelitis of the RIGHT foot. Recent partial RIGHT foot amputation. Weakness. EXAM: CHEST  1 VIEW COMPARISON:  07/22/2020 FINDINGS: Stable cardiomegaly. Patchy opacity is identified in the RIGHT lung base, consistent with infectious infiltrate. There is no pulmonary edema. Within the RIGHT UPPER lobe, a 1.5 centimeter spiculated mass and adjacent satellite nodule are again noted. IMPRESSION: 1. RIGHT LOWER lobe infiltrates. 2. RIGHT UPPER lobe masses, as seen on prior exams. Electronically Signed   By: Norva Pavlov M.D.   On: 11/23/2020 14:35   DG Chest 2 View  Result Date: 11/27/2020 CLINICAL DATA:  Pneumonia follow-up EXAM: CHEST - 2 VIEW COMPARISON:  11/23/2020 FINDINGS: Persistent opacity at the right lung base. Nodular opacities are identified in the right lung. Increased density at the left lung base. Small bilateral pleural effusions. Similar cardiomediastinal contours. No pneumothorax. IMPRESSION: Persistent opacities at the right lung base and right lung nodules. Increased left basilar atelectasis/consolidation. Small bilateral  pleural effusions. Electronically Signed   By: Macy Mis M.D.   On: 11/27/2020 09:08   MR FOOT RIGHT WO CONTRAST  Result Date: 11/25/2020 CLINICAL DATA:  History of partial fifth ray amputation 08/28/2020. Pain, swelling and open wound with hernial and discharge for 2 days. EXAM: MRI OF THE RIGHT FOREFOOT WITHOUT CONTRAST TECHNIQUE: Multiplanar, multisequence MR imaging of the right foot was performed. No intravenous contrast was administered. COMPARISON:  Radiograph 11/23/2020 FINDINGS: Extensive and advanced changes of osteomyelitis involving the residual fifth metatarsal which is destroyed. Adjacent fluid collection measures 4.5 cm and is highly suspicious for an abscess. There is also an open wound in this area. There is severe diffuse cellulitis and myofasciitis. The other bony structures are intact. IMPRESSION: 1. Extensive and advanced changes of osteomyelitis involving the residual fifth metatarsal. 2. Adjacent 4.5 cm fluid collection highly suspicious for an abscess. 3. Severe diffuse cellulitis and myofasciitis. Electronically Signed   By: Marijo Sanes M.D.   On: 11/25/2020 07:55   DG Foot Complete Right  Result Date: 11/23/2020 CLINICAL DATA:  Recent partial amputation, concern for osteomyelitis. EXAM: RIGHT FOOT COMPLETE - 3+ VIEW COMPARISON:  08/25/2020 FINDINGS: Prior amputation of the small toe. Recognizable 3.1 cm proximal portion of the fifth metatarsal noted, lung distal margin of this there is a 3.8 cm region of amorphous hazy calcification suspicious for a destroyed bony fragment from osteomyelitis, less likely to be heterotopic ossification along the prior amputation. No appreciable destructive findings along the remaining metatarsals or phalanges. Mildly exaggerated longitudinal arch of the foot on the lateral projection which is not obtained with the patient standing. Extensive soft tissue swelling along the forefoot especially dorsally. Irregularity of the cutaneous surface along  the amputation site and lateral to the fifth metatarsal. Small Achilles calcaneal spur. IMPRESSION: 1. 3.8 cm region of amorphous hazy calcification corresponding to the shaft of the fifth metatarsal, probably bony destruction from active osteomyelitis and less likely to from heterotopic ossification along the site of prior amputation. There is a 3.1 cm fragment of proximal fifth metatarsal which appears recognizable, proximal to this hazy calcification. Soft tissue swelling and irregularity of the overlying soft tissues. Electronically Signed   By: Van Clines M.D.   On: 11/23/2020 15:10   ECHOCARDIOGRAM COMPLETE  Result Date: 11/26/2020    ECHOCARDIOGRAM REPORT   Patient Name:   [name] Date of Exam: 11/26/2020 Medical Rec #:  [id number]     Height:       70.0 in Accession #:    [id number]    Weight:       227.7 lb Date of Birth:  [birth year]     BSA:          2.206 m Patient Age:    67 years      BP:           135/63 mmHg Patient Gender: M             HR:           92 bpm. Exam Location:  Inpatient Procedure: 2D Echo, Cardiac Doppler and Color Doppler Indications:    bacteremia  History:        Patient has prior history of Echocardiogram examinations, most                 recent 07/22/2020. Risk Factors:Former Smoker.  Sonographer:    Cammy Brochure Referring Phys: [id number] [place]  1. Left ventricular ejection fraction, by estimation,  is 60 to 65%. The left ventricle has normal function. The left ventricle has no regional wall motion abnormalities. There is mild left ventricular hypertrophy. Left ventricular diastolic parameters were normal.  2. Right ventricular systolic function is normal. The right ventricular size is normal.  3. The mitral valve is normal in structure. Trivial mitral valve regurgitation. No evidence of mitral stenosis.  4. The aortic valve is calcified. There is moderate calcification of the aortic valve. There is moderate thickening of the aortic valve.  Aortic valve regurgitation is trivial. Mild aortic valve stenosis.  5. The inferior vena cava is normal in size with greater than 50% respiratory variability, suggesting right atrial pressure of 3 mmHg. Comparison(s): No significant change from prior study. Conclusion(s)/Recommendation(s): No evidence of valvular vegetations on this transthoracic echocardiogram. Would recommend a transesophageal echocardiogram to exclude infective endocarditis if clinically indicated. FINDINGS  Left Ventricle: Left ventricular ejection fraction, by estimation, is 60 to 65%. The left ventricle has normal function. The left ventricle has no regional wall motion abnormalities. The left ventricular internal cavity size was normal in size. There is  mild left ventricular hypertrophy. Left ventricular diastolic parameters were normal. Right Ventricle: The right ventricular size is normal. No increase in right ventricular wall thickness. Right ventricular systolic function is normal. Left Atrium: Left atrial size was normal in size. Right Atrium: Right atrial size was normal in size. Pericardium: There is no evidence of pericardial effusion. Mitral Valve: The mitral valve is normal in structure. Trivial mitral valve regurgitation. No evidence of mitral valve stenosis. Tricuspid Valve: The tricuspid valve is normal in structure. Tricuspid valve regurgitation is trivial. No evidence of tricuspid stenosis. Aortic Valve: The aortic valve is calcified. There is moderate calcification of the aortic valve. There is moderate thickening of the aortic valve. Aortic valve regurgitation is trivial. Mild aortic stenosis is present. Aortic valve mean gradient measures 5.7 mmHg. Aortic valve peak gradient measures 10.2 mmHg. Aortic valve area, by VTI measures 2.80 cm. Pulmonic Valve: The pulmonic valve was not well visualized. Pulmonic valve regurgitation is not visualized. Aorta: The aortic root and ascending aorta are structurally normal, with no  evidence of dilitation. Venous: The inferior vena cava is normal in size with greater than 50% respiratory variability, suggesting right atrial pressure of 3 mmHg. IAS/Shunts: The atrial septum is grossly normal.  LEFT VENTRICLE PLAX 2D LVIDd:         4.50 cm LVIDs:         3.00 cm LV PW:         1.40 cm LV IVS:        0.90 cm LVOT diam:     2.10 cm LV SV:         75 LV SV Index:   34 LVOT Area:     3.46 cm  RIGHT VENTRICLE          IVC RV Basal diam:  3.50 cm  IVC diam: 0.70 cm LEFT ATRIUM             Index       RIGHT ATRIUM           Index LA diam:        3.00 cm 1.36 cm/m  RA Area:     11.50 cm LA Vol (A2C):   54.9 ml 24.89 ml/m RA Volume:   23.70 ml  10.74 ml/m LA Vol (A4C):   50.9 ml 23.08 ml/m LA Biplane Vol: 54.3 ml 24.62 ml/m  AORTIC VALVE  AV Area (Vmax):    2.86 cm AV Area (Vmean):   2.82 cm AV Area (VTI):     2.80 cm AV Vmax:           159.33 cm/s AV Vmean:          110.000 cm/s AV VTI:            0.269 m AV Peak Grad:      10.2 mmHg AV Mean Grad:      5.7 mmHg LVOT Vmax:         131.67 cm/s LVOT Vmean:        89.567 cm/s LVOT VTI:          0.217 m LVOT/AV VTI ratio: 0.81  AORTA Ao Root diam: 3.30 cm Ao Asc diam:  2.90 cm  SHUNTS Systemic VTI:  0.22 m Systemic Diam: 2.10 cm Buford Dresser MD Electronically signed by Buford Dresser MD Signature Date/Time: 11/26/2020/2:45:08 PM    Final    [place] EKG Site Rite  Result Date: 11/28/2020 If Site Rite image not attached, placement could not be confirmed due to current cardiac rhythm.   Microbiology: Recent Results (from the past 240 hour(s))  SARS CORONAVIRUS 2 (TAT 6-24 HRS) Nasopharyngeal Nasopharyngeal Swab     Status: None   Collection Time: 12/01/20  2:40 PM   Specimen: Nasopharyngeal Swab  Result Value Ref Range Status   SARS Coronavirus 2 NEGATIVE NEGATIVE Final    Comment: (NOTE) SARS-CoV-2 target nucleic acids are NOT DETECTED.  The SARS-CoV-2 RNA is generally detectable in upper and lower respiratory specimens during  the acute phase of infection. Negative results do not preclude SARS-CoV-2 infection, do not rule out co-infections with other pathogens, and should not be used as the sole basis for treatment or other patient management decisions. Negative results must be combined with clinical observations, patient history, and epidemiological information. The expected result is Negative.  Fact Sheet for Patients: SugarRoll.be  Fact Sheet for Healthcare Providers: https://www.woods-mathews.com/  This test is not yet approved or cleared by the [place] FDA and  has been authorized for detection and/or diagnosis of SARS-CoV-2 by FDA under an Emergency Use Authorization (EUA). This EUA will remain  in effect (meaning this test can be used) for the duration of the COVID-19 declaration under Se ction 564(b)(1) of the Act, 21 U.S.C. section 360bbb-3(b)(1), unless the authorization is terminated or revoked sooner.  Performed at [facility], [place] [address], [place], [place] [address]   SARS CORONAVIRUS 2 (TAT 6-24 HRS) Nasopharyngeal Nasopharyngeal Swab     Status: None   Collection Time: 12/05/20  3:10 PM   Specimen: Nasopharyngeal Swab  Result Value Ref Range Status   SARS Coronavirus 2 NEGATIVE NEGATIVE Final    Comment: (NOTE) SARS-CoV-2 target nucleic acids are NOT DETECTED.  The SARS-CoV-2 RNA is generally detectable in upper and lower respiratory specimens during the acute phase of infection. Negative results do not preclude SARS-CoV-2 infection, do not rule out co-infections with other pathogens, and should not be used as the sole basis for treatment or other patient management decisions. Negative results must be combined with clinical observations, patient history, and epidemiological information. The expected result is Negative.  Fact Sheet for Patients: SugarRoll.be  Fact Sheet for Healthcare  Providers: https://www.woods-mathews.com/  This test is not yet approved or cleared by the [place] FDA and  has been authorized for detection and/or diagnosis of SARS-CoV-2 by FDA under an Emergency Use Authorization (EUA). This EUA  will remain  in effect (meaning this test can be used) for the duration of the COVID-19 declaration under Se ction 564(b)(1) of the Act, 21 U.S.C. section 360bbb-3(b)(1), unless the authorization is terminated or revoked sooner.  Performed at [facility], [place] [address] [place], [place] [address]   SARS CORONAVIRUS 2 (TAT 6-24 HRS) Nasopharyngeal Nasopharyngeal Swab     Status: None   Collection Time: 12/07/20 12:34 PM   Specimen: Nasopharyngeal Swab  Result Value Ref Range Status   SARS Coronavirus 2 NEGATIVE NEGATIVE Final    Comment: (NOTE) SARS-CoV-2 target nucleic acids are NOT DETECTED.  The SARS-CoV-2 RNA is generally detectable in upper and lower respiratory specimens during the acute phase of infection. Negative results do not preclude SARS-CoV-2 infection, do not rule out co-infections with other pathogens, and should not be used as the sole basis for treatment or other patient management decisions. Negative results must be combined with clinical observations, patient history, and epidemiological information. The expected result is Negative.  Fact Sheet for Patients: SugarRoll.be  Fact Sheet for Healthcare Providers: https://www.woods-mathews.com/  This test is not yet approved or cleared by the [place] FDA and  has been authorized for detection and/or diagnosis of SARS-CoV-2 by FDA under an Emergency Use Authorization (EUA). This EUA will remain  in effect (meaning this test can be used) for the duration of the COVID-19 declaration under Se ction 564(b)(1) of the Act, 21 U.S.C. section 360bbb-3(b)(1), unless the authorization is terminated or revoked  sooner.  Performed at [facility], [place] [address] [place], [place] [address]      Labs: Basic Metabolic Panel: Recent Labs  Lab 12/07/20 1632  CREATININE 0.76   Liver Function Tests: No results for input(s): AST, ALT, ALKPHOS, BILITOT, PROT, ALBUMIN in the last 168 hours.  No results for input(s): LIPASE, AMYLASE in the last 168 hours. No results for input(s): AMMONIA in the last 168 hours. CBC: No results for input(s): WBC, NEUTROABS, HGB, HCT, MCV, PLT in the last 168 hours.  Cardiac Enzymes: No results for input(s): CKTOTAL, CKMB, CKMBINDEX, TROPONINI in the last 168 hours. BNP: BNP (last 3 results) Recent Labs    07/24/20 0411 07/25/20 0501 07/26/20 0622  BNP 105.0* 123.0* 71.0    ProBNP (last 3 results) No results for input(s): PROBNP in the last 8760 hours.  CBG: Recent Labs  Lab 12/08/20 0642 12/08/20 1127 12/08/20 1631 12/08/20 2213 12/09/20 0634  GLUCAP 74 120* 202* 138* 72       Signed:  Little Ishikawa DO   [facility] 12/09/2020, 7:55 AM

## 2020-12-09 NOTE — TOC Transition Note (Signed)
Transition of Care [facility]) - CM/SW Discharge Note *Discharged to [facility] *Number for Report: [phone] *Room 3239   Patient Details  Name: Joel Perez MRN: [id number] Date of Birth: June 04, 1945  Transition of Care [facility]) CM/SW Contact:  Sable Feil, LCSW Phone Number: 12/09/2020, 12:28 PM   Clinical Narrative: Patient medically stable for discharge and going to [facility] for [place] rehab.  Discharge clinicals transmitted to facility and ambulance transport arranged. Patient's wife completed admissions paperwork at [facility] prior to patient leaving the hospital.       Final next level of care: [facility]) Barriers to Discharge: Barriers Resolved   Patient Goals and CMS Choice Patient states their goals for this hospitalization and ongoing recovery are:: Patient and wife in agreement with ST rehab before returning home CMS Medicare.gov Compare Post Acute Care list provided to:: Patient Represenative (must comment) Choice offered to / list presented to : Patient, Spouse  Discharge Placement PASRR number recieved: 12/01/20            Patient chooses bed at: [facility] Patient to be transferred to facility by: Non-emergency ambulance transport Name of family member notified: Wife Ernst Bowler at the bedside Patient and family notified of of transfer: 12/09/20  Discharge Plan and Services   Discharge Planning Services: CM Consult                                 Social Determinants of Health ([place]) Interventions  No SDOH interventions requested or needed at discharge   Readmission Risk Interventions No flowsheet data found.

## 2020-12-10 ENCOUNTER — Ambulatory Visit: Payer: Medicare Other | Admitting: Internal Medicine

## 2020-12-10 DIAGNOSIS — R7881 Bacteremia: Secondary | ICD-10-CM | POA: Diagnosis not present

## 2020-12-10 DIAGNOSIS — R918 Other nonspecific abnormal finding of lung field: Secondary | ICD-10-CM | POA: Diagnosis not present

## 2020-12-10 DIAGNOSIS — M869 Osteomyelitis, unspecified: Secondary | ICD-10-CM | POA: Diagnosis not present

## 2020-12-10 DIAGNOSIS — J449 Chronic obstructive pulmonary disease, unspecified: Secondary | ICD-10-CM | POA: Diagnosis not present

## 2020-12-11 DIAGNOSIS — I10 Essential (primary) hypertension: Secondary | ICD-10-CM | POA: Diagnosis not present

## 2020-12-11 DIAGNOSIS — E785 Hyperlipidemia, unspecified: Secondary | ICD-10-CM | POA: Diagnosis not present

## 2020-12-11 DIAGNOSIS — L98419 Non-pressure chronic ulcer of buttock with unspecified severity: Secondary | ICD-10-CM | POA: Diagnosis not present

## 2020-12-11 DIAGNOSIS — G629 Polyneuropathy, unspecified: Secondary | ICD-10-CM | POA: Diagnosis not present

## 2020-12-11 DIAGNOSIS — J9611 Chronic respiratory failure with hypoxia: Secondary | ICD-10-CM | POA: Diagnosis not present

## 2020-12-11 DIAGNOSIS — J449 Chronic obstructive pulmonary disease, unspecified: Secondary | ICD-10-CM | POA: Diagnosis not present

## 2020-12-11 DIAGNOSIS — I351 Nonrheumatic aortic (valve) insufficiency: Secondary | ICD-10-CM | POA: Diagnosis not present

## 2020-12-11 DIAGNOSIS — I5030 Unspecified diastolic (congestive) heart failure: Secondary | ICD-10-CM | POA: Diagnosis not present

## 2020-12-11 DIAGNOSIS — Z89511 Acquired absence of right leg below knee: Secondary | ICD-10-CM | POA: Diagnosis not present

## 2020-12-11 DIAGNOSIS — E119 Type 2 diabetes mellitus without complications: Secondary | ICD-10-CM | POA: Diagnosis not present

## 2020-12-11 DIAGNOSIS — R7881 Bacteremia: Secondary | ICD-10-CM | POA: Diagnosis not present

## 2020-12-11 DIAGNOSIS — I739 Peripheral vascular disease, unspecified: Secondary | ICD-10-CM | POA: Diagnosis not present

## 2020-12-11 DIAGNOSIS — G479 Sleep disorder, unspecified: Secondary | ICD-10-CM | POA: Diagnosis not present

## 2020-12-15 ENCOUNTER — Encounter: Payer: Self-pay | Admitting: Internal Medicine

## 2020-12-15 ENCOUNTER — Other Ambulatory Visit: Payer: Self-pay

## 2020-12-15 ENCOUNTER — Ambulatory Visit (INDEPENDENT_AMBULATORY_CARE_PROVIDER_SITE_OTHER): Payer: Medicare Other | Admitting: Internal Medicine

## 2020-12-15 ENCOUNTER — Other Ambulatory Visit: Payer: Self-pay | Admitting: Internal Medicine

## 2020-12-15 DIAGNOSIS — J449 Chronic obstructive pulmonary disease, unspecified: Secondary | ICD-10-CM

## 2020-12-15 DIAGNOSIS — J9611 Chronic respiratory failure with hypoxia: Secondary | ICD-10-CM

## 2020-12-15 DIAGNOSIS — R918 Other nonspecific abnormal finding of lung field: Secondary | ICD-10-CM | POA: Diagnosis not present

## 2020-12-15 NOTE — Patient Instructions (Signed)
Plan A = Automatic = Always=    continue breztri Take 2 puffs first thing in am and then another 2 puffs about 12 hours later.     Plan B = Backup (to supplement plan A, not to replace it) Only use your albuterol inhaler as a rescue medication to be used if you can't catch your breath by resting or doing a relaxed purse lip breathing pattern.  - The less you use it, the better it will work when you need it. - Ok to use the inhaler up to 2 puffs  every 4 hours if you must but call for appointment if use goes up over your usual need - Don't leave home without it !!  (think of it like the spare tire for your car)   Plan C = Crisis (instead of Plan B but only if Plan B stops working) - only use your albuterol nebulizer if you first try Plan B and it fails to help > ok to use the nebulizer up to every 4 hours but if start needing it regularly call for immediate appointment    0 2  3lpm 24/7 ok to adjust to keep saturations over 90% is goal   Please schedule a follow up visit in 3 months but call sooner if needed

## 2020-12-15 NOTE — Progress Notes (Signed)
Subjective:   Patient ID: Joel Perez, male    DOB: 13-Dec-1945   MRN: [id number]    Brief patient profile:  87   yowm quit smoking 02/2018/MM  referred by Dr Joel Perez for eval of copd with GOLD II criteria established 02/09/13    History of Present Illness  12/27/2012 1st pulmonary eval/ Joel Perez cc onset x 2009 am cough cough/ congestion and progresive doe with white mucus x sev tbsp x sev min better p proaire and maintained on spiriva which he feels doesn't work as well as advair. Doe x one flight steps, dragging the garbage to street stops x 2. He denies cough to me but actually has a rattling with cough maneuver but doesn't bring up much mucus, what he does bring up is mostly in ams and mucoid and some better since quit smoking  rec symbicort 160 Take 2 puffs first thing in am and then another 2 puffs about 12 hours later.  Only use your albuterol (proaire) as a rescue medication to be used if you can't catch your breath by resting or doing a relaxed purse lip breathing pattern. The less you use it, the better it will work when you need it.  As you improve ok to stop spiriva  Please schedule a follow up office visit in 6 weeks, call sooner if needed with pft's    09/22/2015  f/u ov/Joel Perez re: GOLD III copd/ maint rx = symbicort/ spiriva resp We will be referring you to rehab at [facility]        10/03/2017  f/u ov/Joel Perez re:  GOLD III still smoking  / having trouble affording meds Chief Complaint  Patient presents with   Follow-up    Increased SOB over the past 2 wks. He is using his proair 4-5 x per day and neb once daily on average.  He states he gets SOB walking just short distances. He did try round of pred and this helped some. He has been coughing up some clear sputum.    Dyspnea:  Still doe = MMRC3 = can't walk 100 yards even at a slow pace at a flat grade s stopping due to sob   Cough: clear / thick/ rattling worst in am  Sleep: R side / 2 pillows /no 02  SABA use:  Overusing unless  on prednisone rec Plan A = Automatic =  BEVESP  Take 2 puffs first thing in am and then another 2 puffs about 12 hours later.  Work on maintaining perfect  inhaler technique:   Plan B = Backup Only use your albuterol as a rescue medication Plan C = Crisis - only use your albuterol nebulizer if you first try Plan B and it fails to help > ok to use the nebulizer up to every 4 hours but if start needing it regularly call for immediate appointment Plan D = Deltasone, take x 2 daily until better then 1 daily x 5 days until better and stop  The key is to stop smoking completely before smoking completely stops you!  Please schedule a follow up visit in 3 months but call sooner if needed  with all medications /inhalers/ solutions in hand so we can verify exactly what you are taking. This includes all medications from all doctors and over the counters     07/05/2018  f/u ov/Joel Perez re:  GOLD III / maint on symb 160 2bid  Chief Complaint  Patient presents with   Follow-up    PFT done today.  Breathing has improved some. He uses his albuterol inhaler about once per day. He has not needed neb.   Dyspnea:  Treadmill at 1.5 mph at zero incline x 10 min stopped by back   / some [employer] ok MMRC3 = can't walk 100 yards even at a slow pace at a flat grade s stopping due to sob   Cough: none Sleeping: bed is flat/ 2 big pillows  SABA use: once a day  If has to go out of house for any reason  02: has but not using  rec Plan A = Automatic = symbicort 160 Take 2 puffs first thing in am and then another 2 puffs about 12 hours later.  And add spiriva 2 pffs each am  Work on inhaler technique:  Plan B = Backup Only use your albuterol inhaler as a rescue medication Plan C = Crisis - only use your albuterol nebulizer if you first try Plan B   Place your 0xygen on  when you walk to maintain the saturation above 90% or walk slower      11/14/2018  f/u ov/Joel Perez re:  Copd III no better on 20 mg prednisone daily  finished it and this time says no worse off and doesn't think spiriva helping either  Chief Complaint  Patient presents with   Follow-up    Breathing is about the same. He is using his proair 2-3 x per day and neb 2 x per wk.  Dyspnea:  MMRC3 = can't walk 100 yards even at a slow pace at a flat grade s stopping due to sob   Cough: minimal  Sleeping: on bed / flat/ 2 pillows  SABA use: as above 02: has poc doesn't think it works  rec Try off the spiriva (think of it like high octane fuel) Goal for walking is to keep your 02 sats above 90% when walking    02/13/2019  f/u ov/Joel Perez re: COPD GOLD III / 02 dep with activity  / doe not worse off spiriva/ has pred for prn use  Chief Complaint  Patient presents with   Follow-up  Dyspnea:  Doing gxt x 10 min 1.0 mph flat stops due to hip not sob/ sats ? In 80s but not using 02 correctly   Cough: better  Sleeping: on R side/ 2 pillows  SABA use:  02: not using at hs / using 3lpm cooking / not consistently using while walking  rec Continue symbicort 160  Take 2 puffs first thing in am and then another 2 puffs about 12 hours later.  Be sure to wear 02 2.5-3lpm when walking for exercise to keep the saturations consistently above 90% - this will help your heart and circulation and help you burn fat Please schedule a follow up visit in 3 months but call sooner if needed  Add ? Add breztri next ov ? If insurance will cover   05/21/2019  f/u ov/Joel Perez re:  Copd  III  No longer on spiriva / pred is plan D Chief Complaint  Patient presents with   Follow-up    SOB on extertion. Patient denies any coughing.  Dyspnea:  Doing gxt x 15 min s stopping @ grade = flat at 1.1 mph on 3lpm not checking sats  Cough: no cough  Sleeping: on side / 2 pillows  SABA use: neb before activity 02: 3lpm with activity not checking levels / no using at hs / not using it at rest rec Plan A = Automatic =  Always=    Breztri  Take 2 puffs first thing in am and then another  2 puffs about 12 hours later.  Plan B = Backup (to supplement plan A, not to replace it) Only use your albuterol inhaler as a rescue medication  Plan C = Crisis (instead of Plan B but only if Plan B stops working) - only use your albuterol nebulizer if you first try Plan B Plan D = Prednisone 10 mg  Take 2 daily until better then 1 daily x 5 days and stop Make sure you check your oxygen saturations at highest level of activity to be sure it stays over 90% and adjust upward to maintain this level if needed but remember to turn it back to previous settings when you stop (to conserve your supply)     12/07/2019  f/u ov/Dresden Lozito re:  COPD  III/breztri  Plus saba tid never prechallenge/ prednisone 3 daily "on way down" (this was not rec)  Chief Complaint  Patient presents with   Follow-up  Dyspnea: limited by leg from doing treadmill but walking [place] and stops half way x 20 min loop @ 3lpm sat > 90% lift hip also limiting Cough: better  Sleeping: bed flat/ 2 pillows  SABA use: as above   02: 3lpm  with acitivity only  rec No change in medications  Please schedule a follow up visit in 6  months but call sooner if needed  - [place] office     06/18/2020  f/u ov/[place] office/Dameka Younker re: GOLD III / 02 24/7  breztri 2bid And pred 10 x 2 daily says neveroff prednisone  X  6 months and gaining wt - still some vaping  Chief Complaint  Patient presents with   Follow-up    Pt states breathing has been going downhill x 3 wks. He is getting winded walking room to room. He also c/o weakness. Weight is up since the last visit and has some swelling in his legs. He is using his albuterol 5 x per day on average and neb a couple times per wk.   Dyspnea: room to room  > sats 96% on 3lpm  Cough: none  Sleeping: sleeping in recliner x 45 degrees  SABA use: hfa helps so does neb but not rechallenging  02: 3 - 4lpm 24/7  More swelling than usual assoc with sob  rec Prednisone 10 mg x  2 daily until  better then one daily  Lasix 20 mg x 2 daily untl swelling better then daily  Please remember to go to the  x-ray department at [facility]   > ? Lung nodule When see [facility] you need CMET TSH  BNP and CBC with diff   CT 07/22/20 1. 16 mm mean diameter right upper lobe spiculated nodule, with smaller 7 mm spiculated satellite nodule in the right upper lobe, both concerning for malignancy. PET-CT recommended for further evaluation. 2. Multifocal airspace disease within the right middle and right lower lobes, consistent with pneumonia. 3. Mediastinal and right hilar lymphadenopathy. Metastatic disease cannot be excluded. 4. Nonspecific diffuse esophageal wall thickening, which could reflect esophagitis. 5. Aortic Atherosclerosis (ICD10-I70.0) and Emphysema (ICD10-J43.9).   08/13/2020  f/u ov/[place] office/Leylah Tarnow re:  GOLD II/ 02 dep for ex and hs  Chief Complaint  Patient presents with   Hospital Followup     Breathing improved some but not back to his normal baseline. He has some cough- non prod. He is using his proair 2 x  daily on average and neb with albuterol about 5 x per wk.     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  Plan B = Backup (to supplement plan A, not to replace it) Only use your albuterol inhaler as a rescue medication  Plan C = Crisis (instead of Plan B but only if Plan B stops working) - only use your albuterol nebulizer if you first try Plan B and it fails to help > ok to use the nebulizer up to every 4 hours but if start needing it regularly call for immediate appointment Plan D = Deltasone = prednisone - increase dose if ABC not working well   2 until better then 1 daily  Plan E = ER - go to ER or call 911 if all else fails  Make sure you check your oxygen saturation  at your highest level of activity  to be sure it stays over 90% and adjust  02      12/15/2020  f/u ov/Melanie Pellot re: copd  GOLD III / 02 hs on breztri and pred 10 mg floor  Chief Complaint  Patient presents with   Follow-up    Patient is here for a follow up on COPD and does not have any concerns.    Dyspnea:  bedridden s/p R BKA  Cough: none  Sleeping: 30 degrees hob SABA use: sev times , not neb 02: 3lpm  Covid status:   3 total vaccines   No obvious day to day or daytime variability or assoc excess/ purulent sputum or mucus plugs or hemoptysis or cp or chest tightness, subjective wheeze or overt sinus or hb symptoms.   Sleeping  without nocturnal  or early am exacerbation  of respiratory  c/o's or need for noct saba. Also denies any obvious fluctuation of symptoms with weather or environmental changes or other aggravating or alleviating factors except as outlined above   No unusual exposure hx or h/o childhood pna/ asthma or knowledge of premature birth.  Current Allergies, Complete Past Medical History, Past Surgical History, Family History, and Social History were reviewed in [employer] record.  ROS  The following are not active complaints unless bolded Hoarseness, sore throat, dysphagia, dental problems, itching, sneezing,  nasal congestion or discharge of excess mucus or purulent secretions, ear ache,   fever, chills, sweats, unintended wt loss or wt gain, classically pleuritic or exertional cp,  orthopnea pnd or arm/hand swelling  or leg swelling, presyncope, palpitations, abdominal pain, anorexia, nausea, vomiting, diarrhea  or change in bowel habits or change in bladder habits, change in stools or change in urine, dysuria, hematuria,  rash, arthralgias, visual complaints, headache, numbness, weakness or ataxia or problems with  walking or coordination,  change in mood or  memory.        Current Meds  Medication Sig   acetaminophen (TYLENOL) 325 MG tablet Take 2 tablets (650 mg total) by mouth every 6 (six) hours.   albuterol (PROVENTIL) (2.5 MG/3ML) 0.083% nebulizer solution USE 1 VIAL IN NEBULIZER EVERY 6 HOURS AS NEEDED FOR WHEEZING AND FOR SHORTNESS OF BREATH  aspirin EC 81 MG tablet Take 81 mg by mouth daily. Swallow whole.   atorvastatin (LIPITOR) 40 MG tablet TAKE 1 TABLET BY MOUTH ONCE DAILY . APPOINTMENT REQUIRED FOR FUTURE REFILLS   Budeson-Glycopyrrol-Formoterol (BREZTRI AEROSPHERE) 160-9-4.8 MCG/ACT AERO Take 2 puffs first thing in am and then another 2 puffs about 12 hours later.   ceFAZolin (ANCEF) IVPB Inject 2 g into the vein every 8 (eight) hours. Indication:  osteomyelitis and recurrent MSSA bacteremia First Dose: Yes Last Day of Therapy:  01/08/2021 Labs - Once weekly:  CBC/D and BMP, Labs - Every other week:  ESR and CRP Method of administration: IV Push Method of administration may be changed at the discretion of home infusion pharmacist based upon assessment of the patient and/or caregiver's ability to self-administer the medication ordered.   cyclobenzaprine (FLEXERIL) 10 MG tablet Take 10 mg by mouth at bedtime.   furosemide (LASIX) 20 MG tablet TAKE 1 TABLET BY MOUTH ONCE DAILY   gabapentin (NEURONTIN) 600 MG tablet Take 0.5 tablets (300 mg total) by mouth 2 (two) times daily.   losartan (COZAAR) 25 MG tablet Take 1 tablet (25 mg total) by mouth daily.   Melatonin 10 MG TABS Take 1 tablet by mouth at bedtime.   Multiple Vitamin (MULTIVITAMIN WITH MINERALS) TABS tablet Take 1 tablet by mouth daily.   oxyCODONE (OXY IR/ROXICODONE) 5 MG immediate release tablet Take 1-2 tablets (5-10 mg total) by mouth every 4 (four) hours as needed for severe pain.   pantoprazole (PROTONIX) 40 MG tablet Take 1 tablet (40 mg total) by mouth daily.   polyethylene glycol (MIRALAX / GLYCOLAX) 17 g packet Take 17 g  by mouth daily as needed for mild constipation.   predniSONE (DELTASONE) 10 MG tablet 2 until better then one daily   PROAIR HFA 108 (90 Base) MCG/ACT inhaler INHALE 2 PUFFS BY MOUTH EVERY 6 HOURS AS NEEDED FOR WHEEZING FOR SHORTNESS OF BREATH   sodium chloride (OCEAN) 0.65 % nasal spray Place 1 spray into the nose daily as needed for congestion.   vitamin B-12 (CYANOCOBALAMIN) 1000 MCG tablet Take 1,000 mcg by mouth daily.   vitamin C (ASCORBIC ACID) 500 MG tablet Take 500 mg by mouth daily.                   Objective:   Physical Exam  12/15/2020    212  08/13/2020    249   06/18/2020    243 12/07/2019     227  05/21/2019   234  02/13/2019     227  01/18/2014       179 > 02/07/2015 190 > 03/21/2015   192 > 06/23/2015  196 >   12/30/2015 198 > 03/31/2016 198 >  07/02/2016  201 > 12/30/2016 201 >  04/01/2017   195 >  07/04/2017  201 > 10/03/2017  197 >   01/03/2018  206 > 04/05/2018    206> 05/23/2018  211 > 07/05/2018  217 > 11/14/2018  225     02/09/13 177 lb (80.287 kg)  12/27/12 173 lb 12.8 oz (78.835 kg)        Vital signs reviewed  12/15/2020  - Note at rest 02 sats  97% on 3lpm   General appearance:    chronically ill stretcher bound obese wm nad  HEENT : pt wearing mask not removed for exam due to covid -19 concerns.    NECK :  without JVD/Nodes/TM/ nl carotid upstrokes bilaterally   LUNGS: no  acc muscle use,  Mod barrel  contour chest wall with bilateral  Distant bs s audible wheeze and  without cough on insp or exp maneuvers and mod  Hyperresonant  to  percussion bilaterally     CV:  RRR  no s3 or murmur or increase in P2, and no edema   ABD:  obese soft and nontender with pos mid insp Hoover's  in the supine position. No bruits or organomegaly appreciated, bowel sounds nl  MS:     ext warm with R BKA, calf tenderness, cyanosis or clubbing No obvious joint restrictions   SKIN: warm and dry without lesions    NEURO:  alert, approp, nl sensorium with  no motor or cerebellar  deficits apparent.             I personally reviewed images and agree with radiology impression as follows:  CXR:   11/27/20  Persistent opacities at the right lung base and right lung nodules. Increased left basilar atelectasis/consolidation. Small bilateral pleural effusions.             Assessment & Plan:

## 2020-12-16 ENCOUNTER — Encounter: Payer: Self-pay | Admitting: Internal Medicine

## 2020-12-16 ENCOUNTER — Ambulatory Visit: Payer: Medicare Other

## 2020-12-16 DIAGNOSIS — I739 Peripheral vascular disease, unspecified: Secondary | ICD-10-CM | POA: Diagnosis not present

## 2020-12-16 DIAGNOSIS — I351 Nonrheumatic aortic (valve) insufficiency: Secondary | ICD-10-CM | POA: Diagnosis not present

## 2020-12-16 DIAGNOSIS — I5030 Unspecified diastolic (congestive) heart failure: Secondary | ICD-10-CM | POA: Diagnosis not present

## 2020-12-16 DIAGNOSIS — I10 Essential (primary) hypertension: Secondary | ICD-10-CM | POA: Diagnosis not present

## 2020-12-16 DIAGNOSIS — J449 Chronic obstructive pulmonary disease, unspecified: Secondary | ICD-10-CM | POA: Diagnosis not present

## 2020-12-16 DIAGNOSIS — K5900 Constipation, unspecified: Secondary | ICD-10-CM | POA: Diagnosis not present

## 2020-12-16 DIAGNOSIS — G629 Polyneuropathy, unspecified: Secondary | ICD-10-CM | POA: Diagnosis not present

## 2020-12-16 NOTE — Assessment & Plan Note (Addendum)
Quit smoking 02/2018  - MM phenotype  - 02/09/2013  FEV1 2.14 (68%) ratio 60 p 30% better from B2 and dloc 52 corrects to 69 - 02/07/2015  extensive coaching HFA effectiveness =    90% from a baseline of 75%  - 02/07/2015  Walked RA x 3 laps @ 185 ft each stopped due to  End of study, nl pace, no sob or desat   - PFT's  03/21/2015  FEV1 1.92 (62 % ) ratio 60  p 14 % improvement from saba with DLCO  47 % corrects to 62 % for alv volume   - trial of spiriva respimat 03/21/2015  With goal to be able to walk up inclines > no better 06/23/2015 > try bevespi 2 bid x 3 weeks  -sob   Worse p 3 weeks bevespi  so resumed symb/spirva 06/25/2015  - referred to rehab 09/22/2015 >>> d/c'd p 19 sessions on 01/22/16 due to costs - 03/31/2016 rec pred x 6 days if needed  - 12/30/2016    restart spiriva 2.5 x 2 each am > could not afford it  - 04/01/2017    try bevespi  - 07/04/2017    continue beveslop and pred as Plan D but misunderstood and took daily  - 01/03/2018 daliresp trial in effort to reduced pred dependency > could not afford  - quit smoking again 02/2018  - 05/23/2018  After extensive coaching inhaler device,  effectiveness =    90% from a baseline of 75% > try symb 160 again as seems much better while on prednisone and if worse then add back spiriva  - Alpha one AT screen 05/23/2018   MM level 125  - PFT's  07/05/2018  FEV1 1.29 (43 % ) ratio 0.49  p 36 % improvement from saba p symb 160 x 2  prior to study with DLCO  48 % corrects to 67  % for alv volume   - 07/05/2018    try spiriva 2.5 x 2 puffs each am > no better on it 11/14/2018 so rec trial off  - 11/14/2018   Continue symbicort 160 2bid only plus prednisone as plan D - 05/21/2019  After extensive coaching inhaler device,  effectiveness =    90% so try breztri  - 06/18/20 changed prednisone to ceiling of 20 mg and floor of 10 mg    Group D in terms of symptom/risk and laba/lama/ICS  therefore appropriate rx at this point >>>  Continue breztri and prn saba with  prednisone floor of 10 mg / day ok to double for flare.

## 2020-12-16 NOTE — Assessment & Plan Note (Signed)
See CT chest 07/22/20   Little to offer given his present state of debilitation as he is clearly not a candidate for any form of lung surgery or even attempt at bx   Eventually will need outpt PET once rehabs from the amputation  F/u  p d/c or in 3 m, whichever comes first.          Each maintenance medication was reviewed in detail including emphasizing most importantly the difference between maintenance and prns and under what circumstances the prns are to be triggered using an action plan format where appropriate.  Total time for H and P, chart review, counseling, reviewing hfa/neb/02 device(s) and generating customized AVS unique to this office visit / same day charting  > 30 min

## 2020-12-16 NOTE — Assessment & Plan Note (Signed)
Started on 02 with amb 2019 05/23/2018   Walked RA x one lap = 210 ft - stopped due to  desats to 88% then walked second lap on 3lpm POC and sats 89% at end , slow pace  - 11/14/2018 Patient Saturations on Room Air at Rest = 96  %   while Ambulating = 88%   Then  2  Liters of oxygen while Ambulating = 94% - 02/13/2019   Walked RA x one lap =  approx 250 ft - stopped due to  desats to 88% placed on 2.5 lpm and then completed second lap s desats  Well compensated on 3lpm NP > no change rx

## 2020-12-17 ENCOUNTER — Telehealth: Payer: Self-pay

## 2020-12-17 NOTE — Telephone Encounter (Signed)
Received call from Kathleen at [facility] asking about lab orders as they did not receive any and she wanted to know if our office needed any lab work done for the patient.   RN relayed verbal orders per Greg Calone, NP [facility] orders from 11/28/20 note for weekly CBC with differential, BMP, CRP, and ESR. Requested that labs be faxed to our office.   Relayed follow up appointment time and location to Kathleen so that she can arrange transportation.    D , RN  

## 2020-12-18 DIAGNOSIS — L98419 Non-pressure chronic ulcer of buttock with unspecified severity: Secondary | ICD-10-CM | POA: Diagnosis not present

## 2020-12-22 DIAGNOSIS — I351 Nonrheumatic aortic (valve) insufficiency: Secondary | ICD-10-CM | POA: Diagnosis not present

## 2020-12-22 DIAGNOSIS — I5030 Unspecified diastolic (congestive) heart failure: Secondary | ICD-10-CM | POA: Diagnosis not present

## 2020-12-22 DIAGNOSIS — J449 Chronic obstructive pulmonary disease, unspecified: Secondary | ICD-10-CM | POA: Diagnosis not present

## 2020-12-22 DIAGNOSIS — Z89511 Acquired absence of right leg below knee: Secondary | ICD-10-CM | POA: Diagnosis not present

## 2020-12-22 DIAGNOSIS — R7881 Bacteremia: Secondary | ICD-10-CM | POA: Diagnosis not present

## 2020-12-22 DIAGNOSIS — I739 Peripheral vascular disease, unspecified: Secondary | ICD-10-CM | POA: Diagnosis not present

## 2020-12-22 DIAGNOSIS — J9611 Chronic respiratory failure with hypoxia: Secondary | ICD-10-CM | POA: Diagnosis not present

## 2020-12-22 DIAGNOSIS — I10 Essential (primary) hypertension: Secondary | ICD-10-CM | POA: Diagnosis not present

## 2020-12-25 DIAGNOSIS — L98419 Non-pressure chronic ulcer of buttock with unspecified severity: Secondary | ICD-10-CM | POA: Diagnosis not present

## 2020-12-26 ENCOUNTER — Other Ambulatory Visit: Payer: Self-pay

## 2020-12-26 ENCOUNTER — Ambulatory Visit (INDEPENDENT_AMBULATORY_CARE_PROVIDER_SITE_OTHER): Payer: Medicare Other | Admitting: Internal Medicine

## 2020-12-26 ENCOUNTER — Encounter: Payer: Self-pay | Admitting: Internal Medicine

## 2020-12-26 VITALS — BP 136/75 | HR 88 | Temp 98.0°F

## 2020-12-26 DIAGNOSIS — B9561 Methicillin susceptible Staphylococcus aureus infection as the cause of diseases classified elsewhere: Secondary | ICD-10-CM

## 2020-12-26 DIAGNOSIS — Z452 Encounter for adjustment and management of vascular access device: Secondary | ICD-10-CM | POA: Diagnosis not present

## 2020-12-26 DIAGNOSIS — R7881 Bacteremia: Secondary | ICD-10-CM | POA: Diagnosis not present

## 2020-12-26 DIAGNOSIS — M869 Osteomyelitis, unspecified: Secondary | ICD-10-CM | POA: Diagnosis not present

## 2020-12-26 NOTE — Assessment & Plan Note (Signed)
Patient currently at skilled nursing facility and receiving cefazolin via PICC line.  No new issues or complaints.  He will complete 6 weeks of therapy from date of his BKA on 01/08/2021.  His PICC line can be discontinued at the end of therapy and he can follow-up as needed.

## 2020-12-26 NOTE — Progress Notes (Signed)
[facility] for Infectious Disease  CHIEF COMPLAINT:    Follow up for MSSA bacteremia  SUBJECTIVE:    Joel Perez is a 75 y.o. male with PMHx as below who presents to the clinic for MSSA bacteremia.   Patient was hospitalized 11/23/2020 to 12/09/2020.  At that time he was found to have a nonhealing right fifth toe wound/OM and resultant MSSA bacteremia that had recurred.  He underwent right BKA 11/27/2020.  He was unable to go undergo TEE due to tenuous respiratory status and so he was treated for presumptive endocarditis in the setting of recurrence of his bacteremia.  He is planned to complete cefazolin via PICC line until 01/08/2021.  He was discharged to a skilled nursing facility on 7/19.  Please see A&P for the details of today's visit and status of the patient's medical problems.   Patient's Medications  New Prescriptions   No medications on file  Previous Medications   ACETAMINOPHEN (TYLENOL) 325 MG TABLET    Take 2 tablets (650 mg total) by mouth every 6 (six) hours.   ALBUTEROL (PROVENTIL) (2.5 MG/3ML) 0.083% NEBULIZER SOLUTION    USE 1 VIAL IN NEBULIZER EVERY 6 HOURS AS NEEDED FOR WHEEZING AND FOR SHORTNESS OF BREATH   ASPIRIN EC 81 MG TABLET    Take 81 mg by mouth daily. Swallow whole.   ATORVASTATIN (LIPITOR) 40 MG TABLET    TAKE 1 TABLET BY MOUTH ONCE DAILY . APPOINTMENT REQUIRED FOR FUTURE REFILLS   BUDESON-GLYCOPYRROL-FORMOTEROL (BREZTRI AEROSPHERE) 160-9-4.8 MCG/ACT AERO    Take 2 puffs first thing in am and then another 2 puffs about 12 hours later.   CEFAZOLIN (ANCEF) IVPB    Inject 2 g into the vein every 8 (eight) hours. Indication:  osteomyelitis and recurrent MSSA bacteremia First Dose: Yes Last Day of Therapy:  01/08/2021 Labs - Once weekly:  CBC/D and BMP, Labs - Every other week:  ESR and CRP Method of administration: IV Push Method of administration may be changed at the discretion of home infusion pharmacist based upon assessment of the patient  and/or caregiver's ability to self-administer the medication ordered.   CYCLOBENZAPRINE (FLEXERIL) 10 MG TABLET    Take 10 mg by mouth at bedtime.   FUROSEMIDE (LASIX) 20 MG TABLET    TAKE 1 TABLET BY MOUTH ONCE DAILY   GABAPENTIN (NEURONTIN) 600 MG TABLET    Take 0.5 tablets (300 mg total) by mouth 2 (two) times daily.   LOSARTAN (COZAAR) 25 MG TABLET    Take 1 tablet (25 mg total) by mouth daily.   MELATONIN 10 MG TABS    Take 1 tablet by mouth at bedtime.   MULTIPLE VITAMIN (MULTIVITAMIN WITH MINERALS) TABS TABLET    Take 1 tablet by mouth daily.   OXYCODONE (OXY IR/ROXICODONE) 5 MG IMMEDIATE RELEASE TABLET    Take 1-2 tablets (5-10 mg total) by mouth every 4 (four) hours as needed for severe pain.   PANTOPRAZOLE (PROTONIX) 40 MG TABLET    Take 1 tablet (40 mg total) by mouth daily.   POLYETHYLENE GLYCOL (MIRALAX / GLYCOLAX) 17 G PACKET    Take 17 g by mouth daily as needed for mild constipation.   PREDNISONE (DELTASONE) 10 MG TABLET    2 until better then one daily   PROAIR HFA 108 (90 BASE) MCG/ACT INHALER    INHALE 2 PUFFS BY MOUTH EVERY 6 HOURS AS NEEDED FOR WHEEZING FOR SHORTNESS OF BREATH   SODIUM  CHLORIDE (OCEAN) 0.65 % NASAL SPRAY    Place 1 spray into the nose daily as needed for congestion.   VITAMIN B-12 (CYANOCOBALAMIN) 1000 MCG TABLET    Take 1,000 mcg by mouth daily.   VITAMIN C (ASCORBIC ACID) 500 MG TABLET    Take 500 mg by mouth daily.  Modified Medications   No medications on file  Discontinued Medications   No medications on file      Past Medical History:  Diagnosis Date   Blind right eye 1985   Following a work accident   BPH (benign prostatic hyperplasia)    Capsular cataract of left eye    COPD (chronic obstructive pulmonary disease) ([place])    Dr. [name]   Current smoker    Long-term smoker. Not interested in quitting   Diverticulosis    With intermittent diverticulitis   Erectile dysfunction    Essential hypertension    Several recorded blood pressures  greater than 140/90.   H/O mitral valve prolapse    By report, but not confirmed by echo.   Low back pain    Chronic. Followed by Dr. Suella Broad   Mild aortic regurgitation 12/2013   Mild to moderate aortic regurgitation on echo   Neuropathy     Social History   Tobacco Use   Smoking status: Former    Packs/day: 2.00    Years: 50.00    Pack years: 100.00    Types: Cigarettes    Quit date: 03/16/2018    Years since quitting: 2.7   Smokeless tobacco: Never  Vaping Use   Vaping Use: Some days   Start date: 03/16/2018  Substance Use Topics   Alcohol use: Yes    Alcohol/week: 0.0 standard drinks   Drug use: No    Family History  Problem Relation Age of Onset   Heart disease Father    Breast cancer Mother    Other Sister    Brain cancer Brother     Allergies  Allergen Reactions   Other Swelling and Other (See Comments)    Farmed Fish (tightness in throat & lip swelling)   Codeine Rash   Sulfa Antibiotics Hives    Review of Systems  Constitutional: Negative.   Respiratory: Negative.    Cardiovascular: Negative.   Musculoskeletal: Negative.   Skin: Negative.     OBJECTIVE:    Vitals:   12/26/20 0918  BP: 136/75  Pulse: 88  Temp: 98 F (36.7 C)  TempSrc: Oral  SpO2: 97%   There is no height or weight on file to calculate BMI.  Physical Exam Constitutional:      General: He is not in acute distress.    Appearance: Normal appearance.     Comments: Lying in stretcher.   HENT:     Head: Normocephalic and atraumatic.  Eyes:     Extraocular Movements: Extraocular movements intact.     Conjunctiva/sclera: Conjunctivae normal.  Pulmonary:     Effort: Pulmonary effort is normal. No respiratory distress.  Abdominal:     General: There is no distension.     Palpations: Abdomen is soft.     Tenderness: There is no abdominal tenderness.  Musculoskeletal:     Comments: S/p BKA  Skin:    General: Skin is warm and dry.  Neurological:     General: No  focal deficit present.     Mental Status: He is alert. Mental status is at baseline.  Psychiatric:        Mood  and Affect: Mood normal.        Behavior: Behavior normal.     Labs and Microbiology: CBC Latest Ref Rng & Units 11/29/2020 11/28/2020 11/27/2020  WBC 4.0 - 10.5 K/uL 19.6(H) 23.3(H) 19.9(H)  Hemoglobin 13.0 - 17.0 g/dL 10.0(L) 10.3(L) 10.7(L)  Hematocrit 39.0 - 52.0 % 30.4(L) 32.7(L) 33.1(L)  Platelets 150 - 400 K/uL 481(H) 510(H) 509(H)   CMP Latest Ref Rng & Units 12/07/2020 11/30/2020 11/29/2020  Glucose 70 - 99 mg/dL - 92 96  BUN 8 - 23 mg/dL - 15 13  Creatinine 0.61 - 1.24 mg/dL 0.76 0.77 0.71  Sodium 135 - 145 mmol/L - 138 137  Potassium 3.5 - 5.1 mmol/L - 3.9 4.2  Chloride 98 - 111 mmol/L - 103 104  CO2 22 - 32 mmol/L - 29 30  Calcium 8.9 - 10.3 mg/dL - 7.9(L) 8.1(L)  Total Protein 6.5 - 8.1 g/dL - 5.0(L) -  Total Bilirubin 0.3 - 1.2 mg/dL - 0.5 -  Alkaline Phos 38 - 126 U/L - 49 -  AST 15 - 41 U/L - 38 -  ALT 0 - 44 U/L - 16 -        ASSESSMENT & PLAN:    MSSA bacteremia Patient currently at skilled nursing facility and receiving cefazolin via PICC line.  No new issues or complaints.  He will complete 6 weeks of therapy from date of his BKA on 01/08/2021.  His PICC line can be discontinued at the end of therapy and he can follow-up as needed.  Foot osteomyelitis, right ([place]) Status post right BKA 11/27/2020 for definitive source control.          [facility] 12/26/2020, 9:32 AM  I spent 30 minutes dedicated to the care of this patient on the date of this encounter to include pre-visit review of records, face-to-face time with the patient discussing MSSA bacteremia and OM, and post-visit ordering of testing.

## 2020-12-26 NOTE — Patient Instructions (Signed)
Continue IV cefazolin via PICC line through 01/08/21 then can be removed Continue with weekly lab monitoring while on IV antibiotics Follow up with vascular surgery as planned Follow up with me as needed.

## 2020-12-26 NOTE — Assessment & Plan Note (Signed)
Status post right BKA 11/27/2020 for definitive source control.

## 2020-12-29 DIAGNOSIS — I5030 Unspecified diastolic (congestive) heart failure: Secondary | ICD-10-CM | POA: Diagnosis not present

## 2020-12-29 DIAGNOSIS — M869 Osteomyelitis, unspecified: Secondary | ICD-10-CM | POA: Diagnosis not present

## 2020-12-29 DIAGNOSIS — G629 Polyneuropathy, unspecified: Secondary | ICD-10-CM | POA: Diagnosis not present

## 2020-12-29 DIAGNOSIS — J449 Chronic obstructive pulmonary disease, unspecified: Secondary | ICD-10-CM | POA: Diagnosis not present

## 2020-12-29 DIAGNOSIS — I739 Peripheral vascular disease, unspecified: Secondary | ICD-10-CM | POA: Diagnosis not present

## 2020-12-29 DIAGNOSIS — A419 Sepsis, unspecified organism: Secondary | ICD-10-CM | POA: Diagnosis not present

## 2020-12-31 DIAGNOSIS — M869 Osteomyelitis, unspecified: Secondary | ICD-10-CM | POA: Diagnosis not present

## 2020-12-31 DIAGNOSIS — Z452 Encounter for adjustment and management of vascular access device: Secondary | ICD-10-CM | POA: Diagnosis not present

## 2020-12-31 DIAGNOSIS — Z89511 Acquired absence of right leg below knee: Secondary | ICD-10-CM | POA: Diagnosis not present

## 2020-12-31 DIAGNOSIS — Z9981 Dependence on supplemental oxygen: Secondary | ICD-10-CM | POA: Diagnosis not present

## 2020-12-31 DIAGNOSIS — Z9181 History of falling: Secondary | ICD-10-CM | POA: Diagnosis not present

## 2020-12-31 DIAGNOSIS — Z4781 Encounter for orthopedic aftercare following surgical amputation: Secondary | ICD-10-CM | POA: Diagnosis not present

## 2020-12-31 DIAGNOSIS — J449 Chronic obstructive pulmonary disease, unspecified: Secondary | ICD-10-CM | POA: Diagnosis not present

## 2020-12-31 DIAGNOSIS — I119 Hypertensive heart disease without heart failure: Secondary | ICD-10-CM | POA: Diagnosis not present

## 2020-12-31 DIAGNOSIS — E1169 Type 2 diabetes mellitus with other specified complication: Secondary | ICD-10-CM | POA: Diagnosis not present

## 2020-12-31 DIAGNOSIS — E1151 Type 2 diabetes mellitus with diabetic peripheral angiopathy without gangrene: Secondary | ICD-10-CM | POA: Diagnosis not present

## 2020-12-31 DIAGNOSIS — B9561 Methicillin susceptible Staphylococcus aureus infection as the cause of diseases classified elsewhere: Secondary | ICD-10-CM | POA: Diagnosis not present

## 2020-12-31 DIAGNOSIS — Z7982 Long term (current) use of aspirin: Secondary | ICD-10-CM | POA: Diagnosis not present

## 2021-01-01 DIAGNOSIS — Z89511 Acquired absence of right leg below knee: Secondary | ICD-10-CM | POA: Diagnosis not present

## 2021-01-01 DIAGNOSIS — M869 Osteomyelitis, unspecified: Secondary | ICD-10-CM | POA: Diagnosis not present

## 2021-01-01 DIAGNOSIS — E1169 Type 2 diabetes mellitus with other specified complication: Secondary | ICD-10-CM | POA: Diagnosis not present

## 2021-01-01 DIAGNOSIS — Z4781 Encounter for orthopedic aftercare following surgical amputation: Secondary | ICD-10-CM | POA: Diagnosis not present

## 2021-01-01 DIAGNOSIS — B9561 Methicillin susceptible Staphylococcus aureus infection as the cause of diseases classified elsewhere: Secondary | ICD-10-CM | POA: Diagnosis not present

## 2021-01-02 DIAGNOSIS — Z89511 Acquired absence of right leg below knee: Secondary | ICD-10-CM | POA: Diagnosis not present

## 2021-01-02 DIAGNOSIS — B9561 Methicillin susceptible Staphylococcus aureus infection as the cause of diseases classified elsewhere: Secondary | ICD-10-CM | POA: Diagnosis not present

## 2021-01-02 DIAGNOSIS — M869 Osteomyelitis, unspecified: Secondary | ICD-10-CM | POA: Diagnosis not present

## 2021-01-02 DIAGNOSIS — E1169 Type 2 diabetes mellitus with other specified complication: Secondary | ICD-10-CM | POA: Diagnosis not present

## 2021-01-02 DIAGNOSIS — Z4781 Encounter for orthopedic aftercare following surgical amputation: Secondary | ICD-10-CM | POA: Diagnosis not present

## 2021-01-02 DIAGNOSIS — M86172 Other acute osteomyelitis, left ankle and foot: Secondary | ICD-10-CM | POA: Diagnosis not present

## 2021-01-06 ENCOUNTER — Other Ambulatory Visit: Payer: Self-pay

## 2021-01-06 ENCOUNTER — Ambulatory Visit (INDEPENDENT_AMBULATORY_CARE_PROVIDER_SITE_OTHER): Payer: Medicare Other | Admitting: Physician Assistant

## 2021-01-06 VITALS — BP 124/65 | HR 67 | Temp 97.8°F

## 2021-01-06 DIAGNOSIS — I739 Peripheral vascular disease, unspecified: Secondary | ICD-10-CM

## 2021-01-06 DIAGNOSIS — Z89511 Acquired absence of right leg below knee: Secondary | ICD-10-CM

## 2021-01-06 NOTE — Progress Notes (Signed)
POST OPERATIVE OFFICE NOTE    CC:  F/u for surgery  HPI:  This is a 75 y.o. male who is s/p right BKA on 11/27/2020 by Dr. Stanford Breed.  He states he spends all of his time in chair.  He is expecting PT today and still awaiting delivery of DME. His wife accompanies him. He has history of chronic LE edema and is on Lasix.  On abx via right PICC per Dr. Juleen China for MSSA bacteremia.  Allergies  Allergen Reactions   Other Swelling and Other (See Comments)    Farmed Fish (tightness in throat & lip swelling)   Codeine Rash   Sulfa Antibiotics Hives    Current Outpatient Medications  Medication Sig Dispense Refill   acetaminophen (TYLENOL) 325 MG tablet Take 2 tablets (650 mg total) by mouth every 6 (six) hours.     albuterol (PROVENTIL) (2.5 MG/3ML) 0.083% nebulizer solution USE 1 VIAL IN NEBULIZER EVERY 6 HOURS AS NEEDED FOR WHEEZING AND FOR SHORTNESS OF BREATH 150 mL 2   aspirin EC 81 MG tablet Take 81 mg by mouth daily. Swallow whole.     atorvastatin (LIPITOR) 40 MG tablet TAKE 1 TABLET BY MOUTH ONCE DAILY . APPOINTMENT REQUIRED FOR FUTURE REFILLS 30 tablet 5   Budeson-Glycopyrrol-Formoterol (BREZTRI AEROSPHERE) 160-9-4.8 MCG/ACT AERO Take 2 puffs first thing in am and then another 2 puffs about 12 hours later. 10.7 g 11   ceFAZolin (ANCEF) IVPB Inject 2 g into the vein every 8 (eight) hours. Indication:  osteomyelitis and recurrent MSSA bacteremia First Dose: Yes Last Day of Therapy:  01/08/2021 Labs - Once weekly:  CBC/D and BMP, Labs - Every other week:  ESR and CRP Method of administration: IV Push Method of administration may be changed at the discretion of home infusion pharmacist based upon assessment of the patient and/or caregiver's ability to self-administer the medication ordered. 114 Units 0   cyclobenzaprine (FLEXERIL) 10 MG tablet Take 10 mg by mouth at bedtime.     furosemide (LASIX) 20 MG tablet TAKE 1 TABLET BY MOUTH ONCE DAILY 30 tablet 11   gabapentin (NEURONTIN) 600 MG  tablet Take 0.5 tablets (300 mg total) by mouth 2 (two) times daily. 6 tablet 0   losartan (COZAAR) 25 MG tablet Take 1 tablet (25 mg total) by mouth daily.     Melatonin 10 MG TABS Take 1 tablet by mouth at bedtime.     Multiple Vitamin (MULTIVITAMIN WITH MINERALS) TABS tablet Take 1 tablet by mouth daily.     oxyCODONE (OXY IR/ROXICODONE) 5 MG immediate release tablet Take 1-2 tablets (5-10 mg total) by mouth every 4 (four) hours as needed for severe pain. 30 tablet 0   pantoprazole (PROTONIX) 40 MG tablet Take 1 tablet (40 mg total) by mouth daily.     polyethylene glycol (MIRALAX / GLYCOLAX) 17 g packet Take 17 g by mouth daily as needed for mild constipation. 14 each 0   predniSONE (DELTASONE) 10 MG tablet 2 until better then one daily 100 tablet 2   PROAIR HFA 108 (90 Base) MCG/ACT inhaler INHALE 2 PUFFS BY MOUTH EVERY 6 HOURS AS NEEDED FOR WHEEZING FOR SHORTNESS OF BREATH 18 g 7   sodium chloride (OCEAN) 0.65 % nasal spray Place 1 spray into the nose daily as needed for congestion.     vitamin B-12 (CYANOCOBALAMIN) 1000 MCG tablet Take 1,000 mcg by mouth daily.     vitamin C (ASCORBIC ACID) 500 MG tablet Take 500 mg by mouth daily.  No current facility-administered medications for this visit.     ROS:  See HPI  BP 124/65 (BP Location: Right Arm, Patient Position: Sitting, Cuff Size: Normal)   Pulse 67   Temp 97.8 F (36.6 C) (Tympanic)   SpO2 100%   Physical Exam:  General appearance: Awake, alert in no apparent distress Cardiac: Heart rate and rhythm are regular Respirations: Nonlabored Extremities: Amputation site incision is well approximated without bleeding or hematoma.  He does have significant edema of his residual limb. Anterior and posterior flaps are warm and well-perfused .   Assessment/Plan:  This is a 75 y.o. male who is s/p:right BKA. Staples out today. Applied ACE wrap. I advised regarding proper elevation of lower extremities.  Continue asa and statin  indefinitely Follow-up in 6 months with ABIs. Refer to [employer]. Call for LE pain or non-healing wounds.  The patient has a right Below Knee Amputation. The patient is well motivated to return to their prior functional status by utilizing a prosthesis to perform ADL's and maintain a healthy lifestyle. The patient has the physical and cognitive capacity to function with a prosthesis.   Functional Level: [place] [employer] [place] Ambulator: Has the ability or potential for ambulation and to traverse low environmental barriers such as curbs, stairs or uneven surfaces  Residual Limb History: The skin condition of the residual limb is currently somewhat fragile due to edema. The patient will continue to monitor the skin of the residual limb and follow hygiene instructions. Verbal and written information given regarding elevation of LE. The patient is experiencing residual limb pain  Prosthetic Prescription Plan: Counseling and education regarding prosthetic management will be provided to the patient via a certified prosthetist. A multi-discipline team, including physical therapy, will manage the prosthetic fabrication, fitting and prosthetic gait training.     Risa Grill, PA-C [facility] [phone]  Clinic MD:  Stanford Breed

## 2021-01-07 ENCOUNTER — Other Ambulatory Visit: Payer: Self-pay

## 2021-01-07 DIAGNOSIS — Z89511 Acquired absence of right leg below knee: Secondary | ICD-10-CM | POA: Diagnosis not present

## 2021-01-07 DIAGNOSIS — B9561 Methicillin susceptible Staphylococcus aureus infection as the cause of diseases classified elsewhere: Secondary | ICD-10-CM | POA: Diagnosis not present

## 2021-01-07 DIAGNOSIS — Z4781 Encounter for orthopedic aftercare following surgical amputation: Secondary | ICD-10-CM | POA: Diagnosis not present

## 2021-01-07 DIAGNOSIS — A419 Sepsis, unspecified organism: Secondary | ICD-10-CM | POA: Diagnosis not present

## 2021-01-07 DIAGNOSIS — E1169 Type 2 diabetes mellitus with other specified complication: Secondary | ICD-10-CM | POA: Diagnosis not present

## 2021-01-07 DIAGNOSIS — I739 Peripheral vascular disease, unspecified: Secondary | ICD-10-CM

## 2021-01-07 DIAGNOSIS — M869 Osteomyelitis, unspecified: Secondary | ICD-10-CM | POA: Diagnosis not present

## 2021-01-08 DIAGNOSIS — B9561 Methicillin susceptible Staphylococcus aureus infection as the cause of diseases classified elsewhere: Secondary | ICD-10-CM | POA: Diagnosis not present

## 2021-01-08 DIAGNOSIS — J9611 Chronic respiratory failure with hypoxia: Secondary | ICD-10-CM | POA: Diagnosis not present

## 2021-01-08 DIAGNOSIS — Z4781 Encounter for orthopedic aftercare following surgical amputation: Secondary | ICD-10-CM | POA: Diagnosis not present

## 2021-01-08 DIAGNOSIS — E1169 Type 2 diabetes mellitus with other specified complication: Secondary | ICD-10-CM | POA: Diagnosis not present

## 2021-01-08 DIAGNOSIS — M86172 Other acute osteomyelitis, left ankle and foot: Secondary | ICD-10-CM | POA: Diagnosis not present

## 2021-01-08 DIAGNOSIS — I739 Peripheral vascular disease, unspecified: Secondary | ICD-10-CM | POA: Diagnosis not present

## 2021-01-08 DIAGNOSIS — Z89511 Acquired absence of right leg below knee: Secondary | ICD-10-CM | POA: Diagnosis not present

## 2021-01-08 DIAGNOSIS — M869 Osteomyelitis, unspecified: Secondary | ICD-10-CM | POA: Diagnosis not present

## 2021-01-09 ENCOUNTER — Encounter: Payer: Self-pay | Admitting: Internal Medicine

## 2021-01-09 DIAGNOSIS — T8189XA Other complications of procedures, not elsewhere classified, initial encounter: Secondary | ICD-10-CM | POA: Diagnosis not present

## 2021-01-09 DIAGNOSIS — L89892 Pressure ulcer of other site, stage 2: Secondary | ICD-10-CM | POA: Diagnosis not present

## 2021-01-09 DIAGNOSIS — Z4781 Encounter for orthopedic aftercare following surgical amputation: Secondary | ICD-10-CM | POA: Diagnosis not present

## 2021-01-09 DIAGNOSIS — Z89511 Acquired absence of right leg below knee: Secondary | ICD-10-CM | POA: Diagnosis not present

## 2021-01-09 DIAGNOSIS — M869 Osteomyelitis, unspecified: Secondary | ICD-10-CM | POA: Diagnosis not present

## 2021-01-09 DIAGNOSIS — B9561 Methicillin susceptible Staphylococcus aureus infection as the cause of diseases classified elsewhere: Secondary | ICD-10-CM | POA: Diagnosis not present

## 2021-01-09 DIAGNOSIS — E1169 Type 2 diabetes mellitus with other specified complication: Secondary | ICD-10-CM | POA: Diagnosis not present

## 2021-01-09 DIAGNOSIS — L89152 Pressure ulcer of sacral region, stage 2: Secondary | ICD-10-CM | POA: Diagnosis not present

## 2021-01-12 ENCOUNTER — Telehealth: Payer: Self-pay | Admitting: Pharmacist

## 2021-01-12 DIAGNOSIS — Z4781 Encounter for orthopedic aftercare following surgical amputation: Secondary | ICD-10-CM | POA: Diagnosis not present

## 2021-01-12 DIAGNOSIS — B9561 Methicillin susceptible Staphylococcus aureus infection as the cause of diseases classified elsewhere: Secondary | ICD-10-CM | POA: Diagnosis not present

## 2021-01-12 DIAGNOSIS — E1169 Type 2 diabetes mellitus with other specified complication: Secondary | ICD-10-CM | POA: Diagnosis not present

## 2021-01-12 DIAGNOSIS — Z89511 Acquired absence of right leg below knee: Secondary | ICD-10-CM | POA: Diagnosis not present

## 2021-01-12 DIAGNOSIS — M869 Osteomyelitis, unspecified: Secondary | ICD-10-CM | POA: Diagnosis not present

## 2021-01-12 NOTE — Telephone Encounter (Signed)
FYI: [name], RN with [facility] called to confirm pull PICC orders for Joel Perez. Per Dr. Alcario Drought note on 12/26/20, stated ok to pull PICC and no labs needed today. Patient does not have follow-up with ID scheduled since he is following up with vascular surgery.

## 2021-01-13 DIAGNOSIS — I5032 Chronic diastolic (congestive) heart failure: Secondary | ICD-10-CM | POA: Diagnosis not present

## 2021-01-13 DIAGNOSIS — J9611 Chronic respiratory failure with hypoxia: Secondary | ICD-10-CM | POA: Diagnosis not present

## 2021-01-13 DIAGNOSIS — B9561 Methicillin susceptible Staphylococcus aureus infection as the cause of diseases classified elsewhere: Secondary | ICD-10-CM | POA: Diagnosis not present

## 2021-01-13 DIAGNOSIS — Z8739 Personal history of other diseases of the musculoskeletal system and connective tissue: Secondary | ICD-10-CM | POA: Diagnosis not present

## 2021-01-13 DIAGNOSIS — E1169 Type 2 diabetes mellitus with other specified complication: Secondary | ICD-10-CM | POA: Diagnosis not present

## 2021-01-13 DIAGNOSIS — J449 Chronic obstructive pulmonary disease, unspecified: Secondary | ICD-10-CM | POA: Diagnosis not present

## 2021-01-13 DIAGNOSIS — M869 Osteomyelitis, unspecified: Secondary | ICD-10-CM | POA: Diagnosis not present

## 2021-01-13 DIAGNOSIS — Z4781 Encounter for orthopedic aftercare following surgical amputation: Secondary | ICD-10-CM | POA: Diagnosis not present

## 2021-01-13 DIAGNOSIS — Z89511 Acquired absence of right leg below knee: Secondary | ICD-10-CM | POA: Diagnosis not present

## 2021-01-14 DIAGNOSIS — Z4781 Encounter for orthopedic aftercare following surgical amputation: Secondary | ICD-10-CM | POA: Diagnosis not present

## 2021-01-14 DIAGNOSIS — E1169 Type 2 diabetes mellitus with other specified complication: Secondary | ICD-10-CM | POA: Diagnosis not present

## 2021-01-14 DIAGNOSIS — Z89511 Acquired absence of right leg below knee: Secondary | ICD-10-CM | POA: Diagnosis not present

## 2021-01-14 DIAGNOSIS — M869 Osteomyelitis, unspecified: Secondary | ICD-10-CM | POA: Diagnosis not present

## 2021-01-14 DIAGNOSIS — B9561 Methicillin susceptible Staphylococcus aureus infection as the cause of diseases classified elsewhere: Secondary | ICD-10-CM | POA: Diagnosis not present

## 2021-01-15 DIAGNOSIS — I10 Essential (primary) hypertension: Secondary | ICD-10-CM | POA: Diagnosis not present

## 2021-01-15 DIAGNOSIS — J449 Chronic obstructive pulmonary disease, unspecified: Secondary | ICD-10-CM | POA: Diagnosis not present

## 2021-01-15 DIAGNOSIS — I5032 Chronic diastolic (congestive) heart failure: Secondary | ICD-10-CM | POA: Diagnosis not present

## 2021-01-15 DIAGNOSIS — E782 Mixed hyperlipidemia: Secondary | ICD-10-CM | POA: Diagnosis not present

## 2021-01-16 DIAGNOSIS — M869 Osteomyelitis, unspecified: Secondary | ICD-10-CM | POA: Diagnosis not present

## 2021-01-16 DIAGNOSIS — Z4781 Encounter for orthopedic aftercare following surgical amputation: Secondary | ICD-10-CM | POA: Diagnosis not present

## 2021-01-16 DIAGNOSIS — Z89511 Acquired absence of right leg below knee: Secondary | ICD-10-CM | POA: Diagnosis not present

## 2021-01-16 DIAGNOSIS — E1169 Type 2 diabetes mellitus with other specified complication: Secondary | ICD-10-CM | POA: Diagnosis not present

## 2021-01-16 DIAGNOSIS — B9561 Methicillin susceptible Staphylococcus aureus infection as the cause of diseases classified elsewhere: Secondary | ICD-10-CM | POA: Diagnosis not present

## 2021-01-17 DIAGNOSIS — J9611 Chronic respiratory failure with hypoxia: Secondary | ICD-10-CM | POA: Diagnosis not present

## 2021-01-17 DIAGNOSIS — J449 Chronic obstructive pulmonary disease, unspecified: Secondary | ICD-10-CM | POA: Diagnosis not present

## 2021-01-17 DIAGNOSIS — R0609 Other forms of dyspnea: Secondary | ICD-10-CM | POA: Diagnosis not present

## 2021-01-19 DIAGNOSIS — E1169 Type 2 diabetes mellitus with other specified complication: Secondary | ICD-10-CM | POA: Diagnosis not present

## 2021-01-19 DIAGNOSIS — Z89511 Acquired absence of right leg below knee: Secondary | ICD-10-CM | POA: Diagnosis not present

## 2021-01-19 DIAGNOSIS — B9561 Methicillin susceptible Staphylococcus aureus infection as the cause of diseases classified elsewhere: Secondary | ICD-10-CM | POA: Diagnosis not present

## 2021-01-19 DIAGNOSIS — Z4781 Encounter for orthopedic aftercare following surgical amputation: Secondary | ICD-10-CM | POA: Diagnosis not present

## 2021-01-19 DIAGNOSIS — M869 Osteomyelitis, unspecified: Secondary | ICD-10-CM | POA: Diagnosis not present

## 2021-01-20 ENCOUNTER — Ambulatory Visit: Payer: Medicare Other | Admitting: Medical

## 2021-01-20 ENCOUNTER — Telehealth: Payer: Self-pay | Admitting: Family Medicine

## 2021-01-20 NOTE — Telephone Encounter (Signed)
   Joel Perez DOB: 12-Feb-1946 MRN: [id number]   RIDER WAIVER AND RELEASE OF LIABILITY  For purposes of improving physical access to our facilities, [facility] is pleased to partner with third parties to provide [place] patients or other authorized individuals the option of convenient, on-demand ground transportation services (the [place]") through use of the technology service that enables users to request on-demand ground transportation from independent third-party providers.  By opting to use and accept these [employer], I, the undersigned, hereby agree on behalf of myself, and on behalf of any minor child using the Government social research officer for whom I am the parent or legal guardian, as follows:  Government social research officer provided to me are provided by independent third-party transportation providers who are not [employer] or employees and who are unaffiliated with [employer]. [facility] is neither a transportation carrier nor a common or public carrier. [facility] has no control over the quality or safety of the transportation that occurs as a result of the [employer]. [facility] cannot guarantee that any third-party transportation provider will complete any arranged transportation service. [facility] makes no representation, warranty, or guarantee regarding the reliability, timeliness, quality, safety, suitability, or availability of any of the Transport Services or that they will be error free. I fully understand that traveling by vehicle involves risks and dangers of serious bodily injury, including permanent disability, paralysis, and death. I agree, on behalf of myself and on behalf of any minor child using the Transport Services for whom I am the parent or legal guardian, that the entire risk arising out of my use of the [employer] remains solely with me, to the maximum extent permitted under applicable law. The [employer] are provided "as  is" and "as available." [facility] disclaims all representations and warranties, express, implied or statutory, not expressly set out in these terms, including the implied warranties of merchantability and fitness for a particular purpose. I hereby waive and release [facility], its agents, employees, officers, directors, representatives, insurers, attorneys, assigns, successors, subsidiaries, and affiliates from any and all past, present, or future claims, demands, liabilities, actions, causes of action, or suits of any kind directly or indirectly arising from acceptance and use of the [employer]. I further waive and release [place] and its affiliates from all present and future liability and responsibility for any injury or death to persons or damages to property caused by or related to the use of the [employer]. I have read this Waiver and Release of Liability, and I understand the terms used in it and their legal significance. This Waiver is freely and voluntarily given with the understanding that my right (as well as the right of any minor child for whom I am the parent or legal guardian using the [employer]) to legal recourse against [facility] in connection with the [employer] is knowingly surrendered in return for use of these services.   I attest that I read the consent document to Joel Perez, gave Joel Perez the opportunity to ask questions and answered the questions asked (if any). I affirm that Joel Perez then provided consent for he's participation in this program.     Joel Perez

## 2021-01-21 DIAGNOSIS — J9611 Chronic respiratory failure with hypoxia: Secondary | ICD-10-CM | POA: Diagnosis not present

## 2021-01-21 DIAGNOSIS — M869 Osteomyelitis, unspecified: Secondary | ICD-10-CM | POA: Diagnosis not present

## 2021-01-21 DIAGNOSIS — E1169 Type 2 diabetes mellitus with other specified complication: Secondary | ICD-10-CM | POA: Diagnosis not present

## 2021-01-21 DIAGNOSIS — J449 Chronic obstructive pulmonary disease, unspecified: Secondary | ICD-10-CM | POA: Diagnosis not present

## 2021-01-21 DIAGNOSIS — Z4781 Encounter for orthopedic aftercare following surgical amputation: Secondary | ICD-10-CM | POA: Diagnosis not present

## 2021-01-21 DIAGNOSIS — Z89511 Acquired absence of right leg below knee: Secondary | ICD-10-CM | POA: Diagnosis not present

## 2021-01-21 DIAGNOSIS — B9561 Methicillin susceptible Staphylococcus aureus infection as the cause of diseases classified elsewhere: Secondary | ICD-10-CM | POA: Diagnosis not present

## 2021-01-21 DIAGNOSIS — S88111A Complete traumatic amputation at level between knee and ankle, right lower leg, initial encounter: Secondary | ICD-10-CM | POA: Diagnosis not present

## 2021-01-22 DIAGNOSIS — M869 Osteomyelitis, unspecified: Secondary | ICD-10-CM | POA: Diagnosis not present

## 2021-01-22 DIAGNOSIS — Z89511 Acquired absence of right leg below knee: Secondary | ICD-10-CM | POA: Diagnosis not present

## 2021-01-22 DIAGNOSIS — B9561 Methicillin susceptible Staphylococcus aureus infection as the cause of diseases classified elsewhere: Secondary | ICD-10-CM | POA: Diagnosis not present

## 2021-01-22 DIAGNOSIS — E1169 Type 2 diabetes mellitus with other specified complication: Secondary | ICD-10-CM | POA: Diagnosis not present

## 2021-01-22 DIAGNOSIS — Z4781 Encounter for orthopedic aftercare following surgical amputation: Secondary | ICD-10-CM | POA: Diagnosis not present

## 2021-01-27 DIAGNOSIS — Z89511 Acquired absence of right leg below knee: Secondary | ICD-10-CM | POA: Diagnosis not present

## 2021-01-27 DIAGNOSIS — M869 Osteomyelitis, unspecified: Secondary | ICD-10-CM | POA: Diagnosis not present

## 2021-01-27 DIAGNOSIS — E1169 Type 2 diabetes mellitus with other specified complication: Secondary | ICD-10-CM | POA: Diagnosis not present

## 2021-01-27 DIAGNOSIS — B9561 Methicillin susceptible Staphylococcus aureus infection as the cause of diseases classified elsewhere: Secondary | ICD-10-CM | POA: Diagnosis not present

## 2021-01-27 DIAGNOSIS — Z4781 Encounter for orthopedic aftercare following surgical amputation: Secondary | ICD-10-CM | POA: Diagnosis not present

## 2021-01-28 DIAGNOSIS — T8189XA Other complications of procedures, not elsewhere classified, initial encounter: Secondary | ICD-10-CM | POA: Diagnosis not present

## 2021-01-29 DIAGNOSIS — M869 Osteomyelitis, unspecified: Secondary | ICD-10-CM | POA: Diagnosis not present

## 2021-01-29 DIAGNOSIS — Z89511 Acquired absence of right leg below knee: Secondary | ICD-10-CM | POA: Diagnosis not present

## 2021-01-29 DIAGNOSIS — B9561 Methicillin susceptible Staphylococcus aureus infection as the cause of diseases classified elsewhere: Secondary | ICD-10-CM | POA: Diagnosis not present

## 2021-01-29 DIAGNOSIS — E1169 Type 2 diabetes mellitus with other specified complication: Secondary | ICD-10-CM | POA: Diagnosis not present

## 2021-01-29 DIAGNOSIS — Z4781 Encounter for orthopedic aftercare following surgical amputation: Secondary | ICD-10-CM | POA: Diagnosis not present

## 2021-01-30 DIAGNOSIS — M869 Osteomyelitis, unspecified: Secondary | ICD-10-CM | POA: Diagnosis not present

## 2021-01-30 DIAGNOSIS — B9561 Methicillin susceptible Staphylococcus aureus infection as the cause of diseases classified elsewhere: Secondary | ICD-10-CM | POA: Diagnosis not present

## 2021-01-30 DIAGNOSIS — Z4781 Encounter for orthopedic aftercare following surgical amputation: Secondary | ICD-10-CM | POA: Diagnosis not present

## 2021-01-30 DIAGNOSIS — Z89511 Acquired absence of right leg below knee: Secondary | ICD-10-CM | POA: Diagnosis not present

## 2021-01-30 DIAGNOSIS — E1169 Type 2 diabetes mellitus with other specified complication: Secondary | ICD-10-CM | POA: Diagnosis not present

## 2021-02-02 DIAGNOSIS — Z79899 Other long term (current) drug therapy: Secondary | ICD-10-CM | POA: Diagnosis not present

## 2021-02-02 DIAGNOSIS — Z5181 Encounter for therapeutic drug level monitoring: Secondary | ICD-10-CM | POA: Diagnosis not present

## 2021-02-02 DIAGNOSIS — M5416 Radiculopathy, lumbar region: Secondary | ICD-10-CM | POA: Diagnosis not present

## 2021-02-02 DIAGNOSIS — G894 Chronic pain syndrome: Secondary | ICD-10-CM | POA: Diagnosis not present

## 2021-02-02 DIAGNOSIS — Z89519 Acquired absence of unspecified leg below knee: Secondary | ICD-10-CM | POA: Diagnosis not present

## 2021-02-03 DIAGNOSIS — E1169 Type 2 diabetes mellitus with other specified complication: Secondary | ICD-10-CM | POA: Diagnosis not present

## 2021-02-03 DIAGNOSIS — Z4781 Encounter for orthopedic aftercare following surgical amputation: Secondary | ICD-10-CM | POA: Diagnosis not present

## 2021-02-03 DIAGNOSIS — Z89511 Acquired absence of right leg below knee: Secondary | ICD-10-CM | POA: Diagnosis not present

## 2021-02-03 DIAGNOSIS — B9561 Methicillin susceptible Staphylococcus aureus infection as the cause of diseases classified elsewhere: Secondary | ICD-10-CM | POA: Diagnosis not present

## 2021-02-03 DIAGNOSIS — M869 Osteomyelitis, unspecified: Secondary | ICD-10-CM | POA: Diagnosis not present

## 2021-02-08 DIAGNOSIS — I739 Peripheral vascular disease, unspecified: Secondary | ICD-10-CM | POA: Diagnosis not present

## 2021-02-08 DIAGNOSIS — M86172 Other acute osteomyelitis, left ankle and foot: Secondary | ICD-10-CM | POA: Diagnosis not present

## 2021-02-08 DIAGNOSIS — J9611 Chronic respiratory failure with hypoxia: Secondary | ICD-10-CM | POA: Diagnosis not present

## 2021-02-11 DIAGNOSIS — Z89511 Acquired absence of right leg below knee: Secondary | ICD-10-CM | POA: Diagnosis not present

## 2021-02-11 DIAGNOSIS — M869 Osteomyelitis, unspecified: Secondary | ICD-10-CM | POA: Diagnosis not present

## 2021-02-11 DIAGNOSIS — E1169 Type 2 diabetes mellitus with other specified complication: Secondary | ICD-10-CM | POA: Diagnosis not present

## 2021-02-11 DIAGNOSIS — B9561 Methicillin susceptible Staphylococcus aureus infection as the cause of diseases classified elsewhere: Secondary | ICD-10-CM | POA: Diagnosis not present

## 2021-02-11 DIAGNOSIS — Z4781 Encounter for orthopedic aftercare following surgical amputation: Secondary | ICD-10-CM | POA: Diagnosis not present

## 2021-02-12 DIAGNOSIS — Z89511 Acquired absence of right leg below knee: Secondary | ICD-10-CM | POA: Diagnosis not present

## 2021-02-12 DIAGNOSIS — Z4781 Encounter for orthopedic aftercare following surgical amputation: Secondary | ICD-10-CM | POA: Diagnosis not present

## 2021-02-12 DIAGNOSIS — B9561 Methicillin susceptible Staphylococcus aureus infection as the cause of diseases classified elsewhere: Secondary | ICD-10-CM | POA: Diagnosis not present

## 2021-02-12 DIAGNOSIS — M869 Osteomyelitis, unspecified: Secondary | ICD-10-CM | POA: Diagnosis not present

## 2021-02-12 DIAGNOSIS — E1169 Type 2 diabetes mellitus with other specified complication: Secondary | ICD-10-CM | POA: Diagnosis not present

## 2021-02-17 ENCOUNTER — Other Ambulatory Visit: Payer: Self-pay | Admitting: Cardiovascular Disease

## 2021-02-17 DIAGNOSIS — Z89511 Acquired absence of right leg below knee: Secondary | ICD-10-CM | POA: Diagnosis not present

## 2021-02-17 DIAGNOSIS — B9561 Methicillin susceptible Staphylococcus aureus infection as the cause of diseases classified elsewhere: Secondary | ICD-10-CM | POA: Diagnosis not present

## 2021-02-17 DIAGNOSIS — M869 Osteomyelitis, unspecified: Secondary | ICD-10-CM | POA: Diagnosis not present

## 2021-02-17 DIAGNOSIS — Z4781 Encounter for orthopedic aftercare following surgical amputation: Secondary | ICD-10-CM | POA: Diagnosis not present

## 2021-02-17 DIAGNOSIS — J9611 Chronic respiratory failure with hypoxia: Secondary | ICD-10-CM | POA: Diagnosis not present

## 2021-02-17 DIAGNOSIS — R600 Localized edema: Secondary | ICD-10-CM

## 2021-02-17 DIAGNOSIS — R0609 Other forms of dyspnea: Secondary | ICD-10-CM | POA: Diagnosis not present

## 2021-02-17 DIAGNOSIS — E1169 Type 2 diabetes mellitus with other specified complication: Secondary | ICD-10-CM | POA: Diagnosis not present

## 2021-02-17 DIAGNOSIS — J449 Chronic obstructive pulmonary disease, unspecified: Secondary | ICD-10-CM | POA: Diagnosis not present

## 2021-02-18 ENCOUNTER — Other Ambulatory Visit: Payer: Self-pay | Admitting: Cardiovascular Disease

## 2021-02-18 DIAGNOSIS — E1169 Type 2 diabetes mellitus with other specified complication: Secondary | ICD-10-CM | POA: Diagnosis not present

## 2021-02-18 DIAGNOSIS — Z89511 Acquired absence of right leg below knee: Secondary | ICD-10-CM | POA: Diagnosis not present

## 2021-02-18 DIAGNOSIS — Z4781 Encounter for orthopedic aftercare following surgical amputation: Secondary | ICD-10-CM | POA: Diagnosis not present

## 2021-02-18 DIAGNOSIS — M869 Osteomyelitis, unspecified: Secondary | ICD-10-CM | POA: Diagnosis not present

## 2021-02-18 DIAGNOSIS — B9561 Methicillin susceptible Staphylococcus aureus infection as the cause of diseases classified elsewhere: Secondary | ICD-10-CM | POA: Diagnosis not present

## 2021-02-18 NOTE — Telephone Encounter (Signed)
Refill Request.  

## 2021-02-20 DIAGNOSIS — J9611 Chronic respiratory failure with hypoxia: Secondary | ICD-10-CM | POA: Diagnosis not present

## 2021-02-20 DIAGNOSIS — S88111A Complete traumatic amputation at level between knee and ankle, right lower leg, initial encounter: Secondary | ICD-10-CM | POA: Diagnosis not present

## 2021-02-20 DIAGNOSIS — J449 Chronic obstructive pulmonary disease, unspecified: Secondary | ICD-10-CM | POA: Diagnosis not present

## 2021-02-24 DIAGNOSIS — Z4781 Encounter for orthopedic aftercare following surgical amputation: Secondary | ICD-10-CM | POA: Diagnosis not present

## 2021-02-24 DIAGNOSIS — B9561 Methicillin susceptible Staphylococcus aureus infection as the cause of diseases classified elsewhere: Secondary | ICD-10-CM | POA: Diagnosis not present

## 2021-02-24 DIAGNOSIS — M869 Osteomyelitis, unspecified: Secondary | ICD-10-CM | POA: Diagnosis not present

## 2021-02-24 DIAGNOSIS — Z89511 Acquired absence of right leg below knee: Secondary | ICD-10-CM | POA: Diagnosis not present

## 2021-02-24 DIAGNOSIS — E1169 Type 2 diabetes mellitus with other specified complication: Secondary | ICD-10-CM | POA: Diagnosis not present

## 2021-03-10 DIAGNOSIS — J9611 Chronic respiratory failure with hypoxia: Secondary | ICD-10-CM | POA: Diagnosis not present

## 2021-03-10 DIAGNOSIS — M86172 Other acute osteomyelitis, left ankle and foot: Secondary | ICD-10-CM | POA: Diagnosis not present

## 2021-03-10 DIAGNOSIS — I739 Peripheral vascular disease, unspecified: Secondary | ICD-10-CM | POA: Diagnosis not present

## 2021-03-12 NOTE — Progress Notes (Signed)
Cardiology Office Note   Date:  03/13/2021   ID:  Joel Perez, DOB [birth year], MRN [id number]  PCP:  [name], MD  Cardiologist:  Dr Fletcher Anon  CC: [facility] Follow Up    History of Present Illness: Joel Perez is a 75 y.o. male who presents for ongoing assessment and management of chronic lower extremity edema, dyspnea on exertion, history of COPD on home O2.  Echocardiogram was completed revealing an EF of 60 to 65% with impaired diastolic relaxation mild aortic regurgitation and mild to moderate stenosis of the aortic valve.  Other history includes alcoholic neuropathy and decreased pedal pulses.  He is followed by Dr. Ruta Hinds, vascular surgeon.  The patient underwent an open right fifth metatarsal amputation by Dr. Oran Rein in August 28, 2020 but wound was not improving with increased drainage.  Was seen in the ED with COPD exacerbation and pneumonia.  MRI of the foot was completed to determine extent of osteomyelitis.  This revealed recurrent osteomyelitis of the right fifth metatarsal and abscess in the right foot.  The foot was not found to be salvageable therefore he was scheduled for right BKA on 11/27/2020.    He comes today in a wheelchair, wearing oxygen via nasal cannula.  He is finished inpatient physical rehab at [facility].  He is due to have home physical therapy which has not started yet.  He requests a flu shot.  He denies any cardiac complaints, breathing status remained stable, he denies any chest pain.  He is not that physically active yet.  He is being fitted for a prosthetic leg on the right.  Being followed by surgeon and PCP.    Past Medical History:  Diagnosis Date   Blind right eye 1985   Following a work accident   BPH (benign prostatic hyperplasia)    Capsular cataract of left eye    COPD (chronic obstructive pulmonary disease) ([place])    Dr. [name]   Current smoker    Long-term smoker. Not interested in quitting   Diverticulosis    With  intermittent diverticulitis   Erectile dysfunction    Essential hypertension    Several recorded blood pressures greater than 140/90.   H/O mitral valve prolapse    By report, but not confirmed by echo.   Low back pain    Chronic. Followed by Dr. Suella Broad   Mild aortic regurgitation 12/2013   Mild to moderate aortic regurgitation on echo   Neuropathy     Past Surgical History:  Procedure Laterality Date   ABDOMINAL AORTOGRAM W/LOWER EXTREMITY N/A 08/27/2020   Procedure: ABDOMINAL AORTOGRAM W/LOWER EXTREMITY;  Surgeon: Cherre Robins, MD;  Location: [place] [facility];  Service: Cardiovascular;  Laterality: N/A;   AMPUTATION Right 08/28/2020   Procedure: AMPUTATION RAY FIFTH TOE;  Surgeon: Cherre Robins, MD;  Location: [place];  Service: Vascular;  Laterality: Right;   AMPUTATION Right 11/27/2020   Procedure: RIGHT BELOW KNEE AMPUTATION;  Surgeon: Cherre Robins, MD;  Location: [facility];  Service: Vascular;  Laterality: Right;   APPLICATION OF WOUND VAC Right 08/28/2020   Procedure: APPLICATION OF WOUND VAC, right foot;  Surgeon: Cherre Robins, MD;  Location: [place];  Service: Vascular;  Laterality: Right;   COLON SURGERY  2000   COLONOSCOPY WITH PROPOFOL N/A 03/18/2020   Procedure: COLONOSCOPY WITH PROPOFOL;  Surgeon: Otis Brace, MD;  Location: [facility];  Service: Gastroenterology;  Laterality: N/A;   ELBOW SURGERY  POLYPECTOMY  03/18/2020   Procedure: POLYPECTOMY;  Surgeon: Otis Brace, MD;  Location: [facility];  Service: Gastroenterology;;   TRANSTHORACIC ECHOCARDIOGRAM  12/29/2016   EF 60-65%. Moderate LVH. Mild aortic valve calcification. Mild to moderate regurgitation with no stenosis. Mitral valve annular calcification with no comment of prolapse or regurgitation.     Current Outpatient Medications  Medication Sig Dispense Refill   acetaminophen (TYLENOL) 325 MG tablet Take 2 tablets (650 mg total) by mouth every 6 (six) hours.     albuterol  (PROVENTIL) (2.5 MG/3ML) 0.083% nebulizer solution USE 1 VIAL IN NEBULIZER EVERY 6 HOURS AS NEEDED FOR WHEEZING AND FOR SHORTNESS OF BREATH 150 mL 2   aspirin EC 81 MG tablet Take 81 mg by mouth daily. Swallow whole.     Budeson-Glycopyrrol-Formoterol (BREZTRI AEROSPHERE) 160-9-4.8 MCG/ACT AERO Take 2 puffs first thing in am and then another 2 puffs about 12 hours later. 10.7 g 11   cyclobenzaprine (FLEXERIL) 10 MG tablet Take 10 mg by mouth at bedtime.     gabapentin (NEURONTIN) 600 MG tablet Take 0.5 tablets (300 mg total) by mouth 2 (two) times daily. 6 tablet 0   influenza vac split quadrivalent PF (FLUARIX) 0.5 ML injection Inject 0.5 mLs into the muscle once for 1 dose. 0.5 mL 0   losartan (COZAAR) 25 MG tablet Take 1 tablet (25 mg total) by mouth daily.     Melatonin 10 MG TABS Take 1 tablet by mouth at bedtime.     Multiple Vitamin (MULTIVITAMIN WITH MINERALS) TABS tablet Take 1 tablet by mouth daily.     oxyCODONE (OXY IR/ROXICODONE) 5 MG immediate release tablet Take 1-2 tablets (5-10 mg total) by mouth every 4 (four) hours as needed for severe pain. 30 tablet 0   pantoprazole (PROTONIX) 40 MG tablet Take 1 tablet (40 mg total) by mouth daily.     polyethylene glycol (MIRALAX / GLYCOLAX) 17 g packet Take 17 g by mouth daily as needed for mild constipation. 14 each 0   predniSONE (DELTASONE) 10 MG tablet 2 until better then one daily 100 tablet 2   PROAIR HFA 108 (90 Base) MCG/ACT inhaler INHALE 2 PUFFS BY MOUTH EVERY 6 HOURS AS NEEDED FOR WHEEZING FOR SHORTNESS OF BREATH 18 g 7   sodium chloride (OCEAN) 0.65 % nasal spray Place 1 spray into the nose daily as needed for congestion.     vitamin B-12 (CYANOCOBALAMIN) 1000 MCG tablet Take 1,000 mcg by mouth daily.     vitamin C (ASCORBIC ACID) 500 MG tablet Take 500 mg by mouth daily.     atorvastatin (LIPITOR) 40 MG tablet TAKE 1 TABLET BY MOUTH ONCE DAILY . APPOINTMENT REQUIRED FOR FUTURE REFILLS 90 tablet 3   furosemide (LASIX) 20 MG  tablet Take 1 tablet (20 mg total) by mouth daily. 90 tablet 3   No current facility-administered medications for this visit.    Allergies:   Other, Codeine, and Sulfa antibiotics    Social History:  The patient  reports that he quit smoking about 2 years ago. His smoking use included cigarettes. He has a 100.00 pack-year smoking history. He has never used smokeless tobacco. He reports current alcohol use. He reports that he does not use drugs.   Family History:  The patient's family history includes Brain cancer in his brother; Breast cancer in his mother; Heart disease in his father; Other in his sister.    ROS: All other systems are reviewed and negative. Unless otherwise mentioned in H&P  PHYSICAL EXAM: VS:  BP 110/68   Pulse 96   Ht 5\' 10"  (1.778 m)   Wt 220 lb (99.8 kg)   SpO2 97%   BMI 31.57 kg/m  , BMI Body mass index is 31.57 kg/m. GEN: Well nourished, well developed, in no acute distress, morbidly obese HEENT: normal Neck: no JVD, carotid bruits, or masses Cardiac: RRR 1/6 systolic; no murmurs, rubs, or gallops,no edema  Respiratory:  Clear to auscultation bilaterally, normal work of breathing, wearing O2 via nasal cannula 4 L GI: soft, nontender, nondistended, + BS MS: Right BKA, dependent cyanosis on the left with diminished pulses, venous stasis skin changes and severely dry skin Skin: warm and dry, no rash Neuro:  Strength and sensation are unable to be assessed. Psych: euthymic mood, full affect   EKG:  EKG is not ordered today.    Recent Labs: 07/26/2020: B Natriuretic Peptide 71.0 11/24/2020: Magnesium 1.9 11/29/2020: Hemoglobin 10.0; Platelets 481 11/30/2020: ALT 16; BUN 15; Potassium 3.9; Sodium 138 12/07/2020: Creatinine, Ser 0.76    Lipid Panel    Component Value Date/Time   CHOL 129 08/27/2020 0059   CHOL 154 04/08/2017 1504   TRIG 90 08/27/2020 0059   HDL 37 (L) 08/27/2020 0059   HDL 48 04/08/2017 1504   CHOLHDL 3.5 08/27/2020 0059   VLDL 18  08/27/2020 0059   LDLCALC 74 08/27/2020 0059   LDLCALC 83 04/08/2017 1504      Wt Readings from Last 3 Encounters:  03/13/21 220 lb (99.8 kg)  12/15/20 212 lb (96.2 kg)  12/09/20 223 lb 1.7 oz (101.2 kg)      Other studies Reviewed: Echocardiogram 07-Dec-2020 1. Left ventricular ejection fraction, by estimation, is 60 to 65%. The  left ventricle has normal function. The left ventricle has no regional  wall motion abnormalities. There is mild left ventricular hypertrophy.  Left ventricular diastolic parameters  were normal.   2. Right ventricular systolic function is normal. The right ventricular  size is normal.   3. The mitral valve is normal in structure. Trivial mitral valve  regurgitation. No evidence of mitral stenosis.   4. The aortic valve is calcified. There is moderate calcification of the  aortic valve. There is moderate thickening of the aortic valve. Aortic  valve regurgitation is trivial. Mild aortic valve stenosis.   5. The inferior vena cava is normal in size with greater than 50%  respiratory variability, suggesting right atrial pressure of 3 mmHg.   ASSESSMENT AND PLAN:  1.  PAD: Status post right BKA in the setting of osteomyelitis and peripheral arterial disease.  He wishes to change to Dr. Gwenlyn Found as his vascular physician, as Dr. Gwenlyn Found has appointments in the afternoon which is easier for him to attend.  No changes in his medication regimen at this time.  He will need ongoing evaluation and follow-up for PAD  2.  COPD: Oxygen dependent on 4 L via nasal cannula.  Does not always wear it as his oxygen saturation remains above 97% when he is at rest.  Followed by Dr. [name].  3.  Hypertension: Blood pressure is currently low normal.  Continue losartan 25 mg daily and Lasix 20 mg daily.  Labs recently performed during hospitalization.  4.  Hyperlipidemia: Remains on atorvastatin 40 mg daily.  Refills provided.  He will need follow-up lipids and LFTs every 6 months.   Goal of LDL less than 70  5.  Need for immunization against influenza: Flu shot provided in the office.  Current medicines are reviewed at length with the patient today.  I have spent 25 min's  dedicated to the care of this patient on the date of this encounter to include pre-visit review of records, assessment, management and diagnostic testing,with shared decision making.  Labs/ tests ordered today include: None Phill Myron. West Pugh, ANP, AACC   03/13/2021 5:18 PM    [address] Office [phone] Fax [phone]  Notice: This dictation was prepared with Dragon dictation along with smaller phrase technology. Any transcriptional errors that result from this process are unintentional and may not be corrected upon review.

## 2021-03-13 ENCOUNTER — Other Ambulatory Visit: Payer: Self-pay

## 2021-03-13 ENCOUNTER — Ambulatory Visit (INDEPENDENT_AMBULATORY_CARE_PROVIDER_SITE_OTHER): Payer: Medicare Other | Admitting: Adult Health

## 2021-03-13 ENCOUNTER — Encounter: Payer: Self-pay | Admitting: Adult Health

## 2021-03-13 VITALS — BP 110/68 | HR 96 | Ht 70.0 in | Wt 220.0 lb

## 2021-03-13 DIAGNOSIS — J410 Simple chronic bronchitis: Secondary | ICD-10-CM | POA: Diagnosis not present

## 2021-03-13 DIAGNOSIS — M869 Osteomyelitis, unspecified: Secondary | ICD-10-CM

## 2021-03-13 DIAGNOSIS — E7800 Pure hypercholesterolemia, unspecified: Secondary | ICD-10-CM | POA: Diagnosis not present

## 2021-03-13 DIAGNOSIS — Z89511 Acquired absence of right leg below knee: Secondary | ICD-10-CM | POA: Diagnosis not present

## 2021-03-13 DIAGNOSIS — R600 Localized edema: Secondary | ICD-10-CM | POA: Diagnosis not present

## 2021-03-13 DIAGNOSIS — I739 Peripheral vascular disease, unspecified: Secondary | ICD-10-CM | POA: Diagnosis not present

## 2021-03-13 DIAGNOSIS — Z23 Encounter for immunization: Secondary | ICD-10-CM

## 2021-03-13 MED ORDER — INFLUENZA VAC SPLIT QUAD 0.5 ML IM SUSY: 0.5000 mL | PREFILLED_SYRINGE | Freq: Once | INTRAMUSCULAR | 0 refills | Status: AC

## 2021-03-13 MED ORDER — ATORVASTATIN CALCIUM 40 MG PO TABS: ORAL_TABLET | ORAL | 3 refills | Status: DC

## 2021-03-13 MED ORDER — FUROSEMIDE 20 MG PO TABS: 20.0000 mg | ORAL_TABLET | Freq: Every day | ORAL | 3 refills | Status: DC

## 2021-03-13 NOTE — Patient Instructions (Signed)
Medication Instructions:  No Changes *If you need a refill on your cardiac medications before your next appointment, please call your pharmacy*   Lab Work: No Labs If you have labs (blood work) drawn today and your tests are completely normal, you will receive your results only by: [place] (if you have MyChart) OR A paper copy in the mail If you have any lab test that is abnormal or we need to change your treatment, we will call you to review the results.   Testing/Procedures: No Testing   Follow-Up: At [facility], you and your health needs are our priority.  As part of our continuing mission to provide you with exceptional heart care, we have created designated Provider Care Teams.  These Care Teams include your primary Cardiologist (physician) and Advanced Practice Providers (APPs -  Physician Assistants and Nurse Practitioners) who all work together to provide you with the care you need, when you need it.  We recommend signing up for the patient portal called "MyChart".  Sign up information is provided on this After Visit Summary.  MyChart is used to connect with patients for Virtual Visits (Telemedicine).  Patients are able to view lab/test results, encounter notes, upcoming appointments, etc.  Non-urgent messages can be sent to your provider as well.   To learn more about what you can do with MyChart, go to [email or web address].    Your next appointment:   3 month(s)  The format for your next appointment:   In Person  Provider:   Quay Burow, MD

## 2021-03-16 ENCOUNTER — Ambulatory Visit: Payer: Medicare Other | Admitting: Internal Medicine

## 2021-03-16 NOTE — Progress Notes (Deleted)
Subjective:   Patient ID: Joel Perez, male    DOB: 13-Dec-1945   MRN: [id number]    Brief patient profile:  87   yowm quit smoking 02/2018/MM  referred by Dr Moreen Fowler for eval of copd with GOLD II criteria established 02/09/13    History of Present Illness  12/27/2012 1st pulmonary eval/ Joel Perez cc onset x 2009 am cough cough/ congestion and progresive doe with white mucus x sev tbsp x sev min better p proaire and maintained on spiriva which he feels doesn't work as well as advair. Doe x one flight steps, dragging the garbage to street stops x 2. He denies cough to me but actually has a rattling with cough maneuver but doesn't bring up much mucus, what he does bring up is mostly in ams and mucoid and some better since quit smoking  rec symbicort 160 Take 2 puffs first thing in am and then another 2 puffs about 12 hours later.  Only use your albuterol (proaire) as a rescue medication to be used if you can't catch your breath by resting or doing a relaxed purse lip breathing pattern. The less you use it, the better it will work when you need it.  As you improve ok to stop spiriva  Please schedule a follow up office visit in 6 weeks, call sooner if needed with pft's    09/22/2015  f/u ov/Joel Perez re: GOLD III copd/ maint rx = symbicort/ spiriva resp We will be referring you to rehab at [facility]        10/03/2017  f/u ov/Joel Perez re:  GOLD III still smoking  / having trouble affording meds Chief Complaint  Patient presents with   Follow-up    Increased SOB over the past 2 wks. He is using his proair 4-5 x per day and neb once daily on average.  He states he gets SOB walking just short distances. He did try round of pred and this helped some. He has been coughing up some clear sputum.    Dyspnea:  Still doe = MMRC3 = can't walk 100 yards even at a slow pace at a flat grade s stopping due to sob   Cough: clear / thick/ rattling worst in am  Sleep: R side / 2 pillows /no 02  SABA use:  Overusing unless  on prednisone rec Plan A = Automatic =  BEVESP  Take 2 puffs first thing in am and then another 2 puffs about 12 hours later.  Work on maintaining perfect  inhaler technique:   Plan B = Backup Only use your albuterol as a rescue medication Plan C = Crisis - only use your albuterol nebulizer if you first try Plan B and it fails to help > ok to use the nebulizer up to every 4 hours but if start needing it regularly call for immediate appointment Plan D = Deltasone, take x 2 daily until better then 1 daily x 5 days until better and stop  The key is to stop smoking completely before smoking completely stops you!  Please schedule a follow up visit in 3 months but call sooner if needed  with all medications /inhalers/ solutions in hand so we can verify exactly what you are taking. This includes all medications from all doctors and over the counters     07/05/2018  f/u ov/Joel Perez re:  GOLD III / maint on symb 160 2bid  Chief Complaint  Patient presents with   Follow-up    PFT done today.  Breathing has improved some. He uses his albuterol inhaler about once per day. He has not needed neb.   Dyspnea:  Treadmill at 1.5 mph at zero incline x 10 min stopped by back   / some [employer] ok MMRC3 = can't walk 100 yards even at a slow pace at a flat grade s stopping due to sob   Cough: none Sleeping: bed is flat/ 2 big pillows  SABA use: once a day  If has to go out of house for any reason  02: has but not using  rec Plan A = Automatic = symbicort 160 Take 2 puffs first thing in am and then another 2 puffs about 12 hours later.  And add spiriva 2 pffs each am  Work on inhaler technique:  Plan B = Backup Only use your albuterol inhaler as a rescue medication Plan C = Crisis - only use your albuterol nebulizer if you first try Plan B   Place your 0xygen on  when you walk to maintain the saturation above 90% or walk slower      11/14/2018  f/u ov/Joel Perez re:  Copd III no better on 20 mg prednisone daily  finished it and this time says no worse off and doesn't think spiriva helping either  Chief Complaint  Patient presents with   Follow-up    Breathing is about the same. He is using his proair 2-3 x per day and neb 2 x per wk.  Dyspnea:  MMRC3 = can't walk 100 yards even at a slow pace at a flat grade s stopping due to sob   Cough: minimal  Sleeping: on bed / flat/ 2 pillows  SABA use: as above 02: has poc doesn't think it works  rec Try off the spiriva (think of it like high octane fuel) Goal for walking is to keep your 02 sats above 90% when walking    02/13/2019  f/u ov/Joel Perez re: COPD GOLD III / 02 dep with activity  / doe not worse off spiriva/ has pred for prn use  Chief Complaint  Patient presents with   Follow-up  Dyspnea:  Doing gxt x 10 min 1.0 mph flat stops due to hip not sob/ sats ? In 80s but not using 02 correctly   Cough: better  Sleeping: on R side/ 2 pillows  SABA use:  02: not using at hs / using 3lpm cooking / not consistently using while walking  rec Continue symbicort 160  Take 2 puffs first thing in am and then another 2 puffs about 12 hours later.  Be sure to wear 02 2.5-3lpm when walking for exercise to keep the saturations consistently above 90% - this will help your heart and circulation and help you burn fat Please schedule a follow up visit in 3 months but call sooner if needed  Add ? Add breztri next ov ? If insurance will cover   05/21/2019  f/u ov/Joel Perez re:  Copd  III  No longer on spiriva / pred is plan D Chief Complaint  Patient presents with   Follow-up    SOB on extertion. Patient denies any coughing.  Dyspnea:  Doing gxt x 15 min s stopping @ grade = flat at 1.1 mph on 3lpm not checking sats  Cough: no cough  Sleeping: on side / 2 pillows  SABA use: neb before activity 02: 3lpm with activity not checking levels / no using at hs / not using it at rest rec Plan A = Automatic =  Always=    Breztri  Take 2 puffs first thing in am and then another  2 puffs about 12 hours later.  Plan B = Backup (to supplement plan A, not to replace it) Only use your albuterol inhaler as a rescue medication  Plan C = Crisis (instead of Plan B but only if Plan B stops working) - only use your albuterol nebulizer if you first try Plan B Plan D = Prednisone 10 mg  Take 2 daily until better then 1 daily x 5 days and stop Make sure you check your oxygen saturations at highest level of activity to be sure it stays over 90% and adjust upward to maintain this level if needed but remember to turn it back to previous settings when you stop (to conserve your supply)     12/07/2019  f/u ov/Joel Perez re:  COPD  III/breztri  Plus saba tid never prechallenge/ prednisone 3 daily "on way down" (this was not rec)  Chief Complaint  Patient presents with   Follow-up  Dyspnea: limited by leg from doing treadmill but walking [place] and stops half way x 20 min loop @ 3lpm sat > 90% lift hip also limiting Cough: better  Sleeping: bed flat/ 2 pillows  SABA use: as above   02: 3lpm  with acitivity only  rec No change in medications  Please schedule a follow up visit in 6  months but call sooner if needed  - [place] office     06/18/2020  f/u ov/[place] office/Joel Perez re: GOLD III / 02 24/7  breztri 2bid And pred 10 x 2 daily says neveroff prednisone  X  6 months and gaining wt - still some vaping  Chief Complaint  Patient presents with   Follow-up    Pt states breathing has been going downhill x 3 wks. He is getting winded walking room to room. He also c/o weakness. Weight is up since the last visit and has some swelling in his legs. He is using his albuterol 5 x per day on average and neb a couple times per wk.   Dyspnea: room to room  > sats 96% on 3lpm  Cough: none  Sleeping: sleeping in recliner x 45 degrees  SABA use: hfa helps so does neb but not rechallenging  02: 3 - 4lpm 24/7  More swelling than usual assoc with sob  rec Prednisone 10 mg x  2 daily until  better then one daily  Lasix 20 mg x 2 daily untl swelling better then daily  Please remember to go to the  [facility]ray department at [facility]   > ? Lung nodule When see [facility] you need CM[facility] TSH  BNP and CBC with diff   CT 07/22/20 1. 16 mm mean diameter right upper lobe spiculated nodule, with smaller 7 mm spiculated satellite nodule in the right upper lobe, both concerning for malignancy. PET-CT recommended for further evaluation. 2. Multifocal airspace disease within the right middle and right lower lobes, consistent with pneumonia. 3. Mediastinal and right hilar lymphadenopathy. Metastatic disease cannot be excluded. 4. Nonspecific diffuse esophageal wall thickening, which could reflect esophagitis. 5. Aortic Atherosclerosis (ICD10-I70.0) and Emphysema (ICD10-J43.9).   08/13/2020  f/u ov/[place] office/Joel Perez re:  GOLD II/ 02 dep for ex and hs  Chief Complaint  Patient presents with   Hospital Followup     Breathing improved some but not back to his normal baseline. He has some cough- non prod. He is using his proair 2 x  daily on average and neb with albuterol about 5 x per wk.     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  Plan B = Backup (to supplement plan A, not to replace it) Only use your albuterol inhaler as a rescue medication  Plan C = Crisis (instead of Plan B but only if Plan B stops working) - only use your albuterol nebulizer if you first try Plan B and it fails to help > ok to use the nebulizer up to every 4 hours but if start needing it regularly call for immediate appointment Plan D = Deltasone = prednisone - increase dose if ABC not working well   2 until better then 1 daily  Plan E = ER - go to ER or call 911 if all else fails  Make sure you check your oxygen saturation  at your highest level of activity  to be sure it stays over 90% and adjust  02      12/15/2020  f/u ov/Joel Perez re: copd  GOLD III / 02 hs on breztri and pred 10 mg floor  Chief Complaint  Patient presents with   Follow-up    Patient is here for a follow up on COPD and does not have any concerns.    Dyspnea:  bedridden s/p R BKA  Cough: none  Sleeping: 30 degrees hob SABA use: sev times , not neb 02: 3lpm  Covid status:   3 total vaccines Rec Plan A = Automatic = Always=    continue breztri Take 2 puffs first thing in am and then another 2 puffs about 12 hours later.   Plan B = Backup (to supplement plan A, not to replace it) Only use your albuterol inhaler as a rescue medication Plan C = Crisis (instead of Plan B but only if Plan B stops working) - only use your albuterol nebulizer if you first try Plan B and it fails to help >  0 2  3lpm 24/7 ok to adjust to keep saturations over 90% is goal   03/16/2021  f/u ov/Joel Perez re: GOLD 3 copd/ 02 hs    maint on ***  No chief complaint on file.   Dyspnea:  *** Cough: *** Sleeping: *** SABA use: *** 02: *** Covid status:   ***   No obvious day to day or daytime variability or assoc excess/ purulent sputum or mucus plugs or hemoptysis or cp or chest tightness, subjective wheeze or overt sinus or hb symptoms.   *** without nocturnal  or early am exacerbation  of respiratory  c/o's or need for noct saba. Also denies any obvious fluctuation of symptoms with weather or environmental changes or other aggravating or alleviating factors except as outlined above   No unusual exposure hx or h/o childhood pna/ asthma or knowledge of premature birth.  Current Allergies, Complete Past Medical History, Past Surgical History, Family History, and Social History were reviewed in [employer]  record.  ROS  The following are not active complaints unless bolded Hoarseness, sore throat, dysphagia, dental problems, itching, sneezing,  nasal congestion or discharge of  excess mucus or purulent secretions, ear ache,   fever, chills, sweats, unintended wt loss or wt gain, classically pleuritic or exertional cp,  orthopnea pnd or arm/hand swelling  or leg swelling, presyncope, palpitations, abdominal pain, anorexia, nausea, vomiting, diarrhea  or change in bowel habits or change in bladder habits, change in stools or change in urine, dysuria, hematuria,  rash, arthralgias, visual complaints, headache, numbness, weakness or ataxia or problems with walking or coordination,  change in mood or  memory.        No outpatient medications have been marked as taking for the 03/16/21 encounter (Appointment) with Tanda Rockers, MD.                   Objective:   Physical Exam  03/16/2021    *** 12/15/2020     212  08/13/2020     249   06/18/2020    243 12/07/2019     227  05/21/2019   234  02/13/2019     227  01/18/2014       179 > 02/07/2015 190 > 03/21/2015   192 > 06/23/2015  196 >   12/30/2015 198 > 03/31/2016 198 >  07/02/2016  201 > 12/30/2016 201 >  04/01/2017   195 >  07/04/2017  201 > 10/03/2017  197 >   01/03/2018  206 > 04/05/2018    206> 05/23/2018  211 > 07/05/2018  217 > 11/14/2018  225     02/09/13 177 lb (80.287 kg)  12/27/12 173 lb 12.8 oz (78.835 kg)        Vital signs reviewed  03/16/2021  - Note at rest 02 sats  ***% on ***   General appearance:    ***    Mod bar ***           Assessment & Plan:

## 2021-03-17 ENCOUNTER — Ambulatory Visit: Payer: Medicare Other | Admitting: Internal Medicine

## 2021-03-19 DIAGNOSIS — J9611 Chronic respiratory failure with hypoxia: Secondary | ICD-10-CM | POA: Diagnosis not present

## 2021-03-19 DIAGNOSIS — J449 Chronic obstructive pulmonary disease, unspecified: Secondary | ICD-10-CM | POA: Diagnosis not present

## 2021-03-19 DIAGNOSIS — R0609 Other forms of dyspnea: Secondary | ICD-10-CM | POA: Diagnosis not present

## 2021-03-23 DIAGNOSIS — J9611 Chronic respiratory failure with hypoxia: Secondary | ICD-10-CM | POA: Diagnosis not present

## 2021-03-23 DIAGNOSIS — S88111A Complete traumatic amputation at level between knee and ankle, right lower leg, initial encounter: Secondary | ICD-10-CM | POA: Diagnosis not present

## 2021-03-23 DIAGNOSIS — J449 Chronic obstructive pulmonary disease, unspecified: Secondary | ICD-10-CM | POA: Diagnosis not present

## 2021-03-27 ENCOUNTER — Telehealth: Payer: Self-pay | Admitting: Internal Medicine

## 2021-03-27 NOTE — Telephone Encounter (Signed)
I have called the pharmacy and they stated that the proair is on back order and they wanted to make sure that they could give the pt ventolin HFA.   Order has been changed to this and they will fill this for the pt.

## 2021-03-30 ENCOUNTER — Telehealth: Payer: Self-pay | Admitting: Internal Medicine

## 2021-03-30 MED ORDER — ALBUTEROL SULFATE HFA 108 (90 BASE) MCG/ACT IN AERS: 2.0000 | INHALATION_SPRAY | Freq: Four times a day (QID) | RESPIRATORY_TRACT | 6 refills | Status: DC | PRN

## 2021-03-30 NOTE — Telephone Encounter (Signed)
I have called the pt and he is aware of the rx for the ventolin that has been sent to his pharmacy.  Pt stated that he will call tomorrow to check on this and he will call if there are any issues.  Nothing further is needed.

## 2021-04-01 ENCOUNTER — Telehealth: Payer: Self-pay | Admitting: Cardiovascular Disease

## 2021-04-01 DIAGNOSIS — I10 Essential (primary) hypertension: Secondary | ICD-10-CM | POA: Diagnosis not present

## 2021-04-01 DIAGNOSIS — J449 Chronic obstructive pulmonary disease, unspecified: Secondary | ICD-10-CM | POA: Diagnosis not present

## 2021-04-01 DIAGNOSIS — E782 Mixed hyperlipidemia: Secondary | ICD-10-CM | POA: Diagnosis not present

## 2021-04-01 DIAGNOSIS — I5032 Chronic diastolic (congestive) heart failure: Secondary | ICD-10-CM | POA: Diagnosis not present

## 2021-04-01 MED ORDER — LOSARTAN POTASSIUM 25 MG PO TABS: 25.0000 mg | ORAL_TABLET | Freq: Every day | ORAL | 3 refills | Status: DC

## 2021-04-01 NOTE — Telephone Encounter (Signed)
Refills has been sent to the pharmacy. 

## 2021-04-01 NOTE — Telephone Encounter (Signed)
*  STAT* If patient is at the pharmacy, call can be transferred to refill team.   1. Which medications need to be refilled? (please list name of each medication and dose if known) losartan (COZAAR) 25 MG tablet  2. Which pharmacy/location (including street and city if local pharmacy) is medication to be sent to? [facility] ([place]), [place] - [address]  3. Do they need a 30 day or 90 day supply? 90 day

## 2021-04-10 ENCOUNTER — Other Ambulatory Visit: Payer: Self-pay | Admitting: Cardiovascular Disease

## 2021-04-10 DIAGNOSIS — Z89511 Acquired absence of right leg below knee: Secondary | ICD-10-CM | POA: Diagnosis not present

## 2021-04-10 DIAGNOSIS — I739 Peripheral vascular disease, unspecified: Secondary | ICD-10-CM | POA: Diagnosis not present

## 2021-04-10 DIAGNOSIS — M86172 Other acute osteomyelitis, left ankle and foot: Secondary | ICD-10-CM | POA: Diagnosis not present

## 2021-04-10 DIAGNOSIS — J9611 Chronic respiratory failure with hypoxia: Secondary | ICD-10-CM | POA: Diagnosis not present

## 2021-04-10 NOTE — Telephone Encounter (Signed)
Refill Request.  

## 2021-04-19 DIAGNOSIS — R0609 Other forms of dyspnea: Secondary | ICD-10-CM | POA: Diagnosis not present

## 2021-04-19 DIAGNOSIS — J449 Chronic obstructive pulmonary disease, unspecified: Secondary | ICD-10-CM | POA: Diagnosis not present

## 2021-04-19 DIAGNOSIS — J9611 Chronic respiratory failure with hypoxia: Secondary | ICD-10-CM | POA: Diagnosis not present

## 2021-04-22 DIAGNOSIS — J449 Chronic obstructive pulmonary disease, unspecified: Secondary | ICD-10-CM | POA: Diagnosis not present

## 2021-04-22 DIAGNOSIS — J9611 Chronic respiratory failure with hypoxia: Secondary | ICD-10-CM | POA: Diagnosis not present

## 2021-04-22 DIAGNOSIS — S88111A Complete traumatic amputation at level between knee and ankle, right lower leg, initial encounter: Secondary | ICD-10-CM | POA: Diagnosis not present

## 2021-04-30 DIAGNOSIS — E782 Mixed hyperlipidemia: Secondary | ICD-10-CM | POA: Diagnosis not present

## 2021-04-30 DIAGNOSIS — J449 Chronic obstructive pulmonary disease, unspecified: Secondary | ICD-10-CM | POA: Diagnosis not present

## 2021-04-30 DIAGNOSIS — I10 Essential (primary) hypertension: Secondary | ICD-10-CM | POA: Diagnosis not present

## 2021-04-30 DIAGNOSIS — I5032 Chronic diastolic (congestive) heart failure: Secondary | ICD-10-CM | POA: Diagnosis not present

## 2021-05-10 DIAGNOSIS — I739 Peripheral vascular disease, unspecified: Secondary | ICD-10-CM | POA: Diagnosis not present

## 2021-05-10 DIAGNOSIS — J9611 Chronic respiratory failure with hypoxia: Secondary | ICD-10-CM | POA: Diagnosis not present

## 2021-05-10 DIAGNOSIS — M86172 Other acute osteomyelitis, left ankle and foot: Secondary | ICD-10-CM | POA: Diagnosis not present

## 2021-05-19 ENCOUNTER — Institutional Professional Consult (permissible substitution): Payer: Medicare Other | Admitting: Internal Medicine

## 2021-05-19 DIAGNOSIS — J449 Chronic obstructive pulmonary disease, unspecified: Secondary | ICD-10-CM | POA: Diagnosis not present

## 2021-05-19 DIAGNOSIS — R0609 Other forms of dyspnea: Secondary | ICD-10-CM | POA: Diagnosis not present

## 2021-05-19 DIAGNOSIS — J9611 Chronic respiratory failure with hypoxia: Secondary | ICD-10-CM | POA: Diagnosis not present

## 2021-05-23 DIAGNOSIS — S88111A Complete traumatic amputation at level between knee and ankle, right lower leg, initial encounter: Secondary | ICD-10-CM | POA: Diagnosis not present

## 2021-05-23 DIAGNOSIS — J9611 Chronic respiratory failure with hypoxia: Secondary | ICD-10-CM | POA: Diagnosis not present

## 2021-05-23 DIAGNOSIS — J449 Chronic obstructive pulmonary disease, unspecified: Secondary | ICD-10-CM | POA: Diagnosis not present

## 2021-06-03 ENCOUNTER — Ambulatory Visit: Payer: Medicare Other | Admitting: Cardiovascular Disease

## 2021-06-10 DIAGNOSIS — M86172 Other acute osteomyelitis, left ankle and foot: Secondary | ICD-10-CM | POA: Diagnosis not present

## 2021-06-10 DIAGNOSIS — I739 Peripheral vascular disease, unspecified: Secondary | ICD-10-CM | POA: Diagnosis not present

## 2021-06-10 DIAGNOSIS — J9611 Chronic respiratory failure with hypoxia: Secondary | ICD-10-CM | POA: Diagnosis not present

## 2021-06-19 DIAGNOSIS — J9611 Chronic respiratory failure with hypoxia: Secondary | ICD-10-CM | POA: Diagnosis not present

## 2021-06-19 DIAGNOSIS — J449 Chronic obstructive pulmonary disease, unspecified: Secondary | ICD-10-CM | POA: Diagnosis not present

## 2021-06-19 DIAGNOSIS — R0609 Other forms of dyspnea: Secondary | ICD-10-CM | POA: Diagnosis not present

## 2021-06-22 DIAGNOSIS — J449 Chronic obstructive pulmonary disease, unspecified: Secondary | ICD-10-CM | POA: Diagnosis not present

## 2021-06-23 DIAGNOSIS — I10 Essential (primary) hypertension: Secondary | ICD-10-CM | POA: Diagnosis not present

## 2021-06-23 DIAGNOSIS — E782 Mixed hyperlipidemia: Secondary | ICD-10-CM | POA: Diagnosis not present

## 2021-06-23 DIAGNOSIS — J9611 Chronic respiratory failure with hypoxia: Secondary | ICD-10-CM | POA: Diagnosis not present

## 2021-06-23 DIAGNOSIS — S88111A Complete traumatic amputation at level between knee and ankle, right lower leg, initial encounter: Secondary | ICD-10-CM | POA: Diagnosis not present

## 2021-06-23 DIAGNOSIS — J449 Chronic obstructive pulmonary disease, unspecified: Secondary | ICD-10-CM | POA: Diagnosis not present

## 2021-06-23 DIAGNOSIS — I5032 Chronic diastolic (congestive) heart failure: Secondary | ICD-10-CM | POA: Diagnosis not present

## 2021-07-06 DIAGNOSIS — Z0000 Encounter for general adult medical examination without abnormal findings: Secondary | ICD-10-CM | POA: Diagnosis not present

## 2021-07-06 DIAGNOSIS — R7303 Prediabetes: Secondary | ICD-10-CM | POA: Diagnosis not present

## 2021-07-06 DIAGNOSIS — L853 Xerosis cutis: Secondary | ICD-10-CM | POA: Diagnosis not present

## 2021-07-06 DIAGNOSIS — I739 Peripheral vascular disease, unspecified: Secondary | ICD-10-CM | POA: Diagnosis not present

## 2021-07-06 DIAGNOSIS — E782 Mixed hyperlipidemia: Secondary | ICD-10-CM | POA: Diagnosis not present

## 2021-07-06 DIAGNOSIS — J449 Chronic obstructive pulmonary disease, unspecified: Secondary | ICD-10-CM | POA: Diagnosis not present

## 2021-07-06 DIAGNOSIS — I10 Essential (primary) hypertension: Secondary | ICD-10-CM | POA: Diagnosis not present

## 2021-07-06 DIAGNOSIS — L89319 Pressure ulcer of right buttock, unspecified stage: Secondary | ICD-10-CM | POA: Diagnosis not present

## 2021-07-06 DIAGNOSIS — R609 Edema, unspecified: Secondary | ICD-10-CM | POA: Diagnosis not present

## 2021-07-06 DIAGNOSIS — J9611 Chronic respiratory failure with hypoxia: Secondary | ICD-10-CM | POA: Diagnosis not present

## 2021-07-06 DIAGNOSIS — Z1389 Encounter for screening for other disorder: Secondary | ICD-10-CM | POA: Diagnosis not present

## 2021-07-09 DIAGNOSIS — D692 Other nonthrombocytopenic purpura: Secondary | ICD-10-CM | POA: Diagnosis not present

## 2021-07-09 DIAGNOSIS — J9611 Chronic respiratory failure with hypoxia: Secondary | ICD-10-CM | POA: Diagnosis not present

## 2021-07-09 DIAGNOSIS — L89312 Pressure ulcer of right buttock, stage 2: Secondary | ICD-10-CM | POA: Diagnosis not present

## 2021-07-09 DIAGNOSIS — M545 Low back pain, unspecified: Secondary | ICD-10-CM | POA: Diagnosis not present

## 2021-07-09 DIAGNOSIS — Z9981 Dependence on supplemental oxygen: Secondary | ICD-10-CM | POA: Diagnosis not present

## 2021-07-09 DIAGNOSIS — F17210 Nicotine dependence, cigarettes, uncomplicated: Secondary | ICD-10-CM | POA: Diagnosis not present

## 2021-07-09 DIAGNOSIS — H5461 Unqualified visual loss, right eye, normal vision left eye: Secondary | ICD-10-CM | POA: Diagnosis not present

## 2021-07-09 DIAGNOSIS — G8929 Other chronic pain: Secondary | ICD-10-CM | POA: Diagnosis not present

## 2021-07-09 DIAGNOSIS — J449 Chronic obstructive pulmonary disease, unspecified: Secondary | ICD-10-CM | POA: Diagnosis not present

## 2021-07-09 DIAGNOSIS — Z89511 Acquired absence of right leg below knee: Secondary | ICD-10-CM | POA: Diagnosis not present

## 2021-07-09 DIAGNOSIS — I089 Rheumatic multiple valve disease, unspecified: Secondary | ICD-10-CM | POA: Diagnosis not present

## 2021-07-09 DIAGNOSIS — I110 Hypertensive heart disease with heart failure: Secondary | ICD-10-CM | POA: Diagnosis not present

## 2021-07-09 DIAGNOSIS — Z8616 Personal history of COVID-19: Secondary | ICD-10-CM | POA: Diagnosis not present

## 2021-07-09 DIAGNOSIS — E782 Mixed hyperlipidemia: Secondary | ICD-10-CM | POA: Diagnosis not present

## 2021-07-09 DIAGNOSIS — R918 Other nonspecific abnormal finding of lung field: Secondary | ICD-10-CM | POA: Diagnosis not present

## 2021-07-09 DIAGNOSIS — R7303 Prediabetes: Secondary | ICD-10-CM | POA: Diagnosis not present

## 2021-07-09 DIAGNOSIS — I739 Peripheral vascular disease, unspecified: Secondary | ICD-10-CM | POA: Diagnosis not present

## 2021-07-09 DIAGNOSIS — I5032 Chronic diastolic (congestive) heart failure: Secondary | ICD-10-CM | POA: Diagnosis not present

## 2021-07-11 DIAGNOSIS — I739 Peripheral vascular disease, unspecified: Secondary | ICD-10-CM | POA: Diagnosis not present

## 2021-07-11 DIAGNOSIS — M86172 Other acute osteomyelitis, left ankle and foot: Secondary | ICD-10-CM | POA: Diagnosis not present

## 2021-07-11 DIAGNOSIS — J9611 Chronic respiratory failure with hypoxia: Secondary | ICD-10-CM | POA: Diagnosis not present

## 2021-07-14 ENCOUNTER — Inpatient Hospital Stay (HOSPITAL_COMMUNITY)
Admission: EM | Admit: 2021-07-14 | Discharge: 2021-07-20 | DRG: 871 | Disposition: A | Discharge status: Hospice | Payer: Medicare Other | Attending: Family Medicine | Admitting: Family Medicine

## 2021-07-14 ENCOUNTER — Encounter (HOSPITAL_COMMUNITY): Payer: Self-pay | Admitting: Emergency Medicine

## 2021-07-14 ENCOUNTER — Other Ambulatory Visit: Payer: Self-pay

## 2021-07-14 ENCOUNTER — Emergency Department (HOSPITAL_COMMUNITY): Payer: Medicare Other

## 2021-07-14 DIAGNOSIS — E1165 Type 2 diabetes mellitus with hyperglycemia: Secondary | ICD-10-CM | POA: Diagnosis not present

## 2021-07-14 DIAGNOSIS — E8729 Other acidosis: Secondary | ICD-10-CM | POA: Diagnosis present

## 2021-07-14 DIAGNOSIS — G629 Polyneuropathy, unspecified: Secondary | ICD-10-CM | POA: Diagnosis present

## 2021-07-14 DIAGNOSIS — Z8249 Family history of ischemic heart disease and other diseases of the circulatory system: Secondary | ICD-10-CM

## 2021-07-14 DIAGNOSIS — R0602 Shortness of breath: Secondary | ICD-10-CM | POA: Diagnosis not present

## 2021-07-14 DIAGNOSIS — H5461 Unqualified visual loss, right eye, normal vision left eye: Secondary | ICD-10-CM | POA: Diagnosis present

## 2021-07-14 DIAGNOSIS — I517 Cardiomegaly: Secondary | ICD-10-CM | POA: Diagnosis not present

## 2021-07-14 DIAGNOSIS — J439 Emphysema, unspecified: Secondary | ICD-10-CM | POA: Diagnosis present

## 2021-07-14 DIAGNOSIS — Z7951 Long term (current) use of inhaled steroids: Secondary | ICD-10-CM | POA: Diagnosis not present

## 2021-07-14 DIAGNOSIS — J189 Pneumonia, unspecified organism: Secondary | ICD-10-CM | POA: Diagnosis present

## 2021-07-14 DIAGNOSIS — R0689 Other abnormalities of breathing: Secondary | ICD-10-CM | POA: Diagnosis not present

## 2021-07-14 DIAGNOSIS — Z7952 Long term (current) use of systemic steroids: Secondary | ICD-10-CM

## 2021-07-14 DIAGNOSIS — D75838 Other thrombocytosis: Secondary | ICD-10-CM | POA: Diagnosis present

## 2021-07-14 DIAGNOSIS — I5033 Acute on chronic diastolic (congestive) heart failure: Secondary | ICD-10-CM | POA: Diagnosis not present

## 2021-07-14 DIAGNOSIS — T380X5A Adverse effect of glucocorticoids and synthetic analogues, initial encounter: Secondary | ICD-10-CM | POA: Diagnosis present

## 2021-07-14 DIAGNOSIS — Z7982 Long term (current) use of aspirin: Secondary | ICD-10-CM | POA: Diagnosis not present

## 2021-07-14 DIAGNOSIS — E669 Obesity, unspecified: Secondary | ICD-10-CM | POA: Diagnosis present

## 2021-07-14 DIAGNOSIS — Z20822 Contact with and (suspected) exposure to covid-19: Secondary | ICD-10-CM | POA: Diagnosis not present

## 2021-07-14 DIAGNOSIS — R0600 Dyspnea, unspecified: Secondary | ICD-10-CM | POA: Diagnosis not present

## 2021-07-14 DIAGNOSIS — J9622 Acute and chronic respiratory failure with hypercapnia: Secondary | ICD-10-CM | POA: Diagnosis not present

## 2021-07-14 DIAGNOSIS — N400 Enlarged prostate without lower urinary tract symptoms: Secondary | ICD-10-CM | POA: Diagnosis present

## 2021-07-14 DIAGNOSIS — R600 Localized edema: Secondary | ICD-10-CM

## 2021-07-14 DIAGNOSIS — A4181 Sepsis due to Enterococcus: Secondary | ICD-10-CM | POA: Diagnosis not present

## 2021-07-14 DIAGNOSIS — R091 Pleurisy: Secondary | ICD-10-CM | POA: Diagnosis not present

## 2021-07-14 DIAGNOSIS — Z79899 Other long term (current) drug therapy: Secondary | ICD-10-CM | POA: Diagnosis not present

## 2021-07-14 DIAGNOSIS — E441 Mild protein-calorie malnutrition: Secondary | ICD-10-CM | POA: Diagnosis not present

## 2021-07-14 DIAGNOSIS — J9621 Acute and chronic respiratory failure with hypoxia: Secondary | ICD-10-CM | POA: Diagnosis not present

## 2021-07-14 DIAGNOSIS — K219 Gastro-esophageal reflux disease without esophagitis: Secondary | ICD-10-CM | POA: Diagnosis not present

## 2021-07-14 DIAGNOSIS — R062 Wheezing: Secondary | ICD-10-CM | POA: Diagnosis not present

## 2021-07-14 DIAGNOSIS — Z66 Do not resuscitate: Secondary | ICD-10-CM | POA: Diagnosis not present

## 2021-07-14 DIAGNOSIS — Z803 Family history of malignant neoplasm of breast: Secondary | ICD-10-CM

## 2021-07-14 DIAGNOSIS — J449 Chronic obstructive pulmonary disease, unspecified: Secondary | ICD-10-CM

## 2021-07-14 DIAGNOSIS — R0902 Hypoxemia: Secondary | ICD-10-CM | POA: Diagnosis not present

## 2021-07-14 DIAGNOSIS — J918 Pleural effusion in other conditions classified elsewhere: Secondary | ICD-10-CM | POA: Diagnosis not present

## 2021-07-14 DIAGNOSIS — J441 Chronic obstructive pulmonary disease with (acute) exacerbation: Secondary | ICD-10-CM | POA: Diagnosis present

## 2021-07-14 DIAGNOSIS — J90 Pleural effusion, not elsewhere classified: Secondary | ICD-10-CM

## 2021-07-14 DIAGNOSIS — Z515 Encounter for palliative care: Secondary | ICD-10-CM | POA: Diagnosis not present

## 2021-07-14 DIAGNOSIS — R279 Unspecified lack of coordination: Secondary | ICD-10-CM | POA: Diagnosis not present

## 2021-07-14 DIAGNOSIS — J9811 Atelectasis: Secondary | ICD-10-CM | POA: Diagnosis not present

## 2021-07-14 DIAGNOSIS — R059 Cough, unspecified: Secondary | ICD-10-CM | POA: Diagnosis not present

## 2021-07-14 DIAGNOSIS — I110 Hypertensive heart disease with heart failure: Secondary | ICD-10-CM | POA: Diagnosis present

## 2021-07-14 DIAGNOSIS — R911 Solitary pulmonary nodule: Secondary | ICD-10-CM | POA: Diagnosis not present

## 2021-07-14 DIAGNOSIS — Z743 Need for continuous supervision: Secondary | ICD-10-CM | POA: Diagnosis not present

## 2021-07-14 DIAGNOSIS — D6489 Other specified anemias: Secondary | ICD-10-CM | POA: Diagnosis not present

## 2021-07-14 DIAGNOSIS — Z48813 Encounter for surgical aftercare following surgery on the respiratory system: Secondary | ICD-10-CM | POA: Diagnosis not present

## 2021-07-14 DIAGNOSIS — I499 Cardiac arrhythmia, unspecified: Secondary | ICD-10-CM | POA: Diagnosis not present

## 2021-07-14 DIAGNOSIS — Z89511 Acquired absence of right leg below knee: Secondary | ICD-10-CM

## 2021-07-14 DIAGNOSIS — Z7401 Bed confinement status: Secondary | ICD-10-CM | POA: Diagnosis not present

## 2021-07-14 DIAGNOSIS — A419 Sepsis, unspecified organism: Secondary | ICD-10-CM | POA: Diagnosis not present

## 2021-07-14 DIAGNOSIS — Z9889 Other specified postprocedural states: Secondary | ICD-10-CM

## 2021-07-14 DIAGNOSIS — Z7189 Other specified counseling: Secondary | ICD-10-CM | POA: Diagnosis not present

## 2021-07-14 DIAGNOSIS — F17210 Nicotine dependence, cigarettes, uncomplicated: Secondary | ICD-10-CM | POA: Diagnosis not present

## 2021-07-14 DIAGNOSIS — I10 Essential (primary) hypertension: Secondary | ICD-10-CM | POA: Diagnosis present

## 2021-07-14 DIAGNOSIS — Z808 Family history of malignant neoplasm of other organs or systems: Secondary | ICD-10-CM

## 2021-07-14 LAB — COMPREHENSIVE METABOLIC PANEL
ALT: 24 U/L (ref 0–44)
AST: 36 U/L (ref 15–41)
Albumin: 3.3 g/dL — ABNORMAL LOW (ref 3.5–5.0)
Alkaline Phosphatase: 76 U/L (ref 38–126)
Anion gap: 9 (ref 5–15)
BUN: 19 mg/dL (ref 8–23)
CO2: 28 mmol/L (ref 22–32)
Calcium: 8.8 mg/dL — ABNORMAL LOW (ref 8.9–10.3)
Chloride: 98 mmol/L (ref 98–111)
Creatinine, Ser: 0.87 mg/dL (ref 0.61–1.24)
GFR, Estimated: >60 mL/min (ref >60)
Glucose, Bld: 169 mg/dL — ABNORMAL HIGH (ref 70–99)
Potassium: 3.6 mmol/L (ref 3.5–5.1)
Sodium: 135 mmol/L (ref 135–145)
Total Bilirubin: 0.7 mg/dL (ref 0.3–1.2)
Total Protein: 7.6 g/dL (ref 6.5–8.1)

## 2021-07-14 LAB — CBC WITH DIFFERENTIAL/PLATELET
Abs Immature Granulocytes: 0.15 10*3/uL — ABNORMAL HIGH (ref 0.00–0.07)
Basophils Absolute: 0.1 10*3/uL (ref 0.0–0.1)
Basophils Relative: 0 %
Eosinophils Absolute: 0.3 10*3/uL (ref 0.0–0.5)
Eosinophils Relative: 1 %
HCT: 29.9 % — ABNORMAL LOW (ref 39.0–52.0)
Hemoglobin: 9.2 g/dL — ABNORMAL LOW (ref 13.0–17.0)
Immature Granulocytes: 1 %
Lymphocytes Relative: 7 %
Lymphs Abs: 1.4 10*3/uL (ref 0.7–4.0)
MCH: 25.2 pg — ABNORMAL LOW (ref 26.0–34.0)
MCHC: 30.8 g/dL (ref 30.0–36.0)
MCV: 81.9 fL (ref 80.0–100.0)
Monocytes Absolute: 1 10*3/uL (ref 0.1–1.0)
Monocytes Relative: 5 %
Neutro Abs: 17.3 10*3/uL — ABNORMAL HIGH (ref 1.7–7.7)
Neutrophils Relative %: 86 %
Platelets: 755 10*3/uL — ABNORMAL HIGH (ref 150–400)
RBC: 3.65 MIL/uL — ABNORMAL LOW (ref 4.22–5.81)
RDW: 16.3 % — ABNORMAL HIGH (ref 11.5–15.5)
WBC: 20.2 10*3/uL — ABNORMAL HIGH (ref 4.0–10.5)
nRBC: 0 % (ref 0.0–0.2)

## 2021-07-14 LAB — RESP PANEL BY RT-PCR (FLU A&B, COVID) ARPGX2
Influenza A by PCR: NEGATIVE
Influenza B by PCR: NEGATIVE
SARS Coronavirus 2 by RT PCR: NEGATIVE

## 2021-07-14 LAB — BLOOD GAS, VENOUS
Acid-Base Excess: 4.6 mmol/L — ABNORMAL HIGH (ref 0.0–2.0)
Bicarbonate: 30.6 mmol/L — ABNORMAL HIGH (ref 20.0–28.0)
Drawn by: 6643
FIO2: 40 %
O2 Saturation: 75.9 %
Patient temperature: 36.7
pCO2, Ven: 52 mmHg (ref 44–60)
pH, Ven: 7.37 (ref 7.25–7.43)
pO2, Ven: 46 mmHg — ABNORMAL HIGH (ref 32–45)

## 2021-07-14 LAB — LACTIC ACID, PLASMA: Lactic Acid, Venous: 2.5 mmol/L (ref 0.5–1.9)

## 2021-07-14 MED ORDER — IPRATROPIUM-ALBUTEROL 0.5-2.5 (3) MG/3ML IN SOLN
3.0000 mL | Freq: Once | RESPIRATORY_TRACT | Status: AC
  Administered 2021-07-14: 3 mL via RESPIRATORY_TRACT
  Filled 2021-07-14: qty 3

## 2021-07-14 MED ORDER — ALBUTEROL SULFATE (2.5 MG/3ML) 0.083% IN NEBU
2.5000 mg | INHALATION_SOLUTION | Freq: Once | RESPIRATORY_TRACT | Status: AC
  Administered 2021-07-14: 2.5 mg via RESPIRATORY_TRACT
  Filled 2021-07-14: qty 3

## 2021-07-14 MED ORDER — SODIUM CHLORIDE 0.9 % IV SOLN
1.0000 g | Freq: Once | INTRAVENOUS | Status: AC
  Administered 2021-07-14: 1 g via INTRAVENOUS
  Filled 2021-07-14: qty 10

## 2021-07-14 MED ORDER — OXYCODONE HCL 5 MG PO TABS
5.0000 mg | ORAL_TABLET | Freq: Once | ORAL | Status: AC
  Administered 2021-07-14: 5 mg via ORAL
  Filled 2021-07-14: qty 1

## 2021-07-14 MED ORDER — METHYLPREDNISOLONE SODIUM SUCC 125 MG IJ SOLR
125.0000 mg | Freq: Once | INTRAMUSCULAR | Status: AC
  Administered 2021-07-14: 125 mg via INTRAVENOUS
  Filled 2021-07-14: qty 2

## 2021-07-14 MED ORDER — LACTATED RINGERS IV BOLUS
1000.0000 mL | Freq: Once | INTRAVENOUS | Status: AC
  Administered 2021-07-14: 1000 mL via INTRAVENOUS

## 2021-07-14 MED ORDER — SODIUM CHLORIDE 0.9 % IV SOLN
500.0000 mg | INTRAVENOUS | Status: AC
  Administered 2021-07-14 – 2021-07-17 (×4): 500 mg via INTRAVENOUS
  Filled 2021-07-14 (×4): qty 5

## 2021-07-14 MED ORDER — OXYCODONE-ACETAMINOPHEN 5-325 MG PO TABS
1.0000 | ORAL_TABLET | Freq: Once | ORAL | Status: AC
  Administered 2021-07-14: 1 via ORAL
  Filled 2021-07-14: qty 1

## 2021-07-14 MED ORDER — SODIUM CHLORIDE 0.9 % IV BOLUS
1000.0000 mL | Freq: Once | INTRAVENOUS | Status: AC
  Administered 2021-07-14: 1000 mL via INTRAVENOUS

## 2021-07-14 NOTE — ED Triage Notes (Signed)
Ems called out for sob for a couple of days. Ems gave pt breathing tx enroute. Pt was 80% when ems arrived.

## 2021-07-14 NOTE — ED Provider Notes (Signed)
[facility] EMERGENCY DEPARTMENT Provider Note   CSN: [id number] Arrival date & time: 07/14/21  1929     History  Chief Complaint  Patient presents with   Shortness of Breath    Joel Perez is a 76 y.o. male.  HPI 76 year old male presents with shortness of breath.  Wife is at the bedside and states that after she changed his diaper he was much more short of breath all of a sudden.  He has been having a cough this afternoon.  Had a sore throat/illness few weeks and has not been great since.  No fevers or chest pain today.  No leg swelling.  Feels like prior COPD exacerbations.  He is chronically on 2-4 L of oxygen.  Home Medications Prior to Admission medications   Medication Sig Start Date End Date Taking? Authorizing Provider  albuterol (PROVENTIL) (2.5 MG/3ML) 0.083% nebulizer solution USE 1 VIAL IN NEBULIZER EVERY 6 HOURS AS NEEDED FOR WHEEZING AND FOR SHORTNESS OF BREATH Patient taking differently: Take 2.5 mg by nebulization every 6 (six) hours as needed for shortness of breath or wheezing. 01/14/20  Yes Tanda Rockers, MD  aspirin EC 81 MG tablet Take 81 mg by mouth daily. Swallow whole.   Yes [provider]  atorvastatin (LIPITOR) 40 MG tablet TAKE 1 TABLET BY MOUTH ONCE DAILY . APPOINTMENT REQUIRED FOR FUTURE REFILLS 03/13/21  Yes Lendon Colonel, NP  Budeson-Glycopyrrol-Formoterol (BREZTRI AEROSPHERE) 160-9-4.8 MCG/ACT AERO Take 2 puffs first thing in am and then another 2 puffs about 12 hours later. 08/13/20  Yes Tanda Rockers, MD  furosemide (LASIX) 20 MG tablet Take 1 tablet (20 mg total) by mouth daily. 03/13/21  Yes Lendon Colonel, NP  gabapentin (NEURONTIN) 600 MG tablet Take 0.5 tablets (300 mg total) by mouth 2 (two) times daily. Patient taking differently: Take 600 mg by mouth every 8 (eight) hours. 12/01/20  Yes Nita Sells, MD  losartan (COZAAR) 25 MG tablet Take 1 tablet (25 mg total) by mouth daily. 04/01/21  Yes Lorretta Harp, MD   Melatonin 10 MG TABS Take 1 tablet by mouth at bedtime.   Yes [provider]  Multiple Vitamin (MULTIVITAMIN WITH MINERALS) TABS tablet Take 1 tablet by mouth daily.   Yes [provider]  oxyCODONE-acetaminophen (PERCOCET) 10-325 MG tablet Take 1 tablet by mouth 4 (four) times daily as needed. 06/02/21  Yes [provider]  polyethylene glycol (MIRALAX / GLYCOLAX) 17 g packet Take 17 g by mouth daily as needed for mild constipation. 12/01/20  Yes Nita Sells, MD  predniSONE (DELTASONE) 10 MG tablet 2 until better then one daily 08/13/20  Yes Tanda Rockers, MD  PROAIR HFA 108 (90 Base) MCG/ACT inhaler INHALE 2 PUFFS BY MOUTH EVERY 6 HOURS AS NEEDED FOR WHEEZING FOR SHORTNESS OF BREATH Patient taking differently: Inhale 2 puffs into the lungs every 6 (six) hours as needed for wheezing or shortness of breath. 12/15/20  Yes Tanda Rockers, MD  vitamin B-12 (CYANOCOBALAMIN) 1000 MCG tablet Take 1,000 mcg by mouth daily.   Yes [provider]  vitamin C (ASCORBIC ACID) 500 MG tablet Take 500 mg by mouth daily.   Yes [provider]  acetaminophen (TYLENOL) 325 MG tablet Take 2 tablets (650 mg total) by mouth every 6 (six) hours. 12/01/20   Nita Sells, MD  albuterol (VENTOLIN HFA) 108 (90 Base) MCG/ACT inhaler Inhale 2 puffs into the lungs every 6 (six) hours as needed for wheezing or shortness  of breath. Patient not taking: Reported on 07/14/2021 03/30/21   Tanda Rockers, MD  oxyCODONE (OXY IR/ROXICODONE) 5 MG immediate release tablet Take 1-2 tablets (5-10 mg total) by mouth every 4 (four) hours as needed for severe pain. Patient not taking: Reported on 07/14/2021 12/01/20   Nita Sells, MD  pantoprazole (PROTONIX) 40 MG tablet Take 1 tablet (40 mg total) by mouth daily. Patient not taking: Reported on 07/14/2021 12/02/20   Nita Sells, MD      Allergies    Other, Codeine, and Sulfa antibiotics    Review of Systems    Review of Systems  Constitutional:  Negative for fever.  Respiratory:  Positive for cough and shortness of breath.   Cardiovascular:  Negative for chest pain and leg swelling.   Physical Exam Updated Vital Signs BP 93/61    Pulse (!) 110    Temp 98.1 F (36.7 C) (Oral)    Resp (!) 23    Ht 5\' 10"  (1.778 m)    Wt 99.8 kg    SpO2 100%    BMI 31.57 kg/m  Physical Exam Vitals and nursing note reviewed.  Constitutional:      Appearance: He is well-developed.  HENT:     Head: Normocephalic and atraumatic.  Cardiovascular:     Rate and Rhythm: Regular rhythm. Tachycardia present.     Heart sounds: Normal heart sounds.  Pulmonary:     Effort: Pulmonary effort is normal. Tachypnea present. No respiratory distress.     Breath sounds: Decreased breath sounds and wheezing present.  Abdominal:     Palpations: Abdomen is soft.     Tenderness: There is no abdominal tenderness.  Musculoskeletal:     Right lower leg: No edema.     Left lower leg: No edema.  Skin:    General: Skin is warm and dry.  Neurological:     Mental Status: He is alert.    ED Results / Procedures / Treatments   Labs (all labs ordered are listed, but only abnormal results are displayed) Labs Reviewed  COMPREHENSIVE METABOLIC PANEL - Abnormal; Notable for the following components:      Result Value   Glucose, Bld 169 (*)    Calcium 8.8 (*)    Albumin 3.3 (*)    All other components within normal limits  CBC WITH DIFFERENTIAL/PLATELET - Abnormal; Notable for the following components:   WBC 20.2 (*)    RBC 3.65 (*)    Hemoglobin 9.2 (*)    HCT 29.9 (*)    MCH 25.2 (*)    RDW 16.3 (*)    Platelets 755 (*)    Neutro Abs 17.3 (*)    Abs Immature Granulocytes 0.15 (*)    All other components within normal limits  LACTIC ACID, PLASMA - Abnormal; Notable for the following components:   Lactic Acid, Venous 2.5 (*)    All other components within normal limits  BLOOD GAS, VENOUS - Abnormal; Notable for the  following components:   pO2, Ven 46 (*)    Bicarbonate 30.6 (*)    Acid-Base Excess 4.6 (*)    All other components within normal limits  RESP PANEL BY RT-PCR (FLU A&B, COVID) [id number]  CULTURE, BLOOD (ROUTINE X 2)  CULTURE, BLOOD (ROUTINE X 2)  LACTIC ACID, PLASMA    EKG EKG Interpretation  Date/Time:  Tuesday July 14 2021 19:37:30 EST Ventricular Rate:  133 PR Interval:  118 QRS Duration: 106 QT Interval:  357 QTC Calculation: 532  R Axis:   85 Text Interpretation: Ectopic atrial tachycardia, unifocal Atrial premature complex Borderline right axis deviation Low voltage, precordial leads Probable anteroseptal infarct, old Prolonged QT interval Baseline wander in lead(s) V5 Confirmed by Sherwood Gambler [phone]) on 07/14/2021 11:51:03 PM  Radiology DG Chest Portable 1 View  Result Date: 07/14/2021 CLINICAL DATA:  Dyspnea EXAM: PORTABLE CHEST 1 VIEW COMPARISON:  11/27/2020, CT 07/22/2020 FINDINGS: Emphysematous disease and bronchitic changes. The left lung is grossly clear. Small right-sided pleural effusion with possible loculation. Vague right upper lobe opacity measuring 3.2 cm, probably representing interval growth of previously noted lung nodule. Stable cardiomediastinal silhouette. Aortic atherosclerosis. Airspace disease at the right base IMPRESSION: 1. Emphysema with small probably loculated right pleural effusion and airspace disease at the right base which may be due to atelectasis or pneumonia 2. Ill-defined right upper lobe opacity likely represents enlargement of previously noted pulmonary nodule, recommend follow-up chest CT. Electronically Signed   By: Donavan Foil M.D.   On: 07/14/2021 20:57    Procedures Procedures    Medications Ordered in ED Medications  azithromycin (ZITHROMAX) 500 mg in sodium chloride 0.9 % 250 mL IVPB (500 mg Intravenous New Bag/Given 07/14/21 2222)  ipratropium-albuterol (DUONEB) 0.5-2.5 (3) MG/3ML nebulizer solution 3 mL (3 mLs Nebulization  Given 07/14/21 2044)  albuterol (PROVENTIL) (2.5 MG/3ML) 0.083% nebulizer solution 2.5 mg (2.5 mg Nebulization Given 07/14/21 2044)  methylPREDNISolone sodium succinate (SOLU-MEDROL) 125 mg/2 mL injection 125 mg (125 mg Intravenous Given 07/14/21 2113)  sodium chloride 0.9 % bolus 1,000 mL (0 mLs Intravenous Stopped 07/14/21 2235)  cefTRIAXone (ROCEPHIN) 1 g in sodium chloride 0.9 % 100 mL IVPB (0 g Intravenous Stopped 07/14/21 2235)  oxyCODONE (Oxy IR/ROXICODONE) immediate release tablet 5 mg (5 mg Oral Given 07/14/21 2205)  oxyCODONE-acetaminophen (PERCOCET/ROXICET) 5-325 MG per tablet 1 tablet (1 tablet Oral Given 07/14/21 2205)  albuterol (PROVENTIL) (2.5 MG/3ML) 0.083% nebulizer solution 2.5 mg (2.5 mg Nebulization Given 07/14/21 2156)  ipratropium-albuterol (DUONEB) 0.5-2.5 (3) MG/3ML nebulizer solution 3 mL (3 mLs Nebulization Given 07/14/21 2156)  lactated ringers bolus 1,000 mL (1,000 mLs Intravenous New Bag/Given 07/14/21 2259)    ED Course/ Medical Decision Making/ A&P                           Medical Decision Making Amount and/or Complexity of Data Reviewed Labs: ordered. Radiology: ordered.  Risk Prescription drug management. Decision regarding hospitalization.   Patient was given his home oxycodone dose for his chronic right shoulder pain.  From a respiratory standpoint, he improved with albuterol nebs though is still wheezy.  Chest x-ray was reviewed by myself and shows probable pneumonia on my read.  He does have an elevated white blood cell count which is similar to what it was back in July but at that time he was also dealing with infection, though it was osteomyelitis.  He was given IV Solu-Medrol.  He has had some soft blood pressures but no outright hypotension.  We will send lactate, blood cultures and give IV antibiotics for the potential pneumonia.  Discussed with hospitalist, Dr. Clearence Ped for admission.        Final Clinical Impression(s) / ED Diagnoses Final  diagnoses:  COPD exacerbation ([place])  Acute and chronic respiratory failure with hypoxia [facility])    Rx / DC Orders ED Discharge Orders     None         Sherwood Gambler, MD 07/14/21 2351

## 2021-07-14 NOTE — ED Notes (Signed)
Pt placed on 2 ltrs, oxygen dropped to 92, on 4 ltrs he dropped to 87 and now on 6 he is 50

## 2021-07-15 ENCOUNTER — Inpatient Hospital Stay (HOSPITAL_COMMUNITY): Payer: Medicare Other

## 2021-07-15 ENCOUNTER — Encounter (HOSPITAL_COMMUNITY): Payer: Self-pay | Admitting: Family Medicine

## 2021-07-15 DIAGNOSIS — E441 Mild protein-calorie malnutrition: Secondary | ICD-10-CM

## 2021-07-15 DIAGNOSIS — K219 Gastro-esophageal reflux disease without esophagitis: Secondary | ICD-10-CM

## 2021-07-15 DIAGNOSIS — I10 Essential (primary) hypertension: Secondary | ICD-10-CM

## 2021-07-15 LAB — CBC WITH DIFFERENTIAL/PLATELET
Abs Immature Granulocytes: 0.12 10*3/uL — ABNORMAL HIGH (ref 0.00–0.07)
Basophils Absolute: 0 10*3/uL (ref 0.0–0.1)
Basophils Relative: 0 %
Eosinophils Absolute: 0 10*3/uL (ref 0.0–0.5)
Eosinophils Relative: 0 %
HCT: 26.8 % — ABNORMAL LOW (ref 39.0–52.0)
Hemoglobin: 8.1 g/dL — ABNORMAL LOW (ref 13.0–17.0)
Immature Granulocytes: 1 %
Lymphocytes Relative: 3 %
Lymphs Abs: 0.5 10*3/uL — ABNORMAL LOW (ref 0.7–4.0)
MCH: 24.3 pg — ABNORMAL LOW (ref 26.0–34.0)
MCHC: 30.2 g/dL (ref 30.0–36.0)
MCV: 80.5 fL (ref 80.0–100.0)
Monocytes Absolute: 0.1 10*3/uL (ref 0.1–1.0)
Monocytes Relative: 0 %
Neutro Abs: 17.3 10*3/uL — ABNORMAL HIGH (ref 1.7–7.7)
Neutrophils Relative %: 96 %
Platelets: 637 10*3/uL — ABNORMAL HIGH (ref 150–400)
RBC: 3.33 MIL/uL — ABNORMAL LOW (ref 4.22–5.81)
RDW: 16.3 % — ABNORMAL HIGH (ref 11.5–15.5)
WBC: 18 10*3/uL — ABNORMAL HIGH (ref 4.0–10.5)
nRBC: 0 % (ref 0.0–0.2)

## 2021-07-15 LAB — COMPREHENSIVE METABOLIC PANEL
ALT: 24 U/L (ref 0–44)
AST: 40 U/L (ref 15–41)
Albumin: 3 g/dL — ABNORMAL LOW (ref 3.5–5.0)
Alkaline Phosphatase: 60 U/L (ref 38–126)
Anion gap: 8 (ref 5–15)
BUN: 18 mg/dL (ref 8–23)
CO2: 26 mmol/L (ref 22–32)
Calcium: 8.5 mg/dL — ABNORMAL LOW (ref 8.9–10.3)
Chloride: 101 mmol/L (ref 98–111)
Creatinine, Ser: 0.73 mg/dL (ref 0.61–1.24)
GFR, Estimated: >60 mL/min (ref >60)
Glucose, Bld: 165 mg/dL — ABNORMAL HIGH (ref 70–99)
Potassium: 4.5 mmol/L (ref 3.5–5.1)
Sodium: 135 mmol/L (ref 135–145)
Total Bilirubin: 0.6 mg/dL (ref 0.3–1.2)
Total Protein: 7.1 g/dL (ref 6.5–8.1)

## 2021-07-15 LAB — LACTIC ACID, PLASMA: Lactic Acid, Venous: 1.8 mmol/L (ref 0.5–1.9)

## 2021-07-15 LAB — BODY FLUID CELL COUNT WITH DIFFERENTIAL
Eos, Fluid: 0 %
Lymphs, Fluid: 25 %
Monocyte-Macrophage-Serous Fluid: 45 % — ABNORMAL LOW (ref 50–90)
Neutrophil Count, Fluid: 30 % — ABNORMAL HIGH (ref 0–25)
Total Nucleated Cell Count, Fluid: 370 cu mm (ref 0–1000)

## 2021-07-15 LAB — GLUCOSE, PLEURAL OR PERITONEAL FLUID: Glucose, Fluid: 160 mg/dL

## 2021-07-15 LAB — MRSA NEXT GEN BY PCR, NASAL: MRSA by PCR Next Gen: NOT DETECTED

## 2021-07-15 LAB — GRAM STAIN

## 2021-07-15 LAB — STREP PNEUMONIAE URINARY ANTIGEN: Strep Pneumo Urinary Antigen: NEGATIVE

## 2021-07-15 LAB — MAGNESIUM: Magnesium: 2.1 mg/dL (ref 1.7–2.4)

## 2021-07-15 MED ORDER — IPRATROPIUM-ALBUTEROL 0.5-2.5 (3) MG/3ML IN SOLN
3.0000 mL | Freq: Four times a day (QID) | RESPIRATORY_TRACT | Status: DC
  Administered 2021-07-15 (×4): 3 mL via RESPIRATORY_TRACT
  Filled 2021-07-15 (×4): qty 3

## 2021-07-15 MED ORDER — ACETAMINOPHEN 325 MG PO TABS
650.0000 mg | ORAL_TABLET | Freq: Four times a day (QID) | ORAL | Status: DC | PRN
  Administered 2021-07-18 – 2021-07-20 (×2): 650 mg via ORAL
  Filled 2021-07-15 (×2): qty 2

## 2021-07-15 MED ORDER — MOMETASONE FURO-FORMOTEROL FUM 200-5 MCG/ACT IN AERO
2.0000 | INHALATION_SPRAY | Freq: Two times a day (BID) | RESPIRATORY_TRACT | Status: DC
  Administered 2021-07-15 – 2021-07-20 (×9): 2 via RESPIRATORY_TRACT
  Filled 2021-07-15: qty 8.8

## 2021-07-15 MED ORDER — GABAPENTIN 250 MG/5ML PO SOLN
600.0000 mg | Freq: Three times a day (TID) | ORAL | Status: DC
  Administered 2021-07-15 (×3): 600 mg via ORAL
  Filled 2021-07-15 (×8): qty 12

## 2021-07-15 MED ORDER — ACETAMINOPHEN 650 MG RE SUPP: 650.0000 mg | Freq: Four times a day (QID) | RECTAL | Status: DC | PRN

## 2021-07-15 MED ORDER — MELATONIN 3 MG PO TABS
9.0000 mg | ORAL_TABLET | Freq: Every day | ORAL | Status: DC
  Administered 2021-07-15 – 2021-07-19 (×6): 9 mg via ORAL
  Filled 2021-07-15 (×6): qty 3

## 2021-07-15 MED ORDER — POLYETHYLENE GLYCOL 3350 17 G PO PACK: 17.0000 g | PACK | Freq: Every day | ORAL | Status: DC | PRN

## 2021-07-15 MED ORDER — HEPARIN SODIUM (PORCINE) 5000 UNIT/ML IJ SOLN
5000.0000 [IU] | Freq: Three times a day (TID) | INTRAMUSCULAR | Status: DC
  Administered 2021-07-15 – 2021-07-20 (×16): 5000 [IU] via SUBCUTANEOUS
  Filled 2021-07-15 (×16): qty 1

## 2021-07-15 MED ORDER — PREDNISONE 20 MG PO TABS: 40.0000 mg | ORAL_TABLET | Freq: Every day | ORAL | Status: DC

## 2021-07-15 MED ORDER — ALBUTEROL SULFATE (2.5 MG/3ML) 0.083% IN NEBU
2.5000 mg | INHALATION_SOLUTION | RESPIRATORY_TRACT | Status: DC | PRN
  Filled 2021-07-15: qty 3

## 2021-07-15 MED ORDER — LIDOCAINE 5 % EX PTCH
1.0000 | MEDICATED_PATCH | CUTANEOUS | Status: DC
  Administered 2021-07-15 – 2021-07-20 (×6): 1 via TRANSDERMAL
  Filled 2021-07-15 (×9): qty 1

## 2021-07-15 MED ORDER — CHLORHEXIDINE GLUCONATE 0.12 % MT SOLN
15.0000 mL | Freq: Two times a day (BID) | OROMUCOSAL | Status: DC
  Administered 2021-07-15 – 2021-07-20 (×11): 15 mL via OROMUCOSAL
  Filled 2021-07-15 (×9): qty 15

## 2021-07-15 MED ORDER — ENSURE ENLIVE PO LIQD
237.0000 mL | Freq: Two times a day (BID) | ORAL | Status: DC
  Administered 2021-07-15 – 2021-07-16 (×3): 237 mL via ORAL

## 2021-07-15 MED ORDER — MORPHINE SULFATE (PF) 2 MG/ML IV SOLN
2.0000 mg | Freq: Once | INTRAVENOUS | Status: AC
  Administered 2021-07-15: 2 mg via INTRAVENOUS
  Filled 2021-07-15: qty 1

## 2021-07-15 MED ORDER — ORAL CARE MOUTH RINSE
15.0000 mL | Freq: Two times a day (BID) | OROMUCOSAL | Status: DC
  Administered 2021-07-15 – 2021-07-19 (×7): 15 mL via OROMUCOSAL

## 2021-07-15 MED ORDER — VITAMIN B-12 1000 MCG PO TABS
1000.0000 ug | ORAL_TABLET | Freq: Every day | ORAL | Status: DC
  Administered 2021-07-16 – 2021-07-20 (×5): 1000 ug via ORAL
  Filled 2021-07-15 (×5): qty 1

## 2021-07-15 MED ORDER — CHLORHEXIDINE GLUCONATE CLOTH 2 % EX PADS
6.0000 | MEDICATED_PAD | Freq: Every day | CUTANEOUS | Status: DC
  Administered 2021-07-16 – 2021-07-20 (×6): 6 via TOPICAL

## 2021-07-15 MED ORDER — ASPIRIN EC 81 MG PO TBEC
81.0000 mg | DELAYED_RELEASE_TABLET | Freq: Every day | ORAL | Status: DC
  Administered 2021-07-16 – 2021-07-20 (×5): 81 mg via ORAL
  Filled 2021-07-15 (×5): qty 1

## 2021-07-15 MED ORDER — METOPROLOL TARTRATE 5 MG/5ML IV SOLN
2.5000 mg | Freq: Once | INTRAVENOUS | Status: AC
Start: 2021-07-15 — End: 2021-07-15
  Administered 2021-07-15: 2.5 mg via INTRAVENOUS
  Filled 2021-07-15: qty 5

## 2021-07-15 MED ORDER — ONDANSETRON HCL 4 MG/2ML IJ SOLN: 4.0000 mg | Freq: Four times a day (QID) | INTRAMUSCULAR | Status: DC | PRN

## 2021-07-15 MED ORDER — SODIUM CHLORIDE 0.9 % IV SOLN
1.0000 g | INTRAVENOUS | Status: DC
  Administered 2021-07-15: 1 g via INTRAVENOUS
  Filled 2021-07-15: qty 10

## 2021-07-15 MED ORDER — ALPRAZOLAM 0.5 MG PO TABS
0.5000 mg | ORAL_TABLET | Freq: Two times a day (BID) | ORAL | Status: DC | PRN
  Administered 2021-07-15 – 2021-07-19 (×6): 0.5 mg via ORAL
  Filled 2021-07-15 (×6): qty 1

## 2021-07-15 MED ORDER — ONDANSETRON HCL 4 MG PO TABS: 4.0000 mg | ORAL_TABLET | Freq: Four times a day (QID) | ORAL | Status: DC | PRN

## 2021-07-15 MED ORDER — OXYCODONE HCL 5 MG PO TABS
5.0000 mg | ORAL_TABLET | ORAL | Status: DC | PRN
  Administered 2021-07-15 – 2021-07-20 (×14): 5 mg via ORAL
  Filled 2021-07-15 (×14): qty 1

## 2021-07-15 MED ORDER — METHYLPREDNISOLONE SODIUM SUCC 40 MG IJ SOLR
40.0000 mg | Freq: Two times a day (BID) | INTRAMUSCULAR | Status: AC
  Administered 2021-07-15 (×2): 40 mg via INTRAVENOUS
  Filled 2021-07-15 (×2): qty 1

## 2021-07-15 MED ORDER — ATORVASTATIN CALCIUM 40 MG PO TABS
40.0000 mg | ORAL_TABLET | Freq: Every day | ORAL | Status: DC
Start: 2021-07-15 — End: 2021-07-20
  Administered 2021-07-16 – 2021-07-20 (×5): 40 mg via ORAL
  Filled 2021-07-15 (×5): qty 1

## 2021-07-15 MED ORDER — PANTOPRAZOLE SODIUM 40 MG PO TBEC
40.0000 mg | DELAYED_RELEASE_TABLET | Freq: Every day | ORAL | Status: DC
  Administered 2021-07-16 – 2021-07-20 (×5): 40 mg via ORAL
  Filled 2021-07-15 (×5): qty 1

## 2021-07-15 MED ORDER — CHLORHEXIDINE GLUCONATE CLOTH 2 % EX PADS
6.0000 | MEDICATED_PAD | Freq: Every day | CUTANEOUS | Status: DC
  Administered 2021-07-15: 6 via TOPICAL

## 2021-07-15 NOTE — Progress Notes (Signed)
Critical lab: Anaerobic blood culture bottle positive for gram positive cocci. Dr. Joesph Fillers notified.

## 2021-07-15 NOTE — Progress Notes (Signed)
PT tolerated right sided thoracentesis procedure well today and 1.1 liters of clear yellow fluid removed with labs collected and sent for processing. PT taken to xray post procedure for chest xray and then transported back to inpatient bed assignment with no acute distress noted. Pt verbalized understanding of post procedure instructions.

## 2021-07-15 NOTE — Assessment & Plan Note (Signed)
Patient wears 4 L nasal cannula at home Acute on chronic failure secondary to COPD exacerbation and pneumonia as well as loculated pleural effusion On arrival patient was requiring 6 L to maintain his oxygen sats After total of 3 breathing treatments he is back down to his 4 L nasal cannula, still with increased work of breathing and wheezing Continue to supplement O2 as needed Continue to monitor

## 2021-07-15 NOTE — Assessment & Plan Note (Signed)
Triggered by pneumonia Respiratory and expiratory wheezing on exam Current smoker Continue steroids, scheduled DuoNeb, as needed albuterol, Dulera Expectorated sputum culture VBG pending to assess for CO2 retention Patient's mentation is at baseline Continue to monitor

## 2021-07-15 NOTE — Evaluation (Signed)
Clinical/Bedside Swallow Evaluation Patient Details  Name: Joel Perez MRN: [id number] Date of Birth: 1945/10/19  Today's Date: 07/15/2021 Time: SLP Start Time (ACUTE ONLY): 1255 SLP Stop Time (ACUTE ONLY): 1312 SLP Time Calculation (min) (ACUTE ONLY): 17 min  Past Medical History:  Past Medical History:  Diagnosis Date   Blind right eye 1985   Following a work accident   BPH (benign prostatic hyperplasia)    Capsular cataract of left eye    COPD (chronic obstructive pulmonary disease) ([place])    Dr. [name]   Current smoker    Long-term smoker. Not interested in quitting   Diverticulosis    With intermittent diverticulitis   Erectile dysfunction    Essential hypertension    Several recorded blood pressures greater than 140/90.   H/O mitral valve prolapse    By report, but not confirmed by echo.   Low back pain    Chronic. Followed by Dr. Suella Broad   Mild aortic regurgitation 12/2013   Mild to moderate aortic regurgitation on echo   Neuropathy    Past Surgical History:  Past Surgical History:  Procedure Laterality Date   ABDOMINAL AORTOGRAM W/LOWER EXTREMITY N/A 08/27/2020   Procedure: ABDOMINAL AORTOGRAM W/LOWER EXTREMITY;  Surgeon: Cherre Robins, MD;  Location: [place] [facility];  Service: Cardiovascular;  Laterality: N/A;   AMPUTATION Right 08/28/2020   Procedure: AMPUTATION RAY FIFTH TOE;  Surgeon: Cherre Robins, MD;  Location: [place];  Service: Vascular;  Laterality: Right;   AMPUTATION Right 11/27/2020   Procedure: RIGHT BELOW KNEE AMPUTATION;  Surgeon: Cherre Robins, MD;  Location: [facility] OR;  Service: Vascular;  Laterality: Right;   APPLICATION OF WOUND VAC Right 08/28/2020   Procedure: APPLICATION OF WOUND VAC, right foot;  Surgeon: Cherre Robins, MD;  Location: [place];  Service: Vascular;  Laterality: Right;   COLON SURGERY  2000   COLONOSCOPY WITH PROPOFOL N/A 03/18/2020   Procedure: COLONOSCOPY WITH PROPOFOL;  Surgeon: Otis Brace, MD;  Location: [facility]  ENDOSCOPY;  Service: Gastroenterology;  Laterality: N/A;   ELBOW SURGERY     POLYPECTOMY  03/18/2020   Procedure: POLYPECTOMY;  Surgeon: Otis Brace, MD;  Location: [facility] ENDOSCOPY;  Service: Gastroenterology;;   TRANSTHORACIC ECHOCARDIOGRAM  12/29/2016   EF 60-65%. Moderate LVH. Mild aortic valve calcification. Mild to moderate regurgitation with no stenosis. Mitral valve annular calcification with no comment of prolapse or regurgitation.   HPI:  Joel Perez is a 76 y.o. male with history of blindness in the right eye, BPH, COPD, current smoker, hypertension, neuropathy, and more presents ED with a chief complaint of dyspnea.  Patient reports that he had gradual onset of dyspnea over the last 3 weeks.  He became acutely worse about 3 or 4 hours prior to arrival.  Is associated with exertion.  Even minimal exertion like half rolling over in bed to grab something off of the bedside table causes him to be more short of breath.  He has an associated cough that is productive of light yellow sputum.  He reports that he was temporarily better when he was on prednisone which was about a week ago.  Patient reports that he has been out of his Judithann Sauger for a few days.  He has been using his ProAir inhaler about 3 times a day.  He also uses 4 L nasal cannula at baseline.  Patient admits to palpitations but denies fever and chest pain.  Patient reports a decreased appetite over the last 3 weeks.  He reports he is barely taking any solid p.o. intake, but is drinking protein shakes.  He drinks some water as well per his report.  He denies any nausea, vomiting, diarrhea, abdominal pain, dysuria.  Patient has no other complaints at this time. Chest x-ray showed a loculated right pleural effusion, right base pneumonia. Patient did choke on oxycodone reports he takes all of his pills with bread at home. BSE requested.    Assessment / Plan / Recommendation  Clinical Impression  Clinical swallow evaluation completed at  bedside with spouse present. Pt is on 4L oxygen at home and denies difficulty swallowing at home, but reports recent dysphagia with this admission. Pt had thoracentesis this AM. Oral motor examination reveals red tongue and Pt reports slight burning sensation and poor dentition. No gross facial asymmetry. Pt assessed with ice chips, water via cup/straw, puree and regular textures. Pt with immediate cough with straw sip on 25% of trials and coughing eliminated with cup sips. Pt encouraged to take small sips and swallow 2x for each sip. Pt with impaired mastication with solids resulting in prolonged oral transist (some talking while chewing) and min lingual residue. Pt encouraged to refrain from speaking while eating/drinking and to take small bites/sips with dry swallow prn. Pt is at risk for aspiration given poor respiratory status and likely impaired coordingation of respiration and swallow (COPD and chronic O2). Recommend D3/mech soft and thin liquids with NO straws and ok for po medication whole with water one at a time or with puree with intermittent supervsion with po intake. SLP will follow duirng acute stay. SLP Visit Diagnosis: Dysphagia, unspecified (R13.10)    Aspiration Risk  Mild aspiration risk    Diet Recommendation Dysphagia 3 (Mech soft);Thin liquid   Liquid Administration via: Cup;No straw Medication Administration: Whole meds with liquid Supervision: Patient able to self feed;Intermittent supervision to cue for compensatory strategies Compensations: Slow rate;Small sips/bites;Multiple dry swallows after each bite/sip Postural Changes: Seated upright at 90 degrees;Remain upright for at least 30 minutes after po intake    Other  Recommendations Oral Care Recommendations: Oral care BID;Staff/trained caregiver to provide oral care Other Recommendations: Clarify dietary restrictions    Recommendations for follow up therapy are one component of a multi-disciplinary discharge planning  process, led by the attending physician.  Recommendations may be updated based on patient status, additional functional criteria and insurance authorization.  Follow up Recommendations  (pending)      Assistance Recommended at Discharge    Functional Status Assessment Patient has had a recent decline in their functional status and demonstrates the ability to make significant improvements in function in a reasonable and predictable amount of time.  Frequency and Duration min 2x/week  1 week       Prognosis Prognosis for Safe Diet Advancement: Fair Barriers to Reach Goals:  (COPD)      Swallow Study   General Date of Onset: 07/15/21 HPI: BRENDON CHRISTOFFEL is a 76 y.o. male with history of blindness in the right eye, BPH, COPD, current smoker, hypertension, neuropathy, and more presents ED with a chief complaint of dyspnea.  Patient reports that he had gradual onset of dyspnea over the last 3 weeks.  He became acutely worse about 3 or 4 hours prior to arrival.  Is associated with exertion.  Even minimal exertion like half rolling over in bed to grab something off of the bedside table causes him to be more short of breath.  He has an associated cough that is  productive of light yellow sputum.  He reports that he was temporarily better when he was on prednisone which was about a week ago.  Patient reports that he has been out of his Judithann Sauger for a few days.  He has been using his ProAir inhaler about 3 times a day.  He also uses 4 L nasal cannula at baseline.  Patient admits to palpitations but denies fever and chest pain.  Patient reports a decreased appetite over the last 3 weeks.  He reports he is barely taking any solid p.o. intake, but is drinking protein shakes.  He drinks some water as well per his report.  He denies any nausea, vomiting, diarrhea, abdominal pain, dysuria.  Patient has no other complaints at this time. Chest x-ray showed a loculated right pleural effusion, right base pneumonia.  Patient did choke on oxycodone reports he takes all of his pills with bread at home. BSE requested. Type of Study: Bedside Swallow Evaluation Previous Swallow Assessment: N/A Diet Prior to this Study: Regular;Thin liquids Temperature Spikes Noted: No Respiratory Status: Nasal cannula History of Recent Intubation: No Behavior/Cognition: Alert;Cooperative;Pleasant mood Oral Cavity Assessment: Other (comment) (tongue is red) Oral Care Completed by SLP: Yes Oral Cavity - Dentition: Poor condition Vision: Functional for self-feeding Self-Feeding Abilities: Able to feed self;Needs set up Patient Positioning: Upright in bed Baseline Vocal Quality: Normal Volitional Cough: Strong;Congested Volitional Swallow: Able to elicit    Oral/Motor/Sensory Function Overall Oral Motor/Sensory Function: Within functional limits   Ice Chips Ice chips: Within functional limits Presentation: Spoon   Thin Liquid Thin Liquid: Impaired Presentation: Cup;Self Fed;Straw Pharyngeal  Phase Impairments: Suspected delayed Swallow;Cough - Immediate (after straw sips on 1/4 sips)    Nectar Thick Nectar Thick Liquid: Not tested   Honey Thick Honey Thick Liquid: Not tested   Puree Puree: Within functional limits Presentation: Spoon   Solid     Solid: Impaired Oral Phase Impairments: Impaired mastication Oral Phase Functional Implications: Prolonged oral transit;Oral residue     Thank you,  Genene Churn, [place]  Iwao Shamblin 07/15/2021,1:21 PM

## 2021-07-15 NOTE — Procedures (Signed)
° °  US guided Right Thoracentesis  1.1 L clear yellow fluid Sent for labs per MD  Ebl: none  Tolerated well CXR:  No PTX per Dr Thornton Papas

## 2021-07-15 NOTE — H&P (Signed)
History and Physical    Patient: Joel Perez TDV:[id number] DOB: 11-Oct-1945 DOA: 07/14/2021 DOS: the patient was seen and examined on 07/15/2021 PCP: [name], MD  Patient coming from: Home  Chief Complaint:  Chief Complaint  Patient presents with   Shortness of Breath    HPI: Joel Perez is a 76 y.o. male with history of blindness in the right eye, BPH, COPD, current smoker, hypertension, neuropathy, and more presents ED with a chief complaint of dyspnea.  Patient reports that he had gradual onset of dyspnea over the last 3 weeks.  He became acutely worse about 3 or 4 hours prior to arrival.  Is associated with exertion.  Even minimal exertion like half rolling over in bed to grab something off of the bedside table causes him to be more short of breath.  He has an associated cough that is productive of light yellow sputum.  He reports that he was temporarily better when he was on prednisone which was about a week ago.  Patient reports that he has been out of his Judithann Sauger for a few days.  He has been using his ProAir inhaler about 3 times a day.  He also uses 4 L nasal cannula at baseline.  Patient admits to palpitations but denies fever and chest pain.  Patient reports a decreased appetite over the last 3 weeks.  He reports he is barely taking any solid p.o. intake, but is drinking protein shakes.  He drinks some water as well per his report.  He denies any nausea, vomiting, diarrhea, abdominal pain, dysuria.  Patient has no other complaints at this time.  Currently smoking, just a couple cigarettes a day.  He does not drink alcohol, he does not use illicit drugs, he is vaccinated for COVID, patient is full code.  Review of Systems: As mentioned in the history of present illness. All other systems reviewed and are negative. Past Medical History:  Diagnosis Date   Blind right eye 1985   Following a work accident   BPH (benign prostatic hyperplasia)    Capsular cataract of left eye     COPD (chronic obstructive pulmonary disease) ([place])    Dr. [name]   Current smoker    Long-term smoker. Not interested in quitting   Diverticulosis    With intermittent diverticulitis   Erectile dysfunction    Essential hypertension    Several recorded blood pressures greater than 140/90.   H/O mitral valve prolapse    By report, but not confirmed by echo.   Low back pain    Chronic. Followed by Dr. Suella Broad   Mild aortic regurgitation 12/2013   Mild to moderate aortic regurgitation on echo   Neuropathy    Past Surgical History:  Procedure Laterality Date   ABDOMINAL AORTOGRAM W/LOWER EXTREMITY N/A 08/27/2020   Procedure: ABDOMINAL AORTOGRAM W/LOWER EXTREMITY;  Surgeon: Cherre Robins, MD;  Location: [facility];  Service: Cardiovascular;  Laterality: N/A;   AMPUTATION Right 08/28/2020   Procedure: AMPUTATION RAY FIFTH TOE;  Surgeon: Cherre Robins, MD;  Location: [facility];  Service: Vascular;  Laterality: Right;   AMPUTATION Right 11/27/2020   Procedure: RIGHT BELOW KNEE AMPUTATION;  Surgeon: Cherre Robins, MD;  Location: [facility];  Service: Vascular;  Laterality: Right;   APPLICATION OF WOUND VAC Right 08/28/2020   Procedure: APPLICATION OF WOUND VAC, right foot;  Surgeon: Cherre Robins, MD;  Location: [facility];  Service: Vascular;  Laterality: Right;   COLON  SURGERY  2000   COLONOSCOPY WITH PROPOFOL N/A 03/18/2020   Procedure: COLONOSCOPY WITH PROPOFOL;  Surgeon: Otis Brace, MD;  Location: [facility];  Service: Gastroenterology;  Laterality: N/A;   ELBOW SURGERY     POLYPECTOMY  03/18/2020   Procedure: POLYPECTOMY;  Surgeon: Otis Brace, MD;  Location: [facility];  Service: Gastroenterology;;   TRANSTHORACIC ECHOCARDIOGRAM  12/29/2016   EF 60-65%. Moderate LVH. Mild aortic valve calcification. Mild to moderate regurgitation with no stenosis. Mitral valve annular calcification with no comment of prolapse or regurgitation.   Social History:  reports that he  quit smoking about 3 years ago. His smoking use included cigarettes. He has a 100.00 pack-year smoking history. He has never used smokeless tobacco. He reports current alcohol use. He reports that he does not use drugs.  Allergies  Allergen Reactions   Other Swelling and Other (See Comments)    Farmed Fish (tightness in throat & lip swelling)   Codeine Rash   Sulfa Antibiotics Hives    Family History  Problem Relation Age of Onset   Heart disease Father    Breast cancer Mother    Other Sister    Brain cancer Brother     Prior to Admission medications   Medication Sig Start Date End Date Taking? Authorizing Provider  albuterol (PROVENTIL) (2.5 MG/3ML) 0.083% nebulizer solution USE 1 VIAL IN NEBULIZER EVERY 6 HOURS AS NEEDED FOR WHEEZING AND FOR SHORTNESS OF BREATH Patient taking differently: Take 2.5 mg by nebulization every 6 (six) hours as needed for shortness of breath or wheezing. 01/14/20  Yes Tanda Rockers, MD  aspirin EC 81 MG tablet Take 81 mg by mouth daily. Swallow whole.   Yes [provider]  atorvastatin (LIPITOR) 40 MG tablet TAKE 1 TABLET BY MOUTH ONCE DAILY . APPOINTMENT REQUIRED FOR FUTURE REFILLS 03/13/21  Yes Lendon Colonel, NP  Budeson-Glycopyrrol-Formoterol (BREZTRI AEROSPHERE) 160-9-4.8 MCG/ACT AERO Take 2 puffs first thing in am and then another 2 puffs about 12 hours later. 08/13/20  Yes Tanda Rockers, MD  furosemide (LASIX) 20 MG tablet Take 1 tablet (20 mg total) by mouth daily. 03/13/21  Yes Lendon Colonel, NP  gabapentin (NEURONTIN) 600 MG tablet Take 0.5 tablets (300 mg total) by mouth 2 (two) times daily. Patient taking differently: Take 600 mg by mouth every 8 (eight) hours. 12/01/20  Yes Nita Sells, MD  losartan (COZAAR) 25 MG tablet Take 1 tablet (25 mg total) by mouth daily. 04/01/21  Yes Lorretta Harp, MD  Melatonin 10 MG TABS Take 1 tablet by mouth at bedtime.   Yes [provider]  Multiple Vitamin  (MULTIVITAMIN WITH MINERALS) TABS tablet Take 1 tablet by mouth daily.   Yes [provider]  oxyCODONE-acetaminophen (PERCOCET) 10-325 MG tablet Take 1 tablet by mouth 4 (four) times daily as needed. 06/02/21  Yes [provider]  polyethylene glycol (MIRALAX / GLYCOLAX) 17 g packet Take 17 g by mouth daily as needed for mild constipation. 12/01/20  Yes Nita Sells, MD  predniSONE (DELTASONE) 10 MG tablet 2 until better then one daily 08/13/20  Yes Tanda Rockers, MD  PROAIR HFA 108 (90 Base) MCG/ACT inhaler INHALE 2 PUFFS BY MOUTH EVERY 6 HOURS AS NEEDED FOR WHEEZING FOR SHORTNESS OF BREATH Patient taking differently: Inhale 2 puffs into the lungs every 6 (six) hours as needed for wheezing or shortness of breath. 12/15/20  Yes Tanda Rockers, MD  vitamin B-12 (CYANOCOBALAMIN) 1000 MCG tablet Take 1,000 mcg  by mouth daily.   Yes [provider]  vitamin C (ASCORBIC ACID) 500 MG tablet Take 500 mg by mouth daily.   Yes [provider]  acetaminophen (TYLENOL) 325 MG tablet Take 2 tablets (650 mg total) by mouth every 6 (six) hours. 12/01/20   Nita Sells, MD  albuterol (VENTOLIN HFA) 108 (90 Base) MCG/ACT inhaler Inhale 2 puffs into the lungs every 6 (six) hours as needed for wheezing or shortness of breath. Patient not taking: Reported on 07/14/2021 03/30/21   Tanda Rockers, MD  oxyCODONE (OXY IR/ROXICODONE) 5 MG immediate release tablet Take 1-2 tablets (5-10 mg total) by mouth every 4 (four) hours as needed for severe pain. Patient not taking: Reported on 07/14/2021 12/01/20   Nita Sells, MD  pantoprazole (PROTONIX) 40 MG tablet Take 1 tablet (40 mg total) by mouth daily. Patient not taking: Reported on 07/14/2021 12/02/20   Nita Sells, MD    Physical Exam: Vitals:   07/14/21 2300 07/14/21 2330 07/14/21 2345 07/15/21 0000  BP: 92/71 93/61  100/64  Pulse: (!) 111 (!) 110 (!) 111 (!) 108  Resp: 17 (!) 23 19 (!) 26  Temp:  98.1  F (36.7 C)    TempSrc:  Oral    SpO2: 98% 100% 96% 93%  Weight:      Height:       1.  General: Patient lying supine in bed, visibly short of breath  2. Psychiatric: Alert and oriented x 3, mood and behavior normal for situation, pleasant and cooperative with exam   3. Neurologic: Speech and language are normal, face is symmetric, moves all 4 extremities voluntarily, at baseline without acute deficits on limited exam   4. HEENMT:  Head is atraumatic, normocephalic, pupils reactive to light, neck is supple, trachea is midline, mucous membranes are moist   5. Respiratory : Inspiratory and expiratory wheezing present, speaking in 3-4 word phrases, use of accessory muscles, no cyanosis, 4 L nasal cannula in place   6. Cardiovascular : Heart rate tachycardic, rhythm is regular, systolic murmur, no rubs or gallops, no peripheral edema, peripheral pulses palpated   7. Gastrointestinal:  Abdomen is soft, nondistended, nontender to palpation bowel sounds active, no masses or organomegaly palpated   8. Skin:  Skin is warm, dry and intact without rashes, acute lesions, or ulcers on limited exam   9.Musculoskeletal:  Right BKA, no asymmetry in tone, no peripheral edema, peripheral pulses palpated, no tenderness to palpation in the extremities   Data Reviewed: In the ED  Temp 97.6, heart rate 107-134, respiratory is 20-29, patient was 80% on arrival with EMS, initially requiring 6 L to maintain oxygen sats, blood pressure 98/60 at the lowest Patient did receive breathing treatment in route with EMS Patient has a leukocytosis 20.2, hemoglobin 9.2, thrombocytosis 755 Chemistry panel unremarkable aside from a hyperglycemia at 169 Chest x-ray showed a loculated right pleural effusion, right base pneumonia Patient was started on Zithromax, albuterol, given a DuoNeb, Solu-Medrol, oxycodone Patient did choke on oxycodone reports he takes all of his pills with bread at home. Normal saline 1  L Blood cultures pending Rocephin also started Admission requested for acute on chronic respiratory failure with hypoxia  Assessment and Plan: * Acute on chronic respiratory failure with hypoxia (HCC)- (present on admission) Patient wears 4 L nasal cannula at home Acute on chronic failure secondary to COPD exacerbation and pneumonia as well as loculated pleural effusion On arrival patient was requiring 6 L to maintain his oxygen  sats After total of 3 breathing treatments he is back down to his 4 L nasal cannula, still with increased work of breathing and wheezing Continue to supplement O2 as needed Continue to monitor   HTN (hypertension)- (present on admission) Holding losartan in the setting of soft blood pressures as low as 98/60  GERD (gastroesophageal reflux disease)- (present on admission) Continue PPI  Mild protein-calorie malnutrition ([place])- (present on admission) Albumin 3.3 Patient admits to decreased p.o. intake Encourage nutrient dense p.o. intake Continue protein shakes  RLL pneumonia- (present on admission) See plan for sepsis  Sepsis ([place])- (present on admission) With tachycardia, tachypnea, leukocytosis, thrombocytosis, lactic acidosis, respiratory failure Most likely secondary to pneumonia as seen on chest x-ray 1 L bolus, Rocephin, Zithromax given in the ED Continue Rocephin and Zithromax Expectorated sputum culture pending, blood culture pending Urine antigens pending Continue to monitor  COPD with acute exacerbation ([place])- (present on admission) Triggered by pneumonia Respiratory and expiratory wheezing on exam Current smoker Continue steroids, scheduled DuoNeb, as needed albuterol, Dulera Expectorated sputum culture VBG pending to assess for CO2 retention Patient's mentation is at baseline Continue to monitor        Advance Care Planning:   Code Status: Prior full  Consults: None  Family Communication: No family at bedside  Severity of  Illness: The appropriate patient status for this patient is INPATIENT. Inpatient status is judged to be reasonable and necessary in order to provide the required intensity of service to ensure the patient's safety. The patient's presenting symptoms, physical exam findings, and initial radiographic and laboratory data in the context of their chronic comorbidities is felt to place them at high risk for further clinical deterioration. Furthermore, it is not anticipated that the patient will be medically stable for discharge from the hospital within 2 midnights of admission.   * I certify that at the point of admission it is my clinical judgment that the patient will require inpatient hospital care spanning beyond 2 midnights from the point of admission due to high intensity of service, high risk for further deterioration and high frequency of surveillance required.*  Author: [name], DO 07/15/2021 12:53 AM  For on call review [email or web address].

## 2021-07-15 NOTE — Assessment & Plan Note (Signed)
With tachycardia, tachypnea, leukocytosis, thrombocytosis, lactic acidosis, respiratory failure Most likely secondary to pneumonia as seen on chest x-ray 1 L bolus, Rocephin, Zithromax given in the ED Continue Rocephin and Zithromax Expectorated sputum culture pending, blood culture pending Urine antigens pending Continue to monitor

## 2021-07-15 NOTE — Assessment & Plan Note (Signed)
Holding losartan in the setting of soft blood pressures as low as 98/60

## 2021-07-15 NOTE — Progress Notes (Signed)
Patient seen and evaluated, chart reviewed, please see EMR for updated orders. Please see full H&P dictated by admitting physician Dr Clearence Ped for same date of service.    Brief Summary:- a 76 y.o. male with history of blindness in the right eye, BPH, COPD, current smoker, hypertension, neuropathy admitted on 07/15/2021 with acute on chronic COPD exacerbation secondary to presumed pneumonia with worsening hypoxia and pleural effusion  A/p 1) acute on chronic hypoxic respiratory failure--at baseline patient uses 4 L of oxygen at home recently was increased to 5 L of oxygen -Patient is now requiring 6 to 7 L of oxygen to maintain O2 sats above 90% -Patient and wife admits to ongoing tobacco use at home and poor compliance with oxygen due to the need to smoke -Continue bronchodilators, Rocephin/azithromycin and supplemental oxygen  2) sepsis secondary to right-sided pneumonia--management as above #1 -Primary blood cultures with anaerobic bottle growing GPC- -Strep pneumo antigen negative Legionella pending -COVID and influenza negative -Await final culture data -WBC down to 18.0 from 20.2  3) COPD exacerbation/tobacco abuse----management as above #1 -Patient is not ready to quit smoking  4) right-sided pleural effusion in the setting of right-sided pneumonia and underlying COPD -Status post ultrasound-guided thoracentesis on 07/15/2021 with 1.1 L removed -Fluid studies pending  5) acute on chronic anemia--Hgb is down to 8.1 from a baseline around 10 continue to monitor closely  Patient seen and evaluated, chart reviewed, please see EMR for updated orders. Please see full H&P dictated by admitting physician Dr Clearence Ped for same date of service.   =-Plan of care discussed with patient and patient's wife at bedside, questions answered  -Total care time is over 43 minutes  Roxan Hockey, MD

## 2021-07-15 NOTE — Assessment & Plan Note (Signed)
See plan for sepsis

## 2021-07-15 NOTE — ED Notes (Signed)
Gave report to [facility].

## 2021-07-15 NOTE — Assessment & Plan Note (Signed)
Albumin 3.3 Patient admits to decreased p.o. intake Encourage nutrient dense p.o. intake Continue protein shakes

## 2021-07-15 NOTE — TOC Initial Note (Signed)
Transition of Care [facility]) - Initial/Assessment Note    Patient Details  Name: Joel Perez MRN: [id number] Date of Birth: 10/25/1945  Transition of [facility]) CM/SW Contact:    Salome Arnt, [place] Phone Number: 07/15/2021, 9:38 AM  Clinical Narrative:   Pt admitted with acute on chronic respiratory failure with hypoxia. Assessment completed due to high risk readmission score. Per pt's wife, pt requires assist with all ADLs. He had BKA in July and is able to transfer to and from wheelchair with prosthesis. Pt is on 4-5L home O2 through [employer]. PCP recently set up home health RN and PT with [facility]. LCSW notified Santiago Glad with [facility] of admission. Pt's wife plans to take pt home with home health when medically stable. TOC will continue to follow.                 Expected Discharge Plan: [place] Barriers to Discharge: Continued Medical Work up   Patient Goals and CMS Choice Patient states their goals for this hospitalization and ongoing recovery are:: return home   Choice offered to / list presented to : Spouse  Expected Discharge Plan and Services Expected Discharge Plan: [place] In-house Referral: Clinical Social Work   Post Acute Care Choice: Resumption of Svcs/PTA Provider Living arrangements for the past 2 months: Single Family Home                           HH Arranged: RN, PT [place] Agency: [place] Date [place]: 07/15/21 Time [place]: [phone] Representative spoke with at [place]: Santiago Glad  Prior Living Arrangements/Services Living arrangements for the past 2 months: [place] with:: Spouse Patient language and need for interpreter reviewed:: Yes Do you feel safe going back to the place where you live?: Yes      Need for Family Participation in Patient Care: Yes (Comment) Care giver support system in place?: Yes (comment) Current home services: DME, Home PT, Home RN  (wheelchair, walker, BSC, hospital bed, home O2) Criminal Activity/Legal Involvement Pertinent to Current Situation/Hospitalization: No - Comment as needed  Activities of Daily Living Home Assistive Devices/Equipment: Walker (specify type) ADL Screening (condition at time of admission) Patient's cognitive ability adequate to safely complete daily activities?: Yes Is the patient deaf or have difficulty hearing?: Yes Does the patient have difficulty seeing, even when wearing glasses/contacts?: Yes Does the patient have difficulty concentrating, remembering, or making decisions?: No Patient able to express need for assistance with ADLs?: Yes Does the patient have difficulty dressing or bathing?: No Independently performs ADLs?: Yes (appropriate for developmental age)  Permission Sought/Granted                  Emotional Assessment         Alcohol / Substance Use: Not Applicable Psych Involvement: No (comment)  Admission diagnosis:  COPD exacerbation ([place]) [J44.1] Acute and chronic respiratory failure with hypoxia ([place]) [J96.21] Acute on chronic respiratory failure with hypoxia ([place]) [J96.21] Patient Active Problem List   Diagnosis Date Noted   Acute on chronic respiratory failure with hypoxia ([place]) 07/14/2021   Mild protein-calorie malnutrition ([place]) 07/14/2021   GERD (gastroesophageal reflux disease) 07/14/2021   HTN (hypertension) 07/14/2021   MSSA bacteremia 11/26/2020   Foot osteomyelitis, right ([place]) 11/23/2020   RLL pneumonia 11/23/2020   Antibiotic-associated diarrhea 09/16/2020   PICC (peripherally inserted central catheter) in place 09/16/2020  Osteomyelitis ([place]) 08/25/2020   Multiple pulmonary nodules determined by computed tomography of lung 08/14/2020   Pressure injury of skin 07/23/2020   Sepsis ([place]) 07/22/2020   Edema of both legs 12/19/2018   PVD (peripheral vascular disease) ([place]) 12/19/2018   DOE (dyspnea on exertion) 11/14/2018   Chronic  respiratory failure with hypoxia ([place]) 04/06/2018   Decreased pedal pulses 01/17/2017   Mild aortic valve regurgitation 01/17/2017   Leg swelling 12/30/2016   Neuropathy, alcoholic ([place]) 27/11/8673   COPD with acute exacerbation ([place]) 12/27/2012   PCP:  [name], MD Pharmacy:   [facility] ([place]), [place] - [address] [place] ([place]) [place] [address] Phone: [phone] Fax: [phone]     Social Determinants of Health ([place]) Interventions    Readmission Risk Interventions Readmission Risk Prevention Plan 07/15/2021  Transportation Screening Complete  Home Care Screening Complete  Medication Review (RN CM) Complete  Some recent data might be hidden

## 2021-07-15 NOTE — Assessment & Plan Note (Signed)
Continue PPI ?

## 2021-07-16 ENCOUNTER — Inpatient Hospital Stay (HOSPITAL_COMMUNITY): Payer: Medicare Other

## 2021-07-16 DIAGNOSIS — J189 Pneumonia, unspecified organism: Secondary | ICD-10-CM

## 2021-07-16 DIAGNOSIS — J441 Chronic obstructive pulmonary disease with (acute) exacerbation: Secondary | ICD-10-CM

## 2021-07-16 DIAGNOSIS — J9621 Acute and chronic respiratory failure with hypoxia: Secondary | ICD-10-CM

## 2021-07-16 DIAGNOSIS — J810 Acute pulmonary edema: Secondary | ICD-10-CM

## 2021-07-16 DIAGNOSIS — J9622 Acute and chronic respiratory failure with hypercapnia: Secondary | ICD-10-CM

## 2021-07-16 LAB — BLOOD CULTURE ID PANEL (REFLEXED) - BCID2

## 2021-07-16 LAB — AMYLASE, BODY FLUID (OTHER): Amylase, Body Fluid: 26 U/L

## 2021-07-16 LAB — BLOOD GAS, ARTERIAL
Acid-Base Excess: 0 mmol/L (ref 0.0–2.0)
Acid-Base Excess: 1.5 mmol/L (ref 0.0–2.0)
Acid-Base Excess: 6.1 mmol/L — ABNORMAL HIGH (ref 0.0–2.0)
Bicarbonate: 27.9 mmol/L (ref 20.0–28.0)
Bicarbonate: 29.1 mmol/L — ABNORMAL HIGH (ref 20.0–28.0)
Bicarbonate: 31.8 mmol/L — ABNORMAL HIGH (ref 20.0–28.0)
Drawn by: 27733
Drawn by: 38235
Drawn by: 38235
FIO2: 15 %
FIO2: 35 %
FIO2: 45 %
O2 Saturation: 100 %
O2 Saturation: 93.5 %
O2 Saturation: 95.5 %
Patient temperature: 36.8
Patient temperature: 37
Patient temperature: 37
pCO2 arterial: 49 mmHg — ABNORMAL HIGH (ref 32–48)
pCO2 arterial: 53 mmHg — ABNORMAL HIGH (ref 32–48)
pCO2 arterial: 68 mmHg (ref 32–48)
pH, Arterial: 7.24 — ABNORMAL LOW (ref 7.35–7.45)
pH, Arterial: 7.33 — ABNORMAL LOW (ref 7.35–7.45)
pH, Arterial: 7.42 (ref 7.35–7.45)
pO2, Arterial: 129 mmHg — ABNORMAL HIGH (ref 83–108)
pO2, Arterial: 62 mmHg — ABNORMAL LOW (ref 83–108)
pO2, Arterial: 72 mmHg — ABNORMAL LOW (ref 83–108)

## 2021-07-16 LAB — ACID FAST SMEAR (AFB, MYCOBACTERIA): Acid Fast Smear: NEGATIVE

## 2021-07-16 LAB — CBC
HCT: 30.3 % — ABNORMAL LOW (ref 39.0–52.0)
Hemoglobin: 9.1 g/dL — ABNORMAL LOW (ref 13.0–17.0)
MCH: 24.9 pg — ABNORMAL LOW (ref 26.0–34.0)
MCHC: 30 g/dL (ref 30.0–36.0)
MCV: 83 fL (ref 80.0–100.0)
Platelets: 745 10*3/uL — ABNORMAL HIGH (ref 150–400)
RBC: 3.65 MIL/uL — ABNORMAL LOW (ref 4.22–5.81)
RDW: 16.3 % — ABNORMAL HIGH (ref 11.5–15.5)
WBC: 22.8 10*3/uL — ABNORMAL HIGH (ref 4.0–10.5)
nRBC: 0 % (ref 0.0–0.2)

## 2021-07-16 LAB — CYTOLOGY - NON PAP

## 2021-07-16 LAB — BASIC METABOLIC PANEL
Anion gap: 11 (ref 5–15)
BUN: 27 mg/dL — ABNORMAL HIGH (ref 8–23)
CO2: 26 mmol/L (ref 22–32)
Calcium: 9 mg/dL (ref 8.9–10.3)
Chloride: 101 mmol/L (ref 98–111)
Creatinine, Ser: 0.95 mg/dL (ref 0.61–1.24)
GFR, Estimated: >60 mL/min (ref >60)
Glucose, Bld: 131 mg/dL — ABNORMAL HIGH (ref 70–99)
Potassium: 4.2 mmol/L (ref 3.5–5.1)
Sodium: 138 mmol/L (ref 135–145)

## 2021-07-16 LAB — BRAIN NATRIURETIC PEPTIDE: B Natriuretic Peptide: 1599 pg/mL — ABNORMAL HIGH (ref 0.0–100.0)

## 2021-07-16 LAB — PROTEIN, BODY FLUID (OTHER): Total Protein, Body Fluid Other: 2.7 g/dL

## 2021-07-16 LAB — LEGIONELLA PNEUMOPHILA SEROGP 1 UR AG: L. pneumophila Serogp 1 Ur Ag: NEGATIVE

## 2021-07-16 MED ORDER — IPRATROPIUM BROMIDE 0.02 % IN SOLN
0.5000 mg | Freq: Four times a day (QID) | RESPIRATORY_TRACT | Status: DC
  Administered 2021-07-16 – 2021-07-20 (×18): 0.5 mg via RESPIRATORY_TRACT
  Filled 2021-07-16 (×18): qty 2.5

## 2021-07-16 MED ORDER — ADULT MULTIVITAMIN W/MINERALS CH
1.0000 | ORAL_TABLET | Freq: Every day | ORAL | Status: DC
  Administered 2021-07-17 – 2021-07-20 (×4): 1 via ORAL
  Filled 2021-07-16 (×4): qty 1

## 2021-07-16 MED ORDER — FUROSEMIDE 10 MG/ML IJ SOLN
20.0000 mg | Freq: Once | INTRAMUSCULAR | Status: AC
  Administered 2021-07-16: 20 mg via INTRAVENOUS
  Filled 2021-07-16: qty 2

## 2021-07-16 MED ORDER — ENSURE ENLIVE PO LIQD
237.0000 mL | Freq: Three times a day (TID) | ORAL | Status: DC
  Administered 2021-07-17 – 2021-07-20 (×8): 237 mL via ORAL

## 2021-07-16 MED ORDER — SODIUM CHLORIDE 0.9 % IV SOLN
3.0000 g | Freq: Four times a day (QID) | INTRAVENOUS | Status: DC
  Administered 2021-07-16 – 2021-07-20 (×16): 3 g via INTRAVENOUS
  Filled 2021-07-16 (×21): qty 8

## 2021-07-16 MED ORDER — LEVALBUTEROL HCL 1.25 MG/0.5ML IN NEBU
INHALATION_SOLUTION | RESPIRATORY_TRACT | Status: AC
  Administered 2021-07-16: 1.25 mg
  Filled 2021-07-16: qty 0.5

## 2021-07-16 MED ORDER — ALBUTEROL SULFATE (2.5 MG/3ML) 0.083% IN NEBU: 2.5000 mg | INHALATION_SOLUTION | RESPIRATORY_TRACT | Status: DC | PRN

## 2021-07-16 MED ORDER — LEVALBUTEROL HCL 0.63 MG/3ML IN NEBU
0.6300 mg | INHALATION_SOLUTION | Freq: Four times a day (QID) | RESPIRATORY_TRACT | Status: DC
  Administered 2021-07-16 – 2021-07-20 (×18): 0.63 mg via RESPIRATORY_TRACT
  Filled 2021-07-16 (×18): qty 3

## 2021-07-16 MED ORDER — GABAPENTIN 300 MG PO CAPS
600.0000 mg | ORAL_CAPSULE | Freq: Three times a day (TID) | ORAL | Status: DC
Start: 2021-07-16 — End: 2021-07-20
  Administered 2021-07-16 – 2021-07-20 (×11): 600 mg via ORAL
  Filled 2021-07-16 (×11): qty 2

## 2021-07-16 MED ORDER — FUROSEMIDE 10 MG/ML IJ SOLN
20.0000 mg | Freq: Two times a day (BID) | INTRAMUSCULAR | Status: DC
  Administered 2021-07-16 – 2021-07-20 (×8): 20 mg via INTRAVENOUS
  Filled 2021-07-16 (×8): qty 2

## 2021-07-16 MED ORDER — SODIUM CHLORIDE 0.9 % IV SOLN
3.0000 g | Freq: Three times a day (TID) | INTRAVENOUS | Status: DC
  Filled 2021-07-16 (×4): qty 8

## 2021-07-16 MED ORDER — METHYLPREDNISOLONE SODIUM SUCC 40 MG IJ SOLR
40.0000 mg | Freq: Two times a day (BID) | INTRAMUSCULAR | Status: DC
  Administered 2021-07-16 – 2021-07-20 (×9): 40 mg via INTRAVENOUS
  Filled 2021-07-16 (×9): qty 1

## 2021-07-16 NOTE — Consult Note (Addendum)
NAME:  Joel Perez, MRN:  [id number], DOB:  1945/10/26, LOS: 2 ADMISSION DATE:  07/14/2021, CONSULTATION DATE:  07/16/21 REFERRING MD:  triad, CHIEF COMPLAINT:  resp distress    History of Present Illness:  50   yowm  MM /stillactive smoker   GOLD 3 COPD  criteria established 06/2018 maint on breztri and pred 10 mg floor and 02 2.5 lpm admitted 2/22 with progressive doe x 3 weeks assoc cough > yellow mucus and ran out of breztri then much worse > admit with dx of CAP and bacteremia assoc with R Pl effusion > tap x 1.1 liters 2/22 and PCCM consulted am 2/23 for high wob/02 needs     Significant Hospital Events: Including procedures, antibiotic start and stop dates in addition to other pertinent events   BC 2/21 1/2 POS  Enderoccus R Tcentesis x 1.2 liters  WBC  370 with Polys = Lymphs BC x1 2/22  R pleural 2/22 neg gm stain >>> Strep pneumo 2/22>>> Legionella 2/22 >>>  Scheduled Meds:  aspirin EC  81 mg Oral Daily   atorvastatin  40 mg Oral Daily   chlorhexidine  15 mL Mouth Rinse BID   Chlorhexidine Gluconate Cloth  6 each Topical Daily   feeding supplement  237 mL Oral BID BM   gabapentin  600 mg Oral TID   heparin  5,000 Units Subcutaneous Q8H   ipratropium  0.5 mg Nebulization Q6H   levalbuterol  0.63 mg Nebulization Q6H   lidocaine  1 patch Transdermal Q24H   mouth rinse  15 mL Mouth Rinse q12n4p   melatonin  9 mg Oral QHS   methylPREDNISolone (SOLU-MEDROL) injection  40 mg Intravenous Q12H   mometasone-formoterol  2 puff Inhalation BID   pantoprazole  40 mg Oral Daily   vitamin B-12  1,000 mcg Oral Daily   Continuous Infusions:  ampicillin-sulbactam (UNASYN) IV 3 g (07/16/21 1248)   azithromycin 500 mg (07/15/21 2301)   PRN Meds:.acetaminophen **OR** acetaminophen, albuterol, ALPRAZolam, ondansetron **OR** ondansetron (ZOFRAN) IV, oxyCODONE, polyethylene glycol    Interim History / Subjective:  Looks miserable, says getting more sob  despite max resp rx and R tap  2/22  Objective   Blood pressure (!) 142/84, pulse (!) 121, temperature (!) 97.5 F (36.4 C), temperature source Oral, resp. rate (!) 23, height 5\' 10"  (1.778 m), weight 94.3 kg, SpO2 98 %.    FiO2 (%):  [35 %-60 %] 35 %   Intake/Output Summary (Last 24 hours) at 07/16/2021 1332 Last data filed at 07/16/2021 1000 Gross per 24 hour  Intake 150 ml  Output 850 ml  Net -700 ml   Filed Weights   07/14/21 1931 07/15/21 0030 07/16/21 0438  Weight: 99.8 kg 97.8 kg 94.3 kg    Examination: Tmax 98.7  General appearance:  terminally ill appearing elderly wm at 45 degrees hob with air hunger at rest   At Rest 02 sats  98% on 2lpm HFNC  No jvd Oropharynx clear,  mucosa nl Neck supple Lungs with very distant bs bilaterally RRR no s3 or or sign murmur Abd soft, mod obese/ limited excursion  Extr warm with no edema or clubbing noted  - R bka Neuro  Sensorium variably confused re details of care  no apparent motor deficits    I personally reviewed images and agree with radiology impression as follows:  CXR:   2/23 portable 1. Stable mild to moderate severity left basilar and mid to upper right lung airspace disease.  Correlation with chest CT is recommended to exclude an underlying neoplastic process. 2. Mild to moderate severity interstitial edema  Assessment & Plan:  1) acute on chronic hypoxemic/ hypercarbic resp failure / multifactorial With GOLD 3 copd/ chronic steroid and 02 dep  and large R effusion? Etiology already tapped s improvement  2) Prob CAP  (can't rule out malignancy either but moot point)   Long discussion with pt/ wife, not a candidate for long term vent and given the physiology of endstage emphysema, that's exactly what would happen if we intubate here.  Rec bipap prn but move on to palliative care in the next few days if not turning around.  I wish I had more to offer but I've been watching him in a state of severe decline for the last few years and did all I could  then to change his trajectory to no avail  >>> DNR order written p full discussion of above with wife and pt     Best Practice (right click and "Reselect all SmartList Selections" daily)    Per triad  Labs   CBC: Recent Labs  Lab 07/14/21 1940 07/15/21 0342 07/16/21 0414  WBC 20.2* 18.0* 22.8*  NEUTROABS 17.3* 17.3*  --   HGB 9.2* 8.1* 9.1*  HCT 29.9* 26.8* 30.3*  MCV 81.9 80.5 83.0  PLT 755* 637* 745*    Basic Metabolic Panel: Recent Labs  Lab 07/14/21 1940 07/15/21 0342 07/16/21 0414  NA 135 135 138  K 3.6 4.5 4.2  CL 98 101 101  CO2 28 26 26   GLUCOSE 169* 165* 131*  BUN 19 18 27*  CREATININE 0.87 0.73 0.95  CALCIUM 8.8* 8.5* 9.0  MG  --  2.1  --    GFR: Estimated Creatinine Clearance: 75.1 mL/min (by C-G formula based on SCr of 0.95 mg/dL). Recent Labs  Lab 07/14/21 1940 07/14/21 2204 07/14/21 2359 07/15/21 0342 07/16/21 0414  WBC 20.2*  --   --  18.0* 22.8*  LATICACIDVEN  --  2.5* 1.8  --   --     Liver Function Tests: Recent Labs  Lab 07/14/21 1940 07/15/21 0342  AST 36 40  ALT 24 24  ALKPHOS 76 60  BILITOT 0.7 0.6  PROT 7.6 7.1  ALBUMIN 3.3* 3.0*   No results for input(s): LIPASE, AMYLASE in the last 168 hours. No results for input(s): AMMONIA in the last 168 hours.  ABG    Component Value Date/Time   PHART 7.42 07/16/2021 0835   PCO2ART 49 (H) 07/16/2021 0835   PO2ART 62 (L) 07/16/2021 0835   HCO3 31.8 (H) 07/16/2021 0835   O2SAT 93.5 07/16/2021 0835     Coagulation Profile: No results for input(s): INR, PROTIME in the last 168 hours.  Cardiac Enzymes: No results for input(s): CKTOTAL, CKMB, CKMBINDEX, TROPONINI in the last 168 hours.  HbA1C: Hgb A1c MFr Bld  Date/Time Value Ref Range Status  11/23/2020 05:00 PM 6.5 (H) 4.8 - 5.6 % Final    Comment:    (NOTE) Pre diabetes:          5.7%-6.4%  Diabetes:              >6.4%  Glycemic control for   <7.0% adults with diabetes   08/26/2020 02:54 PM 6.3 (H) 4.8 - 5.6 %  Final    Comment:    (NOTE) Pre diabetes:          5.7%-6.4%  Diabetes:              >  6.4%  Glycemic control for   <7.0% adults with diabetes     CBG: No results for input(s): GLUCAP in the last 168 hours.     Past Medical History:  He,  has a past medical history of Blind right eye (1985), BPH (benign prostatic hyperplasia), Capsular cataract of left eye, COPD (chronic obstructive pulmonary disease) ([place]), Current smoker, Diverticulosis, Erectile dysfunction, Essential hypertension, H/O mitral valve prolapse, Low back pain, Mild aortic regurgitation (12/2013), and Neuropathy.   Surgical History:   Past Surgical History:  Procedure Laterality Date   ABDOMINAL AORTOGRAM W/LOWER EXTREMITY N/A 08/27/2020   Procedure: ABDOMINAL AORTOGRAM W/LOWER EXTREMITY;  Surgeon: Cherre Robins, MD;  Location: [facility];  Service: Cardiovascular;  Laterality: N/A;   AMPUTATION Right 08/28/2020   Procedure: AMPUTATION RAY FIFTH TOE;  Surgeon: Cherre Robins, MD;  Location: [facility];  Service: Vascular;  Laterality: Right;   AMPUTATION Right 11/27/2020   Procedure: RIGHT BELOW KNEE AMPUTATION;  Surgeon: Cherre Robins, MD;  [facility];  Service: Vascular;  Laterality: Right;   APPLICATION OF WOUND VAC Right 08/28/2020   Procedure: APPLICATION OF WOUND VAC, right foot;  Surgeon: Cherre Robins, MD;  Location: [facility];  Service: Vascular;  Laterality: Right;   COLON SURGERY  2000   COLONOSCOPY WITH PROPOFOL N/A 03/18/2020   Procedure: COLONOSCOPY WITH PROPOFOL;  Surgeon: Otis Brace, MD;  Location: [facility];  Service: Gastroenterology;  Laterality: N/A;   ELBOW SURGERY     POLYPECTOMY  03/18/2020   Procedure: POLYPECTOMY;  Surgeon: Otis Brace, MD;  Location: [facility];  Service: Gastroenterology;;   TRANSTHORACIC ECHOCARDIOGRAM  12/29/2016   EF 60-65%. Moderate LVH. Mild aortic valve calcification. Mild to moderate regurgitation with no stenosis. Mitral valve annular  calcification with no comment of prolapse or regurgitation.     Social History:   reports that he quit smoking about 3 years ago. His smoking use included cigarettes. He has a 100.00 pack-year smoking history. He has never used smokeless tobacco. He reports current alcohol use. He reports that he does not use drugs.   Family History:  His family history includes Brain cancer in his brother; Breast cancer in his mother; Heart disease in his father; Other in his sister.   Allergies Allergies  Allergen Reactions   Other Swelling and Other (See Comments)    Farmed Fish (tightness in throat & lip swelling)   Codeine Rash   Sulfa Antibiotics Hives     Home Medications  Prior to Admission medications   Medication Sig Start Date End Date Taking? Authorizing Provider  albuterol (PROVENTIL) (2.5 MG/3ML) 0.083% nebulizer solution USE 1 VIAL IN NEBULIZER EVERY 6 HOURS AS NEEDED FOR WHEEZING AND FOR SHORTNESS OF BREATH Patient taking differently: Take 2.5 mg by nebulization every 6 (six) hours as needed for shortness of breath or wheezing. 01/14/20  Yes Tanda Rockers, MD  aspirin EC 81 MG tablet Take 81 mg by mouth daily. Swallow whole.   Yes [provider]  atorvastatin (LIPITOR) 40 MG tablet TAKE 1 TABLET BY MOUTH ONCE DAILY . APPOINTMENT REQUIRED FOR FUTURE REFILLS 03/13/21  Yes Lendon Colonel, NP  Budeson-Glycopyrrol-Formoterol (BREZTRI AEROSPHERE) 160-9-4.8 MCG/ACT AERO Take 2 puffs first thing in am and then another 2 puffs about 12 hours later. 08/13/20  Yes Tanda Rockers, MD  furosemide (LASIX) 20 MG tablet Take 1 tablet (20 mg total) by mouth daily. 03/13/21  Yes Lendon Colonel, NP  gabapentin (NEURONTIN) 600 MG  tablet Take 0.5 tablets (300 mg total) by mouth 2 (two) times daily. Patient taking differently: Take 600 mg by mouth every 8 (eight) hours. 12/01/20  Yes Nita Sells, MD  losartan (COZAAR) 25 MG tablet Take 1 tablet (25 mg total) by mouth daily. 04/01/21   Yes Lorretta Harp, MD  Melatonin 10 MG TABS Take 1 tablet by mouth at bedtime.   Yes [provider]  Multiple Vitamin (MULTIVITAMIN WITH MINERALS) TABS tablet Take 1 tablet by mouth daily.   Yes [provider]  oxyCODONE-acetaminophen (PERCOCET) 10-325 MG tablet Take 1 tablet by mouth 4 (four) times daily as needed. 06/02/21  Yes [provider]  polyethylene glycol (MIRALAX / GLYCOLAX) 17 g packet Take 17 g by mouth daily as needed for mild constipation. 12/01/20  Yes Nita Sells, MD  predniSONE (DELTASONE) 10 MG tablet 2 until better then one daily 08/13/20  Yes Tanda Rockers, MD  PROAIR HFA 108 (90 Base) MCG/ACT inhaler INHALE 2 PUFFS BY MOUTH EVERY 6 HOURS AS NEEDED FOR WHEEZING FOR SHORTNESS OF BREATH Patient taking differently: Inhale 2 puffs into the lungs every 6 (six) hours as needed for wheezing or shortness of breath. 12/15/20  Yes Tanda Rockers, MD  vitamin B-12 (CYANOCOBALAMIN) 1000 MCG tablet Take 1,000 mcg by mouth daily.   Yes [provider]  vitamin C (ASCORBIC ACID) 500 MG tablet Take 500 mg by mouth daily.   Yes [provider]  acetaminophen (TYLENOL) 325 MG tablet Take 2 tablets (650 mg total) by mouth every 6 (six) hours. 12/01/20   Nita Sells, MD  albuterol (VENTOLIN HFA) 108 (90 Base) MCG/ACT inhaler Inhale 2 puffs into the lungs every 6 (six) hours as needed for wheezing or shortness of breath. Patient not taking: Reported on 07/14/2021 03/30/21   Tanda Rockers, MD  oxyCODONE (OXY IR/ROXICODONE) 5 MG immediate release tablet Take 1-2 tablets (5-10 mg total) by mouth every 4 (four) hours as needed for severe pain. Patient not taking: Reported on 07/14/2021 12/01/20   Nita Sells, MD  pantoprazole (PROTONIX) 40 MG tablet Take 1 tablet (40 mg total) by mouth daily. Patient not taking: Reported on 07/14/2021 12/02/20   Nita Sells, MD    The patient is critically ill with multiple organ systems  failure and requires high complexity decision making for assessment and support, frequent evaluation and titration of therapies, application of advanced monitoring technologies and extensive interpretation of multiple databases. Critical Care Time devoted to patient care services described in this note is 45 minutes.   Christinia Gully, MD Pulmonary and [place] [phone]   After 7:00 pm call Elink  [phone]

## 2021-07-16 NOTE — Progress Notes (Signed)
RRT notified MD of critical PCO2 from ABG. Value 68.

## 2021-07-16 NOTE — Progress Notes (Signed)
Speech Language Pathology Treatment: Dysphagia  Patient Details Name: Joel Perez MRN: [id number] DOB: 1946-02-14 Today's Date: 07/16/2021 Time: 8502-7741 SLP Time Calculation (min) (ACUTE ONLY): 20 min  Assessment / Plan / Recommendation Clinical Impression  Pt seen for ongoing dysphagia intervention following BSE completed yesterday. Pt, wife, and nursing staff report diminished appetite. Pt had only eaten a bite or two from his lunch tray. He consumed one bite of his [place] melt sandwich with SLP, which resulted in prolonged mastication, bolus manipulation, and oral holding (Pt eventually swallowed some and expectorated some). He took a couple single sips of water (SLP removed straws and reminded Pt and spouse about avoiding straws at this time). Pt adamantly declined additional po. Pt continues to be at risk for aspiration given severity of respiratory compromise and is at risk for not meeting nutritional needs due to poor po intake/appetite. Recommend downgrade to D2 and continue thin liquids at aid in more efficient oral swallow and hopeful improved intake (Pt requested mashed potatoes and gravy) and SLP will continue to follow. Pt and spouse are in agreement with plan of care, also relayed to MD and RN.    HPI HPI: Joel Perez is a 76 y.o. male with history of blindness in the right eye, BPH, COPD, current smoker, hypertension, neuropathy, and more presents ED with a chief complaint of dyspnea.  Patient reports that he had gradual onset of dyspnea over the last 3 weeks.  He became acutely worse about 3 or 4 hours prior to arrival.  Is associated with exertion.  Even minimal exertion like half rolling over in bed to grab something off of the bedside table causes him to be more short of breath.  He has an associated cough that is productive of light yellow sputum.  He reports that he was temporarily better when he was on prednisone which was about a week ago.  Patient reports that he has been out  of his Judithann Sauger for a few days.  He has been using his ProAir inhaler about 3 times a day.  He also uses 4 L nasal cannula at baseline.  Patient admits to palpitations but denies fever and chest pain.  Patient reports a decreased appetite over the last 3 weeks.  He reports he is barely taking any solid p.o. intake, but is drinking protein shakes.  He drinks some water as well per his report.  He denies any nausea, vomiting, diarrhea, abdominal pain, dysuria.  Patient has no other complaints at this time. Chest x-ray showed a loculated right pleural effusion, right base pneumonia. Patient did choke on oxycodone reports he takes all of his pills with bread at home. BSE requested.      SLP Plan  Continue with current plan of care      Recommendations for follow up therapy are one component of a multi-disciplinary discharge planning process, led by the attending physician.  Recommendations may be updated based on patient status, additional functional criteria and insurance authorization.    Recommendations  Diet recommendations: Dysphagia 2 (fine chop);Thin liquid Liquids provided via: Cup;No straw Medication Administration: Whole meds with liquid Supervision: Patient able to self feed;Full supervision/cueing for compensatory strategies Compensations: Slow rate;Small sips/bites;Multiple dry swallows after each bite/sip Postural Changes and/or Swallow Maneuvers: Seated upright 90 degrees;Upright 30-60 min after meal                Oral Care Recommendations: Oral care BID;Staff/trained caregiver to provide oral care Follow Up Recommendations:  (pending) Assistance  recommended at discharge: Frequent or constant Supervision/Assistance SLP Visit Diagnosis: Dysphagia, unspecified (R13.10) Plan: Continue with current plan of care         Thank you,  Genene Churn, [place]   [place]  07/16/2021, 2:03 PM

## 2021-07-16 NOTE — Progress Notes (Signed)
PROGRESS NOTE     Joel Perez, is a 76 y.o. male, DOB - [birth year], DZH:[id number]  Admit date - 07/14/2021   Admitting Physician [name], DO  Outpatient Primary MD for the patient is [name], MD  LOS - 2  Chief Complaint  Patient presents with   Shortness of Breath       Brief Summary:- a 76 y.o. male with history of blindness in the right eye, BPH, COPD, current smoker, hypertension, neuropathy admitted on 07/15/2021 with acute on chronic COPD exacerbation secondary to presumed pneumonia with worsening hypoxia and pleural effusion    -Assessment and Plan:  A/p 1) acute on chronic hypoxic and hypercapnic respiratory failure--at baseline patient uses 4 L of oxygen at home recently was increased to 5 L of oxygen -Oxygen requirement remains high decompensated overnight required BiPAP now down to 12 L of oxygen via nasal cannula -ABG initially showed uncompensated respiratory acidosis repeat ABG after BiPAP use shows compensation -Patient and wife admits to ongoing tobacco use at home and poor compliance with oxygen due to the need to smoke -Continue bronchodilators, Rocephin/azithromycin and supplemental oxygen -Pulmonology consult from Dr. [name] appreciated - Respiratory status still very tenuous -Patient is at risk for further decompensation   2) Enterococcus sepsis secondary to right-sided pneumonia--management as above #1 -Primary blood cultures with anaerobic bottle growing GPC- -Strep pneumo antigen negative Legionella pending -COVID and influenza negative -Await final culture data -WBC 22.8 (partly due to steroids) -Discussed with ID physician Dr. [name], recommends IV Unasyn for Enterococcus bacteremia -Continue azithromycin until we are sure that Legionella is negative  3) COPD exacerbation/tobacco abuse----patient with end-stage emphysema management as above #1 -Continue IV Solu-Medrol -Patient is not ready to quit smoking   4) right-sided  pleural effusion in the setting of right-sided pneumonia and underlying COPD -Status post ultrasound-guided thoracentesis on 07/15/2021 with 1.1 L removed -Fluid studies pending   5) acute on chronic anemia--Hgb is stable around 9, from a baseline around 10 continue to monitor closely  6) social/ethics--- -Overall prognosis is not good given advanced/end-stage emphysema and bacteremia -We will request palliative care consult given multiple comorbid condition and high risk for mortality -After conversation with pulmonologist patient and family request DNR/DNI status  7)FEN--- speech pathologist eval appreciated, recommended Dysphagia 2 (fine chop);Thin liquid  8) acute on chronic preserved EF/diastolic dysfunction CHF--- echo from July 2022 with EF 60-65 % -BNP up to 1599- -Gentle diuresis with IV Lasix Daily weight and fluid output and input monitoring requested  Disposition/Need for in-Hospital Stay- patient unable to be discharged at this time due to --acute on chronic hypoxic and hypercapnic respiratory failure requiring BiPAP and high flow oxygen, Enterococcus bacteremia requiring IV antibiotics -- Respiratory status still very tenuous -Patient is at risk for further decompensation  Status is: Inpatient   Disposition: The patient is from: Home              Anticipated d/c is to: Home              Anticipated d/c date is: 2 days              Patient currently is not medically stable to d/c. Barriers: Not Clinically Stable-   Code Status :  -  Code Status: DNR   Family Communication:   (patient is alert, awake and coherent)  Wife at bedside  DVT Prophylaxis  :   - SCDs  heparin injection 5,000 Units Start: 07/15/21 0600 SCDs Start: 07/15/21 0127  Lab Results  Component Value Date   PLT 745 (H) 07/16/2021    Inpatient Medications  Scheduled Meds:  aspirin EC  81 mg Oral Daily   atorvastatin  40 mg Oral Daily   chlorhexidine  15 mL Mouth Rinse BID   Chlorhexidine  Gluconate Cloth  6 each Topical Daily   feeding supplement  237 mL Oral TID BM   gabapentin  600 mg Oral TID   heparin  5,000 Units Subcutaneous Q8H   ipratropium  0.5 mg Nebulization Q6H   levalbuterol  0.63 mg Nebulization Q6H   lidocaine  1 patch Transdermal Q24H   mouth rinse  15 mL Mouth Rinse q12n4p   melatonin  9 mg Oral QHS   methylPREDNISolone (SOLU-MEDROL) injection  40 mg Intravenous Q12H   mometasone-formoterol  2 puff Inhalation BID   multivitamin with minerals  1 tablet Oral Daily   pantoprazole  40 mg Oral Daily   vitamin B-12  1,000 mcg Oral Daily   Continuous Infusions:  ampicillin-sulbactam (UNASYN) IV Stopped (07/16/21 1323)   azithromycin Stopped (07/16/21 0001)   PRN Meds:.acetaminophen **OR** acetaminophen, albuterol, ALPRAZolam, ondansetron **OR** ondansetron (ZOFRAN) IV, oxyCODONE, polyethylene glycol   Anti-infectives (From admission, onward)    Start     Dose/Rate Route Frequency Ordered Stop   07/16/21 1230  Ampicillin-Sulbactam (UNASYN) 3 g in sodium chloride 0.9 % 100 mL IVPB        3 g 200 mL/hr over 30 Minutes Intravenous Every 6 hours 07/16/21 1143     07/16/21 1145  Ampicillin-Sulbactam (UNASYN) 3 g in sodium chloride 0.9 % 100 mL IVPB  Status:  Discontinued        3 g 200 mL/hr over 30 Minutes Intravenous Every 8 hours 07/16/21 1056 07/16/21 1143   07/15/21 2000  cefTRIAXone (ROCEPHIN) 1 g in sodium chloride 0.9 % 100 mL IVPB  Status:  Discontinued        1 g 200 mL/hr over 30 Minutes Intravenous Every 24 hours 07/15/21 0126 07/16/21 1053   07/14/21 2200  cefTRIAXone (ROCEPHIN) 1 g in sodium chloride 0.9 % 100 mL IVPB        1 g 200 mL/hr over 30 Minutes Intravenous  Once 07/14/21 2145 07/14/21 2235   07/14/21 2200  azithromycin (ZITHROMAX) 500 mg in sodium chloride 0.9 % 250 mL IVPB        500 mg 250 mL/hr over 60 Minutes Intravenous Every 24 hours 07/14/21 2145           Subjective: Mayer Camel today has no fevers, no emesis,  No chest  pain,   - Wife at bedside -Respiratory distress, requiring high flow oxygen -Increased work of breathing   Objective: Vitals:   07/16/21 1500 07/16/21 1559 07/16/21 1600 07/16/21 1613  BP: (!) 119/50 127/75 127/75   Pulse:  (!) 110 (!) 111 (!) 107  Resp: (!) 36 (!) 31 (!) 34 (!) 31  Temp:      TempSrc:      SpO2:  (!) 89% (!) 89% 97%  Weight:      Height:        Intake/Output Summary (Last 24 hours) at 07/16/2021 1725 Last data filed at 07/16/2021 1545 Gross per 24 hour  Intake 500 ml  Output 850 ml  Net -350 ml   Filed Weights   07/14/21 1931 07/15/21 0030 07/16/21 0438  Weight: 99.8 kg 97.8 kg 94.3 kg    Physical Exam  Gen:- Awake Alert, patient is a "pink puffer"  HEENT:- [place].AT, No sclera icterus Neck-Supple Neck,No JVD,.  Lungs-diminished breath sounds, scattered rhonchi bilaterally  CV- S1, S2 normal, regular  Abd-  +ve B.Sounds, Abd Soft, No tenderness,    Extremity/Skin:- No  edema, pedal pulses present  Psych-affect is appropriate, oriented x3 Neuro-generalized weakness no new focal deficits, no tremors MSK-right BKA  Data Reviewed: I have personally reviewed following labs and imaging studies  CBC: Recent Labs  Lab 07/14/21 1940 07/15/21 0342 07/16/21 0414  WBC 20.2* 18.0* 22.8*  NEUTROABS 17.3* 17.3*  --   HGB 9.2* 8.1* 9.1*  HCT 29.9* 26.8* 30.3*  MCV 81.9 80.5 83.0  PLT 755* 637* 939*   Basic Metabolic Panel: Recent Labs  Lab 07/14/21 1940 07/15/21 0342 07/16/21 0414  NA 135 135 138  K 3.6 4.5 4.2  CL 98 101 101  CO2 28 26 26   GLUCOSE 169* 165* 131*  BUN 19 18 27*  CREATININE 0.87 0.73 0.95  CALCIUM 8.8* 8.5* 9.0  MG  --  2.1  --    GFR: Estimated Creatinine Clearance: 75.1 mL/min (by C-G formula based on SCr of 0.95 mg/dL). Liver Function Tests: Recent Labs  Lab 07/14/21 1940 07/15/21 0342  AST 36 40  ALT 24 24  ALKPHOS 76 60  BILITOT 0.7 0.6  PROT 7.6 7.1  ALBUMIN 3.3* 3.0*   Cardiac Enzymes: No results for  input(s): CKTOTAL, CKMB, CKMBINDEX, TROPONINI in the last 168 hours. BNP (last 3 results) No results for input(s): PROBNP in the last 8760 hours. HbA1C: No results for input(s): HGBA1C in the last 72 hours. Sepsis Labs: @LABRCNTIP (procalcitonin:4,lacticidven:4) ) Recent Results (from the past 240 hour(s))  Resp Panel by RT-PCR (Flu A&B, Covid) Nasopharyngeal Swab     Status: None   Collection Time: 07/14/21  8:47 PM   Specimen: Nasopharyngeal Swab; Nasopharyngeal(NP) swabs in vial transport medium  Result Value Ref Range Status   SARS Coronavirus 2 by RT PCR NEGATIVE NEGATIVE Final    Comment: (NOTE) SARS-CoV-2 target nucleic acids are NOT DETECTED.  The SARS-CoV-2 RNA is generally detectable in upper respiratory specimens during the acute phase of infection. The lowest concentration of SARS-CoV-2 viral copies this assay can detect is 138 copies/mL. A negative result does not preclude SARS-Cov-2 infection and should not be used as the sole basis for treatment or other patient management decisions. A negative result may occur with  improper specimen collection/handling, submission of specimen other than nasopharyngeal swab, presence of viral mutation(s) within the areas targeted by this assay, and inadequate number of viral copies(<138 copies/mL). A negative result must be combined with clinical observations, patient history, and epidemiological information. The expected result is Negative.  Fact Sheet for Patients:  EntrepreneurPulse.com.au  Fact Sheet for Healthcare Providers:  IncredibleEmployment.be  This test is no t yet approved or cleared by the [place] FDA and  has been authorized for detection and/or diagnosis of SARS-CoV-2 by FDA under an Emergency Use Authorization (EUA). This EUA will remain  in effect (meaning this test can be used) for the duration of the COVID-19 declaration under Section 564(b)(1) of the Act,  21 U.S.C.section 360bbb-3(b)(1), unless the authorization is terminated  or revoked sooner.       Influenza A by PCR NEGATIVE NEGATIVE Final   Influenza B by PCR NEGATIVE NEGATIVE Final    Comment: (NOTE) The Xpert Xpress SARS-CoV-2/FLU/RSV plus assay is intended as an aid in the diagnosis of influenza from Nasopharyngeal swab specimens and should not be used as a sole basis for  treatment. Nasal washings and aspirates are unacceptable for Xpert Xpress SARS-CoV-2/FLU/RSV testing.  Fact Sheet for Patients: EntrepreneurPulse.com.au  Fact Sheet for Healthcare Providers: IncredibleEmployment.be  This test is not yet approved or cleared by the [place] FDA and has been authorized for detection and/or diagnosis of SARS-CoV-2 by FDA under an Emergency Use Authorization (EUA). This EUA will remain in effect (meaning this test can be used) for the duration of the COVID-19 declaration under Section 564(b)(1) of the Act, 21 U.S.C. section 360bbb-3(b)(1), unless the authorization is terminated or revoked.  Performed at [facility], [address] [place], [place] [address]   Culture, blood (routine x 2)     Status: Abnormal (Preliminary result)   Collection Time: 07/14/21 10:04 PM   Specimen: BLOOD  Result Value Ref Range Status   Specimen Description   Final    BLOOD RIGHT ANTECUBITAL Performed at [facility], [address] [place], [place] [address]    Special Requests   Final    BOTTLES DRAWN AEROBIC AND ANAEROBIC Blood Culture adequate volume Performed at [facility], [address], [place], [place] [address]    Culture  Setup Time   Final    ANAEROBIC BOTTLE ONLY aerobic bottle GRAM POSITIVE COCCI Gram Stain Report Called to,Read Back By and Verified With: KATIE LOOMIS @ 0093 ON 07/15/21 C VARNER RN DUSTIN EVERETT 07/16/21@00 :39 BY TW IN BOTH AEROBIC AND ANAEROBIC BOTTLES    Culture (A)  Final    ENTEROCOCCUS FAECALIS CULTURE  REINCUBATED FOR BETTER GROWTH Performed at [facility], [address], [place], [place] [address]    Report Status PENDING  Incomplete  Culture, blood (routine x 2)     Status: None (Preliminary result)   Collection Time: 07/14/21 10:04 PM   Specimen: BLOOD  Result Value Ref Range Status   Specimen Description BLOOD LEFT ANTECUBITAL  Final   Special Requests   Final    BOTTLES DRAWN AEROBIC AND ANAEROBIC Blood Culture adequate volume   Culture   Final    NO GROWTH 2 DAYS Performed at [facility], [address] [place], [place] [address]    Report Status PENDING  Incomplete  Blood Culture ID Panel (Reflexed)     Status: Abnormal   Collection Time: 07/14/21 10:04 PM  Result Value Ref Range Status   Enterococcus faecalis DETECTED (A) NOT DETECTED Final    Comment: CRITICAL RESULT CALLED TO, READ BACK BY AND VERIFIED WITH: RN DUSTIN EVERETT 07/16/21@00 :39 BY TW    Enterococcus Faecium NOT DETECTED NOT DETECTED Final   Listeria monocytogenes NOT DETECTED NOT DETECTED Final   Staphylococcus species NOT DETECTED NOT DETECTED Final   Staphylococcus aureus (BCID) NOT DETECTED NOT DETECTED Final   Staphylococcus epidermidis NOT DETECTED NOT DETECTED Final   Staphylococcus lugdunensis NOT DETECTED NOT DETECTED Final   Streptococcus species NOT DETECTED NOT DETECTED Final   Streptococcus agalactiae NOT DETECTED NOT DETECTED Final   Streptococcus pneumoniae NOT DETECTED NOT DETECTED Final   Streptococcus pyogenes NOT DETECTED NOT DETECTED Final   A.calcoaceticus-baumannii NOT DETECTED NOT DETECTED Final   Bacteroides fragilis NOT DETECTED NOT DETECTED Final   Enterobacterales NOT DETECTED NOT DETECTED Final   Enterobacter cloacae complex NOT DETECTED NOT DETECTED Final   Escherichia coli NOT DETECTED NOT DETECTED Final   Klebsiella aerogenes NOT DETECTED NOT DETECTED Final   Klebsiella oxytoca NOT DETECTED NOT DETECTED Final   Klebsiella pneumoniae NOT DETECTED NOT DETECTED Final    Proteus species NOT DETECTED NOT DETECTED Final  Salmonella species NOT DETECTED NOT DETECTED Final   Serratia marcescens NOT DETECTED NOT DETECTED Final   Haemophilus influenzae NOT DETECTED NOT DETECTED Final   Neisseria meningitidis NOT DETECTED NOT DETECTED Final   Pseudomonas aeruginosa NOT DETECTED NOT DETECTED Final   Stenotrophomonas maltophilia NOT DETECTED NOT DETECTED Final   Candida albicans NOT DETECTED NOT DETECTED Final   Candida auris NOT DETECTED NOT DETECTED Final   Candida glabrata NOT DETECTED NOT DETECTED Final   Candida krusei NOT DETECTED NOT DETECTED Final   Candida parapsilosis NOT DETECTED NOT DETECTED Final   Candida tropicalis NOT DETECTED NOT DETECTED Final   Cryptococcus neoformans/gattii NOT DETECTED NOT DETECTED Final   Vancomycin resistance NOT DETECTED NOT DETECTED Final    Comment: Performed at [facility], [address], [place], [place] [address]  MRSA Next Gen by PCR, Nasal     Status: None   Collection Time: 07/15/21 12:56 AM   Specimen: Nasal Mucosa; Nasal Swab  Result Value Ref Range Status   MRSA by PCR Next Gen NOT DETECTED NOT DETECTED Final    Comment: (NOTE) The GeneXpert MRSA Assay (FDA approved for NASAL specimens only), is one component of a comprehensive MRSA colonization surveillance program. It is not intended to diagnose MRSA infection nor to guide or monitor treatment for MRSA infections. Test performance is not FDA approved in patients less than 98 years old. Performed at [facility], [address], [place], [place] [address]   Culture, body fluid w Gram Stain-bottle     Status: None (Preliminary result)   Collection Time: 07/15/21 11:15 AM   Specimen: Pleura  Result Value Ref Range Status   Specimen Description PLEURAL  Final   Special Requests   Final    BOTTLES DRAWN AEROBIC AND ANAEROBIC Blood Culture adequate volume   Culture   Final    NO GROWTH < 24 HOURS Performed at [facility], [address]  [place], [place] [address]    Report Status PENDING  Incomplete  Gram stain     Status: None   Collection Time: 07/15/21 11:15 AM   Specimen: Pleura  Result Value Ref Range Status   Specimen Description PLEURAL  Final   Special Requests NONE  Final   Gram Stain   Final    WBC PRESENT, PREDOMINANTLY MONONUCLEAR NO ORGANISMS SEEN CYTOSPIN SMEAR Performed at [facility], [address], [place], [place] [address]    Report Status 07/15/2021 FINAL  Final      Radiology Studies: DG Chest 1 View  Result Date: 07/15/2021 CLINICAL DATA:  Post RIGHT thoracentesis, history hypertension, COPD, RIGHT upper lobe nodule EXAM: CHEST  1 VIEW COMPARISON:  07/14/2021 FINDINGS: Enlargement of cardiac silhouette. Mediastinal contours stable with atherosclerotic calcification aorta noted. Again identified RIGHT upper lobe nodular density and RIGHT basilar atelectasis. Decreased RIGHT pleural effusion. No pneumothorax following thoracentesis. IMPRESSION: No pneumothorax following thoracentesis. Persistent RIGHT upper lobe nodule. Electronically Signed   By: Lavonia Dana M.D.   On: 07/15/2021 11:41   DG Chest Port 1 View  Result Date: 07/16/2021 CLINICAL DATA:  Worsening hypoxia. EXAM: PORTABLE CHEST 1 VIEW COMPARISON:  July 15, 2021 FINDINGS: Mild to moderate severity diffusely increased interstitial lung markings are seen. Stable mild to moderate severity areas of airspace disease are seen within the retrocardiac region of the left lung base and periphery of the upper right lung. There is no evidence of a pleural effusion or pneumothorax. The cardiac silhouette is mildly enlarged and unchanged in  size. The visualized skeletal structures are unremarkable. IMPRESSION: 1. Stable mild to moderate severity left basilar and mid to upper right lung airspace disease. Correlation with chest CT is recommended to exclude an underlying neoplastic process. 2. Mild to moderate severity interstitial edema. Electronically Signed    By: Virgina Norfolk M.D.   On: 07/16/2021 00:32   DG Chest Portable 1 View  Result Date: 07/14/2021 CLINICAL DATA:  Dyspnea EXAM: PORTABLE CHEST 1 VIEW COMPARISON:  11/27/2020, CT 07/22/2020 FINDINGS: Emphysematous disease and bronchitic changes. The left lung is grossly clear. Small right-sided pleural effusion with possible loculation. Vague right upper lobe opacity measuring 3.2 cm, probably representing interval growth of previously noted lung nodule. Stable cardiomediastinal silhouette. Aortic atherosclerosis. Airspace disease at the right base IMPRESSION: 1. Emphysema with small probably loculated right pleural effusion and airspace disease at the right base which may be due to atelectasis or pneumonia 2. Ill-defined right upper lobe opacity likely represents enlargement of previously noted pulmonary nodule, recommend follow-up chest CT. Electronically Signed   By: Donavan Foil M.D.   On: 07/14/2021 20:57   US THORACENTESIS ASP PLEURAL SPACE W/IMG GUIDE  Result Date: 07/15/2021 INDICATION: Right pleural effusion COPD exacerbation EXAM: ULTRASOUND GUIDED Right THORACENTESIS MEDICATIONS: 10 cc 15 lidocaine. COMPLICATIONS: None immediate. PROCEDURE: An ultrasound guided thoracentesis was thoroughly discussed with the patient and questions answered. The benefits, risks, alternatives and complications were also discussed. The patient understands and wishes to proceed with the procedure. Written consent was obtained. Ultrasound was performed to localize and mark an adequate pocket of fluid in the right chest. The area was then prepped and draped in the normal sterile fashion. 1% Lidocaine was used for local anesthesia. Under ultrasound guidance a Yueh catheter was introduced. Thoracentesis was performed. The catheter was removed and a dressing applied. FINDINGS: A total of approximately 1.1 liter of clear yellow fluid was removed. Samples were sent to the laboratory as requested by the clinical team.  IMPRESSION: Successful ultrasound guided R thoracentesis yielding 1.1 Liters of pleural fluid. Read by Monia Sabal [facility] Electronically Signed   By: Lavonia Dana M.D.   On: 07/15/2021 11:41     Scheduled Meds:  aspirin EC  81 mg Oral Daily   atorvastatin  40 mg Oral Daily   chlorhexidine  15 mL Mouth Rinse BID   Chlorhexidine Gluconate Cloth  6 each Topical Daily   feeding supplement  237 mL Oral TID BM   gabapentin  600 mg Oral TID   heparin  5,000 Units Subcutaneous Q8H   ipratropium  0.5 mg Nebulization Q6H   levalbuterol  0.63 mg Nebulization Q6H   lidocaine  1 patch Transdermal Q24H   mouth rinse  15 mL Mouth Rinse q12n4p   melatonin  9 mg Oral QHS   methylPREDNISolone (SOLU-MEDROL) injection  40 mg Intravenous Q12H   mometasone-formoterol  2 puff Inhalation BID   multivitamin with minerals  1 tablet Oral Daily   pantoprazole  40 mg Oral Daily   vitamin B-12  1,000 mcg Oral Daily   Continuous Infusions:  ampicillin-sulbactam (UNASYN) IV Stopped (07/16/21 1323)   azithromycin Stopped (07/16/21 0001)     LOS: 2 days    Roxan Hockey M.D on 07/16/2021 at 5:25 PM  Go to [email or web address] - for contact info  [facility] - Office  [phone]  If 7PM-7AM, please contact night-coverage [email or web address] Password [email or web address] 07/16/2021, 5:25 PM

## 2021-07-16 NOTE — Progress Notes (Addendum)
RN called due to patient's increased oxygen requirement from 6 LPM to 15 LPM via NRB and increased work of breathing. ABG was done and showed respiratory failure with hypoxia and hypercapnia.  Patient was placed on BiPAP Chest x-ray was done and showed mild to moderate severity interstitial edema. IV Lasix 20 mg x 1 was given. Continue BiPAP.  Total time:  10 minutes This includes time reviewing the chart including progress notes, labs, EKGs, taking medical decisions, ordering labs and documenting findings.

## 2021-07-16 NOTE — Progress Notes (Signed)
Initial Nutrition Assessment  DOCUMENTATION CODES:   Not applicable  INTERVENTION:  - Encourage adequate PO intake - Increase Ensure Enlive po to TID, each supplement provides 350 kcal and 20 grams of protein. - Magic cup TID with meals, each supplement provides 290 kcal and 9 grams of protein - MVI with minerals daily  NUTRITION DIAGNOSIS:   Increased nutrient needs related to acute illness as evidenced by estimated needs.  GOAL:   Patient will meet greater than or equal to 90% of their needs  MONITOR:   PO intake, Supplement acceptance, Labs, Weight trends  REASON FOR ASSESSMENT:   Other (Comment) (Poor PO intake)    ASSESSMENT:   Pt admitted from home with SOB secondary to acute on chronic respiratory failure with hypoxia. PMH includes R eye blindness, BPH, COPD, current smoker, HTN, and neuropathy.  RD working remotely unable to reach pt by phone. SLP following pt and performed bedside swallow evaluation and initially placed pt on dysphagia 3 diet but has since been downgraded to a dysphagia 2 d/t risk for aspiration given poor respiratory status and likely impaired coordination of respiration and swallow.   1 meal completion documented of 15%  Per review of chart, pt noted to have decrease in appetite x3 weeks. He barely takes any solid PO and is relying on protein shakes and water. Will increase supplements and add Magic Cup to enhance nutritional adequacy and adjust as necessary  Pt is DNR and not candidate for long term vent. Noted possible transition to palliative care. Will continue to monitor.  Per review of chart, pt with a 16% weight loss from March 2022 to this admission.   Suspect a degree of malnutrition present, unable to confirm at this time. Will continue to follow pt and reassess as able.  Medications: melatonin, protonix, Vitamin B12, IV abx  Labs: BUN 27   NUTRITION - FOCUSED PHYSICAL EXAM: RD working remotely- deferred to follow up  Diet  Order:   Diet Order             DIET DYS 2 Room service appropriate? Yes; Fluid consistency: Thin  Diet effective now                   EDUCATION NEEDS:   No education needs have been identified at this time  Skin:  Skin Assessment: Reviewed RN Assessment  Last BM:  2/21  Height:   Ht Readings from Last 1 Encounters:  07/15/21 5\' 10"  (1.778 m)    Weight:   Wt Readings from Last 1 Encounters:  07/16/21 94.3 kg    BMI:  Body mass index is 29.83 kg/m.  Estimated Nutritional Needs:   Kcal:  2150-2350  Protein:  110-125g  Fluid:  >/=2.1L  Clayborne Dana, RDN, LDN Clinical Nutrition

## 2021-07-16 NOTE — Progress Notes (Signed)
**Note De-Identified  Obfuscation** Patient removed from BIPAP and placed on 15L HFNC; tolerating well at this time, VS WNL SAT 100&.  RRT to continue to monitor.

## 2021-07-16 NOTE — Consult Note (Signed)
[facility] for Infectious Disease  Total days of antibiotics 2               Reason for Consult:enterococcal bacteremia-    Referring Physician: [email or web address]  Principal Problem:   Acute on chronic respiratory failure with hypoxia ([name]) Active Problems:   COPD with acute exacerbation (HCC)   Sepsis ([place])   RLL pneumonia   Mild protein-calorie malnutrition (HCC)   GERD (gastroesophageal reflux disease)   HTN (hypertension)    HPI: Joel Perez is a 76 y.o. male hx of COPD with 4L [place] sup oxygen need,HTN, DM, hx of right BKA, was admitted for sepsis with acute respiratory distress. Imaging revealed multifocal pneumonia with right sided effusion. sHe required bipap and started on broad specturm abtx. He underwent thoracentesis. Infectious work up found to have enterococcal bacteremia thus far. In the first 24hrs of hospitalization appears improved. No longer using bipap, not on any vasopressors. His wife who is at his bedside reported that they felt poorly roughly 3 weeks ago, tested negative for covid by home ag kits x 2 and had started to improve-however, he acute decompensated at home with acute shortness of breath -calling EMS. No fever, chills, nightsweats. No diarrhea nor dysuria, but had poor oral intake.   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 [name]   Current smoker    Long-term smoker. Not interested in quitting   Diverticulosis    With intermittent diverticulitis   Erectile dysfunction    Essential hypertension    Several recorded blood pressures greater than 140/90.   H/O mitral valve prolapse    By report, but not confirmed by echo.   Low back pain    Chronic. Followed by Dr. Suella Broad   Mild aortic regurgitation 12/2013   Mild to moderate aortic regurgitation on echo   Neuropathy      Allergies:  Allergies  Allergen Reactions   Other Swelling and Other (See Comments)    Farmed Fish (tightness in throat & lip swelling)   Codeine Rash   Sulfa Antibiotics Hives    MEDICATIONS:  aspirin EC  81 mg Oral Daily   atorvastatin  40 mg Oral Daily   chlorhexidine  15 mL Mouth Rinse BID   Chlorhexidine Gluconate Cloth  6 each Topical Daily   feeding supplement  237 mL Oral BID BM   gabapentin  600 mg Oral TID   heparin  5,000 Units Subcutaneous Q8H   ipratropium  0.5 mg Nebulization Q6H   levalbuterol  0.63 mg Nebulization Q6H   lidocaine  1 patch Transdermal Q24H   mouth rinse  15 mL Mouth Rinse q12n4p   melatonin  9 mg Oral QHS   methylPREDNISolone (SOLU-MEDROL) injection  40 mg Intravenous Q12H   mometasone-formoterol  2 puff Inhalation BID   pantoprazole  40 mg Oral Daily   vitamin B-12  1,000 mcg Oral Daily    Social History   Tobacco Use   Smoking status: Former    Packs/day: 2.00    Years: 50.00    Pack years: 100.00    Types: Cigarettes    Quit date: 03/16/2018    Years since quitting: 3.3   Smokeless tobacco: Never  Vaping Use   Vaping Use: Some days  Start date: 03/16/2018  Substance Use Topics   Alcohol use: Yes    Alcohol/week: 0.0 standard drinks   Drug use: No    Family History  Problem Relation Age of Onset   Heart disease Father    Breast cancer Mother    Other Sister    Brain cancer Brother     Review of Systems -  12 point ros -- +shortness of breath, fatigue, decreased appetite, other systems are negative  OBJECTIVE: Temp:  [97.2 F (36.2 C)-98.7 F (37.1 C)] 98.3 F (36.8 C) (02/23 0749) Pulse Rate:  [74-127] 74 (02/23 1000) Resp:  [15-32] 20 (02/23 1000) BP: (106-158)/(60-87) 158/69 (02/23 1000) SpO2:  [84 %-100 %] 84 % (02/23 1000) FiO2 (%):  [35 %-60 %] 35 % (02/23 0749) Weight:  [94.3 kg] 94.3 kg (02/23 0438) Physical Exam  Constitutional: He is oriented to person, place, and time. He appears well-developed  and well-nourished. No distress.  HENT:  Mouth/Throat: Oropharynx is clear and moist. No oropharyngeal exudate.  Cardiovascular: tachycardia on monitoring Pulmonary/Chest: Effort normal and no acc. muscle use. No auditory wheezing or coughing. Wearing supplemental oxygen Abdominal: protuberant abdomen There is no tenderness.  Neurological: He is alert and oriented to person, place, and time.  Skin: Skin is warm and dry. No rash noted. No erythema.  Ext: right bka Psychiatric: He has a normal mood and affect. His behavior is normal.    LABS: Results for orders placed or performed during the hospital encounter of 07/14/21 (from the past 48 hour(s))  Comprehensive metabolic panel     Status: Abnormal   Collection Time: 07/14/21  7:40 PM  Result Value Ref Range   Sodium 135 135 - 145 mmol/L   Potassium 3.6 3.5 - 5.1 mmol/L   Chloride 98 98 - 111 mmol/L   CO2 28 22 - 32 mmol/L   Glucose, Bld 169 (H) 70 - 99 mg/dL    Comment: Glucose reference range applies only to samples taken after fasting for at least 8 hours.   BUN 19 8 - 23 mg/dL   Creatinine, Ser 0.87 0.61 - 1.24 mg/dL   Calcium 8.8 (L) 8.9 - 10.3 mg/dL   Total Protein 7.6 6.5 - 8.1 g/dL   Albumin 3.3 (L) 3.5 - 5.0 g/dL   AST 36 15 - 41 U/L   ALT 24 0 - 44 U/L   Alkaline Phosphatase 76 38 - 126 U/L   Total Bilirubin 0.7 0.3 - 1.2 mg/dL   GFR, Estimated >60 >60 mL/min    Comment: (NOTE) Calculated using the CKD-EPI Creatinine Equation (2021)    Anion gap 9 5 - 15    Comment: Performed at [facility], [address], [place], [place] [address]  CBC with Differential     Status: Abnormal   Collection Time: 07/14/21  7:40 PM  Result Value Ref Range   WBC 20.2 (H) 4.0 - 10.5 K/uL   RBC 3.65 (L) 4.22 - 5.81 MIL/uL   Hemoglobin 9.2 (L) 13.0 - 17.0 g/dL   HCT 29.9 (L) 39.0 - 52.0 %   MCV 81.9 80.0 - 100.0 fL   MCH 25.2 (L) 26.0 - 34.0 pg   MCHC 30.8 30.0 - 36.0 g/dL   RDW 16.3 (H) 11.5 - 15.5 %   Platelets 755 (H) 150 - 400  K/uL   nRBC 0.0 0.0 - 0.2 %   Neutrophils Relative % 86 %   Neutro Abs 17.3 (H) 1.7 - 7.7 K/uL   Lymphocytes Relative  7 %   Lymphs Abs 1.4 0.7 - 4.0 K/uL   Monocytes Relative 5 %   Monocytes Absolute 1.0 0.1 - 1.0 K/uL   Eosinophils Relative 1 %   Eosinophils Absolute 0.3 0.0 - 0.5 K/uL   Basophils Relative 0 %   Basophils Absolute 0.1 0.0 - 0.1 K/uL   Immature Granulocytes 1 %   Abs Immature Granulocytes 0.15 (H) 0.00 - 0.07 K/uL    Comment: Performed at [facility], [address], [place], [place] [address]  Resp Panel by RT-PCR (Flu A&B, Covid) Nasopharyngeal Swab     Status: None   Collection Time: 07/14/21  8:47 PM   Specimen: Nasopharyngeal Swab; Nasopharyngeal(NP) swabs in vial transport medium  Result Value Ref Range   SARS Coronavirus 2 by RT PCR NEGATIVE NEGATIVE    Comment: (NOTE) SARS-CoV-2 target nucleic acids are NOT DETECTED.  The SARS-CoV-2 RNA is generally detectable in upper respiratory specimens during the acute phase of infection. The lowest concentration of SARS-CoV-2 viral copies this assay can detect is 138 copies/mL. A negative result does not preclude SARS-Cov-2 infection and should not be used as the sole basis for treatment or other patient management decisions. A negative result may occur with  improper specimen collection/handling, submission of specimen other than nasopharyngeal swab, presence of viral mutation(s) within the areas targeted by this assay, and inadequate number of viral copies(<138 copies/mL). A negative result must be combined with clinical observations, patient history, and epidemiological information. The expected result is Negative.  Fact Sheet for Patients:  EntrepreneurPulse.com.au  Fact Sheet for Healthcare Providers:  IncredibleEmployment.be  This test is no t yet approved or cleared by the [place] FDA and  has been authorized for detection and/or diagnosis of SARS-CoV-2 by FDA  under an Emergency Use Authorization (EUA). This EUA will remain  in effect (meaning this test can be used) for the duration of the COVID-19 declaration under Section 564(b)(1) of the Act, 21 U.S.C.section 360bbb-3(b)(1), unless the authorization is terminated  or revoked sooner.       Influenza A by PCR NEGATIVE NEGATIVE   Influenza B by PCR NEGATIVE NEGATIVE    Comment: (NOTE) The Xpert Xpress SARS-CoV-2/FLU/RSV plus assay is intended as an aid in the diagnosis of influenza from Nasopharyngeal swab specimens and should not be used as a sole basis for treatment. Nasal washings and aspirates are unacceptable for Xpert Xpress SARS-CoV-2/FLU/RSV testing.  Fact Sheet for Patients: EntrepreneurPulse.com.au  Fact Sheet for Healthcare Providers: IncredibleEmployment.be  This test is not yet approved or cleared by the [place] FDA and has been authorized for detection and/or diagnosis of SARS-CoV-2 by FDA under an Emergency Use Authorization (EUA). This EUA will remain in effect (meaning this test can be used) for the duration of the COVID-19 declaration under Section 564(b)(1) of the Act, 21 U.S.C. section 360bbb-3(b)(1), unless the authorization is terminated or revoked.  Performed at [facility], [address] [place], [place] [address]   Culture, blood (routine x 2)     Status: Abnormal (Preliminary result)   Collection Time: 07/14/21 10:04 PM   Specimen: BLOOD  Result Value Ref Range   Specimen Description      BLOOD RIGHT ANTECUBITAL Performed at [facility], [address] [place], [place] [address]    Special Requests      BOTTLES DRAWN AEROBIC AND ANAEROBIC Blood Culture adequate volume Performed at [facility], [address] [place],  [address]    Culture  Setup Time  ANAEROBIC BOTTLE ONLY aerobic bottle GRAM POSITIVE COCCI Gram Stain Report Called to,Read Back By and Verified With: KATIE LOOMIS @ 4008 ON  07/15/21 C VARNER RN DUSTIN EVERETT 07/16/21@00 :39 BY TW IN BOTH AEROBIC AND ANAEROBIC BOTTLES    Culture (A)     ENTEROCOCCUS FAECALIS CULTURE REINCUBATED FOR BETTER GROWTH Performed at [facility], [place] [address], [place], [place] [address]    Report Status PENDING   Culture, blood (routine x 2)     Status: None (Preliminary result)   Collection Time: 07/14/21 10:04 PM   Specimen: BLOOD  Result Value Ref Range   Specimen Description BLOOD LEFT ANTECUBITAL    Special Requests      BOTTLES DRAWN AEROBIC AND ANAEROBIC Blood Culture adequate volume   Culture      NO GROWTH 2 DAYS Performed at [facility], [address], [place], [place] [address]    Report Status PENDING   Lactic acid, plasma     Status: Abnormal   Collection Time: 07/14/21 10:04 PM  Result Value Ref Range   Lactic Acid, Venous 2.5 (HH) 0.5 - 1.9 mmol/L    Comment: CRITICAL RESULT CALLED TO, READ BACK BY AND VERIFIED WITH: MERRICKS,L ON 07/14/21 AT 2305 BY LOY,C Performed at [facility], [address], [place], [place] [address]   Blood Culture ID Panel (Reflexed)     Status: Abnormal   Collection Time: 07/14/21 10:04 PM  Result Value Ref Range   Enterococcus faecalis DETECTED (A) NOT DETECTED    Comment: CRITICAL RESULT CALLED TO, READ BACK BY AND VERIFIED WITH: RN DUSTIN EVERETT 07/16/21@00 :39 BY TW    Enterococcus Faecium NOT DETECTED NOT DETECTED   Listeria monocytogenes NOT DETECTED NOT DETECTED   Staphylococcus species NOT DETECTED NOT DETECTED   Staphylococcus aureus (BCID) NOT DETECTED NOT DETECTED   Staphylococcus epidermidis NOT DETECTED NOT DETECTED   Staphylococcus lugdunensis NOT DETECTED NOT DETECTED   Streptococcus species NOT DETECTED NOT DETECTED   Streptococcus agalactiae NOT DETECTED NOT DETECTED   Streptococcus pneumoniae NOT DETECTED NOT DETECTED   Streptococcus pyogenes NOT DETECTED NOT DETECTED   A.calcoaceticus-baumannii NOT DETECTED NOT DETECTED   Bacteroides fragilis NOT  DETECTED NOT DETECTED   Enterobacterales NOT DETECTED NOT DETECTED   Enterobacter cloacae complex NOT DETECTED NOT DETECTED   Escherichia coli NOT DETECTED NOT DETECTED   Klebsiella aerogenes NOT DETECTED NOT DETECTED   Klebsiella oxytoca NOT DETECTED NOT DETECTED   Klebsiella pneumoniae NOT DETECTED NOT DETECTED   Proteus species NOT DETECTED NOT DETECTED   Salmonella species NOT DETECTED NOT DETECTED   Serratia marcescens NOT DETECTED NOT DETECTED   Haemophilus influenzae NOT DETECTED NOT DETECTED   Neisseria meningitidis NOT DETECTED NOT DETECTED   Pseudomonas aeruginosa NOT DETECTED NOT DETECTED   Stenotrophomonas maltophilia NOT DETECTED NOT DETECTED   Candida albicans NOT DETECTED NOT DETECTED   Candida auris NOT DETECTED NOT DETECTED   Candida glabrata NOT DETECTED NOT DETECTED   Candida krusei NOT DETECTED NOT DETECTED   Candida parapsilosis NOT DETECTED NOT DETECTED   Candida tropicalis NOT DETECTED NOT DETECTED   Cryptococcus neoformans/gattii NOT DETECTED NOT DETECTED   Vancomycin resistance NOT DETECTED NOT DETECTED    Comment: Performed at [facility], [address] [place], [place] [address]  Blood gas, venous     Status: Abnormal   Collection Time: 07/14/21 11:06 PM  Result Value Ref Range   FIO2 40.0 %   pH, Ven 7.37 7.25 - 7.43   pCO2,  Ven 52 44 - 60 mmHg   pO2, Ven 46 (H) 32 - 45 mmHg   Bicarbonate 30.6 (H) 20.0 - 28.0 mmol/L   Acid-Base Excess 4.6 (H) 0.0 - 2.0 mmol/L   O2 Saturation 75.9 %   Patient temperature 36.7    Collection site BLOOD RIGHT HAND    Drawn by [id number]     Comment: Performed at [facility], [address] [place], [place] [address]  Lactic acid, plasma     Status: None   Collection Time: 07/14/21 11:59 PM  Result Value Ref Range   Lactic Acid, Venous 1.8 0.5 - 1.9 mmol/L    Comment: Performed at [facility], [address], [place], [place] [address]  MRSA Next Gen by PCR, Nasal     Status: None   Collection Time: 07/15/21 12:56  AM   Specimen: Nasal Mucosa; Nasal Swab  Result Value Ref Range   MRSA by PCR Next Gen NOT DETECTED NOT DETECTED    Comment: (NOTE) The GeneXpert MRSA Assay (FDA approved for NASAL specimens only), is one component of a comprehensive MRSA colonization surveillance program. It is not intended to diagnose MRSA infection nor to guide or monitor treatment for MRSA infections. Test performance is not FDA approved in patients less than 10 years old. Performed at [facility], [address], [place], [place] [address]   Strep pneumoniae urinary antigen     Status: None   Collection Time: 07/15/21  1:27 AM  Result Value Ref Range   Strep Pneumo Urinary Antigen NEGATIVE NEGATIVE    Comment:        Infection due to S. pneumoniae cannot be absolutely ruled out since the antigen present may be below the detection limit of the test. Performed at [facility], [address] [place], [place] [address]   Comprehensive metabolic panel     Status: Abnormal   Collection Time: 07/15/21  3:42 AM  Result Value Ref Range   Sodium 135 135 - 145 mmol/L   Potassium 4.5 3.5 - 5.1 mmol/L    Comment: DELTA CHECK NOTED   Chloride 101 98 - 111 mmol/L   CO2 26 22 - 32 mmol/L   Glucose, Bld 165 (H) 70 - 99 mg/dL    Comment: Glucose reference range applies only to samples taken after fasting for at least 8 hours.   BUN 18 8 - 23 mg/dL   Creatinine, Ser 0.73 0.61 - 1.24 mg/dL   Calcium 8.5 (L) 8.9 - 10.3 mg/dL   Total Protein 7.1 6.5 - 8.1 g/dL   Albumin 3.0 (L) 3.5 - 5.0 g/dL   AST 40 15 - 41 U/L   ALT 24 0 - 44 U/L   Alkaline Phosphatase 60 38 - 126 U/L   Total Bilirubin 0.6 0.3 - 1.2 mg/dL   GFR, Estimated >60 >60 mL/min    Comment: (NOTE) Calculated using the CKD-EPI Creatinine Equation (2021)    Anion gap 8 5 - 15    Comment: Performed at [facility], [address] [place], [place] [address]  Magnesium     Status: None   Collection Time: 07/15/21  3:42 AM  Result Value Ref Range    Magnesium 2.1 1.7 - 2.4 mg/dL    Comment: Performed at [facility], [address], [place],  [address]  CBC WITH DIFFERENTIAL     Status: Abnormal   Collection Time: 07/15/21  3:42 AM  Result Value Ref Range   WBC 18.0 (H) 4.0 -  10.5 K/uL   RBC 3.33 (L) 4.22 - 5.81 MIL/uL   Hemoglobin 8.1 (L) 13.0 - 17.0 g/dL   HCT 26.8 (L) 39.0 - 52.0 %   MCV 80.5 80.0 - 100.0 fL   MCH 24.3 (L) 26.0 - 34.0 pg   MCHC 30.2 30.0 - 36.0 g/dL   RDW 16.3 (H) 11.5 - 15.5 %   Platelets 637 (H) 150 - 400 K/uL   nRBC 0.0 0.0 - 0.2 %   Neutrophils Relative % 96 %   Neutro Abs 17.3 (H) 1.7 - 7.7 K/uL   Lymphocytes Relative 3 %   Lymphs Abs 0.5 (L) 0.7 - 4.0 K/uL   Monocytes Relative 0 %   Monocytes Absolute 0.1 0.1 - 1.0 K/uL   Eosinophils Relative 0 %   Eosinophils Absolute 0.0 0.0 - 0.5 K/uL   Basophils Relative 0 %   Basophils Absolute 0.0 0.0 - 0.1 K/uL   Immature Granulocytes 1 %   Abs Immature Granulocytes 0.12 (H) 0.00 - 0.07 K/uL    Comment: Performed at [facility], [address], [place], [place] [address]  Body fluid cell count with differential     Status: Abnormal   Collection Time: 07/15/21 11:15 AM  Result Value Ref Range   Color, Fluid STRAW (A) YELLOW   Appearance, Fluid CLEAR (A) CLEAR   Total Nucleated Cell Count, Fluid 370 0 - 1,000 cu mm   Neutrophil Count, Fluid 30 (H) 0 - 25 %   Lymphs, Fluid 25 %   Monocyte-Macrophage-Serous Fluid 45 (L) 50 - 90 %   Eos, Fluid 0 %   Other Cells, Fluid MESOTHELIAL CELLS %    Comment: Performed at [facility], [address], [place], [place] [address]  Glucose, pleural or peritoneal fluid     Status: None   Collection Time: 07/15/21 11:15 AM  Result Value Ref Range   Glucose, Fluid 160 mg/dL    Comment: (NOTE) No normal range established for this test Results should be evaluated in conjunction with serum values    Fluid Type-FGLU Pleural R     Comment: Performed at [facility], [address] [place], [place] [address]  Culture,  body fluid w Gram Stain-bottle     Status: None (Preliminary result)   Collection Time: 07/15/21 11:15 AM   Specimen: Pleura  Result Value Ref Range   Specimen Description PLEURAL    Special Requests      BOTTLES DRAWN AEROBIC AND ANAEROBIC Blood Culture adequate volume   Culture      NO GROWTH < 24 HOURS Performed at [facility], [address] [place], [place] [address]    Report Status PENDING   Gram stain     Status: None   Collection Time: 07/15/21 11:15 AM   Specimen: Pleura  Result Value Ref Range   Specimen Description PLEURAL    Special Requests NONE    Gram Stain      WBC PRESENT, PREDOMINANTLY MONONUCLEAR NO ORGANISMS SEEN CYTOSPIN SMEAR Performed at [facility], [address] [place], [place] [address]    Report Status 07/15/2021 FINAL   Blood gas, arterial     Status: Abnormal   Collection Time: 07/16/21 12:24 AM  Result Value Ref Range   FIO2 45.00 %   pH, Arterial 7.24 (L) 7.35 - 7.45   pCO2 arterial 68 (HH) 32 - 48 mmHg    Comment: CRITICAL RESULT CALLED TO, READ BACK BY AND VERIFIED WITH: SMITH,B@0034  by MATTHEWS,B 2.23.23  pO2, Arterial 129 (H) 83 - 108 mmHg   Bicarbonate 29.1 (H) 20.0 - 28.0 mmol/L   Acid-Base Excess 0.0 0.0 - 2.0 mmol/L   O2 Saturation 100 %   Patient temperature 37.0    Collection site RIGHT RADIAL    Drawn by [id number]    Allens test (pass/fail) PASS PASS    Comment: Performed at [facility], [address], [place], [place] [address]  Blood gas, arterial     Status: Abnormal   Collection Time: 07/16/21  2:26 AM  Result Value Ref Range   FIO2 35.00 %   pH, Arterial 7.33 (L) 7.35 - 7.45   pCO2 arterial 53 (H) 32 - 48 mmHg   pO2, Arterial 72 (L) 83 - 108 mmHg   Bicarbonate 27.9 20.0 - 28.0 mmol/L   Acid-Base Excess 1.5 0.0 - 2.0 mmol/L   O2 Saturation 95.5 %   Patient temperature 37.0    Collection site RIGHT RADIAL    Drawn by [id number]    Allens test (pass/fail) PASS PASS    Comment: Performed at [facility], [address] [place], [place] [address]  CBC     Status: Abnormal   Collection Time: 07/16/21  4:14 AM  Result Value Ref Range   WBC 22.8 (H) 4.0 - 10.5 K/uL   RBC 3.65 (L) 4.22 - 5.81 MIL/uL   Hemoglobin 9.1 (L) 13.0 - 17.0 g/dL   HCT 30.3 (L) 39.0 - 52.0 %   MCV 83.0 80.0 - 100.0 fL   MCH 24.9 (L) 26.0 - 34.0 pg   MCHC 30.0 30.0 - 36.0 g/dL   RDW 16.3 (H) 11.5 - 15.5 %   Platelets 745 (H) 150 - 400 K/uL   nRBC 0.0 0.0 - 0.2 %    Comment: Performed at [facility], [address] [place], [place] [address]  Brain natriuretic peptide     Status: Abnormal   Collection Time: 07/16/21  4:14 AM  Result Value Ref Range   B Natriuretic Peptide 1,599.0 (H) 0.0 - 100.0 pg/mL    Comment: Performed at [facility], [address], [place], [place] [address]  Basic metabolic panel     Status: Abnormal   Collection Time: 07/16/21  4:14 AM  Result Value Ref Range   Sodium 138 135 - 145 mmol/L   Potassium 4.2 3.5 - 5.1 mmol/L   Chloride 101 98 - 111 mmol/L   CO2 26 22 - 32 mmol/L   Glucose, Bld 131 (H) 70 - 99 mg/dL    Comment: Glucose reference range applies only to samples taken after fasting for at least 8 hours.   BUN 27 (H) 8 - 23 mg/dL   Creatinine, Ser 0.95 0.61 - 1.24 mg/dL   Calcium 9.0 8.9 - 10.3 mg/dL   GFR, Estimated >60 >60 mL/min    Comment: (NOTE) Calculated using the CKD-EPI Creatinine Equation (2021)    Anion gap 11 5 - 15    Comment: Performed at [facility], [address], [place], [place] [address]  Blood gas, arterial     Status: Abnormal   Collection Time: 07/16/21  8:35 AM  Result Value Ref Range   FIO2 15.00 %   pH, Arterial 7.42 7.35 - 7.45   pCO2 arterial 49 (H) 32 - 48 mmHg   pO2, Arterial 62 (L) 83 - 108 mmHg   Bicarbonate 31.8 (H) 20.0 - 28.0 mmol/L   Acid-Base Excess 6.1 (H) 0.0 - 2.0 mmol/L   O2 Saturation 93.5 %  Patient temperature 36.8    Collection site LEFT RADIAL    Drawn by [id number]    Allens test (pass/fail) PASS PASS    Comment: Performed at [facility], [address] [place],  [address]    MICRO:  IMAGING: DG Chest 1 View  Result Date: 07/15/2021 CLINICAL DATA:  Post RIGHT thoracentesis, history hypertension, COPD, RIGHT upper lobe nodule EXAM: CHEST  1 VIEW COMPARISON:  07/14/2021 FINDINGS: Enlargement of cardiac silhouette. Mediastinal contours stable with atherosclerotic calcification aorta noted. Again identified RIGHT upper lobe nodular density and RIGHT basilar atelectasis. Decreased RIGHT pleural effusion. No pneumothorax following thoracentesis. IMPRESSION: No pneumothorax following thoracentesis. Persistent RIGHT upper lobe nodule. Electronically Signed   By: Lavonia Dana M.D.   On: 07/15/2021 11:41   DG Chest Port 1 View  Result Date: 07/16/2021 CLINICAL DATA:  Worsening hypoxia. EXAM: PORTABLE CHEST 1 VIEW COMPARISON:  July 15, 2021 FINDINGS: Mild to moderate severity diffusely increased interstitial lung markings are seen. Stable mild to moderate severity areas of airspace disease are seen within the retrocardiac region of the left lung base and periphery of the upper right lung. There is no evidence of a pleural effusion or pneumothorax. The cardiac silhouette is mildly enlarged and unchanged in size. The visualized skeletal structures are unremarkable. IMPRESSION: 1. Stable mild to moderate severity left basilar and mid to upper right lung airspace disease. Correlation with chest CT is recommended to exclude an underlying neoplastic process. 2. Mild to moderate severity interstitial edema. Electronically Signed   By: Virgina Norfolk M.D.   On: 07/16/2021 00:32   DG Chest Portable 1 View  Result Date: 07/14/2021 CLINICAL DATA:  Dyspnea EXAM: PORTABLE CHEST 1 VIEW COMPARISON:  11/27/2020, CT 07/22/2020 FINDINGS: Emphysematous disease and bronchitic changes. The left lung is grossly clear. Small right-sided pleural effusion with possible loculation. Vague right upper lobe opacity measuring 3.2 cm, probably  representing interval growth of previously noted lung nodule. Stable cardiomediastinal silhouette. Aortic atherosclerosis. Airspace disease at the right base IMPRESSION: 1. Emphysema with small probably loculated right pleural effusion and airspace disease at the right base which may be due to atelectasis or pneumonia 2. Ill-defined right upper lobe opacity likely represents enlargement of previously noted pulmonary nodule, recommend follow-up chest CT. Electronically Signed   By: Donavan Foil M.D.   On: 07/14/2021 20:57   US THORACENTESIS ASP PLEURAL SPACE W/IMG GUIDE  Result Date: 07/15/2021 INDICATION: Right pleural effusion COPD exacerbation EXAM: ULTRASOUND GUIDED Right THORACENTESIS MEDICATIONS: 10 cc 15 lidocaine. COMPLICATIONS: None immediate. PROCEDURE: An ultrasound guided thoracentesis was thoroughly discussed with the patient and questions answered. The benefits, risks, alternatives and complications were also discussed. The patient understands and wishes to proceed with the procedure. Written consent was obtained. Ultrasound was performed to localize and mark an adequate pocket of fluid in the right chest. The area was then prepped and draped in the normal sterile fashion. 1% Lidocaine was used for local anesthesia. Under ultrasound guidance a Yueh catheter was introduced. Thoracentesis was performed. The catheter was removed and a dressing applied. FINDINGS: A total of approximately 1.1 liter of clear yellow fluid was removed. Samples were sent to the laboratory as requested by the clinical team. IMPRESSION: Successful ultrasound guided R thoracentesis yielding 1.1 Liters of pleural fluid. Read by Monia Sabal [facility] Electronically Signed   By: Lavonia Dana M.D.   On: 07/15/2021 11:41    HISTORICAL MICRO/IMAGING  Assessment/Plan: 76yo M with hx of COPD, presented with sepsis due to COPD  exacerbation with right pleural effusion in the setting of enterococcal bacteremia.  Currently on ctx and  azithromycin  - recommend to change to unasyn- discontinue ceftriaxone - keep azithromycin until results of urine legionella - repeat blood cx tomorrow to see bacteremia is clearing - recommend to get TTE  Pneumonia = continue on unasyn. And will follow up on cultures  Tachycardia = likely related in setting of sepsis/underlying pneumonia. Continue on telemetry

## 2021-07-16 NOTE — Progress Notes (Signed)
Pharmacy Antibiotic Note  Joel Perez is a 76 y.o. male admitted on 07/14/2021 with pneumonia and bacteremia.  Pharmacy has been consulted for Unasyn dosing.  Plan: Unasyn 3000 mg IV every 6 hours. Monitor labs, c/s, and patient improvement.  Height: 5\' 10"  (177.8 cm) Weight: 94.3 kg (207 lb 14.3 oz) IBW/kg (Calculated) : 73  Temp (24hrs), Avg:98.1 F (36.7 C), Min:97.2 F (36.2 C), Max:98.7 F (37.1 C)  Recent Labs  Lab 07/14/21 1940 07/14/21 2204 07/14/21 2359 07/15/21 0342 07/16/21 0414  WBC 20.2*  --   --  18.0* 22.8*  CREATININE 0.87  --   --  0.73 0.95  LATICACIDVEN  --  2.5* 1.8  --   --     Estimated Creatinine Clearance: 75.1 mL/min (by C-G formula based on SCr of 0.95 mg/dL).    Allergies  Allergen Reactions   Other Swelling and Other (See Comments)    Farmed Fish (tightness in throat & lip swelling)   Codeine Rash   Sulfa Antibiotics Hives    Antimicrobials this admission: Unasyn 2/23 >>  CTX 2/21 >>2/22 Azith 2/21 >>    Microbiology results: 2/21 Bcx: gram + cocci BCID- enterococcus faecalis 2/22 Sputum Cx: pending 2/22 MRSA PCR: neg  Thank you for allowing pharmacy to be a part of this patients care.  Margot Ables, PharmD Clinical Pharmacist 07/16/2021 11:45 AM

## 2021-07-16 NOTE — Progress Notes (Signed)
**Note De-identified  Obfuscation** EKG complete and placed in patient chart 

## 2021-07-17 ENCOUNTER — Inpatient Hospital Stay (HOSPITAL_COMMUNITY): Payer: Medicare Other

## 2021-07-17 DIAGNOSIS — Z7189 Other specified counseling: Secondary | ICD-10-CM

## 2021-07-17 DIAGNOSIS — A419 Sepsis, unspecified organism: Secondary | ICD-10-CM

## 2021-07-17 DIAGNOSIS — Z515 Encounter for palliative care: Secondary | ICD-10-CM

## 2021-07-17 DIAGNOSIS — J90 Pleural effusion, not elsewhere classified: Secondary | ICD-10-CM

## 2021-07-17 DIAGNOSIS — Z66 Do not resuscitate: Secondary | ICD-10-CM

## 2021-07-17 DIAGNOSIS — E441 Mild protein-calorie malnutrition: Secondary | ICD-10-CM

## 2021-07-17 LAB — COMPREHENSIVE METABOLIC PANEL
ALT: 210 U/L — ABNORMAL HIGH (ref 0–44)
AST: 147 U/L — ABNORMAL HIGH (ref 15–41)
Albumin: 3.4 g/dL — ABNORMAL LOW (ref 3.5–5.0)
Alkaline Phosphatase: 150 U/L — ABNORMAL HIGH (ref 38–126)
Anion gap: 10 (ref 5–15)
BUN: 33 mg/dL — ABNORMAL HIGH (ref 8–23)
CO2: 31 mmol/L (ref 22–32)
Calcium: 9.3 mg/dL (ref 8.9–10.3)
Chloride: 100 mmol/L (ref 98–111)
Creatinine, Ser: 0.77 mg/dL (ref 0.61–1.24)
GFR, Estimated: >60 mL/min (ref >60)
Glucose, Bld: 134 mg/dL — ABNORMAL HIGH (ref 70–99)
Potassium: 4.5 mmol/L (ref 3.5–5.1)
Sodium: 141 mmol/L (ref 135–145)
Total Bilirubin: 0.2 mg/dL — ABNORMAL LOW (ref 0.3–1.2)
Total Protein: 7.7 g/dL (ref 6.5–8.1)

## 2021-07-17 LAB — RENAL FUNCTION PANEL
Albumin: 3.4 g/dL — ABNORMAL LOW (ref 3.5–5.0)
Anion gap: 11 (ref 5–15)
BUN: 33 mg/dL — ABNORMAL HIGH (ref 8–23)
CO2: 30 mmol/L (ref 22–32)
Calcium: 9.3 mg/dL (ref 8.9–10.3)
Chloride: 100 mmol/L (ref 98–111)
Creatinine, Ser: 0.77 mg/dL (ref 0.61–1.24)
GFR, Estimated: >60 mL/min (ref >60)
Glucose, Bld: 136 mg/dL — ABNORMAL HIGH (ref 70–99)
Phosphorus: 2.6 mg/dL (ref 2.5–4.6)
Potassium: 4.4 mmol/L (ref 3.5–5.1)
Sodium: 141 mmol/L (ref 135–145)

## 2021-07-17 LAB — CBC WITH DIFFERENTIAL/PLATELET
Abs Immature Granulocytes: 0.17 10*3/uL — ABNORMAL HIGH (ref 0.00–0.07)
Basophils Absolute: 0 10*3/uL (ref 0.0–0.1)
Basophils Relative: 0 %
Eosinophils Absolute: 0 10*3/uL (ref 0.0–0.5)
Eosinophils Relative: 0 %
HCT: 32.4 % — ABNORMAL LOW (ref 39.0–52.0)
Hemoglobin: 9.3 g/dL — ABNORMAL LOW (ref 13.0–17.0)
Immature Granulocytes: 1 %
Lymphocytes Relative: 3 %
Lymphs Abs: 0.7 10*3/uL (ref 0.7–4.0)
MCH: 23.6 pg — ABNORMAL LOW (ref 26.0–34.0)
MCHC: 28.7 g/dL — ABNORMAL LOW (ref 30.0–36.0)
MCV: 82.2 fL (ref 80.0–100.0)
Monocytes Absolute: 0.7 10*3/uL (ref 0.1–1.0)
Monocytes Relative: 3 %
Neutro Abs: 20.9 10*3/uL — ABNORMAL HIGH (ref 1.7–7.7)
Neutrophils Relative %: 93 %
Platelets: 692 10*3/uL — ABNORMAL HIGH (ref 150–400)
RBC: 3.94 MIL/uL — ABNORMAL LOW (ref 4.22–5.81)
RDW: 16.3 % — ABNORMAL HIGH (ref 11.5–15.5)
WBC: 22.5 10*3/uL — ABNORMAL HIGH (ref 4.0–10.5)
nRBC: 0 % (ref 0.0–0.2)

## 2021-07-17 MED ORDER — OXYCODONE HCL 5 MG PO TABS
5.0000 mg | ORAL_TABLET | Freq: Once | ORAL | Status: AC
  Administered 2021-07-17: 5 mg via ORAL
  Filled 2021-07-17: qty 1

## 2021-07-17 NOTE — Consult Note (Signed)
Palliative Care Consult Note                                  Date: 07/17/2021   Patient Name: Joel Perez  DOB: May 17, 1946  MRN: [id number]  Age / Sex: 76 y.o., male  PCP: [name], MD Referring Physician: Roxan Hockey, MD  Reason for Consultation: Establishing goals of care  HPI/Patient Profile: 76 y.o. male  with past medical history of blindness in the right eye, BPH, COPD, current smoker, hypertension, neuropathy admitted on 07/15/2021 with acute on chronic COPD exacerbation secondary to presumed pneumonia with worsening hypoxia and pleural effusion.  PMT consulted for goals of care.  Past Medical History:  Diagnosis Date   Blind right eye 1985   Following a work accident   BPH (benign prostatic hyperplasia)    Capsular cataract of left eye    COPD (chronic obstructive pulmonary disease) ([place])    Dr. [name]   Current smoker    Long-term smoker. Not interested in quitting   Diverticulosis    With intermittent diverticulitis   Erectile dysfunction    Essential hypertension    Several recorded blood pressures greater than 140/90.   H/O mitral valve prolapse    By report, but not confirmed by echo.   Low back pain    Chronic. Followed by Dr. Suella Broad   Mild aortic regurgitation 12/2013   Mild to moderate aortic regurgitation on echo   Neuropathy     Subjective:   This NP Walden Field reviewed medical records, received report from team, assessed the patient and then meet at the patient's bedside to discuss diagnosis, prognosis, GOC, EOL wishes disposition and options.  I met with the patient's wife at the bedside.   Concept of Palliative Care was introduced as specialized medical care for people and their families living with serious illness.  If focuses on providing relief from the symptoms and stress of a serious illness.  The goal is to improve quality of life for both the patient and the family. Values and  goals of care important to patient and family were attempted to be elicited.  Created space and opportunity for patient  and family to explore thoughts and feelings regarding current medical situation   Natural trajectory and current clinical status were discussed. Questions and concerns addressed. Patient  encouraged to call with questions or concerns.    Patient/Family Understanding of Illness: She understands he has had significant decline since last week.  States that he was gasping for air at home and she never wants to see him like this again.  She knows he is not a candidate for the ventilator and he would not want to be.  There is a question of possible nursing home placement pending clinical progression.  She knows his lungs are not good and his quality of life is not great.  He can no longer do what he wants to do.  We had a further long discussion on his current clinical status, the fact that he has end-stage emphysema and despite all aggressive medical interventions he is not seeming to improve much.  We discussed emphysema, end-stage emphysema, disease trajectory and prognosis.  Life Review: He has been married to his wife for 66 years and has 2 daughters and 1 son.  He used to like to do a lot of outdoor activities which she can no longer do.  His quality life  overall is not what he would consider acceptable.  Patient Values: Family, faith.  Goals: His wife's goal for him is to not suffer.  Today's Discussion: Because the patient is very sleepy and somewhat confused (received a pain pill this morning) I elected to not wake him.  His wife states that he is a bit confused today not knowing where he is at.  She is his legal next of kin and would have decision-making abilities given that he is unable to make decisions and understand ramifications of decisions at this point.    In addition to the disease process discussion as described above we had a lengthy discussion about many  topics including family, strong faith tradition, religious believes that are helping her be at peace with this.  She is happy that he ate for the first time today in 3 weeks.  She describes a good support system including family, friends, neighbors, strong church family.  They currently attended new [employer].  She expresses to me that she has a "feeling that he is not going to make it".  She describes they already have a hospital bed at home.  She shared many details about her faith and how she feels it is okay to die because she knows he will go to a better place.  She understands that everybody must die someday and that we will all have "a joyful reunion in heaven" where she is confident that he will go.  This seems to help bring her peace and acceptance of his end-of-life situation.  We discussed a range of options related to treatment from full and aggressive care all the way through comfort focused care.  After discussing what each of these pathways would look like and pinning a realistic picture of what is involved she knows he would not want continued aggressive care.  He is already elected to be DNR/DNI.  She does not think that current aggressive care has improved him much.  After a long discussion she elects to consult with hospice and have a goal of discharge to home with hospice support.  She feels it would be easier for her to be with him more consistently in the home versus in a facility.  I provided emotional and general support through therapeutic listening, empathy, sharing stories, therapeutic touch, and other techniques.  Answered all questions and addressed all concerns to the best of my ability.  Review of Systems  Unable to perform ROS: Other : patient sleepy and somewhat confused  Objective:   Primary Diagnoses: Present on Admission:  Acute on chronic respiratory failure with hypoxia (HCC)  Sepsis ([place])  RLL pneumonia  COPD with acute exacerbation (HCC)  Mild  protein-calorie malnutrition ([place])  GERD (gastroesophageal reflux disease)  HTN (hypertension)   Physical Exam Vitals and nursing note reviewed.  Constitutional:      General: He is sleeping. He is not in acute distress.    Appearance: He is ill-appearing.  HENT:     Head: Normocephalic and atraumatic.  Cardiovascular:     Rate and Rhythm: Tachycardia present.  Pulmonary:     Effort: Pulmonary effort is normal. No respiratory distress.     Breath sounds: No wheezing or rhonchi.  Abdominal:     General: Abdomen is flat.     Palpations: Abdomen is soft.  Skin:    General: Skin is warm and dry.    Vital Signs:  BP (!) 95/36    Pulse (!) 53    Temp  97.9 F (36.6 C) (Axillary)    Resp 16    Ht _0  (1.778 m)    Wt 91.5 kg    SpO2 100%    BMI 28.94 kg/m   Palliative Assessment/Data: 20%    Advanced Care Planning:   Primary Decision Maker: NEXT OF KIN  Code Status/Advance Care Planning: DNR  A discussion was had today regarding advanced directives. Concepts specific to code status, artifical feeding and hydration, continued IV antibiotics and rehospitalization was had.  The difference between a aggressive medical intervention path and a palliative comfort care path for this patient at this time was had.  Decisions/Changes to ACP: Remain DNR Elected home with hospice  Assessment & Plan:   Impression: 76 year old male with end-stage emphysema admitted with exacerbation of COPD, presumed pneumonia.  He is on increased oxygen levels currently weaned down to 10 L/min.  The nurse feels that he will likely be able to come down after breathing treatment as he has been 100% saturations on 10 L.  Overall his wife understands that he is at end-of-life.  She is found peace with this through her strong faith and acceptance that he will go to heaven.  Her overarching goal is she does not want him to suffer.  She states "I never want to see him gasping for air like he was the other  day."  We discussed hospice services and how they can help with comfort, dignity, quality.  She has elected hospice services which I think is appropriate because overall his prognosis is quite poor.  SUMMARY OF RECOMMENDATIONS   Remain DNR TOC consult for home hospice services Continue to treat the treatable with goal of stability for safe transfer to home with hospice Continued emotional and spiritual support of patient/family  Symptom Management:  Per primary team PMT is available to assist as needed  Prognosis:  < 4 weeks  Discharge Planning:  Home with Hospice   Discussed with: Patient's wife, medical team, nursing team, [facility] team    Thank you for allowing [place] to participate in the care of [name] PMT will continue to support holistically.  Time Total: 120 min  Greater than 50%  of this time was spent counseling and coordinating care related to the above assessment and plan.  Signed by: Walden Field, NP Palliative Medicine Team  Team Phone # [phone] (Nights/Weekends)  07/17/2021, 2:18 PM

## 2021-07-17 NOTE — TOC Progression Note (Signed)
Transition of Care [facility]) - Progression Note    Patient Details  Name: Joel Perez MRN: [id number] Date of Birth: 1946-03-16  Transition of Care [facility]) CM/SW Contact  Shade Flood, LCSW Phone Number: 07/17/2021, 3:22 PM  Clinical Narrative:     Received consult for hospice at home referral. Met with pt's wife at pt bedside today to review dc planning. Pt's wife confirms that she would like to take pt home with hospice. CMS provider options reviewed and referral made to [facility] at her request. Pt currently has a hospital bed, 5L O2 concentrator, tub bench, BSC, and walker for DME. Pt currently on 9L o2. Updated [facility] rep of above. They will reach out to pt's wife and assist with arranging hospice at home.  Weekend TOC will follow.  Expected Discharge Plan: Home w Hospice Care Barriers to Discharge: Continued Medical Work up  Expected Discharge Plan and Services Expected Discharge Plan: [place] In-house Referral: Clinical Social Work   Post Acute Care Choice: Hospice Living arrangements for the past 2 months: [place]: RN, PT [facility] Agency: [place] Date [place]: 07/15/21 Time [place]: [phone] Representative spoke with at [place]: [place] ([place]) Interventions    Readmission Risk Interventions Readmission Risk Prevention Plan 07/15/2021  Transportation Screening Complete  Home Care Screening Complete  Medication Review (RN CM) Complete  Some recent data might be hidden

## 2021-07-17 NOTE — Progress Notes (Signed)
PROGRESS NOTE     Joel Perez, is a 76 y.o. male, DOB - [birth year], FUX:[id number]  Admit date - 07/14/2021   Admitting Physician [name], DO  Outpatient Primary MD for the patient is [name], MD  LOS - 3  Chief Complaint  Patient presents with   Shortness of Breath       Brief Summary:- a 76 y.o. male with history of blindness in the right eye, BPH, COPD, current smoker, hypertension, neuropathy admitted on 07/15/2021 with acute on chronic COPD exacerbation secondary to presumed pneumonia with worsening hypoxia and pleural effusion    -Assessment and Plan:  A/p 1)Acute on chronic hypoxic and hypercapnic respiratory failure--at baseline patient uses 4 L of oxygen at home recently was increased to 5 L of oxygen PTA -Oxygen requirement remains high  -Patient really does not like BiPAP, -Currently on 10 L of oxygen via nasal cannula -ABG initially showed uncompensated respiratory acidosis repeat ABG after BiPAP use shows compensation -Patient and wife admits to ongoing tobacco use at home and poor compliance with oxygen due to the need to smoke -Continue bronchodilators, Rocephin/azithromycin and supplemental oxygen -Pulmonology consult from Dr. [name] appreciated - Respiratory status still very tenuous -Patient is at risk for further decompensation -Patient and wife requesting possible discharge home with hospice over the next day or 2 if he stabilizes   2) Enterococcus sepsis secondary to right-sided pneumonia--management as above #1 -Primary blood cultures with anaerobic bottle growing GPC- -Strep pneumo antigen negative Legionella negative -COVID and influenza negative -Await final culture data -Persistent leukocytosis partly due to steroids -Discussed with ID physician Dr. [name], recommends IV Unasyn for Enterococcus bacteremia, will discharge home on p.o. Augmentin 12 days -No need for echo and repeat blood cultures as patient is transitioning to  hospice -Okay to discontinue azithromycin as Legionella is negative  3) COPD exacerbation/tobacco abuse----patient with end-stage emphysema management as above #1 -Continue IV Solu-Medrol -Patient is not ready to quit smoking   4) right-sided pleural effusion in the setting of right-sided pneumonia and underlying COPD -Status post ultrasound-guided thoracentesis on 07/15/2021 with 1.1 L removed -Fluid studies requested   5) acute on chronic anemia--Hgb is stable around 9, from a baseline around 10 continue to monitor closely  6) social/ethics--- -Overall prognosis is not good given advanced/end-stage emphysema and bacteremia --After conversation with pulmonologist patient and family request DNR/DNI status -Official palliative care consult appreciated-----Patient and wife requesting possible discharge home with hospice over the next day or 2 if he stabilizes  7)FEN--- speech pathologist eval appreciated, recommended Dysphagia 2 (fine chop);Thin liquid  8) acute on chronic preserved EF/diastolic dysfunction CHF--- echo from July 2022 with EF 60-65 % -BNP up to 1599- -Gentle diuresis with IV Lasix Daily weight and fluid output and input monitoring requested  Disposition/Need for in-Hospital Stay- patient unable to be discharged at this time due to --acute on chronic hypoxic and hypercapnic respiratory failure requiring BiPAP and high flow oxygen, Enterococcus bacteremia requiring IV antibiotics -- Respiratory status still very tenuous -Patient is at risk for further decompensation --Patient and wife requesting possible discharge home with hospice over the next day or 2 if he stabilizes  Status is: Inpatient   Disposition: The patient is from: Home              Anticipated d/c is to: Home with Hospice              Anticipated d/c date is: 2 days  Patient currently is not medically stable to d/c. Barriers: Not Clinically Stable-   Code Status :  -  Code Status: DNR    Family Communication:   (patient is alert, awake and coherent)  Wife at bedside  DVT Prophylaxis  :   - SCDs  heparin injection 5,000 Units Start: 07/15/21 0600 SCDs Start: 07/15/21 0127   Lab Results  Component Value Date   PLT 692 (H) 07/17/2021    Inpatient Medications  Scheduled Meds:  aspirin EC  81 mg Oral Daily   atorvastatin  40 mg Oral Daily   chlorhexidine  15 mL Mouth Rinse BID   Chlorhexidine Gluconate Cloth  6 each Topical Daily   feeding supplement  237 mL Oral TID BM   furosemide  20 mg Intravenous Q12H   gabapentin  600 mg Oral TID   heparin  5,000 Units Subcutaneous Q8H   ipratropium  0.5 mg Nebulization Q6H   levalbuterol  0.63 mg Nebulization Q6H   lidocaine  1 patch Transdermal Q24H   mouth rinse  15 mL Mouth Rinse q12n4p   melatonin  9 mg Oral QHS   methylPREDNISolone (SOLU-MEDROL) injection  40 mg Intravenous Q12H   mometasone-formoterol  2 puff Inhalation BID   multivitamin with minerals  1 tablet Oral Daily   pantoprazole  40 mg Oral Daily   vitamin B-12  1,000 mcg Oral Daily   Continuous Infusions:  ampicillin-sulbactam (UNASYN) IV 3 g (07/17/21 1709)   azithromycin Stopped (07/16/21 2337)   PRN Meds:.acetaminophen **OR** acetaminophen, albuterol, ALPRAZolam, ondansetron **OR** ondansetron (ZOFRAN) IV, oxyCODONE, polyethylene glycol   Anti-infectives (From admission, onward)    Start     Dose/Rate Route Frequency Ordered Stop   07/16/21 1230  Ampicillin-Sulbactam (UNASYN) 3 g in sodium chloride 0.9 % 100 mL IVPB        3 g 200 mL/hr over 30 Minutes Intravenous Every 6 hours 07/16/21 1143     07/16/21 1145  Ampicillin-Sulbactam (UNASYN) 3 g in sodium chloride 0.9 % 100 mL IVPB  Status:  Discontinued        3 g 200 mL/hr over 30 Minutes Intravenous Every 8 hours 07/16/21 1056 07/16/21 1143   07/15/21 2000  cefTRIAXone (ROCEPHIN) 1 g in sodium chloride 0.9 % 100 mL IVPB  Status:  Discontinued        1 g 200 mL/hr over 30 Minutes  Intravenous Every 24 hours 07/15/21 0126 07/16/21 1053   07/14/21 2200  cefTRIAXone (ROCEPHIN) 1 g in sodium chloride 0.9 % 100 mL IVPB        1 g 200 mL/hr over 30 Minutes Intravenous  Once 07/14/21 2145 07/14/21 2235   07/14/21 2200  azithromycin (ZITHROMAX) 500 mg in sodium chloride 0.9 % 250 mL IVPB        500 mg 250 mL/hr over 60 Minutes Intravenous Every 24 hours 07/14/21 2145 07/18/21 2159         Subjective: Joel Perez today has no fevers, no emesis,  No chest pain,   - -Breathing difficulties persist currently on 10 L of oxygen via nasal cannula -Oral intake is fair not great --Patient and wife requesting possible discharge home with hospice over the next day or 2 if he stabilizes   Objective: Vitals:   07/17/21 1700 07/17/21 1800 07/17/21 1900 07/17/21 1927  BP: (!) 109/58 125/67 (!) 118/56   Pulse: (!) 107 (!) 101 99 60  Resp: 18 16 20  (!) 25  Temp:      TempSrc:  SpO2: 100% 100% 100% 100%  Weight:      Height:        Intake/Output Summary (Last 24 hours) at 07/17/2021 1946 Last data filed at 07/17/2021 1927 Gross per 24 hour  Intake 929.9 ml  Output 1550 ml  Net -620.1 ml   Filed Weights   07/15/21 0030 07/16/21 0438 07/17/21 0500  Weight: 97.8 kg 94.3 kg 91.5 kg    Physical Exam  Gen:- Awake Alert, patient is a "pink puffer" HEENT:- [place].AT, No sclera icterus Nose- 10L/min Neck-Supple Neck,No JVD,.  Lungs-diminished breath sounds, scattered rhonchi bilaterally  CV- S1, S2 normal, regular  Abd-  +ve B.Sounds, Abd Soft, No tenderness,    Extremity/Skin:- No  edema, pedal pulses present  Psych-affect is appropriate, oriented x3 Neuro-generalized weakness no new focal deficits, no tremors MSK-right BKA  Data Reviewed: I have personally reviewed following labs and imaging studies  CBC: Recent Labs  Lab 07/14/21 1940 07/15/21 0342 07/16/21 0414 07/17/21 0415  WBC 20.2* 18.0* 22.8* 22.5*  NEUTROABS 17.3* 17.3*  --  20.9*  HGB 9.2* 8.1* 9.1*  9.3*  HCT 29.9* 26.8* 30.3* 32.4*  MCV 81.9 80.5 83.0 82.2  PLT 755* 637* 745* 657*   Basic Metabolic Panel: Recent Labs  Lab 07/14/21 1940 07/15/21 0342 07/16/21 0414 07/17/21 0415  NA 135 135 138 141   141  K 3.6 4.5 4.2 4.4   4.5  CL 98 101 101 100   100  CO2 28 26 26 30   31   GLUCOSE 169* 165* 131* 136*   134*  BUN 19 18 27* 33*   33*  CREATININE 0.87 0.73 0.95 0.77   0.77  CALCIUM 8.8* 8.5* 9.0 9.3   9.3  MG  --  2.1  --   --   PHOS  --   --   --  2.6   GFR: Estimated Creatinine Clearance: 87.9 mL/min (by C-G formula based on SCr of 0.77 mg/dL). Liver Function Tests: Recent Labs  Lab 07/14/21 1940 07/15/21 0342 07/17/21 0415  AST 36 40 147*  ALT 24 24 210*  ALKPHOS 76 60 150*  BILITOT 0.7 0.6 0.2*  PROT 7.6 7.1 7.7  ALBUMIN 3.3* 3.0* 3.4*   3.4*   Cardiac Enzymes: No results for input(s): CKTOTAL, CKMB, CKMBINDEX, TROPONINI in the last 168 hours. BNP (last 3 results) No results for input(s): PROBNP in the last 8760 hours. HbA1C: No results for input(s): HGBA1C in the last 72 hours. Sepsis Labs: @LABRCNTIP (procalcitonin:4,lacticidven:4) ) Recent Results (from the past 240 hour(s))  Resp Panel by RT-PCR (Flu A&B, Covid) Nasopharyngeal Swab     Status: None   Collection Time: 07/14/21  8:47 PM   Specimen: Nasopharyngeal Swab; Nasopharyngeal(NP) swabs in vial transport medium  Result Value Ref Range Status   SARS Coronavirus 2 by RT PCR NEGATIVE NEGATIVE Final    Comment: (NOTE) SARS-CoV-2 target nucleic acids are NOT DETECTED.  The SARS-CoV-2 RNA is generally detectable in upper respiratory specimens during the acute phase of infection. The lowest concentration of SARS-CoV-2 viral copies this assay can detect is 138 copies/mL. A negative result does not preclude SARS-Cov-2 infection and should not be used as the sole basis for treatment or other patient management decisions. A negative result may occur with  improper specimen collection/handling,  submission of specimen other than nasopharyngeal swab, presence of viral mutation(s) within the areas targeted by this assay, and inadequate number of viral copies(<138 copies/mL). A negative result must be combined with clinical observations,  patient history, and epidemiological information. The expected result is Negative.  Fact Sheet for Patients:  EntrepreneurPulse.com.au  Fact Sheet for Healthcare Providers:  IncredibleEmployment.be  This test is no t yet approved or cleared by the [place] FDA and  has been authorized for detection and/or diagnosis of SARS-CoV-2 by FDA under an Emergency Use Authorization (EUA). This EUA will remain  in effect (meaning this test can be used) for the duration of the COVID-19 declaration under Section 564(b)(1) of the Act, 21 U.S.C.section 360bbb-3(b)(1), unless the authorization is terminated  or revoked sooner.       Influenza A by PCR NEGATIVE NEGATIVE Final   Influenza B by PCR NEGATIVE NEGATIVE Final    Comment: (NOTE) The Xpert Xpress SARS-CoV-2/FLU/RSV plus assay is intended as an aid in the diagnosis of influenza from Nasopharyngeal swab specimens and should not be used as a sole basis for treatment. Nasal washings and aspirates are unacceptable for Xpert Xpress SARS-CoV-2/FLU/RSV testing.  Fact Sheet for Patients: EntrepreneurPulse.com.au  Fact Sheet for Healthcare Providers: IncredibleEmployment.be  This test is not yet approved or cleared by the [place] FDA and has been authorized for detection and/or diagnosis of SARS-CoV-2 by FDA under an Emergency Use Authorization (EUA). This EUA will remain in effect (meaning this test can be used) for the duration of the COVID-19 declaration under Section 564(b)(1) of the Act, 21 U.S.C. section 360bbb-3(b)(1), unless the authorization is terminated or revoked.  Performed at [facility], [address] [place], [place] [address]   Culture, blood (routine x 2)     Status: Abnormal (Preliminary result)   Collection Time: 07/14/21 10:04 PM   Specimen: BLOOD  Result Value Ref Range Status   Specimen Description   Final    BLOOD RIGHT ANTECUBITAL Performed at [facility], [address], [place], [place] [address]    Special Requests   Final    BOTTLES DRAWN AEROBIC AND ANAEROBIC Blood Culture adequate volume Performed at [facility], [address] [place], [place] [address]    Culture  Setup Time   Final    ANAEROBIC BOTTLE ONLY aerobic bottle GRAM POSITIVE COCCI Gram Stain Report Called to,Read Back By and Verified With: KATIE LOOMIS @ 1017 ON 07/15/21 C VARNER RN DUSTIN EVERETT 07/16/21@00 :39 BY TW IN BOTH AEROBIC AND ANAEROBIC BOTTLES    Culture (A)  Final    ENTEROCOCCUS FAECALIS SUSCEPTIBILITIES TO FOLLOW Performed at [facility], [address], [place], [place] [address]    Report Status PENDING  Incomplete  Culture, blood (routine x 2)     Status: None (Preliminary result)   Collection Time: 07/14/21 10:04 PM   Specimen: BLOOD  Result Value Ref Range Status   Specimen Description BLOOD LEFT ANTECUBITAL  Final   Special Requests   Final    BOTTLES DRAWN AEROBIC AND ANAEROBIC Blood Culture adequate volume   Culture   Final    NO GROWTH 3 DAYS Performed at [facility], [address], [place],  [address]    Report Status PENDING  Incomplete  Blood Culture ID Panel (Reflexed)     Status: Abnormal   Collection Time: 07/14/21 10:04 PM  Result Value Ref Range Status   Enterococcus faecalis DETECTED (A) NOT DETECTED Final    Comment: CRITICAL RESULT CALLED TO, READ BACK BY AND VERIFIED WITH: RN DUSTIN EVERETT 07/16/21@00 :39 BY TW    Enterococcus Faecium NOT DETECTED NOT DETECTED Final   Listeria monocytogenes NOT DETECTED NOT DETECTED Final  Staphylococcus species NOT DETECTED NOT DETECTED Final   Staphylococcus aureus (BCID) NOT DETECTED NOT DETECTED Final    Staphylococcus epidermidis NOT DETECTED NOT DETECTED Final   Staphylococcus lugdunensis NOT DETECTED NOT DETECTED Final   Streptococcus species NOT DETECTED NOT DETECTED Final   Streptococcus agalactiae NOT DETECTED NOT DETECTED Final   Streptococcus pneumoniae NOT DETECTED NOT DETECTED Final   Streptococcus pyogenes NOT DETECTED NOT DETECTED Final   A.calcoaceticus-baumannii NOT DETECTED NOT DETECTED Final   Bacteroides fragilis NOT DETECTED NOT DETECTED Final   Enterobacterales NOT DETECTED NOT DETECTED Final   Enterobacter cloacae complex NOT DETECTED NOT DETECTED Final   Escherichia coli NOT DETECTED NOT DETECTED Final   Klebsiella aerogenes NOT DETECTED NOT DETECTED Final   Klebsiella oxytoca NOT DETECTED NOT DETECTED Final   Klebsiella pneumoniae NOT DETECTED NOT DETECTED Final   Proteus species NOT DETECTED NOT DETECTED Final   Salmonella species NOT DETECTED NOT DETECTED Final   Serratia marcescens NOT DETECTED NOT DETECTED Final   Haemophilus influenzae NOT DETECTED NOT DETECTED Final   Neisseria meningitidis NOT DETECTED NOT DETECTED Final   Pseudomonas aeruginosa NOT DETECTED NOT DETECTED Final   Stenotrophomonas maltophilia NOT DETECTED NOT DETECTED Final   Candida albicans NOT DETECTED NOT DETECTED Final   Candida auris NOT DETECTED NOT DETECTED Final   Candida glabrata NOT DETECTED NOT DETECTED Final   Candida krusei NOT DETECTED NOT DETECTED Final   Candida parapsilosis NOT DETECTED NOT DETECTED Final   Candida tropicalis NOT DETECTED NOT DETECTED Final   Cryptococcus neoformans/gattii NOT DETECTED NOT DETECTED Final   Vancomycin resistance NOT DETECTED NOT DETECTED Final    Comment: Performed at [facility], [address] [place], [place] [address]  MRSA Next Gen by PCR, Nasal     Status: None   Collection Time: 07/15/21 12:56 AM   Specimen: Nasal Mucosa; Nasal Swab  Result Value Ref Range Status   MRSA by PCR Next Gen NOT DETECTED NOT DETECTED Final     Comment: (NOTE) The GeneXpert MRSA Assay (FDA approved for NASAL specimens only), is one component of a comprehensive MRSA colonization surveillance program. It is not intended to diagnose MRSA infection nor to guide or monitor treatment for MRSA infections. Test performance is not FDA approved in patients less than 13 years old. Performed at [facility], [address] [place], [place] [address]   Acid Fast Smear (AFB)     Status: None   Collection Time: 07/15/21 11:15 AM   Specimen: Pleural, Right; Body Fluid  Result Value Ref Range Status   AFB Specimen Processing Concentration  Final   Acid Fast Smear Negative  Final    Comment: (NOTE) Performed At: [facility] [place], [place] [id number] Rush Farmer MD TT:[id number]    Source (AFB) PLEURAL  Final    Comment: Performed at [facility], [address] [place], [place] [address]  Culture, body fluid w Gram Stain-bottle     Status: None (Preliminary result)   Collection Time: 07/15/21 11:15 AM   Specimen: Pleura  Result Value Ref Range Status   Specimen Description PLEURAL  Final   Special Requests   Final    BOTTLES DRAWN AEROBIC AND ANAEROBIC Blood Culture adequate volume   Culture   Final    NO GROWTH 2 DAYS Performed at [facility], [address] [place],  [address]    Report Status PENDING  Incomplete  Gram stain     Status: None   Collection  Time: 07/15/21 11:15 AM   Specimen: Pleura  Result Value Ref Range Status   Specimen Description PLEURAL  Final   Special Requests NONE  Final   Gram Stain   Final    WBC PRESENT, PREDOMINANTLY MONONUCLEAR NO ORGANISMS SEEN CYTOSPIN SMEAR Performed at [facility], [address], [place], [place] [address]    Report Status 07/15/2021 FINAL  Final      Radiology Studies: DG CHEST PORT 1 VIEW  Result Date: 07/17/2021 CLINICAL DATA:  Shortness of breath. EXAM: PORTABLE CHEST 1 VIEW COMPARISON:  07/16/2021 at 12:17 a.m. FINDINGS:  07/17/2021 at 5:08 a.m. A nearly 5 cm opacity is again noted laterally in right upper lobe in the location of a smaller prior spiculated nodule on the chest CT of 07/22/2020. This could be a superimposed infiltrate or interval enlarged mass. The heart is enlarged. Central vascular prominence is again seen, mild interstitial edema in the bases showing improvement, with interstitial edema previously in the upper lung fields having nearly resolved. There are small pleural effusions. Retrocardiac left lower lobe opacity could be due to atelectasis or consolidation. The remaining lungs clear. There is a stable mediastinum with aortic atherosclerosis. Osteopenia. IMPRESSION: 1. Improved interstitial edema, but with interval development of small pleural effusions. 2. Increased opacity left lower lobe could be atelectasis or consolidation. 3. Nearly 5 cm opacity in the prior CT location of a 1.7 cm spiculated right upper lobe nodule. This could be an enlarging neoplasm or superimposed infiltrate. Chest CT is recommended preferably with contrast. Electronically Signed   By: Telford Nab M.D.   On: 07/17/2021 07:16   DG Chest Port 1 View  Result Date: 07/16/2021 CLINICAL DATA:  Worsening hypoxia. EXAM: PORTABLE CHEST 1 VIEW COMPARISON:  July 15, 2021 FINDINGS: Mild to moderate severity diffusely increased interstitial lung markings are seen. Stable mild to moderate severity areas of airspace disease are seen within the retrocardiac region of the left lung base and periphery of the upper right lung. There is no evidence of a pleural effusion or pneumothorax. The cardiac silhouette is mildly enlarged and unchanged in size. The visualized skeletal structures are unremarkable. IMPRESSION: 1. Stable mild to moderate severity left basilar and mid to upper right lung airspace disease. Correlation with chest CT is recommended to exclude an underlying neoplastic process. 2. Mild to moderate severity interstitial edema.  Electronically Signed   By: Virgina Norfolk M.D.   On: 07/16/2021 00:32     Scheduled Meds:  aspirin EC  81 mg Oral Daily   atorvastatin  40 mg Oral Daily   chlorhexidine  15 mL Mouth Rinse BID   Chlorhexidine Gluconate Cloth  6 each Topical Daily   feeding supplement  237 mL Oral TID BM   furosemide  20 mg Intravenous Q12H   gabapentin  600 mg Oral TID   heparin  5,000 Units Subcutaneous Q8H   ipratropium  0.5 mg Nebulization Q6H   levalbuterol  0.63 mg Nebulization Q6H   lidocaine  1 patch Transdermal Q24H   mouth rinse  15 mL Mouth Rinse q12n4p   melatonin  9 mg Oral QHS   methylPREDNISolone (SOLU-MEDROL) injection  40 mg Intravenous Q12H   mometasone-formoterol  2 puff Inhalation BID   multivitamin with minerals  1 tablet Oral Daily   pantoprazole  40 mg Oral Daily   vitamin B-12  1,000 mcg Oral Daily   Continuous Infusions:  ampicillin-sulbactam (UNASYN) IV 3 g (07/17/21 1709)   azithromycin Stopped (07/16/21 2337)  LOS: 3 days   Roxan Hockey M.D on 07/17/2021 at 7:46 PM  Go to [email or web address] - for contact info  [facility] - Office  [phone]  If 7PM-7AM, please contact night-coverage [email or web address] Password [facility] 07/17/2021, 7:46 PM

## 2021-07-17 NOTE — Care Management Important Message (Signed)
Important Message  Patient Details  Name: Joel Perez MRN: [id number] Date of Birth: 06/06/1945   Medicare Important Message Given:  Yes     [name] 07/17/2021, 4:22 PM

## 2021-07-17 NOTE — Progress Notes (Signed)
Speech Language Pathology Treatment: Dysphagia  Patient Details Name: Joel Perez MRN: [id number] DOB: 1945-11-16 Today's Date: 07/17/2021 Time: 1779-3903 SLP Time Calculation (min) (ACUTE ONLY): 25 min  Assessment / Plan / Recommendation Clinical Impression  SLP provided ongoing diagnostic dysphagia therapy while Pt was sitting upright in bed with wife present at bedside. Pt was easily roused and reported that he was "not hungry" but was agreeable to limited trials. Pt consumed half of a graham cracker with slightly prolonged mastication and AP transit. Pt consumed limited sips of water via cup and tsp. Note one overt coughing episode when Pt was attempting to talk during thin trials. SLP advised Pt and wife to limit distraction during PO and further echo recommendation to avoid straws at this time as Pt continues to be at risk for aspiration secondary to compromised respiratory and overall deconditioned status. Nursing, Pt and wife continue to report poor appetite and that he prefers to just drink ensure. Wife reports this is not a change and intake was very poor ~ 3-4weeks prior to admission. Recommend continue with D2/fine chop diet and thin liquids. Meds to be administered one at a time (reportedly this is also how Pt takes them at home). ST will continue to follow acutely. Thank you,   HPI HPI: Joel Perez is a 76 y.o. male with history of blindness in the right eye, BPH, COPD, current smoker, hypertension, neuropathy, and more presents ED with a chief complaint of dyspnea.  Patient reports that he had gradual onset of dyspnea over the last 3 weeks.  He became acutely worse about 3 or 4 hours prior to arrival.  Is associated with exertion.  Even minimal exertion like half rolling over in bed to grab something off of the bedside table causes him to be more short of breath.  He has an associated cough that is productive of light yellow sputum.  He reports that he was temporarily better when he was  on prednisone which was about a week ago.  Patient reports that he has been out of his Judithann Sauger for a few days.  He has been using his ProAir inhaler about 3 times a day.  He also uses 4 L nasal cannula at baseline.  Patient admits to palpitations but denies fever and chest pain.  Patient reports a decreased appetite over the last 3 weeks.  He reports he is barely taking any solid p.o. intake, but is drinking protein shakes.  He drinks some water as well per his report.  He denies any nausea, vomiting, diarrhea, abdominal pain, dysuria.  Patient has no other complaints at this time. Chest x-ray showed a loculated right pleural effusion, right base pneumonia. Patient did choke on oxycodone reports he takes all of his pills with bread at home.      SLP Plan  Continue with current plan of care      Recommendations for follow up therapy are one component of a multi-disciplinary discharge planning process, led by the attending physician.  Recommendations may be updated based on patient status, additional functional criteria and insurance authorization.    Recommendations  Diet recommendations: Dysphagia 2 (fine chop);Thin liquid Liquids provided via: Cup;No straw Medication Administration: Whole meds with liquid Supervision: Patient able to self feed;Full supervision/cueing for compensatory strategies Compensations: Slow rate;Small sips/bites;Multiple dry swallows after each bite/sip Postural Changes and/or Swallow Maneuvers: Seated upright 90 degrees;Upright 30-60 min after meal  Oral Care Recommendations: Oral care BID;Staff/trained caregiver to provide oral care Assistance recommended at discharge: Frequent or constant Supervision/Assistance SLP Visit Diagnosis: Dysphagia, unspecified (R13.10) Plan: Continue with current plan of care          Makayia Duplessis H. Roddie Mc, CCC-SLP Speech Language Pathologist  Wende Bushy  07/17/2021, 10:34 AM

## 2021-07-17 NOTE — Plan of Care (Signed)

## 2021-07-18 LAB — CBC
HCT: 30 % — ABNORMAL LOW (ref 39.0–52.0)
Hemoglobin: 8.7 g/dL — ABNORMAL LOW (ref 13.0–17.0)
MCH: 23.6 pg — ABNORMAL LOW (ref 26.0–34.0)
MCHC: 29 g/dL — ABNORMAL LOW (ref 30.0–36.0)
MCV: 81.5 fL (ref 80.0–100.0)
Platelets: 667 10*3/uL — ABNORMAL HIGH (ref 150–400)
RBC: 3.68 MIL/uL — ABNORMAL LOW (ref 4.22–5.81)
RDW: 16.4 % — ABNORMAL HIGH (ref 11.5–15.5)
WBC: 25 10*3/uL — ABNORMAL HIGH (ref 4.0–10.5)
nRBC: 0.1 % (ref 0.0–0.2)

## 2021-07-18 LAB — CULTURE, BLOOD (ROUTINE X 2): Special Requests: ADEQUATE

## 2021-07-18 NOTE — Progress Notes (Signed)
Pharmacy Antibiotic Note  Joel Perez is a 76 y.o. male admitted on 07/14/2021 with pneumonia and bacteremia.  Pharmacy has been consulted for Unasyn dosing.  Plan: Unasyn 3000 mg IV every 6 hours. Monitor labs, c/s, and patient improvement.  Height: 5\' 10"  (177.8 cm) Weight: 90.5 kg (199 lb 8.3 oz) IBW/kg (Calculated) : 73  Temp (24hrs), Avg:97.9 F (36.6 C), Min:97.8 F (36.6 C), Max:98 F (36.7 C)  Recent Labs  Lab 07/14/21 1940 07/14/21 2204 07/14/21 2359 07/15/21 0342 07/16/21 0414 07/17/21 0415 07/18/21 0500  WBC 20.2*  --   --  18.0* 22.8* 22.5* 25.0*  CREATININE 0.87  --   --  0.73 0.95 0.77   0.77  --   LATICACIDVEN  --  2.5* 1.8  --   --   --   --      Estimated Creatinine Clearance: 87.5 mL/min (by C-G formula based on SCr of 0.77 mg/dL).    Allergies  Allergen Reactions   Other Swelling and Other (See Comments)    Farmed Fish (tightness in throat & lip swelling)   Codeine Rash   Sulfa Antibiotics Hives    Antimicrobials this admission: Unasyn 2/23 >>  CTX 2/21 >>2/22 Azith 2/21 >>  Microbiology results: 2/21 Bcx: gram + cocci BCID- enterococcus faecalis 2/22 Sputum Cx: pending 2/22 MRSA PCR: neg  Thank you for allowing pharmacy to be a part of this patients care.  Hart Robinsons, PharmD Clinical Pharmacist 07/18/2021 8:47 AM

## 2021-07-18 NOTE — Plan of Care (Signed)

## 2021-07-18 NOTE — Progress Notes (Addendum)
PROGRESS NOTE     Joel Perez, is a 76 y.o. male, DOB - 05/17/1946, VOZ:[id number]  Admit date - 07/14/2021   Admitting Physician [name], DO  Outpatient Primary MD for the patient is [name], MD  LOS - 4  Chief Complaint  Patient presents with   Shortness of Breath       Brief Summary:- a 76 y.o. male with history of blindness in the right eye, BPH, COPD, current smoker, hypertension, neuropathy admitted on 07/15/2021 with acute on chronic COPD exacerbation secondary to presumed pneumonia with worsening hypoxia and pleural effusion    -Assessment and Plan:  A/p 1)Acute on chronic hypoxic and hypercapnic respiratory failure--at baseline patient uses 4 L of oxygen at home recently was increased to 5 L of oxygen PTA -Oxygen requirement remains high  -Patient really does not like BiPAP, -Currently on 10 L of oxygen via nasal cannula -ABG initially showed uncompensated respiratory acidosis repeat ABG after BiPAP use shows compensation -Patient and wife admits to ongoing tobacco use at home and poor compliance with oxygen due to the need to smoke -Continue bronchodilators, Rocephin/azithromycin and supplemental oxygen -Pulmonology consult from Dr. [name] appreciated Cxr from 07/17/21 with -Improved interstitial edema, but with interval development of small pleural effusions. 2. Increased opacity left lower lobe could be atelectasis or consolidation. 3. Nearly 5 cm opacity in the prior CT location of a 1.7 cm spiculated right upper lobe nodule. This could be an enlarging neoplasm or superimposed infiltrate. Chest CT is recommended preferably with contrast--- patient and wife would like to defer further work-up for possible without malignancy, they would like to go home with hospice -anticipated discharge home with hospice on 07/19/2021 after a new oxygen concentrator has been delivered to patient's house- -. Patient currently has oxygen concentrator that delivers 5L  O2. Patient will need oxygen concentrator that delivers 10L O2 delivered to the home prior to discharge   2) Enterococcus sepsis secondary to right-sided pneumonia--management as above #1 -Primary blood cultures with anaerobic bottle growing GPC- -Strep pneumo antigen negative Legionella negative -COVID and influenza negative -Await final culture data -Persistent leukocytosis partly due to steroids -Discussed with ID physician Dr. [name], recommends IV Unasyn for Enterococcus bacteremia, will discharge home on p.o. Augmentin 11 days -No need for echo and repeat blood cultures as patient is transitioning to hospice -Okay to discontinue azithromycin as Legionella is negative  3) COPD exacerbation/tobacco abuse----patient with end-stage emphysema management as above #1 -Continue IV Solu-Medrol, consider p.o. prednisone upon discharge -Patient is not ready to quit smoking   4) right-sided pleural effusion in the setting of right-sided pneumonia and underlying COPD -Status post ultrasound-guided thoracentesis on 07/15/2021 with 1.1 L removed -Fluid studies noted   5) acute on chronic anemia--Hgb is stable around 9, from a baseline around 10 continue to monitor closely  6) social/ethics--- -Overall prognosis is not good given advanced/end-stage emphysema and bacteremia --After conversation with pulmonologist patient and family request DNR/DNI status -Official palliative care consult appreciated---- -Hospice consult appreciated --anticipated discharge home with hospice on 07/19/2021 after a new oxygen concentrator has been delivered to patient's house- -. Patient currently has oxygen concentrator that delivers 5L O2. Patient will need oxygen concentrator that delivers 10L O2 delivered to the home prior to discharge  7)FEN--- speech pathologist eval appreciated, recommended Dysphagia 2 (fine chop);Thin liquid  8) acute on chronic preserved EF/diastolic dysfunction CHF--- echo from July 2022  with EF 60-65 % -BNP up to 1599- -Gentle diuresis with IV Lasix  Daily weight and fluid output and input monitoring requested  Disposition/Need for in-Hospital Stay- patient unable to be discharged at this time due to --acute on chronic hypoxic and hypercapnic respiratory failure requiring BiPAP and high flow oxygen, Enterococcus bacteremia requiring IV antibiotics --anticipated discharge home with hospice on 07/19/2021 after a new oxygen concentrator has been delivered to patient's house- -. Patient currently has oxygen concentrator that delivers 5L O2. Patient will need oxygen concentrator that delivers 10L O2 delivered to the home prior to discharge  Status is: Inpatient   Disposition: The patient is from: Home              Anticipated d/c is to: Home with Hospice              Anticipated d/c date is: 1 day              Patient currently is not medically stable to d/c. Barriers: Not Clinically Stable-   Code Status :  -  Code Status: DNR   Family Communication:   (patient is alert, awake and coherent)  Wife at bedside  DVT Prophylaxis  :   - SCDs  heparin injection 5,000 Units Start: 07/15/21 0600 SCDs Start: 07/15/21 0127   Lab Results  Component Value Date   PLT 667 (H) 07/18/2021    Inpatient Medications  Scheduled Meds:  aspirin EC  81 mg Oral Daily   atorvastatin  40 mg Oral Daily   chlorhexidine  15 mL Mouth Rinse BID   Chlorhexidine Gluconate Cloth  6 each Topical Daily   feeding supplement  237 mL Oral TID BM   furosemide  20 mg Intravenous Q12H   gabapentin  600 mg Oral TID   heparin  5,000 Units Subcutaneous Q8H   ipratropium  0.5 mg Nebulization Q6H   levalbuterol  0.63 mg Nebulization Q6H   lidocaine  1 patch Transdermal Q24H   mouth rinse  15 mL Mouth Rinse q12n4p   melatonin  9 mg Oral QHS   methylPREDNISolone (SOLU-MEDROL) injection  40 mg Intravenous Q12H   mometasone-formoterol  2 puff Inhalation BID   multivitamin with minerals  1 tablet Oral Daily    pantoprazole  40 mg Oral Daily   vitamin B-12  1,000 mcg Oral Daily   Continuous Infusions:  ampicillin-sulbactam (UNASYN) IV 3 g (07/18/21 1216)   PRN Meds:.acetaminophen **OR** acetaminophen, albuterol, ALPRAZolam, ondansetron **OR** ondansetron (ZOFRAN) IV, oxyCODONE, polyethylene glycol   Anti-infectives (From admission, onward)    Start     Dose/Rate Route Frequency Ordered Stop   07/16/21 1230  Ampicillin-Sulbactam (UNASYN) 3 g in sodium chloride 0.9 % 100 mL IVPB        3 g 200 mL/hr over 30 Minutes Intravenous Every 6 hours 07/16/21 1143     07/16/21 1145  Ampicillin-Sulbactam (UNASYN) 3 g in sodium chloride 0.9 % 100 mL IVPB  Status:  Discontinued        3 g 200 mL/hr over 30 Minutes Intravenous Every 8 hours 07/16/21 1056 07/16/21 1143   07/15/21 2000  cefTRIAXone (ROCEPHIN) 1 g in sodium chloride 0.9 % 100 mL IVPB  Status:  Discontinued        1 g 200 mL/hr over 30 Minutes Intravenous Every 24 hours 07/15/21 0126 07/16/21 1053   07/14/21 2200  cefTRIAXone (ROCEPHIN) 1 g in sodium chloride 0.9 % 100 mL IVPB        1 g 200 mL/hr over 30 Minutes Intravenous  Once 07/14/21 2145 07/14/21 2235  07/14/21 2200  azithromycin (ZITHROMAX) 500 mg in sodium chloride 0.9 % 250 mL IVPB        500 mg 250 mL/hr over 60 Minutes Intravenous Every 24 hours 07/14/21 2145 07/17/21 2230         Subjective: Mayer Camel today has no fevers, no emesis,  No chest pain,   - -Breathing difficulties persist currently on 10 L of oxygen via nasal cannula -Oral intake is fair not great --Patient and wife requesting possible discharge home with hospice over the next day or 2 if he stabilizes   Objective: Vitals:   07/18/21 1000 07/18/21 1103 07/18/21 1400 07/18/21 1601  BP: 98/62     Pulse:  (!) 113  (!) 111  Resp: (!) 25 19  (!) 26  Temp:  97.8 F (36.6 C)  98 F (36.7 C)  TempSrc:  Oral  Oral  SpO2:  95% 97% 93%  Weight:      Height:        Intake/Output Summary (Last 24 hours)  at 07/18/2021 1607 Last data filed at 07/18/2021 1015 Gross per 24 hour  Intake 1187.93 ml  Output 1000 ml  Net 187.93 ml   Filed Weights   07/16/21 0438 07/17/21 0500 07/18/21 0427  Weight: 94.3 kg 91.5 kg 90.5 kg    Physical Exam  Gen:- Awake Alert, patient is a "pink puffer" HEENT:- [place].AT, No sclera icterus Nose- 8L/min Neck-Supple Neck,No JVD,.  Lungs-diminished breath sounds, scattered rhonchi bilaterally  CV- S1, S2 normal, regular  Abd-  +ve B.Sounds, Abd Soft, No tenderness,    Extremity/Skin:- No  edema, pedal pulses present  Psych-affect is appropriate, oriented x3 Neuro-generalized weakness no new focal deficits, no tremors MSK-right BKA  Data Reviewed: I have personally reviewed following labs and imaging studies  CBC: Recent Labs  Lab 07/14/21 1940 07/15/21 0342 07/16/21 0414 07/17/21 0415 07/18/21 0500  WBC 20.2* 18.0* 22.8* 22.5* 25.0*  NEUTROABS 17.3* 17.3*  --  20.9*  --   HGB 9.2* 8.1* 9.1* 9.3* 8.7*  HCT 29.9* 26.8* 30.3* 32.4* 30.0*  MCV 81.9 80.5 83.0 82.2 81.5  PLT 755* 637* 745* 692* 371*   Basic Metabolic Panel: Recent Labs  Lab 07/14/21 1940 07/15/21 0342 07/16/21 0414 07/17/21 0415  NA 135 135 138 141   141  K 3.6 4.5 4.2 4.4   4.5  CL 98 101 101 100   100  CO2 28 26 26 30   31   GLUCOSE 169* 165* 131* 136*   134*  BUN 19 18 27* 33*   33*  CREATININE 0.87 0.73 0.95 0.77   0.77  CALCIUM 8.8* 8.5* 9.0 9.3   9.3  MG  --  2.1  --   --   PHOS  --   --   --  2.6   GFR: Estimated Creatinine Clearance: 87.5 mL/min (by C-G formula based on SCr of 0.77 mg/dL). Liver Function Tests: Recent Labs  Lab 07/14/21 1940 07/15/21 0342 07/17/21 0415  AST 36 40 147*  ALT 24 24 210*  ALKPHOS 76 60 150*  BILITOT 0.7 0.6 0.2*  PROT 7.6 7.1 7.7  ALBUMIN 3.3* 3.0* 3.4*   3.4*   Cardiac Enzymes: No results for input(s): CKTOTAL, CKMB, CKMBINDEX, TROPONINI in the last 168 hours. BNP (last 3 results) No results for input(s): PROBNP in the last  8760 hours. HbA1C: No results for input(s): HGBA1C in the last 72 hours. Sepsis Labs: @LABRCNTIP (procalcitonin:4,lacticidven:4) ) Recent Results (from the past 240 hour(s))  Resp Panel by RT-PCR (Flu A&B, Covid) Nasopharyngeal Swab     Status: None   Collection Time: 07/14/21  8:47 PM   Specimen: Nasopharyngeal Swab; Nasopharyngeal(NP) swabs in vial transport medium  Result Value Ref Range Status   SARS Coronavirus 2 by RT PCR NEGATIVE NEGATIVE Final    Comment: (NOTE) SARS-CoV-2 target nucleic acids are NOT DETECTED.  The SARS-CoV-2 RNA is generally detectable in upper respiratory specimens during the acute phase of infection. The lowest concentration of SARS-CoV-2 viral copies this assay can detect is 138 copies/mL. A negative result does not preclude SARS-Cov-2 infection and should not be used as the sole basis for treatment or other patient management decisions. A negative result may occur with  improper specimen collection/handling, submission of specimen other than nasopharyngeal swab, presence of viral mutation(s) within the areas targeted by this assay, and inadequate number of viral copies(<138 copies/mL). A negative result must be combined with clinical observations, patient history, and epidemiological information. The expected result is Negative.  Fact Sheet for Patients:  EntrepreneurPulse.com.au  Fact Sheet for Healthcare Providers:  IncredibleEmployment.be  This test is no t yet approved or cleared by the [place] FDA and  has been authorized for detection and/or diagnosis of SARS-CoV-2 by FDA under an Emergency Use Authorization (EUA). This EUA will remain  in effect (meaning this test can be used) for the duration of the COVID-19 declaration under Section 564(b)(1) of the Act, 21 U.S.C.section 360bbb-3(b)(1), unless the authorization is terminated  or revoked sooner.       Influenza A by PCR NEGATIVE NEGATIVE Final    Influenza B by PCR NEGATIVE NEGATIVE Final    Comment: (NOTE) The Xpert Xpress SARS-CoV-2/FLU/RSV plus assay is intended as an aid in the diagnosis of influenza from Nasopharyngeal swab specimens and should not be used as a sole basis for treatment. Nasal washings and aspirates are unacceptable for Xpert Xpress SARS-CoV-2/FLU/RSV testing.  Fact Sheet for Patients: EntrepreneurPulse.com.au  Fact Sheet for Healthcare Providers: IncredibleEmployment.be  This test is not yet approved or cleared by the [place] FDA and has been authorized for detection and/or diagnosis of SARS-CoV-2 by FDA under an Emergency Use Authorization (EUA). This EUA will remain in effect (meaning this test can be used) for the duration of the COVID-19 declaration under Section 564(b)(1) of the Act, 21 U.S.C. section 360bbb-3(b)(1), unless the authorization is terminated or revoked.  Performed at [facility], [address], [place], [place] [address]   Culture, blood (routine x 2)     Status: Abnormal   Collection Time: 07/14/21 10:04 PM   Specimen: BLOOD  Result Value Ref Range Status   Specimen Description   Final    BLOOD RIGHT ANTECUBITAL Performed at [facility], [address] [place], [place] [address]    Special Requests   Final    BOTTLES DRAWN AEROBIC AND ANAEROBIC Blood Culture adequate volume Performed at [facility], [address], [place], [place] [address]    Culture  Setup Time   Final    ANAEROBIC BOTTLE ONLY aerobic bottle GRAM POSITIVE COCCI Gram Stain Report Called to,Read Back By and Verified With: KATIE LOOMIS @ 7741 ON 07/15/21 C VARNER RN DUSTIN EVERETT 07/16/21@00 :39 BY TW IN BOTH AEROBIC AND ANAEROBIC BOTTLES Performed at [facility] [address], [place], [place] [address]    Culture ENTEROCOCCUS FAECALIS (A)  Final   Report Status 07/18/2021 FINAL  Final   Organism ID, Bacteria ENTEROCOCCUS FAECALIS  Final  Susceptibility   Enterococcus faecalis - MIC*    AMPICILLIN <=2 SENSITIVE Sensitive     VANCOMYCIN 1 SENSITIVE Sensitive     GENTAMICIN SYNERGY RESISTANT Resistant     * ENTEROCOCCUS FAECALIS  Culture, blood (routine x 2)     Status: None (Preliminary result)   Collection Time: 07/14/21 10:04 PM   Specimen: BLOOD  Result Value Ref Range Status   Specimen Description BLOOD LEFT ANTECUBITAL  Final   Special Requests   Final    BOTTLES DRAWN AEROBIC AND ANAEROBIC Blood Culture adequate volume   Culture   Final    NO GROWTH 4 DAYS Performed at [facility], [address] [place], [place] [address]    Report Status PENDING  Incomplete  Blood Culture ID Panel (Reflexed)     Status: Abnormal   Collection Time: 07/14/21 10:04 PM  Result Value Ref Range Status   Enterococcus faecalis DETECTED (A) NOT DETECTED Final    Comment: CRITICAL RESULT CALLED TO, READ BACK BY AND VERIFIED WITH: RN DUSTIN EVERETT 07/16/21@00 :39 BY TW    Enterococcus Faecium NOT DETECTED NOT DETECTED Final   Listeria monocytogenes NOT DETECTED NOT DETECTED Final   Staphylococcus species NOT DETECTED NOT DETECTED Final   Staphylococcus aureus (BCID) NOT DETECTED NOT DETECTED Final   Staphylococcus epidermidis NOT DETECTED NOT DETECTED Final   Staphylococcus lugdunensis NOT DETECTED NOT DETECTED Final   Streptococcus species NOT DETECTED NOT DETECTED Final   Streptococcus agalactiae NOT DETECTED NOT DETECTED Final   Streptococcus pneumoniae NOT DETECTED NOT DETECTED Final   Streptococcus pyogenes NOT DETECTED NOT DETECTED Final   A.calcoaceticus-baumannii NOT DETECTED NOT DETECTED Final   Bacteroides fragilis NOT DETECTED NOT DETECTED Final   Enterobacterales NOT DETECTED NOT DETECTED Final   Enterobacter cloacae complex NOT DETECTED NOT DETECTED Final   Escherichia coli NOT DETECTED NOT DETECTED Final   Klebsiella aerogenes NOT DETECTED NOT DETECTED Final   Klebsiella oxytoca NOT DETECTED NOT DETECTED Final    Klebsiella pneumoniae NOT DETECTED NOT DETECTED Final   Proteus species NOT DETECTED NOT DETECTED Final   Salmonella species NOT DETECTED NOT DETECTED Final   Serratia marcescens NOT DETECTED NOT DETECTED Final   Haemophilus influenzae NOT DETECTED NOT DETECTED Final   Neisseria meningitidis NOT DETECTED NOT DETECTED Final   Pseudomonas aeruginosa NOT DETECTED NOT DETECTED Final   Stenotrophomonas maltophilia NOT DETECTED NOT DETECTED Final   Candida albicans NOT DETECTED NOT DETECTED Final   Candida auris NOT DETECTED NOT DETECTED Final   Candida glabrata NOT DETECTED NOT DETECTED Final   Candida krusei NOT DETECTED NOT DETECTED Final   Candida parapsilosis NOT DETECTED NOT DETECTED Final   Candida tropicalis NOT DETECTED NOT DETECTED Final   Cryptococcus neoformans/gattii NOT DETECTED NOT DETECTED Final   Vancomycin resistance NOT DETECTED NOT DETECTED Final    Comment: Performed at [facility], [address], [place], [place] [address]  MRSA Next Gen by PCR, Nasal     Status: None   Collection Time: 07/15/21 12:56 AM   Specimen: Nasal Mucosa; Nasal Swab  Result Value Ref Range Status   MRSA by PCR Next Gen NOT DETECTED NOT DETECTED Final    Comment: (NOTE) The GeneXpert MRSA Assay (FDA approved for NASAL specimens only), is one component of a comprehensive MRSA colonization surveillance program. It is not intended to diagnose MRSA infection nor to guide or monitor treatment for MRSA infections. Test performance is not FDA approved in patients less than 3 years old. Performed at  [address] [place], [place] [address]   Acid Fast Smear (AFB)     Status: None   Collection Time: 07/15/21 11:15 AM   Specimen: Pleural, Right; Body Fluid  Result Value Ref Range Status   AFB Specimen Processing Concentration  Final   Acid Fast Smear Negative  Final    Comment: (NOTE) Performed At: [facility] [place], [place] [id number] Rush Farmer MD [id number]    Source (AFB) PLEURAL  Final    Comment: Performed at [facility], [address], [place], [place] [address]  Culture, body fluid w Gram Stain-bottle     Status: None (Preliminary result)   Collection Time: 07/15/21 11:15 AM   Specimen: Pleura  Result Value Ref Range Status   Specimen Description PLEURAL  Final   Special Requests   Final    BOTTLES DRAWN AEROBIC AND ANAEROBIC Blood Culture adequate volume   Culture   Final    NO GROWTH 3 DAYS Performed at [facility], [address] [place], [place] [address]    Report Status PENDING  Incomplete  Gram stain     Status: None   Collection Time: 07/15/21 11:15 AM   Specimen: Pleura  Result Value Ref Range Status   Specimen Description PLEURAL  Final   Special Requests NONE  Final   Gram Stain   Final    WBC PRESENT, PREDOMINANTLY MONONUCLEAR NO ORGANISMS SEEN CYTOSPIN SMEAR Performed at [facility], [address], [place], [place] [address]    Report Status 07/15/2021 FINAL  Final      Radiology Studies: DG CHEST PORT 1 VIEW  Result Date: 07/17/2021 CLINICAL DATA:  Shortness of breath. EXAM: PORTABLE CHEST 1 VIEW COMPARISON:  07/16/2021 at 12:17 a.m. FINDINGS: 07/17/2021 at 5:08 a.m. A nearly 5 cm opacity is again noted laterally in right upper lobe in the location of a smaller prior spiculated nodule on the chest CT of 07/22/2020. This could be a superimposed infiltrate or interval enlarged mass. The heart is enlarged. Central vascular prominence is again seen, mild interstitial edema in the bases showing improvement, with interstitial edema previously in the upper lung fields having nearly resolved. There are small pleural effusions. Retrocardiac left lower lobe opacity could be due to atelectasis or consolidation. The remaining lungs clear. There is a stable mediastinum with aortic atherosclerosis. Osteopenia. IMPRESSION: 1. Improved interstitial edema, but with interval development of small pleural  effusions. 2. Increased opacity left lower lobe could be atelectasis or consolidation. 3. Nearly 5 cm opacity in the prior CT location of a 1.7 cm spiculated right upper lobe nodule. This could be an enlarging neoplasm or superimposed infiltrate. Chest CT is recommended preferably with contrast. Electronically Signed   By: Telford Nab M.D.   On: 07/17/2021 07:16     Scheduled Meds:  aspirin EC  81 mg Oral Daily   atorvastatin  40 mg Oral Daily   chlorhexidine  15 mL Mouth Rinse BID   Chlorhexidine Gluconate Cloth  6 each Topical Daily   feeding supplement  237 mL Oral TID BM   furosemide  20 mg Intravenous Q12H   gabapentin  600 mg Oral TID   heparin  5,000 Units Subcutaneous Q8H   ipratropium  0.5 mg Nebulization Q6H   levalbuterol  0.63 mg Nebulization Q6H   lidocaine  1 patch Transdermal Q24H   mouth rinse  15 mL Mouth Rinse q12n4p   melatonin  9 mg Oral QHS   methylPREDNISolone (  SOLU-MEDROL) injection  40 mg Intravenous Q12H   mometasone-formoterol  2 puff Inhalation BID   multivitamin with minerals  1 tablet Oral Daily   pantoprazole  40 mg Oral Daily   vitamin B-12  1,000 mcg Oral Daily   Continuous Infusions:  ampicillin-sulbactam (UNASYN) IV 3 g (07/18/21 1216)     LOS: 4 days   Roxan Hockey M.D on 07/18/2021 at 4:07 PM  Go to [email or web address] - for contact info  [facility] - Office  [phone]  If 7PM-7AM, please contact night-coverage [email or web address] Password [email or web address] 07/18/2021, 4:07 PM

## 2021-07-18 NOTE — Progress Notes (Addendum)
AP ICU07 [facility]) Hospital Liaison Note  Received request from Transitions of Care Manager Shade Flood, LCSW, for hospice services at home after discharge. Chart and patient information reviewed by [facility] physician. Hospice eligibility confirmed.  Spoke with wife Seraphim Trow to initiate education related to hospice philosophy, services and team approach to care. Patient/family verbalized understanding of information provided.   DME needs discussed. Patient has the following equipment in the home: hospital bed, tub bench, BSC, w/c, and walker. Patient also has oxygen concentrator that delivers 5L O2. Patient will need oxygen concentrator that delivers 10L O2 delivered to the home prior to discharge. Address has been verified and is correct in the chart. Nicki Reaper is the family contact to arrange time of equipment delivery.   Please send signed and completed DNR home with patient/family. Please provide prescriptions at discharge as needed to ensure ongoing symptom management.   ACC information and contact numbers given to family. Above information shared with [place].   Please do not hesitate to call with any hospice related questions or concerns.   Thank you for the opportunity to participate in this patient's care.   Nadene Rubins, RN, BSN [facility] Liaison [phone]

## 2021-07-18 NOTE — Progress Notes (Signed)
While checking patient during last rounds found patient to have solid stool in rectum. Removed several hard formed balls of dried stool from rectum digitally.

## 2021-07-19 LAB — COMPREHENSIVE METABOLIC PANEL
ALT: 119 U/L — ABNORMAL HIGH (ref 0–44)
AST: 46 U/L — ABNORMAL HIGH (ref 15–41)
Albumin: 3.1 g/dL — ABNORMAL LOW (ref 3.5–5.0)
Alkaline Phosphatase: 115 U/L (ref 38–126)
Anion gap: 8 (ref 5–15)
BUN: 41 mg/dL — ABNORMAL HIGH (ref 8–23)
CO2: 40 mmol/L — ABNORMAL HIGH (ref 22–32)
Calcium: 9.1 mg/dL (ref 8.9–10.3)
Chloride: 97 mmol/L — ABNORMAL LOW (ref 98–111)
Creatinine, Ser: 0.67 mg/dL (ref 0.61–1.24)
GFR, Estimated: >60 mL/min (ref >60)
Glucose, Bld: 148 mg/dL — ABNORMAL HIGH (ref 70–99)
Potassium: 4.4 mmol/L (ref 3.5–5.1)
Sodium: 145 mmol/L (ref 135–145)
Total Bilirubin: 0.7 mg/dL (ref 0.3–1.2)
Total Protein: 6.6 g/dL (ref 6.5–8.1)

## 2021-07-19 MED ORDER — POLYETHYLENE GLYCOL 3350 17 G PO PACK: 17.0000 g | PACK | Freq: Every day | ORAL | 0 refills | Status: AC

## 2021-07-19 MED ORDER — AMOXICILLIN-POT CLAVULANATE 875-125 MG PO TABS: 1.0000 | ORAL_TABLET | Freq: Two times a day (BID) | ORAL | 0 refills | Status: AC

## 2021-07-19 MED ORDER — BISACODYL 10 MG RE SUPP
10.0000 mg | Freq: Once | RECTAL | Status: AC
  Administered 2021-07-19: 10 mg via RECTAL
  Filled 2021-07-19: qty 1

## 2021-07-19 MED ORDER — BREZTRI AEROSPHERE 160-9-4.8 MCG/ACT IN AERO: INHALATION_SPRAY | RESPIRATORY_TRACT | 11 refills | Status: AC

## 2021-07-19 MED ORDER — PANTOPRAZOLE SODIUM 40 MG PO TBEC: 40.0000 mg | DELAYED_RELEASE_TABLET | Freq: Every day | ORAL | 1 refills | Status: AC

## 2021-07-19 MED ORDER — FUROSEMIDE 20 MG PO TABS: 20.0000 mg | ORAL_TABLET | Freq: Every day | ORAL | 3 refills | Status: AC

## 2021-07-19 MED ORDER — GABAPENTIN 600 MG PO TABS: 600.0000 mg | ORAL_TABLET | Freq: Two times a day (BID) | ORAL | 1 refills | Status: AC

## 2021-07-19 MED ORDER — ALPRAZOLAM 0.5 MG PO TABS: 0.5000 mg | ORAL_TABLET | Freq: Two times a day (BID) | ORAL | 0 refills | Status: AC | PRN

## 2021-07-19 MED ORDER — ALBUTEROL SULFATE (2.5 MG/3ML) 0.083% IN NEBU: 2.5000 mg | INHALATION_SOLUTION | RESPIRATORY_TRACT | 1 refills | Status: AC | PRN

## 2021-07-19 MED ORDER — OXYCODONE-ACETAMINOPHEN 10-325 MG PO TABS: 1.0000 | ORAL_TABLET | Freq: Four times a day (QID) | ORAL | 0 refills | Status: AC | PRN

## 2021-07-19 MED ORDER — HYDROCORTISONE ACETATE 25 MG RE SUPP
25.0000 mg | Freq: Two times a day (BID) | RECTAL | Status: DC
  Administered 2021-07-19 – 2021-07-20 (×3): 25 mg via RECTAL
  Filled 2021-07-19 (×2): qty 1

## 2021-07-19 MED ORDER — LACTULOSE 10 GM/15ML PO SOLN
60.0000 g | Freq: Once | ORAL | Status: AC
  Administered 2021-07-19: 60 g via ORAL
  Filled 2021-07-19: qty 90

## 2021-07-19 MED ORDER — ASPIRIN EC 81 MG PO TBEC: 81.0000 mg | DELAYED_RELEASE_TABLET | Freq: Every day | ORAL | 2 refills | Status: AC

## 2021-07-19 MED ORDER — HYDROCORTISONE ACETATE 25 MG RE SUPP: 25.0000 mg | Freq: Two times a day (BID) | RECTAL | 1 refills | Status: AC

## 2021-07-19 MED ORDER — PROAIR HFA 108 (90 BASE) MCG/ACT IN AERS
2.0000 | INHALATION_SPRAY | RESPIRATORY_TRACT | 7 refills | Status: AC | PRN
Start: 2021-07-19 — End: ?

## 2021-07-19 MED ORDER — ONDANSETRON HCL 4 MG PO TABS: 4.0000 mg | ORAL_TABLET | Freq: Every day | ORAL | 1 refills | Status: AC | PRN

## 2021-07-19 MED ORDER — PREDNISONE 20 MG PO TABS: 20.0000 mg | ORAL_TABLET | Freq: Every day | ORAL | 3 refills | Status: AC

## 2021-07-19 NOTE — TOC Transition Note (Signed)
Transition of Care [facility]) - CM/SW Discharge Note   Patient Details  Name: Joel Perez MRN: [id number] Date of Birth: 07-Feb-1946  Transition of Care [facility] CM/SW Contact:  Salome Arnt, LCSW Phone Number: 07/19/2021, 12:38 PM   Clinical Narrative:  Pt d/c home today with [facility]. Pt's wife reports DME has been delivered and Venia Carbon with [facility] is aware of 8L O2 requirement. LCSW confirmed address with pt's wife for EMS transport. [facility] arranged. No other needs reported at this time.      Final next level of care: Home w Hospice Care Barriers to Discharge: Barriers Resolved   Patient Goals and CMS Choice Patient states their goals for this hospitalization and ongoing recovery are:: return home CMS Medicare.gov Compare Post Acute Care list provided to:: Patient Represenative (must comment) Choice offered to / list presented to : Spouse  Discharge Placement                  Name of family member notified: wife Patient and family notified of of transfer: 07/19/21  Discharge Plan and Services In-house Referral: Clinical Social Work   Post Acute Care Choice: Hospice                    HH Arranged: RN, PT [facility] Agency: [place] Date [place]: 07/15/21 Time [place]: [phone] Representative spoke with at [place]: [place] ([place]) Interventions     Readmission Risk Interventions Readmission Risk Prevention Plan 07/15/2021  Transportation Screening Complete  Home Care Screening Complete  Medication Review (RN CM) Complete  Some recent data might be hidden

## 2021-07-19 NOTE — Discharge Summary (Signed)
Joel Perez, is a 76 y.o. male  DOB April 17, 1946  MRN [id number].  Admission date:  07/14/2021  Admitting Physician  [name], DO  Discharge Date:  07/19/2021   Primary MD  [name], MD  Recommendations for primary care physician for things to follow:   1)Avoid ibuprofen/Advil/Aleve/Motrin/Goody Powders/Naproxen/BC powders/Meloxicam/Diclofenac/Indomethacin and other Nonsteroidal anti-inflammatory medications as these will make you more likely to bleed and can cause stomach ulcers, can also cause Kidney problems.   2)The hospice team will help you with your medications--and also Help to keep you comfortable  3)you need oxygen at home at 5 L via nasal cannula (may use up to 8L/min if needed) continuously while awake and while asleep--- smoking or having open fires around oxygen can cause fire, significant injury and death  Admission Diagnosis  COPD exacerbation (HCC) [J44.1] Acute and chronic respiratory failure with hypoxia ([place]) [J96.21] Acute on chronic respiratory failure with hypoxia ([place]) [J96.21]   Discharge Diagnosis  COPD exacerbation ([place]) [J44.1] Acute and chronic respiratory failure with hypoxia (HCC) [J96.21] Acute on chronic respiratory failure with hypoxia (HCC) [J96.21]    Principal Problem:   Acute on chronic respiratory failure with hypoxia (HCC) Active Problems:   COPD with acute exacerbation (HCC)   Sepsis ([place])   RLL pneumonia   Mild protein-calorie malnutrition (HCC)   GERD (gastroesophageal reflux disease)   HTN (hypertension)      Past Medical History:  Diagnosis Date   Blind right eye 1985   Following a work accident   BPH (benign prostatic hyperplasia)    Capsular cataract of left eye    COPD (chronic obstructive pulmonary disease) ([place])    Dr. [name]   Current smoker    Long-term smoker. Not interested in quitting   Diverticulosis    With  intermittent diverticulitis   Erectile dysfunction    Essential hypertension    Several recorded blood pressures greater than 140/90.   H/O mitral valve prolapse    By report, but not confirmed by echo.   Low back pain    Chronic. Followed by Dr. Suella Broad   Mild aortic regurgitation 12/2013   Mild to moderate aortic regurgitation on echo   Neuropathy     Past Surgical History:  Procedure Laterality Date   ABDOMINAL AORTOGRAM W/LOWER EXTREMITY N/A 08/27/2020   Procedure: ABDOMINAL AORTOGRAM W/LOWER EXTREMITY;  Surgeon: Cherre Robins, MD;  Location: [facility];  Service: Cardiovascular;  Laterality: N/A;   AMPUTATION Right 08/28/2020   Procedure: AMPUTATION RAY FIFTH TOE;  Surgeon: Cherre Robins, MD;  Location: [facility];  Service: Vascular;  Laterality: Right;   AMPUTATION Right 11/27/2020   Procedure: RIGHT BELOW KNEE AMPUTATION;  Surgeon: Cherre Robins, MD;  Location: [facility];  Service: Vascular;  Laterality: Right;   APPLICATION OF WOUND VAC Right 08/28/2020   Procedure: APPLICATION OF WOUND VAC, right foot;  Surgeon: Cherre Robins, MD;  Location: [facility];  Service: Vascular;  Laterality: Right;   [place]  COLONOSCOPY WITH PROPOFOL N/A 03/18/2020   Procedure: COLONOSCOPY WITH PROPOFOL;  Surgeon: Otis Brace, MD;  Location: [facility];  Service: Gastroenterology;  Laterality: N/A;   ELBOW SURGERY     POLYPECTOMY  03/18/2020   Procedure: POLYPECTOMY;  Surgeon: Otis Brace, MD;  Location: [facility];  Service: Gastroenterology;;   TRANSTHORACIC ECHOCARDIOGRAM  12/29/2016   EF 60-65%. Moderate LVH. Mild aortic valve calcification. Mild to moderate regurgitation with no stenosis. Mitral valve annular calcification with no comment of prolapse or regurgitation.       HPI  from the history and physical done on the day of admission:  HPI: Joel Perez is a 76 y.o. male with history of blindness in the right eye, BPH, COPD, current smoker,  hypertension, neuropathy, and more presents ED with a chief complaint of dyspnea.  Patient reports that he had gradual onset of dyspnea over the last 3 weeks.  He became acutely worse about 3 or 4 hours prior to arrival.  Is associated with exertion.  Even minimal exertion like half rolling over in bed to grab something off of the bedside table causes him to be more short of breath.  He has an associated cough that is productive of light yellow sputum.  He reports that he was temporarily better when he was on prednisone which was about a week ago.  Patient reports that he has been out of his Judithann Sauger for a few days.  He has been using his ProAir inhaler about 3 times a day.  He also uses 4 L nasal cannula at baseline.  Patient admits to palpitations but denies fever and chest pain.  Patient reports a decreased appetite over the last 3 weeks.  He reports he is barely taking any solid p.o. intake, but is drinking protein shakes.  He drinks some water as well per his report.  He denies any nausea, vomiting, diarrhea, abdominal pain, dysuria.  Patient has no other complaints at this time.   Currently smoking, just a couple cigarettes a day.  He does not drink alcohol, he does not use illicit drugs, he is vaccinated for COVID, patient is full code.     Hospital Course:   Brief Summary:- a 76 y.o. male with history of blindness in the right eye, BPH, COPD, current smoker, hypertension, neuropathy admitted on 07/15/2021 with acute on chronic COPD exacerbation secondary to presumed pneumonia with worsening hypoxia and pleural effusion  Assessment and Plan:  A/p 1)Acute on Chronic Hypoxic and Hypercapnic Respiratory Failure--at baseline patient uses 4 L of oxygen at home recently was increased to 5 L of oxygen PTA -Oxygen requirement remains high - -Patient really does not like BiPAP, -Currently on 8 to 10 L of oxygen via nasal cannula -Patient and wife admits to ongoing tobacco use at home and poor compliance  with oxygen due to the need to smoke -Patient was initially treated with Rocephin/azithromycin and supplemental oxygen -Pulmonology consult from Dr. [name] appreciated Cxr from 07/17/21 with -Improved interstitial edema, but with interval development of small pleural effusions. 2. Increased opacity left lower lobe could be atelectasis or consolidation. 3. Nearly 5 cm opacity in the prior CT location of a 1.7 cm spiculated right upper lobe nodule. This could be an enlarging neoplasm or superimposed infiltrate. Chest CT is recommended preferably with contrast--- patient and wife would like to defer further work-up for possible without malignancy, they would like to go home with hospice -.PTA patient had oxygen concentrator that delivers 5L O2.  -Hospice team  now delivered a new oxygen concentrator that delivers 10L O2 delivered to the home prior to discharge -Okay to discharge home with hospice and high flow oxygen   2) Enterococcus sepsis secondary to right-sided pneumonia--management as above #1 -Primary blood cultures with anaerobic bottle growing GPC- -Strep pneumo antigen negative Legionella negative -COVID and influenza negative -Await final culture data -Persistent leukocytosis partly due to steroids -Discussed with ID physician Dr. [name], recommends IV Unasyn for Enterococcus bacteremia, will discharge home on p.o. Augmentin 10 days -No need for echo and repeat blood cultures as patient is transitioning to hospice -Okay to discontinue azithromycin as Legionella is negative   3) COPD exacerbation/tobacco abuse----patient with end-stage emphysema management as above #1 -Treated with IV Solu-Medrol, discharged home on  p.o. prednisone -Patient is not ready to quit smoking   4) right-sided pleural effusion in the setting of right-sided pneumonia and underlying COPD -Status post ultrasound-guided thoracentesis on 07/15/2021 with 1.1 L removed -Fluid studies noted   5) acute on chronic  anemia--Hgb is stable around 9, from a baseline around 10 continue to monitor closely   6) social/ethics--- -Overall prognosis is not good given advanced/end-stage emphysema and bacteremia --After conversation with pulmonologist patient and family request DNR/DNI status -Official palliative care consult appreciated---- -Hospice consult appreciated discharge home with hospice on 07/19/2021 on high flow oxygen  7)FEN--- speech pathologist eval appreciated, recommended Dysphagia 2 (fine chop);Thin liquid   8) acute on chronic preserved EF/diastolic dysfunction CHF--- echo from July 2022 with EF 60-65 % -BNP up to 1599- -Treated with IV Lasix , okay to discharge home on p.o. Lasix   Disposition/--discharge home with hospice and high flow oxygen   Disposition: The patient is from: Home              Anticipated d/c is to: Home with Hospice   Discharge Condition: Overall prognosis is poor  Follow UP--- hospice team  Consults obtained -pulmonology/palliative care/hospice  Diet and Activity recommendation:  As advised  Discharge Instructions    Discharge Instructions     Call MD for:  difficulty breathing, headache or visual disturbances   Complete by: As directed    Call MD for:  persistant dizziness or light-headedness   Complete by: As directed    Call MD for:  persistant nausea and vomiting   Complete by: As directed    Call MD for:  temperature >100.4   Complete by: As directed    Diet - low sodium heart healthy   Complete by: As directed    Discharge instructions   Complete by: As directed    1)Avoid ibuprofen/Advil/Aleve/Motrin/Goody Powders/Naproxen/BC powders/Meloxicam/Diclofenac/Indomethacin and other Nonsteroidal anti-inflammatory medications as these will make you more likely to bleed and can cause stomach ulcers, can also cause Kidney problems.   2)The hospice team will help you with your medications--and also Help to keep you comfortable  3)you need oxygen at home  at 5 L via nasal cannula (may use up to 8L/min if needed) continuously while awake and while asleep--- smoking or having open fires around oxygen can cause fire, significant injury and death   Increase activity slowly   Complete by: As directed          Discharge Medications     Allergies as of 07/19/2021       Reactions   Other Swelling, Other (See Comments)   Farmed Fish (tightness in throat & lip swelling)   Codeine Rash   Sulfa Antibiotics Hives  Medication List     STOP taking these medications    atorvastatin 40 MG tablet Commonly known as: LIPITOR   losartan 25 MG tablet Commonly known as: COZAAR   oxyCODONE 5 MG immediate release tablet Commonly known as: Oxy IR/ROXICODONE   vitamin B-12 1000 MCG tablet Commonly known as: CYANOCOBALAMIN   vitamin C 500 MG tablet Commonly known as: ASCORBIC ACID       TAKE these medications    acetaminophen 325 MG tablet Commonly known as: TYLENOL Take 2 tablets (650 mg total) by mouth every 6 (six) hours.   albuterol (2.5 MG/3ML) 0.083% nebulizer solution Commonly known as: PROVENTIL Take 3 mLs (2.5 mg total) by nebulization every 2 (two) hours as needed for shortness of breath or wheezing. What changed:  See the new instructions. Another medication with the same name was removed. Continue taking this medication, and follow the directions you see here.   ProAir HFA 108 (90 Base) MCG/ACT inhaler Generic drug: albuterol Inhale 2 puffs into the lungs every 4 (four) hours as needed for wheezing or shortness of breath. What changed:  See the new instructions. Another medication with the same name was removed. Continue taking this medication, and follow the directions you see here.   ALPRAZolam 0.5 MG tablet Commonly known as: XANAX Take 1 tablet (0.5 mg total) by mouth 2 (two) times daily as needed for anxiety.   amoxicillin-clavulanate 875-125 MG tablet Commonly known as: Augmentin Take 1 tablet by mouth  2 (two) times daily for 10 days.   aspirin EC 81 MG tablet Take 1 tablet (81 mg total) by mouth daily with breakfast. Swallow whole. What changed: when to take this   Breztri Aerosphere 160-9-4.8 MCG/ACT Aero Generic drug: Budeson-Glycopyrrol-Formoterol Take 2 puffs first thing in am and then another 2 puffs about 12 hours later.   furosemide 20 MG tablet Commonly known as: LASIX Take 1 tablet (20 mg total) by mouth daily.   gabapentin 600 MG tablet Commonly known as: NEURONTIN Take 1 tablet (600 mg total) by mouth 2 (two) times daily. What changed: how much to take   hydrocortisone 25 MG suppository Commonly known as: ANUSOL-HC Place 1 suppository (25 mg total) rectally every 12 (twelve) hours.   Melatonin 10 MG Tabs Take 1 tablet by mouth at bedtime.   multivitamin with minerals Tabs tablet Take 1 tablet by mouth daily.   ondansetron 4 MG tablet Commonly known as: Zofran Take 1 tablet (4 mg total) by mouth daily as needed for nausea or vomiting.   oxyCODONE-acetaminophen 10-325 MG tablet Commonly known as: PERCOCET Take 1 tablet by mouth 4 (four) times daily as needed for pain. What changed: reasons to take this   pantoprazole 40 MG tablet Commonly known as: PROTONIX Take 1 tablet (40 mg total) by mouth daily.   polyethylene glycol 17 g packet Commonly known as: MIRALAX / GLYCOLAX Take 17 g by mouth daily. What changed:  when to take this reasons to take this   predniSONE 20 MG tablet Commonly known as: DELTASONE Take 1 tablet (20 mg total) by mouth daily with breakfast. 2 until better then one daily What changed:  medication strength how much to take how to take this when to take this        Major procedures and Radiology Reports - PLEASE review detailed and final reports for all details, in brief -  DG Chest 1 View  Result Date: 07/15/2021 CLINICAL DATA:  Post RIGHT thoracentesis, history hypertension, COPD, RIGHT upper  lobe nodule EXAM: CHEST  1  VIEW COMPARISON:  07/14/2021 FINDINGS: Enlargement of cardiac silhouette. Mediastinal contours stable with atherosclerotic calcification aorta noted. Again identified RIGHT upper lobe nodular density and RIGHT basilar atelectasis. Decreased RIGHT pleural effusion. No pneumothorax following thoracentesis. IMPRESSION: No pneumothorax following thoracentesis. Persistent RIGHT upper lobe nodule. Electronically Signed   By: Lavonia Dana M.D.   On: 07/15/2021 11:41   DG CHEST PORT 1 VIEW  Result Date: 07/17/2021 CLINICAL DATA:  Shortness of breath. EXAM: PORTABLE CHEST 1 VIEW COMPARISON:  07/16/2021 at 12:17 a.m. FINDINGS: 07/17/2021 at 5:08 a.m. A nearly 5 cm opacity is again noted laterally in right upper lobe in the location of a smaller prior spiculated nodule on the chest CT of 07/22/2020. This could be a superimposed infiltrate or interval enlarged mass. The heart is enlarged. Central vascular prominence is again seen, mild interstitial edema in the bases showing improvement, with interstitial edema previously in the upper lung fields having nearly resolved. There are small pleural effusions. Retrocardiac left lower lobe opacity could be due to atelectasis or consolidation. The remaining lungs clear. There is a stable mediastinum with aortic atherosclerosis. Osteopenia. IMPRESSION: 1. Improved interstitial edema, but with interval development of small pleural effusions. 2. Increased opacity left lower lobe could be atelectasis or consolidation. 3. Nearly 5 cm opacity in the prior CT location of a 1.7 cm spiculated right upper lobe nodule. This could be an enlarging neoplasm or superimposed infiltrate. Chest CT is recommended preferably with contrast. Electronically Signed   By: Telford Nab M.D.   On: 07/17/2021 07:16   DG Chest Port 1 View  Result Date: 07/16/2021 CLINICAL DATA:  Worsening hypoxia. EXAM: PORTABLE CHEST 1 VIEW COMPARISON:  July 15, 2021 FINDINGS: Mild to moderate severity diffusely  increased interstitial lung markings are seen. Stable mild to moderate severity areas of airspace disease are seen within the retrocardiac region of the left lung base and periphery of the upper right lung. There is no evidence of a pleural effusion or pneumothorax. The cardiac silhouette is mildly enlarged and unchanged in size. The visualized skeletal structures are unremarkable. IMPRESSION: 1. Stable mild to moderate severity left basilar and mid to upper right lung airspace disease. Correlation with chest CT is recommended to exclude an underlying neoplastic process. 2. Mild to moderate severity interstitial edema. Electronically Signed   By: Virgina Norfolk M.D.   On: 07/16/2021 00:32   DG Chest Portable 1 View  Result Date: 07/14/2021 CLINICAL DATA:  Dyspnea EXAM: PORTABLE CHEST 1 VIEW COMPARISON:  11/27/2020, CT 07/22/2020 FINDINGS: Emphysematous disease and bronchitic changes. The left lung is grossly clear. Small right-sided pleural effusion with possible loculation. Vague right upper lobe opacity measuring 3.2 cm, probably representing interval growth of previously noted lung nodule. Stable cardiomediastinal silhouette. Aortic atherosclerosis. Airspace disease at the right base IMPRESSION: 1. Emphysema with small probably loculated right pleural effusion and airspace disease at the right base which may be due to atelectasis or pneumonia 2. Ill-defined right upper lobe opacity likely represents enlargement of previously noted pulmonary nodule, recommend follow-up chest CT. Electronically Signed   By: Donavan Foil M.D.   On: 07/14/2021 20:57   US THORACENTESIS ASP PLEURAL SPACE W/IMG GUIDE  Result Date: 07/15/2021 INDICATION: Right pleural effusion COPD exacerbation EXAM: ULTRASOUND GUIDED Right THORACENTESIS MEDICATIONS: 10 cc 15 lidocaine. COMPLICATIONS: None immediate. PROCEDURE: An ultrasound guided thoracentesis was thoroughly discussed with the patient and questions answered. The benefits,  risks, alternatives and complications were also discussed. The patient  understands and wishes to proceed with the procedure. Written consent was obtained. Ultrasound was performed to localize and mark an adequate pocket of fluid in the right chest. The area was then prepped and draped in the normal sterile fashion. 1% Lidocaine was used for local anesthesia. Under ultrasound guidance a Yueh catheter was introduced. Thoracentesis was performed. The catheter was removed and a dressing applied. FINDINGS: A total of approximately 1.1 liter of clear yellow fluid was removed. Samples were sent to the laboratory as requested by the clinical team. IMPRESSION: Successful ultrasound guided R thoracentesis yielding 1.1 Liters of pleural fluid. Read by Monia Sabal [facility] Electronically Signed   By: Lavonia Dana M.D.   On: 07/15/2021 11:41    Micro Results   Recent Results (from the past 240 hour(s))  Resp Panel by RT-PCR (Flu A&B, Covid) Nasopharyngeal Swab     Status: None   Collection Time: 07/14/21  8:47 PM   Specimen: Nasopharyngeal Swab; Nasopharyngeal(NP) swabs in vial transport medium  Result Value Ref Range Status   SARS Coronavirus 2 by RT PCR NEGATIVE NEGATIVE Final    Comment: (NOTE) SARS-CoV-2 target nucleic acids are NOT DETECTED.  The SARS-CoV-2 RNA is generally detectable in upper respiratory specimens during the acute phase of infection. The lowest concentration of SARS-CoV-2 viral copies this assay can detect is 138 copies/mL. A negative result does not preclude SARS-Cov-2 infection and should not be used as the sole basis for treatment or other patient management decisions. A negative result may occur with  improper specimen collection/handling, submission of specimen other than nasopharyngeal swab, presence of viral mutation(s) within the areas targeted by this assay, and inadequate number of viral copies(<138 copies/mL). A negative result must be combined with clinical observations,  patient history, and epidemiological information. The expected result is Negative.  Fact Sheet for Patients:  EntrepreneurPulse.com.au  Fact Sheet for Healthcare Providers:  IncredibleEmployment.be  This test is no t yet approved or cleared by the [place] FDA and  has been authorized for detection and/or diagnosis of SARS-CoV-2 by FDA under an Emergency Use Authorization (EUA). This EUA will remain  in effect (meaning this test can be used) for the duration of the COVID-19 declaration under Section 564(b)(1) of the Act, 21 U.S.C.section 360bbb-3(b)(1), unless the authorization is terminated  or revoked sooner.       Influenza A by PCR NEGATIVE NEGATIVE Final   Influenza B by PCR NEGATIVE NEGATIVE Final    Comment: (NOTE) The Xpert Xpress SARS-CoV-2/FLU/RSV plus assay is intended as an aid in the diagnosis of influenza from Nasopharyngeal swab specimens and should not be used as a sole basis for treatment. Nasal washings and aspirates are unacceptable for Xpert Xpress SARS-CoV-2/FLU/RSV testing.  Fact Sheet for Patients: EntrepreneurPulse.com.au  Fact Sheet for Healthcare Providers: IncredibleEmployment.be  This test is not yet approved or cleared by the [place] FDA and has been authorized for detection and/or diagnosis of SARS-CoV-2 by FDA under an Emergency Use Authorization (EUA). This EUA will remain in effect (meaning this test can be used) for the duration of the COVID-19 declaration under Section 564(b)(1) of the Act, 21 U.S.C. section 360bbb-3(b)(1), unless the authorization is terminated or revoked.  Performed at [facility], [address], [place], [place] [address]   Culture, blood (routine x 2)     Status: Abnormal   Collection Time: 07/14/21 10:04 PM   Specimen: BLOOD  Result Value Ref Range Status   Specimen Description   Final    BLOOD  RIGHT ANTECUBITAL Performed at [facility], [address], [place], [place] [address]    Special Requests   Final    BOTTLES DRAWN AEROBIC AND ANAEROBIC Blood Culture adequate volume Performed at [facility], [address] [place], [place] [address]    Culture  Setup Time   Final    ANAEROBIC BOTTLE ONLY aerobic bottle GRAM POSITIVE COCCI Gram Stain Report Called to,Read Back By and Verified With: KATIE LOOMIS @ 6226 ON 07/15/21 C VARNER RN DUSTIN EVERETT 07/16/21@00 :39 BY TW IN BOTH AEROBIC AND ANAEROBIC BOTTLES Performed at [facility], [place] [address] [place], [place] [address]    Culture ENTEROCOCCUS FAECALIS (A)  Final   Report Status 07/18/2021 FINAL  Final   Organism ID, Bacteria ENTEROCOCCUS FAECALIS  Final      Susceptibility   Enterococcus faecalis - MIC*    AMPICILLIN <=2 SENSITIVE Sensitive     VANCOMYCIN 1 SENSITIVE Sensitive     GENTAMICIN SYNERGY RESISTANT Resistant     * ENTEROCOCCUS FAECALIS  Culture, blood (routine x 2)     Status: None (Preliminary result)   Collection Time: 07/14/21 10:04 PM   Specimen: BLOOD  Result Value Ref Range Status   Specimen Description BLOOD LEFT ANTECUBITAL  Final   Special Requests   Final    BOTTLES DRAWN AEROBIC AND ANAEROBIC Blood Culture adequate volume   Culture   Final    NO GROWTH 4 DAYS Performed at [facility], [address], [place], [place] [address]    Report Status PENDING  Incomplete  Blood Culture ID Panel (Reflexed)     Status: Abnormal   Collection Time: 07/14/21 10:04 PM  Result Value Ref Range Status   Enterococcus faecalis DETECTED (A) NOT DETECTED Final    Comment: CRITICAL RESULT CALLED TO, READ BACK BY AND VERIFIED WITH: RN DUSTIN EVERETT 07/16/21@00 :39 BY TW    Enterococcus Faecium NOT DETECTED NOT DETECTED Final   Listeria monocytogenes NOT DETECTED NOT DETECTED Final   Staphylococcus species NOT DETECTED NOT DETECTED Final   Staphylococcus aureus (BCID) NOT DETECTED NOT DETECTED Final   Staphylococcus epidermidis NOT DETECTED  NOT DETECTED Final   Staphylococcus lugdunensis NOT DETECTED NOT DETECTED Final   Streptococcus species NOT DETECTED NOT DETECTED Final   Streptococcus agalactiae NOT DETECTED NOT DETECTED Final   Streptococcus pneumoniae NOT DETECTED NOT DETECTED Final   Streptococcus pyogenes NOT DETECTED NOT DETECTED Final   A.calcoaceticus-baumannii NOT DETECTED NOT DETECTED Final   Bacteroides fragilis NOT DETECTED NOT DETECTED Final   Enterobacterales NOT DETECTED NOT DETECTED Final   Enterobacter cloacae complex NOT DETECTED NOT DETECTED Final   Escherichia coli NOT DETECTED NOT DETECTED Final   Klebsiella aerogenes NOT DETECTED NOT DETECTED Final   Klebsiella oxytoca NOT DETECTED NOT DETECTED Final   Klebsiella pneumoniae NOT DETECTED NOT DETECTED Final   Proteus species NOT DETECTED NOT DETECTED Final   Salmonella species NOT DETECTED NOT DETECTED Final   Serratia marcescens NOT DETECTED NOT DETECTED Final   Haemophilus influenzae NOT DETECTED NOT DETECTED Final   Neisseria meningitidis NOT DETECTED NOT DETECTED Final   Pseudomonas aeruginosa NOT DETECTED NOT DETECTED Final   Stenotrophomonas maltophilia NOT DETECTED NOT DETECTED Final   Candida albicans NOT DETECTED NOT DETECTED Final   Candida auris NOT DETECTED NOT DETECTED Final   Candida glabrata NOT DETECTED NOT DETECTED Final   Candida krusei NOT DETECTED NOT DETECTED Final   Candida parapsilosis NOT DETECTED NOT DETECTED Final   Candida tropicalis NOT DETECTED  NOT DETECTED Final   Cryptococcus neoformans/gattii NOT DETECTED NOT DETECTED Final   Vancomycin resistance NOT DETECTED NOT DETECTED Final    Comment: Performed at [facility], [place] [address] [place], [place] [address]  MRSA Next Gen by PCR, Nasal     Status: None   Collection Time: 07/15/21 12:56 AM   Specimen: Nasal Mucosa; Nasal Swab  Result Value Ref Range Status   MRSA by PCR Next Gen NOT DETECTED NOT DETECTED Final    Comment: (NOTE) The GeneXpert MRSA Assay  (FDA approved for NASAL specimens only), is one component of a comprehensive MRSA colonization surveillance program. It is not intended to diagnose MRSA infection nor to guide or monitor treatment for MRSA infections. Test performance is not FDA approved in patients less than 35 years old. Performed at [facility], [address] [place], [place] [address]   Acid Fast Smear (AFB)     Status: None   Collection Time: 07/15/21 11:15 AM   Specimen: Pleural, Right; Body Fluid  Result Value Ref Range Status   AFB Specimen Processing Concentration  Final   Acid Fast Smear Negative  Final    Comment: (NOTE) Performed At: [facility] [place], [place] [id number] Rush Farmer MD LE:[id number]    Source (AFB) PLEURAL  Final    Comment: Performed at [facility], [address] [place], [place] [address]  Culture, body fluid w Gram Stain-bottle     Status: None (Preliminary result)   Collection Time: 07/15/21 11:15 AM   Specimen: Pleura  Result Value Ref Range Status   Specimen Description PLEURAL  Final   Special Requests   Final    BOTTLES DRAWN AEROBIC AND ANAEROBIC Blood Culture adequate volume   Culture   Final    NO GROWTH 3 DAYS Performed at [facility], [address], [place], [place] [address]    Report Status PENDING  Incomplete  Gram stain     Status: None   Collection Time: 07/15/21 11:15 AM   Specimen: Pleura  Result Value Ref Range Status   Specimen Description PLEURAL  Final   Special Requests NONE  Final   Gram Stain   Final    WBC PRESENT, PREDOMINANTLY MONONUCLEAR NO ORGANISMS SEEN CYTOSPIN SMEAR Performed at [facility], [address], [place], [place] [address]    Report Status 07/15/2021 FINAL  Final    Today   Subjective    Mayer Camel today has no new complaints  -PTA patient had oxygen concentrator that delivers 5L O2.  -Hospice team now delivered a new oxygen concentrator that delivers 10L O2 delivered to the home prior to  discharge -Okay to discharge home with hospice and high flow oxygen -Oral intake is fair          Patient has been seen and examined prior to discharge   Objective   Blood pressure 140/61, pulse (!) 28, temperature (!) 96.2 F (35.7 C), temperature source Axillary, resp. rate (!) 25, height 5\' 10"  (1.778 m), weight 91.2 kg, SpO2 (!) 80 %.   Intake/Output Summary (Last 24 hours) at 07/19/2021 1258 Last data filed at 07/19/2021 0741 Gross per 24 hour  Intake 1117 ml  Output 1400 ml  Net -283 ml    Exam   Gen:- Awake Alert, patient is a "pink puffer" HEENT:- [place].AT, No sclera icterus Nose- 8L/min Neck-Supple Neck,No JVD,.  Lungs-improving air movement, no wheezing CV- S1, S2 normal, regular  Abd-  +ve B.Sounds, Abd Soft,  No tenderness,    Extremity/Skin:- No  edema, pedal pulses present  Psych-affect is appropriate, oriented x3 Neuro-generalized weakness no new focal deficits, no tremors MSK-right BKA   Data Review   CBC w Diff:  Lab Results  Component Value Date   WBC 25.0 (H) 07/18/2021   HGB 8.7 (L) 07/18/2021   HCT 30.0 (L) 07/18/2021   PLT 667 (H) 07/18/2021   LYMPHOPCT 3 07/17/2021   MONOPCT 3 07/17/2021   EOSPCT 0 07/17/2021   BASOPCT 0 07/17/2021    CMP:  Lab Results  Component Value Date   NA 145 07/19/2021   NA 140 01/04/2019   K 4.4 07/19/2021   CL 97 (L) 07/19/2021   CO2 40 (H) 07/19/2021   BUN 41 (H) 07/19/2021   BUN 12 01/04/2019   CREATININE 0.67 07/19/2021   PROT 6.6 07/19/2021   PROT 7.8 04/08/2017   ALBUMIN 3.1 (L) 07/19/2021   ALBUMIN 4.7 04/08/2017   BILITOT 0.7 07/19/2021   BILITOT 0.4 04/08/2017   ALKPHOS 115 07/19/2021   AST 46 (H) 07/19/2021   ALT 119 (H) 07/19/2021  .  Total Discharge time is about 33 minutes  Roxan Hockey M.D on 07/19/2021 at 12:58 PM  Go to [email or web address] -  for contact info  [facility] - Office  [phone]

## 2021-07-19 NOTE — Discharge Instructions (Signed)
1)Avoid ibuprofen/Advil/Aleve/Motrin/Goody Powders/Naproxen/BC powders/Meloxicam/Diclofenac/Indomethacin and other Nonsteroidal anti-inflammatory medications as these will make you more likely to bleed and can cause stomach ulcers, can also cause Kidney problems.   2)The hospice team will help you with your medications--and also Help to keep you comfortable  3)you need oxygen at home at 5 L via nasal cannula (may use up to 8L/min if needed) continuously while awake and while asleep--- smoking or having open fires around oxygen can cause fire, significant injury and death

## 2021-07-19 NOTE — Plan of Care (Signed)

## 2021-07-20 LAB — CULTURE, BODY FLUID W GRAM STAIN -BOTTLE
Culture: NO GROWTH
Special Requests: ADEQUATE

## 2021-07-20 LAB — CULTURE, BLOOD (ROUTINE X 2)
Culture: NO GROWTH
Special Requests: ADEQUATE

## 2021-07-20 NOTE — Progress Notes (Signed)
°-- °  Patient was discharged home on 07/19/2021 with hospice services, however EMS transport could not pick patient up until a.m. of 07/20/2021 - Plan of care reviewed again with patient and patient's wife at bedside on 07/20/2021, questions answered - Please see full discharge summary dated 07/19/2021 - -Hospice Team has delivered necessary equipment for patient already over the weekend This is a no charge note  Roxan Hockey, MD

## 2021-07-20 NOTE — Progress Notes (Signed)
Nsg Discharge Note  Admit Date:  07/14/2021 Discharge date: 07/20/2021   Joel Perez to be D/C'd Home with hospice services per MD order.  AVS completed.  Copy for chart, and copy for patient signed, and dated. Patient/caregiver able to verbalize understanding.  Discharge Medication: Allergies as of 07/20/2021       Reactions   Other Swelling, Other (See Comments)   Farmed Fish (tightness in throat & lip swelling)   Codeine Rash   Sulfa Antibiotics Hives        Medication List     STOP taking these medications    atorvastatin 40 MG tablet Commonly known as: LIPITOR   losartan 25 MG tablet Commonly known as: COZAAR   oxyCODONE 5 MG immediate release tablet Commonly known as: Oxy IR/ROXICODONE   vitamin B-12 1000 MCG tablet Commonly known as: CYANOCOBALAMIN   vitamin C 500 MG tablet Commonly known as: ASCORBIC ACID       TAKE these medications    acetaminophen 325 MG tablet Commonly known as: TYLENOL Take 2 tablets (650 mg total) by mouth every 6 (six) hours.   albuterol (2.5 MG/3ML) 0.083% nebulizer solution Commonly known as: PROVENTIL Take 3 mLs (2.5 mg total) by nebulization every 2 (two) hours as needed for shortness of breath or wheezing. What changed:  See the new instructions. Another medication with the same name was removed. Continue taking this medication, and follow the directions you see here.   ProAir HFA 108 (90 Base) MCG/ACT inhaler Generic drug: albuterol Inhale 2 puffs into the lungs every 4 (four) hours as needed for wheezing or shortness of breath. What changed:  See the new instructions. Another medication with the same name was removed. Continue taking this medication, and follow the directions you see here.   ALPRAZolam 0.5 MG tablet Commonly known as: XANAX Take 1 tablet (0.5 mg total) by mouth 2 (two) times daily as needed for anxiety.   amoxicillin-clavulanate 875-125 MG tablet Commonly known as: Augmentin Take 1 tablet by  mouth 2 (two) times daily for 10 days.   aspirin EC 81 MG tablet Take 1 tablet (81 mg total) by mouth daily with breakfast. Swallow whole. What changed: when to take this   Breztri Aerosphere 160-9-4.8 MCG/ACT Aero Generic drug: Budeson-Glycopyrrol-Formoterol Take 2 puffs first thing in am and then another 2 puffs about 12 hours later.   furosemide 20 MG tablet Commonly known as: LASIX Take 1 tablet (20 mg total) by mouth daily.   gabapentin 600 MG tablet Commonly known as: NEURONTIN Take 1 tablet (600 mg total) by mouth 2 (two) times daily. What changed: how much to take   hydrocortisone 25 MG suppository Commonly known as: ANUSOL-HC Place 1 suppository (25 mg total) rectally every 12 (twelve) hours.   Melatonin 10 MG Tabs Take 1 tablet by mouth at bedtime.   multivitamin with minerals Tabs tablet Take 1 tablet by mouth daily.   ondansetron 4 MG tablet Commonly known as: Zofran Take 1 tablet (4 mg total) by mouth daily as needed for nausea or vomiting.   oxyCODONE-acetaminophen 10-325 MG tablet Commonly known as: PERCOCET Take 1 tablet by mouth 4 (four) times daily as needed for pain. What changed: reasons to take this   pantoprazole 40 MG tablet Commonly known as: PROTONIX Take 1 tablet (40 mg total) by mouth daily.   polyethylene glycol 17 g packet Commonly known as: MIRALAX / GLYCOLAX Take 17 g by mouth daily. What changed:  when to take this reasons  to take this   predniSONE 20 MG tablet Commonly known as: DELTASONE Take 1 tablet (20 mg total) by mouth daily with breakfast. 2 until better then one daily What changed:  medication strength how much to take how to take this when to take this        Discharge Assessment: Vitals:   07/20/21 0918 07/20/21 1000  BP:  123/62  Pulse: 100 95  Resp: 19 (!) 21  Temp:    SpO2: 93% 100%   Skin clean, dry and intact without evidence of skin break down, no evidence of skin tears noted. IV catheter  discontinued intact. Site without signs and symptoms of complications - no redness or edema noted at insertion site, patient denies c/o pain - only slight tenderness at site.  Dressing with slight pressure applied.  D/c Instructions-Education: Discharge instructions given to patient/family with verbalized understanding. D/c education completed with patient/family including follow up instructions, medication list, d/c activities limitations if indicated, with other d/c instructions as indicated by MD - patient able to verbalize understanding, all questions fully answered. Patient instructed to return to ED, call 911, or call MD for any changes in condition.  Patient escorted via EMS and D/C home. Carney Corners, RN 07/20/2021 10:56 AM

## 2021-07-20 NOTE — Progress Notes (Signed)
Has not needed BiPAP for last 2 days. ( Sleep Nights)

## 2021-07-24 ENCOUNTER — Telehealth: Payer: Self-pay | Admitting: Internal Medicine

## 2021-07-24 NOTE — Telephone Encounter (Signed)
Please place sympathy card on my desk  ?

## 2021-07-24 NOTE — Telephone Encounter (Signed)
I have changed pt's status in chart. Tried to call pt's spouse to express my condolences but unable to reach her and unable to leave a VM.  Routing message to Dr. [name] as an Juluis Rainier. ?

## 2021-08-22 DEATH — deceased

## 2022-06-01 IMAGING — DX DG CHEST 2V
2 series · 2 of 2 positions shown · non-contrast
Comparison: 04/05/2018

CLINICAL DATA: Short of breath, COPD, history of tobacco abuse

EXAM:
CHEST - 2 VIEW

[chest pa]
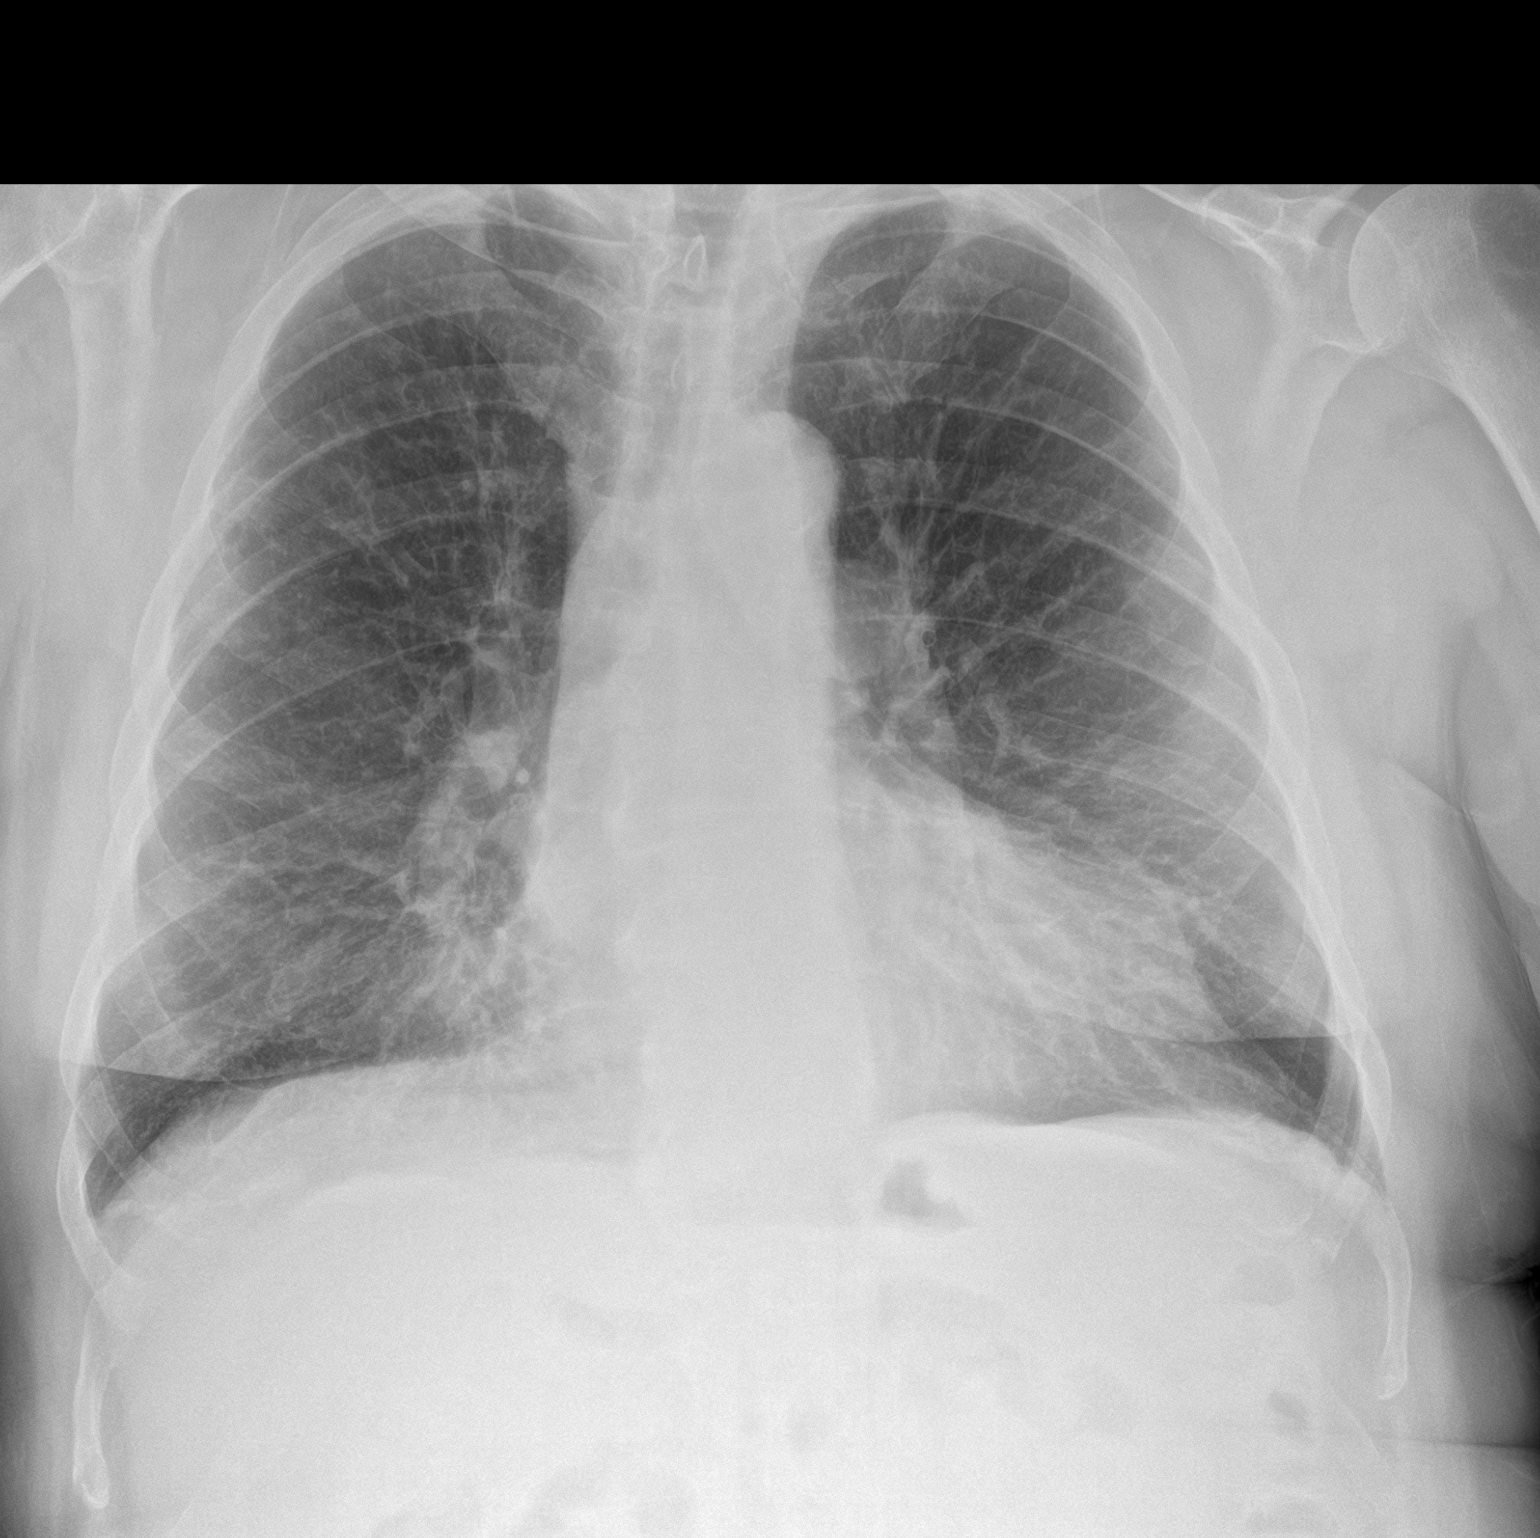

[chest lat]
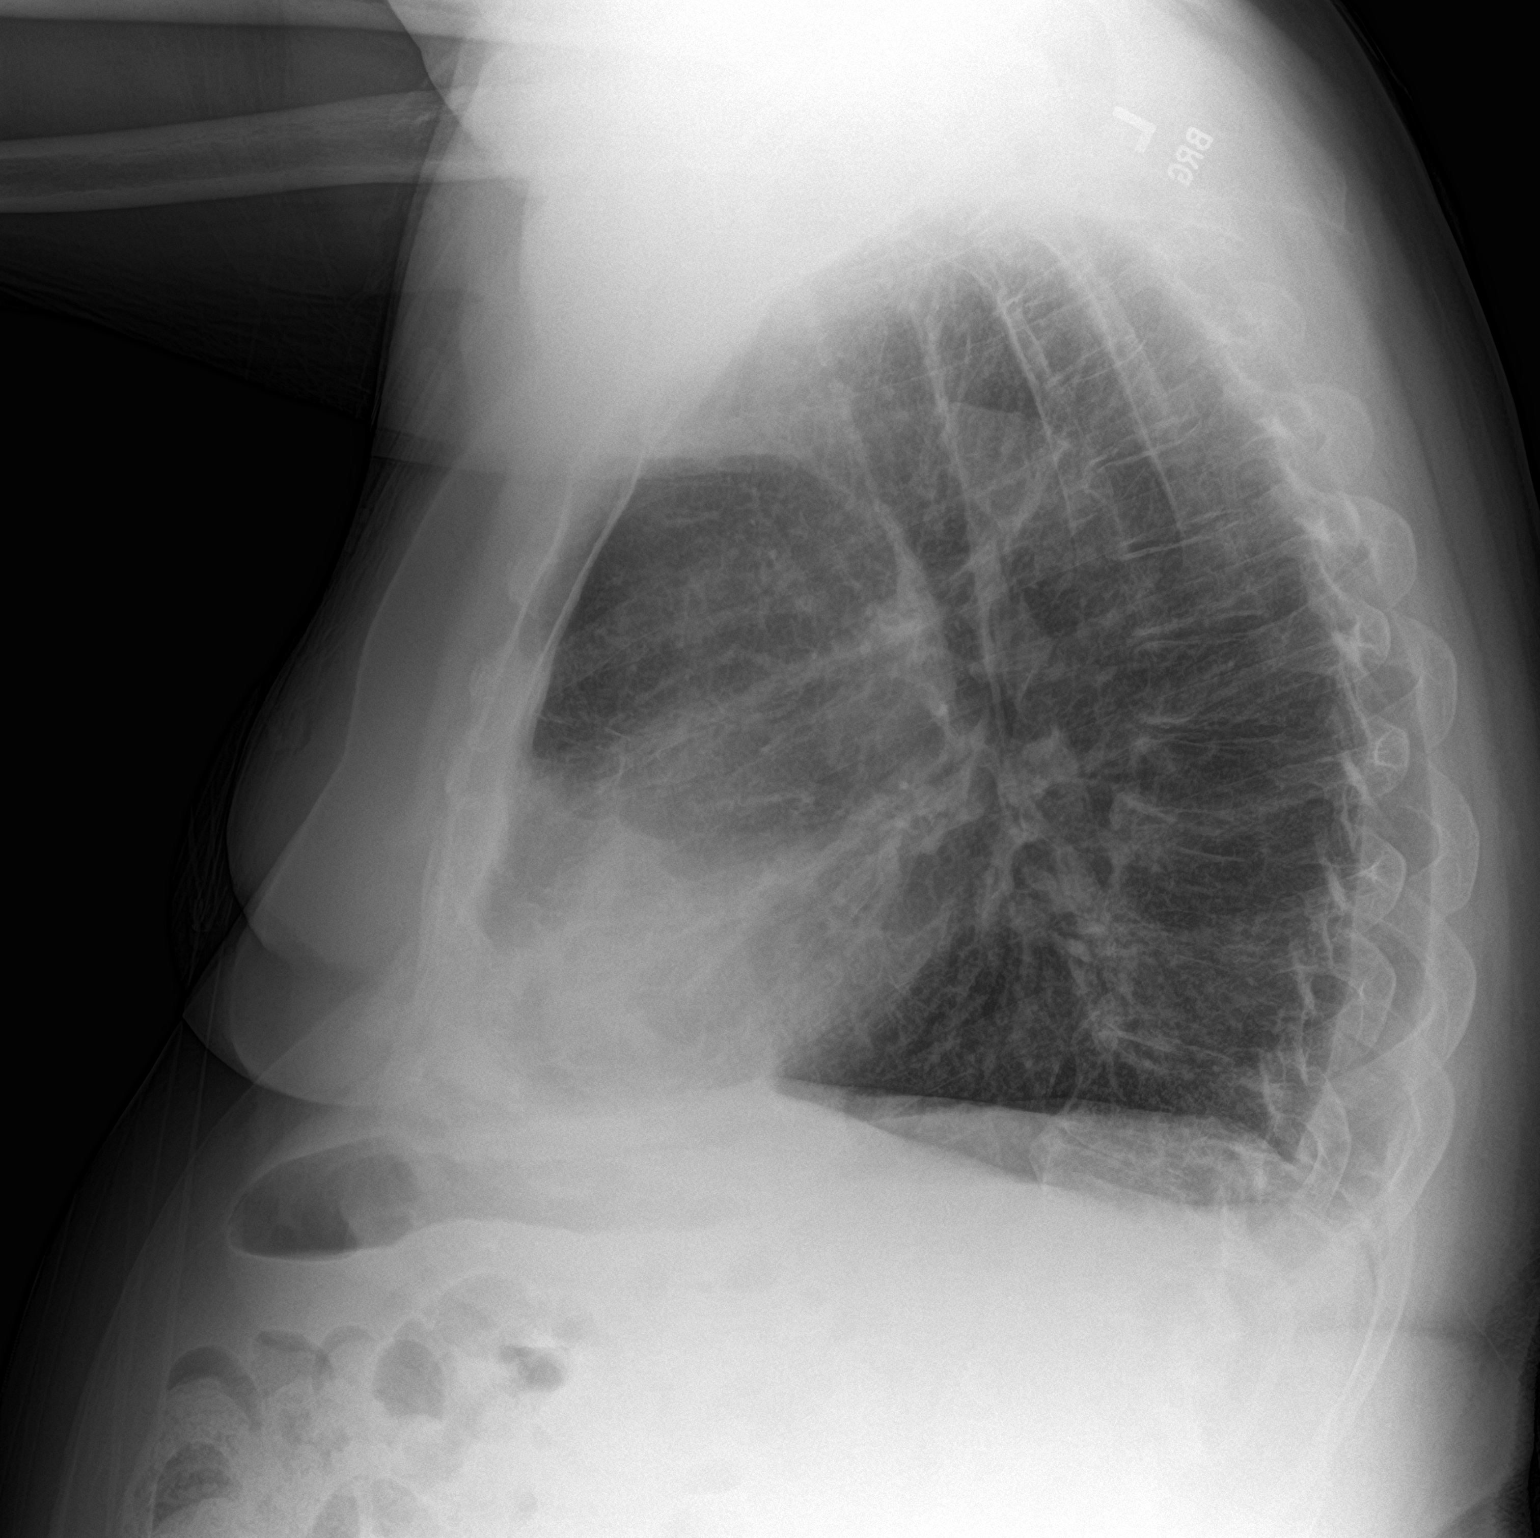

[2 of 2 positions shown; findings below may reference images not displayed]

FINDINGS: Frontal and lateral views of the chest demonstrate an unremarkable
cardiac silhouette. There is extensive background emphysema, without
acute airspace disease, effusion, or pneumothorax. Overlying the
right anterolateral third intercostal space there is a possible 12
mm nodule. Follow-up chest CT could be performed for further
evaluation. No acute bony abnormalities.
IMPRESSION: 1. Extensive emphysema and scarring, with possible 12 mm nodule in
the right upper lung zone. Follow-up chest CT may be useful for
further evaluation.

These results will be called to the ordering clinician or
representative by the Radiologist Assistant, and communication
documented in the PACS or [REDACTED].

## 2022-07-05 IMAGING — CT CT CHEST W/ CM
2 of 4 series · 15 of 36 positions shown, 18 images · IV contrast (Omnipaque or Isovue)
Comparison: 07/22/2020, 06/18/2020

CLINICAL DATA: Right upper lobe pulmonary nodule, pneumonia,
productive cough and worsening shortness of breath for several days

EXAM:
CT CHEST WITH CONTRAST
TECHNIQUE: Multidetector CT imaging of the chest was performed during
intravenous contrast administration.
CONTRAST:  75mL OMNIPAQUE IOHEXOL 300 MG/ML  SOLN

[Series 2: routine chest with · axial · 0.93mm/px · z∈[+952,+1248]mm · 12 of 174 slices shown, 15 images]
[im 13/174  mediastinal]
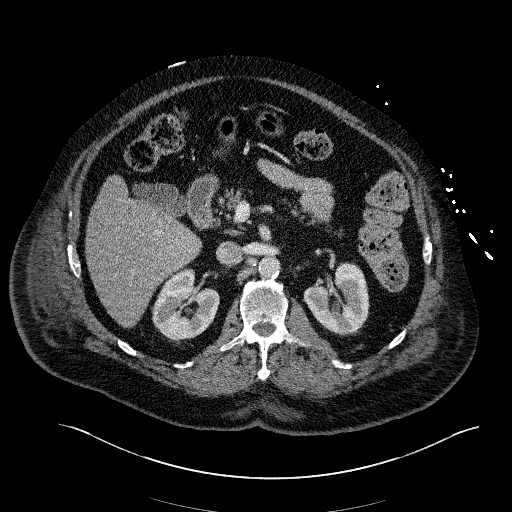
[im 13/174  lung]
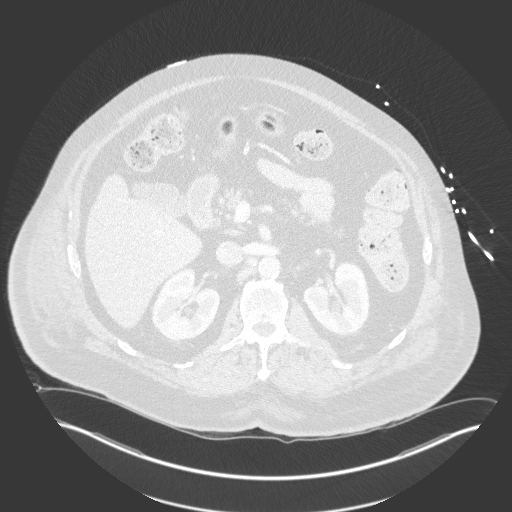
[im 25/174  lung]
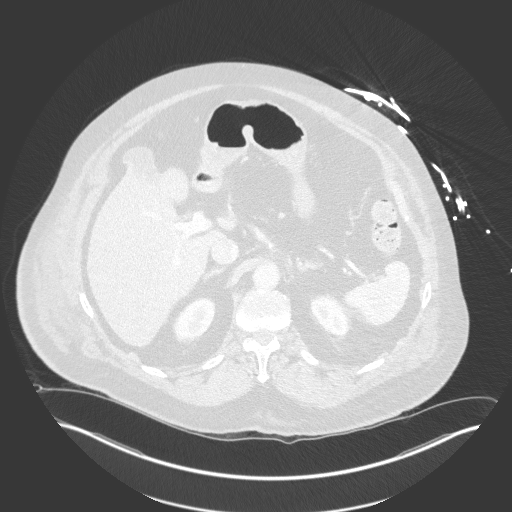
[im 38/174  lung]
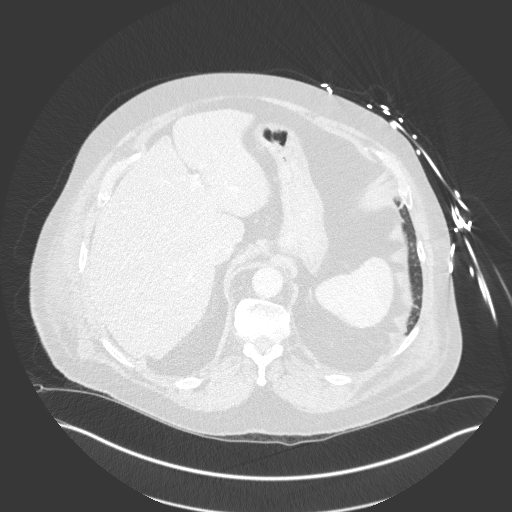
[im 50/174  lung]
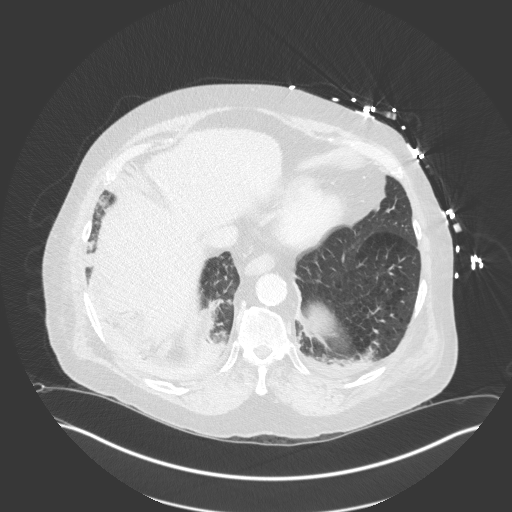
[im 62/174  mediastinal]
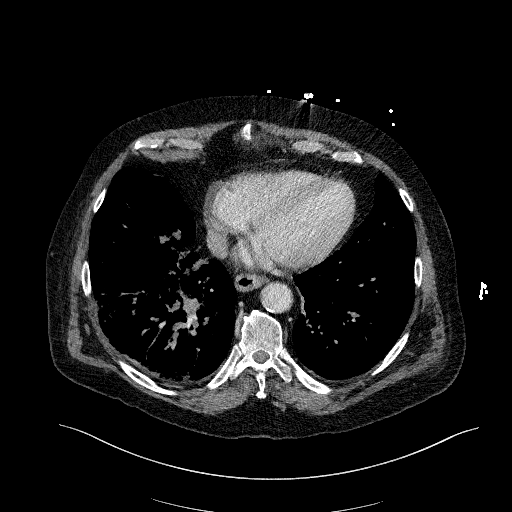
[im 62/174  lung]
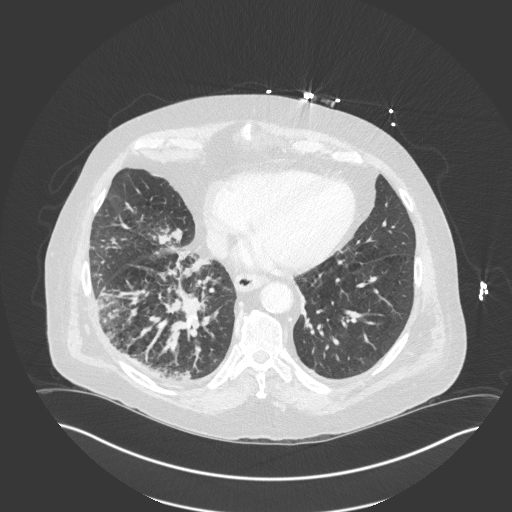
[im 75/174  lung]
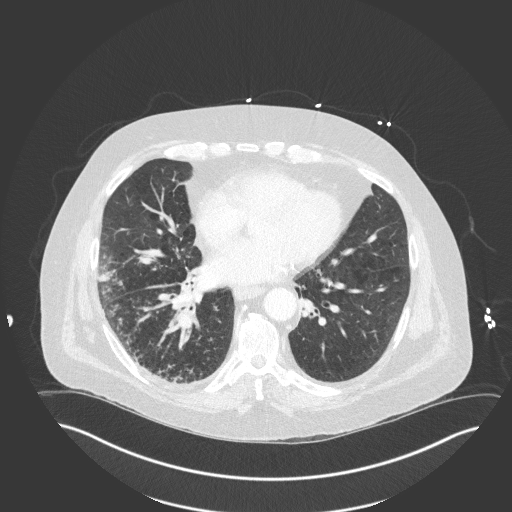
[im 99/174  lung]
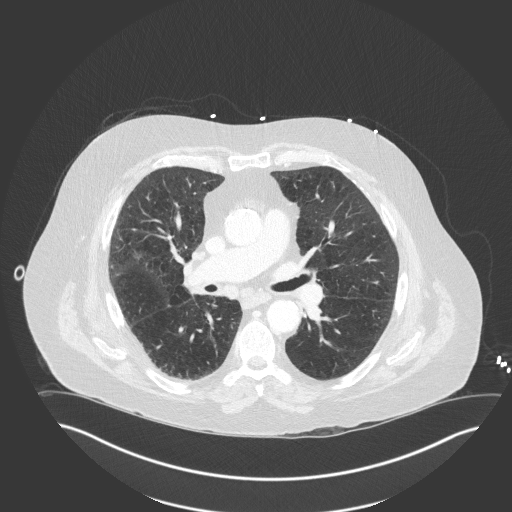
[im 112/174  lung]
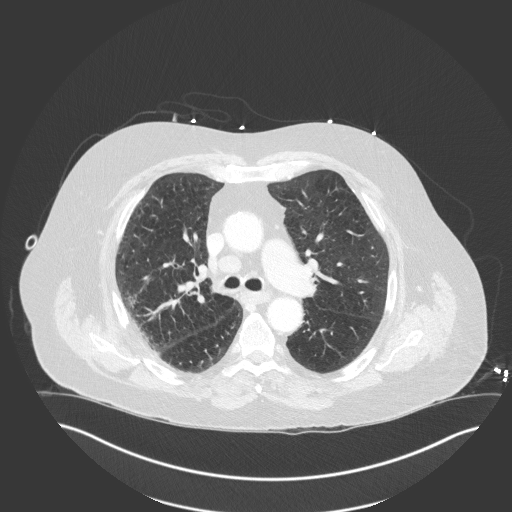
[im 124/174  mediastinal]
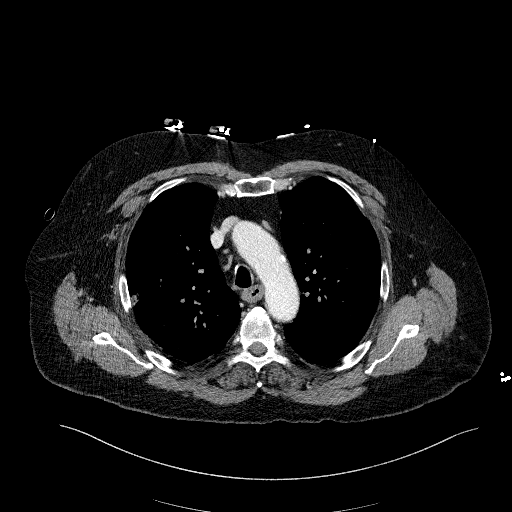
[im 124/174  lung]
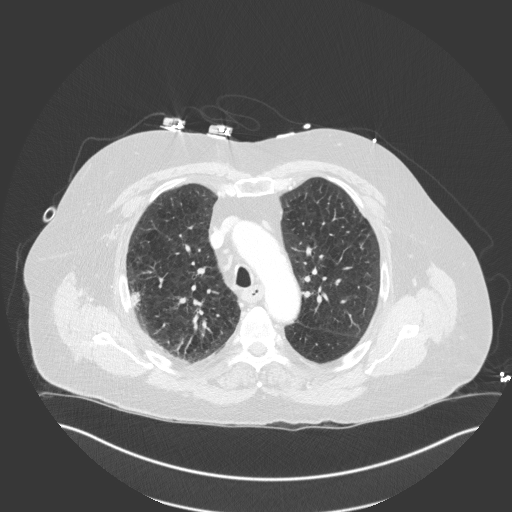
[im 136/174  lung]
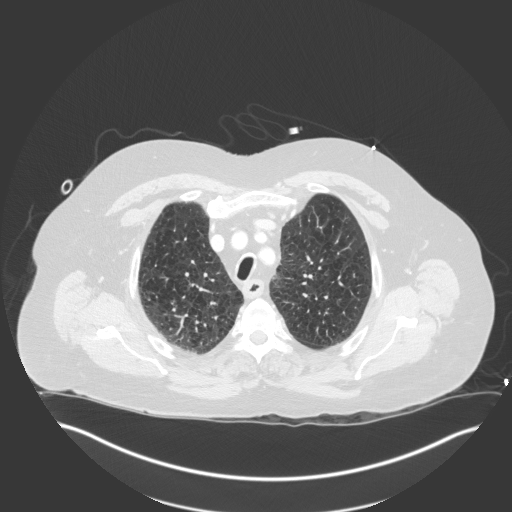
[im 149/174  lung]
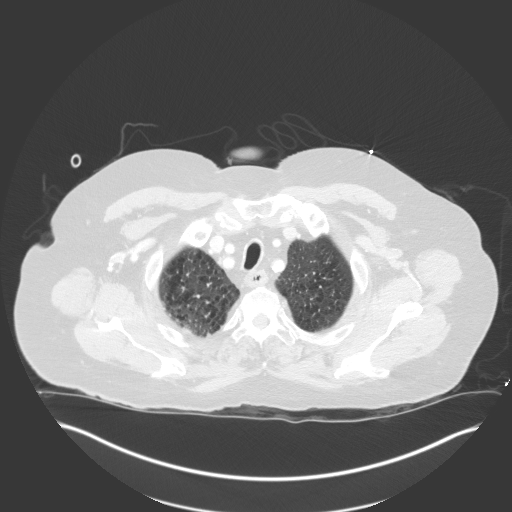
[im 161/174  lung]
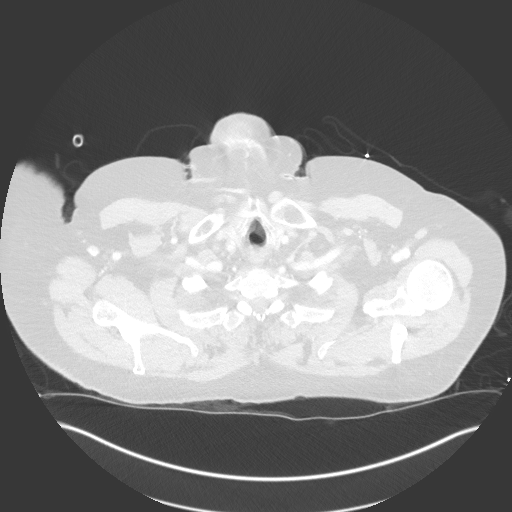

[Series 6: coronal · coronal · 0.74mm/px · 3 of 190 slices shown]
[im 38/190  lung]
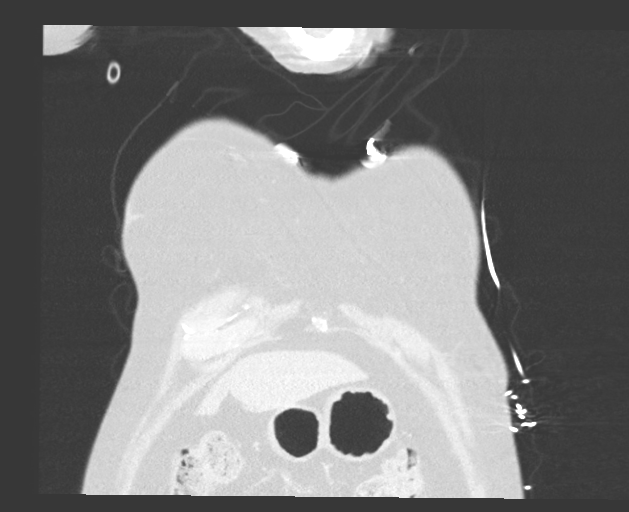
[im 76/190  lung]
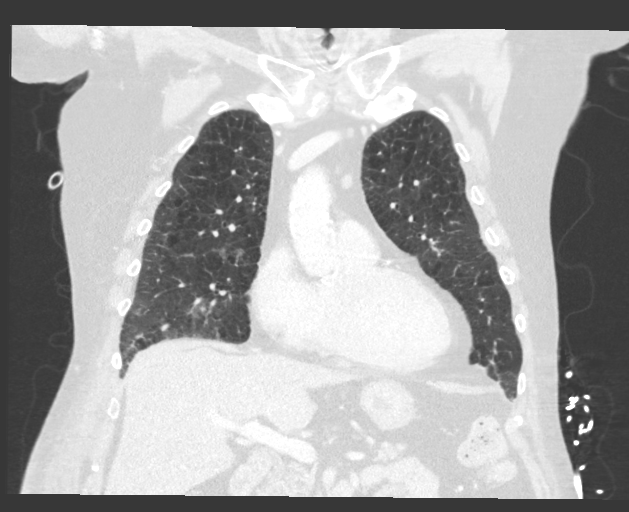
[im 114/190  lung]
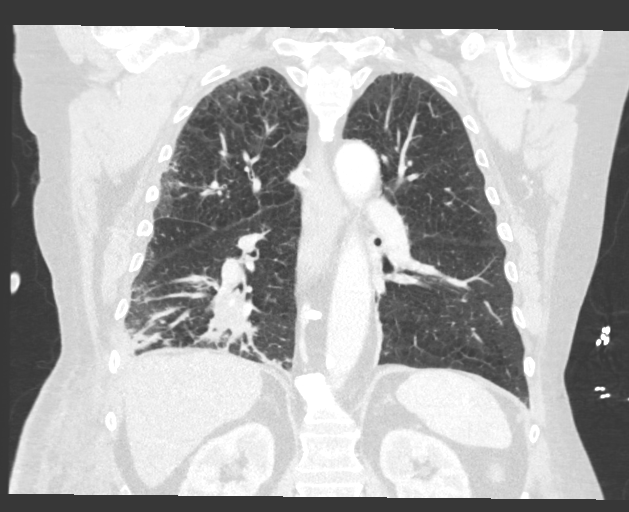

[15 of 36 positions shown; findings below may reference images not displayed]

FINDINGS: Cardiovascular: The heart and great vessels are unremarkable without
pericardial effusion. No evidence of thoracic aortic aneurysm or
dissection. Moderate atherosclerosis of the aorta. Extensive
atherosclerosis of the coronary vasculature, greatest in the LAD
distribution.

Mediastinum/Nodes: There is pathologic mediastinal and right hilar
adenopathy. Enlarged precarinal lymph node measuring 14 mm image
62/2 and right hilar lymph node measuring 14 mm image 69/2.

The thyroid and trachea are unremarkable. There is mild diffuse
esophageal wall thickening, nonspecific.

Lungs/Pleura: As seen on recent chest x-ray, there is a spiculated
right upper lobe pulmonary nodule, measuring 17 x 15 by 16 mm
reference image 50/5 and 109/6, highly concerning for malignancy.
There is a smaller satellite nodule more inferiorly within the right
upper lobe, measuring approximately 7 x 7 x 7 mm reference image
68/5 and 101/6, also suspicious for malignancy.

Multifocal airspace disease is seen within the right middle and
right lower lobes, with opacification of the distal bronchi. Overall
favor pneumonia over aspiration.

Upper lobe predominant emphysema again noted. No evidence of
effusion or pneumothorax.

Upper Abdomen: No acute abnormality.

Musculoskeletal: No acute or destructive bony lesions. Reconstructed
images demonstrate no additional findings.
IMPRESSION: 1. 16 mm mean diameter right upper lobe spiculated nodule, with
smaller 7 mm spiculated satellite nodule in the right upper lobe,
both concerning for malignancy. PET-CT recommended for further
evaluation.
2. Multifocal airspace disease within the right middle and right
lower lobes, consistent with pneumonia.
3. Mediastinal and right hilar lymphadenopathy. Metastatic disease
cannot be excluded.
4. Nonspecific diffuse esophageal wall thickening, which could
reflect esophagitis.
5. Aortic Atherosclerosis (V2BCD-GE4.4) and Emphysema (V2BCD-A34.P).

## 2022-07-05 IMAGING — DX DG CHEST 1V PORT
1 series · 1 of 1 positions shown · non-contrast
Comparison: 06/18/2020

CLINICAL DATA: Shortness of breath for 2 days

Hypertension
COPD
Former smoker
EXAM:
PORTABLE CHEST 1 VIEW

[chest ap]
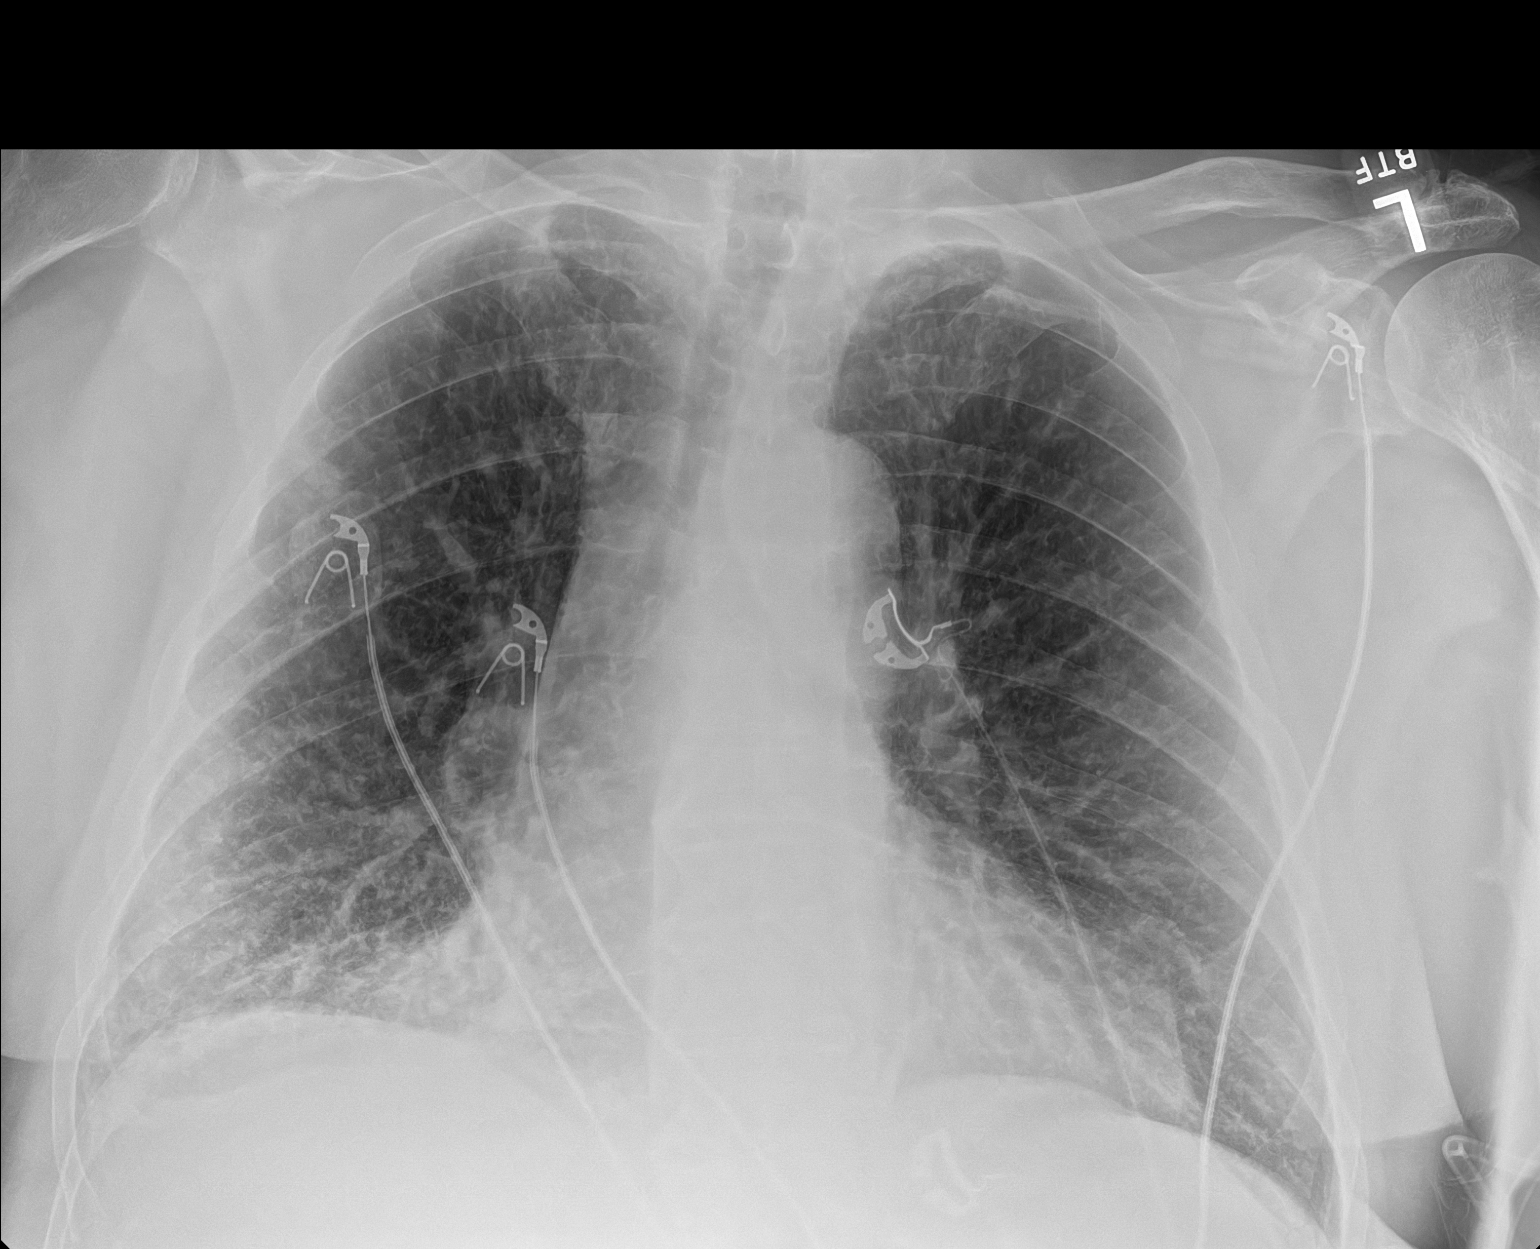

[1 of 1 positions shown; findings below may reference images not displayed]

FINDINGS: Cardiomediastinal silhouette and pulmonary vasculature are within
normal limits.

New airspace opacity at the right lung base suspicious for
pneumonia. Nodular opacity again seen in the right upper lung
measuring 1.2 cm.
IMPRESSION: 1. New airspace opacity at the right lung base suspicious for
pneumonia.
2. 1.2 cm nodule again seen in the right upper lung. Further
evaluation with contrast enhanced CT again recommended.

These results will be called to the ordering clinician or
representative by the Radiologist Assistant, and communication
documented in the PACS or [REDACTED].

## 2022-11-06 IMAGING — CR DG CHEST 1V
1 series · 1 of 1 positions shown · non-contrast
Comparison: 07/22/2020

CLINICAL DATA: Concern for osteomyelitis of the RIGHT foot. Recent
partial RIGHT foot amputation. Weakness.

EXAM:
CHEST  1 VIEW

[chest ap]
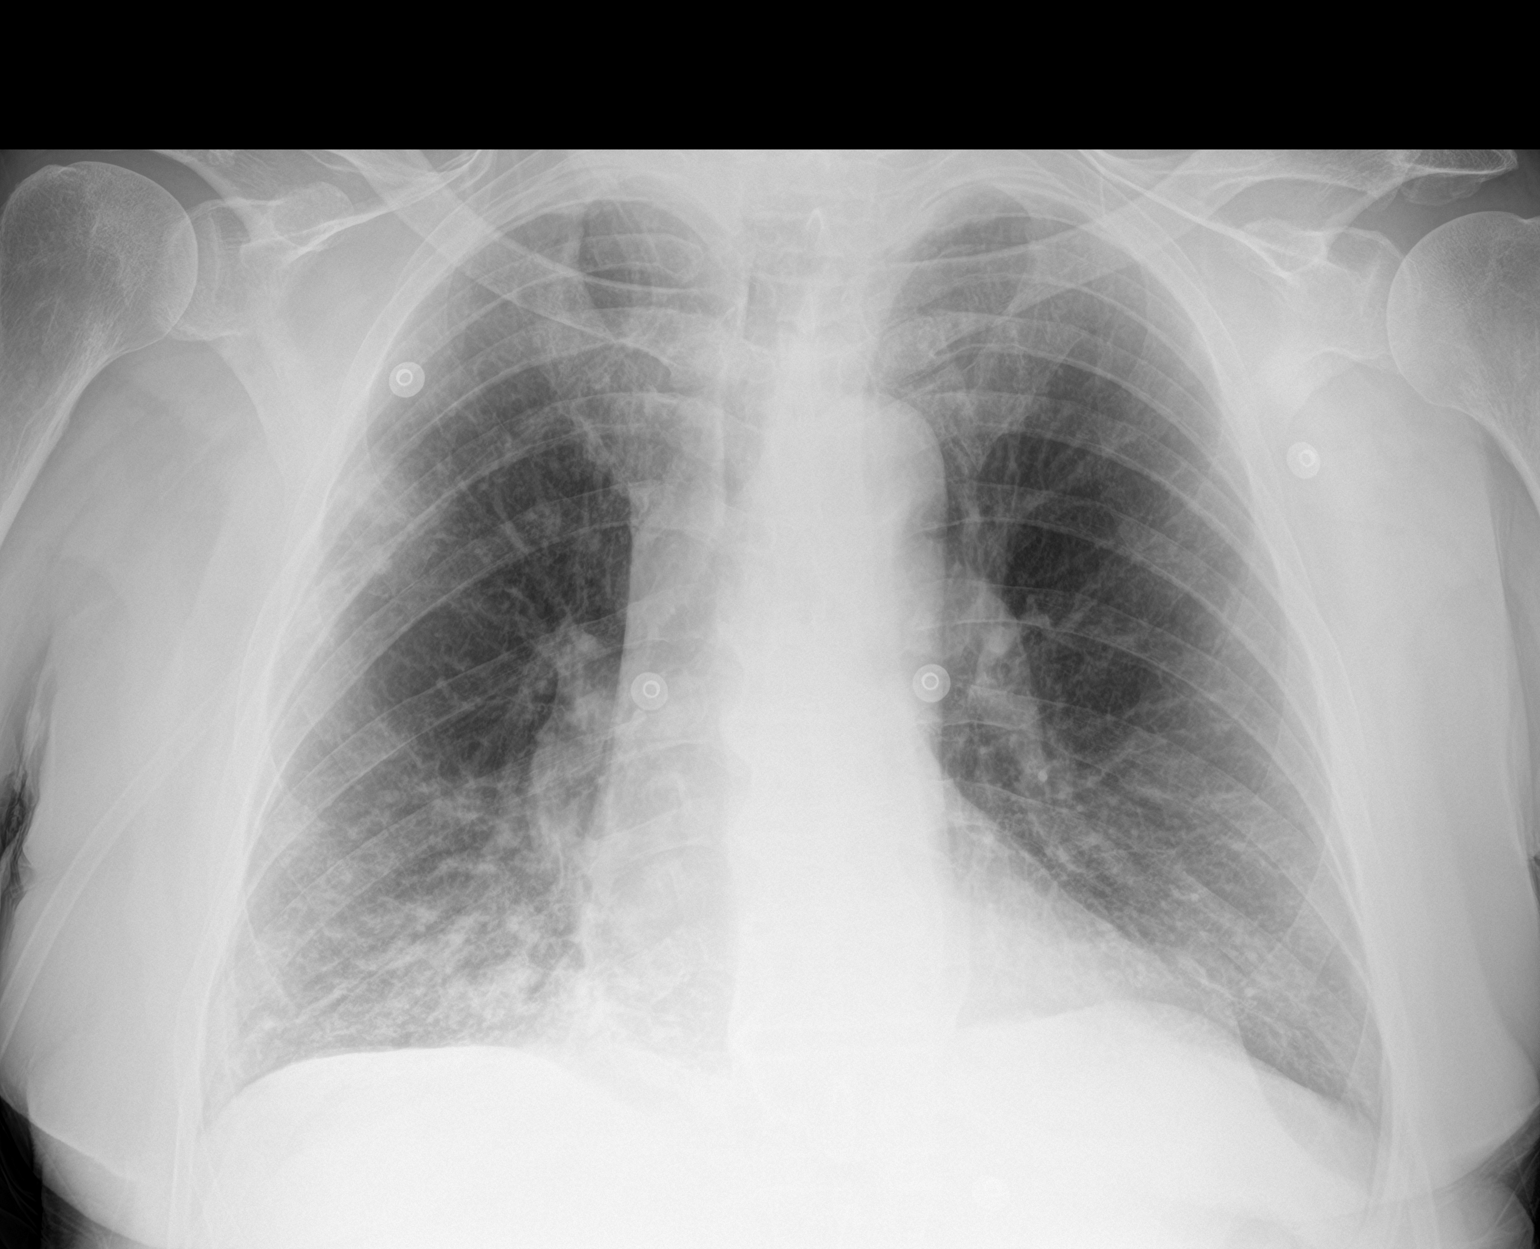

[1 of 1 positions shown; findings below may reference images not displayed]

FINDINGS: Stable cardiomegaly. Patchy opacity is identified in the RIGHT lung
base, consistent with infectious infiltrate. There is no pulmonary
edema.

Within the RIGHT UPPER lobe, a 1.5 centimeter spiculated mass and
adjacent satellite nodule are again noted.
IMPRESSION: 1. RIGHT LOWER lobe infiltrates.
2. RIGHT UPPER lobe masses, as seen on prior exams.
